# Patient Record
Sex: Female | Born: 1960 | Race: White | Hispanic: No | Marital: Married | Smoking: Never smoker
Health system: Southern US, Community
[De-identification: ages and names within clinical notes are randomized; demographics above are authoritative.]

## PROBLEM LIST (undated history)

## (undated) DIAGNOSIS — H8109 Meniere's disease, unspecified ear: Secondary | ICD-10-CM

## (undated) DIAGNOSIS — Z1379 Encounter for other screening for genetic and chromosomal anomalies: Principal | ICD-10-CM

## (undated) DIAGNOSIS — K529 Noninfective gastroenteritis and colitis, unspecified: Secondary | ICD-10-CM

## (undated) DIAGNOSIS — K859 Acute pancreatitis without necrosis or infection, unspecified: Secondary | ICD-10-CM

## (undated) DIAGNOSIS — C801 Malignant (primary) neoplasm, unspecified: Secondary | ICD-10-CM

## (undated) DIAGNOSIS — R197 Diarrhea, unspecified: Secondary | ICD-10-CM

## (undated) HISTORY — DX: Encounter for other screening for genetic and chromosomal anomalies: Z13.79

## (undated) HISTORY — PX: CERVICAL ABLATION: SHX5771

---

## 1997-08-24 HISTORY — PX: MANDIBLE SURGERY: SHX707

## 1997-12-24 ENCOUNTER — Other Ambulatory Visit: Admission: RE | Admit: 1997-12-24 | Discharge: 1997-12-24 | Payer: Self-pay

## 1998-01-01 ENCOUNTER — Ambulatory Visit (HOSPITAL_BASED_OUTPATIENT_CLINIC_OR_DEPARTMENT_OTHER): Admission: RE | Admit: 1998-01-01 | Discharge: 1998-01-01 | Payer: Self-pay | Admitting: Oral Surgery

## 1999-02-26 ENCOUNTER — Other Ambulatory Visit: Admission: RE | Admit: 1999-02-26 | Discharge: 1999-02-26 | Payer: Self-pay | Admitting: Obstetrics and Gynecology

## 1999-06-19 ENCOUNTER — Other Ambulatory Visit: Admission: RE | Admit: 1999-06-19 | Discharge: 1999-06-19 | Payer: Self-pay | Admitting: Obstetrics and Gynecology

## 1999-06-19 ENCOUNTER — Encounter (INDEPENDENT_AMBULATORY_CARE_PROVIDER_SITE_OTHER): Payer: Self-pay

## 1999-12-15 ENCOUNTER — Other Ambulatory Visit: Admission: RE | Admit: 1999-12-15 | Discharge: 1999-12-15 | Payer: Self-pay | Admitting: Obstetrics and Gynecology

## 2000-02-02 ENCOUNTER — Other Ambulatory Visit: Admission: RE | Admit: 2000-02-02 | Discharge: 2000-02-02 | Payer: Self-pay | Admitting: Obstetrics and Gynecology

## 2000-02-02 ENCOUNTER — Encounter (INDEPENDENT_AMBULATORY_CARE_PROVIDER_SITE_OTHER): Payer: Self-pay

## 2001-01-04 ENCOUNTER — Other Ambulatory Visit: Admission: RE | Admit: 2001-01-04 | Discharge: 2001-01-04 | Payer: Self-pay | Admitting: Obstetrics and Gynecology

## 2002-01-05 ENCOUNTER — Other Ambulatory Visit: Admission: RE | Admit: 2002-01-05 | Discharge: 2002-01-05 | Payer: Self-pay | Admitting: Obstetrics and Gynecology

## 2003-02-12 ENCOUNTER — Other Ambulatory Visit: Admission: RE | Admit: 2003-02-12 | Discharge: 2003-02-12 | Payer: Self-pay | Admitting: Obstetrics and Gynecology

## 2004-03-11 ENCOUNTER — Other Ambulatory Visit: Admission: RE | Admit: 2004-03-11 | Discharge: 2004-03-11 | Payer: Self-pay | Admitting: Obstetrics and Gynecology

## 2005-05-04 ENCOUNTER — Other Ambulatory Visit: Admission: RE | Admit: 2005-05-04 | Discharge: 2005-05-04 | Payer: Self-pay | Admitting: Obstetrics and Gynecology

## 2008-07-23 ENCOUNTER — Encounter: Admission: RE | Admit: 2008-07-23 | Discharge: 2008-07-23 | Payer: Self-pay | Admitting: Obstetrics and Gynecology

## 2009-08-24 HISTORY — PX: ENDOMETRIAL ABLATION: SHX621

## 2009-09-26 ENCOUNTER — Encounter: Admission: RE | Admit: 2009-09-26 | Discharge: 2009-09-26 | Payer: Self-pay | Admitting: Obstetrics and Gynecology

## 2009-12-27 ENCOUNTER — Ambulatory Visit (HOSPITAL_COMMUNITY): Admission: RE | Admit: 2009-12-27 | Discharge: 2009-12-27 | Payer: Self-pay | Admitting: Obstetrics and Gynecology

## 2010-09-14 ENCOUNTER — Encounter: Payer: Self-pay | Admitting: Obstetrics and Gynecology

## 2010-11-11 LAB — BASIC METABOLIC PANEL
Chloride: 106 mEq/L (ref 96–112)
Creatinine, Ser: 0.79 mg/dL (ref 0.4–1.2)
Potassium: 3.7 mEq/L (ref 3.5–5.1)
Sodium: 139 mEq/L (ref 135–145)

## 2010-11-11 LAB — CBC
HCT: 37.7 % (ref 36.0–46.0)
MCHC: 33.9 g/dL (ref 30.0–36.0)
RDW: 16 % — ABNORMAL HIGH (ref 11.5–15.5)

## 2011-11-03 ENCOUNTER — Other Ambulatory Visit: Payer: Self-pay | Admitting: Obstetrics and Gynecology

## 2011-11-03 DIAGNOSIS — N6320 Unspecified lump in the left breast, unspecified quadrant: Secondary | ICD-10-CM

## 2011-11-10 ENCOUNTER — Ambulatory Visit
Admission: RE | Admit: 2011-11-10 | Discharge: 2011-11-10 | Disposition: A | Discharge status: Home / Self Care | Payer: BC Managed Care – PPO | Source: Ambulatory Visit | Attending: Obstetrics and Gynecology | Admitting: Obstetrics and Gynecology

## 2011-11-10 DIAGNOSIS — N6320 Unspecified lump in the left breast, unspecified quadrant: Secondary | ICD-10-CM

## 2013-01-13 ENCOUNTER — Other Ambulatory Visit: Payer: Self-pay

## 2013-01-13 DIAGNOSIS — Z1231 Encounter for screening mammogram for malignant neoplasm of breast: Secondary | ICD-10-CM

## 2013-01-26 ENCOUNTER — Ambulatory Visit
Admission: RE | Admit: 2013-01-26 | Discharge: 2013-01-26 | Disposition: A | Discharge status: Home / Self Care | Payer: BC Managed Care – PPO | Source: Ambulatory Visit

## 2013-01-26 DIAGNOSIS — Z1231 Encounter for screening mammogram for malignant neoplasm of breast: Secondary | ICD-10-CM

## 2013-03-01 ENCOUNTER — Other Ambulatory Visit: Payer: Self-pay | Admitting: Dermatology

## 2013-03-29 ENCOUNTER — Other Ambulatory Visit: Payer: Self-pay | Admitting: Dermatology

## 2014-01-12 ENCOUNTER — Other Ambulatory Visit: Payer: Self-pay | Admitting: Family Medicine

## 2014-01-12 ENCOUNTER — Other Ambulatory Visit (HOSPITAL_COMMUNITY)
Admission: RE | Admit: 2014-01-12 | Discharge: 2014-01-12 | Disposition: A | Discharge status: Home / Self Care | Payer: BC Managed Care – PPO | Source: Ambulatory Visit | Attending: Family Medicine | Admitting: Family Medicine

## 2014-01-12 DIAGNOSIS — Z124 Encounter for screening for malignant neoplasm of cervix: Secondary | ICD-10-CM | POA: Insufficient documentation

## 2014-01-12 DIAGNOSIS — Z1151 Encounter for screening for human papillomavirus (HPV): Secondary | ICD-10-CM | POA: Insufficient documentation

## 2014-08-24 DIAGNOSIS — H8109 Meniere's disease, unspecified ear: Secondary | ICD-10-CM

## 2014-08-24 HISTORY — DX: Meniere's disease, unspecified ear: H81.09

## 2015-01-16 ENCOUNTER — Other Ambulatory Visit: Payer: Self-pay

## 2015-01-16 DIAGNOSIS — Z1231 Encounter for screening mammogram for malignant neoplasm of breast: Secondary | ICD-10-CM

## 2015-01-24 ENCOUNTER — Ambulatory Visit
Admission: RE | Admit: 2015-01-24 | Discharge: 2015-01-24 | Disposition: A | Discharge status: Home / Self Care | Payer: BLUE CROSS/BLUE SHIELD | Source: Ambulatory Visit

## 2015-01-24 ENCOUNTER — Encounter (INDEPENDENT_AMBULATORY_CARE_PROVIDER_SITE_OTHER): Payer: Self-pay

## 2015-01-24 DIAGNOSIS — Z1231 Encounter for screening mammogram for malignant neoplasm of breast: Secondary | ICD-10-CM

## 2015-07-30 ENCOUNTER — Other Ambulatory Visit: Payer: Self-pay | Admitting: Otolaryngology

## 2015-07-30 DIAGNOSIS — R42 Dizziness and giddiness: Secondary | ICD-10-CM

## 2015-07-30 DIAGNOSIS — H9311 Tinnitus, right ear: Secondary | ICD-10-CM

## 2015-07-30 DIAGNOSIS — H905 Unspecified sensorineural hearing loss: Secondary | ICD-10-CM

## 2015-07-30 DIAGNOSIS — H908 Mixed conductive and sensorineural hearing loss, unspecified: Secondary | ICD-10-CM

## 2015-07-30 DIAGNOSIS — H903 Sensorineural hearing loss, bilateral: Secondary | ICD-10-CM

## 2015-08-13 ENCOUNTER — Ambulatory Visit
Admission: RE | Admit: 2015-08-13 | Discharge: 2015-08-13 | Disposition: A | Discharge status: Home / Self Care | Payer: BLUE CROSS/BLUE SHIELD | Source: Ambulatory Visit | Attending: Otolaryngology | Admitting: Otolaryngology

## 2015-08-13 DIAGNOSIS — R42 Dizziness and giddiness: Secondary | ICD-10-CM

## 2015-08-13 DIAGNOSIS — H9311 Tinnitus, right ear: Secondary | ICD-10-CM

## 2015-08-13 DIAGNOSIS — H903 Sensorineural hearing loss, bilateral: Secondary | ICD-10-CM

## 2015-08-13 DIAGNOSIS — H908 Mixed conductive and sensorineural hearing loss, unspecified: Secondary | ICD-10-CM

## 2015-08-13 DIAGNOSIS — H905 Unspecified sensorineural hearing loss: Secondary | ICD-10-CM

## 2015-08-13 MED ORDER — GADOBENATE DIMEGLUMINE 529 MG/ML IV SOLN
13.0000 mL | Freq: Once | INTRAVENOUS | Status: AC | PRN
  Administered 2015-08-13: 13 mL via INTRAVENOUS

## 2015-10-21 DIAGNOSIS — IMO0001 Reserved for inherently not codable concepts without codable children: Secondary | ICD-10-CM | POA: Insufficient documentation

## 2015-10-21 DIAGNOSIS — H918X1 Other specified hearing loss, right ear: Secondary | ICD-10-CM | POA: Insufficient documentation

## 2015-10-21 DIAGNOSIS — H8101 Meniere's disease, right ear: Secondary | ICD-10-CM | POA: Diagnosis present

## 2015-10-21 DIAGNOSIS — R42 Dizziness and giddiness: Secondary | ICD-10-CM

## 2016-03-03 ENCOUNTER — Other Ambulatory Visit: Payer: Self-pay | Admitting: Family Medicine

## 2016-03-03 DIAGNOSIS — Z1231 Encounter for screening mammogram for malignant neoplasm of breast: Secondary | ICD-10-CM

## 2016-03-11 ENCOUNTER — Ambulatory Visit
Admission: RE | Admit: 2016-03-11 | Discharge: 2016-03-11 | Disposition: A | Discharge status: Home / Self Care | Payer: BLUE CROSS/BLUE SHIELD | Source: Ambulatory Visit | Attending: Family Medicine | Admitting: Family Medicine

## 2016-03-11 DIAGNOSIS — Z1231 Encounter for screening mammogram for malignant neoplasm of breast: Secondary | ICD-10-CM

## 2016-10-02 DIAGNOSIS — H918X1 Other specified hearing loss, right ear: Secondary | ICD-10-CM | POA: Diagnosis not present

## 2016-10-02 DIAGNOSIS — H8101 Meniere's disease, right ear: Secondary | ICD-10-CM | POA: Diagnosis not present

## 2016-10-02 DIAGNOSIS — H903 Sensorineural hearing loss, bilateral: Secondary | ICD-10-CM | POA: Diagnosis not present

## 2016-10-26 DIAGNOSIS — M545 Low back pain: Secondary | ICD-10-CM | POA: Diagnosis not present

## 2016-12-13 DIAGNOSIS — D1803 Hemangioma of intra-abdominal structures: Secondary | ICD-10-CM

## 2017-03-10 ENCOUNTER — Other Ambulatory Visit (HOSPITAL_COMMUNITY)
Admission: RE | Admit: 2017-03-10 | Discharge: 2017-03-10 | Disposition: A | Discharge status: Home / Self Care | Payer: 59 | Source: Ambulatory Visit | Attending: Family Medicine | Admitting: Family Medicine

## 2017-03-10 ENCOUNTER — Other Ambulatory Visit: Payer: Self-pay | Admitting: Family Medicine

## 2017-03-10 DIAGNOSIS — Z5181 Encounter for therapeutic drug level monitoring: Secondary | ICD-10-CM | POA: Diagnosis not present

## 2017-03-10 DIAGNOSIS — Z1151 Encounter for screening for human papillomavirus (HPV): Secondary | ICD-10-CM | POA: Insufficient documentation

## 2017-03-10 DIAGNOSIS — Z01411 Encounter for gynecological examination (general) (routine) with abnormal findings: Secondary | ICD-10-CM | POA: Diagnosis not present

## 2017-03-10 DIAGNOSIS — E785 Hyperlipidemia, unspecified: Secondary | ICD-10-CM | POA: Diagnosis not present

## 2017-03-10 DIAGNOSIS — Z0000 Encounter for general adult medical examination without abnormal findings: Secondary | ICD-10-CM | POA: Diagnosis not present

## 2017-03-12 LAB — CYTOLOGY - PAP
DIAGNOSIS: NEGATIVE
HPV (WINDOPATH): NOT DETECTED

## 2017-03-18 ENCOUNTER — Other Ambulatory Visit: Payer: Self-pay | Admitting: Family Medicine

## 2017-03-18 DIAGNOSIS — Z1231 Encounter for screening mammogram for malignant neoplasm of breast: Secondary | ICD-10-CM

## 2017-03-25 ENCOUNTER — Ambulatory Visit
Admission: RE | Admit: 2017-03-25 | Discharge: 2017-03-25 | Disposition: A | Discharge status: Home / Self Care | Payer: 59 | Source: Ambulatory Visit | Attending: Family Medicine | Admitting: Family Medicine

## 2017-03-25 DIAGNOSIS — Z1231 Encounter for screening mammogram for malignant neoplasm of breast: Secondary | ICD-10-CM | POA: Diagnosis not present

## 2017-03-30 ENCOUNTER — Other Ambulatory Visit: Payer: Self-pay | Admitting: Family Medicine

## 2017-03-30 DIAGNOSIS — R928 Other abnormal and inconclusive findings on diagnostic imaging of breast: Secondary | ICD-10-CM

## 2017-04-01 ENCOUNTER — Ambulatory Visit
Admission: RE | Admit: 2017-04-01 | Discharge: 2017-04-01 | Disposition: A | Discharge status: Home / Self Care | Payer: 59 | Source: Ambulatory Visit | Attending: Family Medicine | Admitting: Family Medicine

## 2017-04-01 ENCOUNTER — Other Ambulatory Visit: Payer: Self-pay | Admitting: Family Medicine

## 2017-04-01 DIAGNOSIS — R928 Other abnormal and inconclusive findings on diagnostic imaging of breast: Secondary | ICD-10-CM

## 2017-04-01 DIAGNOSIS — R921 Mammographic calcification found on diagnostic imaging of breast: Secondary | ICD-10-CM

## 2017-04-01 DIAGNOSIS — N6489 Other specified disorders of breast: Secondary | ICD-10-CM | POA: Diagnosis not present

## 2017-04-01 DIAGNOSIS — R922 Inconclusive mammogram: Secondary | ICD-10-CM | POA: Diagnosis not present

## 2017-05-06 DIAGNOSIS — D2272 Melanocytic nevi of left lower limb, including hip: Secondary | ICD-10-CM | POA: Diagnosis not present

## 2017-05-06 DIAGNOSIS — Z86018 Personal history of other benign neoplasm: Secondary | ICD-10-CM | POA: Diagnosis not present

## 2017-05-06 DIAGNOSIS — L814 Other melanin hyperpigmentation: Secondary | ICD-10-CM | POA: Diagnosis not present

## 2017-07-20 DIAGNOSIS — R945 Abnormal results of liver function studies: Secondary | ICD-10-CM | POA: Diagnosis not present

## 2017-07-20 DIAGNOSIS — R1013 Epigastric pain: Secondary | ICD-10-CM | POA: Diagnosis not present

## 2017-07-21 ENCOUNTER — Other Ambulatory Visit: Payer: Self-pay | Admitting: Family Medicine

## 2017-07-21 DIAGNOSIS — R1011 Right upper quadrant pain: Secondary | ICD-10-CM

## 2017-07-22 ENCOUNTER — Other Ambulatory Visit: Payer: 59

## 2017-07-23 ENCOUNTER — Ambulatory Visit
Admission: RE | Admit: 2017-07-23 | Discharge: 2017-07-23 | Disposition: A | Discharge status: Home / Self Care | Payer: 59 | Source: Ambulatory Visit | Attending: Family Medicine | Admitting: Family Medicine

## 2017-07-23 ENCOUNTER — Other Ambulatory Visit: Payer: Self-pay | Admitting: Family Medicine

## 2017-07-23 DIAGNOSIS — R748 Abnormal levels of other serum enzymes: Secondary | ICD-10-CM

## 2017-07-23 DIAGNOSIS — R1013 Epigastric pain: Secondary | ICD-10-CM | POA: Diagnosis not present

## 2017-07-23 DIAGNOSIS — K8020 Calculus of gallbladder without cholecystitis without obstruction: Secondary | ICD-10-CM | POA: Diagnosis not present

## 2017-07-23 DIAGNOSIS — R160 Hepatomegaly, not elsewhere classified: Secondary | ICD-10-CM

## 2017-07-23 DIAGNOSIS — R1011 Right upper quadrant pain: Secondary | ICD-10-CM

## 2017-07-23 MED ORDER — IOPAMIDOL (ISOVUE-300) INJECTION 61%
100.0000 mL | Freq: Once | INTRAVENOUS | Status: AC | PRN
  Administered 2017-07-23: 100 mL via INTRAVENOUS

## 2017-07-27 ENCOUNTER — Other Ambulatory Visit (HOSPITAL_COMMUNITY): Payer: Self-pay | Admitting: Family Medicine

## 2017-07-27 DIAGNOSIS — K8020 Calculus of gallbladder without cholecystitis without obstruction: Secondary | ICD-10-CM | POA: Diagnosis not present

## 2017-07-27 DIAGNOSIS — R160 Hepatomegaly, not elsewhere classified: Secondary | ICD-10-CM

## 2017-07-27 DIAGNOSIS — C799 Secondary malignant neoplasm of unspecified site: Secondary | ICD-10-CM

## 2017-07-27 DIAGNOSIS — K859 Acute pancreatitis without necrosis or infection, unspecified: Secondary | ICD-10-CM | POA: Diagnosis not present

## 2017-07-28 ENCOUNTER — Telehealth: Payer: Self-pay | Admitting: Hematology

## 2017-07-28 NOTE — Telephone Encounter (Signed)
Appt has been scheduled for the pt to see Dr. Burr Medico on 12/7 at 230pm. Pt aware to arrive 30 minutes early to be checked in on time. Voiced understanding and has agreed to the appt date and time.

## 2017-07-29 ENCOUNTER — Encounter (HOSPITAL_COMMUNITY)
Admission: RE | Admit: 2017-07-29 | Discharge: 2017-07-29 | Disposition: A | Discharge status: Home / Self Care | Payer: 59 | Source: Ambulatory Visit | Attending: Family Medicine | Admitting: Family Medicine

## 2017-07-29 DIAGNOSIS — R160 Hepatomegaly, not elsewhere classified: Secondary | ICD-10-CM | POA: Diagnosis not present

## 2017-07-29 DIAGNOSIS — C259 Malignant neoplasm of pancreas, unspecified: Secondary | ICD-10-CM | POA: Insufficient documentation

## 2017-07-29 DIAGNOSIS — C799 Secondary malignant neoplasm of unspecified site: Secondary | ICD-10-CM | POA: Insufficient documentation

## 2017-07-29 DIAGNOSIS — C787 Secondary malignant neoplasm of liver and intrahepatic bile duct: Secondary | ICD-10-CM | POA: Diagnosis not present

## 2017-07-29 LAB — GLUCOSE, CAPILLARY: GLUCOSE-CAPILLARY: 117 mg/dL — AB (ref 65–99)

## 2017-07-29 MED ORDER — FLUDEOXYGLUCOSE F - 18 (FDG) INJECTION
6.5000 | Freq: Once | INTRAVENOUS | Status: AC | PRN
  Administered 2017-07-29: 6.5 via INTRAVENOUS

## 2017-07-30 ENCOUNTER — Telehealth: Payer: Self-pay | Admitting: Hematology

## 2017-07-30 ENCOUNTER — Ambulatory Visit (HOSPITAL_BASED_OUTPATIENT_CLINIC_OR_DEPARTMENT_OTHER): Payer: 59 | Admitting: Hematology

## 2017-07-30 ENCOUNTER — Encounter: Payer: Self-pay | Admitting: General Practice

## 2017-07-30 ENCOUNTER — Encounter: Payer: Self-pay | Admitting: Hematology

## 2017-07-30 DIAGNOSIS — H8109 Meniere's disease, unspecified ear: Secondary | ICD-10-CM | POA: Diagnosis not present

## 2017-07-30 DIAGNOSIS — R21 Rash and other nonspecific skin eruption: Secondary | ICD-10-CM | POA: Diagnosis not present

## 2017-07-30 DIAGNOSIS — K859 Acute pancreatitis without necrosis or infection, unspecified: Secondary | ICD-10-CM | POA: Diagnosis not present

## 2017-07-30 DIAGNOSIS — Z807 Family history of other malignant neoplasms of lymphoid, hematopoietic and related tissues: Secondary | ICD-10-CM

## 2017-07-30 DIAGNOSIS — C787 Secondary malignant neoplasm of liver and intrahepatic bile duct: Secondary | ICD-10-CM

## 2017-07-30 DIAGNOSIS — Z800 Family history of malignant neoplasm of digestive organs: Secondary | ICD-10-CM | POA: Diagnosis not present

## 2017-07-30 DIAGNOSIS — C801 Malignant (primary) neoplasm, unspecified: Secondary | ICD-10-CM

## 2017-07-30 NOTE — Progress Notes (Signed)
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 Cholelithiasis.  No secondary signs of acute cholecystitis. 2. Multiple solid liver masses measuring up to 6.5 cm in the left lobe of the liver, evaluation for metastatic disease is recommended. These results will be called to the ordering clinician or representative by the Radiologist Assistant, and communication documented in the PACS or zVision Dashboard.      07/23/2017 Imaging    CT Abdomen W Contrast 07/23/17 IMPRESSION: 1. Widespread metastatic disease throughout the liver. No clear primary malignancy identified in the abdomen. The pelvis was not imaged. Tissue sampling recommended. 2. Probable adenopathy superior to the pancreatic tail. No evidence of pancreatic mass. 3. Suspected incidental hemangioma inferiorly in the right hepatic lobe. 4. Nonspecific nodularity in the breasts. The patient has undergone recent (03/25/2017 and 04/01/2017) mammography and ultrasound.      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 Numerous bulky liver masses are hypermetabolic compatible with malignancy. 2. Accentuated activity within or along the pancreatic tail. I suspect that this is probably a physiologic activity in the distal duodenum which is being is registered onto the pancreatic tail. I do not see an obvious pancreatic lesion on the CT scan. Consider pancreatic protocol MRI to further work this up. 3. The peripancreatic lymph node shown  above the pancreatic tail is mildly hypermetabolic favoring malignancy. 4.  Prominent stool throughout the colon favors constipation. 5. 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        HISTORY OF PRESENTING ILLNESS: 07/30/17  Morgan Dawson 56 y.o. 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 The patient was referred by her PCP Dr. [name]. The patient presents to the clinic today accompanied by husband.  Today the patient reports the weekend before Thanksgiving she had abdominal issues with bloating and cramps. These symptoms worsened after she would eat. This persisted past Thanksgiving so she went to her PCP.  She presented to her PCP on 07/20/17 for upper abdominal pain, nausea and bloating. She had lab work up and RUQ [place] on 07/23/17. Results showed elevated LFTs (alk phos 227, AST 191, ALT 275) and US showed multiple masses in her liver and pancreatitis. She was put on a bland die due to her pancreatitis. CT AP in 07/23/17 showed diffuse metastatic disease in her liver with no clear primary. She had a PET scan that shows possible metastasis to peripancreatic lymph node along with her known liver masses.   Today she notes she has focused soreness in RUQ. She feels nauseous and has vomited 3 times total in the past 3 weeks. She has lost weight, initially purposefully. She has lost 5-10 pounds. She has been constipated  lately. She takes dulcolax to help. She denies dark stool or GI bleeding. No hematemesis. She does feel more fatigued. She was relatively active 4-5 days a week before last month. She has not needed medication as she feels she has more  discomfort (1-2/10). She has not been exercising because she is afraid of making things worse. Her appetite is low from her discomfort and nausea. She notes multiple tender knots on her lower legs that appeared 5 nights ago. There is no itching. She notes she will follow up with Dr. Paulita Fujita this month for  her pancreatitis.   Her last mammogram at the [facility] was 04/01/17 and mostly benign. She was recommended to repeat in 6 months. She notes she frequently has cyst in her breast which were benign before.  Socially she is not working. She usually is active in the gym 3-4 days a week. She has 2 children (son and daughter) at 54yo and 57yo.   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 Her last episode of vertigo was a few months ago. She takes a diuretic and a low salt diet. She has jaw surgery for TMJ issues. She had endometrial ablation due to menorrhagia in 2011. She has only spotted since, no full period. Patient had a colonoscopy in 2014 with one benign polyp removed. Her father had colon cancer in his 59s. Her maternal grandfather had colon cancer in his 50s. Paternal aunt had lymphoma.   MEDICAL HISTORY:  History reviewed. No pertinent past medical history.  SURGICAL HISTORY: Past Surgical History:  Procedure Laterality Date  . ENDOMETRIAL ABLATION  2011  . MANDIBLE SURGERY  1999    SOCIAL HISTORY: Social History   Socioeconomic History  . Marital status: Married    Spouse name: Not on file  . Number of children: Not on file  . Years of education: Not on file  . Highest education level: Not on file  Social Needs  . Financial resource strain: Not on file  . Food insecurity - worry: Not on file  . Food insecurity - inability: Not on file  . Transportation needs - medical: Not on file  . Transportation needs - non-medical: Not on file  Occupational History  . Not on file  Tobacco Use  . Smoking status: Never Smoker  . Smokeless tobacco: Never Used  Substance and Sexual Activity  . Alcohol use: No    Frequency: Never  . Drug use: Not on file  . Sexual activity: Not on file  Other Topics Concern  . Not on file  Social History Narrative  . Not on file    FAMILY HISTORY: Family History  Problem Relation Age of Onset  . Cancer Father 39       colon cancer    . Cancer Maternal Aunt        lymphoma   . Cancer Maternal Grandfather 53       colon cancer   . Cancer Cousin        breast cancer     ALLERGIES:  has No Known Allergies.  MEDICATIONS:  Current Outpatient Medications  Medication Sig Dispense Refill  . ondansetron (ZOFRAN-ODT) 8 MG disintegrating tablet DIS 1 T ON THE TONGUE BID FOR 5 DAYS PRF NAUSEA  1  . triamterene-hydrochlorothiazide (DYAZIDE) 37.5-25 MG capsule TAKE 1 CAPSULE BY MOUTH  DAILY    . Acetaminophen-Codeine 300-30 MG tablet     . Meclizine HCl 25 MG CHEW Chew 25 mg by mouth as needed.     Marland Kitchen omeprazole (PRILOSEC) 40 MG capsule TAKE 1 CAPSULE ONCE A DAY IN THE MORNING  11  . XANAX 0.25 MG tablet Take 0.25 mg by mouth as needed.      No current facility-administered medications  for this visit.     REVIEW OF SYSTEMS:   Constitutional: Denies fevers, chills or abnormal night sweats (+) fatigue  (+) low appetite (+) weight loss Eyes: Denies blurriness of vision, double vision or watery eyes Ears, nose, mouth, throat, and face: Denies mucositis or sore throat Respiratory: Denies cough, dyspnea or wheezes Cardiovascular: Denies palpitation, chest discomfort or lower extremity swelling Gastrointestinal:  (+) Pain/discomfort in RUQ (1-2/10) (+) nausea (+) occasional emesis (+) constipation Skin: Denies abnormal skin rashes (+) tender knots down lower legs bilaterally Lymphatics: Denies new lymphadenopathy or easy bruising Neurological:Denies numbness, tingling or new weaknesses Behavioral/Psych: Mood is stable, no new changes  All other systems were reviewed with the patient and are negative.  PHYSICAL EXAMINATION: ECOG PERFORMANCE STATUS: 1 - Symptomatic but completely ambulatory  Vitals:   07/30/17 1428  BP: 134/82  Pulse: 87  Resp: 18  Temp: 97.9 F (36.6 C)  SpO2: 100%   Filed Weights   07/30/17 1428  Weight: 137 lb 14.4 oz (62.6 kg)    GENERAL:alert, no distress and comfortable SKIN: skin color,  texture, turgor are normal, no rashes or significant lesions EYES: normal, conjunctiva are pink and non-injected, sclera clear OROPHARYNX:no exudate, no erythema and lips, buccal mucosa, and tongue normal  NECK: supple, thyroid normal size, non-tender, without nodularity LYMPH:  no palpable lymphadenopathy in the cervical, axillary or inguinal LUNGS: clear to auscultation and percussion with normal breathing effort HEART: regular rate & rhythm and no murmurs and no lower extremity edema ABDOMEN:abdomen soft, normal bowel sounds (+) hepatomegaly about 3cm below ribcage with tenderness on palpation  Musculoskeletal:no cyanosis of digits and no clubbing  PSYCH: alert & oriented x 3 with fluent speech NEURO: no focal motor/sensory deficits Breasts: Breast inspection showed them to be symmetrical with no nipple discharge. Palpation of the breasts and axilla revealed no obvious mass that I could appreciate.  LABORATORY DATA:  I have reviewed the data as listed CBC Latest Ref Rng & Units 12/25/2009  WBC 4.0 - 10.5 K/uL 5.9  Hemoglobin 12.0 - 15.0 g/dL 12.8  Hematocrit 36.0 - 46.0 % 37.7  Platelets 150 - 400 K/uL 292    CMP Latest Ref Rng & Units 12/25/2009  Glucose 70 - 99 mg/dL 98  BUN 6 - 23 mg/dL 16  Creatinine 0.4 - 1.2 mg/dL 0.79  Sodium 135 - 145 mEq/L 139  Potassium 3.5 - 5.1 mEq/L 3.7  Chloride 96 - 112 mEq/L 106  CO2 19 - 32 mEq/L 27  Calcium 8.4 - 10.5 mg/dL 9.5   Her recent outside lab (07/20/2017) WBC 7.7, hemoglobin 14.4, hematocrit 43%, platelets 374K, MCV 91.8 BUN 17, creatinine 0.88, GFR 67, sodium 136, potassium 3.3, calcium 11.0, total protein 7.8, albumin 4.4, total bilirubin 0.7, ALP 227, AST 191, ALT 275,  lipase 2969  PATHOLOGY  Diagnosis 03/07/13 Surgical, cecum, polyp -TUBULAR ADENOMA -NEGATIVE FOR HIGH GRADE DYSPLASIA   PROCEDURES  Colonoscopy by Dr. Paulita Fujita 03/07/13 IMPRESSION:  -One 5 m polyp in the cecum. Resected and retrieved -The distal rectum and  anal verge are normal on retroflexion view.  -The examination was otherwise normal.    RADIOGRAPHIC STUDIES: I have personally reviewed the radiological images as listed and agreed with the findings in the report. Nm Pet Image Initial (pi) Skull Base To Thigh  Result Date: 07/29/2017 CLINICAL DATA:  Initial treatment strategy for metastatic masses in the liver, primary uncertain. EXAM: NUCLEAR MEDICINE PET SKULL BASE TO THIGH TECHNIQUE: 6.5 mCi F-18 FDG was injected intravenously.  Full-ring PET imaging was performed from the skull base to thigh after the radiotracer. CT data was obtained and used for attenuation correction and anatomic localization. FASTING BLOOD GLUCOSE:  Value: 117 mg/dl COMPARISON:  CT scan 09/22/2016 FINDINGS: NECK No hypermetabolic lymph nodes in the neck. Several small hypodense thyroid nodules do not require further workup. CHEST No hypermetabolic mediastinal or hilar nodes. No suspicious pulmonary nodules on the CT data. Mild biapical pleuroparenchymal scarring. ABDOMEN/PELVIS Replacement of much of the liver by hypermetabolic masses with hypermetabolic activity ranging up to 9.8. An index 6.0 by 0.3 cm lateral segment left hepatic lobe mass on image 101/4 has a maximum SUV of 7.4. Peripancreatic node just above the pancreatic tail measures 1.3 cm in short axis and has a maximum standard uptake value of 4.4. There is some activity projecting over the junction of the pancreatic body and tail with maximum SUV 6.3, but on the PET images this seems connected to the third portion of the duodenum and [place] may represent bowel activity is registered along the pancreas rather than a true pancreatic lesion. The spleen and adrenal glands appear normal. No additional hypermetabolic adenopathy is identified. More caudad, the bowel appears normal. Prominent stool throughout the colon favors constipation. 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 No significant abnormal osseous activity.  IMPRESSION: 1. Numerous bulky liver masses are hypermetabolic compatible with malignancy. 2. Accentuated activity within or along the pancreatic tail. I suspect that this is probably a physiologic activity in the distal duodenum which is being is registered onto the pancreatic tail. I do not see an obvious pancreatic lesion on the CT scan. Consider pancreatic protocol MRI to further work this up. 3. The peripancreatic lymph node shown above the pancreatic tail is mildly hypermetabolic favoring malignancy. 4.  Prominent stool throughout the colon favors constipation. 5. 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 Electronically Signed   By: Van Clines M.D.   On: 07/29/2017 11:39   Ct Abdomen W Wo Contrast  Result Date: 07/23/2017 CLINICAL DATA:  Epigastric pain with abdominal bloating and nausea for 2 weeks. No history of malignancy. Multiple liver lesions on ultrasound. EXAM: CT ABDOMEN WITHOUT AND WITH CONTRAST TECHNIQUE: Multidetector CT imaging of the abdomen was performed following the standard protocol before and following the bolus administration of intravenous contrast. CONTRAST:  161m ISOVUE-300 IOPAMIDOL (ISOVUE-300) INJECTION 61% COMPARISON:  Ultrasound same date. FINDINGS: Lower chest: Clear lung bases. No significant pleural or pericardial effusion. Hepatobiliary: The liver is enlarged by multiple heterogeneously enhancing masses. There is one lesion inferiorly in the right hepatic lobe (segment 5) which is probably a hemangioma, measuring 4.2 x 3.2 cm on image 75 of series 9. However, there are numerous other enhancing lesions which are most consistent with metastatic disease. These include a 5.3 x 7.8 cm lesion in segment 4A (image 32) and a 4.9 x 5.8 cm lesion in segment 6 (image 66). No definite morphologic changes of cirrhosis. Probable small gallstones or polyps. No gallbladder wall thickening or biliary dilatation. Pancreas: No intrinsic pancreatic lesions are identified. There is no  pancreatic ductal dilatation or surrounding inflammation. Spleen: Normal in size without focal abnormality. Adrenals/Urinary Tract: Both adrenal glands appear normal. The kidneys appear normal without evidence of urinary tract calculus or hydronephrosis. Bladder not imaged. Stomach/Bowel: No evidence of bowel wall thickening, distention or surrounding inflammatory change.The pelvis was not imaged. Vascular/Lymphatic: 1.5 x 1.3 cm soft tissue nodule superior to the pancreatic tail on image 40 of series 9 is probably a lymph  node. There are small lymph nodes the porta hepatis. No enlarged retroperitoneal lymph nodes are seen. No significant vascular findings. The portal, superior mesenteric and splenic veins are patent. Other: No ascites or peritoneal nodularity. Musculoskeletal: No acute or significant osseous findings. There is nodularity within the visualized breasts without definite enhancement. IMPRESSION: 1. Widespread metastatic disease throughout the liver. No clear primary malignancy identified in the abdomen. The pelvis was not imaged. Tissue sampling recommended. 2. Probable adenopathy superior to the pancreatic tail. No evidence of pancreatic mass. 3. Suspected incidental hemangioma inferiorly in the right hepatic lobe. 4. Nonspecific nodularity in the breasts. The patient has undergone recent (03/25/2017 and 04/01/2017) mammography and ultrasound. Electronically Signed   By: Richardean Sale M.D.   On: 07/23/2017 17:47   US Abdomen Limited Ruq  Result Date: 07/23/2017 CLINICAL DATA:  56 y/o F; postprandial right upper quadrant abdominal pain for 2 weeks. Transaminitis. EXAM: ULTRASOUND ABDOMEN LIMITED RIGHT UPPER QUADRANT COMPARISON:  None. FINDINGS: Gallbladder: No gallbladder wall thickening or pericholecystic fluid. Negative sonographic Murphy's sign. Multiple gallstones measuring up to 7 mm including a stone at the gallbladder neck. Common bile duct: Diameter: 2.4 mm Liver: Multiple solid masses  in the liver measuring up to 6.5 cm in the left lobe and in the right lobe measuring up to 5.1 and 3.7 cm. Portal vein is patent on color Doppler imaging with normal direction of blood flow towards the liver. IMPRESSION: 1. Cholelithiasis.  No secondary signs of acute cholecystitis. 2. Multiple solid liver masses measuring up to 6.5 cm in the left lobe of the liver, evaluation for metastatic disease is recommended. These results will be called to the ordering clinician or representative by the Radiologist Assistant, and communication documented in the PACS or zVision Dashboard. Electronically Signed   By: Kristine Garbe M.D.   On: 07/23/2017 14:35     ASSESSMENT & PLAN:  NOELE ICENHOUR is a 56 y.o. caucasian female with a history of vertigo and menieres disease, presented with epigastric discomfort, low appetite and 5 pound weight loss   1. 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  07/29/17 PET scan shows diffuse metastatic disease in her enlarged liver.  This is most consistent with diffuse liver metastasis.  PET scan showed no other significant uptake anywhere else in the body. The primary site is unknown.  -Pt has no known liver disease, does not drink alcohol, no liver cirrhosis on the image, this is unlikely hepatocellular carcinoma.  Cholangiocarcinoma with liver metastasis is a possibility.   -I recommend a liver biopsy to diagnose and find primary tumor.  Potential side effects from biopsy discussed.  She agreed. -If path findings is not certain about primary, I would recommend GI work up including EGD and colonoscopy.  Did have previous colonoscopy 4 years ago, which showed a polyp. -Once primary is known we can start treatment plan. Given her metastatic disease this is not curable. Goal of treatment is to control her disease and prolong her life.  -Will check tumor marker Ca19-9  and CEA for baseline today.  -If biopsy confirm malignancy, will arrange a PAC placed and have a chemo class soon.  -I advised that she is fine to go to the gym for moderate to low intensity exercise. I encouraged her to increase her protein and carbohydrates in her body to prevent more weight loss. I also encouraged her to stay  positive.  -I advised her to avoid significant alcohol intake and aspirin. She is fine to take tylenol as needed.  -F/u in 1-2 weeks, a few days after biopsy  -Her abdominal discomfort is mild, no need for pain medication.  2. Pancreatitis -Found by 07/23/17 CT scan  -On bland diet -I suggest Prilosec and to continue small meals with bland diet. If needed I can give pain medication. -Will follow up with Dr. Paulita Fujita next week    3. Multiple skin rash on lower legs -started a few days ago, tender but no itchiness  -I suggest she start with benadryl and OTC hydrocortone  -Will monitor.    PLAN:  -IR Liver biopsy within 1 week -Labs today or next week  -f/u in 1-2 weeks    Orders Placed This Encounter  Procedures  . US BIOPSY (LIVER)    Standing Status:   Future    Standing Expiration Date:   09/30/2018    Scheduling Instructions:     Please schedule ASAP    Order Specific Question:   Lab orders requested (DO NOT place separate lab orders, these will be automatically ordered during procedure specimen collection):    Answer:   Surgical Pathology    Order Specific Question:   Reason for Exam (SYMPTOM  OR DIAGNOSIS REQUIRED)    Answer:   liver mass, needs tissue diagnosis and tissue for molecular testings    Order Specific Question:   Preferred imaging location?    Answer:   [place] 19.9    Standing Status:   Future    Standing Expiration Date:   07/30/2018  . CEA    Standing Status:   Future    Standing Expiration Date:   07/30/2018  . Comprehensive metabolic panel    Standing Status:   Future    Standing Expiration Date:   07/30/2018     All questions were answered. The patient knows to call the clinic with any problems, questions or concerns. I spent 55 minutes counseling the patient face to face. The total time spent in the appointment was 60 minutes and more than 50% was on counseling.     [name], MD 07/30/2017 8:45 PM   This document serves as a record of services personally performed by [name], MD. It was created on her behalf by Joslyn Devon, a trained medical scribe. The creation of this record is based on the scribe's personal observations and the provider's statements to them.   I have reviewed the above documentation for accuracy and completeness, and I agree with the above.

## 2017-07-30 NOTE — Telephone Encounter (Signed)
No additional appts to schedule - [facility] to contact patient with Biopsy appt.

## 2017-07-30 NOTE — Progress Notes (Signed)
[facility] Spiritual Care Note  Referred by GI Navigator [name]/RN. Familiar with Morgan Dawson's husband Morgan Dawson, because we had adjacent offices at [facility].   Met Morgan Dawson and Morgan Dawson briefly in the lobby to offer support, encouragement, and ongoing Spiritual Care availability as part of their care team. Both were very appreciative and plan to reach out as they learn more about Morgan Dawson's situation. Will follow as well, but please also page if immediate needs arise. Thank you.   [place], [place], [facility] Pager [phone] Voicemail [phone]

## 2017-07-30 NOTE — Progress Notes (Signed)
  Oncology Nurse Navigator Documentation  Navigator Location: [facility] (07/30/17 1538)   )Navigator Encounter Type: Initial MedOnc (07/30/17 1538)   Abnormal Finding Date: 07/23/17 (07/30/17 1538) Confirmed Diagnosis Date: 07/29/17 (07/30/17 1538)               Patient Visit Type: Initial;MedOnc (07/30/17 1538) Treatment Phase: Pre-Tx/Tx Discussion (07/30/17 1538) Barriers/Navigation Needs: No barriers at this time;No Questions;No Needs (07/30/17 1538)   Interventions: Psycho-social support (07/30/17 1538)  I met with patient and her husband at initial med/onc appointment. I introduced myself and my role as GI Navigator and provided my contact information. I gave Morgan Dawson a flyer for the GI support group and contacted Johnnye Lana LCSW requesting that she reach out to patient. Patient verbalized understanding that she can call with questions or concerns.          Acuity: Level 2 (07/30/17 1538)   Acuity Level 2: Ongoing guidance and education throughout treatment as needed;Initial guidance, education and coordination as needed (07/30/17 1538)     Time Spent with Patient: 15 (07/30/17 1538)

## 2017-08-03 ENCOUNTER — Encounter: Payer: Self-pay | Admitting: General Practice

## 2017-08-03 ENCOUNTER — Other Ambulatory Visit: Payer: Self-pay | Admitting: Radiology

## 2017-08-03 NOTE — Progress Notes (Signed)
[facility] CSW Progress Note  CSW spoke w patient by phone, reviewed [place] and staff.  Sent information packet.  Per patient, she has significant support from friends, neighbors, church and community groups, feels well connected w people who are able to help her w practical needs.  Pt is awaiting clarification of treatment plan and realizes that she is in process of finding out information about her diagnosis and plan of care.  CSW encouraged patient to stop by [facility] to connect with staff for support and information as needed.    Edwyna Shell, LCSW Clinical Social Worker Phone:  [phone]

## 2017-08-03 NOTE — Progress Notes (Signed)
  Oncology Nurse Navigator Documentation  Navigator Location: [facility] (08/03/17 1354)   )Navigator Encounter Type: Telephone (08/03/17 1354) Telephone: [facility] Call;Appt Confirmation/Clarification (08/03/17 1354)                     Treatment Phase: Pre-Tx/Tx Discussion (08/03/17 1354)     Interventions: Coordination of Care;Psycho-social support (08/03/17 1354)  I spoke with patient multiple times this morning to coordinate coordinate appointments and liver BX. I called and moved patient's Liver BX up to 08/05/17 @ 6 AM at [facility]. Patient aware to be NPO after MN. Patient rescheduled her appointment with Dr. Paulita Fujita from 08/05/17 to 08/09/17 and is requesting that her BX results be faxed to Dr. Paulita Fujita prior to her appointment on 08/09/17. Scheduling message sent to get Mrs. Perreira in to see Dr. Burr Medico on 12/18 or 08/11/17.          Acuity: Level 3 (08/03/17 1354)     Acuity Level 3: Coordination of multimodality treatment;Emotional needs;Ongoing guidance and education provided throughout treatment (08/03/17 1354)   Time Spent with Patient: 45 (08/03/17 1354)

## 2017-08-04 ENCOUNTER — Telehealth: Payer: Self-pay | Admitting: Hematology

## 2017-08-04 ENCOUNTER — Ambulatory Visit: Payer: 59 | Admitting: Hematology

## 2017-08-04 NOTE — Telephone Encounter (Signed)
Scheduled appt per 12/11 sch msg - spoke with patient regarding appts that were added.

## 2017-08-05 ENCOUNTER — Ambulatory Visit (HOSPITAL_COMMUNITY)
Admission: RE | Admit: 2017-08-05 | Discharge: 2017-08-05 | Disposition: A | Discharge status: Home / Self Care | Payer: 59 | Source: Ambulatory Visit | Attending: Hematology | Admitting: Hematology

## 2017-08-05 ENCOUNTER — Encounter (HOSPITAL_COMMUNITY): Payer: Self-pay

## 2017-08-05 DIAGNOSIS — K7689 Other specified diseases of liver: Secondary | ICD-10-CM | POA: Diagnosis not present

## 2017-08-05 DIAGNOSIS — C801 Malignant (primary) neoplasm, unspecified: Secondary | ICD-10-CM | POA: Insufficient documentation

## 2017-08-05 DIAGNOSIS — H8109 Meniere's disease, unspecified ear: Secondary | ICD-10-CM | POA: Diagnosis not present

## 2017-08-05 DIAGNOSIS — C227 Other specified carcinomas of liver: Secondary | ICD-10-CM | POA: Diagnosis not present

## 2017-08-05 DIAGNOSIS — C787 Secondary malignant neoplasm of liver and intrahepatic bile duct: Secondary | ICD-10-CM | POA: Diagnosis not present

## 2017-08-05 DIAGNOSIS — K859 Acute pancreatitis without necrosis or infection, unspecified: Secondary | ICD-10-CM | POA: Insufficient documentation

## 2017-08-05 HISTORY — DX: Acute pancreatitis without necrosis or infection, unspecified: K85.90

## 2017-08-05 HISTORY — DX: Meniere's disease, unspecified ear: H81.09

## 2017-08-05 LAB — CBC
HEMATOCRIT: 41.3 % (ref 36.0–46.0)
HEMOGLOBIN: 13.5 g/dL (ref 12.0–15.0)
MCH: 30.8 pg (ref 26.0–34.0)
MCHC: 32.7 g/dL (ref 30.0–36.0)
MCV: 94.1 fL (ref 78.0–100.0)
Platelets: 365 10*3/uL (ref 150–400)
RBC: 4.39 MIL/uL (ref 3.87–5.11)
RDW: 13.1 % (ref 11.5–15.5)
WBC: 7.8 10*3/uL (ref 4.0–10.5)

## 2017-08-05 LAB — APTT: APTT: 29 s (ref 24–36)

## 2017-08-05 LAB — PROTIME-INR
INR: 1.03
PROTHROMBIN TIME: 13.4 s (ref 11.4–15.2)

## 2017-08-05 MED ORDER — GELATIN ABSORBABLE 12-7 MM EX MISC
CUTANEOUS | Status: AC
  Filled 2017-08-05: qty 1

## 2017-08-05 MED ORDER — SODIUM CHLORIDE 0.9 % IV SOLN
INTRAVENOUS | Status: AC | PRN
  Administered 2017-08-05: 10 mL/h via INTRAVENOUS

## 2017-08-05 MED ORDER — MIDAZOLAM HCL 2 MG/2ML IJ SOLN
INTRAMUSCULAR | Status: AC | PRN
  Administered 2017-08-05 (×2): 0.5 mg via INTRAVENOUS
  Administered 2017-08-05: 1 mg via INTRAVENOUS

## 2017-08-05 MED ORDER — SODIUM CHLORIDE 0.9 % IV SOLN: INTRAVENOUS | Status: DC

## 2017-08-05 MED ORDER — FENTANYL CITRATE (PF) 100 MCG/2ML IJ SOLN
INTRAMUSCULAR | Status: AC
  Filled 2017-08-05: qty 2

## 2017-08-05 MED ORDER — FENTANYL CITRATE (PF) 100 MCG/2ML IJ SOLN
INTRAMUSCULAR | Status: AC | PRN
  Administered 2017-08-05 (×2): 25 ug via INTRAVENOUS
  Administered 2017-08-05: 50 ug via INTRAVENOUS

## 2017-08-05 MED ORDER — LIDOCAINE HCL (PF) 1 % IJ SOLN
INTRAMUSCULAR | Status: AC
  Filled 2017-08-05: qty 30

## 2017-08-05 MED ORDER — MIDAZOLAM HCL 2 MG/2ML IJ SOLN
INTRAMUSCULAR | Status: AC
  Filled 2017-08-05: qty 2

## 2017-08-05 NOTE — H&P (Signed)
Chief Complaint: liver lesions  Referring Physician:Dr. [name]  Supervising Physician: Sandi Mariscal  Patient Status: [facility] - [facility]  HPI: Morgan Dawson is a 56 y.o. female who was having abdominal bloating and pain in late November.  She was found to have pancreatitis and then had an [place] to evaluate for gallstones.  Her US showed multiple liver lesions concerning for metastatic disease.  She then had a CT scan that revealed widespread metastatic disease throughout the liver, but no primary was found.  She then had a PET scan which revealed hypermetabolic activity within the liver lesions.  No definite primary was noted on this exam either.  She presents today after being evaluated by Dr. Burr Medico for a liver biopsy to hopefully determine the source of her lesions to initiate appropriate treatment.  Past Medical History:  Past Medical History:  Diagnosis Date  . Meniere disease 2016  . Pancreatitis     Past Surgical History:  Past Surgical History:  Procedure Laterality Date  . CERVICAL ABLATION    . ENDOMETRIAL ABLATION  2011  . MANDIBLE SURGERY  1999    Family History:  Family History  Problem Relation Age of Onset  . Cancer Father 42       colon cancer   . Cancer Maternal Aunt        lymphoma   . Cancer Maternal Grandfather 76       colon cancer   . Cancer Cousin        breast cancer     Social History:  reports that  has never smoked. she has never used smokeless tobacco. She reports that she does not drink alcohol or use drugs.  Allergies: No Known Allergies  Medications: Medications reviewed in epic.  Please HPI for pertinent positives, otherwise complete 10 system ROS negative, except constipation and some occasional nausea with eating.  Mallampati Score: MD Evaluation Airway: WNL Heart: WNL Abdomen: WNL Chest/ Lungs: WNL ASA  Classification: 2 Mallampati/Airway Score: One  Physical Exam: BP 132/81   Pulse 74   Temp 98.4 F (36.9 C) (Oral)   Resp  16   Ht 5\' 5"  (1.651 m)   Wt 135 lb (61.2 kg)   LMP 10/23/2015   SpO2 97%   BMI 22.47 kg/m  Body mass index is 22.47 kg/m. General: pleasant, WD, WN white female who is laying in bed in NAD HEENT: head is normocephalic, atraumatic.  Sclera are noninjected.  PERRL.  Ears and nose without any masses or lesions.  Mouth is pink and moist Heart: regular, rate, and rhythm.  Normal s1,s2. No obvious murmurs, gallops, or rubs noted.  Palpable radial and pedal pulses bilaterally Lungs: CTAB, no wheezes, rhonchi, or rales noted.  Respiratory effort nonlabored Abd: soft, tender in RUQ, ND, +BS, no masses, hernias Psych: A&Ox3 with an appropriate affect.   Labs: Results for orders placed or performed during the hospital encounter of 08/05/17 (from the past 48 hour(s))  APTT upon arrival     Status: None   Collection Time: 08/05/17  6:10 AM  Result Value Ref Range   aPTT 29 24 - 36 seconds  CBC upon arrival     Status: None   Collection Time: 08/05/17  6:10 AM  Result Value Ref Range   WBC 7.8 4.0 - 10.5 K/uL   RBC 4.39 3.87 - 5.11 MIL/uL   Hemoglobin 13.5 12.0 - 15.0 g/dL   HCT 41.3 36.0 - 46.0 %   MCV 94.1 78.0 -  100.0 fL   MCH 30.8 26.0 - 34.0 pg   MCHC 32.7 30.0 - 36.0 g/dL   RDW 13.1 11.5 - 15.5 %   Platelets 365 150 - 400 K/uL  Protime-INR upon arrival     Status: None   Collection Time: 08/05/17  6:10 AM  Result Value Ref Range   Prothrombin Time 13.4 11.4 - 15.2 seconds   INR 1.03     Imaging: No results found.  Assessment/Plan 1. Liver lesions  We will proceed with liver biopsy today.  Labs and vitals have been reviewed.  Risks and benefits discussed with the patient including, but not limited to bleeding, infection, damage to adjacent structures or low yield requiring additional tests. All of the patient's questions were answered, patient is agreeable to proceed. Consent signed and in chart.   Thank you for this interesting consult.  I greatly enjoyed meeting Morgan Dawson and look forward to participating in their care.  A copy of this report was sent to the requesting provider on this date.  Electronically Signed: Henreitta Cea 08/05/2017, 8:00 AM   I spent a total of  30 Minutes   in face to face in clinical consultation, greater than 50% of which was counseling/coordinating care for liver lesion

## 2017-08-05 NOTE — Procedures (Signed)
Pre Procedure Dx: Liver lesion Post Procedural Dx: Same  Technically successful US guided biopsy of indeterminate mass within the caudal aspect of the right lobe of the liver.  EBL: None  No immediate complications.   Ronny Bacon, MD Pager #: [phone]

## 2017-08-05 NOTE — Discharge Instructions (Addendum)
Liver Biopsy, Care After °Refer to this sheet in the next few weeks. These instructions provide you with information on caring for yourself after your procedure. Your health care provider may also give you more specific instructions. Your treatment has been planned according to current medical practices, but problems sometimes occur. Call your health care provider if you have any problems or questions after your procedure. °What can I expect after the procedure? °After your procedure, it is typical to have the following: °· A small amount of discomfort in the area where the biopsy was done and in the right shoulder or shoulder blade. °· A small amount of bruising around the area where the biopsy was done and on the skin over the liver. °· Sleepiness and fatigue for the rest of the day. ° °Follow these instructions at home: °· Rest at home for 1-2 days or as directed by your health care provider. °· Have a friend or family member stay with you for at least 24 hours. °· Because of the medicines used during the procedure, you should not do the following things in the first 24 hours: °? Drive. °? Use machinery. °? Be responsible for the care of other people. °? Sign legal documents. °? Take a bath or shower. °· There are many different ways to close and cover an incision, including stitches, skin glue, and adhesive strips. Follow your health care provider's instructions on: °? Incision care. °? Bandage (dressing) changes and removal. °? Incision closure removal. °· Do not drink alcohol in the first week. °· Do not lift more than 5 pounds or play contact sports for 2 weeks after this test. °· Take medicines only as directed by your health care provider. Do not take medicine containing aspirin or non-steroidal anti-inflammatory medicines such as ibuprofen for 1 week after this test. °· It is your responsibility to get your test results. °Contact a health care provider if: °· You have increased bleeding from an incision  that results in more than a small spot of blood. °· You have redness, swelling, or increasing pain in any incisions. °· You notice a discharge or a bad smell coming from any of your incisions. °· You have a fever or chills. °Get help right away if: °· You develop swelling, bloating, or pain in your abdomen. °· You become dizzy or faint. °· You develop a rash. °· You are nauseous or vomit. °· You have difficulty breathing, feel short of breath, or feel faint. °· You develop chest pain. °· You have problems with your speech or vision. °· You have trouble balancing or moving your arms or legs. °This information is not intended to replace advice given to you by your health care provider. Make sure you discuss any questions you have with your health care provider. °Document Released: 02/27/2005 Document Revised: 01/16/2016 Document Reviewed: 10/06/2013 °Elsevier Interactive Patient Education © 2018 [employer] °Moderate Conscious Sedation, Adult, Care After °These instructions provide you with information about caring for yourself after your procedure. Your health care provider may also give you more specific instructions. Your treatment has been planned according to current medical practices, but problems sometimes occur. Call your health care provider if you have any problems or questions after your procedure. °What can I expect after the procedure? °After your procedure, it is common: °· To feel sleepy for several hours. °· To feel clumsy and have poor balance for several hours. °· To have poor judgment for several hours. °· To vomit if you eat   too soon. ° °Follow these instructions at home: °For at least 24 hours after the procedure: ° °· Do not: °? Participate in activities where you could fall or become injured. °? Drive. °? Use heavy machinery. °? Drink alcohol. °? Take sleeping pills or medicines that cause drowsiness. °? Make important decisions or sign legal documents. °? Take care of children on your  own. °· Rest. °Eating and drinking °· Follow the diet recommended by your health care provider. °· If you vomit: °? Drink water, juice, or soup when you can drink without vomiting. °? Make sure you have little or no nausea before eating solid foods. °General instructions °· Have a responsible adult stay with you until you are awake and alert. °· Take over-the-counter and prescription medicines only as told by your health care provider. °· If you smoke, do not smoke without supervision. °· Keep all follow-up visits as told by your health care provider. This is important. °Contact a health care provider if: °· You keep feeling nauseous or you keep vomiting. °· You feel light-headed. °· You develop a rash. °· You have a fever. °Get help right away if: °· You have trouble breathing. °This information is not intended to replace advice given to you by your health care provider. Make sure you discuss any questions you have with your health care provider. °Document Released: 05/31/2013 Document Revised: 01/13/2016 Document Reviewed: 11/30/2015 °Elsevier Interactive Patient Education © 2018 [employer] ° °

## 2017-08-05 NOTE — Sedation Documentation (Signed)
Called to give report. Nurse unavailable. Will call back 

## 2017-08-09 ENCOUNTER — Telehealth: Payer: Self-pay

## 2017-08-09 NOTE — Telephone Encounter (Signed)
Patient called to say that her appointment with Dr. Paulita Fujita has been rescheduled to Thursday 08/12/17 @ 3:30.

## 2017-08-10 ENCOUNTER — Telehealth: Payer: Self-pay

## 2017-08-10 ENCOUNTER — Other Ambulatory Visit: Payer: Self-pay

## 2017-08-10 DIAGNOSIS — C801 Malignant (primary) neoplasm, unspecified: Principal | ICD-10-CM

## 2017-08-10 DIAGNOSIS — C787 Secondary malignant neoplasm of liver and intrahepatic bile duct: Secondary | ICD-10-CM

## 2017-08-10 NOTE — Telephone Encounter (Signed)
EUS scheduled, pt instructed and medications reviewed.  Patient instructions mailed to home.  Patient to call with any questions or concerns.  

## 2017-08-10 NOTE — Telephone Encounter (Signed)
EUS scheduled for 08/19/17 at 2:15 pm

## 2017-08-10 NOTE — Telephone Encounter (Signed)
-----   Message from Milus Banister, MD sent at 08/10/2017 11:27 AM EST ----- Krista Blue, We can do this. We'll get in touch, planning on EGD/EUS next Thursday Dec 27th at [facility], This is the patient that needs the add on upper EUS for cancer unknown primary, next Thursday Dec 27th, + MAC.  Thanks  dj  ----- Message ----- From: Milus Banister, MD Sent: 08/10/2017  11:19 AM To: Milus Banister, MD, [name], MD  I may be able to do it next week, have to make sure anesthesia can help.  I'll let you know later today.  dj  ----- Message ----- From: [name], MD Sent: 08/10/2017   9:45 AM To: Milus Banister, MD  Dan,  Are you or your partner able to do a EGD and EUS this week or next week? I have a pt who was found to have diffuse liver mets, no other primary on PET. Liver biopsy showed possible upper GI primary. She is scheduled to see Dr. Paulita Fujita, but he is not available to do the procedures until after new year, I just spoke with him.   Dawn, please add her to GI conference tomorrow, thanks much  [employer]

## 2017-08-10 NOTE — Progress Notes (Signed)
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 Cholelithiasis.  No secondary signs of acute cholecystitis. 2. Multiple solid liver masses measuring up to 6.5 cm in the left lobe of the liver, evaluation for metastatic disease is recommended. These results will be called to the ordering clinician or representative by the Radiologist Assistant, and communication documented in the PACS or zVision Dashboard.      07/23/2017 Imaging    CT Abdomen W Contrast 07/23/17 IMPRESSION: 1. Widespread metastatic disease throughout the liver. No clear primary malignancy identified in the abdomen. The pelvis was not imaged. Tissue sampling recommended. 2. Probable adenopathy superior to the pancreatic tail. No evidence of pancreatic mass. 3. Suspected incidental hemangioma inferiorly in the right hepatic lobe. 4. Nonspecific nodularity in the breasts. The patient has undergone recent (03/25/2017 and 04/01/2017) mammography and ultrasound.      07/29/2017 Initial Diagnosis    Metastasis to liver of unknown origin ([facility])      07/29/2017 PET scan    PET 07/29/17  IMPRESSION: 1. Numerous bulky liver masses are hypermetabolic compatible with malignancy. 2. Accentuated activity within or along the pancreatic tail, likely represent a primary pancreatic tumor. Consider pancreatic protocol MRI to further work this up. 3. The peripancreatic lymph node shown above the  pancreatic tail is mildly hypermetabolic favoring malignancy. 4.  Prominent stool throughout the colon favors constipation. 5. 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      08/05/2017 Pathology Results    Liver Biopsy  Diagnosis 08/05/17 Liver, needle/core biopsy - CARCINOMA. - SEE COMMENT. Microscopic Comment The malignant cells are positive for cytokeratin 7. They are negative for arginase, CDX2, cytokeratin 20, estrogen receptor, GATA-3, GCDFP, Glypican 3, Hep Par 1, Napsin A, and TTF-1. This immunohistochemical is nonspecific. Possibly primary sources include pancreatobiliary and upper gastrointestinal. Radiologic correlation is necessary. Of note, organ specific markers (GATA-3, GCDFP-breast, TTF-1, Napsin A-lung, and CDX2-colon) are negative. (JBK:ecj 08/09/2017)       Chemotherapy    PENDING FOLFIRINOX every 2 weeks starting 08/21/17          HISTORY OF PRESENTING ILLNESS: 07/30/17  Morgan Dawson 56 y.o. 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 The patient was referred by her PCP Dr. Maurice Dawson. The patient presents to the clinic today accompanied by husband.  Today the patient reports the weekend before Thanksgiving she had abdominal issues with bloating and cramps. These symptoms worsened after she would eat. This persisted past Thanksgiving so she went to her PCP.  She presented to her PCP on 07/20/17 for upper abdominal pain, nausea and bloating. She had lab work up and RUQ [place] on 07/23/17. Results showed elevated LFTs (alk phos 227, AST 191, ALT 275) and US showed multiple masses in her liver and pancreatitis. She was put on a bland die due to her pancreatitis. CT AP in 07/23/17 showed diffuse metastatic disease in her liver with no clear  primary. She had a PET scan that shows possible metastasis to peripancreatic lymph node along with her known liver masses.   Today she notes she has focused soreness in RUQ. She feels  nauseous and has vomited 3 times total in the past 3 weeks. She has lost weight, initially purposefully. She has lost 5-10 pounds. She has been constipated  lately. She takes dulcolax to help. She denies dark stool or GI bleeding. No hematemesis. She does feel more fatigued. She was relatively active 4-5 days a week before last month. She has not needed medication as she feels she has more discomfort (1-2/10). She has not been exercising because she is afraid of making things worse. Her appetite is low from her discomfort and nausea. She notes multiple tender knots on her lower legs that appeared 5 nights ago. There is no itching. She notes she will follow up with Dr. Paulita Dawson this month for her pancreatitis.   Her last mammogram at the [facility] was 04/01/17 and mostly benign. She was recommended to repeat in 6 months. She notes she frequently has cyst in her breast which were benign before.  Socially she is not working. She usually is active in the gym 3-4 days a week. She has 2 children (son and daughter) at 53yo and 78yo.   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 Her last episode of vertigo was a few months ago. She takes a diuretic and a low salt diet. She has jaw surgery for TMJ issues. She had endometrial ablation due to menorrhagia in 2011. She has only spotted since, no full period. Patient had a colonoscopy in 2014 with one benign polyp removed. Her father had colon cancer in his 49s. Her maternal grandfather had colon cancer in his 15s. Paternal aunt had lymphoma.     CURRENT THERAPY: PENDING FOLFIRINOX every 2 weeks starting 08/21/17    INTERVAL HISTORY Morgan Dawson is here for a follow up. She presents to the clinic today accompanied by her husband. She notes the biopsy she had went well overall. She eating has been harder. She is getting more nauseous with vomiting more often, not everyday. Zofran helped some, she needs a refill. She notes she feels anxious about eating  because she will vomit afterward. She has not eaten much so she has loss weight since last visit.  She still has abdominal discomfort. She does take 3 tylenol a day.      MEDICAL HISTORY:  Past Medical History:  Diagnosis Date  . Meniere disease 2016  . Pancreatitis     SURGICAL HISTORY: Past Surgical History:  Procedure Laterality Date  . CERVICAL ABLATION    . ENDOMETRIAL ABLATION  2011  . MANDIBLE SURGERY  1999    SOCIAL HISTORY: Social History   Socioeconomic History  . Marital status: Married    Spouse name: Not on file  . Number of children: Not on file  . Years of education: Not on file  . Highest education level: Not on file  Social Needs  . Financial resource strain: Not on file  . Food insecurity - worry: Not on file  . Food insecurity - inability: Not on file  . Transportation needs - medical: Not on file  . Transportation needs - non-medical: Not on file  Occupational History  . Not on file  Tobacco Use  . Smoking status: Never Smoker  . Smokeless tobacco: Never Used  Substance and Sexual Activity  . Alcohol use: No  Frequency: Never  . Drug use: No  . Sexual activity: Not on file  Other Topics Concern  . Not on file  Social History Narrative  . Not on file    FAMILY HISTORY: Family History  Problem Relation Age of Onset  . Cancer Father 48       colon cancer   . Cancer Maternal Aunt        lymphoma   . Cancer Maternal Grandfather 30       colon cancer   . Cancer Cousin        breast cancer     ALLERGIES:  has No Known Allergies.  MEDICATIONS:  Current Outpatient Medications  Medication Sig Dispense Refill  . Acetaminophen-Codeine 300-30 MG tablet Take 1 tablet by mouth every 4 (four) hours as needed for pain.     . bisacodyl (DULCOLAX) 5 MG EC tablet Take 5 mg by mouth daily as needed for mild constipation or moderate constipation.    . lidocaine-prilocaine (EMLA) cream Apply to affected area once 30 g 3  . meclizine (ANTIVERT)  25 MG tablet Take 25 mg by mouth 3 (three) times daily as needed for dizziness.    Marland Kitchen omeprazole (PRILOSEC) 40 MG capsule Take 40 mg by mouth daily as needed.    . ondansetron (ZOFRAN-ODT) 8 MG disintegrating tablet Take 1 tablet (8 mg total) by mouth every 8 (eight) hours as needed for nausea or vomiting. 40 tablet 2  . prochlorperazine (COMPAZINE) 10 MG tablet Take 1 tablet (10 mg total) by mouth every 6 (six) hours as needed for nausea or vomiting. 30 tablet 0  . triamterene-hydrochlorothiazide (DYAZIDE) 37.5-25 MG capsule Take 1 capsule by mouth daily.     No current facility-administered medications for this visit.     REVIEW OF SYSTEMS:   Constitutional: Denies fevers, chills or abnormal night sweats (+) fatigue  (+) low appetite (+) weight loss Eyes: Denies blurriness of vision, double vision or watery eyes Ears, nose, mouth, throat, and face: Denies mucositis or sore throat Respiratory: Denies cough, dyspnea or wheezes Cardiovascular: Denies palpitation, chest discomfort or lower extremity swelling Gastrointestinal:  (+) Pain/discomfort in RUQ (1-2/10) (+) nausea and emesis, more frequent (+) constipation Skin: Denies abnormal skin rashes (+) tender knots down lower legs bilaterally Lymphatics: Denies new lymphadenopathy or easy bruising Neurological:Denies numbness, tingling or new weaknesses Behavioral/Psych: Mood is stable, no new changes  All other systems were reviewed with the patient and are negative.  PHYSICAL EXAMINATION: ECOG PERFORMANCE STATUS: 1 - Symptomatic but completely ambulatory  Vitals:   08/11/17 1357  BP: 126/85  Pulse: 99  Resp: 18  Temp: 97.8 F (36.6 C)  SpO2: 97%   Filed Weights   08/11/17 1357  Weight: 134 lb 1.6 oz (60.8 kg)    GENERAL:alert, no distress and comfortable SKIN: skin color, texture, turgor are normal, no rashes or significant lesions EYES: normal, conjunctiva are pink and non-injected, sclera clear OROPHARYNX:no exudate, no  erythema and lips, buccal mucosa, and tongue normal  NECK: supple, thyroid normal size, non-tender, without nodularity LYMPH:  no palpable lymphadenopathy in the cervical, axillary or inguinal LUNGS: clear to auscultation and percussion with normal breathing effort HEART: regular rate & rhythm and no murmurs and no lower extremity edema ABDOMEN:abdomen soft, normal bowel sounds (+) hepatomegaly about 3cm below ribcage with tenderness on palpation  Musculoskeletal:no cyanosis of digits and no clubbing  PSYCH: alert & oriented x 3 with fluent speech NEURO: no focal motor/sensory deficits  LABORATORY DATA:  I have reviewed the data as listed CBC Latest Ref Rng & Units 08/05/2017 12/25/2009  WBC 4.0 - 10.5 K/uL 7.8 5.9  Hemoglobin 12.0 - 15.0 g/dL 13.5 12.8  Hematocrit 36.0 - 46.0 % 41.3 37.7  Platelets 150 - 400 K/uL 365 292    CMP Latest Ref Rng & Units 12/25/2009  Glucose 70 - 99 mg/dL 98  BUN 6 - 23 mg/dL 16  Creatinine 0.4 - 1.2 mg/dL 0.79  Sodium 135 - 145 mEq/L 139  Potassium 3.5 - 5.1 mEq/L 3.7  Chloride 96 - 112 mEq/L 106  CO2 19 - 32 mEq/L 27  Calcium 8.4 - 10.5 mg/dL 9.5   Her recent outside lab (07/20/2017) WBC 7.7, hemoglobin 14.4, hematocrit 43%, platelets 374K, MCV 91.8 BUN 17, creatinine 0.88, GFR 67, sodium 136, potassium 3.3, calcium 11.0, total protein 7.8, albumin 4.4, total bilirubin 0.7, ALP 227, AST 191, ALT 275,  lipase 2969   PATHOLOGY  Liver Biopsy  Diagnosis 08/05/17 Liver, needle/core biopsy - CARCINOMA. - SEE COMMENT. Microscopic Comment The malignant cells are positive for cytokeratin 7. They are negative for arginase, CDX2, cytokeratin 20, estrogen receptor, GATA-3, GCDFP, Glypican 3, Hep Par 1, Napsin A, and TTF-1. This immunohistochemical is nonspecific. Possibly primary sources include pancreatobiliary and upper gastrointestinal. Radiologic correlation is necessary. Of note, organ specific markers (GATA-3, GCDFP-breast, TTF-1, Napsin A-lung, and  CDX2-colon) are negative. (JBK:ecj 08/09/2017)   Diagnosis 03/07/13 Surgical, cecum, polyp -TUBULAR ADENOMA -NEGATIVE FOR HIGH GRADE DYSPLASIA   PROCEDURES  Colonoscopy by Dr. Paulita Dawson 03/07/13 IMPRESSION:  -One 5 m polyp in the cecum. Resected and retrieved -The distal rectum and anal verge are normal on retroflexion view.  -The examination was otherwise normal.    RADIOGRAPHIC STUDIES: I have personally reviewed the radiological images as listed and agreed with the findings in the report. Nm Pet Image Initial (pi) Skull Base To Thigh  Addendum Date: 08/09/2017   ADDENDUM REPORT: 08/09/2017 12:49 ADDENDUM: The original report was by Dr. Van Clines. The following addendum is by Dr. Van Clines: Reviewing this case further, I do feel that there is a distinct possibility that the pancreatic lesion questioned in my original report may be real rather than just physiologic duodenal activity. This lesion is very inconspicuous and obscured by immediately adjacent physiologic activity in the duodenum but based on the some of the positioning of the proximal jejunum I considered more likely than not that this pancreatic tail lesion is real, and may be the primary for the liver lesions. MRI may be confirmatory. We plan to discuss this case further at the GI tumor board conference on 08/11/2017. Electronically Signed   By: Van Clines M.D.   On: 08/09/2017 12:49   Result Date: 08/09/2017 CLINICAL DATA:  Initial treatment strategy for metastatic masses in the liver, primary uncertain. EXAM: NUCLEAR MEDICINE PET SKULL BASE TO THIGH TECHNIQUE: 6.5 mCi F-18 FDG was injected intravenously. Full-ring PET imaging was performed from the skull base to thigh after the radiotracer. CT data was obtained and used for attenuation correction and anatomic localization. FASTING BLOOD GLUCOSE:  Value: 117 mg/dl COMPARISON:  CT scan 09/22/2016 FINDINGS: NECK No hypermetabolic lymph nodes in the neck.  Several Dawson hypodense thyroid nodules do not require further workup. CHEST No hypermetabolic mediastinal or hilar nodes. No suspicious pulmonary nodules on the CT data. Mild biapical pleuroparenchymal scarring. ABDOMEN/PELVIS Replacement of much of the liver by hypermetabolic masses with hypermetabolic activity ranging up to 9.8. An index 6.0 by 0.3 cm lateral segment  left hepatic lobe mass on image 101/4 has a maximum SUV of 7.4. Peripancreatic node just above the pancreatic tail measures 1.3 cm in short axis and has a maximum standard uptake value of 4.4. There is some activity projecting over the junction of the pancreatic body and tail with maximum SUV 6.3, but on the PET images this seems connected to the third portion of the duodenum and [place] may represent bowel activity is registered along the pancreas rather than a true pancreatic lesion. The spleen and adrenal glands appear normal. No additional hypermetabolic adenopathy is identified. More caudad, the bowel appears normal. Prominent stool throughout the colon favors constipation. 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 No significant abnormal osseous activity. IMPRESSION: 1. Numerous bulky liver masses are hypermetabolic compatible with malignancy. 2. Accentuated activity within or along the pancreatic tail. I suspect that this is probably a physiologic activity in the distal duodenum which is being is registered onto the pancreatic tail. I do not see an obvious pancreatic lesion on the CT scan. Consider pancreatic protocol MRI to further work this up. 3. The peripancreatic lymph node shown above the pancreatic tail is mildly hypermetabolic favoring malignancy. 4.  Prominent stool throughout the colon favors constipation. 5. 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 Electronically Signed: By: Van Clines M.D. On: 07/29/2017 11:39   US Biopsy (liver)  Result Date: 08/05/2017 INDICATION: No known primary, now with multiple hypermetabolic liver  lesions / masses. Please perform ultrasound-guided liver lesion biopsy for tissue diagnostic purposes. EXAM: ULTRASOUND GUIDED LIVER LESION BIOPSY COMPARISON:  PET-CT - 07/29/2017; CT abdomen pelvis - 07/23/2017; right upper quadrant abdominal pain - 07/23/2017 MEDICATIONS: None ANESTHESIA/SEDATION: Fentanyl 100 mcg IV; Versed 2 mg IV Total Moderate Sedation time: 15 minutes. The patient's level of consciousness and vital signs were monitored continuously by radiology nursing throughout the procedure under my direct supervision. COMPLICATIONS: None immediate. PROCEDURE: Informed written consent was obtained from the patient after a discussion of the risks, benefits and alternatives to treatment. The patient understands and consents the procedure. A timeout was performed prior to the initiation of the procedure. Ultrasound scanning was performed of the right upper abdominal quadrant demonstrates multiple mixed echogenic lesions in mass scattered throughout the liver compatible the findings on recent cross-sectional imaging. A dominant at least 5.8 x 4.6 cm mass within the subcapsular aspect of the caudal right lobe of the liver correlating with the lesion seen on preceding abdominal CT image 58, series 9) was targeted for biopsy given lesion location and sonographic window. The procedure was planned. The right upper abdominal quadrant was prepped and draped in the usual sterile fashion. The overlying soft tissues were anesthetized with 1% lidocaine with epinephrine. A 17 gauge, 6.8 cm co-axial needle was advanced into a peripheral aspect of the lesion. This was followed by 5 core biopsies with an 18 gauge core device under direct ultrasound guidance. The coaxial needle tract was embolized with a Dawson amount of Gel-Foam slurry and superficial hemostasis was obtained with manual compression. Post procedural scanning was negative for definitive area of hemorrhage or additional complication. A dressing was placed. The  patient tolerated the procedure well without immediate post procedural complication. IMPRESSION: Technically successful ultrasound guided core needle biopsy of dominant mass within the caudal aspect of the right lobe of the liver. Electronically Signed   By: Sandi Mariscal M.D.   On: 08/05/2017 09:36   Ct Abdomen W Wo Contrast  Result Date: 07/23/2017 CLINICAL DATA:  Epigastric pain with abdominal bloating  and nausea for 2 weeks. No history of malignancy. Multiple liver lesions on ultrasound. EXAM: CT ABDOMEN WITHOUT AND WITH CONTRAST TECHNIQUE: Multidetector CT imaging of the abdomen was performed following the standard protocol before and following the bolus administration of intravenous contrast. CONTRAST:  187m ISOVUE-300 IOPAMIDOL (ISOVUE-300) INJECTION 61% COMPARISON:  Ultrasound same date. FINDINGS: Lower chest: Clear lung bases. No significant pleural or pericardial effusion. Hepatobiliary: The liver is enlarged by multiple heterogeneously enhancing masses. There is one lesion inferiorly in the right hepatic lobe (segment 5) which is probably a hemangioma, measuring 4.2 x 3.2 cm on image 75 of series 9. However, there are numerous other enhancing lesions which are most consistent with metastatic disease. These include a 5.3 x 7.8 cm lesion in segment 4A (image 32) and a 4.9 x 5.8 cm lesion in segment 6 (image 66). No definite morphologic changes of cirrhosis. Probable Dawson gallstones or polyps. No gallbladder wall thickening or biliary dilatation. Pancreas: No intrinsic pancreatic lesions are identified. There is no pancreatic ductal dilatation or surrounding inflammation. Spleen: Normal in size without focal abnormality. Adrenals/Urinary Tract: Both adrenal glands appear normal. The kidneys appear normal without evidence of urinary tract calculus or hydronephrosis. Bladder not imaged. Stomach/Bowel: No evidence of bowel wall thickening, distention or surrounding inflammatory change.The pelvis was not  imaged. Vascular/Lymphatic: 1.5 x 1.3 cm soft tissue nodule superior to the pancreatic tail on image 40 of series 9 is probably a lymph node. There are Dawson lymph nodes the porta hepatis. No enlarged retroperitoneal lymph nodes are seen. No significant vascular findings. The portal, superior mesenteric and splenic veins are patent. Other: No ascites or peritoneal nodularity. Musculoskeletal: No acute or significant osseous findings. There is nodularity within the visualized breasts without definite enhancement. IMPRESSION: 1. Widespread metastatic disease throughout the liver. No clear primary malignancy identified in the abdomen. The pelvis was not imaged. Tissue sampling recommended. 2. Probable adenopathy superior to the pancreatic tail. No evidence of pancreatic mass. 3. Suspected incidental hemangioma inferiorly in the right hepatic lobe. 4. Nonspecific nodularity in the breasts. The patient has undergone recent (03/25/2017 and 04/01/2017) mammography and ultrasound. Electronically Signed   By: WRichardean SaleM.D.   On: 07/23/2017 17:47   [place]domen Limited Ruq  Result Date: 07/23/2017 CLINICAL DATA:  56y/o F; postprandial right upper quadrant abdominal pain for 2 weeks. Transaminitis. EXAM: ULTRASOUND ABDOMEN LIMITED RIGHT UPPER QUADRANT COMPARISON:  None. FINDINGS: Gallbladder: No gallbladder wall thickening or pericholecystic fluid. Negative sonographic Murphy's sign. Multiple gallstones measuring up to 7 mm including a stone at the gallbladder neck. Common bile duct: Diameter: 2.4 mm Liver: Multiple solid masses in the liver measuring up to 6.5 cm in the left lobe and in the right lobe measuring up to 5.1 and 3.7 cm. Portal vein is patent on color Doppler imaging with normal direction of blood flow towards the liver. IMPRESSION: 1. Cholelithiasis.  No secondary signs of acute cholecystitis. 2. Multiple solid liver masses measuring up to 6.5 cm in the left lobe of the liver, evaluation for metastatic  disease is recommended. These results will be called to the ordering clinician or representative by the Radiologist Assistant, and communication documented in the PACS or zVision Dashboard. Electronically Signed   By: LKristine GarbeM.D.   On: 07/23/2017 14:35     ASSESSMENT & PLAN:  Morgan DACEYis a 56y.o. caucasian female with a history of vertigo and menieres disease, presented with epigastric discomfort, low appetite and 5 pound weight loss  1. 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  07/29/17 PET scan shows diffuse metastatic disease in her enlarged liver.  This is most consistent with diffuse liver metastasis.  -Her liver biopsy confirmed metastatic adenocarcinoma, most consistent with biliary or pancreatic primary this was discussed with patient.  -After further reviewing her 07/29/17 PET in the GI Tumor Board, the initial uptake in the duodenum is felt to be in the pancrease tail, which represents the primary tumor.  The consensus from GI tumor board is this is most consistent with metastatic pancreatic adenocarcinoma to liver.  We felt she does not need further imaging or EUS to confirm the primary site.  -Given her metastatic stage IV disease, her metastatic cancer is unfortunately not curable, and the goal of therapy is palliative, to prolong her life and improve her quality of life.  -Goal of therapy is to control the disease.  -I will set up genetic referral to see if she has BRCA mutation. If positive then she would be a  candidate for PARP inhibitor  -I will request her liver biopsy to be tested for FO to see if she has MSI disease, or targetable mutations. -I discussed the systemic chemotherapy options, which includes but not limited to single agent gemcitabine, gemcitabine and Abraxane, and FOLFIRINOX.  Given her young age and overall     -Standard of care is more aggressive chemotherapy with FOLFIRINOX every 2 week or moderately aggressive gemcitabine and Abraxane. Plan to continue for as long as she can tolerate or until cancer progresses. I discussed the side effects in great detail.   --Chemotherapy consent: Side effects including but does not limited to, fatigue, nausea, vomiting, diarrhea, hair loss, neuropathy, fluid retention, renal and kidney dysfunction, neutropenic fever, needed for blood transfusion, bleeding, were discussed with patient in great detail. She agrees to proceed. -She understands goal of therapy is palliative -She will receive neulasta injection, I recommend OTC Claritin to help with the possible bone pain from neulasta  -She will need a port placed and chemo class before starting treatment  -I strongly encouraged her to eat and drink more to help tolerate treatment. If needed we can adjust chemo dose.  -Will monitor with tumor markers and CT scans every 2-3 months  -Plan to start FOLFIRINOX on 08/21/17 -I encouraged her to contact the clinic if she develops fever of 100.78F or higher, dehydration or significant or unexpected side effects.  -F/u on 1/14 before second cycle chemo    2. Pancreatitis -Found by 07/23/17 CT scan, likely related to her cancer.  -On bland diet -I suggest Prilosec and to continue Dawson meals with bland diet. If needed I can give pain medication. -Abdominal pain is mild, no need for pain medication at this time. She is currently taking tylenol up to TID.    3. Multiple skin rash on lower legs -tender but no itchiness  -I suggest she start with benadryl and OTC hydrocortone  -Will monitor.   4. Nausea and Vomiting  -I recommend she take 1 zofran at the beginning of the day and if she feels nauseous again she should take another. She can take up to TID.  -I will refill Zofran today and prescribe compazine today that she can take up to four times a day. She can alternate.    5. Anorexia, weight loss -She has anxiety about eating due to emesis.  -  I encouraged her to eat more Dawson meals with bland or liquid diet. She can increase her intake of ensure boost -Will request nutrition consult again   6. Goal of care discussion  -We discussed the incurable nature of her cancer, and the overall poor prognosis, especially if she does not have good response to chemotherapy or progress on chemo -The patient understands the goal of care is palliative. -I recommend DNR/DNI, she will think about it   PLAN:  -Refill zofran and prescribe Compazine today -Nutrition consult ASAP (within one week)  -Chemo class next week -Lab today  -IR port placement next week -Lab, port flush and chemo FOLFIRINOX starting in one week (? 12/29) and 2 weeks after -F/u with second chemo   -Genetic referral     No orders of the defined types were placed in this encounter.   All questions were answered. The patient knows to call the clinic with any problems, questions or concerns. I spent 30 minutes counseling the patient face to face. The total time spent in the appointment was 40 minutes and more than 50% was on counseling.     Morgan Merle, MD 08/11/2017 11:08 PM   This document serves as a record of services personally performed by Morgan Merle, MD. It was created on her behalf by Joslyn Devon, a trained medical scribe. The creation of this record is based on the scribe's personal observations and the provider's statements to them.    I have reviewed the above documentation for accuracy and completeness, and I agree with the above.

## 2017-08-11 ENCOUNTER — Other Ambulatory Visit: Payer: Self-pay | Admitting: Radiology

## 2017-08-11 ENCOUNTER — Telehealth: Payer: Self-pay | Admitting: Hematology

## 2017-08-11 ENCOUNTER — Ambulatory Visit: Payer: 59

## 2017-08-11 ENCOUNTER — Other Ambulatory Visit: Payer: Self-pay | Admitting: Hematology

## 2017-08-11 ENCOUNTER — Ambulatory Visit (HOSPITAL_BASED_OUTPATIENT_CLINIC_OR_DEPARTMENT_OTHER): Payer: 59 | Admitting: Hematology

## 2017-08-11 VITALS — BP 126/85 | HR 99 | Temp 97.8°F | Resp 18 | Ht 65.0 in | Wt 134.1 lb

## 2017-08-11 DIAGNOSIS — R21 Rash and other nonspecific skin eruption: Secondary | ICD-10-CM

## 2017-08-11 DIAGNOSIS — C787 Secondary malignant neoplasm of liver and intrahepatic bile duct: Secondary | ICD-10-CM | POA: Diagnosis not present

## 2017-08-11 DIAGNOSIS — R630 Anorexia: Secondary | ICD-10-CM

## 2017-08-11 DIAGNOSIS — Z7189 Other specified counseling: Secondary | ICD-10-CM | POA: Insufficient documentation

## 2017-08-11 DIAGNOSIS — C259 Malignant neoplasm of pancreas, unspecified: Secondary | ICD-10-CM | POA: Diagnosis not present

## 2017-08-11 DIAGNOSIS — C251 Malignant neoplasm of body of pancreas: Secondary | ICD-10-CM

## 2017-08-11 DIAGNOSIS — R634 Abnormal weight loss: Secondary | ICD-10-CM

## 2017-08-11 DIAGNOSIS — K859 Acute pancreatitis without necrosis or infection, unspecified: Secondary | ICD-10-CM

## 2017-08-11 DIAGNOSIS — R112 Nausea with vomiting, unspecified: Secondary | ICD-10-CM | POA: Diagnosis not present

## 2017-08-11 DIAGNOSIS — Z95828 Presence of other vascular implants and grafts: Secondary | ICD-10-CM

## 2017-08-11 MED ORDER — LIDOCAINE-PRILOCAINE 2.5-2.5 % EX CREA: TOPICAL_CREAM | CUTANEOUS | 3 refills | Status: DC

## 2017-08-11 MED ORDER — ONDANSETRON 8 MG PO TBDP: 8.0000 mg | ORAL_TABLET | Freq: Three times a day (TID) | ORAL | 2 refills | Status: DC | PRN

## 2017-08-11 MED ORDER — PROCHLORPERAZINE MALEATE 10 MG PO TABS: 10.0000 mg | ORAL_TABLET | Freq: Four times a day (QID) | ORAL | 0 refills | Status: DC | PRN

## 2017-08-11 NOTE — Progress Notes (Signed)
START ON PATHWAY REGIMEN - Pancreatic     A cycle is every 14 days:     Oxaliplatin      Leucovorin      Irinotecan      5-Fluorouracil      5-Fluorouracil   **Always confirm dose/schedule in your pharmacy ordering system**    Patient Characteristics: Adenocarcinoma, Metastatic Disease, First Line, PS = 0, 1 Histology: Adenocarcinoma Current evidence of distant metastases<= Yes AJCC T Category: [place] AJCC N Category: N0 AJCC M Category: M1 AJCC 8 Stage Grouping: IV Line of Therapy: First Line Would you be surprised if this patient died  in the next year<= I would NOT be surprised if this patient died in the next year Intent of Therapy: Non-Curative / Palliative Intent, Discussed with Patient

## 2017-08-11 NOTE — Telephone Encounter (Signed)
Scheduled appt per 12/19 los - Gave patient AVS and calender per los.  

## 2017-08-12 ENCOUNTER — Ambulatory Visit (HOSPITAL_COMMUNITY)
Admission: RE | Admit: 2017-08-12 | Discharge: 2017-08-12 | Disposition: A | Discharge status: Home / Self Care | Payer: 59 | Source: Ambulatory Visit | Attending: Hematology | Admitting: Hematology

## 2017-08-12 ENCOUNTER — Encounter (HOSPITAL_COMMUNITY): Payer: Self-pay

## 2017-08-12 ENCOUNTER — Other Ambulatory Visit: Payer: Self-pay | Admitting: Hematology

## 2017-08-12 ENCOUNTER — Ambulatory Visit (HOSPITAL_COMMUNITY): Payer: 59

## 2017-08-12 ENCOUNTER — Encounter: Payer: Self-pay | Admitting: Hematology

## 2017-08-12 DIAGNOSIS — Z95828 Presence of other vascular implants and grafts: Secondary | ICD-10-CM

## 2017-08-12 DIAGNOSIS — Z5111 Encounter for antineoplastic chemotherapy: Secondary | ICD-10-CM | POA: Diagnosis not present

## 2017-08-12 DIAGNOSIS — C259 Malignant neoplasm of pancreas, unspecified: Secondary | ICD-10-CM | POA: Diagnosis present

## 2017-08-12 DIAGNOSIS — C787 Secondary malignant neoplasm of liver and intrahepatic bile duct: Secondary | ICD-10-CM | POA: Insufficient documentation

## 2017-08-12 DIAGNOSIS — Z452 Encounter for adjustment and management of vascular access device: Secondary | ICD-10-CM | POA: Diagnosis not present

## 2017-08-12 HISTORY — PX: IR FLUORO GUIDE PORT INSERTION RIGHT: IMG5741

## 2017-08-12 HISTORY — PX: IR US GUIDE VASC ACCESS RIGHT: IMG2390

## 2017-08-12 LAB — CBC
HCT: 40.1 % (ref 36.0–46.0)
HEMOGLOBIN: 12.9 g/dL (ref 12.0–15.0)
MCH: 30.4 pg (ref 26.0–34.0)
MCHC: 32.2 g/dL (ref 30.0–36.0)
MCV: 94.6 fL (ref 78.0–100.0)
Platelets: 383 10*3/uL (ref 150–400)
RBC: 4.24 MIL/uL (ref 3.87–5.11)
RDW: 13.7 % (ref 11.5–15.5)
WBC: 9.8 10*3/uL (ref 4.0–10.5)

## 2017-08-12 LAB — PROTIME-INR
INR: 1.12
Prothrombin Time: 14.3 seconds (ref 11.4–15.2)

## 2017-08-12 LAB — APTT: aPTT: 30 seconds (ref 24–36)

## 2017-08-12 MED ORDER — HEPARIN SOD (PORK) LOCK FLUSH 100 UNIT/ML IV SOLN
INTRAVENOUS | Status: AC
  Filled 2017-08-12: qty 5

## 2017-08-12 MED ORDER — MIDAZOLAM HCL 2 MG/2ML IJ SOLN
INTRAMUSCULAR | Status: AC
  Filled 2017-08-12: qty 2

## 2017-08-12 MED ORDER — MIDAZOLAM HCL 2 MG/2ML IJ SOLN
INTRAMUSCULAR | Status: AC | PRN
  Administered 2017-08-12: 1 mg via INTRAVENOUS
  Administered 2017-08-12: .25 mg via INTRAVENOUS
  Administered 2017-08-12: 1 mg via INTRAVENOUS

## 2017-08-12 MED ORDER — FENTANYL CITRATE (PF) 100 MCG/2ML IJ SOLN
INTRAMUSCULAR | Status: AC
  Filled 2017-08-12: qty 2

## 2017-08-12 MED ORDER — SODIUM CHLORIDE 0.9 % IV SOLN
INTRAVENOUS | Status: DC
  Administered 2017-08-12: 50 mL/h via INTRAVENOUS

## 2017-08-12 MED ORDER — LIDOCAINE HCL 1 % IJ SOLN
INTRAMUSCULAR | Status: AC
  Filled 2017-08-12: qty 20

## 2017-08-12 MED ORDER — CEFAZOLIN SODIUM-DEXTROSE 2-4 GM/100ML-% IV SOLN
INTRAVENOUS | Status: AC
  Filled 2017-08-12: qty 100

## 2017-08-12 MED ORDER — FENTANYL CITRATE (PF) 100 MCG/2ML IJ SOLN
INTRAMUSCULAR | Status: AC | PRN
  Administered 2017-08-12: 25 ug via INTRAVENOUS
  Administered 2017-08-12: 50 ug via INTRAVENOUS
  Administered 2017-08-12: 25 ug via INTRAVENOUS

## 2017-08-12 MED ORDER — CEFAZOLIN SODIUM-DEXTROSE 2-4 GM/100ML-% IV SOLN
2.0000 g | Freq: Once | INTRAVENOUS | Status: AC
  Administered 2017-08-12: 2 g via INTRAVENOUS

## 2017-08-12 MED ORDER — LIDOCAINE HCL (PF) 1 % IJ SOLN
INTRAMUSCULAR | Status: AC | PRN
  Administered 2017-08-12: 20 mL

## 2017-08-12 NOTE — Sedation Documentation (Signed)
Patient denies pain and is resting comfortably.  

## 2017-08-12 NOTE — Sedation Documentation (Signed)
Patient is resting comfortably. 

## 2017-08-12 NOTE — Discharge Instructions (Signed)
Implanted Port Home Guide °An implanted port is a type of central line that is placed under the skin. Central lines are used to provide IV access when treatment or nutrition needs to be given through a person’s veins. Implanted ports are used for long-term IV access. An implanted port may be placed because: °· You need IV medicine that would be irritating to the small veins in your hands or arms. °· You need long-term IV medicines, such as antibiotics. °· You need IV nutrition for a long period. °· You need frequent blood draws for lab tests. °· You need dialysis. ° °Implanted ports are usually placed in the chest area, but they can also be placed in the upper arm, the abdomen, or the leg. An implanted port has two main parts: °· Reservoir. The reservoir is round and will appear as a small, raised area under your skin. The reservoir is the part where a needle is inserted to give medicines or draw blood. °· Catheter. The catheter is a thin, flexible tube that extends from the reservoir. The catheter is placed into a large vein. Medicine that is inserted into the reservoir goes into the catheter and then into the vein. ° °How will I care for my incision site? °Do not get the incision site wet. Bathe or shower as directed by your health care provider. °How is my port accessed? °Special steps must be taken to access the port: °· Before the port is accessed, a numbing cream can be placed on the skin. This helps numb the skin over the port site. °· Your health care provider uses a sterile technique to access the port. °? Your health care provider must put on a mask and sterile gloves. °? The skin over your port is cleaned carefully with an antiseptic and allowed to dry. °? The port is gently pinched between sterile gloves, and a needle is inserted into the port. °· Only "non-coring" port needles should be used to access the port. Once the port is accessed, a blood return should be checked. This helps ensure that the port  is in the vein and is not clogged. °· If your port needs to remain accessed for a constant infusion, a clear (transparent) bandage will be placed over the needle site. The bandage and needle will need to be changed every week, or as directed by your health care provider. °· Keep the bandage covering the needle clean and dry. Do not get it wet. Follow your health care provider’s instructions on how to take a shower or bath while the port is accessed. °· If your port does not need to stay accessed, no bandage is needed over the port. ° °What is flushing? °Flushing helps keep the port from getting clogged. Follow your health care provider’s instructions on how and when to flush the port. Ports are usually flushed with saline solution or a medicine called heparin. The need for flushing will depend on how the port is used. °· If the port is used for intermittent medicines or blood draws, the port will need to be flushed: °? After medicines have been given. °? After blood has been drawn. °? As part of routine maintenance. °· If a constant infusion is running, the port may not need to be flushed. ° °How long will my port stay implanted? °The port can stay in for as long as your health care provider thinks it is needed. When it is time for the port to come out, surgery will be   done to remove it. The procedure is similar to the one performed when the port was put in. °When should I seek immediate medical care? °When you have an implanted port, you should seek immediate medical care if: °· You notice a bad smell coming from the incision site. °· You have swelling, redness, or drainage at the incision site. °· You have more swelling or pain at the port site or the surrounding area. °· You have a fever that is not controlled with medicine. ° °This information is not intended to replace advice given to you by your health care provider. Make sure you discuss any questions you have with your health care provider. °Document  Released: 08/10/2005 Document Revised: 01/16/2016 Document Reviewed: 04/17/2013 °[employer] Interactive Patient Education © 2017 [employer] °Moderate Conscious Sedation, Adult, Care After °These instructions provide you with information about caring for yourself after your procedure. Your health care provider may also give you more specific instructions. Your treatment has been planned according to current medical practices, but problems sometimes occur. Call your health care provider if you have any problems or questions after your procedure. °What can I expect after the procedure? °After your procedure, it is common: °· To feel sleepy for several hours. °· To feel clumsy and have poor balance for several hours. °· To have poor judgment for several hours. °· To vomit if you eat too soon. ° °Follow these instructions at home: °For at least 24 hours after the procedure: ° °· Do not: °? Participate in activities where you could fall or become injured. °? Drive. °? Use heavy machinery. °? Drink alcohol. °? Take sleeping pills or medicines that cause drowsiness. °? Make important decisions or sign legal documents. °? Take care of children on your own. °· Rest. °Eating and drinking °· Follow the diet recommended by your health care provider. °· If you vomit: °? Drink water, juice, or soup when you can drink without vomiting. °? Make sure you have little or no nausea before eating solid foods. °General instructions °· Have a responsible adult stay with you until you are awake and alert. °· Take over-the-counter and prescription medicines only as told by your health care provider. °· If you smoke, do not smoke without supervision. °· Keep all follow-up visits as told by your health care provider. This is important. °Contact a health care provider if: °· You keep feeling nauseous or you keep vomiting. °· You feel light-headed. °· You develop a rash. °· You have a fever. °Get help right away if: °· You have trouble  breathing. °This information is not intended to replace advice given to you by your health care provider. Make sure you discuss any questions you have with your health care provider. °Document Released: 05/31/2013 Document Revised: 01/13/2016 Document Reviewed: 11/30/2015 °[employer] Interactive Patient Education © 2018 [employer] ° °

## 2017-08-12 NOTE — H&P (Signed)
Chief Complaint: Patient was seen in consultation today for [name] a cath placement at the request of Feng,Yan  Referring Physician(s): Feng,Yan  Supervising Physician: Markus Daft  [facility] Status: [facility] - [facility]  History of Present Illness: Morgan Dawson is a 56 y.o. 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 Meniere disease 2016  . Pancreatitis     Past Surgical History:  Procedure Laterality Date  . CERVICAL ABLATION    . ENDOMETRIAL ABLATION  2011  . MANDIBLE SURGERY  1999    Allergies: Patient has no known allergies.  Medications: Prior to Admission medications   Medication Sig Start Date End Date Taking? Authorizing Provider  Acetaminophen-Codeine 300-30 MG tablet Take 1 tablet by mouth every 4 (four) hours as needed for pain.  07/27/17   [provider]  bisacodyl (DULCOLAX) 5 MG EC tablet Take 5 mg by mouth daily as needed for mild constipation or moderate constipation.    [provider]  lidocaine-prilocaine (EMLA) cream Apply to affected area once 08/11/17   [name], MD  meclizine (ANTIVERT) 25 MG tablet Take 25 mg by mouth 3 (three) times daily as needed for dizziness.    [provider]  omeprazole (PRILOSEC) 40 MG capsule Take 40 mg by mouth daily as needed.    [provider]  ondansetron (ZOFRAN-ODT) 8 MG disintegrating tablet Take 1 tablet (8 mg total) by mouth every 8 (eight) hours as needed for nausea or vomiting. 08/11/17   [name], MD  prochlorperazine (COMPAZINE) 10 MG tablet Take 1 tablet (10 mg total) by mouth every 6 (six) hours as needed for nausea or vomiting. 08/11/17   [name], MD  triamterene-hydrochlorothiazide (DYAZIDE) 37.5-25 MG capsule Take 1 capsule by mouth daily.    [provider]     Family History  Problem Relation Age of Onset  . Cancer  Father 25       colon cancer   . Cancer Maternal Aunt        lymphoma   . Cancer Maternal Grandfather 57       colon cancer   . Cancer Cousin        breast cancer     Social History   Socioeconomic History  . Marital status: Married    Spouse name: None  . Number of children: None  . Years of education: None  . Highest education level: None  Social Needs  . Financial resource strain: None  . Food insecurity - worry: None  . Food insecurity - inability: None  . Transportation needs - medical: None  . Transportation needs - non-medical: None  Occupational History  . None  Tobacco Use  . Smoking status: Never Smoker  . Smokeless tobacco: Never Used  Substance and Sexual Activity  . Alcohol use: No    Frequency: Never  . Drug use: No  . Sexual activity: None  Other Topics Concern  . None  Social History Narrative  . None    Review of Systems: A 12 point ROS discussed and pertinent positives are indicated in the HPI above.  All other systems are negative.  Review of Systems  Constitutional: Positive for appetite change, fatigue and unexpected weight change. Negative for activity change.  Respiratory: Negative for shortness of breath.   Gastrointestinal: Positive for abdominal pain and nausea.  Neurological: Positive for weakness.  Psychiatric/Behavioral: Negative for behavioral problems and confusion.    Vital Signs: BP (!) 140/93   Pulse 92   Temp 98.8 F (37.1 C)   Resp 20   Ht 5\' 5"  (1.651 m)   Wt 134 lb (60.8 kg)   LMP 10/23/2015   SpO2 98%   BMI 22.30 kg/m   Physical Exam  Constitutional: She is oriented to person, place, and time.  Cardiovascular: Normal rate, regular rhythm and normal heart sounds.  Pulmonary/Chest: Effort normal and breath sounds normal.  Abdominal: Soft. Bowel sounds are normal. There is no tenderness.  Musculoskeletal: Normal range of motion.  Neurological: She is alert and oriented to person, place, and time.  Skin: Skin is  warm and dry.  Psychiatric: She has a normal mood and affect. Her behavior is normal. Judgment and thought content normal.  Nursing note and vitals reviewed.   Imaging: Nm Pet Image Initial (pi) Skull Base To Thigh  Addendum Date: 08/09/2017   ADDENDUM REPORT: 08/09/2017 12:49 ADDENDUM: The original report was by Dr. Van Clines. The following addendum is by Dr. Van Clines: Reviewing this case further, I do feel that there is a distinct possibility that the pancreatic lesion questioned in my original report may be real rather than just physiologic duodenal activity. This lesion is very inconspicuous and obscured by immediately adjacent physiologic activity in the duodenum but based on the some of the positioning of the proximal jejunum I considered more likely than not that this pancreatic tail lesion is real, and may be the primary for the liver lesions. MRI may be confirmatory. We plan to discuss this case further at the GI tumor board conference on 08/11/2017. Electronically Signed   By: Van Clines M.D.   On: 08/09/2017 12:49   Result Date: 08/09/2017 CLINICAL DATA:  Initial treatment strategy for metastatic masses in the liver, primary uncertain. EXAM: NUCLEAR MEDICINE PET SKULL BASE TO THIGH TECHNIQUE: 6.5 mCi F-18 FDG was injected intravenously. Full-ring PET imaging was performed from the skull base to thigh after the radiotracer. CT data was obtained and used for attenuation correction and anatomic localization. FASTING BLOOD GLUCOSE:  Value: 117 mg/dl COMPARISON:  CT scan 09/22/2016 FINDINGS: NECK No hypermetabolic lymph nodes in the neck. Several small hypodense thyroid nodules do not require further workup. CHEST No hypermetabolic mediastinal or hilar nodes. No suspicious pulmonary nodules on the CT data. Mild biapical pleuroparenchymal scarring. ABDOMEN/PELVIS Replacement of much of the liver by hypermetabolic masses with hypermetabolic activity ranging up to 9.8. An  index 6.0 by 0.3 cm lateral segment left hepatic lobe mass on image 101/4 has a maximum SUV of 7.4. Peripancreatic node just above the pancreatic tail measures 1.3 cm in short axis and has a maximum standard uptake value of 4.4. There is some activity projecting over the junction of the pancreatic body and tail with maximum SUV 6.3, but on the PET images this seems connected to the third portion of the duodenum and [place] may represent bowel activity is registered along the pancreas rather than a true pancreatic lesion. The spleen and adrenal glands appear normal. No additional hypermetabolic adenopathy is identified. More caudad, the bowel appears normal. Prominent stool throughout the colon favors constipation. 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 No significant abnormal osseous activity. IMPRESSION: 1. Numerous bulky liver masses are hypermetabolic compatible with malignancy. 2. Accentuated activity within or along the pancreatic tail. I suspect that this is probably a physiologic activity in the distal duodenum which is being is registered  onto the pancreatic tail. I do not see an obvious pancreatic lesion on the CT scan. Consider pancreatic protocol MRI to further work this up. 3. The peripancreatic lymph node shown above the pancreatic tail is mildly hypermetabolic favoring malignancy. 4.  Prominent stool throughout the colon favors constipation. 5. 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 Electronically Signed: By: Van Clines M.D. On: 07/29/2017 11:39   US Biopsy (liver)  Result Date: 08/05/2017 INDICATION: No known primary, now with multiple hypermetabolic liver lesions / masses. Please perform ultrasound-guided liver lesion biopsy for tissue diagnostic purposes. EXAM: ULTRASOUND GUIDED LIVER LESION BIOPSY COMPARISON:  PET-CT - 07/29/2017; CT abdomen pelvis - 07/23/2017; right upper quadrant abdominal pain - 07/23/2017 MEDICATIONS: None ANESTHESIA/SEDATION: Fentanyl 100 mcg IV; Versed 2 mg IV  Total Moderate Sedation time: 15 minutes. The patient's level of consciousness and vital signs were monitored continuously by radiology nursing throughout the procedure under my direct supervision. COMPLICATIONS: None immediate. PROCEDURE: Informed written consent was obtained from the patient after a discussion of the risks, benefits and alternatives to treatment. The patient understands and consents the procedure. A timeout was performed prior to the initiation of the procedure. Ultrasound scanning was performed of the right upper abdominal quadrant demonstrates multiple mixed echogenic lesions in mass scattered throughout the liver compatible the findings on recent cross-sectional imaging. A dominant at least 5.8 x 4.6 cm mass within the subcapsular aspect of the caudal right lobe of the liver correlating with the lesion seen on preceding abdominal CT image 58, series 9) was targeted for biopsy given lesion location and sonographic window. The procedure was planned. The right upper abdominal quadrant was prepped and draped in the usual sterile fashion. The overlying soft tissues were anesthetized with 1% lidocaine with epinephrine. A 17 gauge, 6.8 cm co-axial needle was advanced into a peripheral aspect of the lesion. This was followed by 5 core biopsies with an 18 gauge core device under direct ultrasound guidance. The coaxial needle tract was embolized with a small amount of Gel-Foam slurry and superficial hemostasis was obtained with manual compression. Post procedural scanning was negative for definitive area of hemorrhage or additional complication. A dressing was placed. The patient tolerated the procedure well without immediate post procedural complication. IMPRESSION: Technically successful ultrasound guided core needle biopsy of dominant mass within the caudal aspect of the right lobe of the liver. Electronically Signed   By: Sandi Mariscal M.D.   On: 08/05/2017 09:36   Ct Abdomen W Wo Contrast  Result  Date: 07/23/2017 CLINICAL DATA:  Epigastric pain with abdominal bloating and nausea for 2 weeks. No history of malignancy. Multiple liver lesions on ultrasound. EXAM: CT ABDOMEN WITHOUT AND WITH CONTRAST TECHNIQUE: Multidetector CT imaging of the abdomen was performed following the standard protocol before and following the bolus administration of intravenous contrast. CONTRAST:  122mL ISOVUE-300 IOPAMIDOL (ISOVUE-300) INJECTION 61% COMPARISON:  Ultrasound same date. FINDINGS: Lower chest: Clear lung bases. No significant pleural or pericardial effusion. Hepatobiliary: The liver is enlarged by multiple heterogeneously enhancing masses. There is one lesion inferiorly in the right hepatic lobe (segment 5) which is probably a hemangioma, measuring 4.2 x 3.2 cm on image 75 of series 9. However, there are numerous other enhancing lesions which are most consistent with metastatic disease. These include a 5.3 x 7.8 cm lesion in segment 4A (image 32) and a 4.9 x 5.8 cm lesion in segment 6 (image 66). No definite morphologic changes of cirrhosis. Probable small gallstones or polyps. No gallbladder wall thickening or  biliary dilatation. Pancreas: No intrinsic pancreatic lesions are identified. There is no pancreatic ductal dilatation or surrounding inflammation. Spleen: Normal in size without focal abnormality. Adrenals/Urinary Tract: Both adrenal glands appear normal. The kidneys appear normal without evidence of urinary tract calculus or hydronephrosis. Bladder not imaged. Stomach/Bowel: No evidence of bowel wall thickening, distention or surrounding inflammatory change.The pelvis was not imaged. Vascular/Lymphatic: 1.5 x 1.3 cm soft tissue nodule superior to the pancreatic tail on image 40 of series 9 is probably a lymph node. There are small lymph nodes the porta hepatis. No enlarged retroperitoneal lymph nodes are seen. No significant vascular findings. The portal, superior mesenteric and splenic veins are patent.  Other: No ascites or peritoneal nodularity. Musculoskeletal: No acute or significant osseous findings. There is nodularity within the visualized breasts without definite enhancement. IMPRESSION: 1. Widespread metastatic disease throughout the liver. No clear primary malignancy identified in the abdomen. The pelvis was not imaged. Tissue sampling recommended. 2. Probable adenopathy superior to the pancreatic tail. No evidence of pancreatic mass. 3. Suspected incidental hemangioma inferiorly in the right hepatic lobe. 4. Nonspecific nodularity in the breasts. The patient has undergone recent (03/25/2017 and 04/01/2017) mammography and ultrasound. Electronically Signed   By: Richardean Sale M.D.   On: 07/23/2017 17:47   US Abdomen Limited Ruq  Result Date: 07/23/2017 CLINICAL DATA:  56 y/o F; postprandial right upper quadrant abdominal pain for 2 weeks. Transaminitis. EXAM: ULTRASOUND ABDOMEN LIMITED RIGHT UPPER QUADRANT COMPARISON:  None. FINDINGS: Gallbladder: No gallbladder wall thickening or pericholecystic fluid. Negative sonographic Murphy's sign. Multiple gallstones measuring up to 7 mm including a stone at the gallbladder neck. Common bile duct: Diameter: 2.4 mm Liver: Multiple solid masses in the liver measuring up to 6.5 cm in the left lobe and in the right lobe measuring up to 5.1 and 3.7 cm. Portal vein is patent on color Doppler imaging with normal direction of blood flow towards the liver. IMPRESSION: 1. Cholelithiasis.  No secondary signs of acute cholecystitis. 2. Multiple solid liver masses measuring up to 6.5 cm in the left lobe of the liver, evaluation for metastatic disease is recommended. These results will be called to the ordering clinician or representative by the Radiologist Assistant, and communication documented in the PACS or zVision Dashboard. Electronically Signed   By: Kristine Garbe M.D.   On: 07/23/2017 14:35    Labs:  CBC: Recent Labs    08/05/17 0610  WBC 7.8    HGB 13.5  HCT 41.3  PLT 365    COAGS: Recent Labs    08/05/17 0610  INR 1.03  APTT 29    BMP: No results for input(s): NA, K, CL, CO2, GLUCOSE, BUN, CALCIUM, CREATININE, GFRNONAA, GFRAA in the last 8760 hours.  Invalid input(s): CMP  LIVER FUNCTION TESTS: No results for input(s): BILITOT, AST, ALT, ALKPHOS, PROT, ALBUMIN in the last 8760 hours.  TUMOR MARKERS: No results for input(s): AFPTM, CEA, CA199, CHROMGRNA in the last 8760 hours.  Assessment and Plan:  Pancreatic cancer to liver To start chemo 08/19/17 For [facility] placement today  Thank you for this interesting consult.  I greatly enjoyed meeting Morgan Dawson and look forward to participating in their care.  A copy of this report was sent to the requesting provider on this date.  Electronically Signed: Lavonia Drafts, PA-C 08/12/2017, 1:01 PM   I spent a total of  30 Minutes   in face to face in clinical consultation, greater than 50% of which was counseling/coordinating care for  PAC

## 2017-08-12 NOTE — Procedures (Signed)
Placement of right jugular port.  Tip at SVC/RA junction.  Minimal blood loss and no immediate complication.  

## 2017-08-13 ENCOUNTER — Telehealth: Payer: Self-pay | Admitting: Medical Oncology

## 2017-08-13 DIAGNOSIS — C251 Malignant neoplasm of body of pancreas: Secondary | ICD-10-CM

## 2017-08-13 MED ORDER — TRAMADOL HCL 50 MG PO TABS: 50.0000 mg | ORAL_TABLET | Freq: Four times a day (QID) | ORAL | 0 refills | Status: DC | PRN

## 2017-08-13 NOTE — Telephone Encounter (Signed)
Per Lacie Tramadol called into pt pharmacy and instructions called to pt. to take daily stool softener due to constipation side effect

## 2017-08-13 NOTE — Telephone Encounter (Signed)
Increased pain RUQ- taking Tylenol with codeine. She does not want to take opioids and said Dr Burr Medico had another option for pain mangement

## 2017-08-18 ENCOUNTER — Encounter: Payer: Self-pay | Admitting: Pharmacist

## 2017-08-19 ENCOUNTER — Other Ambulatory Visit: Payer: Self-pay | Admitting: Medical

## 2017-08-19 ENCOUNTER — Other Ambulatory Visit: Payer: 59

## 2017-08-19 ENCOUNTER — Ambulatory Visit (HOSPITAL_COMMUNITY): Admission: RE | Admit: 2017-08-19 | Payer: 59 | Source: Ambulatory Visit | Admitting: Gastroenterology

## 2017-08-19 ENCOUNTER — Ambulatory Visit (HOSPITAL_BASED_OUTPATIENT_CLINIC_OR_DEPARTMENT_OTHER): Payer: 59

## 2017-08-19 ENCOUNTER — Encounter (HOSPITAL_COMMUNITY): Admission: RE | Payer: Self-pay | Source: Ambulatory Visit

## 2017-08-19 ENCOUNTER — Ambulatory Visit: Payer: 59 | Admitting: Nutrition

## 2017-08-19 ENCOUNTER — Other Ambulatory Visit (HOSPITAL_BASED_OUTPATIENT_CLINIC_OR_DEPARTMENT_OTHER): Payer: 59

## 2017-08-19 ENCOUNTER — Encounter: Payer: Self-pay | Admitting: *Deleted

## 2017-08-19 DIAGNOSIS — C251 Malignant neoplasm of body of pancreas: Secondary | ICD-10-CM | POA: Diagnosis not present

## 2017-08-19 DIAGNOSIS — C259 Malignant neoplasm of pancreas, unspecified: Secondary | ICD-10-CM | POA: Diagnosis not present

## 2017-08-19 DIAGNOSIS — C787 Secondary malignant neoplasm of liver and intrahepatic bile duct: Secondary | ICD-10-CM | POA: Diagnosis not present

## 2017-08-19 LAB — CBC WITH DIFFERENTIAL/PLATELET
BASO%: 0.5 % (ref 0.0–2.0)
Basophils Absolute: 0.1 10*3/uL (ref 0.0–0.1)
EOS%: 0.3 % (ref 0.0–7.0)
Eosinophils Absolute: 0 10*3/uL (ref 0.0–0.5)
HEMATOCRIT: 41.3 % (ref 34.8–46.6)
HEMOGLOBIN: 13.7 g/dL (ref 11.6–15.9)
LYMPH#: 0.9 10*3/uL (ref 0.9–3.3)
LYMPH%: 6.6 % — ABNORMAL LOW (ref 14.0–49.7)
MCH: 30.6 pg (ref 25.1–34.0)
MCHC: 33.1 g/dL (ref 31.5–36.0)
MCV: 92.3 fL (ref 79.5–101.0)
MONO#: 1.7 10*3/uL — ABNORMAL HIGH (ref 0.1–0.9)
MONO%: 11.7 % (ref 0.0–14.0)
NEUT%: 80.9 % — ABNORMAL HIGH (ref 38.4–76.8)
NEUTROS ABS: 11.4 10*3/uL — AB (ref 1.5–6.5)
Platelets: 532 10*3/uL — ABNORMAL HIGH (ref 145–400)
RBC: 4.48 10*6/uL (ref 3.70–5.45)
RDW: 13.9 % (ref 11.2–14.5)
WBC: 14.1 10*3/uL — AB (ref 3.9–10.3)

## 2017-08-19 LAB — COMPREHENSIVE METABOLIC PANEL
ALT: 176 U/L — AB (ref 0–55)
AST: 224 U/L — AB (ref 5–34)
Albumin: 3.7 g/dL (ref 3.5–5.0)
Alkaline Phosphatase: 578 U/L — ABNORMAL HIGH (ref 40–150)
Anion Gap: 14 mEq/L — ABNORMAL HIGH (ref 3–11)
BILIRUBIN TOTAL: 1.53 mg/dL — AB (ref 0.20–1.20)
BUN: 15.4 mg/dL (ref 7.0–26.0)
CO2: 29 meq/L (ref 22–29)
CREATININE: 1.4 mg/dL — AB (ref 0.6–1.1)
Calcium: 15.6 mg/dL (ref 8.4–10.4)
Chloride: 92 mEq/L — ABNORMAL LOW (ref 98–109)
EGFR: 43 mL/min/{1.73_m2} — ABNORMAL LOW (ref >60)
GLUCOSE: 118 mg/dL (ref 70–140)
Potassium: 3.1 mEq/L — ABNORMAL LOW (ref 3.5–5.1)
SODIUM: 135 meq/L — AB (ref 136–145)
TOTAL PROTEIN: 8.2 g/dL (ref 6.4–8.3)

## 2017-08-19 LAB — CEA (IN HOUSE-CHCC): CEA (CHCC-IN HOUSE): 1.31 ng/mL (ref 0.00–5.00)

## 2017-08-19 SURGERY — UPPER ESOPHAGEAL ENDOSCOPIC ULTRASOUND (EUS)
Anesthesia: Monitor Anesthesia Care

## 2017-08-19 MED ORDER — SODIUM CHLORIDE 0.9 % IJ SOLN
10.0000 mL | Freq: Once | INTRAMUSCULAR | Status: AC
  Administered 2017-08-19: 10 mL
  Filled 2017-08-19: qty 10

## 2017-08-19 MED ORDER — HEPARIN SOD (PORK) LOCK FLUSH 100 UNIT/ML IV SOLN
500.0000 [IU] | Freq: Once | INTRAVENOUS | Status: AC
  Administered 2017-08-19: 500 [IU]
  Filled 2017-08-19: qty 5

## 2017-08-19 MED ORDER — SODIUM CHLORIDE 0.9 % IV SOLN
Freq: Once | INTRAVENOUS | Status: DC
  Administered 2017-08-19: 17:00:00 via INTRAVENOUS

## 2017-08-19 MED ORDER — ZOLEDRONIC ACID 4 MG/100ML IV SOLN
4.0000 mg | Freq: Once | INTRAVENOUS | Status: DC
  Administered 2017-08-19: 4 mg via INTRAVENOUS
  Filled 2017-08-19: qty 100

## 2017-08-19 NOTE — Progress Notes (Signed)
56 year old female diagnosed with metastatic pancreas cancer.  She is a patient of Dr. Burr Medico.  Past medical history includes pancreatitis and Mnire's disease.  Medications include Dulcolax, Prilosec, Zofran, and Compazine.  Labs were reviewed.  Noted potassium 3.1, sodium 135, glucose 118, creatinine 1.4, calcium 15.6.  Height: 65 inches. Weight: 130.2 pounds. Usual body weight: 141 pounds in August. BMI:21.67  Patient is to begin FOLFIRINOX On December 29. She reports she presented with decreased appetite and some weight loss. She reports early satiety and constipation. She is very interested in nutrition information.  Nutrition diagnosis:  Food and nutrition related knowledge deficit related to new diagnosis of metastatic pancreas cancer as evidenced by no prior need for nutrition related information.  Intervention: Patient educated to consume small frequent meals and snacks with high-calorie, high-protein foods as tolerated. Educated patient on strategies for improving constipation. Reviewed fact sheets for nausea, vomiting. Recommended patient try oral nutrition supplements such as Carnation breakfast essentials. Provided recipes, fact sheets and contact information.  Questions were answered.  Teach back method used.  Monitoring, evaluation, goals: Patient will tolerate increased calories and protein to minimize weight loss.  Next visit:Monday January 14, during infusion.  **Disclaimer: This note was dictated with voice recognition software. Similar sounding words can inadvertently be transcribed and this note may contain transcription errors which may not have been corrected upon publication of note.**

## 2017-08-19 NOTE — Patient Instructions (Addendum)
Zoledronic Acid injection (Hypercalcemia, Oncology) What is this medicine? ZOLEDRONIC ACID (ZOE le dron ik AS id) lowers the amount of calcium loss from bone. It is used to treat too much calcium in your blood from cancer. It is also used to prevent complications of cancer that has spread to the bone. This medicine may be used for other purposes; ask your health care provider or pharmacist if you have questions. COMMON BRAND NAME(S): Zometa What should I tell my health care provider before I take this medicine? They need to know if you have any of these conditions: -aspirin-sensitive asthma -cancer, especially if you are receiving medicines used to treat cancer -dental disease or wear dentures -infection -kidney disease -receiving corticosteroids like dexamethasone or prednisone -an unusual or allergic reaction to zoledronic acid, other medicines, foods, dyes, or preservatives -pregnant or trying to get pregnant -breast-feeding How should I use this medicine? This medicine is for infusion into a vein. It is given by a health care professional in a hospital or clinic setting. Talk to your pediatrician regarding the use of this medicine in children. Special care may be needed. Overdosage: If you think you have taken too much of this medicine contact a poison control center or emergency room at once. NOTE: This medicine is only for you. Do not share this medicine with others. What if I miss a dose? It is important not to miss your dose. Call your doctor or health care professional if you are unable to keep an appointment. What may interact with this medicine? -certain antibiotics given by injection -NSAIDs, medicines for pain and inflammation, like ibuprofen or naproxen -some diuretics like bumetanide, furosemide -teriparatide -thalidomide This list may not describe all possible interactions. Give your health care provider a list of all the medicines, herbs, non-prescription drugs, or  dietary supplements you use. Also tell them if you smoke, drink alcohol, or use illegal drugs. Some items may interact with your medicine. What should I watch for while using this medicine? Visit your doctor or health care professional for regular checkups. It may be some time before you see the benefit from this medicine. Do not stop taking your medicine unless your doctor tells you to. Your doctor may order blood tests or other tests to see how you are doing. Women should inform their doctor if they wish to become pregnant or think they might be pregnant. There is a potential for serious side effects to an unborn child. Talk to your health care professional or pharmacist for more information. You should make sure that you get enough calcium and vitamin D while you are taking this medicine. Discuss the foods you eat and the vitamins you take with your health care professional. Some people who take this medicine have severe bone, joint, and/or muscle pain. This medicine may also increase your risk for jaw problems or a broken thigh bone. Tell your doctor right away if you have severe pain in your jaw, bones, joints, or muscles. Tell your doctor if you have any pain that does not go away or that gets worse. Tell your dentist and dental surgeon that you are taking this medicine. You should not have major dental surgery while on this medicine. See your dentist to have a dental exam and fix any dental problems before starting this medicine. Take good care of your teeth while on this medicine. Make sure you see your dentist for regular follow-up appointments. What side effects may I notice from receiving this medicine? Side effects that   you should report to your doctor or health care professional as soon as possible: -allergic reactions like skin rash, itching or hives, swelling of the face, lips, or tongue -anxiety, confusion, or depression -breathing problems -changes in vision -eye pain -feeling faint or  lightheaded, falls -jaw pain, especially after dental work -mouth sores -muscle cramps, stiffness, or weakness -redness, blistering, peeling or loosening of the skin, including inside the mouth -trouble passing urine or change in the amount of urine Side effects that usually do not require medical attention (report to your doctor or health care professional if they continue or are bothersome): -bone, joint, or muscle pain -constipation -diarrhea -fever -hair loss -irritation at site where injected -loss of appetite -nausea, vomiting -stomach upset -trouble sleeping -trouble swallowing -weak or tired This list may not describe all possible side effects. Call your doctor for medical advice about side effects. You may report side effects to FDA at 1-800-FDA-1088. Where should I keep my medicine? This drug is given in a hospital or clinic and will not be stored at home. NOTE: This sheet is a summary. It may not cover all possible information. If you have questions about this medicine, talk to your doctor, pharmacist, or health care provider.  2018 Elsevier/Gold Standard (2014-01-06 14:19:39) Hypercalcemia Hypercalcemia is having too much calcium in the blood. The body needs calcium to make bones and keep them strong. Calcium also helps the muscles, nerves, brain, and heart work the way they should. Most of the calcium in the body is in the bones. There is also some calcium in the blood. Hypercalcemia can happen when calcium comes out of the bones, or when the kidneys are not able to remove calcium from the blood. Hypercalcemia can be mild or severe. What are the causes? There are many possible causes of hypercalcemia. Common causes include:  Hyperparathyroidism. This is a condition in which the body produces too much parathyroid hormone. There are four parathyroid glands in your neck. These glands produce a chemical messenger (hormone) that helps the body absorb calcium from foods and  helps your bones release calcium.  Certain kinds of cancer, such as lung cancer, breast cancer, or myeloma.  Less common causes of hypercalcemia include:  Getting too much calcium or vitamin D from your diet.  Kidney failure.  Hyperthyroidism.  Being on bed rest for a long time.  Certain medicines.  Infections.  Sarcoidosis.  What increases the risk? This condition is more likely to develop in:  Women.  People who are 60 years or older.  People who have a family history of hypercalcemia.  What are the signs or symptoms? Mild hypercalcemia that starts slowly may not cause symptoms. Severe, sudden hypercalcemia is more likely to cause symptoms, such as:  Loss of appetite.  Increased thirst and frequent urination.  Fatigue.  Nausea and vomiting.  Headache.  Abdominal pain.  Muscle pain, twitching, or weakness.  Constipation.  Blood in the urine.  Pain in the side of the back (flank pain).  Anxiety, confusion, or depression.  Irregular heartbeat (arrhythmia).  Loss of consciousness.  How is this diagnosed? This condition may be diagnosed based on:  Your symptoms.  Blood tests.  Urine tests.  X-rays.  Ultrasound.  MRI.  CT scan.  How is this treated? Treatment for hypercalcemia depends on the cause. Treatment may include:  Receiving fluids through an IV tube.  Medicines that keep calcium levels steady after receiving fluids (loop diuretics).  Medicines that keep calcium in your bones (bisphosphonates).  Medicines that lower the calcium level in your blood.  Surgery to remove overactive parathyroid glands.  Follow these instructions at home:  Take over-the-counter and prescription medicines only as told by your health care provider.  Follow instructions from your health care provider about eating or drinking restrictions.  Drink enough fluid to keep your urine clear or pale yellow.  Stay active. Weight-bearing exercise helps to  keep calcium in your bones. Follow instructions from your health care provider about what type and level of exercise is safe for you.  Keep all follow-up visits as told by your health care provider. This is important. Contact a health care provider if:  You have a fever.  You have flank or abdominal pain that is getting worse. Get help right away if:  You have severe abdominal or flank pain.  You have chest pain.  You have trouble breathing.  You become very confused and sleepy.  You lose consciousness. This information is not intended to replace advice given to you by your health care provider. Make sure you discuss any questions you have with your health care provider. Document Released: 10/24/2004 Document Revised: 01/16/2016 Document Reviewed: 12/26/2014 Elsevier Interactive Patient Education  2018 [employer].

## 2017-08-20 ENCOUNTER — Emergency Department (HOSPITAL_COMMUNITY)
Admission: EM | Admit: 2017-08-20 | Discharge: 2017-08-21 | Disposition: A | Discharge status: Home / Self Care | Payer: 59 | Attending: Emergency Medicine | Admitting: Emergency Medicine

## 2017-08-20 ENCOUNTER — Other Ambulatory Visit (HOSPITAL_BASED_OUTPATIENT_CLINIC_OR_DEPARTMENT_OTHER): Payer: 59

## 2017-08-20 ENCOUNTER — Ambulatory Visit (HOSPITAL_BASED_OUTPATIENT_CLINIC_OR_DEPARTMENT_OTHER): Payer: 59 | Admitting: Medical

## 2017-08-20 ENCOUNTER — Emergency Department (HOSPITAL_COMMUNITY): Payer: 59

## 2017-08-20 ENCOUNTER — Encounter: Payer: Self-pay | Admitting: Medical

## 2017-08-20 ENCOUNTER — Encounter (HOSPITAL_COMMUNITY): Payer: Self-pay | Admitting: Nurse Practitioner

## 2017-08-20 ENCOUNTER — Ambulatory Visit (HOSPITAL_BASED_OUTPATIENT_CLINIC_OR_DEPARTMENT_OTHER): Payer: 59

## 2017-08-20 VITALS — BP 118/69 | HR 85

## 2017-08-20 VITALS — BP 139/83 | HR 100 | Temp 98.6°F | Resp 18 | Ht 65.0 in | Wt 132.5 lb

## 2017-08-20 DIAGNOSIS — R509 Fever, unspecified: Secondary | ICD-10-CM

## 2017-08-20 DIAGNOSIS — R740 Nonspecific elevation of levels of transaminase and lactic acid dehydrogenase [LDH]: Secondary | ICD-10-CM | POA: Diagnosis not present

## 2017-08-20 DIAGNOSIS — C787 Secondary malignant neoplasm of liver and intrahepatic bile duct: Secondary | ICD-10-CM | POA: Insufficient documentation

## 2017-08-20 DIAGNOSIS — Z79899 Other long term (current) drug therapy: Secondary | ICD-10-CM | POA: Insufficient documentation

## 2017-08-20 DIAGNOSIS — C259 Malignant neoplasm of pancreas, unspecified: Secondary | ICD-10-CM

## 2017-08-20 DIAGNOSIS — N3000 Acute cystitis without hematuria: Secondary | ICD-10-CM | POA: Insufficient documentation

## 2017-08-20 DIAGNOSIS — R1013 Epigastric pain: Secondary | ICD-10-CM | POA: Insufficient documentation

## 2017-08-20 DIAGNOSIS — Z95828 Presence of other vascular implants and grafts: Secondary | ICD-10-CM

## 2017-08-20 LAB — URINALYSIS, ROUTINE W REFLEX MICROSCOPIC
BILIRUBIN URINE: NEGATIVE
GLUCOSE, UA: NEGATIVE mg/dL
KETONES UR: 20 mg/dL — AB
Nitrite: NEGATIVE
PROTEIN: 100 mg/dL — AB
Specific Gravity, Urine: 1.029 (ref 1.005–1.030)
pH: 5 (ref 5.0–8.0)

## 2017-08-20 LAB — COMPREHENSIVE METABOLIC PANEL
ALBUMIN: 3.2 g/dL — AB (ref 3.5–5.0)
ALK PHOS: 455 U/L — AB (ref 38–126)
ALT: 170 U/L — AB (ref 0–55)
ALT: 149 U/L — AB (ref 14–54)
ANION GAP: 8 (ref 5–15)
AST: 221 U/L — AB (ref 5–34)
AST: 210 U/L — AB (ref 15–41)
Albumin: 3.4 g/dL — ABNORMAL LOW (ref 3.5–5.0)
Alkaline Phosphatase: 531 U/L — ABNORMAL HIGH (ref 40–150)
Anion Gap: 10 mEq/L (ref 3–11)
BILIRUBIN TOTAL: 1.92 mg/dL — AB (ref 0.20–1.20)
BUN: 17.5 mg/dL (ref 7.0–26.0)
BUN: 17 mg/dL (ref 6–20)
CALCIUM: 12.2 mg/dL — AB (ref 8.9–10.3)
CO2: 29 meq/L (ref 22–29)
CO2: 26 mmol/L (ref 22–32)
CREATININE: 1.19 mg/dL — AB (ref 0.44–1.00)
Calcium: 14.2 mg/dL (ref 8.4–10.4)
Chloride: 92 mEq/L — ABNORMAL LOW (ref 98–109)
Chloride: 94 mmol/L — ABNORMAL LOW (ref 101–111)
Creatinine: 1.4 mg/dL — ABNORMAL HIGH (ref 0.6–1.1)
EGFR: 44 mL/min/{1.73_m2} — AB (ref >60)
GFR calc Af Amer: 59 mL/min — ABNORMAL LOW (ref >60)
GFR calc non Af Amer: 51 mL/min — ABNORMAL LOW (ref >60)
GLUCOSE: 114 mg/dL (ref 70–140)
GLUCOSE: 108 mg/dL — AB (ref 65–99)
Potassium: 3.6 mEq/L (ref 3.5–5.1)
Potassium: 3.2 mmol/L — ABNORMAL LOW (ref 3.5–5.1)
SODIUM: 132 meq/L — AB (ref 136–145)
SODIUM: 128 mmol/L — AB (ref 135–145)
TOTAL PROTEIN: 7.2 g/dL (ref 6.4–8.3)
Total Bilirubin: 2 mg/dL — ABNORMAL HIGH (ref 0.3–1.2)
Total Protein: 6.8 g/dL (ref 6.5–8.1)

## 2017-08-20 LAB — CBC WITH DIFFERENTIAL/PLATELET
Basophils Absolute: 0 10*3/uL (ref 0.0–0.1)
Basophils Relative: 0 %
EOS ABS: 0 10*3/uL (ref 0.0–0.7)
Eosinophils Relative: 0 %
HCT: 37.1 % (ref 36.0–46.0)
HEMOGLOBIN: 12.3 g/dL (ref 12.0–15.0)
LYMPHS ABS: 0.4 10*3/uL — AB (ref 0.7–4.0)
Lymphocytes Relative: 3 %
MCH: 30.9 pg (ref 26.0–34.0)
MCHC: 33.2 g/dL (ref 30.0–36.0)
MCV: 93.2 fL (ref 78.0–100.0)
MONOS PCT: 5 %
Monocytes Absolute: 0.6 10*3/uL (ref 0.1–1.0)
NEUTROS PCT: 92 %
Neutro Abs: 11.5 10*3/uL — ABNORMAL HIGH (ref 1.7–7.7)
Platelets: 360 10*3/uL (ref 150–400)
RBC: 3.98 MIL/uL (ref 3.87–5.11)
RDW: 14.1 % (ref 11.5–15.5)
WBC: 12.6 10*3/uL — ABNORMAL HIGH (ref 4.0–10.5)

## 2017-08-20 LAB — LIPASE: LIPASE: 853 U/L — AB (ref 14–72)

## 2017-08-20 LAB — PROTIME-INR
INR: 1.16
PROTHROMBIN TIME: 14.7 s (ref 11.4–15.2)

## 2017-08-20 LAB — CANCER ANTIGEN 19-9: CAN 19-9: 155 U/mL — AB (ref 0–35)

## 2017-08-20 LAB — I-STAT CG4 LACTIC ACID, ED: Lactic Acid, Venous: 1.33 mmol/L (ref 0.5–1.9)

## 2017-08-20 MED ORDER — CEPHALEXIN 500 MG PO CAPS: 500.0000 mg | ORAL_CAPSULE | Freq: Four times a day (QID) | ORAL | 0 refills | Status: DC

## 2017-08-20 MED ORDER — ONDANSETRON HCL 8 MG PO TABS
ORAL_TABLET | ORAL | Status: AC
  Filled 2017-08-20: qty 1

## 2017-08-20 MED ORDER — SODIUM CHLORIDE 0.9% FLUSH
10.0000 mL | INTRAVENOUS | Status: DC | PRN
  Administered 2017-08-20: 10 mL via INTRAVENOUS
  Filled 2017-08-20: qty 10

## 2017-08-20 MED ORDER — SODIUM CHLORIDE 0.9 % IV SOLN
Freq: Once | INTRAVENOUS | Status: AC
  Administered 2017-08-20: 15:00:00 via INTRAVENOUS

## 2017-08-20 MED ORDER — ONDANSETRON HCL 8 MG PO TABS
8.0000 mg | ORAL_TABLET | Freq: Once | ORAL | Status: AC
  Administered 2017-08-20: 8 mg via ORAL

## 2017-08-20 MED ORDER — CEFTRIAXONE SODIUM 1 G IJ SOLR
1.0000 g | Freq: Once | INTRAMUSCULAR | Status: AC
  Administered 2017-08-20: 1 g via INTRAVENOUS
  Filled 2017-08-20: qty 10

## 2017-08-20 MED ORDER — HEPARIN SOD (PORK) LOCK FLUSH 100 UNIT/ML IV SOLN
500.0000 [IU] | Freq: Once | INTRAVENOUS | Status: AC
  Administered 2017-08-20: 500 [IU] via INTRAVENOUS
  Filled 2017-08-20: qty 5

## 2017-08-20 MED ORDER — ACETAMINOPHEN 325 MG PO TABS
650.0000 mg | ORAL_TABLET | Freq: Once | ORAL | Status: AC
  Administered 2017-08-20: 650 mg via ORAL
  Filled 2017-08-20: qty 2

## 2017-08-20 MED ORDER — HEPARIN SOD (PORK) LOCK FLUSH 100 UNIT/ML IV SOLN
500.0000 [IU] | Freq: Once | INTRAVENOUS | Status: AC
  Administered 2017-08-21: 500 [IU]
  Filled 2017-08-20: qty 5

## 2017-08-20 NOTE — ED Provider Notes (Signed)
[facility] DEPT Provider Note   CSN: [id number] Arrival date & time: 08/20/17  1850     History   Chief Complaint No chief complaint on file.   HPI Morgan Dawson is a 56 y.o. female.  HPI Patient presents to the emergency room for evaluation of fever.  She has a history of metastatic pancreatic cancer.  Patient is due to start chemotherapy tomorrow.  Patient was seen in the oncology office today.  Patient was noted to have a fever this morning up to 100.5.  She was told to monitor fever and if she had any recurrent symptoms to come to the emergency room.  Patient has a very mild cough but does not think it significant.  She denies any vomiting or diarrhea.  No sore throat.  No rashes.  No dysuria.  She does have pain in her abdomen associated with her pancreatic cancer. Past Medical History:  Diagnosis Date  . Meniere disease 2016  . Pancreatitis     Patient Active Problem List   Diagnosis Date Noted  . Pancreatic cancer metastasized to liver ([place]) 08/11/2017  . Goals of care, counseling/discussion 08/11/2017  . Pancreatic cancer ([place]) 07/29/2017    Past Surgical History:  Procedure Laterality Date  . CERVICAL ABLATION    . ENDOMETRIAL ABLATION  2011  . IR FLUORO GUIDE PORT INSERTION RIGHT  08/12/2017  . IR US GUIDE VASC ACCESS RIGHT  08/12/2017  . MANDIBLE SURGERY  1999    OB History    No data available       Home Medications    Prior to Admission medications   Medication Sig Start Date End Date Taking? Authorizing Provider  bisacodyl (DULCOLAX) 5 MG EC tablet Take 5 mg by mouth daily as needed for mild constipation or moderate constipation.   Yes [provider]  lidocaine-prilocaine (EMLA) cream Apply to affected area once 08/11/17  Yes [name], MD  loratadine (CLARITIN) 10 MG tablet Take 10 mg by mouth daily as needed for allergies.   Yes [provider]  meclizine (ANTIVERT) 25 MG tablet Take 25 mg by  mouth 3 (three) times daily as needed for dizziness.   Yes [provider]  ondansetron (ZOFRAN-ODT) 8 MG disintegrating tablet Take 1 tablet (8 mg total) by mouth every 8 (eight) hours as needed for nausea or vomiting. 08/11/17  Yes [name], MD  polyethylene glycol (MIRALAX / GLYCOLAX) packet Take 17 g by mouth daily as needed for mild constipation.   Yes [provider]  prochlorperazine (COMPAZINE) 10 MG tablet Take 1 tablet (10 mg total) by mouth every 6 (six) hours as needed for nausea or vomiting. 08/11/17  Yes [name], MD  traMADol (ULTRAM) 50 MG tablet Take 1 tablet (50 mg total) by mouth every 6 (six) hours as needed. 08/13/17  Yes Alla Feeling, NP  triamterene-hydrochlorothiazide (DYAZIDE) 37.5-25 MG capsule Take 1 capsule by mouth daily.   Yes [provider]  cephALEXin (KEFLEX) 500 MG capsule Take 1 capsule (500 mg total) by mouth 4 (four) times daily. 08/20/17   Dorie Rank, MD    Family History Family History  Problem Relation Age of Onset  . Cancer Father 66       colon cancer   . Cancer Maternal Aunt        lymphoma   . Cancer Maternal Grandfather 17       colon cancer   . Cancer Cousin  breast cancer     Social History Social History   Tobacco Use  . Smoking status: Never Smoker  . Smokeless tobacco: Never Used  Substance Use Topics  . Alcohol use: No    Frequency: Never  . Drug use: No     Allergies   Patient has no known allergies.   Review of Systems Review of Systems  All other systems reviewed and are negative.    Physical Exam Updated Vital Signs BP 123/75   Pulse 91   Temp (!) 101.7 F (38.7 C) (Rectal)   Resp 18   Wt 59.9 kg (132 lb)   LMP 10/23/2015   SpO2 97%   BMI 21.97 kg/m   Physical Exam  Constitutional: She appears well-developed and well-nourished. No distress.  HENT:  Head: Normocephalic and atraumatic.  Right Ear: External ear normal.  Left Ear: External ear normal.  Eyes:  Conjunctivae are normal. Right eye exhibits no discharge. Left eye exhibits no discharge. No scleral icterus.  Neck: Neck supple. No tracheal deviation present.  Cardiovascular: Normal rate, regular rhythm and intact distal pulses.  Pulmonary/Chest: Effort normal and breath sounds normal. No stridor. No respiratory distress. She has no wheezes. She has no rales.  Abdominal: Soft. Bowel sounds are normal. She exhibits no distension. There is tenderness in the epigastric area. There is no rebound and no guarding.  Musculoskeletal: She exhibits no edema or tenderness.  Neurological: She is alert. She has normal strength. No cranial nerve deficit (no facial droop, extraocular movements intact, no slurred speech) or sensory deficit. She exhibits normal muscle tone. She displays no seizure activity. Coordination normal.  Skin: Skin is warm and dry. No rash noted.  Psychiatric: She has a normal mood and affect.  Nursing note and vitals reviewed.    ED Treatments / Results  Labs (all labs ordered are listed, but only abnormal results are displayed) Labs Reviewed  COMPREHENSIVE METABOLIC PANEL - Abnormal; Notable for the following components:      Result Value   Sodium 128 (*)    Potassium 3.2 (*)    Chloride 94 (*)    Glucose, Bld 108 (*)    Creatinine, Ser 1.19 (*)    Calcium 12.2 (*)    Albumin 3.2 (*)    AST 210 (*)    ALT 149 (*)    Alkaline Phosphatase 455 (*)    Total Bilirubin 2.0 (*)    GFR calc non Af Amer 51 (*)    GFR calc Af Amer 59 (*)    All other components within normal limits  CBC WITH DIFFERENTIAL/PLATELET - Abnormal; Notable for the following components:   WBC 12.6 (*)    Neutro Abs 11.5 (*)    Lymphs Abs 0.4 (*)    All other components within normal limits  URINALYSIS, ROUTINE W REFLEX MICROSCOPIC - Abnormal; Notable for the following components:   Color, Urine AMBER (*)    APPearance HAZY (*)    Hgb urine dipstick MODERATE (*)    Ketones, ur 20 (*)    Protein,  ur 100 (*)    Leukocytes, UA MODERATE (*)    Bacteria, UA RARE (*)    Squamous Epithelial / LPF 0-5 (*)    Non Squamous Epithelial 0-5 (*)    All other components within normal limits  CULTURE, BLOOD (ROUTINE X 2)  CULTURE, BLOOD (ROUTINE X 2)  URINE CULTURE  PROTIME-INR  I-STAT CG4 LACTIC ACID, ED     Radiology Dg Chest 2  View  Result Date: 08/20/2017 CLINICAL DATA:  Pancreatic cancer.  Rule out sepsis EXAM: CHEST  2 VIEW COMPARISON:  None. FINDINGS: Cardiac and mediastinal contours normal. Lungs are clear. Negative for pneumonia or effusion. Port-A-Cath tip SVC IMPRESSION: No active cardiopulmonary disease. Electronically Signed   By: Franchot Gallo M.D.   On: 08/20/2017 19:56    Procedures Procedures (including critical care time)  Medications Ordered in ED Medications  cefTRIAXone (ROCEPHIN) 1 g in dextrose 5 % 50 mL IVPB (0 g Intravenous Stopped 08/20/17 2243)  acetaminophen (TYLENOL) tablet 650 mg (650 mg Oral Given 08/20/17 2224)     Initial Impression / Assessment and Plan / ED Course  I have reviewed the triage vital signs and the nursing notes.  Pertinent labs & imaging results that were available during my care of the patient were reviewed by me and considered in my medical decision making (see chart for details).  Clinical Course as of Aug 21 2347  Fri Aug 20, 2017  2114 Labs reviewed.  Patient's hypercalcemia is improving from previous values.  [JK]  2115 Her elevated LFTs are unchanged.  She does have an elevated white blood cell count of 12.6 but this is decreased from yesterday.  Chest x-ray is negative.  Her urinalysis does suggest a urinary tract infection.  This may be the source of her fever  [JK]  2154 Discussed case with Dr Lindi Adie.  With her fever and infection, would not recommend going in tomorrow for chemotherapy.  [JK]    Clinical Course User Index [JK] Dorie Rank, MD   Patient presented to the emergency room for evaluation of a fever.  Patient  denied any focal symptoms.  She is not having any trouble with nausea or vomiting.  Laboratory tests are notable for a urinalysis consistent with a urinary tract infection.  Patient was given a dose of IV antibiotics.  She is feeling fine.  NO signs of sepsis. She was anxious to go home tonight.  I discussed the case with Dr. Lindi Adie earlier.  He does not think the patient should have chemotherapy tomorrow concerning her fever and infection.  I discussed this with the patient.  She will follow-up with her oncologist  Final Clinical Impressions(s) / ED Diagnoses   Final diagnoses:  Acute cystitis without hematuria    ED Discharge Orders        Ordered    cephALEXin (KEFLEX) 500 MG capsule  4 times daily     08/20/17 2346       Dorie Rank, MD 08/20/17 2349

## 2017-08-20 NOTE — Discharge Instructions (Addendum)
Continue to take tylenol as needed for fever.  Follow up with your oncologist .  Return to the ED for worsening symptoms, weakness, vomiting.

## 2017-08-20 NOTE — Patient Instructions (Signed)
Dehydration, Adult Dehydration is a condition in which there is not enough fluid or water in the body. This happens when you lose more fluids than you take in. Important organs, such as the kidneys, brain, and heart, cannot function without a proper amount of fluids. Any loss of fluids from the body can lead to dehydration. Dehydration can range from mild to severe. This condition should be treated right away to prevent it from becoming severe. What are the causes? This condition may be caused by:  Vomiting.  Diarrhea.  Excessive sweating, such as from heat exposure or exercise.  Not drinking enough fluid, especially: ? When ill. ? While doing activity that requires a lot of energy.  Excessive urination.  Fever.  Infection.  Certain medicines, such as medicines that cause the body to lose excess fluid (diuretics).  Inability to access safe drinking water.  Reduced physical ability to get adequate water and food.  What increases the risk? This condition is more likely to develop in people:  Who have a poorly controlled long-term (chronic) illness, such as diabetes, heart disease, or kidney disease.  Who are age 65 or older.  Who are disabled.  Who live in a place with high altitude.  Who play endurance sports.  What are the signs or symptoms? Symptoms of mild dehydration may include:  Thirst.  Dry lips.  Slightly dry mouth.  Dry, warm skin.  Dizziness. Symptoms of moderate dehydration may include:  Very dry mouth.  Muscle cramps.  Dark urine. Urine may be the color of tea.  Decreased urine production.  Decreased tear production.  Heartbeat that is irregular or faster than normal (palpitations).  Headache.  Light-headedness, especially when you stand up from a sitting position.  Fainting (syncope). Symptoms of severe dehydration may include:  Changes in skin, such as: ? Cold and clammy skin. ? Blotchy (mottled) or pale skin. ? Skin that does  not quickly return to normal after being lightly pinched and released (poor skin turgor).  Changes in body fluids, such as: ? Extreme thirst. ? No tear production. ? Inability to sweat when body temperature is high, such as in hot weather. ? Very little urine production.  Changes in vital signs, such as: ? Weak pulse. ? Pulse that is more than 100 beats a minute when sitting still. ? Rapid breathing. ? Low blood pressure.  Other changes, such as: ? Sunken eyes. ? Cold hands and feet. ? Confusion. ? Lack of energy (lethargy). ? Difficulty waking up from sleep. ? Short-term weight loss. ? Unconsciousness. How is this diagnosed? This condition is diagnosed based on your symptoms and a physical exam. Blood and urine tests may be done to help confirm the diagnosis. How is this treated? Treatment for this condition depends on the severity. Mild or moderate dehydration can often be treated at home. Treatment should be started right away. Do not wait until dehydration becomes severe. Severe dehydration is an emergency and it needs to be treated in a hospital. Treatment for mild dehydration may include:  Drinking more fluids.  Replacing salts and minerals in your blood (electrolytes) that you may have lost. Treatment for moderate dehydration may include:  Drinking an oral rehydration solution (ORS). This is a drink that helps you replace fluids and electrolytes (rehydrate). It can be found at pharmacies and retail stores. Treatment for severe dehydration may include:  Receiving fluids through an IV tube.  Receiving an electrolyte solution through a feeding tube that is passed through your nose   and into your stomach (nasogastric tube, or NG tube).  Correcting any abnormalities in electrolytes.  Treating the underlying cause of dehydration. Follow these instructions at home:  If directed by your health care provider, drink an ORS: ? Make an ORS by following instructions on the  package. ? Start by drinking small amounts, about  cup (120 mL) every 5-10 minutes. ? Slowly increase how much you drink until you have taken the amount recommended by your health care provider.  Drink enough clear fluid to keep your urine clear or pale yellow. If you were told to drink an ORS, finish the ORS first, then start slowly drinking other clear fluids. Drink fluids such as: ? Water. Do not drink only water. Doing that can lead to having too little salt (sodium) in the body (hyponatremia). ? Ice chips. ? Fruit juice that you have added water to (diluted fruit juice). ? Low-calorie sports drinks.  Avoid: ? Alcohol. ? Drinks that contain a lot of sugar. These include high-calorie sports drinks, fruit juice that is not diluted, and soda. ? Caffeine. ? Foods that are greasy or contain a lot of fat or sugar.  Take over-the-counter and prescription medicines only as told by your health care provider.  Do not take sodium tablets. This can lead to having too much sodium in the body (hypernatremia).  Eat foods that contain a healthy balance of electrolytes, such as bananas, oranges, potatoes, tomatoes, and spinach.  Keep all follow-up visits as told by your health care provider. This is important. Contact a health care provider if:  You have abdominal pain that: ? Gets worse. ? Stays in one area (localizes).  You have a rash.  You have a stiff neck.  You are more irritable than usual.  You are sleepier or more difficult to wake up than usual.  You feel weak or dizzy.  You feel very thirsty.  You have urinated only a small amount of very dark urine over 6-8 hours. Get help right away if:  You have symptoms of severe dehydration.  You cannot drink fluids without vomiting.  Your symptoms get worse with treatment.  You have a fever.  You have a severe headache.  You have vomiting or diarrhea that: ? Gets worse. ? Does not go away.  You have blood or green matter  (bile) in your vomit.  You have blood in your stool. This may cause stool to look black and tarry.  You have not urinated in 6-8 hours.  You faint.  Your heart rate while sitting still is over 100 beats a minute.  You have trouble breathing. This information is not intended to replace advice given to you by your health care provider. Make sure you discuss any questions you have with your health care provider. Document Released: 08/10/2005 Document Revised: 03/06/2016 Document Reviewed: 10/04/2015 Elsevier Interactive Patient Education  2018 [employer]  

## 2017-08-20 NOTE — ED Triage Notes (Signed)
Pt reports a recent pancreatic cancer diagnosis that she is scheduled to start treatment tomorrow morning at the cancer center. She called the nurse line at [facility] center and was advised to come in for evaluation for a possible infection due to the sustained fever.

## 2017-08-20 NOTE — Progress Notes (Signed)
[facility] Progress Note   CHIRSTY Dawson [id number] 20-Oct-1960 56 y.o.  Rosey Dawson is managed by Dr. [name]  Actively treated with chemotherapy: The patient is scheduled to begin FOLFIRINOX on 08/21/2017.   Assessment: Plan:    Pancreatic cancer metastasized to liver ([place])  Hypercalcemia - Plan: 0.9 %  sodium chloride infusion, ondansetron (ZOFRAN) tablet 8 mg   Pancreatic cancer metastasized to the liver: The patient is scheduled to receive cycle 1 of FOLFIRINOX on 08/21/2017.  She will have repeat labs drawn on 08/23/2017.  She is scheduled to see Dr. Burr Medico in follow-up on 09/06/2017.  Hypercalcemia.  The patient was noted to have a calcium level of 15.6 yesterday.  She was given Zometa 4 mg IV with 1 L of normal saline.  Her calcium returned at 14.2 today.  She will receive an additional 1 L of normal saline IV today.  She has been told to push fluids.  Fever: The patient had a fever of 100.5 this morning.  She took 500 mg of Tylenol with resolution of her fever.  She has been told to recheck her temperature as needed.  Please see After Visit Summary for patient specific instructions.  Future Appointments  Date Time Provider [place]  08/21/2017  9:00 AM [facility] A2 [facility] None  08/23/2017  2:00 PM [facility] FLUSH NURSE 2 [facility] None  09/06/2017  9:00 AM [facility] LAB 5 [facility] None  09/06/2017  9:15 AM [facility] I27 DNS [facility] None  09/06/2017  9:45 AM [name], MD [facility] None  09/06/2017 10:45 AM [facility] I25 DNS [facility] None  09/06/2017  3:15 PM Karie Mainland, RD [facility] None  09/08/2017 12:30 PM [facility] FLUSH NURSE [facility] None  09/23/2017 10:15 AM [facility] GENETICS [facility] None  09/23/2017 11:00 AM [facility] LAB 4 [facility] None  10/04/2017  1:30 PM [facility] DIAG TOMO 2 [facility]    No orders of the defined types were placed in this encounter.      Subjective:    Patient ID:  Morgan Dawson is a 56 y.o. (DOB 1961-01-13) female.  Chief Complaint: No chief complaint on file.   HPI Morgan Dawson is a 56 year old female with a recent diagnosis of a metastatic pancreatic cancer with metastasis to the liver.  The patient was originally seen by Dr. [name] on 07/30/2017.  She was last seen by Dr. [name] on 08/11/2017.  At that time she reviewed her recent scans which showed diffuse metastatic disease in an enlarged liver.  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  A PET scan had been completed on 07/29/2017 which showed uptake initially in the duodenum which was believed to actually be in the pancreatic tail.  The patient was seen most recently yesterday for an educational visit.  At that time labs were completed which returned with a calcium of 15.6 and a CA-19-9 of 155. The remainder of the patient's labs showed a creatinine of 1.4, total bilirubin of 1.53, alkaline phosphatase of 578, AST of 224, and ALT of 176.  The patient's labs were reviewed with Dr. Julien Nordmann.  The patient was given 1 L of normal saline in addition to Zometa 4 mg IV.  She was told to push fluids.  She presents to the office today with a repeat of her calcium which returned at 14.2.  She reports that she has been pushing fluids.  She reports that  her temperature last evening was 99.4 and repeated at 100.5 this morning.  She contacted the on call nursing assistance line.  She was told to take 650 mg of Tylenol.  She took 500 mg of Tylenol and defervesced.  She has had no more fever.  She has oral dryness and mild fatigue.  She denies any increased constipation over her baseline.  She is scheduled to return tomorrow for cycle 1 of FOLFIRINOX.  Medications: I have reviewed the patient's current medications.  Allergies: No Known Allergies  Past Medical History:  Diagnosis Date  . Meniere disease 2016  .  Pancreatitis     Past Surgical History:  Procedure Laterality Date  . CERVICAL ABLATION    . ENDOMETRIAL ABLATION  2011  . IR FLUORO GUIDE PORT INSERTION RIGHT  08/12/2017  . IR US GUIDE VASC ACCESS RIGHT  08/12/2017  . MANDIBLE SURGERY  1999    Family History  Problem Relation Age of Onset  . Cancer Father 64       colon cancer   . Cancer Maternal Aunt        lymphoma   . Cancer Maternal Grandfather 20       colon cancer   . Cancer Cousin        breast cancer     Social History   Socioeconomic History  . Marital status: Married    Spouse name: Not on file  . Number of children: Not on file  . Years of education: Not on file  . Highest education level: Not on file  Social Needs  . Financial resource strain: Not on file  . Food insecurity - worry: Not on file  . Food insecurity - inability: Not on file  . Transportation needs - medical: Not on file  . Transportation needs - non-medical: Not on file  Occupational History  . Not on file  Tobacco Use  . Smoking status: Never Smoker  . Smokeless tobacco: Never Used  Substance and Sexual Activity  . Alcohol use: No    Frequency: Never  . Drug use: No  . Sexual activity: Not on file  Other Topics Concern  . Not on file  Social History Narrative  . Not on file    Past Medical History, Surgical history, Social history, and Family history were reviewed and updated as appropriate.   Please see review of systems for further details on the patient's review from today.   Review of Systems:  Review of Systems  Constitutional: Positive for fatigue and fever. Negative for appetite change, chills and diaphoresis.  HENT: Negative for trouble swallowing.        Oral dryness  Respiratory: Negative for cough, choking, chest tightness and shortness of breath.   Cardiovascular: Negative for chest pain and palpitations.  Gastrointestinal: Positive for constipation. Negative for diarrhea, nausea and vomiting.    Objective:     Physical Exam:  BP 139/83 (BP Location: Right Arm, Patient Position: Sitting)   Pulse 100   Temp 98.6 F (37 C) (Oral)   Resp 18   Ht 5' 5"  (1.651 m)   Wt 132 lb 8 oz (60.1 kg)   LMP 10/23/2015   SpO2 100%   BMI 22.05 kg/m  ECOG: 1  Physical Exam  Constitutional: No distress.  HENT:  Head: Normocephalic and atraumatic.  Mouth/Throat: Oropharynx is clear and moist. No oropharyngeal exudate.  Eyes: Right eye exhibits no discharge. Left eye exhibits no discharge. No scleral icterus.  Neck: Normal range  of motion. Neck supple.  Cardiovascular: Normal rate, regular rhythm and normal heart sounds. Exam reveals no gallop and no friction rub.  No murmur heard. Pulmonary/Chest: Effort normal and breath sounds normal. No respiratory distress. She has no wheezes. She has no rales.  Abdominal: Soft. Bowel sounds are normal. She exhibits no distension. There is no tenderness. There is no rebound and no guarding.  Lymphadenopathy:    She has no cervical adenopathy.  Neurological: She is alert. Coordination normal.  Skin: Skin is warm and dry. No rash noted. She is not diaphoretic. No erythema.    Lab Review:     Component Value Date/Time   NA 132 (L) 08/20/2017 1257   K 3.6 08/20/2017 1257   CL 106 12/25/2009 1143   CO2 29 08/20/2017 1257   GLUCOSE 114 08/20/2017 1257   BUN 17.5 08/20/2017 1257   CREATININE 1.4 (H) 08/20/2017 1257   CALCIUM 14.2 (HH) 08/20/2017 1257   PROT 7.2 08/20/2017 1257   ALBUMIN 3.4 (L) 08/20/2017 1257   AST 221 (HH) 08/20/2017 1257   ALT 170 (H) 08/20/2017 1257   ALKPHOS 531 (H) 08/20/2017 1257   BILITOT 1.92 (H) 08/20/2017 1257   GFRNONAA >60 12/25/2009 1143   GFRAA  12/25/2009 1143    >60        The eGFR has been calculated using the MDRD equation. This calculation has not been validated in all clinical situations. eGFR's persistently <60 mL/min signify possible Chronic Kidney Disease.       Component Value Date/Time   WBC 14.1 (H)  08/19/2017 1413   WBC 9.8 08/12/2017 1400   RBC 4.48 08/19/2017 1413   RBC 4.24 08/12/2017 1400   HGB 13.7 08/19/2017 1413   HCT 41.3 08/19/2017 1413   PLT 532 (H) 08/19/2017 1413   MCV 92.3 08/19/2017 1413   MCH 30.6 08/19/2017 1413   MCH 30.4 08/12/2017 1400   MCHC 33.1 08/19/2017 1413   MCHC 32.2 08/12/2017 1400   RDW 13.9 08/19/2017 1413   LYMPHSABS 0.9 08/19/2017 1413   MONOABS 1.7 (H) 08/19/2017 1413   EOSABS 0.0 08/19/2017 1413   BASOSABS 0.1 08/19/2017 1413   -------------------------------  Imaging from last 24 hours (if applicable):  Radiology interpretation: Nm Pet Image Initial (pi) Skull Base To Thigh  Addendum Date: 08/09/2017   ADDENDUM REPORT: 08/09/2017 12:49 ADDENDUM: The original report was by Dr. Jazzie Trampe Clines. The following addendum is by Dr. Cadan Maggart Clines: Reviewing this case further, I do feel that there is a distinct possibility that the pancreatic lesion questioned in my original report may be real rather than just physiologic duodenal activity. This lesion is very inconspicuous and obscured by immediately adjacent physiologic activity in the duodenum but based on the some of the positioning of the proximal jejunum I considered more likely than not that this pancreatic tail lesion is real, and may be the primary for the liver lesions. MRI may be confirmatory. We plan to discuss this case further at the GI tumor board conference on 08/11/2017. Electronically Signed   By: Torria Fromer Clines M.D.   On: 08/09/2017 12:49   Result Date: 08/09/2017 CLINICAL DATA:  Initial treatment strategy for metastatic masses in the liver, primary uncertain. EXAM: NUCLEAR MEDICINE PET SKULL BASE TO THIGH TECHNIQUE: 6.5 mCi F-18 FDG was injected intravenously. Full-ring PET imaging was performed from the skull base to thigh after the radiotracer. CT data was obtained and used for attenuation correction and anatomic localization. FASTING BLOOD GLUCOSE:  Value: 117 mg/dl  COMPARISON:  CT scan 09/22/2016 FINDINGS: NECK No hypermetabolic lymph nodes in the neck. Several small hypodense thyroid nodules do not require further workup. CHEST No hypermetabolic mediastinal or hilar nodes. No suspicious pulmonary nodules on the CT data. Mild biapical pleuroparenchymal scarring. ABDOMEN/PELVIS Replacement of much of the liver by hypermetabolic masses with hypermetabolic activity ranging up to 9.8. An index 6.0 by 0.3 cm lateral segment left hepatic lobe mass on image 101/4 has a maximum SUV of 7.4. Peripancreatic node just above the pancreatic tail measures 1.3 cm in short axis and has a maximum standard uptake value of 4.4. There is some activity projecting over the junction of the pancreatic body and tail with maximum SUV 6.3, but on the PET images this seems connected to the third portion of the duodenum and [place] may represent bowel activity is registered along the pancreas rather than a true pancreatic lesion. The spleen and adrenal glands appear normal. No additional hypermetabolic adenopathy is identified. More caudad, the bowel appears normal. Prominent stool throughout the colon favors constipation. 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 No significant abnormal osseous activity. IMPRESSION: 1. Numerous bulky liver masses are hypermetabolic compatible with malignancy. 2. Accentuated activity within or along the pancreatic tail. I suspect that this is probably a physiologic activity in the distal duodenum which is being is registered onto the pancreatic tail. I do not see an obvious pancreatic lesion on the CT scan. Consider pancreatic protocol MRI to further work this up. 3. The peripancreatic lymph node shown above the pancreatic tail is mildly hypermetabolic favoring malignancy. 4.  Prominent stool throughout the colon favors constipation. 5. 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 Electronically Signed: By: Yarah Fuente Clines M.D. On: 07/29/2017 11:39   US Biopsy  (liver)  Result Date: 08/05/2017 INDICATION: No known primary, now with multiple hypermetabolic liver lesions / masses. Please perform ultrasound-guided liver lesion biopsy for tissue diagnostic purposes. EXAM: ULTRASOUND GUIDED LIVER LESION BIOPSY COMPARISON:  PET-CT - 07/29/2017; CT abdomen pelvis - 07/23/2017; right upper quadrant abdominal pain - 07/23/2017 MEDICATIONS: None ANESTHESIA/SEDATION: Fentanyl 100 mcg IV; Versed 2 mg IV Total Moderate Sedation time: 15 minutes. The patient's level of consciousness and vital signs were monitored continuously by radiology nursing throughout the procedure under my direct supervision. COMPLICATIONS: None immediate. PROCEDURE: Informed written consent was obtained from the patient after a discussion of the risks, benefits and alternatives to treatment. The patient understands and consents the procedure. A timeout was performed prior to the initiation of the procedure. Ultrasound scanning was performed of the right upper abdominal quadrant demonstrates multiple mixed echogenic lesions in mass scattered throughout the liver compatible the findings on recent cross-sectional imaging. A dominant at least 5.8 x 4.6 cm mass within the subcapsular aspect of the caudal right lobe of the liver correlating with the lesion seen on preceding abdominal CT image 58, series 9) was targeted for biopsy given lesion location and sonographic window. The procedure was planned. The right upper abdominal quadrant was prepped and draped in the usual sterile fashion. The overlying soft tissues were anesthetized with 1% lidocaine with epinephrine. A 17 gauge, 6.8 cm co-axial needle was advanced into a peripheral aspect of the lesion. This was followed by 5 core biopsies with an 18 gauge core device under direct ultrasound guidance. The coaxial needle tract was embolized with a small amount of Gel-Foam slurry and superficial hemostasis was obtained with manual compression. Post procedural  scanning was negative for definitive area of hemorrhage or additional complication. A dressing  was placed. The patient tolerated the procedure well without immediate post procedural complication. IMPRESSION: Technically successful ultrasound guided core needle biopsy of dominant mass within the caudal aspect of the right lobe of the liver. Electronically Signed   By: Sandi Mariscal M.D.   On: 08/05/2017 09:36   Ir US Guide Vasc Access Right  Result Date: 08/12/2017 INDICATION: 56 year old with metastatic pancreatic cancer. Port-A-Cath needed for therapy. EXAM: FLUOROSCOPIC AND ULTRASOUND GUIDED PLACEMENT OF A SUBCUTANEOUS PORT COMPARISON:  None. MEDICATIONS: Ancef 2 g; The antibiotic was administered within an appropriate time interval prior to skin puncture. ANESTHESIA/SEDATION: Versed 2.25 mg IV; Fentanyl 100 mcg IV; Moderate Sedation Time:  42 minutes The patient was continuously monitored during the procedure by the interventional radiology nurse under my direct supervision. FLUOROSCOPY TIME:  42 seconds, 1 mGy COMPLICATIONS: None immediate. PROCEDURE: The procedure, risks, benefits, and alternatives were explained to the patient. Questions regarding the procedure were encouraged and answered. The patient understands and consents to the procedure. Patient was placed supine on the interventional table. Ultrasound confirmed a patent right internal jugular vein. The right chest and neck were cleaned with a skin antiseptic and a sterile drape was placed. Maximal barrier sterile technique was utilized including caps, mask, sterile gowns, sterile gloves, sterile drape, hand hygiene and skin antiseptic. The right neck was anesthetized with 1% lidocaine. Small incision was made in the right neck with a blade. Micropuncture set was placed in the right internal jugular vein with ultrasound guidance. The micropuncture wire was used for measurement purposes. The right chest was anesthetized with 1% lidocaine. #15 blade  was used to make an incision and a subcutaneous port pocket was formed. [place] was assembled. Subcutaneous tunnel was formed with a stiff tunneling device. The port catheter was brought through the subcutaneous tunnel. The port was placed in the subcutaneous pocket. The micropuncture set was exchanged for a peel-away sheath. The catheter was placed through the peel-away sheath and the tip was at the superior cavoatrial junction. Catheter placement was confirmed with fluoroscopy. The port was accessed and flushed with heparinized saline. The port pocket was closed using two layers of absorbable sutures and Dermabond. The vein skin site was closed using a single layer of absorbable suture and Dermabond. Sterile dressings were applied. Patient tolerated the procedure well without an immediate complication. Ultrasound and fluoroscopic images were taken and saved for this procedure. IMPRESSION: Placement of a subcutaneous port device. Electronically Signed   By: Markus Daft M.D.   On: 08/12/2017 17:40   Ct Abdomen W Wo Contrast  Result Date: 07/23/2017 CLINICAL DATA:  Epigastric pain with abdominal bloating and nausea for 2 weeks. No history of malignancy. Multiple liver lesions on ultrasound. EXAM: CT ABDOMEN WITHOUT AND WITH CONTRAST TECHNIQUE: Multidetector CT imaging of the abdomen was performed following the standard protocol before and following the bolus administration of intravenous contrast. CONTRAST:  142m ISOVUE-300 IOPAMIDOL (ISOVUE-300) INJECTION 61% COMPARISON:  Ultrasound same date. FINDINGS: Lower chest: Clear lung bases. No significant pleural or pericardial effusion. Hepatobiliary: The liver is enlarged by multiple heterogeneously enhancing masses. There is one lesion inferiorly in the right hepatic lobe (segment 5) which is probably a hemangioma, measuring 4.2 x 3.2 cm on image 75 of series 9. However, there are numerous other enhancing lesions which are most consistent with  metastatic disease. These include a 5.3 x 7.8 cm lesion in segment 4A (image 32) and a 4.9 x 5.8 cm lesion in segment 6 (image 66). No  definite morphologic changes of cirrhosis. Probable small gallstones or polyps. No gallbladder wall thickening or biliary dilatation. Pancreas: No intrinsic pancreatic lesions are identified. There is no pancreatic ductal dilatation or surrounding inflammation. Spleen: Normal in size without focal abnormality. Adrenals/Urinary Tract: Both adrenal glands appear normal. The kidneys appear normal without evidence of urinary tract calculus or hydronephrosis. Bladder not imaged. Stomach/Bowel: No evidence of bowel wall thickening, distention or surrounding inflammatory change.The pelvis was not imaged. Vascular/Lymphatic: 1.5 x 1.3 cm soft tissue nodule superior to the pancreatic tail on image 40 of series 9 is probably a lymph node. There are small lymph nodes the porta hepatis. No enlarged retroperitoneal lymph nodes are seen. No significant vascular findings. The portal, superior mesenteric and splenic veins are patent. Other: No ascites or peritoneal nodularity. Musculoskeletal: No acute or significant osseous findings. There is nodularity within the visualized breasts without definite enhancement. IMPRESSION: 1. Widespread metastatic disease throughout the liver. No clear primary malignancy identified in the abdomen. The pelvis was not imaged. Tissue sampling recommended. 2. Probable adenopathy superior to the pancreatic tail. No evidence of pancreatic mass. 3. Suspected incidental hemangioma inferiorly in the right hepatic lobe. 4. Nonspecific nodularity in the breasts. The patient has undergone recent (03/25/2017 and 04/01/2017) mammography and ultrasound. Electronically Signed   By: Richardean Sale M.D.   On: 07/23/2017 17:47   Ir Fluoro Guide Port Insertion Right  Result Date: 08/12/2017 INDICATION: 56 year old with metastatic pancreatic cancer. Port-A-Cath needed for  therapy. EXAM: FLUOROSCOPIC AND ULTRASOUND GUIDED PLACEMENT OF A SUBCUTANEOUS PORT COMPARISON:  None. MEDICATIONS: Ancef 2 g; The antibiotic was administered within an appropriate time interval prior to skin puncture. ANESTHESIA/SEDATION: Versed 2.25 mg IV; Fentanyl 100 mcg IV; Moderate Sedation Time:  42 minutes The patient was continuously monitored during the procedure by the interventional radiology nurse under my direct supervision. FLUOROSCOPY TIME:  42 seconds, 1 mGy COMPLICATIONS: None immediate. PROCEDURE: The procedure, risks, benefits, and alternatives were explained to the patient. Questions regarding the procedure were encouraged and answered. The patient understands and consents to the procedure. Patient was placed supine on the interventional table. Ultrasound confirmed a patent right internal jugular vein. The right chest and neck were cleaned with a skin antiseptic and a sterile drape was placed. Maximal barrier sterile technique was utilized including caps, mask, sterile gowns, sterile gloves, sterile drape, hand hygiene and skin antiseptic. The right neck was anesthetized with 1% lidocaine. Small incision was made in the right neck with a blade. Micropuncture set was placed in the right internal jugular vein with ultrasound guidance. The micropuncture wire was used for measurement purposes. The right chest was anesthetized with 1% lidocaine. #15 blade was used to make an incision and a subcutaneous port pocket was formed. [place] was assembled. Subcutaneous tunnel was formed with a stiff tunneling device. The port catheter was brought through the subcutaneous tunnel. The port was placed in the subcutaneous pocket. The micropuncture set was exchanged for a peel-away sheath. The catheter was placed through the peel-away sheath and the tip was at the superior cavoatrial junction. Catheter placement was confirmed with fluoroscopy. The port was accessed and flushed with heparinized saline.  The port pocket was closed using two layers of absorbable sutures and Dermabond. The vein skin site was closed using a single layer of absorbable suture and Dermabond. Sterile dressings were applied. Patient tolerated the procedure well without an immediate complication. Ultrasound and fluoroscopic images were taken and saved for this procedure. IMPRESSION: Placement of a  subcutaneous port device. Electronically Signed   By: Markus Daft M.D.   On: 08/12/2017 17:40   US Abdomen Limited Ruq  Result Date: 07/23/2017 CLINICAL DATA:  56 y/o F; postprandial right upper quadrant abdominal pain for 2 weeks. Transaminitis. EXAM: ULTRASOUND ABDOMEN LIMITED RIGHT UPPER QUADRANT COMPARISON:  None. FINDINGS: Gallbladder: No gallbladder wall thickening or pericholecystic fluid. Negative sonographic Murphy's sign. Multiple gallstones measuring up to 7 mm including a stone at the gallbladder neck. Common bile duct: Diameter: 2.4 mm Liver: Multiple solid masses in the liver measuring up to 6.5 cm in the left lobe and in the right lobe measuring up to 5.1 and 3.7 cm. Portal vein is patent on color Doppler imaging with normal direction of blood flow towards the liver. IMPRESSION: 1. Cholelithiasis.  No secondary signs of acute cholecystitis. 2. Multiple solid liver masses measuring up to 6.5 cm in the left lobe of the liver, evaluation for metastatic disease is recommended. These results will be called to the ordering clinician or representative by the Radiologist Assistant, and communication documented in the PACS or zVision Dashboard. Electronically Signed   By: Kristine Garbe M.D.   On: 07/23/2017 14:35        This patient was seen with Dr. Burr Medico with my treatment plan reviewed with her. She expressed agreement with my medical management of this patient.  ADDENDUM I have seen the patient, examined her. I agree with the assessment and and plan and have edited the notes.   Mrs Mousseau has developed severe cancer  related hypercalcemia, but asymptomatic, we are trying to manage it as outpatient.  She has received Zometa and IV fluids yesterday, her calcium level is trending down, her mild acute kidney failure is also improving.  We will give additional IV fluids today, I encouraged her to drink fluids adequately.  She also has significant transaminitis and mild hyperbilirubinemia, secondary to diffuse liver metastasis.  She is a bit more fatigued, based on her performance status and organ functions, I will reduce her first cycle oxaliplatin and irinotecan dose by 20%, omit 5-FU bolus.  She will start first cycle of FOLFIRINOX tomorrow.  Chemo consent was obtained.  She has had chemo class.  She will return on Monday for repeat lab.  [name]  08/20/2017

## 2017-08-21 ENCOUNTER — Ambulatory Visit: Payer: 59

## 2017-08-21 ENCOUNTER — Encounter: Payer: Self-pay | Admitting: Medical

## 2017-08-23 ENCOUNTER — Ambulatory Visit (HOSPITAL_BASED_OUTPATIENT_CLINIC_OR_DEPARTMENT_OTHER): Payer: 59

## 2017-08-23 ENCOUNTER — Other Ambulatory Visit: Payer: Self-pay | Admitting: *Deleted

## 2017-08-23 DIAGNOSIS — E876 Hypokalemia: Secondary | ICD-10-CM

## 2017-08-23 DIAGNOSIS — C259 Malignant neoplasm of pancreas, unspecified: Secondary | ICD-10-CM | POA: Diagnosis not present

## 2017-08-23 DIAGNOSIS — Z5111 Encounter for antineoplastic chemotherapy: Secondary | ICD-10-CM | POA: Diagnosis not present

## 2017-08-23 DIAGNOSIS — Z7189 Other specified counseling: Secondary | ICD-10-CM

## 2017-08-23 DIAGNOSIS — C251 Malignant neoplasm of body of pancreas: Secondary | ICD-10-CM | POA: Diagnosis not present

## 2017-08-23 DIAGNOSIS — C787 Secondary malignant neoplasm of liver and intrahepatic bile duct: Secondary | ICD-10-CM | POA: Diagnosis not present

## 2017-08-23 LAB — COMPREHENSIVE METABOLIC PANEL
ALT: 151 U/L — ABNORMAL HIGH (ref 0–55)
AST: 208 U/L (ref 5–34)
Albumin: 2.9 g/dL — ABNORMAL LOW (ref 3.5–5.0)
Alkaline Phosphatase: 549 U/L — ABNORMAL HIGH (ref 40–150)
Anion Gap: 9 mEq/L (ref 3–11)
BUN: 11.6 mg/dL (ref 7.0–26.0)
CHLORIDE: 99 meq/L (ref 98–109)
CO2: 26 meq/L (ref 22–29)
Calcium: 10.4 mg/dL (ref 8.4–10.4)
Creatinine: 1 mg/dL (ref 0.6–1.1)
GLUCOSE: 116 mg/dL (ref 70–140)
SODIUM: 134 meq/L — AB (ref 136–145)
TOTAL PROTEIN: 6.4 g/dL (ref 6.4–8.3)
Total Bilirubin: 0.82 mg/dL (ref 0.20–1.20)

## 2017-08-23 LAB — CBC WITH DIFFERENTIAL/PLATELET
BASO%: 0.2 % (ref 0.0–2.0)
BASOS ABS: 0 10*3/uL (ref 0.0–0.1)
EOS ABS: 0.1 10*3/uL (ref 0.0–0.5)
EOS%: 1.1 % (ref 0.0–7.0)
HEMATOCRIT: 36 % (ref 34.8–46.6)
HEMOGLOBIN: 11.9 g/dL (ref 11.6–15.9)
LYMPH#: 0.8 10*3/uL — AB (ref 0.9–3.3)
LYMPH%: 9.7 % — ABNORMAL LOW (ref 14.0–49.7)
MCH: 30.7 pg (ref 25.1–34.0)
MCHC: 33.1 g/dL (ref 31.5–36.0)
MCV: 92.8 fL (ref 79.5–101.0)
MONO#: 1.3 10*3/uL — AB (ref 0.1–0.9)
MONO%: 15.4 % — ABNORMAL HIGH (ref 0.0–14.0)
NEUT#: 6.1 10*3/uL (ref 1.5–6.5)
NEUT%: 73.6 % (ref 38.4–76.8)
NRBC: 0 % (ref 0–0)
Platelets: 401 10*3/uL — ABNORMAL HIGH (ref 145–400)
RBC: 3.88 10*6/uL (ref 3.70–5.45)
RDW: 14.2 % (ref 11.2–14.5)
WBC: 8.3 10*3/uL (ref 3.9–10.3)

## 2017-08-23 MED ORDER — DEXAMETHASONE SODIUM PHOSPHATE 10 MG/ML IJ SOLN
INTRAMUSCULAR | Status: AC
  Filled 2017-08-23: qty 1

## 2017-08-23 MED ORDER — POTASSIUM CHLORIDE CRYS ER 20 MEQ PO TBCR: 20.0000 meq | EXTENDED_RELEASE_TABLET | Freq: Two times a day (BID) | ORAL | 0 refills | Status: DC

## 2017-08-23 MED ORDER — ATROPINE SULFATE 1 MG/ML IJ SOLN
INTRAMUSCULAR | Status: AC
  Filled 2017-08-23: qty 1

## 2017-08-23 MED ORDER — SODIUM CHLORIDE 0.9% FLUSH
10.0000 mL | INTRAVENOUS | Status: DC | PRN
  Filled 2017-08-23: qty 10

## 2017-08-23 MED ORDER — OXALIPLATIN CHEMO INJECTION 100 MG/20ML
70.0000 mg/m2 | Freq: Once | INTRAVENOUS | Status: AC
  Administered 2017-08-23: 115 mg via INTRAVENOUS
  Filled 2017-08-23: qty 23

## 2017-08-23 MED ORDER — PALONOSETRON HCL INJECTION 0.25 MG/5ML
0.2500 mg | Freq: Once | INTRAVENOUS | Status: AC
  Administered 2017-08-23: 0.25 mg via INTRAVENOUS

## 2017-08-23 MED ORDER — LEUCOVORIN CALCIUM INJECTION 350 MG
400.0000 mg/m2 | Freq: Once | INTRAVENOUS | Status: AC
  Administered 2017-08-23: 668 mg via INTRAVENOUS
  Filled 2017-08-23: qty 33.4

## 2017-08-23 MED ORDER — POTASSIUM CHLORIDE CRYS ER 20 MEQ PO TBCR
20.0000 meq | EXTENDED_RELEASE_TABLET | Freq: Once | ORAL | Status: AC
  Administered 2017-08-23: 20 meq via ORAL
  Filled 2017-08-23: qty 1

## 2017-08-23 MED ORDER — DEXTROSE 5 % IV SOLN
150.0000 mg/m2 | Freq: Once | INTRAVENOUS | Status: AC
  Administered 2017-08-23: 260 mg via INTRAVENOUS
  Filled 2017-08-23: qty 13

## 2017-08-23 MED ORDER — PALONOSETRON HCL INJECTION 0.25 MG/5ML
INTRAVENOUS | Status: AC
  Filled 2017-08-23: qty 5

## 2017-08-23 MED ORDER — DEXTROSE 5 % IV SOLN
Freq: Once | INTRAVENOUS | Status: AC
  Administered 2017-08-23: 10:00:00 via INTRAVENOUS

## 2017-08-23 MED ORDER — ATROPINE SULFATE 1 MG/ML IJ SOLN
0.5000 mg | Freq: Once | INTRAMUSCULAR | Status: AC | PRN
  Administered 2017-08-23: 0.5 mg via INTRAVENOUS

## 2017-08-23 MED ORDER — POTASSIUM CHLORIDE CRYS ER 20 MEQ PO TBCR
20.0000 meq | EXTENDED_RELEASE_TABLET | ORAL | Status: DC
  Administered 2017-08-23: 20 meq via ORAL
  Filled 2017-08-23: qty 2

## 2017-08-23 MED ORDER — SODIUM CHLORIDE 0.9 % IV SOLN
2400.0000 mg/m2 | INTRAVENOUS | Status: DC
  Administered 2017-08-23: 4000 mg via INTRAVENOUS
  Filled 2017-08-23: qty 80

## 2017-08-23 MED ORDER — DEXAMETHASONE SODIUM PHOSPHATE 10 MG/ML IJ SOLN
10.0000 mg | Freq: Once | INTRAMUSCULAR | Status: AC
  Administered 2017-08-23: 10 mg via INTRAVENOUS

## 2017-08-23 NOTE — Progress Notes (Signed)
  Oncology Nurse Navigator Documentation  Navigator Location: [facility] (08/23/17 1318)   )Navigator Encounter Type: Treatment (08/23/17 1318)                       Treatment Phase: First Chemo Tx (08/23/17 1318)     Interventions: Psycho-social support (08/23/17 1318)  Met with patient and her son, Marjory Lies, to offer emotional support. No navigation needs at this time.          Acuity: Level 2 (08/23/17 1318)   Acuity Level 2: Educational needs;Ongoing guidance and education throughout treatment as needed (08/23/17 1318)     Time Spent with Patient: 30 (08/23/17 1318)

## 2017-08-23 NOTE — Patient Instructions (Signed)
[facility] Discharge Instructions for Patients Receiving Chemotherapy  Today you received the following chemotherapy agents Oxaliplatin, leucovorin, Irinotecan, Adrucil  To help prevent nausea and vomiting after your treatment, we encourage you to take your nausea medication as directed   If you develop nausea and vomiting that is not controlled by your nausea medication, call the clinic.   BELOW ARE SYMPTOMS THAT SHOULD BE REPORTED IMMEDIATELY:  *FEVER GREATER THAN 100.5 F  *CHILLS WITH OR WITHOUT FEVER  NAUSEA AND VOMITING THAT IS NOT CONTROLLED WITH YOUR NAUSEA MEDICATION  *UNUSUAL SHORTNESS OF BREATH  *UNUSUAL BRUISING OR BLEEDING  TENDERNESS IN MOUTH AND THROAT WITH OR WITHOUT PRESENCE OF ULCERS  *URINARY PROBLEMS  *BOWEL PROBLEMS  UNUSUAL RASH Items with * indicate a potential emergency and should be followed up as soon as possible.  Feel free to call the clinic should you have any questions or concerns. The clinic phone number is [phone].  Please show the [facility] at check-in to the Emergency Department and triage nurse.   Oxaliplatin Injection What is this medicine? OXALIPLATIN (ox AL i PLA tin) is a chemotherapy drug. It targets fast dividing cells, like cancer cells, and causes these cells to die. This medicine is used to treat cancers of the colon and rectum, and many other cancers. This medicine may be used for other purposes; ask your health care provider or pharmacist if you have questions. COMMON BRAND NAME(S): Eloxatin What should I tell my health care provider before I take this medicine? They need to know if you have any of these conditions: -kidney disease -an unusual or allergic reaction to oxaliplatin, other chemotherapy, other medicines, foods, dyes, or preservatives -pregnant or trying to get pregnant -breast-feeding How should I use this medicine? This drug is given as an infusion into a vein. It is administered in a  hospital or clinic by a specially trained health care professional. Talk to your pediatrician regarding the use of this medicine in children. Special care may be needed. Overdosage: If you think you have taken too much of this medicine contact a poison control center or emergency room at once. NOTE: This medicine is only for you. Do not share this medicine with others. What if I miss a dose? It is important not to miss a dose. Call your doctor or health care professional if you are unable to keep an appointment. What may interact with this medicine? -medicines to increase blood counts like filgrastim, pegfilgrastim, sargramostim -probenecid -some antibiotics like amikacin, gentamicin, neomycin, polymyxin B, streptomycin, tobramycin -zalcitabine Talk to your doctor or health care professional before taking any of these medicines: -acetaminophen -aspirin -ibuprofen -ketoprofen -naproxen This list may not describe all possible interactions. Give your health care provider a list of all the medicines, herbs, non-prescription drugs, or dietary supplements you use. Also tell them if you smoke, drink alcohol, or use illegal drugs. Some items may interact with your medicine. What should I watch for while using this medicine? Your condition will be monitored carefully while you are receiving this medicine. You will need important blood work done while you are taking this medicine. This medicine can make you more sensitive to cold. Do not drink cold drinks or use ice. Cover exposed skin before coming in contact with cold temperatures or cold objects. When out in cold weather wear warm clothing and cover your mouth and nose to warm the air that goes into your lungs. Tell your doctor if you get sensitive to the  cold. This drug may make you feel generally unwell. This is not uncommon, as chemotherapy can affect healthy cells as well as cancer cells. Report any side effects. Continue your course of treatment  even though you feel ill unless your doctor tells you to stop. In some cases, you may be given additional medicines to help with side effects. Follow all directions for their use. Call your doctor or health care professional for advice if you get a fever, chills or sore throat, or other symptoms of a cold or flu. Do not treat yourself. This drug decreases your body's ability to fight infections. Try to avoid being around people who are sick. This medicine may increase your risk to bruise or bleed. Call your doctor or health care professional if you notice any unusual bleeding. Be careful brushing and flossing your teeth or using a toothpick because you may get an infection or bleed more easily. If you have any dental work done, tell your dentist you are receiving this medicine. Avoid taking products that contain aspirin, acetaminophen, ibuprofen, naproxen, or ketoprofen unless instructed by your doctor. These medicines may hide a fever. Do not become pregnant while taking this medicine. Women should inform their doctor if they wish to become pregnant or think they might be pregnant. There is a potential for serious side effects to an unborn child. Talk to your health care professional or pharmacist for more information. Do not breast-feed an infant while taking this medicine. Call your doctor or health care professional if you get diarrhea. Do not treat yourself. What side effects may I notice from receiving this medicine? Side effects that you should report to your doctor or health care professional as soon as possible: -allergic reactions like skin rash, itching or hives, swelling of the face, lips, or tongue -low blood counts - This drug may decrease the number of white blood cells, red blood cells and platelets. You may be at increased risk for infections and bleeding. -signs of infection - fever or chills, cough, sore throat, pain or difficulty passing urine -signs of decreased platelets or  bleeding - bruising, pinpoint red spots on the skin, black, tarry stools, nosebleeds -signs of decreased red blood cells - unusually weak or tired, fainting spells, lightheadedness -breathing problems -chest pain, pressure -cough -diarrhea -jaw tightness -mouth sores -nausea and vomiting -pain, swelling, redness or irritation at the injection site -pain, tingling, numbness in the hands or feet -problems with balance, talking, walking -redness, blistering, peeling or loosening of the skin, including inside the mouth -trouble passing urine or change in the amount of urine Side effects that usually do not require medical attention (report to your doctor or health care professional if they continue or are bothersome): -changes in vision -constipation -hair loss -loss of appetite -metallic taste in the mouth or changes in taste -stomach pain This list may not describe all possible side effects. Call your doctor for medical advice about side effects. You may report side effects to FDA at 1-800-FDA-1088. Where should I keep my medicine? This drug is given in a hospital or clinic and will not be stored at home. NOTE: This sheet is a summary. It may not cover all possible information. If you have questions about this medicine, talk to your doctor, pharmacist, or health care provider.  2018 Elsevier/Gold Standard (2008-03-06 17:22:47)   Leucovorin injection What is this medicine? LEUCOVORIN (loo koe VOR in) is used to prevent or treat the harmful effects of some medicines. This medicine is used  to treat anemia caused by a low amount of folic acid in the body. It is also used with 5-fluorouracil (5-FU) to treat colon cancer. This medicine may be used for other purposes; ask your health care provider or pharmacist if you have questions. What should I tell my health care provider before I take this medicine? They need to know if you have any of these conditions: -anemia from low levels of  vitamin B-12 in the blood -an unusual or allergic reaction to leucovorin, folic acid, other medicines, foods, dyes, or preservatives -pregnant or trying to get pregnant -breast-feeding How should I use this medicine? This medicine is for injection into a muscle or into a vein. It is given by a health care professional in a hospital or clinic setting. Talk to your pediatrician regarding the use of this medicine in children. Special care may be needed. Overdosage: If you think you have taken too much of this medicine contact a poison control center or emergency room at once. NOTE: This medicine is only for you. Do not share this medicine with others. What if I miss a dose? This does not apply. What may interact with this medicine? -capecitabine -fluorouracil -phenobarbital -phenytoin -primidone -trimethoprim-sulfamethoxazole This list may not describe all possible interactions. Give your health care provider a list of all the medicines, herbs, non-prescription drugs, or dietary supplements you use. Also tell them if you smoke, drink alcohol, or use illegal drugs. Some items may interact with your medicine. What should I watch for while using this medicine? Your condition will be monitored carefully while you are receiving this medicine. This medicine may increase the side effects of 5-fluorouracil, 5-FU. Tell your doctor or health care professional if you have diarrhea or mouth sores that do not get better or that get worse. What side effects may I notice from receiving this medicine? Side effects that you should report to your doctor or health care professional as soon as possible: -allergic reactions like skin rash, itching or hives, swelling of the face, lips, or tongue -breathing problems -fever, infection -mouth sores -unusual bleeding or bruising -unusually weak or tired Side effects that usually do not require medical attention (report to your doctor or health care professional if  they continue or are bothersome): -constipation or diarrhea -loss of appetite -nausea, vomiting This list may not describe all possible side effects. Call your doctor for medical advice about side effects. You may report side effects to FDA at 1-800-FDA-1088. Where should I keep my medicine? This drug is given in a hospital or clinic and will not be stored at home. NOTE: This sheet is a summary. It may not cover all possible information. If you have questions about this medicine, talk to your doctor, pharmacist, or health care provider.  2018 Elsevier/Gold Standard (2008-02-14 16:50:29)  Irinotecan injection What is this medicine? IRINOTECAN (ir in oh TEE kan ) is a chemotherapy drug. It is used to treat colon and rectal cancer. This medicine may be used for other purposes; ask your health care provider or pharmacist if you have questions. COMMON BRAND NAME(S): Camptosar What should I tell my health care provider before I take this medicine? They need to know if you have any of these conditions: -blood disorders -dehydration -diarrhea -infection (especially a virus infection such as chickenpox, cold sores, or herpes) -liver disease -low blood counts, like low white cell, platelet, or red cell counts -recent or ongoing radiation therapy -an unusual or allergic reaction to irinotecan, sorbitol, other chemotherapy, other  medicines, foods, dyes, or preservatives -pregnant or trying to get pregnant -breast-feeding How should I use this medicine? This drug is given as an infusion into a vein. It is administered in a hospital or clinic by a specially trained health care professional. Talk to your pediatrician regarding the use of this medicine in children. Special care may be needed. Overdosage: If you think you have taken too much of this medicine contact a poison control center or emergency room at once. NOTE: This medicine is only for you. Do not share this medicine with others. What if  I miss a dose? It is important not to miss your dose. Call your doctor or health care professional if you are unable to keep an appointment. What may interact with this medicine? Do not take this medicine with any of the following medications: -atazanavir -certain medicines for fungal infections like itraconazole and ketoconazole -St. John's Wort This medicine may also interact with the following medications: -dexamethasone -diuretics -laxatives -medicines for seizures like carbamazepine, mephobarbital, phenobarbital, phenytoin, primidone -medicines to increase blood counts like filgrastim, pegfilgrastim, sargramostim -prochlorperazine -vaccines This list may not describe all possible interactions. Give your health care provider a list of all the medicines, herbs, non-prescription drugs, or dietary supplements you use. Also tell them if you smoke, drink alcohol, or use illegal drugs. Some items may interact with your medicine. What should I watch for while using this medicine? Your condition will be monitored carefully while you are receiving this medicine. You will need important blood work done while you are taking this medicine. This drug may make you feel generally unwell. This is not uncommon, as chemotherapy can affect healthy cells as well as cancer cells. Report any side effects. Continue your course of treatment even though you feel ill unless your doctor tells you to stop. In some cases, you may be given additional medicines to help with side effects. Follow all directions for their use. You may get drowsy or dizzy. Do not drive, use machinery, or do anything that needs mental alertness until you know how this medicine affects you. Do not stand or sit up quickly, especially if you are an older patient. This reduces the risk of dizzy or fainting spells. Call your doctor or health care professional for advice if you get a fever, chills or sore throat, or other symptoms of a cold or flu.  Do not treat yourself. This drug decreases your body's ability to fight infections. Try to avoid being around people who are sick. This medicine may increase your risk to bruise or bleed. Call your doctor or health care professional if you notice any unusual bleeding. Be careful brushing and flossing your teeth or using a toothpick because you may get an infection or bleed more easily. If you have any dental work done, tell your dentist you are receiving this medicine. Avoid taking products that contain aspirin, acetaminophen, ibuprofen, naproxen, or ketoprofen unless instructed by your doctor. These medicines may hide a fever. Do not become pregnant while taking this medicine. Women should inform their doctor if they wish to become pregnant or think they might be pregnant. There is a potential for serious side effects to an unborn child. Talk to your health care professional or pharmacist for more information. Do not breast-feed an infant while taking this medicine. What side effects may I notice from receiving this medicine? Side effects that you should report to your doctor or health care professional as soon as possible: -allergic reactions like skin rash,  itching or hives, swelling of the face, lips, or tongue -low blood counts - this medicine may decrease the number of white blood cells, red blood cells and platelets. You may be at increased risk for infections and bleeding. -signs of infection - fever or chills, cough, sore throat, pain or difficulty passing urine -signs of decreased platelets or bleeding - bruising, pinpoint red spots on the skin, black, tarry stools, blood in the urine -signs of decreased red blood cells - unusually weak or tired, fainting spells, lightheadedness -breathing problems -chest pain -diarrhea -feeling faint or lightheaded, falls -flushing, runny nose, sweating during infusion -mouth sores or pain -pain, swelling, redness or irritation where injected -pain,  swelling, warmth in the leg -pain, tingling, numbness in the hands or feet -problems with balance, talking, walking -stomach cramps, pain -trouble passing urine or change in the amount of urine -vomiting as to be unable to hold down drinks or food -yellowing of the eyes or skin Side effects that usually do not require medical attention (report to your doctor or health care professional if they continue or are bothersome): -constipation -hair loss -headache -loss of appetite -nausea, vomiting -stomach upset This list may not describe all possible side effects. Call your doctor for medical advice about side effects. You may report side effects to FDA at 1-800-FDA-1088. Where should I keep my medicine? This drug is given in a hospital or clinic and will not be stored at home. NOTE: This sheet is a summary. It may not cover all possible information. If you have questions about this medicine, talk to your doctor, pharmacist, or health care provider.  2018 Elsevier/Gold Standard (2013-02-06 16:29:32)  Fluorouracil, 5-FU injection What is this medicine? FLUOROURACIL, 5-FU (flure oh YOOR a sil) is a chemotherapy drug. It slows the growth of cancer cells. This medicine is used to treat many types of cancer like breast cancer, colon or rectal cancer, pancreatic cancer, and stomach cancer. This medicine may be used for other purposes; ask your health care provider or pharmacist if you have questions. COMMON BRAND NAME(S): Adrucil What should I tell my health care provider before I take this medicine? They need to know if you have any of these conditions: -blood disorders -dihydropyrimidine dehydrogenase (DPD) deficiency -infection (especially a virus infection such as chickenpox, cold sores, or herpes) -kidney disease -liver disease -malnourished, poor nutrition -recent or ongoing radiation therapy -an unusual or allergic reaction to fluorouracil, other chemotherapy, other medicines, foods,  dyes, or preservatives -pregnant or trying to get pregnant -breast-feeding How should I use this medicine? This drug is given as an infusion or injection into a vein. It is administered in a hospital or clinic by a specially trained health care professional. Talk to your pediatrician regarding the use of this medicine in children. Special care may be needed. Overdosage: If you think you have taken too much of this medicine contact a poison control center or emergency room at once. NOTE: This medicine is only for you. Do not share this medicine with others. What if I miss a dose? It is important not to miss your dose. Call your doctor or health care professional if you are unable to keep an appointment. What may interact with this medicine? -allopurinol -cimetidine -dapsone -digoxin -hydroxyurea -leucovorin -levamisole -medicines for seizures like ethotoin, fosphenytoin, phenytoin -medicines to increase blood counts like filgrastim, pegfilgrastim, sargramostim -medicines that treat or prevent blood clots like warfarin, enoxaparin, and dalteparin -methotrexate -metronidazole -pyrimethamine -some other chemotherapy drugs like busulfan, cisplatin, estramustine, vinblastine -  trimethoprim -trimetrexate -vaccines Talk to your doctor or health care professional before taking any of these medicines: -acetaminophen -aspirin -ibuprofen -ketoprofen -naproxen This list may not describe all possible interactions. Give your health care provider a list of all the medicines, herbs, non-prescription drugs, or dietary supplements you use. Also tell them if you smoke, drink alcohol, or use illegal drugs. Some items may interact with your medicine. What should I watch for while using this medicine? Visit your doctor for checks on your progress. This drug may make you feel generally unwell. This is not uncommon, as chemotherapy can affect healthy cells as well as cancer cells. Report any side effects.  Continue your course of treatment even though you feel ill unless your doctor tells you to stop. In some cases, you may be given additional medicines to help with side effects. Follow all directions for their use. Call your doctor or health care professional for advice if you get a fever, chills or sore throat, or other symptoms of a cold or flu. Do not treat yourself. This drug decreases your body's ability to fight infections. Try to avoid being around people who are sick. This medicine may increase your risk to bruise or bleed. Call your doctor or health care professional if you notice any unusual bleeding. Be careful brushing and flossing your teeth or using a toothpick because you may get an infection or bleed more easily. If you have any dental work done, tell your dentist you are receiving this medicine. Avoid taking products that contain aspirin, acetaminophen, ibuprofen, naproxen, or ketoprofen unless instructed by your doctor. These medicines may hide a fever. Do not become pregnant while taking this medicine. Women should inform their doctor if they wish to become pregnant or think they might be pregnant. There is a potential for serious side effects to an unborn child. Talk to your health care professional or pharmacist for more information. Do not breast-feed an infant while taking this medicine. Men should inform their doctor if they wish to father a child. This medicine may lower sperm counts. Do not treat diarrhea with over the counter products. Contact your doctor if you have diarrhea that lasts more than 2 days or if it is severe and watery. This medicine can make you more sensitive to the sun. Keep out of the sun. If you cannot avoid being in the sun, wear protective clothing and use sunscreen. Do not use sun lamps or tanning beds/booths. What side effects may I notice from receiving this medicine? Side effects that you should report to your doctor or health care professional as soon as  possible: -allergic reactions like skin rash, itching or hives, swelling of the face, lips, or tongue -low blood counts - this medicine may decrease the number of white blood cells, red blood cells and platelets. You may be at increased risk for infections and bleeding. -signs of infection - fever or chills, cough, sore throat, pain or difficulty passing urine -signs of decreased platelets or bleeding - bruising, pinpoint red spots on the skin, black, tarry stools, blood in the urine -signs of decreased red blood cells - unusually weak or tired, fainting spells, lightheadedness -breathing problems -changes in vision -chest pain -mouth sores -nausea and vomiting -pain, swelling, redness at site where injected -pain, tingling, numbness in the hands or feet -redness, swelling, or sores on hands or feet -stomach pain -unusual bleeding Side effects that usually do not require medical attention (report to your doctor or health care professional if they  continue or are bothersome): -changes in finger or toe nails -diarrhea -dry or itchy skin -hair loss -headache -loss of appetite -sensitivity of eyes to the light -stomach upset -unusually teary eyes This list may not describe all possible side effects. Call your doctor for medical advice about side effects. You may report side effects to FDA at 1-800-FDA-1088. Where should I keep my medicine? This drug is given in a hospital or clinic and will not be stored at home. NOTE: This sheet is a summary. It may not cover all possible information. If you have questions about this medicine, talk to your doctor, pharmacist, or health care provider.  2018 Elsevier/Gold Standard (2007-12-14 13:53:16)

## 2017-08-23 NOTE — Addendum Note (Signed)
Encounter addended by: Greer Pickerel on: 08/23/2017 4:02 PM  Actions taken: Imaging Exam ended

## 2017-08-23 NOTE — Progress Notes (Signed)
Per Dr. Burr Medico, ok to proceed with treatment with 12/31 labs.  Discharge instructions printed and verbally reviewed. Patient and Spouse verbalized understanding. Chemo spill kit provided with education.

## 2017-08-24 LAB — CANCER ANTIGEN 19-9: CA 19-9: 246 U/mL — ABNORMAL HIGH (ref 0–35)

## 2017-08-25 ENCOUNTER — Telehealth: Payer: Self-pay | Admitting: Hematology

## 2017-08-25 ENCOUNTER — Ambulatory Visit (HOSPITAL_BASED_OUTPATIENT_CLINIC_OR_DEPARTMENT_OTHER): Payer: 59

## 2017-08-25 VITALS — BP 122/84 | HR 72 | Temp 98.2°F | Resp 16

## 2017-08-25 DIAGNOSIS — Z5189 Encounter for other specified aftercare: Secondary | ICD-10-CM

## 2017-08-25 DIAGNOSIS — C787 Secondary malignant neoplasm of liver and intrahepatic bile duct: Secondary | ICD-10-CM | POA: Diagnosis not present

## 2017-08-25 DIAGNOSIS — C259 Malignant neoplasm of pancreas, unspecified: Secondary | ICD-10-CM | POA: Diagnosis not present

## 2017-08-25 DIAGNOSIS — Z7189 Other specified counseling: Secondary | ICD-10-CM

## 2017-08-25 LAB — CULTURE, BLOOD (ROUTINE X 2)
Culture: NO GROWTH
SPECIAL REQUESTS: ADEQUATE

## 2017-08-25 MED ORDER — HEPARIN SOD (PORK) LOCK FLUSH 100 UNIT/ML IV SOLN
500.0000 [IU] | Freq: Once | INTRAVENOUS | Status: AC | PRN
  Administered 2017-08-25: 500 [IU]
  Filled 2017-08-25: qty 5

## 2017-08-25 MED ORDER — SODIUM CHLORIDE 0.9% FLUSH
3.0000 mL | INTRAVENOUS | Status: DC | PRN
  Administered 2017-08-25: 10 mL via INTRAVENOUS
  Filled 2017-08-25: qty 10

## 2017-08-25 MED ORDER — PEGFILGRASTIM INJECTION 6 MG/0.6ML ~~LOC~~
6.0000 mg | PREFILLED_SYRINGE | Freq: Once | SUBCUTANEOUS | Status: AC
  Administered 2017-08-25: 6 mg via SUBCUTANEOUS

## 2017-08-25 NOTE — Telephone Encounter (Signed)
Scheduled appt per 12/31 sch msg - left voicemail for patient.

## 2017-08-25 NOTE — Patient Instructions (Signed)
Pegfilgrastim injection What is this medicine? PEGFILGRASTIM (PEG fil gra stim) is a long-acting granulocyte colony-stimulating factor that stimulates the growth of neutrophils, a type of white blood cell important in the body's fight against infection. It is used to reduce the incidence of fever and infection in patients with certain types of cancer who are receiving chemotherapy that affects the bone marrow, and to increase survival after being exposed to high doses of radiation. This medicine may be used for other purposes; ask your health care provider or pharmacist if you have questions. COMMON BRAND NAME(S): Neulasta What should I tell my health care provider before I take this medicine? They need to know if you have any of these conditions: -kidney disease -latex allergy -ongoing radiation therapy -sickle cell disease -skin reactions to acrylic adhesives (On-Body Injector only) -an unusual or allergic reaction to pegfilgrastim, filgrastim, other medicines, foods, dyes, or preservatives -pregnant or trying to get pregnant -breast-feeding How should I use this medicine? This medicine is for injection under the skin. If you get this medicine at home, you will be taught how to prepare and give the pre-filled syringe or how to use the On-body Injector. Refer to the patient Instructions for Use for detailed instructions. Use exactly as directed. Tell your healthcare provider immediately if you suspect that the On-body Injector may not have performed as intended or if you suspect the use of the On-body Injector resulted in a missed or partial dose. It is important that you put your used needles and syringes in a special sharps container. Do not put them in a trash can. If you do not have a sharps container, call your pharmacist or healthcare provider to get one. Talk to your pediatrician regarding the use of this medicine in children. While this drug may be prescribed for selected conditions,  precautions do apply. Overdosage: If you think you have taken too much of this medicine contact a poison control center or emergency room at once. NOTE: This medicine is only for you. Do not share this medicine with others. What if I miss a dose? It is important not to miss your dose. Call your doctor or health care professional if you miss your dose. If you miss a dose due to an On-body Injector failure or leakage, a new dose should be administered as soon as possible using a single prefilled syringe for manual use. What may interact with this medicine? Interactions have not been studied. Give your health care provider a list of all the medicines, herbs, non-prescription drugs, or dietary supplements you use. Also tell them if you smoke, drink alcohol, or use illegal drugs. Some items may interact with your medicine. This list may not describe all possible interactions. Give your health care provider a list of all the medicines, herbs, non-prescription drugs, or dietary supplements you use. Also tell them if you smoke, drink alcohol, or use illegal drugs. Some items may interact with your medicine. What should I watch for while using this medicine? You may need blood work done while you are taking this medicine. If you are going to need a MRI, CT scan, or other procedure, tell your doctor that you are using this medicine (On-Body Injector only). What side effects may I notice from receiving this medicine? Side effects that you should report to your doctor or health care professional as soon as possible: -allergic reactions like skin rash, itching or hives, swelling of the face, lips, or tongue -dizziness -fever -pain, redness, or irritation at site   where injected -pinpoint red spots on the skin -red or dark-brown urine -shortness of breath or breathing problems -stomach or side pain, or pain at the shoulder -swelling -tiredness -trouble passing urine or change in the amount of urine Side  effects that usually do not require medical attention (report to your doctor or health care professional if they continue or are bothersome): -bone pain -muscle pain This list may not describe all possible side effects. Call your doctor for medical advice about side effects. You may report side effects to FDA at 1-800-FDA-1088. Where should I keep my medicine? Keep out of the reach of children. Store pre-filled syringes in a refrigerator between 2 and 8 degrees C (36 and 46 degrees F). Do not freeze. Keep in carton to protect from light. Throw away this medicine if it is left out of the refrigerator for more than 48 hours. Throw away any unused medicine after the expiration date. NOTE: This sheet is a summary. It may not cover all possible information. If you have questions about this medicine, talk to your doctor, pharmacist, or health care provider.  2018 Elsevier/Gold Standard (2016-08-06 12:58:03)  

## 2017-08-26 ENCOUNTER — Telehealth: Payer: Self-pay

## 2017-08-26 ENCOUNTER — Telehealth: Payer: Self-pay | Admitting: Hematology

## 2017-08-26 NOTE — Telephone Encounter (Signed)
Spoke to patient regarding upcoming January appointments per 1/3 sch message.

## 2017-08-26 NOTE — Telephone Encounter (Signed)
Pt called because her potassium pills are making her sick to her stomach and she is vomiting after taking the pills. Patient would like to stop taking the potassium and her diuretic and to increase her intake of potassium rich foods. This information was relayed to Dr. Burr Medico.

## 2017-08-31 ENCOUNTER — Other Ambulatory Visit: Payer: 59

## 2017-08-31 ENCOUNTER — Other Ambulatory Visit: Payer: Self-pay | Admitting: Emergency Medicine

## 2017-08-31 ENCOUNTER — Encounter: Payer: Self-pay | Admitting: Nurse Practitioner

## 2017-08-31 ENCOUNTER — Telehealth: Payer: Self-pay | Admitting: Hematology

## 2017-08-31 ENCOUNTER — Inpatient Hospital Stay (HOSPITAL_BASED_OUTPATIENT_CLINIC_OR_DEPARTMENT_OTHER): Payer: 59 | Admitting: Nurse Practitioner

## 2017-08-31 ENCOUNTER — Inpatient Hospital Stay: Payer: 59

## 2017-08-31 ENCOUNTER — Ambulatory Visit: Payer: 59 | Admitting: Hematology

## 2017-08-31 ENCOUNTER — Inpatient Hospital Stay: Payer: 59 | Attending: Hematology | Admitting: Hematology

## 2017-08-31 VITALS — BP 126/78 | HR 101 | Temp 98.1°F | Resp 17 | Ht 65.0 in | Wt 130.0 lb

## 2017-08-31 DIAGNOSIS — H8109 Meniere's disease, unspecified ear: Secondary | ICD-10-CM | POA: Insufficient documentation

## 2017-08-31 DIAGNOSIS — E876 Hypokalemia: Secondary | ICD-10-CM | POA: Diagnosis not present

## 2017-08-31 DIAGNOSIS — K5900 Constipation, unspecified: Secondary | ICD-10-CM | POA: Diagnosis not present

## 2017-08-31 DIAGNOSIS — R110 Nausea: Secondary | ICD-10-CM

## 2017-08-31 DIAGNOSIS — K1230 Oral mucositis (ulcerative), unspecified: Secondary | ICD-10-CM

## 2017-08-31 DIAGNOSIS — D72829 Elevated white blood cell count, unspecified: Secondary | ICD-10-CM | POA: Insufficient documentation

## 2017-08-31 DIAGNOSIS — R634 Abnormal weight loss: Secondary | ICD-10-CM | POA: Insufficient documentation

## 2017-08-31 DIAGNOSIS — D649 Anemia, unspecified: Secondary | ICD-10-CM | POA: Diagnosis not present

## 2017-08-31 DIAGNOSIS — C787 Secondary malignant neoplasm of liver and intrahepatic bile duct: Secondary | ICD-10-CM | POA: Insufficient documentation

## 2017-08-31 DIAGNOSIS — R21 Rash and other nonspecific skin eruption: Secondary | ICD-10-CM | POA: Insufficient documentation

## 2017-08-31 DIAGNOSIS — R197 Diarrhea, unspecified: Secondary | ICD-10-CM | POA: Diagnosis not present

## 2017-08-31 DIAGNOSIS — C259 Malignant neoplasm of pancreas, unspecified: Secondary | ICD-10-CM

## 2017-08-31 DIAGNOSIS — Z5111 Encounter for antineoplastic chemotherapy: Secondary | ICD-10-CM | POA: Insufficient documentation

## 2017-08-31 DIAGNOSIS — R630 Anorexia: Secondary | ICD-10-CM | POA: Diagnosis not present

## 2017-08-31 DIAGNOSIS — M7989 Other specified soft tissue disorders: Secondary | ICD-10-CM | POA: Diagnosis not present

## 2017-08-31 DIAGNOSIS — R740 Nonspecific elevation of levels of transaminase and lactic acid dehydrogenase [LDH]: Secondary | ICD-10-CM | POA: Insufficient documentation

## 2017-08-31 DIAGNOSIS — K1231 Oral mucositis (ulcerative) due to antineoplastic therapy: Secondary | ICD-10-CM

## 2017-08-31 DIAGNOSIS — K859 Acute pancreatitis without necrosis or infection, unspecified: Secondary | ICD-10-CM

## 2017-08-31 DIAGNOSIS — Z95828 Presence of other vascular implants and grafts: Secondary | ICD-10-CM

## 2017-08-31 DIAGNOSIS — C251 Malignant neoplasm of body of pancreas: Secondary | ICD-10-CM

## 2017-08-31 DIAGNOSIS — Z5189 Encounter for other specified aftercare: Secondary | ICD-10-CM | POA: Insufficient documentation

## 2017-08-31 DIAGNOSIS — F419 Anxiety disorder, unspecified: Secondary | ICD-10-CM | POA: Diagnosis not present

## 2017-08-31 DIAGNOSIS — Z5112 Encounter for antineoplastic immunotherapy: Secondary | ICD-10-CM | POA: Diagnosis not present

## 2017-08-31 LAB — COMPREHENSIVE METABOLIC PANEL
ALBUMIN: 3 g/dL — AB (ref 3.5–5.0)
ALT: 72 U/L — ABNORMAL HIGH (ref 0–55)
ANION GAP: 9 (ref 3–11)
AST: 90 U/L — AB (ref 5–34)
Alkaline Phosphatase: 497 U/L — ABNORMAL HIGH (ref 40–150)
BUN: 8 mg/dL (ref 7–26)
CHLORIDE: 101 mmol/L (ref 98–109)
CO2: 26 mmol/L (ref 22–29)
Calcium: 8.8 mg/dL (ref 8.4–10.4)
Creatinine, Ser: 0.98 mg/dL (ref 0.60–1.10)
GFR calc Af Amer: >60 mL/min (ref >60)
GFR calc non Af Amer: >60 mL/min (ref >60)
Glucose, Bld: 120 mg/dL (ref 70–140)
POTASSIUM: 2.9 mmol/L — AB (ref 3.3–4.7)
Sodium: 136 mmol/L (ref 136–145)
Total Bilirubin: 1 mg/dL (ref 0.2–1.2)
Total Protein: 6.7 g/dL (ref 6.4–8.3)

## 2017-08-31 LAB — CBC WITH DIFFERENTIAL/PLATELET
Abs Granulocyte: 2.7 10*3/uL (ref 1.5–6.5)
BASOS PCT: 0 %
Basophils Absolute: 0 10*3/uL (ref 0.0–0.1)
EOS PCT: 3 %
Eosinophils Absolute: 0.1 10*3/uL (ref 0.0–0.5)
HCT: 34.6 % — ABNORMAL LOW (ref 34.8–46.6)
Hemoglobin: 11.2 g/dL — ABNORMAL LOW (ref 11.6–15.9)
Lymphocytes Relative: 20 %
Lymphs Abs: 0.9 10*3/uL (ref 0.9–3.3)
MCH: 30.4 pg (ref 25.1–34.0)
MCHC: 32.4 g/dL (ref 31.5–36.0)
MCV: 93.8 fL (ref 79.5–101.0)
Monocytes Absolute: 1 10*3/uL — ABNORMAL HIGH (ref 0.1–0.9)
Monocytes Relative: 21 %
Neutro Abs: 2.7 10*3/uL (ref 1.5–6.5)
Neutrophils Relative %: 56 %
PLATELETS: 222 10*3/uL (ref 145–400)
RBC: 3.69 MIL/uL — AB (ref 3.70–5.45)
RDW: 14.6 % (ref 11.2–16.1)
WBC: 4.7 10*3/uL (ref 3.9–10.3)

## 2017-08-31 MED ORDER — HEPARIN SOD (PORK) LOCK FLUSH 100 UNIT/ML IV SOLN
500.0000 [IU] | Freq: Once | INTRAVENOUS | Status: AC
  Administered 2017-08-31: 500 [IU] via INTRAVENOUS
  Filled 2017-08-31: qty 5

## 2017-08-31 MED ORDER — MAGIC MOUTHWASH W/LIDOCAINE: 10.0000 mL | Freq: Four times a day (QID) | ORAL | 0 refills | Status: DC | PRN

## 2017-08-31 MED ORDER — MAGIC MOUTHWASH W/LIDOCAINE: 5.0000 mL | Freq: Three times a day (TID) | ORAL | 0 refills | Status: DC | PRN

## 2017-08-31 MED ORDER — HEPARIN SOD (PORK) LOCK FLUSH 100 UNIT/ML IV SOLN
500.0000 [IU] | Freq: Once | INTRAVENOUS | Status: DC
  Filled 2017-08-31: qty 5

## 2017-08-31 MED ORDER — MAGIC MOUTHWASH W/LIDOCAINE: 10.0000 mL | Freq: Three times a day (TID) | ORAL | 0 refills | Status: DC | PRN

## 2017-08-31 MED ORDER — SODIUM CHLORIDE 0.9 % IV SOLN
Freq: Once | INTRAVENOUS | Status: AC
  Administered 2017-08-31: 15:00:00 via INTRAVENOUS
  Filled 2017-08-31: qty 1000

## 2017-08-31 MED ORDER — POTASSIUM CHLORIDE ER 10 MEQ PO CPCR: 20.0000 meq | ORAL_CAPSULE | Freq: Two times a day (BID) | ORAL | 0 refills | Status: DC

## 2017-08-31 MED ORDER — SODIUM CHLORIDE 0.9% FLUSH
10.0000 mL | INTRAVENOUS | Status: DC | PRN
  Administered 2017-08-31: 10 mL via INTRAVENOUS
  Filled 2017-08-31: qty 10

## 2017-08-31 NOTE — Patient Instructions (Signed)

## 2017-08-31 NOTE — Progress Notes (Signed)
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 Cholelithiasis.  No secondary signs of acute cholecystitis. 2. Multiple solid liver masses measuring up to 6.5 cm in the left lobe of the liver, evaluation for metastatic disease is recommended. These results will be called to the ordering clinician or representative by the Radiologist Assistant, and communication documented in the PACS or zVision Dashboard.      07/23/2017 Imaging    CT Abdomen W Contrast 07/23/17 IMPRESSION: 1. Widespread metastatic disease throughout the liver. No clear primary malignancy identified in the abdomen. The pelvis was not imaged. Tissue sampling recommended. 2. Probable adenopathy superior to the pancreatic tail. No evidence of pancreatic mass. 3. Suspected incidental hemangioma inferiorly in the right hepatic lobe. 4. Nonspecific nodularity in the breasts. The patient has undergone recent (03/25/2017 and 04/01/2017) mammography and ultrasound.      07/29/2017 Initial Diagnosis    Metastasis to liver of unknown origin ([place])      07/29/2017 PET scan    PET 07/29/17  IMPRESSION: 1. Numerous bulky liver masses are hypermetabolic compatible with malignancy. 2. Accentuated activity within or along the pancreatic tail, likely represent a primary pancreatic tumor. Consider pancreatic protocol MRI to further work this up. 3. The peripancreatic lymph node shown above  the pancreatic tail is mildly hypermetabolic favoring malignancy. 4.  Prominent stool throughout the colon favors constipation. 5. 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      08/05/2017 Pathology Results    Liver Biopsy  Diagnosis 08/05/17 Liver, needle/core biopsy - CARCINOMA. - SEE COMMENT. Microscopic Comment The malignant cells are positive for cytokeratin 7. They are negative for arginase, CDX2, cytokeratin 20, estrogen receptor, GATA-3, GCDFP, Glypican 3, Hep Par 1, Napsin A, and TTF-1. This immunohistochemical is nonspecific. Possibly primary sources include pancreatobiliary and upper gastrointestinal. Radiologic correlation is necessary. Of note, organ specific markers (GATA-3, GCDFP-breast, TTF-1, Napsin A-lung, and CDX2-colon) are negative. (JBK:ecj 08/09/2017)       Chemotherapy    PENDING FOLFIRINOX every 2 weeks starting 08/21/17       CURRENT THERAPY: FOLFIRINOX every 2 weeks started 08/21/2017  INTERVAL HISTORY: Morgan Dawson returns today for follow-up as scheduled 1 week following first cycle FOLFIRINOX on 08/23/17.  She felt well informed and prepared for chemotherapy. She did not feel well on days 3 to 4 but felt slightly improved for next 2 days. On days 6-7 she developed dull aching RUQ pain requiring medication and struggled to eat and drink; she had dry mouth, decreased taste and no desire to eat although she forced herself. She has sore mouth. Cold sensitivity to fingers and hands lasted 1 week and is nearly gone; denies numbness or tingling. No bone pain with neulasta. During the week of treatment she had no more than 4 episodes of loose stool per day, did not want to take medication as she wanted to see how her body would naturally respond to chemo. Past 2 days she has been constipated, last BM  today. She will use miralax or dulcolax PRN. She notes her skin rash improved after chemotherapy.  REVIEW OF SYSTEMS:   Constitutional: Denies fevers, chills (+)  abnormal  weight loss (+) mild fatigue Eyes: Denies blurriness of vision Ears, nose, mouth, throat, and face: Denies mucositis or sore throat (+) sore mouth Respiratory: Denies cough, dyspnea or wheezes Cardiovascular: Denies palpitation, chest discomfort or lower extremity swelling Gastrointestinal:  Denies nausea, vomiting, heartburn (+) no more than 4 lose stool per day during week of chemo (+) constipation last 2 days (+) BM today (+) intermittent dull aching RUQ pain Skin: Denies abnormal skin rashes Lymphatics: Denies new lymphadenopathy or easy bruising Neurological:Denies numbness, tingling or new weaknesses Behavioral/Psych: Mood is stable, no new changes  All other systems were reviewed with the patient and are negative.  MEDICAL HISTORY:  Past Medical History:  Diagnosis Date  . Meniere disease 2016  . Pancreatitis     SURGICAL HISTORY: Past Surgical History:  Procedure Laterality Date  . CERVICAL ABLATION    . ENDOMETRIAL ABLATION  2011  . IR FLUORO GUIDE PORT INSERTION RIGHT  08/12/2017  . IR US GUIDE VASC ACCESS RIGHT  08/12/2017  . [place]    I have reviewed the social history and family history with the patient and they are unchanged from previous note.  ALLERGIES:  has No Known Allergies.  MEDICATIONS:  Current Outpatient Medications  Medication Sig Dispense Refill  . bisacodyl (DULCOLAX) 5 MG EC tablet Take 5 mg by mouth daily as needed for mild constipation or moderate constipation.    . lidocaine-prilocaine (EMLA) cream Apply to affected area once 30 g 3  . loratadine (CLARITIN) 10 MG tablet Take 10 mg by mouth daily as needed for allergies.    Marland Kitchen meclizine (ANTIVERT) 25 MG tablet Take 25 mg by mouth 3 (three) times daily as needed for dizziness.    . prochlorperazine (COMPAZINE) 10 MG tablet Take 1 tablet (10 mg total) by mouth every 6 (six) hours as needed for nausea or vomiting. 30 tablet 0  . traMADol (ULTRAM) 50 MG tablet Take 1 tablet (50 mg  total) by mouth every 6 (six) hours as needed. 30 tablet 0  . ondansetron (ZOFRAN-ODT) 8 MG disintegrating tablet Take 1 tablet (8 mg total) by mouth every 8 (eight) hours as needed for nausea or vomiting. (Patient not taking: Reported on 08/31/2017) 40 tablet 2  . polyethylene glycol (MIRALAX / GLYCOLAX) packet Take 17 g by mouth daily as needed for mild constipation.    . potassium chloride (MICRO-K) 10 MEQ CR capsule Take 2 capsules (20 mEq total) by mouth 2 (two) times daily. Take 2 capsules twice per day for 5 days then take 2 capsules daily 70 capsule 0  . triamterene-hydrochlorothiazide (DYAZIDE) 37.5-25 MG capsule Take 1 capsule by mouth daily.     Current Facility-Administered Medications  Medication Dose Route Frequency Provider Last Rate Last Dose  . sodium chloride 0.9 % 1,000 mL with potassium chloride 20 mEq infusion   Intravenous Once Cira Rue K, NP 500 mL/hr at 08/31/17 1506      PHYSICAL EXAMINATION: ECOG PERFORMANCE STATUS: 2 - Symptomatic, <50% confined to bed  Vitals:   08/31/17 1121  BP: 126/78  Pulse: (!) 101  Resp: 17  Temp: 98.1 F (36.7 C)  SpO2: 99%   Filed Weights   08/31/17 1121  Weight: 130 lb (59 kg)    GENERAL:alert, no distress and comfortable SKIN: skin color, texture, turgor are  normal, no rashes or significant lesions EYES: normal, Conjunctiva are pink and non-injected, sclera clear OROPHARYNX:no exudate, no erythema and lips, buccal mucosa are normal (+) small ulceration to left outer tongue  NECK: supple, thyroid normal size, non-tender, without nodularity LYMPH:  no palpable cervical, supraclavicular, axillary, or inguinal lymphadenopathy  LUNGS: clear to auscultation bilaterally with normal breathing effort HEART: regular rate & rhythm and no murmurs and no lower extremity edema ABDOMEN:abdomen soft, non-tender and normal bowel sounds (+) hepatomegaly palpable 3 cm below rib cage with tenderness Musculoskeletal:no cyanosis of digits and  no clubbing  NEURO: alert & oriented x 3 with fluent speech, no focal motor/sensory deficits PAC without erythema   LABORATORY DATA:  I have reviewed the data as listed CBC Latest Ref Rng & Units 08/31/2017 08/23/2017 08/20/2017  WBC 3.9 - 10.3 K/uL 4.7 8.3 12.6(H)  Hemoglobin 11.6 - 15.9 g/dL 11.2(L) 11.9 12.3  Hematocrit 34.8 - 46.6 % 34.6(L) 36.0 37.1  Platelets 145 - 400 K/uL 222 401(H) 360    CMP Latest Ref Rng & Units 08/31/2017 08/23/2017 08/20/2017  Glucose 70 - 140 mg/dL 120 116 108(H)  BUN 7 - 26 mg/dL 8 11.6 17  Creatinine 0.60 - 1.10 mg/dL 0.98 1.0 1.19(H)  Sodium 136 - 145 mmol/L 136 134(L) 128(L)  Potassium 3.3 - 4.7 mmol/L 2.9(LL) 2.5 Repeated and Verified(LL) 3.2(L)  Chloride 98 - 109 mmol/L 101 - 94(L)  CO2 22 - 29 mmol/L 26 26 26   Calcium 8.4 - 10.4 mg/dL 8.8 10.4 12.2(H)  Total Protein 6.4 - 8.3 g/dL 6.7 6.4 6.8  Total Bilirubin 0.2 - 1.2 mg/dL 1.0 0.82 2.0(H)  Alkaline Phos 40 - 150 U/L 497(H) 549(H) 455(H)  AST 5 - 34 U/L 90(H) 208(HH) 210(H)  ALT 0 - 55 U/L 72(H) 151(H) 149(H)   PATHOLOGY  Liver Biopsy  Diagnosis 08/05/17 Liver, needle/core biopsy - CARCINOMA. - SEE COMMENT. Microscopic Comment The malignant cells are positive for cytokeratin 7. They are negative for arginase, CDX2, cytokeratin 20, estrogen receptor, GATA-3, GCDFP, Glypican 3, Hep Par 1, Napsin A, and TTF-1. This immunohistochemical is nonspecific. Possibly primary sources include pancreatobiliary and upper gastrointestinal. Radiologic correlation is necessary. Of note, organ specific markers (GATA-3, GCDFP-breast, TTF-1, Napsin A-lung, and CDX2-colon) are negative. (JBK:ecj 08/09/2017)   Diagnosis 03/07/13 Surgical, cecum, polyp -TUBULAR ADENOMA -NEGATIVE FOR HIGH GRADE DYSPLASIA   PROCEDURES  Colonoscopy by Dr. Paulita Fujita 03/07/13 IMPRESSION:  -One 5 m polyp in the cecum. Resected and retrieved -The distal rectum and anal verge are normal on retroflexion view.  -The examination  was otherwise normal.     RADIOGRAPHIC STUDIES: I have personally reviewed the radiological images as listed and agreed with the findings in the report. No results found.   ASSESSMENT & PLAN: Morgan Dawson is a 57 y.o. caucasian female with a history of vertigo and menieres disease, presented with epigastric discomfort, low appetite and 5 pound weight loss   1. 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  Pancreatitis 3.  Multiple skin rash on lower legs 4.  Nausea and vomiting 5.  Anorexia, weight loss 6.  Goal of care discussion 7. Hypokalemia  8. Oral mucositis  9. Mild dehydration   Morgan Dawson appears stable today, she tolerated cycle 1 FOLFIRINOX well overall with mild GI toxicity including diarrhea alternating with constipation, decreased po intake, mucositis. She did not use any medication to treat diarrhea or constipation, we reviewed the importance of limiting diarrhea to prevent electrolyte imbalance and dehydration.  I suspect she is mildly dehydrated today, HR 101. Hypokalemia with K 2.9 today, however, she cannot tolerate large oral K tablets. I will give 1 L NS + 20 mEq K today over 2 hours. I changed oral K supplement to capsules, she will take two 10 mEq capsules twice daily x5 days then 2 capsules daily. I encouraged her to use imodium for lose or watery stool and miralax for constipation, she agrees. She takes Dyazide for Meniere's Disease but has been holding it lately due to effects on potassium. For mucositis I prescribed magic mouthwash. She will try warm salt water rinse first and use mouthwash if needed, Rx sent to pharmacy. CBC otherwise stable, mild anemia Hgb 11.2, Cmet with significant improvement in LFTs. Will continue to monitor. She will return in 1 week for f/u with Dr. Burr Medico and cycle 2 FOLFIRINOX, she will have nutrition consult that day as well.   Plan -Begin bowel regimen with imodium for diarrhea/lose stool and miralax for constipation  PRN -1 L NS + 20 mEq K IV today -Stop Kdur tablet; change to 10 mEq capsule, take 2 capsules BID x5 days then take 2 capsules daily; holding Dyazide -Warm salt water rinse and magic mouthwash PRN for mucositis -Return in 1 week for f/u with Dr. Burr Medico and cycle 2 FOLFIRINOX -Dietician f/u in 1 week during infusion  All questions were answered. The patient knows to call the clinic with any problems, questions or concerns. No barriers to learning was detected.    Alla Feeling, NP 08/31/17

## 2017-08-31 NOTE — Telephone Encounter (Signed)
Scheduled appt per 1/8 los - unable to schedule treatment for 2/11 due to another appt else where patient will come in later to day after she cancels it so I can add treatment .

## 2017-08-31 NOTE — Progress Notes (Signed)
Pt's Port-A-Cath left accessed per patients request. Pt had left band-aide on port for appox 2 weeks. Pt instructed never to leave band-aide or dressing on Port-A-Cath unless instructed to do so by health care personal.

## 2017-09-01 NOTE — Progress Notes (Signed)
This encounter was created in error - please disregard.

## 2017-09-03 NOTE — Progress Notes (Signed)
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 Cholelithiasis.  No secondary signs of acute cholecystitis. 2. Multiple solid liver masses measuring up to 6.5 cm in the left lobe of the liver, evaluation for metastatic disease is recommended. These results will be called to the ordering clinician or representative by the Radiologist Assistant, and communication documented in the PACS or zVision Dashboard.      07/23/2017 Imaging    CT Abdomen W Contrast 07/23/17 IMPRESSION: 1. Widespread metastatic disease throughout the liver. No clear primary malignancy identified in the abdomen. The pelvis was not imaged. Tissue sampling recommended. 2. Probable adenopathy superior to the pancreatic tail. No evidence of pancreatic mass. 3. Suspected incidental hemangioma inferiorly in the right hepatic lobe. 4. Nonspecific nodularity in the breasts. The patient has undergone recent (03/25/2017 and 04/01/2017) mammography and ultrasound.      07/29/2017 Initial Diagnosis    Metastasis to liver of unknown origin ([place])      07/29/2017 PET scan    PET 07/29/17  IMPRESSION: 1. Numerous bulky liver masses are hypermetabolic compatible with malignancy. 2. Accentuated activity within or along the pancreatic tail, likely represent a primary pancreatic tumor. Consider pancreatic protocol MRI to further work this  up. 3. The peripancreatic lymph node shown above the pancreatic tail is mildly hypermetabolic favoring malignancy. 4.  Prominent stool throughout the colon favors constipation. 5. 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      08/05/2017 Pathology Results    Liver Biopsy  Diagnosis 08/05/17 Liver, needle/core biopsy - CARCINOMA. - SEE COMMENT. Microscopic Comment The malignant cells are positive for cytokeratin 7. They are negative for arginase, CDX2, cytokeratin 20, estrogen receptor, GATA-3, GCDFP, Glypican 3, Hep Par 1, Napsin A, and TTF-1. This immunohistochemical is nonspecific. Possibly primary sources include pancreatobiliary and upper gastrointestinal. Radiologic correlation is necessary. Of note, organ specific markers (GATA-3, GCDFP-breast, TTF-1, Napsin A-lung, and CDX2-colon) are negative. (JBK:ecj 08/09/2017)       Chemotherapy    PENDING FOLFIRINOX every 2 weeks starting 08/21/17          HISTORY OF PRESENTING ILLNESS: 07/30/17  Morgan Dawson 57 y.o. 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 The patient was referred by her PCP Dr. [name]. The patient presents to the clinic today accompanied by husband.  Today the patient reports the weekend before Thanksgiving she had abdominal issues with bloating and cramps. These symptoms worsened after she would eat. This persisted past Thanksgiving so she went to her PCP.  She presented to her PCP on 07/20/17 for upper abdominal pain, nausea and bloating. She had lab work up and RUQ [place] on 07/23/17. Results showed elevated LFTs (alk phos 227, AST 191, ALT 275) and US showed multiple masses in her liver and pancreatitis. She was put on a bland die due to her pancreatitis. CT AP in 07/23/17 showed diffuse metastatic  disease in her liver with no clear primary. She had a PET scan that shows possible metastasis to peripancreatic lymph node along with her known liver masses.   Today she  notes she has focused soreness in RUQ. She feels nauseous and has vomited 3 times total in the past 3 weeks. She has lost weight, initially purposefully. She has lost 5-10 pounds. She has been constipated  lately. She takes dulcolax to help. She denies dark stool or GI bleeding. No hematemesis. She does feel more fatigued. She was relatively active 4-5 days a week before last month. She has not needed medication as she feels she has more discomfort (1-2/10). She has not been exercising because she is afraid of making things worse. Her appetite is low from her discomfort and nausea. She notes multiple tender knots on her lower legs that appeared 5 nights ago. There is no itching. She notes she will follow up with Dr. Paulita Fujita this month for her pancreatitis.   Her last mammogram at the [facility] was 04/01/17 and mostly benign. She was recommended to repeat in 6 months. She notes she frequently has cyst in her breast which were benign before.  Socially she is not working. She usually is active in the gym 3-4 days a week. She has 2 children (son and daughter) at 69yo and 54yo.   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 Her last episode of vertigo was a few months ago. She takes a diuretic and a low salt diet. She has jaw surgery for TMJ issues. She had endometrial ablation due to menorrhagia in 2011. She has only spotted since, no full period. Patient had a colonoscopy in 2014 with one benign polyp removed. Her father had colon cancer in his 53s. Her maternal grandfather had colon cancer in his 47s. Paternal aunt had lymphoma.     CURRENT THERAPY: FOLFIRINOX every 2 weeks starting 08/21/17    INTERVAL HISTORY Morgan Dawson is here for a follow up. She presents to the clinic today accompanied by her husband. She reports she is doing well overall after her 1st treatment of FOLFIRINOX. She states that the IV fluids and potassium treatment during her 1st cycle did help her not feel as bad after  chemo. She notes that she does have a decreased appetite that is normal for her and that she did not experience nausea. She states that since she has been taking potassium supplement, her energy level has increased. She does not have any bone pain after the nuelasta injection. Pt has a vacation planned to [facility] from 2/7-2/10 and has concerns about traveling. I advised pt to wear a mask in the airport to avoid cold/flu symptoms.  On review of systems, pt reports leg swelling that is worse in her ankles.     MEDICAL HISTORY:  Past Medical History:  Diagnosis Date  . Meniere disease 2016  . Pancreatitis     SURGICAL HISTORY: Past Surgical History:  Procedure Laterality Date  . CERVICAL ABLATION    . ENDOMETRIAL ABLATION  2011  . IR FLUORO GUIDE PORT INSERTION RIGHT  08/12/2017  . IR US GUIDE VASC ACCESS RIGHT  08/12/2017  . MANDIBLE SURGERY  1999    SOCIAL HISTORY: Social History   Socioeconomic History  . Marital status: Married    Spouse name: Not on file  . Number of children: Not on file  . Years of education: Not on file  . Highest education level: Not on file  Social  Needs  . Financial resource strain: Not on file  . Food insecurity - worry: Not on file  . Food insecurity - inability: Not on file  . Transportation needs - medical: Not on file  . Transportation needs - non-medical: Not on file  Occupational History  . Not on file  Tobacco Use  . Smoking status: Never Smoker  . Smokeless tobacco: Never Used  Substance and Sexual Activity  . Alcohol use: No    Frequency: Never  . Drug use: No  . Sexual activity: Not on file  Other Topics Concern  . Not on file  Social History Narrative  . Not on file    FAMILY HISTORY: Family History  Problem Relation Age of Onset  . Cancer Father 44       colon cancer   . Cancer Maternal Aunt        lymphoma   . Cancer Maternal Grandfather 39       colon cancer   . Cancer Cousin        breast cancer     ALLERGIES:   has No Known Allergies.  MEDICATIONS:  Current Outpatient Medications  Medication Sig Dispense Refill  . bisacodyl (DULCOLAX) 5 MG EC tablet Take 5 mg by mouth daily as needed for mild constipation or moderate constipation.    . lidocaine-prilocaine (EMLA) cream Apply to affected area once 30 g 3  . loratadine (CLARITIN) 10 MG tablet Take 10 mg by mouth daily as needed for allergies.    . magic mouthwash w/lidocaine SOLN Take 10 mLs by mouth 3 (three) times daily as needed for mouth pain. Swish and spit 10 ML by mouth 3 times daily as needed for mouth pain. 240 mL 0  . meclizine (ANTIVERT) 25 MG tablet Take 25 mg by mouth 3 (three) times daily as needed for dizziness.    . ondansetron (ZOFRAN-ODT) 8 MG disintegrating tablet Take 1 tablet (8 mg total) by mouth every 8 (eight) hours as needed for nausea or vomiting. 40 tablet 2  . polyethylene glycol (MIRALAX / GLYCOLAX) packet Take 17 g by mouth daily as needed for mild constipation.    . potassium chloride (MICRO-K) 10 MEQ CR capsule Take 2 capsules (20 mEq total) by mouth 2 (two) times daily. Take 2 capsules twice per day for 5 days then take 2 capsules daily 70 capsule 0  . prochlorperazine (COMPAZINE) 10 MG tablet Take 1 tablet (10 mg total) by mouth every 6 (six) hours as needed for nausea or vomiting. 30 tablet 0  . traMADol (ULTRAM) 50 MG tablet Take 1 tablet (50 mg total) by mouth every 6 (six) hours as needed. 30 tablet 0  . triamterene-hydrochlorothiazide (DYAZIDE) 37.5-25 MG capsule Take 1 capsule by mouth daily.     No current facility-administered medications for this visit.    Facility-Administered Medications Ordered in Other Visits  Medication Dose Route Frequency Provider Last Rate Last Dose  . fluorouracil (ADRUCIL) 4,000 mg in sodium chloride 0.9 % 70 mL chemo infusion  2,400 mg/m2 (Treatment Plan Recorded) Intravenous 1 day or 1 dose [name], MD   4,000 mg at 09/06/17 1740    REVIEW OF SYSTEMS:   Constitutional: Denies  fevers, chills or abnormal night sweats (+) fatigue  (+) low appetite (+) weight loss Eyes: Denies blurriness of vision, double vision or watery eyes Ears, nose, mouth, throat, and face: Denies mucositis or sore throat Respiratory: Denies cough, dyspnea or wheezes Cardiovascular: Denies palpitation, chest discomfort or lower  extremity swelling Gastrointestinal:  Denies nausea, vomitting Skin: Denies abnormal skin rashes (+) tender knots down lower legs bilaterally Lymphatics: Denies new lymphadenopathy or easy bruising Neurological:Denies numbness, tingling or new weaknesses Behavioral/Psych: Mood is stable, no new changes  All other systems were reviewed with the patient and are negative.  PHYSICAL EXAMINATION:  ECOG PERFORMANCE STATUS: 1 - Symptomatic but completely ambulatory  Vitals:   09/06/17 0948  BP: 128/84  Pulse: 83  Resp: 18  Temp: 98.4 F (36.9 C)  SpO2: 100%   Filed Weights   09/06/17 0948  Weight: 131 lb 12.8 oz (59.8 kg)    GENERAL:alert, no distress and comfortable SKIN: skin color, texture, turgor are normal, no rashes or significant lesions EYES: normal, conjunctiva are pink and non-injected, sclera clear OROPHARYNX:no exudate, no erythema and lips, buccal mucosa, and tongue normal  NECK: supple, thyroid normal size, non-tender, without nodularity LYMPH:  no palpable lymphadenopathy in the cervical, axillary or inguinal LUNGS: clear to auscultation and percussion with normal breathing effort HEART: regular rate & rhythm and no murmurs and no lower extremity edema ABDOMEN:abdomen soft, normal bowel sounds (+) hepatomegaly about 3cm below ribcage with tenderness on palpation  Musculoskeletal:no cyanosis of digits and no clubbing  PSYCH: alert & oriented x 3 with fluent speech NEURO: no focal motor/sensory deficits  LABORATORY DATA:  I have reviewed the data as listed CBC Latest Ref Rng & Units 09/06/2017 08/31/2017 08/23/2017  WBC 3.9 - 10.3 K/uL 18.9(H) 4.7  8.3  Hemoglobin 11.6 - 15.9 g/dL 10.7(L) 11.2(L) 11.9  Hematocrit 34.8 - 46.6 % 34.1(L) 34.6(L) 36.0  Platelets 145 - 400 K/uL 206 222 401(H)    CMP Latest Ref Rng & Units 09/06/2017 08/31/2017 08/23/2017  Glucose 70 - 140 mg/dL 97 120 116  BUN 7 - 26 mg/dL 7 8 11.6  Creatinine 0.60 - 1.10 mg/dL 1.00 0.98 1.0  Sodium 136 - 145 mmol/L 141 136 134(L)  Potassium 3.3 - 4.7 mmol/L 3.0(LL) 2.9(LL) 2.5 Repeated and Verified(LL)  Chloride 98 - 109 mmol/L 106 101 -  CO2 22 - 29 mmol/L _0 Calcium 8.4 - 10.4 mg/dL 8.5 8.8 10.4  Total Protein 6.4 - 8.3 g/dL 6.3(L) 6.7 6.4  Total Bilirubin 0.2 - 1.2 mg/dL 0.6 1.0 0.82  Alkaline Phos 40 - 150 U/L 417(H) 497(H) 549(H)  AST 5 - 34 U/L 126(H) 90(H) 208(HH)  ALT 0 - 55 U/L 65(H) 72(H) 151(H)   Her recent outside lab (07/20/2017) WBC 7.7, hemoglobin 14.4, hematocrit 43%, platelets 374K, MCV 91.8 BUN 17, creatinine 0.88, GFR 67, sodium 136, potassium 3.3, calcium 11.0, total protein 7.8, albumin 4.4, total bilirubin 0.7, ALP 227, AST 191, ALT 275,  lipase 2969   PATHOLOGY  Liver Biopsy  Diagnosis 08/05/17 Liver, needle/core biopsy - CARCINOMA. - SEE COMMENT. Microscopic Comment The malignant cells are positive for cytokeratin 7. They are negative for arginase, CDX2, cytokeratin 20, estrogen receptor, GATA-3, GCDFP, Glypican 3, Hep Par 1, Napsin A, and TTF-1. This immunohistochemical is nonspecific. Possibly primary sources include pancreatobiliary and upper gastrointestinal. Radiologic correlation is necessary. Of note, organ specific markers (GATA-3, GCDFP-breast, TTF-1, Napsin A-lung, and CDX2-colon) are negative. (JBK:ecj 08/09/2017)   Diagnosis 03/07/13 Surgical, cecum, polyp -TUBULAR ADENOMA -NEGATIVE FOR HIGH GRADE DYSPLASIA   PROCEDURES  Colonoscopy by Dr. Paulita Fujita 03/07/13 IMPRESSION:  -One 5 m polyp in the cecum. Resected and retrieved -The distal rectum and anal verge are normal on retroflexion view.  -The examination was  otherwise normal.    RADIOGRAPHIC  STUDIES: I have personally reviewed the radiological images as listed and agreed with the findings in the report. Dg Chest 2 View  Result Date: 08/20/2017 CLINICAL DATA:  Pancreatic cancer.  Rule out sepsis EXAM: CHEST  2 VIEW COMPARISON:  None. FINDINGS: Cardiac and mediastinal contours normal. Lungs are clear. Negative for pneumonia or effusion. Port-A-Cath tip SVC IMPRESSION: No active cardiopulmonary disease. Electronically Signed   By: Franchot Gallo M.D.   On: 08/20/2017 19:56   Ir US Guide Vasc Access Right  Result Date: 08/12/2017 INDICATION: 57 year old with metastatic pancreatic cancer. Port-A-Cath needed for therapy. EXAM: FLUOROSCOPIC AND ULTRASOUND GUIDED PLACEMENT OF A SUBCUTANEOUS PORT COMPARISON:  None. MEDICATIONS: Ancef 2 g; The antibiotic was administered within an appropriate time interval prior to skin puncture. ANESTHESIA/SEDATION: Versed 2.25 mg IV; Fentanyl 100 mcg IV; Moderate Sedation Time:  42 minutes The patient was continuously monitored during the procedure by the interventional radiology nurse under my direct supervision. FLUOROSCOPY TIME:  42 seconds, 1 mGy COMPLICATIONS: None immediate. PROCEDURE: The procedure, risks, benefits, and alternatives were explained to the patient. Questions regarding the procedure were encouraged and answered. The patient understands and consents to the procedure. Patient was placed supine on the interventional table. Ultrasound confirmed a patent right internal jugular vein. The right chest and neck were cleaned with a skin antiseptic and a sterile drape was placed. Maximal barrier sterile technique was utilized including caps, mask, sterile gowns, sterile gloves, sterile drape, hand hygiene and skin antiseptic. The right neck was anesthetized with 1% lidocaine. Small incision was made in the right neck with a blade. Micropuncture set was placed in the right internal jugular vein with ultrasound guidance.  The micropuncture wire was used for measurement purposes. The right chest was anesthetized with 1% lidocaine. #15 blade was used to make an incision and a subcutaneous port pocket was formed. [place] was assembled. Subcutaneous tunnel was formed with a stiff tunneling device. The port catheter was brought through the subcutaneous tunnel. The port was placed in the subcutaneous pocket. The micropuncture set was exchanged for a peel-away sheath. The catheter was placed through the peel-away sheath and the tip was at the superior cavoatrial junction. Catheter placement was confirmed with fluoroscopy. The port was accessed and flushed with heparinized saline. The port pocket was closed using two layers of absorbable sutures and Dermabond. The vein skin site was closed using a single layer of absorbable suture and Dermabond. Sterile dressings were applied. Patient tolerated the procedure well without an immediate complication. Ultrasound and fluoroscopic images were taken and saved for this procedure. IMPRESSION: Placement of a subcutaneous port device. Electronically Signed   By: Markus Daft M.D.   On: 08/12/2017 17:40   Ir Fluoro Guide Port Insertion Right  Result Date: 08/12/2017 INDICATION: 57 year old with metastatic pancreatic cancer. Port-A-Cath needed for therapy. EXAM: FLUOROSCOPIC AND ULTRASOUND GUIDED PLACEMENT OF A SUBCUTANEOUS PORT COMPARISON:  None. MEDICATIONS: Ancef 2 g; The antibiotic was administered within an appropriate time interval prior to skin puncture. ANESTHESIA/SEDATION: Versed 2.25 mg IV; Fentanyl 100 mcg IV; Moderate Sedation Time:  42 minutes The patient was continuously monitored during the procedure by the interventional radiology nurse under my direct supervision. FLUOROSCOPY TIME:  42 seconds, 1 mGy COMPLICATIONS: None immediate. PROCEDURE: The procedure, risks, benefits, and alternatives were explained to the patient. Questions regarding the procedure were encouraged and  answered. The patient understands and consents to the procedure. Patient was placed supine on the interventional table. Ultrasound confirmed a patent right  internal jugular vein. The right chest and neck were cleaned with a skin antiseptic and a sterile drape was placed. Maximal barrier sterile technique was utilized including caps, mask, sterile gowns, sterile gloves, sterile drape, hand hygiene and skin antiseptic. The right neck was anesthetized with 1% lidocaine. Small incision was made in the right neck with a blade. Micropuncture set was placed in the right internal jugular vein with ultrasound guidance. The micropuncture wire was used for measurement purposes. The right chest was anesthetized with 1% lidocaine. #15 blade was used to make an incision and a subcutaneous port pocket was formed. [place] was assembled. Subcutaneous tunnel was formed with a stiff tunneling device. The port catheter was brought through the subcutaneous tunnel. The port was placed in the subcutaneous pocket. The micropuncture set was exchanged for a peel-away sheath. The catheter was placed through the peel-away sheath and the tip was at the superior cavoatrial junction. Catheter placement was confirmed with fluoroscopy. The port was accessed and flushed with heparinized saline. The port pocket was closed using two layers of absorbable sutures and Dermabond. The vein skin site was closed using a single layer of absorbable suture and Dermabond. Sterile dressings were applied. Patient tolerated the procedure well without an immediate complication. Ultrasound and fluoroscopic images were taken and saved for this procedure. IMPRESSION: Placement of a subcutaneous port device. Electronically Signed   By: Markus Daft M.D.   On: 08/12/2017 17:40     ASSESSMENT & PLAN:  Morgan Dawson is a 57 y.o. caucasian female with a history of vertigo and menieres disease, presented with epigastric discomfort, low appetite and 5 pound  weight loss   1. 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  07/29/17 PET scan shows diffuse metastatic disease in her enlarged liver.  This is most consistent with diffuse liver metastasis.  -Her liver biopsy confirmed metastatic adenocarcinoma, most consistent with biliary or pancreatic primary this was discussed with patient.  -After further reviewing her 07/29/17 PET in the GI Tumor Board, the initial uptake in the duodenum is felt to be in the pancrease tail, which represents the primary tumor.  The consensus from GI tumor board is this is most consistent with metastatic pancreatic adenocarcinoma to liver.  We felt she does not need further imaging or EUS to confirm the primary site.  -Given her metastatic stage IV disease, her metastatic cancer is unfortunately not curable, and the goal of therapy is palliative, to prolong her life and improve her quality of life.  -Goal of therapy is to control the disease.  -I will set up genetic referral to see if she has BRCA mutation. If positive then she would be a  candidate for PARP inhibitor  -I will request her liver biopsy to be tested for FO to see if she has MSI disease, or targetable mutations. -I discussed the systemic chemotherapy options, which includes but not limited to single agent gemcitabine, gemcitabine and Abraxane, and FOLFIRINOX.  Given her young age and overall good health  -she has started first line FOLFIRINOX on 08/21/17, tolerated first cycle well overall  -Labs today (09/06/17) reveals an elevated WBC due to the Nuelasta injection, Hdg is 10.7, which is expected after starting chemo. Potassium is 3 today. I recommended that she take her potassium supplement 3 times daily instead of 2 and eat more potassium rich foods such as  nuts and bananas. Kidney function is WNL. Calcium is 8.5, on the low side of the scale. I  recommend that she eat more dairy products.  Her liver function has overall improved since she started chemo. -Pt has meniere's disease and she is concerned about her medications interfering with her potassium level. I will call Dr. Wilburn Cornelia at [facility] to discuss what medications she can take -She will receive full dose of chemo today and an IV bolus with 75mq potassium for 1 hour  -She has some leg swelling today and I advised her to watch this closely and to call if symptoms become worse  -Repeat CT scan after cycle 5. Will check CA 19.9 every month -her significant leukocytosis is likely related to Neulasta -F/u in 4 weeks   2. Pancreatitis -Found by 07/23/17 CT scan, likely related to her cancer.  -On bland diet -I previously suggested Prilosec and to continue small meals with bland diet. If needed I can give pain medication. -Abdominal pain is mild, no need for pain medication at this time. She is currently taking tylenol up to TID.    3. Nausea and Vomiting  -She has had nausea since her cancer diagnosis, she knows to use Zofran and Compazine alternatively for nausea as needed off chemo -She had severe nausea and vomiting towards the end of the chemo infusion last cycle ended today, I plan to give her IV Emend from next cycle.  4. Anorexia, weight loss -She has anxiety about eating due to emesis.  -I previously encouraged her to eat more small meals with bland or liquid diet. She can increase her intake of ensure boost -Follow-up with dietitian -weight stable lately   5. Hypercalcemia -Secondary to her underlying malignancy -Received Zometa and IV hydration in December 2019, hypercalcemia has resolved now. -Okay to restart dairy products, she is off calcium supplement.  6.  Transaminitis -Due to her underlying liver metastasis -Improved since she started chemo -Monitoring  7. Goal of care discussion  -We previously discussed the incurable nature of her cancer, and  the overall poor prognosis, especially if she does not have good response to chemotherapy or progress on chemo -The patient understands the goal of care is palliative. -I previously recommended DNR/DNI, she will think about it   PLAN:  Lab, flush, and full dose chemo FOLFIRINOX today and every 2 weeks  Continue neulasta with this cycle due to increased chemo dose Lab, flush and 2hr IVF in one week Nausea and vomiting towards the end of chemo infusion today, I will add IV Emend from next cycle F/u in 2 weeks    No orders of the defined types were placed in this encounter.   All questions were answered. The patient knows to call the clinic with any problems, questions or concerns. I spent 25 minutes counseling the patient face to face. The total time spent in the appointment was 30 minutes and more than 50% was on counseling.     YTruitt Merle MD 09/06/2017 6:34 PM   This document serves as a record of services personally performed by YTruitt Merle MD. It was created on her behalf by DTheresia Bough a trained medical scribe. The creation of this record is based on the scribe's personal observations and the provider's statements to them.   I have reviewed the above documentation for accuracy and completeness, and I agree with the above.

## 2017-09-06 ENCOUNTER — Encounter: Payer: Self-pay | Admitting: Hematology

## 2017-09-06 ENCOUNTER — Inpatient Hospital Stay: Payer: 59

## 2017-09-06 ENCOUNTER — Encounter: Payer: Self-pay | Admitting: General Practice

## 2017-09-06 ENCOUNTER — Inpatient Hospital Stay (HOSPITAL_BASED_OUTPATIENT_CLINIC_OR_DEPARTMENT_OTHER): Payer: 59 | Admitting: Hematology

## 2017-09-06 ENCOUNTER — Inpatient Hospital Stay: Payer: 59 | Admitting: Nutrition

## 2017-09-06 ENCOUNTER — Telehealth: Payer: Self-pay | Admitting: Hematology

## 2017-09-06 VITALS — BP 128/84 | HR 83 | Temp 98.4°F | Resp 18 | Ht 65.0 in | Wt 131.8 lb

## 2017-09-06 VITALS — BP 126/67 | HR 73 | Temp 98.2°F | Resp 18

## 2017-09-06 DIAGNOSIS — R630 Anorexia: Secondary | ICD-10-CM | POA: Diagnosis not present

## 2017-09-06 DIAGNOSIS — K861 Other chronic pancreatitis: Secondary | ICD-10-CM | POA: Diagnosis not present

## 2017-09-06 DIAGNOSIS — E876 Hypokalemia: Secondary | ICD-10-CM

## 2017-09-06 DIAGNOSIS — Z5111 Encounter for antineoplastic chemotherapy: Secondary | ICD-10-CM | POA: Diagnosis not present

## 2017-09-06 DIAGNOSIS — R740 Nonspecific elevation of levels of transaminase and lactic acid dehydrogenase [LDH]: Secondary | ICD-10-CM | POA: Diagnosis not present

## 2017-09-06 DIAGNOSIS — Z7189 Other specified counseling: Secondary | ICD-10-CM

## 2017-09-06 DIAGNOSIS — R112 Nausea with vomiting, unspecified: Secondary | ICD-10-CM | POA: Diagnosis not present

## 2017-09-06 DIAGNOSIS — C251 Malignant neoplasm of body of pancreas: Secondary | ICD-10-CM

## 2017-09-06 DIAGNOSIS — C259 Malignant neoplasm of pancreas, unspecified: Secondary | ICD-10-CM | POA: Diagnosis not present

## 2017-09-06 DIAGNOSIS — Z95828 Presence of other vascular implants and grafts: Secondary | ICD-10-CM

## 2017-09-06 DIAGNOSIS — C787 Secondary malignant neoplasm of liver and intrahepatic bile duct: Secondary | ICD-10-CM | POA: Diagnosis not present

## 2017-09-06 LAB — COMPREHENSIVE METABOLIC PANEL
ALK PHOS: 417 U/L — AB (ref 40–150)
ALT: 65 U/L — AB (ref 0–55)
ANION GAP: 9 (ref 3–11)
AST: 126 U/L — ABNORMAL HIGH (ref 5–34)
Albumin: 2.9 g/dL — ABNORMAL LOW (ref 3.5–5.0)
BUN: 7 mg/dL (ref 7–26)
CALCIUM: 8.5 mg/dL (ref 8.4–10.4)
CHLORIDE: 106 mmol/L (ref 98–109)
CO2: 26 mmol/L (ref 22–29)
CREATININE: 1 mg/dL (ref 0.60–1.10)
Glucose, Bld: 97 mg/dL (ref 70–140)
Potassium: 3 mmol/L — CL (ref 3.3–4.7)
SODIUM: 141 mmol/L (ref 136–145)
Total Bilirubin: 0.6 mg/dL (ref 0.2–1.2)
Total Protein: 6.3 g/dL — ABNORMAL LOW (ref 6.4–8.3)

## 2017-09-06 LAB — CBC WITH DIFFERENTIAL/PLATELET
Basophils Absolute: 0.1 10*3/uL (ref 0.0–0.1)
Basophils Relative: 0 %
EOS ABS: 0.1 10*3/uL (ref 0.0–0.5)
EOS PCT: 1 %
HCT: 34.1 % — ABNORMAL LOW (ref 34.8–46.6)
Hemoglobin: 10.7 g/dL — ABNORMAL LOW (ref 11.6–15.9)
LYMPHS ABS: 1.9 10*3/uL (ref 0.9–3.3)
LYMPHS PCT: 10 %
MCH: 29.9 pg (ref 25.1–34.0)
MCHC: 31.4 g/dL — AB (ref 31.5–36.0)
MCV: 95.3 fL (ref 79.5–101.0)
MONOS PCT: 9 %
Monocytes Absolute: 1.7 10*3/uL — ABNORMAL HIGH (ref 0.1–0.9)
Neutro Abs: 15.2 10*3/uL — ABNORMAL HIGH (ref 1.5–6.5)
Neutrophils Relative %: 80 %
PLATELETS: 206 10*3/uL (ref 145–400)
RBC: 3.58 MIL/uL — AB (ref 3.70–5.45)
RDW: 16.8 % — ABNORMAL HIGH (ref 11.2–16.1)
WBC: 18.9 10*3/uL — ABNORMAL HIGH (ref 3.9–10.3)

## 2017-09-06 MED ORDER — ATROPINE SULFATE 1 MG/ML IJ SOLN
0.5000 mg | Freq: Once | INTRAMUSCULAR | Status: AC | PRN
  Administered 2017-09-06: 0.5 mg via INTRAVENOUS

## 2017-09-06 MED ORDER — METHYLPREDNISOLONE SODIUM SUCC 125 MG IJ SOLR
INTRAMUSCULAR | Status: AC
  Filled 2017-09-06: qty 2

## 2017-09-06 MED ORDER — DEXTROSE 5 % IV SOLN
Freq: Once | INTRAVENOUS | Status: AC
  Administered 2017-09-06: 13:00:00 via INTRAVENOUS

## 2017-09-06 MED ORDER — DEXAMETHASONE SODIUM PHOSPHATE 10 MG/ML IJ SOLN
INTRAMUSCULAR | Status: AC
  Filled 2017-09-06: qty 1

## 2017-09-06 MED ORDER — DEXAMETHASONE SODIUM PHOSPHATE 10 MG/ML IJ SOLN
10.0000 mg | Freq: Once | INTRAMUSCULAR | Status: AC
  Administered 2017-09-06: 10 mg via INTRAVENOUS

## 2017-09-06 MED ORDER — POTASSIUM CHLORIDE 10 MEQ/100ML IV SOLN
10.0000 meq | Freq: Once | INTRAVENOUS | Status: AC
  Administered 2017-09-06: 10 meq via INTRAVENOUS
  Filled 2017-09-06: qty 100

## 2017-09-06 MED ORDER — SODIUM CHLORIDE 0.9% FLUSH
10.0000 mL | INTRAVENOUS | Status: DC | PRN
  Administered 2017-09-06: 10 mL via INTRAVENOUS
  Filled 2017-09-06: qty 10

## 2017-09-06 MED ORDER — FLUOROURACIL CHEMO INJECTION 5 GM/100ML
2400.0000 mg/m2 | INTRAVENOUS | Status: DC
  Administered 2017-09-06: 4000 mg via INTRAVENOUS
  Filled 2017-09-06: qty 80

## 2017-09-06 MED ORDER — DEXTROSE 5 % IV SOLN
85.0000 mg/m2 | Freq: Once | INTRAVENOUS | Status: AC
  Administered 2017-09-06: 140 mg via INTRAVENOUS
  Filled 2017-09-06: qty 20

## 2017-09-06 MED ORDER — PALONOSETRON HCL INJECTION 0.25 MG/5ML
INTRAVENOUS | Status: AC
  Filled 2017-09-06: qty 5

## 2017-09-06 MED ORDER — IRINOTECAN HCL CHEMO INJECTION 100 MG/5ML
180.0000 mg/m2 | Freq: Once | INTRAVENOUS | Status: AC
  Administered 2017-09-06: 300 mg via INTRAVENOUS
  Filled 2017-09-06: qty 15

## 2017-09-06 MED ORDER — PROCHLORPERAZINE EDISYLATE 5 MG/ML IJ SOLN
10.0000 mg | Freq: Once | INTRAMUSCULAR | Status: AC
  Administered 2017-09-06: 10 mg via INTRAVENOUS

## 2017-09-06 MED ORDER — PROCHLORPERAZINE EDISYLATE 5 MG/ML IJ SOLN
INTRAMUSCULAR | Status: AC
  Filled 2017-09-06: qty 2

## 2017-09-06 MED ORDER — PALONOSETRON HCL INJECTION 0.25 MG/5ML
0.2500 mg | Freq: Once | INTRAVENOUS | Status: AC
  Administered 2017-09-06: 0.25 mg via INTRAVENOUS

## 2017-09-06 MED ORDER — ATROPINE SULFATE 1 MG/ML IJ SOLN
INTRAMUSCULAR | Status: AC
  Filled 2017-09-06: qty 1

## 2017-09-06 MED ORDER — DEXTROSE 5 % IV SOLN
400.0000 mg/m2 | Freq: Once | INTRAVENOUS | Status: AC
  Administered 2017-09-06: 668 mg via INTRAVENOUS
  Filled 2017-09-06: qty 33.4

## 2017-09-06 MED ORDER — METHYLPREDNISOLONE SODIUM SUCC 125 MG IJ SOLR
60.0000 mg | Freq: Once | INTRAMUSCULAR | Status: AC
Start: 2017-09-06 — End: 2017-09-06
  Administered 2017-09-06: 60 mg via INTRAVENOUS

## 2017-09-06 NOTE — Progress Notes (Signed)
OK to treat despite elevated AST per Dr Feng.  

## 2017-09-06 NOTE — Progress Notes (Signed)
Nutrition follow-up completed with patient receiving treatment for metastatic pancreas cancer. Weight improved documented as 131.8 pounds January 14 increased from 132 pounds. Patient reports constipation has improved. Reports information provided to her on last visit was very helpful. She plans on adding Carnation breakfast daily.  Nutrition diagnosis: Food and nutrition related knowledge deficit improved.  Intervention: Patient educated to continue high-calorie high-protein foods as tolerated to minimize weight loss. Encouraged bowel regimen. Recommended Carnation breakfast essentials 1-2 times daily. Questions answered.  Teach back method used.  Monitoring, evaluation, goals: Patient will tolerate increased calories and protein to minimize weight loss.  Next visit: Tuesday, January 29.   **Disclaimer: This note was dictated with voice recognition software. Similar sounding words can inadvertently be transcribed and this note may contain transcription errors which may not have been corrected upon publication of note.**

## 2017-09-06 NOTE — Progress Notes (Signed)
At approx 1650 patient began vomitting. Dr. Burr Medico notified and at chairside. Orders for compazine and solumedrol IV given and carried out. 1718- Patient vomitting has resolved.

## 2017-09-06 NOTE — Telephone Encounter (Signed)
Gave avs and calendar for January and february °

## 2017-09-06 NOTE — Patient Instructions (Signed)
Implanted Port Home Guide An implanted port is a type of central line that is placed under the skin. Central lines are used to provide IV access when treatment or nutrition needs to be given through a person's veins. Implanted ports are used for long-term IV access. An implanted port may be placed because:  You need IV medicine that would be irritating to the small veins in your hands or arms.  You need long-term IV medicines, such as antibiotics.  You need IV nutrition for a long period.  You need frequent blood draws for lab tests.  You need dialysis.  Implanted ports are usually placed in the chest area, but they can also be placed in the upper arm, the abdomen, or the leg. An implanted port has two main parts:  Reservoir. The reservoir is round and will appear as a small, raised area under your skin. The reservoir is the part where a needle is inserted to give medicines or draw blood.  Catheter. The catheter is a thin, flexible tube that extends from the reservoir. The catheter is placed into a large vein. Medicine that is inserted into the reservoir goes into the catheter and then into the vein.  How will I care for my incision site? Do not get the incision site wet. Bathe or shower as directed by your health care provider. How is my port accessed? Special steps must be taken to access the port:  Before the port is accessed, a numbing cream can be placed on the skin. This helps numb the skin over the port site.  Your health care provider uses a sterile technique to access the port. ? Your health care provider must put on a mask and sterile gloves. ? The skin over your port is cleaned carefully with an antiseptic and allowed to dry. ? The port is gently pinched between sterile gloves, and a needle is inserted into the port.  Only "non-coring" port needles should be used to access the port. Once the port is accessed, a blood return should be checked. This helps ensure that the port  is in the vein and is not clogged.  If your port needs to remain accessed for a constant infusion, a clear (transparent) bandage will be placed over the needle site. The bandage and needle will need to be changed every week, or as directed by your health care provider.  Keep the bandage covering the needle clean and dry. Do not get it wet. Follow your health care provider's instructions on how to take a shower or bath while the port is accessed.  If your port does not need to stay accessed, no bandage is needed over the port.  What is flushing? Flushing helps keep the port from getting clogged. Follow your health care provider's instructions on how and when to flush the port. Ports are usually flushed with saline solution or a medicine called heparin. The need for flushing will depend on how the port is used.  If the port is used for intermittent medicines or blood draws, the port will need to be flushed: ? After medicines have been given. ? After blood has been drawn. ? As part of routine maintenance.  If a constant infusion is running, the port may not need to be flushed.  How long will my port stay implanted? The port can stay in for as long as your health care provider thinks it is needed. When it is time for the port to come out, surgery will be   done to remove it. The procedure is similar to the one performed when the port was put in. When should I seek immediate medical care? When you have an implanted port, you should seek immediate medical care if:  You notice a bad smell coming from the incision site.  You have swelling, redness, or drainage at the incision site.  You have more swelling or pain at the port site or the surrounding area.  You have a fever that is not controlled with medicine.  This information is not intended to replace advice given to you by your health care provider. Make sure you discuss any questions you have with your health care provider. Document  Released: 08/10/2005 Document Revised: 01/16/2016 Document Reviewed: 04/17/2013 Elsevier Interactive Patient Education  2017 Elsevier Inc.  

## 2017-09-06 NOTE — Patient Instructions (Signed)
[facility] Discharge Instructions for Patients Receiving Chemotherapy  Today you received the following chemotherapy agents: Oxaliplatin (Eloxatin), Leucovorin, Ironotecan (Camptosar), and Fluoruracil (Adrucil, 5-FU).  To help prevent nausea and vomiting after your treatment, we encourage you to take your nausea medication as prescribed. Received Aloxi during treatment-->take Compazine (not Zofran) for the next 3 days.   If you develop nausea and vomiting that is not controlled by your nausea medication, call the clinic.   BELOW ARE SYMPTOMS THAT SHOULD BE REPORTED IMMEDIATELY:  *FEVER GREATER THAN 100.5 F  *CHILLS WITH OR WITHOUT FEVER  NAUSEA AND VOMITING THAT IS NOT CONTROLLED WITH YOUR NAUSEA MEDICATION  *UNUSUAL SHORTNESS OF BREATH  *UNUSUAL BRUISING OR BLEEDING  TENDERNESS IN MOUTH AND THROAT WITH OR WITHOUT PRESENCE OF ULCERS  *URINARY PROBLEMS  *BOWEL PROBLEMS  UNUSUAL RASH Items with * indicate a potential emergency and should be followed up as soon as possible.  Feel free to call the clinic should you have any questions or concerns. The clinic phone number is [phone].  Please show the [place] at check-in to the Emergency Department and triage nurse.

## 2017-09-07 ENCOUNTER — Telehealth: Payer: Self-pay | Admitting: *Deleted

## 2017-09-07 ENCOUNTER — Telehealth: Payer: Self-pay | Admitting: Hematology

## 2017-09-07 NOTE — Telephone Encounter (Addendum)
Left message via vm to call back to discuss changing triamterene/HCTZ to plain triamterene.  OK per Dr Wilburn Cornelia.  Talked with pt this pm & she is OK to start triamterene only.  Pt normally gets at [facility] just had refilled but she is OK to call to [facility].  Per our computer, Triamterene only comes in 50 & 100 mg.  [facility] said they could only get combo dose.  Message to Dr Burr Medico to see what she recommends.

## 2017-09-07 NOTE — Telephone Encounter (Signed)
Spoke to patient regarding upcoming January appointment updates per 1/14 sch message.

## 2017-09-07 NOTE — Telephone Encounter (Signed)
If her local pharmacy does not have triamterene single agent, OK to change to spironolactone 25mg  daily.   [name] MD

## 2017-09-07 NOTE — Progress Notes (Signed)
[facility] Spiritual Care Note  Morgan Dawson was in good spirits on this f/u visit in infusion, citing wonderful support from family, friends, church, and neighbors. So far, she reports feeling well overall and is allowing her support and gratitude to buoy her step by step, day by day. She and her husband Morgan Dawson are aware of ongoing Spiritual Care availability, but please also page if needs arise or circumstances change. Thank you.   [place], [place], [facility] Pager [phone] Voicemail [phone]

## 2017-09-08 ENCOUNTER — Inpatient Hospital Stay (HOSPITAL_BASED_OUTPATIENT_CLINIC_OR_DEPARTMENT_OTHER): Payer: 59

## 2017-09-08 VITALS — BP 137/78 | HR 78 | Temp 98.3°F | Resp 18

## 2017-09-08 DIAGNOSIS — C787 Secondary malignant neoplasm of liver and intrahepatic bile duct: Secondary | ICD-10-CM

## 2017-09-08 DIAGNOSIS — Z7189 Other specified counseling: Secondary | ICD-10-CM

## 2017-09-08 DIAGNOSIS — C259 Malignant neoplasm of pancreas, unspecified: Secondary | ICD-10-CM

## 2017-09-08 DIAGNOSIS — Z5111 Encounter for antineoplastic chemotherapy: Secondary | ICD-10-CM | POA: Diagnosis not present

## 2017-09-08 MED ORDER — HEPARIN SOD (PORK) LOCK FLUSH 100 UNIT/ML IV SOLN
500.0000 [IU] | Freq: Once | INTRAVENOUS | Status: AC | PRN
  Administered 2017-09-08: 500 [IU]
  Filled 2017-09-08: qty 5

## 2017-09-08 MED ORDER — PEGFILGRASTIM INJECTION 6 MG/0.6ML ~~LOC~~
6.0000 mg | PREFILLED_SYRINGE | Freq: Once | SUBCUTANEOUS | Status: AC
  Administered 2017-09-08: 6 mg via SUBCUTANEOUS

## 2017-09-08 MED ORDER — SODIUM CHLORIDE 0.9% FLUSH
10.0000 mL | INTRAVENOUS | Status: DC | PRN
  Administered 2017-09-08: 10 mL
  Filled 2017-09-08: qty 10

## 2017-09-08 MED ORDER — PEGFILGRASTIM INJECTION 6 MG/0.6ML ~~LOC~~
PREFILLED_SYRINGE | SUBCUTANEOUS | Status: AC
  Filled 2017-09-08: qty 0.6

## 2017-09-10 ENCOUNTER — Telehealth: Payer: Self-pay

## 2017-09-10 ENCOUNTER — Encounter: Payer: Self-pay | Admitting: Hematology

## 2017-09-10 ENCOUNTER — Telehealth: Payer: Self-pay | Admitting: *Deleted

## 2017-09-10 ENCOUNTER — Other Ambulatory Visit: Payer: Self-pay | Admitting: *Deleted

## 2017-09-10 MED ORDER — SPIRONOLACTONE 25 MG PO TABS: 25.0000 mg | ORAL_TABLET | Freq: Every day | ORAL | 1 refills | Status: DC

## 2017-09-10 NOTE — Telephone Encounter (Signed)
Talked with pt & informed that spironolactone script was e-scribed to her local pharmacy.

## 2017-09-10 NOTE — Progress Notes (Signed)
Received PA determination from [facility] regarding Spironolactone tablets:  It states cancelled due to maximum number of allowed retail fills has been exceeded. Called [facility]([place]) who states a new RX for 90 days needs to be submitted and then it should be approved. Leaving this determination and note on physician's desk.

## 2017-09-10 NOTE — Telephone Encounter (Signed)
  Patient called to clarify appointments for 09/13/17. Patient has lab and IV fluids scheduled for 09/13/17.      )

## 2017-09-10 NOTE — Progress Notes (Signed)
Received PA request for Spironolactone.  Submitted request via Cover My Meds and contacted Myrtle RN for answers to clinical questions:  Loetta Connelley (Key: [id number])   [facility] is reviewing your PA request. Typically an electronic response will be received within 72 hours. To check for an update later, open this request from your dashboard. You may close this dialog and return to your dashboard to perform other tasks.

## 2017-09-13 ENCOUNTER — Inpatient Hospital Stay: Payer: 59

## 2017-09-13 ENCOUNTER — Other Ambulatory Visit: Payer: 59

## 2017-09-13 VITALS — BP 124/74 | HR 85 | Temp 98.0°F | Resp 16

## 2017-09-13 DIAGNOSIS — C251 Malignant neoplasm of body of pancreas: Secondary | ICD-10-CM

## 2017-09-13 DIAGNOSIS — E860 Dehydration: Secondary | ICD-10-CM

## 2017-09-13 DIAGNOSIS — Z5111 Encounter for antineoplastic chemotherapy: Secondary | ICD-10-CM | POA: Diagnosis not present

## 2017-09-13 DIAGNOSIS — Z95828 Presence of other vascular implants and grafts: Secondary | ICD-10-CM

## 2017-09-13 LAB — CBC WITH DIFFERENTIAL/PLATELET
Basophils Absolute: 0 10*3/uL (ref 0.0–0.1)
Basophils Relative: 0 %
Eosinophils Absolute: 0 10*3/uL (ref 0.0–0.5)
Eosinophils Relative: 0 %
HEMATOCRIT: 31.2 % — AB (ref 34.8–46.6)
HEMOGLOBIN: 10.4 g/dL — AB (ref 11.6–15.9)
LYMPHS ABS: 0.9 10*3/uL (ref 0.9–3.3)
LYMPHS PCT: 9 %
MCH: 30.5 pg (ref 25.1–34.0)
MCHC: 33.3 g/dL (ref 31.5–36.0)
MCV: 91.7 fL (ref 79.5–101.0)
MONOS PCT: 9 %
Monocytes Absolute: 0.9 10*3/uL (ref 0.1–0.9)
NEUTROS ABS: 8.2 10*3/uL — AB (ref 1.5–6.5)
NEUTROS PCT: 82 %
Platelets: 223 10*3/uL (ref 145–400)
RBC: 3.4 MIL/uL — ABNORMAL LOW (ref 3.70–5.45)
RDW: 15.6 % (ref 11.2–16.1)
WBC: 10.1 10*3/uL (ref 3.9–10.3)

## 2017-09-13 LAB — COMPREHENSIVE METABOLIC PANEL
ALT: 43 U/L (ref 0–55)
ANION GAP: 8 (ref 3–11)
AST: 87 U/L — ABNORMAL HIGH (ref 5–34)
Albumin: 2.6 g/dL — ABNORMAL LOW (ref 3.5–5.0)
Alkaline Phosphatase: 369 U/L — ABNORMAL HIGH (ref 40–150)
BUN: 9 mg/dL (ref 7–26)
CHLORIDE: 109 mmol/L (ref 98–109)
CO2: 23 mmol/L (ref 22–29)
Calcium: 7.6 mg/dL — ABNORMAL LOW (ref 8.4–10.4)
Creatinine, Ser: 1.09 mg/dL (ref 0.60–1.10)
GFR calc non Af Amer: 56 mL/min — ABNORMAL LOW (ref >60)
Glucose, Bld: 99 mg/dL (ref 70–140)
POTASSIUM: 3.1 mmol/L — AB (ref 3.3–4.7)
SODIUM: 140 mmol/L (ref 136–145)
Total Bilirubin: 0.5 mg/dL (ref 0.2–1.2)
Total Protein: 5.9 g/dL — ABNORMAL LOW (ref 6.4–8.3)

## 2017-09-13 MED ORDER — SODIUM CHLORIDE 0.9 % IV SOLN
INTRAVENOUS | Status: AC
  Administered 2017-09-13: 09:00:00 via INTRAVENOUS

## 2017-09-13 MED ORDER — HEPARIN SOD (PORK) LOCK FLUSH 100 UNIT/ML IV SOLN
500.0000 [IU] | Freq: Once | INTRAVENOUS | Status: AC
  Administered 2017-09-13: 500 [IU] via INTRAVENOUS
  Filled 2017-09-13: qty 5

## 2017-09-13 MED ORDER — SODIUM CHLORIDE 0.9% FLUSH
10.0000 mL | INTRAVENOUS | Status: DC | PRN
  Administered 2017-09-13: 10 mL via INTRAVENOUS
  Filled 2017-09-13: qty 10

## 2017-09-13 NOTE — Patient Instructions (Signed)
Dehydration, Adult Dehydration is a condition in which there is not enough fluid or water in the body. This happens when you lose more fluids than you take in. Important organs, such as the kidneys, brain, and heart, cannot function without a proper amount of fluids. Any loss of fluids from the body can lead to dehydration. Dehydration can range from mild to severe. This condition should be treated right away to prevent it from becoming severe. What are the causes? This condition may be caused by:  Vomiting.  Diarrhea.  Excessive sweating, such as from heat exposure or exercise.  Not drinking enough fluid, especially: ? When ill. ? While doing activity that requires a lot of energy.  Excessive urination.  Fever.  Infection.  Certain medicines, such as medicines that cause the body to lose excess fluid (diuretics).  Inability to access safe drinking water.  Reduced physical ability to get adequate water and food.  What increases the risk? This condition is more likely to develop in people:  Who have a poorly controlled long-term (chronic) illness, such as diabetes, heart disease, or kidney disease.  Who are age 65 or older.  Who are disabled.  Who live in a place with high altitude.  Who play endurance sports.  What are the signs or symptoms? Symptoms of mild dehydration may include:  Thirst.  Dry lips.  Slightly dry mouth.  Dry, warm skin.  Dizziness. Symptoms of moderate dehydration may include:  Very dry mouth.  Muscle cramps.  Dark urine. Urine may be the color of tea.  Decreased urine production.  Decreased tear production.  Heartbeat that is irregular or faster than normal (palpitations).  Headache.  Light-headedness, especially when you stand up from a sitting position.  Fainting (syncope). Symptoms of severe dehydration may include:  Changes in skin, such as: ? Cold and clammy skin. ? Blotchy (mottled) or pale skin. ? Skin that does  not quickly return to normal after being lightly pinched and released (poor skin turgor).  Changes in body fluids, such as: ? Extreme thirst. ? No tear production. ? Inability to sweat when body temperature is high, such as in hot weather. ? Very little urine production.  Changes in vital signs, such as: ? Weak pulse. ? Pulse that is more than 100 beats a minute when sitting still. ? Rapid breathing. ? Low blood pressure.  Other changes, such as: ? Sunken eyes. ? Cold hands and feet. ? Confusion. ? Lack of energy (lethargy). ? Difficulty waking up from sleep. ? Short-term weight loss. ? Unconsciousness. How is this diagnosed? This condition is diagnosed based on your symptoms and a physical exam. Blood and urine tests may be done to help confirm the diagnosis. How is this treated? Treatment for this condition depends on the severity. Mild or moderate dehydration can often be treated at home. Treatment should be started right away. Do not wait until dehydration becomes severe. Severe dehydration is an emergency and it needs to be treated in a hospital. Treatment for mild dehydration may include:  Drinking more fluids.  Replacing salts and minerals in your blood (electrolytes) that you may have lost. Treatment for moderate dehydration may include:  Drinking an oral rehydration solution (ORS). This is a drink that helps you replace fluids and electrolytes (rehydrate). It can be found at pharmacies and retail stores. Treatment for severe dehydration may include:  Receiving fluids through an IV tube.  Receiving an electrolyte solution through a feeding tube that is passed through your nose   and into your stomach (nasogastric tube, or NG tube).  Correcting any abnormalities in electrolytes.  Treating the underlying cause of dehydration. Follow these instructions at home:  If directed by your health care provider, drink an ORS: ? Make an ORS by following instructions on the  package. ? Start by drinking small amounts, about  cup (120 mL) every 5-10 minutes. ? Slowly increase how much you drink until you have taken the amount recommended by your health care provider.  Drink enough clear fluid to keep your urine clear or pale yellow. If you were told to drink an ORS, finish the ORS first, then start slowly drinking other clear fluids. Drink fluids such as: ? Water. Do not drink only water. Doing that can lead to having too little salt (sodium) in the body (hyponatremia). ? Ice chips. ? Fruit juice that you have added water to (diluted fruit juice). ? Low-calorie sports drinks.  Avoid: ? Alcohol. ? Drinks that contain a lot of sugar. These include high-calorie sports drinks, fruit juice that is not diluted, and soda. ? Caffeine. ? Foods that are greasy or contain a lot of fat or sugar.  Take over-the-counter and prescription medicines only as told by your health care provider.  Do not take sodium tablets. This can lead to having too much sodium in the body (hypernatremia).  Eat foods that contain a healthy balance of electrolytes, such as bananas, oranges, potatoes, tomatoes, and spinach.  Keep all follow-up visits as told by your health care provider. This is important. Contact a health care provider if:  You have abdominal pain that: ? Gets worse. ? Stays in one area (localizes).  You have a rash.  You have a stiff neck.  You are more irritable than usual.  You are sleepier or more difficult to wake up than usual.  You feel weak or dizzy.  You feel very thirsty.  You have urinated only a small amount of very dark urine over 6-8 hours. Get help right away if:  You have symptoms of severe dehydration.  You cannot drink fluids without vomiting.  Your symptoms get worse with treatment.  You have a fever.  You have a severe headache.  You have vomiting or diarrhea that: ? Gets worse. ? Does not go away.  You have blood or green matter  (bile) in your vomit.  You have blood in your stool. This may cause stool to look black and tarry.  You have not urinated in 6-8 hours.  You faint.  Your heart rate while sitting still is over 100 beats a minute.  You have trouble breathing. This information is not intended to replace advice given to you by your health care provider. Make sure you discuss any questions you have with your health care provider. Document Released: 08/10/2005 Document Revised: 03/06/2016 Document Reviewed: 10/04/2015 Elsevier Interactive Patient Education  2018 [employer]  

## 2017-09-21 ENCOUNTER — Inpatient Hospital Stay: Payer: 59

## 2017-09-21 ENCOUNTER — Other Ambulatory Visit: Payer: Self-pay | Admitting: *Deleted

## 2017-09-21 ENCOUNTER — Encounter: Payer: Self-pay | Admitting: Nurse Practitioner

## 2017-09-21 ENCOUNTER — Other Ambulatory Visit: Payer: Self-pay

## 2017-09-21 ENCOUNTER — Inpatient Hospital Stay (HOSPITAL_BASED_OUTPATIENT_CLINIC_OR_DEPARTMENT_OTHER): Payer: 59 | Admitting: Nurse Practitioner

## 2017-09-21 VITALS — BP 140/85 | HR 95 | Temp 98.4°F | Resp 18 | Ht 65.0 in | Wt 123.0 lb

## 2017-09-21 DIAGNOSIS — C251 Malignant neoplasm of body of pancreas: Secondary | ICD-10-CM

## 2017-09-21 DIAGNOSIS — R7989 Other specified abnormal findings of blood chemistry: Secondary | ICD-10-CM | POA: Diagnosis not present

## 2017-09-21 DIAGNOSIS — R1111 Vomiting without nausea: Secondary | ICD-10-CM

## 2017-09-21 DIAGNOSIS — D72829 Elevated white blood cell count, unspecified: Secondary | ICD-10-CM

## 2017-09-21 DIAGNOSIS — C787 Secondary malignant neoplasm of liver and intrahepatic bile duct: Secondary | ICD-10-CM | POA: Diagnosis not present

## 2017-09-21 DIAGNOSIS — E876 Hypokalemia: Secondary | ICD-10-CM

## 2017-09-21 DIAGNOSIS — R634 Abnormal weight loss: Secondary | ICD-10-CM

## 2017-09-21 DIAGNOSIS — R05 Cough: Secondary | ICD-10-CM

## 2017-09-21 DIAGNOSIS — R0989 Other specified symptoms and signs involving the circulatory and respiratory systems: Secondary | ICD-10-CM

## 2017-09-21 DIAGNOSIS — C259 Malignant neoplasm of pancreas, unspecified: Secondary | ICD-10-CM

## 2017-09-21 DIAGNOSIS — Z5111 Encounter for antineoplastic chemotherapy: Secondary | ICD-10-CM | POA: Diagnosis not present

## 2017-09-21 DIAGNOSIS — Z95828 Presence of other vascular implants and grafts: Secondary | ICD-10-CM

## 2017-09-21 DIAGNOSIS — Z7189 Other specified counseling: Secondary | ICD-10-CM

## 2017-09-21 DIAGNOSIS — R630 Anorexia: Secondary | ICD-10-CM

## 2017-09-21 DIAGNOSIS — K859 Acute pancreatitis without necrosis or infection, unspecified: Secondary | ICD-10-CM | POA: Diagnosis not present

## 2017-09-21 LAB — COMPREHENSIVE METABOLIC PANEL
ALT: 33 U/L (ref 0–55)
AST: 75 U/L — AB (ref 5–34)
Albumin: 3 g/dL — ABNORMAL LOW (ref 3.5–5.0)
Alkaline Phosphatase: 342 U/L — ABNORMAL HIGH (ref 40–150)
Anion gap: 11 (ref 3–11)
BUN: 9 mg/dL (ref 7–26)
CHLORIDE: 103 mmol/L (ref 98–109)
CO2: 28 mmol/L (ref 22–29)
CREATININE: 1.47 mg/dL — AB (ref 0.60–1.10)
Calcium: 9.7 mg/dL (ref 8.4–10.4)
GFR calc Af Amer: 46 mL/min — ABNORMAL LOW (ref >60)
GFR calc non Af Amer: 39 mL/min — ABNORMAL LOW (ref >60)
Glucose, Bld: 113 mg/dL (ref 70–140)
POTASSIUM: 3 mmol/L — AB (ref 3.3–4.7)
SODIUM: 142 mmol/L (ref 136–145)
Total Bilirubin: 0.4 mg/dL (ref 0.2–1.2)
Total Protein: 6.9 g/dL (ref 6.4–8.3)

## 2017-09-21 LAB — CBC WITH DIFFERENTIAL/PLATELET
BASOS ABS: 0.1 10*3/uL (ref 0.0–0.1)
BASOS PCT: 0 %
EOS ABS: 0.1 10*3/uL (ref 0.0–0.5)
EOS PCT: 0 %
HCT: 34.2 % — ABNORMAL LOW (ref 34.8–46.6)
Hemoglobin: 11 g/dL — ABNORMAL LOW (ref 11.6–15.9)
Lymphocytes Relative: 8 %
Lymphs Abs: 2.4 10*3/uL (ref 0.9–3.3)
MCH: 30.6 pg (ref 25.1–34.0)
MCHC: 32.2 g/dL (ref 31.5–36.0)
MCV: 95.3 fL (ref 79.5–101.0)
Monocytes Absolute: 2.2 10*3/uL — ABNORMAL HIGH (ref 0.1–0.9)
Monocytes Relative: 8 %
Neutro Abs: 24 10*3/uL — ABNORMAL HIGH (ref 1.5–6.5)
Neutrophils Relative %: 84 %
PLATELETS: 185 10*3/uL (ref 145–400)
RBC: 3.59 MIL/uL — AB (ref 3.70–5.45)
RDW: 17.8 % — ABNORMAL HIGH (ref 11.2–16.1)
WBC: 28.9 10*3/uL — AB (ref 3.9–10.3)

## 2017-09-21 LAB — MAGNESIUM: MAGNESIUM: 1.5 mg/dL (ref 1.5–2.5)

## 2017-09-21 LAB — LIPASE, BLOOD: Lipase: 558 U/L — ABNORMAL HIGH (ref 11–51)

## 2017-09-21 MED ORDER — SODIUM CHLORIDE 0.9 % IV SOLN
400.0000 mg/m2 | Freq: Once | INTRAVENOUS | Status: AC
  Administered 2017-09-21: 668 mg via INTRAVENOUS
  Filled 2017-09-21: qty 33.4

## 2017-09-21 MED ORDER — SODIUM CHLORIDE 0.9 % IV SOLN
Freq: Once | INTRAVENOUS | Status: AC
  Administered 2017-09-21: 10:00:00 via INTRAVENOUS

## 2017-09-21 MED ORDER — SODIUM CHLORIDE 0.9 % IV SOLN
Freq: Once | INTRAVENOUS | Status: AC
  Administered 2017-09-21: 11:00:00 via INTRAVENOUS
  Filled 2017-09-21: qty 5

## 2017-09-21 MED ORDER — SODIUM CHLORIDE 0.9% FLUSH
10.0000 mL | Freq: Once | INTRAVENOUS | Status: AC
  Administered 2017-09-21: 10 mL
  Filled 2017-09-21: qty 10

## 2017-09-21 MED ORDER — OXALIPLATIN CHEMO INJECTION 100 MG/20ML
70.0000 mg/m2 | Freq: Once | INTRAVENOUS | Status: AC
  Administered 2017-09-21: 115 mg via INTRAVENOUS
  Filled 2017-09-21: qty 20

## 2017-09-21 MED ORDER — PALONOSETRON HCL INJECTION 0.25 MG/5ML
INTRAVENOUS | Status: AC
  Filled 2017-09-21: qty 5

## 2017-09-21 MED ORDER — SODIUM CHLORIDE 0.9 % IV SOLN: Freq: Once | INTRAVENOUS | Status: DC

## 2017-09-21 MED ORDER — DEXTROSE 5 % IV SOLN
Freq: Once | INTRAVENOUS | Status: AC
  Administered 2017-09-21: 12:00:00 via INTRAVENOUS

## 2017-09-21 MED ORDER — PALONOSETRON HCL INJECTION 0.25 MG/5ML
0.2500 mg | Freq: Once | INTRAVENOUS | Status: AC
  Administered 2017-09-21: 0.25 mg via INTRAVENOUS

## 2017-09-21 MED ORDER — SODIUM CHLORIDE 0.9 % IV SOLN
160.0000 mg/m2 | Freq: Once | INTRAVENOUS | Status: AC
  Administered 2017-09-21: 260 mg via INTRAVENOUS
  Filled 2017-09-21: qty 13

## 2017-09-21 MED ORDER — ATROPINE SULFATE 1 MG/ML IJ SOLN
INTRAMUSCULAR | Status: AC
  Filled 2017-09-21: qty 1

## 2017-09-21 MED ORDER — PROCHLORPERAZINE MALEATE 10 MG PO TABS: 10.0000 mg | ORAL_TABLET | Freq: Four times a day (QID) | ORAL | 2 refills | Status: DC | PRN

## 2017-09-21 MED ORDER — SODIUM CHLORIDE 0.9 % IV SOLN
2400.0000 mg/m2 | INTRAVENOUS | Status: DC
  Administered 2017-09-21: 4000 mg via INTRAVENOUS
  Filled 2017-09-21: qty 80

## 2017-09-21 MED ORDER — ATROPINE SULFATE 1 MG/ML IJ SOLN
0.5000 mg | Freq: Once | INTRAMUSCULAR | Status: AC | PRN
  Administered 2017-09-21: 0.5 mg via INTRAVENOUS

## 2017-09-21 NOTE — Progress Notes (Signed)
Morgan Dawson  Telephone:([phone] [phone]) [phone]  Clinic Follow up Note   Patient Care Team: [name], MD as PCP - General (Family Medicine) [name], MD as Consulting Physician (Otolaryngology) 09/21/2017  CHIEF COMPLAINT: Follow up metastatic pancreatic cancer to liver   SUMMARY OF ONCOLOGIC HISTORY: Oncology History   Cancer Staging Pancreatic cancer [facility]) Staging form: Exocrine Pancreas, AJCC 8th Edition - Clinical stage from 08/06/2017: Stage IV (cTX, cN0, pM1) - Signed by [name], MD on 08/12/2017       Pancreatic cancer ([place])   07/23/2017 Imaging    US abdomen limited RUQ 07/23/17 IMPRESSION: 1. Cholelithiasis.  No secondary signs of acute cholecystitis. 2. Multiple solid liver masses measuring up to 6.5 cm in the left lobe of the liver, evaluation for metastatic disease is recommended. These results will be called to the ordering clinician or representative by the Radiologist Assistant, and communication documented in the PACS or zVision Dashboard.      07/23/2017 Imaging    CT Abdomen W Contrast 07/23/17 IMPRESSION: 1. Widespread metastatic disease throughout the liver. No clear primary malignancy identified in the abdomen. The pelvis was not imaged. Tissue sampling recommended. 2. Probable adenopathy superior to the pancreatic tail. No evidence of pancreatic mass. 3. Suspected incidental hemangioma inferiorly in the right hepatic lobe. 4. Nonspecific nodularity in the breasts. The patient has undergone recent (03/25/2017 and 04/01/2017) mammography and ultrasound.      07/29/2017 Initial Diagnosis    Metastasis to liver of unknown origin ([place])      07/29/2017 PET scan    PET 07/29/17  IMPRESSION: 1. Numerous bulky liver masses are hypermetabolic compatible with malignancy. 2. Accentuated activity within or along the pancreatic tail, likely represent a primary pancreatic tumor. Consider pancreatic protocol MRI to  further work this up. 3. The peripancreatic lymph node shown above the pancreatic tail is mildly hypermetabolic favoring malignancy. 4.  Prominent stool throughout the colon favors constipation. 5. 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      08/05/2017 Pathology Results    Liver Biopsy  Diagnosis 08/05/17 Liver, needle/core biopsy - CARCINOMA. - SEE COMMENT. Microscopic Comment The malignant cells are positive for cytokeratin 7. They are negative for arginase, CDX2, cytokeratin 20, estrogen receptor, GATA-3, GCDFP, Glypican 3, Hep Par 1, Napsin A, and TTF-1. This immunohistochemical is nonspecific. Possibly primary sources include pancreatobiliary and upper gastrointestinal. Radiologic correlation is necessary. Of note, organ specific markers (GATA-3, GCDFP-breast, TTF-1, Napsin A-lung, and CDX2-colon) are negative. (JBK:ecj 08/09/2017)       Chemotherapy    PENDING FOLFIRINOX every 2 weeks starting 08/21/17       CURRENT THERAPY: FOLFIRINOX every 2 weeks starting 08/21/17   INTERVAL HISTORY: [name] returns for follow up as scheduled prior to cycle 3 FOLFIRINOX. She received cycle 2 at full dose on 1/14. On day 1 she had significant cold sensitivity and tingling in hands, function was limited as she could not hold a pen or open food packaging. Functional abilities returned by next day but continued to have intermittent tingling to fingertips. On day 1 of last cycle she had episode of diarrhea requiring imodium which lead to 4 days of constipation. She took dulcolax which causes intermittent diarrhea. She notes intermittent nausea with infrequent vomiting 3-4 days after chemo, controlled with PRN compazine. Reports improvement in abdominal pain and bloating from days 4-12 of chemo cycle, pressure begins to build roughly 1-2 days before next treatment. She eats 3 meals per day with variety of  foods including 1 carnation instant breakfast. Lately she has mild nasal congestion and  intermittent dry cough, no fever or chills. Overall has been feeling "pretty good."    REVIEW OF SYSTEMS:   Constitutional: Denies fevers, chills (+)  abnormal weight loss (+) decreased appetite (+) moderate fatigue  Eyes: Denies blurriness of vision Ears, nose, mouth, throat, and face: Denies mucositis or sore throat (+) nasal congestion  Respiratory: Denies dyspnea or wheezes (+) intermittent dry cough Cardiovascular: Denies palpitation, chest discomfort  (+) lower extremity swelling Gastrointestinal:  Denies heartburn or hematochezia (+) intermittent mild nausea with rare emesis 3-4 days after chemo (+) alternate diarrhea and constipation (+) abdominal pain and bloating improves on days 4-12 of chemo cycle, pressure builds 1-2 days prior to treatment  Skin: Denies abnormal skin rashes Lymphatics: Denies new lymphadenopathy or easy bruising Neurological:Denies numbness or new weaknesses (+) cold sensitivity worse day after treatment (+) tingling with functional difficulties x1 day after chemo (+) intermittent tingling to fingertips   Behavioral/Psych: Mood is stable, no new changes (+) inturrupted sleep, sleeps in 2-3 hour increments   All other systems were reviewed with the patient and are negative.  MEDICAL HISTORY:  Past Medical History:  Diagnosis Date  . Meniere disease 2016  . Pancreatitis     SURGICAL HISTORY: Past Surgical History:  Procedure Laterality Date  . CERVICAL ABLATION    . ENDOMETRIAL ABLATION  2011  . IR FLUORO GUIDE PORT INSERTION RIGHT  08/12/2017  . IR US GUIDE VASC ACCESS RIGHT  08/12/2017  . [place]    I have reviewed the social history and family history with the patient and they are unchanged from previous note.  ALLERGIES:  has No Known Allergies.  MEDICATIONS:  Current Outpatient Medications  Medication Sig Dispense Refill  . bisacodyl (DULCOLAX) 5 MG EC tablet Take 5 mg by mouth daily as needed for mild constipation or moderate  constipation.    . lidocaine-prilocaine (EMLA) cream Apply to affected area once 30 g 3  . loratadine (CLARITIN) 10 MG tablet Take 10 mg by mouth daily as needed for allergies.    . magic mouthwash w/lidocaine SOLN Take 10 mLs by mouth 3 (three) times daily as needed for mouth pain. Swish and spit 10 ML by mouth 3 times daily as needed for mouth pain. 240 mL 0  . meclizine (ANTIVERT) 25 MG tablet Take 25 mg by mouth 3 (three) times daily as needed for dizziness.    . ondansetron (ZOFRAN-ODT) 8 MG disintegrating tablet Take 1 tablet (8 mg total) by mouth every 8 (eight) hours as needed for nausea or vomiting. 40 tablet 2  . potassium chloride (MICRO-K) 10 MEQ CR capsule Take 2 capsules (20 mEq total) by mouth 2 (two) times daily. Take 2 capsules twice per day for 5 days then take 2 capsules daily 70 capsule 0  . prochlorperazine (COMPAZINE) 10 MG tablet Take 1 tablet (10 mg total) by mouth every 6 (six) hours as needed for nausea or vomiting. 40 tablet 2  . spironolactone (ALDACTONE) 25 MG tablet Take 1 tablet (25 mg total) by mouth daily. 90 tablet 1  . traMADol (ULTRAM) 50 MG tablet Take 1 tablet (50 mg total) by mouth every 6 (six) hours as needed. 30 tablet 0  . polyethylene glycol (MIRALAX / GLYCOLAX) packet Take 17 g by mouth daily as needed for mild constipation.     No current facility-administered medications for this visit.     PHYSICAL EXAMINATION:  ECOG PERFORMANCE STATUS: 2 - Symptomatic, <50% confined to bed  Vitals:   09/21/17 0845  BP: 140/85  Pulse: 95  Resp: 18  Temp: 98.4 F (36.9 C)  SpO2: 98%   Filed Weights   09/21/17 0845  Weight: 123 lb (55.8 kg)    GENERAL:alert, no distress and comfortable SKIN: skin color, texture, turgor are normal, no rashes or significant lesions EYES: normal, Conjunctiva are pink and non-injected, sclera clear OROPHARYNX:no exudate, no erythema and lips, buccal mucosa, and tongue normal  NECK: supple, thyroid normal size, non-tender,  without nodularity LYMPH:  no palpable cervical, supraclavicular, axillary, or inguinal lymphadenopathy LUNGS: clear to auscultation bilaterally with normal breathing effort HEART: regular rate & rhythm and no murmurs (+) trace bilateral lower extremity edema ABDOMEN:abdomen soft and normal bowel sounds (+) hepatomegaly with mild tenderness on palpation Musculoskeletal:no cyanosis of digits and no clubbing  NEURO: alert & oriented x 3 with fluent speech, no focal motor/sensory deficits PAC without erythema   LABORATORY DATA:  I have reviewed the data as listed CBC Latest Ref Rng & Units 09/21/2017 09/13/2017 09/06/2017  WBC 3.9 - 10.3 K/uL 28.9(H) 10.1 18.9(H)  Hemoglobin 11.6 - 15.9 g/dL 11.0(L) 10.4(L) 10.7(L)  Hematocrit 34.8 - 46.6 % 34.2(L) 31.2(L) 34.1(L)  Platelets 145 - 400 K/uL 185 223 206     CMP Latest Ref Rng & Units 09/21/2017 09/13/2017 09/06/2017  Glucose 70 - 140 mg/dL 113 99 97  BUN 7 - 26 mg/dL 9 9 7   Creatinine 0.60 - 1.10 mg/dL 1.47(H) 1.09 1.00  Sodium 136 - 145 mmol/L 142 140 141  Potassium 3.3 - 4.7 mmol/L 3.0(LL) 3.1(L) 3.0(LL)  Chloride 98 - 109 mmol/L 103 109 106  CO2 22 - 29 mmol/L 28 23 26   Calcium 8.4 - 10.4 mg/dL 9.7 7.6(L) 8.5  Total Protein 6.4 - 8.3 g/dL 6.9 5.9(L) 6.3(L)  Total Bilirubin 0.2 - 1.2 mg/dL 0.4 0.5 0.6  Alkaline Phos 40 - 150 U/L 342(H) 369(H) 417(H)  AST 5 - 34 U/L 75(H) 87(H) 126(H)  ALT 0 - 55 U/L 33 43 65(H)   PATHOLOGY  Liver Biopsy  Diagnosis 08/05/17 Liver, needle/core biopsy - CARCINOMA. - SEE COMMENT. Microscopic Comment The malignant cells are positive for cytokeratin 7. They are negative for arginase, CDX2, cytokeratin 20, estrogen receptor, GATA-3, GCDFP, Glypican 3, Hep Par 1, Napsin A, and TTF-1. This immunohistochemical is nonspecific. Possibly primary sources include pancreatobiliary and upper gastrointestinal. Radiologic correlation is necessary. Of note, organ specific markers (GATA-3, GCDFP-breast, TTF-1, Napsin  A-lung, and CDX2-colon) are negative. (JBK:ecj 08/09/2017)   RADIOGRAPHIC STUDIES: I have personally reviewed the radiological images as listed and agreed with the findings in the report. No results found.   ASSESSMENT & PLAN: HAYDN CUSH is a 57 y.o. caucasian female with a history of vertigo and menieres disease, presented with epigastric discomfort, low appetite and 5 pound weight loss   1. 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 Pancreatitis 3. Nausea and vomiting 4. Anorexia, weight loss 5. Hypercalcemia 6. Transaminitis 7. Cough, congestion 8. Hypokalemia 9. Elevated creatinine  Ms. Turnipseed appears stable today.  She completed 2 cycles of FOLFIRINOX chemotherapy. Continues to have weight loss and GI side effects which are overall manageable. I suggest she try 1/4 to 1/2 dose miralax when constipated, increase water, and add fiber to her diet. Hopefully avoid diarrhea. Continue compazine PRN for nausea, I refilled today; she will get IV Emend today as premed. I suggest she add 2  small snacks to her intake and/or increase nutritional supplements as tolerated to add calories and prevent further weight loss; she agrees. We discussed Remeron for appetite stimulation and to improve sleep; she declines at this time.   Labs reviewed, she has leukocytosis possibly from neulasta. She has mild URI symptoms with cough and congestion; afebrile. We reviewed worsening symptoms that she should call to report. LFTs remain elevated with slight improvement on chemotherapy, secondary to liver metastasis. Elevated lipase secondary to pancreatitis is trending down. She has acutely increased creatinine, 1.47; could be some degree of dehydration. Will add on IVF to pump d/c on day 3. We discussed avoiding nephrotoxic medication such as her aldactone for Meniere's and NSAIDs in general. For hypokalemia, I recommend increasing oral supplement to 40 mEq daily x3 days, will recheck labs  on day 3. Will add K to IVF pending labs and check Mg today to rule out hypomagnesemia. Case discussed with Dr. Burr Medico. Will proceed with cycle 3 FOLFIRINOX today with dose reductions of irinotecan and oxaliplatin. Return next week for labs and f/u in 2 weeks prior to cycle 4.  PLAN -Labs reviewed, proceed with cycle 3 FOLFIRINOX with dose-reduced irinotecan and oxaliplatin -Increase oral K to 40 mg daily x3 days, recheck K on day 3 -Add Mg to labs -Labs and IVF on day 3 with pump d/c, will add K to IVF depending on level  -Hold aldactone (for meniere's) for now; avoid NSAIDs -Refill compazine, will get IV Emend from cycle 3 -Add snacks and/or nutrition supplements to po intake, continue adequate water intake  -Add on labs next week to monitor renal function  All questions were answered. The patient knows to call the clinic with any problems, questions or concerns. No barriers to learning was detected. I spent 20 minutes counseling the patient face to face. The total time spent in the appointment was 25 minutes and more than 50% was on counseling and review of test results.      Alla Feeling, NP 09/21/17

## 2017-09-21 NOTE — Patient Instructions (Signed)
[facility] Discharge Instructions for Patients Receiving Chemotherapy  Today you received the following chemotherapy agents Oxaliplatin, Leucovorin, Irinotecan, Adrucil.  To help prevent nausea and vomiting after your treatment, we encourage you to take your nausea medication as prescribed.   If you develop nausea and vomiting that is not controlled by your nausea medication, call the clinic.   BELOW ARE SYMPTOMS THAT SHOULD BE REPORTED IMMEDIATELY:  *FEVER GREATER THAN 100.5 F  *CHILLS WITH OR WITHOUT FEVER  NAUSEA AND VOMITING THAT IS NOT CONTROLLED WITH YOUR NAUSEA MEDICATION  *UNUSUAL SHORTNESS OF BREATH  *UNUSUAL BRUISING OR BLEEDING  TENDERNESS IN MOUTH AND THROAT WITH OR WITHOUT PRESENCE OF ULCERS  *URINARY PROBLEMS  *BOWEL PROBLEMS  UNUSUAL RASH Items with * indicate a potential emergency and should be followed up as soon as possible.  Feel free to call the clinic should you have any questions or concerns. The clinic phone number is [phone].  Please show the [place] at check-in to the Emergency Department and triage nurse.

## 2017-09-21 NOTE — Progress Notes (Addendum)
Nutrition Follow-up:  Patient with metastatic pancreatic cancer followed by Dr. Burr Medico.    Met with patient during infusion today.  Patient reports some mild nausea but controlled with medications.  Reports constipation then takes something then has diarrhea.  Reports that she has been eating better about every few hours.  Usually has carnation instant breakfast mixed with 1% milk for breakfast then few hours later cereal with blueberries then few house later lunch of 1/2 meat and cheese sandwich with sliced tomato.  Reports she has gotten sick off of eggs and have not added then back into her diet.  Reports dinner is usually meal that someone else has prepared for her (meat with vegetables, potato or salad).    Medications: reviewed  Labs: K 3.0, creatinine 1.47  Anthropometrics:   Weight 123 lb today decreased from 131 lb 8 oz on 1/14.    6% weight loss in the last 2 weeks.     NUTRITION DIAGNOSIS: Food and nutrition related knowledge deficit improving.    INTERVENTION:   Encouraged patient to continue to eat every few hours.   Patient will try adding carnation instant breakfast in the afternoon before supper. Discussed trying mixing with 2% milk for added calories. Patient will monitor for increased abdominal pain, diarrhea, GI issues.   Discussed other ways of increasing calories and protein to current regimen.      MONITORING, EVALUATION, GOAL: Patient will tolerate increased calories and protein to minimize weight loss.    NEXT VISIT: Monday, Feb 25 during infusion  Jowana Thumma B. Zenia Resides, [place], [place] Registered Dietitian [phone] (pager)

## 2017-09-22 LAB — CANCER ANTIGEN 19-9: CAN 19-9: 67 U/mL — AB (ref 0–35)

## 2017-09-23 ENCOUNTER — Inpatient Hospital Stay: Payer: 59

## 2017-09-23 ENCOUNTER — Other Ambulatory Visit: Payer: Self-pay | Admitting: Nurse Practitioner

## 2017-09-23 ENCOUNTER — Inpatient Hospital Stay: Payer: 59 | Admitting: Genetic Counselor

## 2017-09-23 ENCOUNTER — Encounter: Payer: Self-pay | Admitting: Genetic Counselor

## 2017-09-23 ENCOUNTER — Ambulatory Visit: Payer: Self-pay | Admitting: Genetic Counselor

## 2017-09-23 DIAGNOSIS — C787 Secondary malignant neoplasm of liver and intrahepatic bile duct: Secondary | ICD-10-CM

## 2017-09-23 DIAGNOSIS — C259 Malignant neoplasm of pancreas, unspecified: Secondary | ICD-10-CM

## 2017-09-23 DIAGNOSIS — C251 Malignant neoplasm of body of pancreas: Secondary | ICD-10-CM

## 2017-09-23 DIAGNOSIS — Z7189 Other specified counseling: Secondary | ICD-10-CM

## 2017-09-23 DIAGNOSIS — Z5111 Encounter for antineoplastic chemotherapy: Secondary | ICD-10-CM | POA: Diagnosis not present

## 2017-09-23 DIAGNOSIS — Z95828 Presence of other vascular implants and grafts: Secondary | ICD-10-CM

## 2017-09-23 LAB — COMPREHENSIVE METABOLIC PANEL
ALBUMIN: 2.9 g/dL — AB (ref 3.5–5.0)
ALK PHOS: 319 U/L — AB (ref 40–150)
ALT: 37 U/L (ref 0–55)
AST: 97 U/L — AB (ref 5–34)
Anion gap: 10 (ref 3–11)
BILIRUBIN TOTAL: 0.4 mg/dL (ref 0.2–1.2)
BUN: 19 mg/dL (ref 7–26)
CALCIUM: 9 mg/dL (ref 8.4–10.4)
CO2: 27 mmol/L (ref 22–29)
CREATININE: 1.44 mg/dL — AB (ref 0.60–1.10)
Chloride: 104 mmol/L (ref 98–109)
GFR calc Af Amer: 47 mL/min — ABNORMAL LOW (ref >60)
GFR calc non Af Amer: 40 mL/min — ABNORMAL LOW (ref >60)
GLUCOSE: 95 mg/dL (ref 70–140)
Potassium: 3.3 mmol/L — ABNORMAL LOW (ref 3.5–5.1)
Sodium: 141 mmol/L (ref 136–145)
TOTAL PROTEIN: 6.7 g/dL (ref 6.4–8.3)

## 2017-09-23 MED ORDER — HEPARIN SOD (PORK) LOCK FLUSH 100 UNIT/ML IV SOLN
500.0000 [IU] | Freq: Once | INTRAVENOUS | Status: AC | PRN
  Administered 2017-09-23: 500 [IU]
  Filled 2017-09-23: qty 5

## 2017-09-23 MED ORDER — SODIUM CHLORIDE 0.9 % IV SOLN
Freq: Once | INTRAVENOUS | Status: AC
  Administered 2017-09-23: 15:00:00 via INTRAVENOUS

## 2017-09-23 MED ORDER — SODIUM CHLORIDE 0.9% FLUSH
10.0000 mL | INTRAVENOUS | Status: DC | PRN
  Administered 2017-09-23: 10 mL
  Filled 2017-09-23: qty 10

## 2017-09-23 NOTE — Patient Instructions (Signed)
Dehydration, Adult Dehydration is when there is not enough fluid or water in your body. This happens when you lose more fluids than you take in. Dehydration can range from mild to very bad. It should be treated right away to keep it from getting very bad. Symptoms of mild dehydration may include:  Thirst.  Dry lips.  Slightly dry mouth.  Dry, warm skin.  Dizziness. Symptoms of moderate dehydration may include:  Very dry mouth.  Muscle cramps.  Dark pee (urine). Pee may be the color of tea.  Your body making less pee.  Your eyes making fewer tears.  Heartbeat that is uneven or faster than normal (palpitations).  Headache.  Light-headedness, especially when you stand up from sitting.  Fainting (syncope). Symptoms of very bad dehydration may include:  Changes in skin, such as: ? Cold and clammy skin. ? Blotchy (mottled) or pale skin. ? Skin that does not quickly return to normal after being lightly pinched and let go (poor skin turgor).  Changes in body fluids, such as: ? Feeling very thirsty. ? Your eyes making fewer tears. ? Not sweating when body temperature is high, such as in hot weather. ? Your body making very little pee.  Changes in vital signs, such as: ? Weak pulse. ? Pulse that is more than 100 beats a minute when you are sitting still. ? Fast breathing. ? Low blood pressure.  Other changes, such as: ? Sunken eyes. ? Cold hands and feet. ? Confusion. ? Lack of energy (lethargy). ? Trouble waking up from sleep. ? Short-term weight loss. ? Unconsciousness. Follow these instructions at home:  If told by your doctor, drink an ORS: ? Make an ORS by using instructions on the package. ? Start by drinking small amounts, about  cup (120 mL) every 5-10 minutes. ? Slowly drink more until you have had the amount that your doctor said to have.  Drink enough clear fluid to keep your pee clear or pale yellow. If you were told to drink an ORS, finish the ORS  first, then start slowly drinking clear fluids. Drink fluids such as: ? Water. Do not drink only water by itself. Doing that can make the salt (sodium) level in your body get too low (hyponatremia). ? Ice chips. ? Fruit juice that you have added water to (diluted). ? Low-calorie sports drinks.  Avoid: ? Alcohol. ? Drinks that have a lot of sugar. These include high-calorie sports drinks, fruit juice that does not have water added, and soda. ? Caffeine. ? Foods that are greasy or have a lot of fat or sugar.  Take over-the-counter and prescription medicines only as told by your doctor.  Do not take salt tablets. Doing that can make the salt level in your body get too high (hypernatremia).  Eat foods that have minerals (electrolytes). Examples include bananas, oranges, potatoes, tomatoes, and spinach.  Keep all follow-up visits as told by your doctor. This is important. Contact a doctor if:  You have belly (abdominal) pain that: ? Gets worse. ? Stays in one area (localizes).  You have a rash.  You have a stiff neck.  You get angry or annoyed more easily than normal (irritability).  You are more sleepy than normal.  You have a harder time waking up than normal.  You feel: ? Weak. ? Dizzy. ? Very thirsty.  You have peed (urinated) only a small amount of very dark pee during 6-8 hours. Get help right away if:  You have symptoms of   very bad dehydration.  You cannot drink fluids without throwing up (vomiting).  Your symptoms get worse with treatment.  You have a fever.  You have a very bad headache.  You are throwing up or having watery poop (diarrhea) and it: ? Gets worse. ? Does not go away.  You have blood or something green (bile) in your throw-up.  You have blood in your poop (stool). This may cause poop to look black and tarry.  You have not peed in 6-8 hours.  You pass out (faint).  Your heart rate when you are sitting still is more than 100 beats a  minute.  You have trouble breathing. This information is not intended to replace advice given to you by your health care provider. Make sure you discuss any questions you have with your health care provider. Document Released: 06/06/2009 Document Revised: 02/28/2016 Document Reviewed: 10/04/2015 Elsevier Interactive Patient Education  2018 Elsevier Inc.  

## 2017-09-27 ENCOUNTER — Other Ambulatory Visit: Payer: Self-pay | Admitting: Nurse Practitioner

## 2017-09-27 DIAGNOSIS — E876 Hypokalemia: Secondary | ICD-10-CM

## 2017-09-28 ENCOUNTER — Inpatient Hospital Stay: Payer: 59 | Attending: Hematology

## 2017-09-28 DIAGNOSIS — Z5111 Encounter for antineoplastic chemotherapy: Secondary | ICD-10-CM | POA: Diagnosis not present

## 2017-09-28 DIAGNOSIS — C787 Secondary malignant neoplasm of liver and intrahepatic bile duct: Secondary | ICD-10-CM | POA: Insufficient documentation

## 2017-09-28 DIAGNOSIS — C251 Malignant neoplasm of body of pancreas: Secondary | ICD-10-CM

## 2017-09-28 DIAGNOSIS — C259 Malignant neoplasm of pancreas, unspecified: Secondary | ICD-10-CM | POA: Insufficient documentation

## 2017-09-28 LAB — COMPREHENSIVE METABOLIC PANEL
ALK PHOS: 300 U/L — AB (ref 40–150)
ALT: 42 U/L (ref 0–55)
AST: 94 U/L — ABNORMAL HIGH (ref 5–34)
Albumin: 3.2 g/dL — ABNORMAL LOW (ref 3.5–5.0)
Anion gap: 12 — ABNORMAL HIGH (ref 3–11)
BILIRUBIN TOTAL: 0.5 mg/dL (ref 0.2–1.2)
BUN: 13 mg/dL (ref 7–26)
CALCIUM: 8.8 mg/dL (ref 8.4–10.4)
CHLORIDE: 103 mmol/L (ref 98–109)
CO2: 25 mmol/L (ref 22–29)
CREATININE: 1.65 mg/dL — AB (ref 0.60–1.10)
GFR, EST AFRICAN AMERICAN: 40 mL/min — AB (ref >60)
GFR, EST NON AFRICAN AMERICAN: 34 mL/min — AB (ref >60)
Glucose, Bld: 134 mg/dL (ref 70–140)
Potassium: 3.3 mmol/L — ABNORMAL LOW (ref 3.5–5.1)
Sodium: 140 mmol/L (ref 136–145)
TOTAL PROTEIN: 7.5 g/dL (ref 6.4–8.3)

## 2017-09-28 LAB — CBC WITH DIFFERENTIAL/PLATELET
BASOS ABS: 0.1 10*3/uL (ref 0.0–0.1)
BASOS PCT: 3 %
Eosinophils Absolute: 0.1 10*3/uL (ref 0.0–0.5)
Eosinophils Relative: 2 %
HEMATOCRIT: 32 % — AB (ref 34.8–46.6)
HEMOGLOBIN: 10.9 g/dL — AB (ref 11.6–15.9)
Lymphocytes Relative: 17 %
Lymphs Abs: 0.8 10*3/uL — ABNORMAL LOW (ref 0.9–3.3)
MCH: 31.2 pg (ref 25.1–34.0)
MCHC: 34.1 g/dL (ref 31.5–36.0)
MCV: 91.5 fL (ref 79.5–101.0)
Monocytes Absolute: 0.2 10*3/uL (ref 0.1–0.9)
Monocytes Relative: 4 %
NEUTROS ABS: 3.3 10*3/uL (ref 1.5–6.5)
NEUTROS PCT: 74 %
Platelets: 243 10*3/uL (ref 145–400)
RBC: 3.5 MIL/uL — AB (ref 3.70–5.45)
RDW: 17.8 % — ABNORMAL HIGH (ref 11.2–14.5)
WBC: 4.4 10*3/uL (ref 3.9–10.3)

## 2017-10-04 ENCOUNTER — Inpatient Hospital Stay: Payer: 59

## 2017-10-04 ENCOUNTER — Encounter: Payer: Self-pay | Admitting: Nurse Practitioner

## 2017-10-04 ENCOUNTER — Inpatient Hospital Stay (HOSPITAL_BASED_OUTPATIENT_CLINIC_OR_DEPARTMENT_OTHER): Payer: 59 | Admitting: Nurse Practitioner

## 2017-10-04 VITALS — BP 154/96 | HR 92 | Temp 98.2°F | Resp 18 | Ht 65.0 in | Wt 117.2 lb

## 2017-10-04 DIAGNOSIS — C787 Secondary malignant neoplasm of liver and intrahepatic bile duct: Secondary | ICD-10-CM

## 2017-10-04 DIAGNOSIS — Z95828 Presence of other vascular implants and grafts: Secondary | ICD-10-CM

## 2017-10-04 DIAGNOSIS — C259 Malignant neoplasm of pancreas, unspecified: Secondary | ICD-10-CM

## 2017-10-04 DIAGNOSIS — R634 Abnormal weight loss: Secondary | ICD-10-CM

## 2017-10-04 DIAGNOSIS — K859 Acute pancreatitis without necrosis or infection, unspecified: Secondary | ICD-10-CM | POA: Diagnosis not present

## 2017-10-04 DIAGNOSIS — Z7189 Other specified counseling: Secondary | ICD-10-CM

## 2017-10-04 DIAGNOSIS — E876 Hypokalemia: Secondary | ICD-10-CM

## 2017-10-04 DIAGNOSIS — R7989 Other specified abnormal findings of blood chemistry: Secondary | ICD-10-CM

## 2017-10-04 DIAGNOSIS — H8109 Meniere's disease, unspecified ear: Secondary | ICD-10-CM

## 2017-10-04 DIAGNOSIS — C251 Malignant neoplasm of body of pancreas: Secondary | ICD-10-CM

## 2017-10-04 DIAGNOSIS — Z5111 Encounter for antineoplastic chemotherapy: Secondary | ICD-10-CM | POA: Diagnosis not present

## 2017-10-04 LAB — CBC WITH DIFFERENTIAL/PLATELET
BASOS ABS: 0.1 10*3/uL (ref 0.0–0.1)
Basophils Relative: 2 %
Eosinophils Absolute: 0.1 10*3/uL (ref 0.0–0.5)
Eosinophils Relative: 3 %
HEMATOCRIT: 30.8 % — AB (ref 34.8–46.6)
HEMOGLOBIN: 10.4 g/dL — AB (ref 11.6–15.9)
LYMPHS ABS: 0.6 10*3/uL — AB (ref 0.9–3.3)
Lymphocytes Relative: 20 %
MCH: 31 pg (ref 25.1–34.0)
MCHC: 33.6 g/dL (ref 31.5–36.0)
MCV: 92 fL (ref 79.5–101.0)
Monocytes Absolute: 0.7 10*3/uL (ref 0.1–0.9)
Monocytes Relative: 21 %
NEUTROS ABS: 1.7 10*3/uL (ref 1.5–6.5)
Neutrophils Relative %: 54 %
Platelets: 192 10*3/uL (ref 145–400)
RBC: 3.35 MIL/uL — AB (ref 3.70–5.45)
RDW: 19.5 % — ABNORMAL HIGH (ref 11.2–14.5)
WBC: 3.1 10*3/uL — AB (ref 3.9–10.3)

## 2017-10-04 LAB — COMPREHENSIVE METABOLIC PANEL WITH GFR
ALT: 62 U/L — ABNORMAL HIGH (ref 0–55)
AST: 93 U/L — ABNORMAL HIGH (ref 5–34)
Albumin: 3.4 g/dL — ABNORMAL LOW (ref 3.5–5.0)
Alkaline Phosphatase: 231 U/L — ABNORMAL HIGH (ref 40–150)
Anion gap: 12 — ABNORMAL HIGH (ref 3–11)
BUN: 12 mg/dL (ref 7–26)
CO2: 26 mmol/L (ref 22–29)
Calcium: 9.8 mg/dL (ref 8.4–10.4)
Chloride: 103 mmol/L (ref 98–109)
Creatinine, Ser: 1.72 mg/dL — ABNORMAL HIGH (ref 0.60–1.10)
GFR calc Af Amer: 38 mL/min — ABNORMAL LOW
GFR calc non Af Amer: 33 mL/min — ABNORMAL LOW
Glucose, Bld: 110 mg/dL (ref 70–140)
Potassium: 3 mmol/L — CL (ref 3.5–5.1)
Sodium: 141 mmol/L (ref 136–145)
Total Bilirubin: 0.5 mg/dL (ref 0.2–1.2)
Total Protein: 7.3 g/dL (ref 6.4–8.3)

## 2017-10-04 LAB — MAGNESIUM: MAGNESIUM: 1.6 mg/dL (ref 1.5–2.5)

## 2017-10-04 LAB — URIC ACID: Uric Acid, Serum: 5.4 mg/dL (ref 2.6–7.4)

## 2017-10-04 MED ORDER — ATROPINE SULFATE 1 MG/ML IJ SOLN
INTRAMUSCULAR | Status: AC
  Filled 2017-10-04: qty 1

## 2017-10-04 MED ORDER — ATROPINE SULFATE 1 MG/ML IJ SOLN
0.5000 mg | Freq: Once | INTRAMUSCULAR | Status: AC | PRN
  Administered 2017-10-04: 0.5 mg via INTRAVENOUS

## 2017-10-04 MED ORDER — SODIUM CHLORIDE 0.9% FLUSH
10.0000 mL | Freq: Once | INTRAVENOUS | Status: AC
  Administered 2017-10-04: 10 mL
  Filled 2017-10-04: qty 10

## 2017-10-04 MED ORDER — LEUCOVORIN CALCIUM INJECTION 350 MG
400.0000 mg/m2 | Freq: Once | INTRAVENOUS | Status: AC
  Administered 2017-10-04: 668 mg via INTRAVENOUS
  Filled 2017-10-04: qty 33.4

## 2017-10-04 MED ORDER — OXALIPLATIN CHEMO INJECTION 100 MG/20ML: 85.0000 mg/m2 | Freq: Once | INTRAVENOUS | Status: DC

## 2017-10-04 MED ORDER — IRINOTECAN HCL CHEMO INJECTION 100 MG/5ML: 180.0000 mg/m2 | Freq: Once | INTRAVENOUS | Status: DC

## 2017-10-04 MED ORDER — PALONOSETRON HCL INJECTION 0.25 MG/5ML
0.2500 mg | Freq: Once | INTRAVENOUS | Status: AC
  Administered 2017-10-04: 0.25 mg via INTRAVENOUS

## 2017-10-04 MED ORDER — DEXTROSE 5 % IV SOLN
70.0000 mg/m2 | Freq: Once | INTRAVENOUS | Status: AC
  Administered 2017-10-04: 115 mg via INTRAVENOUS
  Filled 2017-10-04: qty 20

## 2017-10-04 MED ORDER — DEXTROSE 5 % IV SOLN
Freq: Once | INTRAVENOUS | Status: AC
  Administered 2017-10-04: 12:00:00 via INTRAVENOUS

## 2017-10-04 MED ORDER — PALONOSETRON HCL INJECTION 0.25 MG/5ML
INTRAVENOUS | Status: AC
  Filled 2017-10-04: qty 5

## 2017-10-04 MED ORDER — DEXTROSE 5 % IV SOLN
160.0000 mg/m2 | Freq: Once | INTRAVENOUS | Status: AC
  Administered 2017-10-04: 260 mg via INTRAVENOUS
  Filled 2017-10-04: qty 13

## 2017-10-04 MED ORDER — SODIUM CHLORIDE 0.9 % IV SOLN
Freq: Once | INTRAVENOUS | Status: AC
  Administered 2017-10-04: 12:00:00 via INTRAVENOUS
  Filled 2017-10-04: qty 5

## 2017-10-04 MED ORDER — SODIUM CHLORIDE 0.9 % IV SOLN
2400.0000 mg/m2 | INTRAVENOUS | Status: DC
  Administered 2017-10-04: 4000 mg via INTRAVENOUS
  Filled 2017-10-04: qty 80

## 2017-10-04 NOTE — Progress Notes (Signed)
[place]  Telephone:(336) [phone] Fax:(336) [phone]  Clinic Follow up Note   Patient Care Team: [name], MD as PCP - General (Family Medicine) [name], MD as Consulting Physician (Otolaryngology) 10/04/2017  CHIEF COMPLAINT: Follow up metastatic pancreatic cancer to liver  SUMMARY OF ONCOLOGIC HISTORY: Oncology History   Cancer Staging Pancreatic cancer [facility]) Staging form: Exocrine Pancreas, AJCC 8th Edition - Clinical stage from 08/06/2017: Stage IV (cTX, cN0, pM1) - Signed by [name], MD on 08/12/2017       Pancreatic cancer ([place])   07/23/2017 Imaging    US abdomen limited RUQ 07/23/17 IMPRESSION: 1. Cholelithiasis.  No secondary signs of acute cholecystitis. 2. Multiple solid liver masses measuring up to 6.5 cm in the left lobe of the liver, evaluation for metastatic disease is recommended. These results will be called to the ordering clinician or representative by the Radiologist Assistant, and communication documented in the PACS or zVision Dashboard.      07/23/2017 Imaging    CT Abdomen W Contrast 07/23/17 IMPRESSION: 1. Widespread metastatic disease throughout the liver. No clear primary malignancy identified in the abdomen. The pelvis was not imaged. Tissue sampling recommended. 2. Probable adenopathy superior to the pancreatic tail. No evidence of pancreatic mass. 3. Suspected incidental hemangioma inferiorly in the right hepatic lobe. 4. Nonspecific nodularity in the breasts. The patient has undergone recent (03/25/2017 and 04/01/2017) mammography and ultrasound.      07/29/2017 Initial Diagnosis    Metastasis to liver of unknown origin (Morgan Dawson)      07/29/2017 PET scan    PET 07/29/17  IMPRESSION: 1. Numerous bulky liver masses are hypermetabolic compatible with malignancy. 2. Accentuated activity within or along the pancreatic tail, likely represent a primary pancreatic tumor. Consider pancreatic protocol MRI to  further work this up. 3. The peripancreatic lymph node shown above the pancreatic tail is mildly hypermetabolic favoring malignancy. 4.  Prominent stool throughout the colon favors constipation. 5. 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      08/05/2017 Pathology Results    Liver Biopsy  Diagnosis 08/05/17 Liver, needle/core biopsy - CARCINOMA. - SEE COMMENT. Microscopic Comment The malignant cells are positive for cytokeratin 7. They are negative for arginase, CDX2, cytokeratin 20, estrogen receptor, GATA-3, GCDFP, Glypican 3, Hep Par 1, Napsin A, and TTF-1. This immunohistochemical is nonspecific. Possibly primary sources include pancreatobiliary and upper gastrointestinal. Radiologic correlation is necessary. Of note, organ specific markers (GATA-3, GCDFP-breast, TTF-1, Napsin A-lung, and CDX2-colon) are negative. (JBK:ecj 08/09/2017)       Chemotherapy    PENDING FOLFIRINOX every 2 weeks starting 08/21/17       CURRENT THERAPY: FOLFIRINOX every 2 weeks starting 08/21/17   INTERVAL HISTORY: Morgan Dawson returns for follow up as scheduled prior to cycle 4 FOLFIRINOX. Last treated on 09/21/17, pump d/c on 09/23/17. She felt bad with fatigue and mild nausea with vomiting for 2 days after pump d/c, improved with compazine and zofran. She feels nausea and vomiting was improved from previous cycles. For the last 8 days she has felt really well, she traveled to [place] to visit family. She feels her appetite is improved and she is eating better, a well rounded diet of multiple food groups. Intakes 1 boost or carnation instant breakfast per day. Mild diarrhea early after treatment resolved with imodium, she did not get constipated. Last BM 1 day ago. She did have emesis this morning but felt it was r/t gagging on toothbrush, which she occasionally does. Denies abdominal pain. Cold  sensitivity tingling lasts roughly 9 days, she feels most in her throat and hands. Stiffness was improved with last  cycle, her function was better as well. She had a mild meniere's attack since last cycle, resolved with meclizine which she takes PRN. Has not been taking aldactone for meniere's. She has been taking anywhere from 10-40 mEq of potassium daily, depending on her schedule and tolerance for swallowing pills. She has no other specific complaints today.   REVIEW OF SYSTEMS:   Constitutional: Denies fevers, chills or abnormal weight loss (+) mild fatigue, lasts 2 days after pump d/c (+) improved appetite (+) unintentional weight loss Eyes: Denies blurriness of vision Ears, nose, mouth, throat, and face: Denies mucositis or sore throat Respiratory: Denies cough, dyspnea or wheezes Cardiovascular: Denies palpitation, chest discomfort or lower extremity swelling Gastrointestinal:  Denies constipation, heartburn (+) mild nausea with vomiting x2 days after pump d/c (+) diarrhea early after treatment, none in last 8 days, improved with imodium (+) gagged on toothbrush this morning Skin: Denies abnormal skin rashes Lymphatics: Denies new lymphadenopathy or easy bruising Neurological:Denies numbness, tingling or new weaknesses (+) cold sensitivity to throat lasting 9 days after treatment (+) numbness and stiffness to hands  x9 days, improved with last cycle Behavioral/Psych: Mood is stable, no new changes  All other systems were reviewed with the patient and are negative.  MEDICAL HISTORY:  Past Medical History:  Diagnosis Date  . Meniere disease 2016  . Pancreatitis     SURGICAL HISTORY: Past Surgical History:  Procedure Laterality Date  . CERVICAL ABLATION    . ENDOMETRIAL ABLATION  2011  . IR FLUORO GUIDE PORT INSERTION RIGHT  08/12/2017  . IR US GUIDE VASC ACCESS RIGHT  08/12/2017  . [place]    I have reviewed the social history and family history with the patient and they are unchanged from previous note.  ALLERGIES:  has No Known Allergies.  MEDICATIONS:  Current Outpatient  Medications  Medication Sig Dispense Refill  . bisacodyl (DULCOLAX) 5 MG EC tablet Take 5 mg by mouth daily as needed for mild constipation or moderate constipation.    . lidocaine-prilocaine (EMLA) cream Apply to affected area once 30 g 3  . loratadine (CLARITIN) 10 MG tablet Take 10 mg by mouth daily as needed for allergies.    . magic mouthwash w/lidocaine SOLN Take 10 mLs by mouth 3 (three) times daily as needed for mouth pain. Swish and spit 10 ML by mouth 3 times daily as needed for mouth pain. 240 mL 0  . meclizine (ANTIVERT) 25 MG tablet Take 25 mg by mouth 3 (three) times daily as needed for dizziness.    . ondansetron (ZOFRAN-ODT) 8 MG disintegrating tablet Take 1 tablet (8 mg total) by mouth every 8 (eight) hours as needed for nausea or vomiting. 40 tablet 2  . polyethylene glycol (MIRALAX / GLYCOLAX) packet Take 17 g by mouth daily as needed for mild constipation.    . potassium chloride (MICRO-K) 10 MEQ CR capsule TAKE 2 CAPSULES(20 MEQ) BY MOUTH TWICE DAILY FOR 5 DAYS THEN TAKE 2 CAPSULES BY MOUTH DAILY 70 capsule 0  . prochlorperazine (COMPAZINE) 10 MG tablet Take 1 tablet (10 mg total) by mouth every 6 (six) hours as needed for nausea or vomiting. 40 tablet 2  . traMADol (ULTRAM) 50 MG tablet Take 1 tablet (50 mg total) by mouth every 6 (six) hours as needed. 30 tablet 0  . spironolactone (ALDACTONE) 25 MG tablet Take 1  tablet (25 mg total) by mouth daily. (Patient not taking: Reported on 10/04/2017) 90 tablet 1   No current facility-administered medications for this visit.    Facility-Administered Medications Ordered in Other Visits  Medication Dose Route Frequency Provider Last Rate Last Dose  . atropine injection 0.5 mg  0.5 mg Intravenous Once PRN [name], MD      . fluorouracil (ADRUCIL) 4,000 mg in sodium chloride 0.9 % 70 mL chemo infusion  2,400 mg/m2 (Treatment Plan Recorded) Intravenous 1 day or 1 dose [name], MD      . irinotecan (CAMPTOSAR) 260 mg in dextrose 5 %  500 mL chemo infusion  160 mg/m2 (Treatment Plan Recorded) Intravenous Once [name], MD      . leucovorin 668 mg in dextrose 5 % 250 mL infusion  400 mg/m2 (Treatment Plan Recorded) Intravenous Once [name], MD      . oxaliplatin (ELOXATIN) 115 mg in dextrose 5 % 500 mL chemo infusion  70 mg/m2 (Treatment Plan Recorded) Intravenous Once [name], MD 262 mL/hr at 10/04/17 1341 115 mg at 10/04/17 1341    PHYSICAL EXAMINATION: ECOG PERFORMANCE STATUS: 1 - Symptomatic but completely ambulatory  Vitals:   10/04/17 1048  BP: (!) 154/96  Pulse: 92  Resp: 18  Temp: 98.2 F (36.8 C)  SpO2: 99%   Filed Weights   10/04/17 1048  Weight: 117 lb 3.2 oz (53.2 kg)    GENERAL:alert, no distress and comfortable SKIN: skin color, texture, turgor are normal, no rashes or significant lesions EYES: normal, Conjunctiva are pink and non-injected, sclera clear OROPHARYNX:no exudate, no erythema and lips, buccal mucosa, and tongue normal  NECK: supple, thyroid normal size, non-tender, without nodularity LYMPH:  no palpable cervical, supraclavicular, or axillary lymphadenopathy  LUNGS: clear to auscultation bilaterally with normal breathing effort HEART: regular rate & rhythm and no murmurs and no lower extremity edema ABDOMEN:abdomen soft, non-tender and normal bowel sounds. (+) hepatomegaly, non-tender to palpation  Musculoskeletal:no cyanosis of digits and no clubbing  NEURO: alert & oriented x 3 with fluent speech, no focal motor/sensory deficits  LABORATORY DATA:  I have reviewed the data as listed CBC Latest Ref Rng & Units 10/04/2017 09/28/2017 09/21/2017  WBC 3.9 - 10.3 K/uL 3.1(L) 4.4 28.9(H)  Hemoglobin 11.6 - 15.9 g/dL 10.4(L) 10.9(L) 11.0(L)  Hematocrit 34.8 - 46.6 % 30.8(L) 32.0(L) 34.2(L)  Platelets 145 - 400 K/uL 192 243 185     CMP Latest Ref Rng & Units 10/04/2017 09/28/2017 09/23/2017  Glucose 70 - 140 mg/dL 110 134 95  BUN 7 - 26 mg/dL 12 13 19   Creatinine 0.60 - 1.10 mg/dL  1.72(H) 1.65(H) 1.44(H)  Sodium 136 - 145 mmol/L 141 140 141  Potassium 3.5 - 5.1 mmol/L 3.0(LL) 3.3(L) 3.3(L)  Chloride 98 - 109 mmol/L 103 103 104  CO2 22 - 29 mmol/L 26 25 27   Calcium 8.4 - 10.4 mg/dL 9.8 8.8 9.0  Total Protein 6.4 - 8.3 g/dL 7.3 7.5 6.7  Total Bilirubin 0.2 - 1.2 mg/dL 0.5 0.5 0.4  Alkaline Phos 40 - 150 U/L 231(H) 300(H) 319(H)  AST 5 - 34 U/L 93(H) 94(H) 97(H)  ALT 0 - 55 U/L 62(H) 42 37   PATHOLOGY  Liver Biopsy  Diagnosis 08/05/17 Liver, needle/core biopsy - CARCINOMA. - SEE COMMENT. Microscopic Comment The malignant cells are positive for cytokeratin 7. They are negative for arginase, CDX2, cytokeratin 20, estrogen receptor, GATA-3, GCDFP, Glypican 3, Hep Par 1, Napsin A, and TTF-1. This immunohistochemical is  nonspecific. Possibly primary sources include pancreatobiliary and upper gastrointestinal. Radiologic correlation is necessary. Of note, organ specific markers (GATA-3, GCDFP-breast, TTF-1, Napsin A-lung, and CDX2-colon) are negative. (JBK:ecj 08/09/2017)   RADIOGRAPHIC STUDIES: I have personally reviewed the radiological images as listed and agreed with the findings in the report. No results found.   ASSESSMENT & PLAN: Morgan Dawson a 57 y.o.caucasian female with a history of vertigo and menieres disease, presented with epigastric discomfort, low appetite and 5 pound weight loss   1. 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 Ridolfi appears stable today. She completed cycle 3 FOLFIRINOX on 09/21/17. Her GI symptoms appear improved with last cycle. However, she continues to lose weight. Creatinine trending up as well, 1.72 today. She received dose reduced oxaliplatin to 70 mg/m2 and irinotecan 160 mg/m2 with cycle 3; labs reviewed. CBC stable; CMP with increased Cr. and elevated but stable LFTs. OK to proceed with cycle 4 FOLFIRINOX today at current doses.  -will obtain restaging CT after this cycle to evaluate  response to therapy. No contrast due to decreased renal function. Ordered today. -F/u in 2 weeks with cycle 5  2. Pancreatitis -Asymptomatic, does not need pain medication. No longer eating bland diet.   3. Nausea and vomiting -Emend was added with cycle 3, her n/v are overall improved from previous cycles. She had 2 days of mild n/v after pump d/c, managed with zofran and compazine. She will continue current supportive regimen. Will monitor.   4. Anorexia, weight loss She has been eating well rounded diet with improved appetite lately, though she continues to lose weight. I recommend she increase nutritional supplement to 2 per day. Continue f/u with dietitian.  5. Hypercalcemia -Secondary to malignancy; Improved after Zometa and IV hydration in 07/2017. Will monitor.   6. Transaminitis -Secondary to liver metastasis. Alk phos trending down; AST/ALT stable; will continue monitoring.   7. Cough, congestion -Resolved  8. Hypokalemia, elevated creatinine -She takes 10-40 mEq on a given day depending on her ability to swallow pills. K is 3.0 today. She does not have heavy GI output. I recommend she take 20 mEq daily consistently.  -Creatinine continues to increase; she appears adequately hydrated. Could be related to chemotherapy. Will check mag to make sure it is normal. Will obtain uric acid to r/o tumor lysis.  -will check labs closely, add on with pump d/c on day 3  9. Meniere's disease -She had recent meniere's flare with dizziness since last cycle. She is not currently on daily treatment; takes meclizine PRN. Dr. Burr Medico previously discussed with ENT, she was prescribed aldactone. She has been holding due to increased creatinine. Given her decreased renal function, she can start aldactone 1 tablet 25 mg every other day. I reviewed this is potassium-sparing and may help her hypokalemia as well. Will monitor labs closely.  PLAN: -Labs reviewed, proceed with cycle 4 FOLFIRINOX today at  current doses -CT CAP WO contrast prior to next cycle, ordered today -Add on Mg, uric acid today; Check labs on day 3 with pump d/c -Oral potassium 20 mEq daily for hypokalemia -Begin aldactone every other day for meniere's disease -Return in 2 weeks with f/u and cycle 5  Orders Placed This Encounter  Procedures  . Magnesium    Standing Status:   Future    Number of Occurrences:   1    Standing Expiration Date:   10/04/2018  . Uric acid    Standing Status:   Future  Number of Occurrences:   1    Standing Expiration Date:   10/04/2018   All questions were answered. The patient knows to call the clinic with any problems, questions or concerns. No barriers to learning was detected. I spent 20 minutes counseling the patient face to face. The total time spent in the appointment was 25 minutes and more than 50% was on counseling and review of test results     Alla Feeling, NP 10/04/17

## 2017-10-04 NOTE — Progress Notes (Signed)
Per Cira Rue, Pa, ok to treat pt with K of 3.0, Crt of 1.72, and AST of 93

## 2017-10-04 NOTE — Patient Instructions (Signed)
Cancer Center Discharge Instructions for Patients Receiving Chemotherapy  Today you received the following chemotherapy agents Oxaliplatin, Leucovorin, Irinotecan, and 5FU  To help prevent nausea and vomiting after your treatment, we encourage you to take your nausea medication as directed   If you develop nausea and vomiting that is not controlled by your nausea medication, call the clinic.   BELOW ARE SYMPTOMS THAT SHOULD BE REPORTED IMMEDIATELY:  *FEVER GREATER THAN 100.5 F  *CHILLS WITH OR WITHOUT FEVER  NAUSEA AND VOMITING THAT IS NOT CONTROLLED WITH YOUR NAUSEA MEDICATION  *UNUSUAL SHORTNESS OF BREATH  *UNUSUAL BRUISING OR BLEEDING  TENDERNESS IN MOUTH AND THROAT WITH OR WITHOUT PRESENCE OF ULCERS  *URINARY PROBLEMS  *BOWEL PROBLEMS  UNUSUAL RASH Items with * indicate a potential emergency and should be followed up as soon as possible.  Feel free to call the clinic should you have any questions or concerns. The clinic phone number is [phone].  Please show the CHEMO ALERT CARD at check-in to the Emergency Department and triage nurse.   

## 2017-10-05 ENCOUNTER — Telehealth: Payer: Self-pay | Admitting: Nurse Practitioner

## 2017-10-05 NOTE — Telephone Encounter (Signed)
Scheduled appt per 2/11 los - Patient to get an updated schedule next visit.  

## 2017-10-06 ENCOUNTER — Inpatient Hospital Stay: Payer: 59

## 2017-10-06 VITALS — BP 140/90 | HR 80 | Temp 98.2°F | Resp 18

## 2017-10-06 DIAGNOSIS — C251 Malignant neoplasm of body of pancreas: Secondary | ICD-10-CM

## 2017-10-06 DIAGNOSIS — C787 Secondary malignant neoplasm of liver and intrahepatic bile duct: Principal | ICD-10-CM

## 2017-10-06 DIAGNOSIS — Z5111 Encounter for antineoplastic chemotherapy: Secondary | ICD-10-CM | POA: Diagnosis not present

## 2017-10-06 DIAGNOSIS — C259 Malignant neoplasm of pancreas, unspecified: Secondary | ICD-10-CM

## 2017-10-06 DIAGNOSIS — Z7189 Other specified counseling: Secondary | ICD-10-CM

## 2017-10-06 LAB — COMPREHENSIVE METABOLIC PANEL
ALBUMIN: 3.3 g/dL — AB (ref 3.5–5.0)
ALK PHOS: 214 U/L — AB (ref 40–150)
ALT: 70 U/L — AB (ref 0–55)
AST: 106 U/L — AB (ref 5–34)
Anion gap: 12 — ABNORMAL HIGH (ref 3–11)
BILIRUBIN TOTAL: 0.5 mg/dL (ref 0.2–1.2)
BUN: 17 mg/dL (ref 7–26)
CALCIUM: 9.4 mg/dL (ref 8.4–10.4)
CO2: 28 mmol/L (ref 22–29)
Chloride: 100 mmol/L (ref 98–109)
Creatinine, Ser: 1.77 mg/dL — ABNORMAL HIGH (ref 0.60–1.10)
GFR calc Af Amer: 36 mL/min — ABNORMAL LOW (ref >60)
GFR calc non Af Amer: 31 mL/min — ABNORMAL LOW (ref >60)
GLUCOSE: 110 mg/dL (ref 70–140)
Potassium: 3.1 mmol/L — ABNORMAL LOW (ref 3.5–5.1)
SODIUM: 140 mmol/L (ref 136–145)
TOTAL PROTEIN: 7.2 g/dL (ref 6.4–8.3)

## 2017-10-06 LAB — CBC WITH DIFFERENTIAL/PLATELET
BASOS ABS: 0 10*3/uL (ref 0.0–0.1)
BASOS PCT: 1 %
Eosinophils Absolute: 0.2 10*3/uL (ref 0.0–0.5)
Eosinophils Relative: 10 %
HEMATOCRIT: 32.9 % — AB (ref 34.8–46.6)
Hemoglobin: 10.6 g/dL — ABNORMAL LOW (ref 11.6–15.9)
Lymphocytes Relative: 27 %
Lymphs Abs: 0.6 10*3/uL — ABNORMAL LOW (ref 0.9–3.3)
MCH: 30.8 pg (ref 25.1–34.0)
MCHC: 32.2 g/dL (ref 31.5–36.0)
MCV: 95.6 fL (ref 79.5–101.0)
MONOS PCT: 10 %
Monocytes Absolute: 0.2 10*3/uL (ref 0.1–0.9)
NEUTROS ABS: 1.2 10*3/uL — AB (ref 1.5–6.5)
Neutrophils Relative %: 52 %
Platelets: 217 10*3/uL (ref 145–400)
RBC: 3.44 MIL/uL — AB (ref 3.70–5.45)
RDW: 20 % — ABNORMAL HIGH (ref 11.2–14.5)
WBC: 2.2 10*3/uL — ABNORMAL LOW (ref 3.9–10.3)

## 2017-10-06 MED ORDER — SODIUM CHLORIDE 0.9% FLUSH
10.0000 mL | INTRAVENOUS | Status: DC | PRN
  Filled 2017-10-06: qty 10

## 2017-10-06 MED ORDER — HEPARIN SOD (PORK) LOCK FLUSH 100 UNIT/ML IV SOLN
500.0000 [IU] | Freq: Once | INTRAVENOUS | Status: DC | PRN
  Filled 2017-10-06: qty 5

## 2017-10-07 ENCOUNTER — Telehealth: Payer: Self-pay | Admitting: Hematology

## 2017-10-07 NOTE — Telephone Encounter (Signed)
10/07/17 successfully faxed FMLA to [employer] @ [phone] @ 11:02 am.  Mailed copy to patient's home for personal records as requested.

## 2017-10-14 ENCOUNTER — Ambulatory Visit (HOSPITAL_COMMUNITY)
Admission: RE | Admit: 2017-10-14 | Discharge: 2017-10-14 | Disposition: A | Discharge status: Home / Self Care | Payer: 59 | Source: Ambulatory Visit | Attending: Nurse Practitioner | Admitting: Nurse Practitioner

## 2017-10-14 DIAGNOSIS — C787 Secondary malignant neoplasm of liver and intrahepatic bile duct: Secondary | ICD-10-CM | POA: Diagnosis present

## 2017-10-14 DIAGNOSIS — I313 Pericardial effusion (noninflammatory): Secondary | ICD-10-CM | POA: Insufficient documentation

## 2017-10-14 DIAGNOSIS — Z5111 Encounter for antineoplastic chemotherapy: Secondary | ICD-10-CM | POA: Diagnosis not present

## 2017-10-14 DIAGNOSIS — C259 Malignant neoplasm of pancreas, unspecified: Secondary | ICD-10-CM | POA: Diagnosis not present

## 2017-10-14 DIAGNOSIS — K8020 Calculus of gallbladder without cholecystitis without obstruction: Secondary | ICD-10-CM | POA: Diagnosis not present

## 2017-10-17 NOTE — Progress Notes (Signed)
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 Cholelithiasis.  No secondary signs of acute cholecystitis. 2. Multiple solid liver masses measuring up to 6.5 cm in the left lobe of the liver, evaluation for metastatic disease is recommended. These results will be called to the ordering clinician or representative by the Radiologist Assistant, and communication documented in the PACS or zVision Dashboard.      07/23/2017 Imaging    CT Abdomen W Contrast 07/23/17 IMPRESSION: 1. Widespread metastatic disease throughout the liver. No clear primary malignancy identified in the abdomen. The pelvis was not imaged. Tissue sampling recommended. 2. Probable adenopathy superior to the pancreatic tail. No evidence of pancreatic mass. 3. Suspected incidental hemangioma inferiorly in the right hepatic lobe. 4. Nonspecific nodularity in the breasts. The patient has undergone recent (03/25/2017 and 04/01/2017) mammography and ultrasound.      07/29/2017 Initial Diagnosis    Metastasis to liver of unknown origin ([place])      07/29/2017 PET scan    PET 07/29/17  IMPRESSION: 1. Numerous bulky liver masses are hypermetabolic compatible with malignancy. 2. Accentuated activity within or along the pancreatic tail, likely represent a primary pancreatic tumor. Consider pancreatic protocol MRI to further work this  up. 3. The peripancreatic lymph node shown above the pancreatic tail is mildly hypermetabolic favoring malignancy. 4.  Prominent stool throughout the colon favors constipation. 5. 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      08/05/2017 Pathology Results    Liver Biopsy  Diagnosis 08/05/17 Liver, needle/core biopsy - CARCINOMA. - SEE COMMENT. Microscopic Comment The malignant cells are positive for cytokeratin 7. They are negative for arginase, CDX2, cytokeratin 20, estrogen receptor, GATA-3, GCDFP, Glypican 3, Hep Par 1, Napsin A, and TTF-1. This immunohistochemical is nonspecific. Possibly primary sources include pancreatobiliary and upper gastrointestinal. Radiologic correlation is necessary. Of note, organ specific markers (GATA-3, GCDFP-breast, TTF-1, Napsin A-lung, and CDX2-colon) are negative. (JBK:ecj 08/09/2017)      08/21/2017 -  Chemotherapy    FOLFIRINOX every 2 weeks starting 08/21/17        10/14/2017 Imaging    CT CAP WO Contrast 10/14/17 IMPRESSION: Evidence of known numerous liver metastases without significant interval change. No other evidence of metastatic disease within the chest, abdomen or pelvis. Cholelithiasis. Tiny pericardial effusion.        HISTORY OF PRESENTING ILLNESS: 07/30/17  Morgan Dawson 57 y.o. 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 The patient was referred by her PCP Dr. [name]. The patient presents to the clinic today accompanied by husband.  Today the patient reports the weekend before Thanksgiving she had abdominal issues with bloating and cramps. These symptoms worsened after she would eat. This persisted past Thanksgiving so she went to her PCP.  She presented to her PCP on 07/20/17 for upper abdominal pain, nausea and bloating. She had lab work up and  RUQ [place] on 07/23/17. Results showed elevated LFTs (alk phos 227, AST 191, ALT 275) and US showed multiple masses in her liver and  pancreatitis. She was put on a bland die due to her pancreatitis. CT AP in 07/23/17 showed diffuse metastatic disease in her liver with no clear primary. She had a PET scan that shows possible metastasis to peripancreatic lymph node along with her known liver masses.   Today she notes she has focused soreness in RUQ. She feels nauseous and has vomited 3 times total in the past 3 weeks. She has lost weight, initially purposefully. She has lost 5-10 pounds. She has been constipated  lately. She takes dulcolax to help. She denies dark stool or GI bleeding. No hematemesis. She does feel more fatigued. She was relatively active 4-5 days a week before last month. She has not needed medication as she feels she has more discomfort (1-2/10). She has not been exercising because she is afraid of making things worse. Her appetite is low from her discomfort and nausea. She notes multiple tender knots on her lower legs that appeared 5 nights ago. There is no itching. She notes she will follow up with Dr. Paulita Fujita this month for her pancreatitis.   Her last mammogram at the [facility] was 04/01/17 and mostly benign. She was recommended to repeat in 6 months. She notes she frequently has cyst in her breast which were benign before.  Socially she is not working. She usually is active in the gym 3-4 days a week. She has 2 children (son and daughter) at 68yo and 77yo.   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 Her last episode of vertigo was a few months ago. She takes a diuretic and a low salt diet. She has jaw surgery for TMJ issues. She had endometrial ablation due to menorrhagia in 2011. She has only spotted since, no full period. Patient had a colonoscopy in 2014 with one benign polyp removed. Her father had colon cancer in his 84s. Her maternal grandfather had colon cancer in his 90s. Paternal aunt had lymphoma.     CURRENT THERAPY: FOLFIRINOX every 2 weeks started 08/21/17    INTERVAL HISTORY Morgan Dawson is here for a follow up and Cycle 5 FOLFIRINOX. She presents to the infusion room today accompanied by her husband. She reports that she is doing well overall considering her extensive treatment. She notes to have cold sensation in her hands that does resolve. She states she gets cramps in her calfs and forearms. She reports she got a headache and is requesting to take Tylenol. She states she is having a burning sensation when urinating for the past 5 days. No allergies to any antibiotics.   On review of systems, pt denies fever, hematuria or any other complaints at this time. Pertinent positives are listed and detailed within the above HPI.    MEDICAL HISTORY:  Past Medical History:  Diagnosis Date  . Meniere disease 2016  . Pancreatitis     SURGICAL HISTORY: Past Surgical History:  Procedure Laterality Date  . CERVICAL ABLATION    . ENDOMETRIAL ABLATION  2011  . IR FLUORO GUIDE PORT INSERTION RIGHT  08/12/2017  . IR US GUIDE VASC ACCESS RIGHT  08/12/2017  . MANDIBLE SURGERY  1999    SOCIAL HISTORY: Social History   Socioeconomic History  . Marital status: Married    Spouse name: Not on file  . Number of children: Not on file  . Years  of education: Not on file  . Highest education level: Not on file  Social Needs  . Financial resource strain: Not on file  . Food insecurity - worry: Not on file  . Food insecurity - inability: Not on file  . Transportation needs - medical: Not on file  . Transportation needs - non-medical: Not on file  Occupational History  . Not on file  Tobacco Use  . Smoking status: Never Smoker  . Smokeless tobacco: Never Used  Substance and Sexual Activity  . Alcohol use: No    Frequency: Never  . Drug use: No  . Sexual activity: Not on file  Other Topics Concern  . Not on file  Social History Narrative  . Not on file    FAMILY HISTORY: Family History  Problem Relation Age of Onset  . Colon cancer Father 27       currently 39  .  Colon cancer Maternal Grandfather   . Cancer Cousin        breast cancer   . Thyroid cancer Mother 58       facial radiation for acne as teen  . Lymphoma Paternal Aunt 15       deceased 35    ALLERGIES:  has No Known Allergies.  MEDICATIONS:  Current Outpatient Medications  Medication Sig Dispense Refill  . bisacodyl (DULCOLAX) 5 MG EC tablet Take 5 mg by mouth daily as needed for mild constipation or moderate constipation.    . ciprofloxacin (CIPRO) 250 MG tablet Take 1 tablet (250 mg total) by mouth 2 (two) times daily. 6 tablet 0  . lidocaine-prilocaine (EMLA) cream Apply to affected area once 30 g 3  . loratadine (CLARITIN) 10 MG tablet Take 10 mg by mouth daily as needed for allergies.    . magic mouthwash w/lidocaine SOLN Take 10 mLs by mouth 3 (three) times daily as needed for mouth pain. Swish and spit 10 ML by mouth 3 times daily as needed for mouth pain. 240 mL 0  . meclizine (ANTIVERT) 25 MG tablet Take 25 mg by mouth 3 (three) times daily as needed for dizziness.    . ondansetron (ZOFRAN-ODT) 8 MG disintegrating tablet Take 1 tablet (8 mg total) by mouth every 8 (eight) hours as needed for nausea or vomiting. 40 tablet 2  . polyethylene glycol (MIRALAX / GLYCOLAX) packet Take 17 g by mouth daily as needed for mild constipation.    . potassium chloride (MICRO-K) 10 MEQ CR capsule TAKE 2 CAPSULES(20 MEQ) BY MOUTH TWICE DAILY FOR 5 DAYS THEN TAKE 2 CAPSULES BY MOUTH DAILY 70 capsule 0  . prochlorperazine (COMPAZINE) 10 MG tablet Take 1 tablet (10 mg total) by mouth every 6 (six) hours as needed for nausea or vomiting. 40 tablet 2  . spironolactone (ALDACTONE) 25 MG tablet Take 1 tablet (25 mg total) by mouth daily. (Patient not taking: Reported on 10/04/2017) 90 tablet 1  . traMADol (ULTRAM) 50 MG tablet Take 1 tablet (50 mg total) by mouth every 6 (six) hours as needed. 30 tablet 0   No current facility-administered medications for this visit.    Facility-Administered  Medications Ordered in Other Visits  Medication Dose Route Frequency Provider Last Rate Last Dose  . fluorouracil (ADRUCIL) 4,000 mg in sodium chloride 0.9 % 70 mL chemo infusion  2,400 mg/m2 (Treatment Plan Recorded) Intravenous 1 day or 1 dose [name], MD   4,000 mg at 10/18/17 1813    REVIEW OF SYSTEMS:   Constitutional: Denies  fevers, chills or abnormal night sweats (+) fatigue  (+) low appetite (+) weight loss Eyes: Denies blurriness of vision, double vision or watery eyes Ears, nose, mouth, throat, and face: Denies mucositis or sore throat Respiratory: Denies cough, dyspnea or wheezes Cardiovascular: Denies palpitation, chest discomfort or lower extremity swelling Gastrointestinal:  Denies nausea, vomitting Skin: Denies abnormal skin rashes (+) tender knots down lower legs bilaterally Lymphatics: Denies new lymphadenopathy or easy bruising Neurological:Denies numbness, tingling or new weaknesses Behavioral/Psych: Mood is stable, no new changes  All other systems were reviewed with the patient and are negative.  PHYSICAL EXAMINATION:  ECOG PERFORMANCE STATUS: 1 - Symptomatic but completely ambulatory Blood pressure 154/100 (repeated 130/84), heart rate 84, respiratory rate 18, temperature 36.6, pulse ox 99% GENERAL:alert, no distress and comfortable SKIN: skin color, texture, turgor are normal, no rashes or significant lesions EYES: normal, conjunctiva are pink and non-injected, sclera clear OROPHARYNX:no exudate, no erythema and lips, buccal mucosa, and tongue normal  NECK: supple, thyroid normal size, non-tender, without nodularity LYMPH:  no palpable lymphadenopathy in the cervical, axillary or inguinal LUNGS: clear to auscultation and percussion with normal breathing effort HEART: regular rate & rhythm and no murmurs and no lower extremity edema ABDOMEN:abdomen soft, normal bowel sounds (+) hepatomegaly about 3cm below ribcage with tenderness on palpation  Musculoskeletal:no  cyanosis of digits and no clubbing  PSYCH: alert & oriented x 3 with fluent speech NEURO: no focal motor/sensory deficits  LABORATORY DATA:  I have reviewed the data as listed CBC Latest Ref Rng & Units 10/18/2017 10/06/2017 10/04/2017  WBC 3.9 - 10.3 K/uL 3.0(L) 2.2(L) 3.1(L)  Hemoglobin 11.6 - 15.9 g/dL 10.6(L) 10.6(L) 10.4(L)  Hematocrit 34.8 - 46.6 % 31.5(L) 32.9(L) 30.8(L)  Platelets 145 - 400 K/uL 191 217 192    CMP Latest Ref Rng & Units 10/18/2017 10/06/2017 10/04/2017  Glucose 70 - 140 mg/dL 106 110 110  BUN 7 - 26 mg/dL 14 17 12   Creatinine 0.60 - 1.10 mg/dL 1.97(H) 1.77(H) 1.72(H)  Sodium 136 - 145 mmol/L 140 140 141  Potassium 3.5 - 5.1 mmol/L 3.2(L) 3.1(L) 3.0(LL)  Chloride 98 - 109 mmol/L 103 100 103  CO2 22 - 29 mmol/L 26 28 26   Calcium 8.4 - 10.4 mg/dL 9.9 9.4 9.8  Total Protein 6.4 - 8.3 g/dL 7.4 7.2 7.3  Total Bilirubin 0.2 - 1.2 mg/dL 0.6 0.5 0.5  Alkaline Phos 40 - 150 U/L 215(H) 214(H) 231(H)  AST 5 - 34 U/L 67(H) 106(H) 93(H)  ALT 0 - 55 U/L 47 70(H) 62(H)    PATHOLOGY  Liver Biopsy  Diagnosis 08/05/17 Liver, needle/core biopsy - CARCINOMA. - SEE COMMENT. Microscopic Comment The malignant cells are positive for cytokeratin 7. They are negative for arginase, CDX2, cytokeratin 20, estrogen receptor, GATA-3, GCDFP, Glypican 3, Hep Par 1, Napsin A, and TTF-1. This immunohistochemical is nonspecific. Possibly primary sources include pancreatobiliary and upper gastrointestinal. Radiologic correlation is necessary. Of note, organ specific markers (GATA-3, GCDFP-breast, TTF-1, Napsin A-lung, and CDX2-colon) are negative. (JBK:ecj 08/09/2017)   Diagnosis 03/07/13 Surgical, cecum, polyp -TUBULAR ADENOMA -NEGATIVE FOR HIGH GRADE DYSPLASIA   PROCEDURES  Colonoscopy by Dr. Paulita Fujita 03/07/13 IMPRESSION:  -One 5 m polyp in the cecum. Resected and retrieved -The distal rectum and anal verge are normal on retroflexion view.  -The examination was otherwise normal.     RADIOGRAPHIC STUDIES: I have personally reviewed the radiological images as listed and agreed with the findings in the report. Ct Abdomen Pelvis Wo Contrast  Result Date:  10/14/2017 CLINICAL DATA:  Restaging for pancreatic cancer with liver metastases post 4 cycles of chemotherapy. EXAM: CT CHEST, ABDOMEN AND PELVIS WITHOUT CONTRAST TECHNIQUE: Multidetector CT imaging of the chest, abdomen and pelvis was performed following the standard protocol without IV contrast. COMPARISON:  07/23/2017 and PET-CT 07/29/2017 FINDINGS: Exam limited due to lack of intravenous contrast. CT CHEST FINDINGS Cardiovascular: Right IJ Port-A-Cath with tip in the cavoatrial junction. Heart is normal size. Tiny amount of pericardial fluid. Remaining vascular structures are within normal. Mediastinum/Nodes: No mediastinal or hilar adenopathy. Subcentimeter hypodense nodule over the right lobe of the thyroid unchanged. Lungs/Pleura: Lungs are adequately inflated without focal airspace consolidation or effusion. Calcified 2 mm granuloma over the right upper lobe. No other pulmonary nodules identified. Airways are normal. Musculoskeletal: Mild degenerate change of the spine. CT ABDOMEN PELVIS FINDINGS Hepatobiliary: Numerous known liver metastases without significant interval change. Mild cholelithiasis. Biliary tree is within normal. Pancreas: Unremarkable. Spleen: Within normal. Adrenals/Urinary Tract: Adrenal glands are normal. Kidneys normal size without hydronephrosis or nephrolithiasis. Ureters and bladder are normal. Stomach/Bowel: Stomach and small bowel are within normal. Appendix is unremarkable. Colon is within normal. Vascular/Lymphatic: Vascular structures are within normal. No significant adenopathy. Reproductive: Within normal. Other: No free fluid or focal inflammatory change. Musculoskeletal: Within normal. IMPRESSION: Evidence of known numerous liver metastases without significant interval change. No other evidence  of metastatic disease within the chest, abdomen or pelvis. Cholelithiasis. Tiny pericardial effusion. Electronically Signed   By: Marin Olp M.D.   On: 10/14/2017 16:45   Ct Chest Wo Contrast  Result Date: 10/14/2017 CLINICAL DATA:  Restaging for pancreatic cancer with liver metastases post 4 cycles of chemotherapy. EXAM: CT CHEST, ABDOMEN AND PELVIS WITHOUT CONTRAST TECHNIQUE: Multidetector CT imaging of the chest, abdomen and pelvis was performed following the standard protocol without IV contrast. COMPARISON:  07/23/2017 and PET-CT 07/29/2017 FINDINGS: Exam limited due to lack of intravenous contrast. CT CHEST FINDINGS Cardiovascular: Right IJ Port-A-Cath with tip in the cavoatrial junction. Heart is normal size. Tiny amount of pericardial fluid. Remaining vascular structures are within normal. Mediastinum/Nodes: No mediastinal or hilar adenopathy. Subcentimeter hypodense nodule over the right lobe of the thyroid unchanged. Lungs/Pleura: Lungs are adequately inflated without focal airspace consolidation or effusion. Calcified 2 mm granuloma over the right upper lobe. No other pulmonary nodules identified. Airways are normal. Musculoskeletal: Mild degenerate change of the spine. CT ABDOMEN PELVIS FINDINGS Hepatobiliary: Numerous known liver metastases without significant interval change. Mild cholelithiasis. Biliary tree is within normal. Pancreas: Unremarkable. Spleen: Within normal. Adrenals/Urinary Tract: Adrenal glands are normal. Kidneys normal size without hydronephrosis or nephrolithiasis. Ureters and bladder are normal. Stomach/Bowel: Stomach and small bowel are within normal. Appendix is unremarkable. Colon is within normal. Vascular/Lymphatic: Vascular structures are within normal. No significant adenopathy. Reproductive: Within normal. Other: No free fluid or focal inflammatory change. Musculoskeletal: Within normal. IMPRESSION: Evidence of known numerous liver metastases without significant  interval change. No other evidence of metastatic disease within the chest, abdomen or pelvis. Cholelithiasis. Tiny pericardial effusion. Electronically Signed   By: Marin Olp M.D.   On: 10/14/2017 16:45   CT CAP WO Contrast 10/14/17 IMPRESSION: Evidence of known numerous liver metastases without significant interval change. No other evidence of metastatic disease within the chest, abdomen or pelvis. Cholelithiasis. Tiny pericardial effusion.   ASSESSMENT & PLAN:  Morgan Dawson is a 57 y.o. caucasian female with a history of vertigo and menieres disease, presented with epigastric discomfort, low appetite and 5 pound weight loss  1. 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  07/29/17 PET scan shows diffuse metastatic disease in her enlarged liver.  This is most consistent with diffuse liver metastasis.  -Her liver biopsy confirmed metastatic adenocarcinoma, most consistent with biliary or pancreatic primary this was discussed with patient.  -After further reviewing her 07/29/17 PET in the GI Tumor Board, the initial uptake in the duodenum is felt to be in the pancrease tail, which represents the primary tumor.  The consensus from GI tumor board is this is most consistent with metastatic pancreatic adenocarcinoma to liver.  We felt she does not need further imaging or EUS to confirm the primary site.  -Given her metastatic stage IV disease, her metastatic cancer is unfortunately not curable, and the goal of therapy is palliative, to prolong her life and improve her quality of life.  -Goal of therapy is to control the disease.  -I will set up genetic referral to see if she has BRCA mutation. If positive then she would be a  candidate for PARP inhibitor  --she has started first line FOLFIRINOX on 08/21/17, tolerated first cycle well overall  -She has been tolerating  chemotherapy moderately well, with some nausea, cold sensitivity, diarrhea, and fatigue, she has developed slightly worsening renal function, has received IV fluids intermittent -CT CAP WO Contrast from 10/14/17 reveals evidence of known numerous liver metastases without significant interval change. No other evidence of metastatic disease within the chest, abdomen or pelvis. I discussed results with pt. due to her chronic kidney disease, she is not able to receive IV contrast, which limits the quality of CT,  I may consider PET scan in the future.  -Labs reviewed, her creatinine has slightly increased today, I will reduce irinotecan to 140 mg/m2 today (10/18/17) continue with cycle 5 of FOLFIRNOX and IVF  -F/u in 2 weeks    2. Pancreatitis -Found by 07/23/17 CT scan, likely related to her cancer.  -On bland diet -I previously suggested Prilosec and to continue small meals with bland diet. If needed I can give pain medication. -Abdominal pain is mild, no need for pain medication at this time. She was taking tylenol up to TID.   3. Nausea and Vomiting  -She has had nausea since her cancer diagnosis, she knows to use Zofran and Compazine alternatively for nausea as needed off chemo -She had severe nausea and vomiting towards the end of the chemo infusion cycle 2 -Emend was added with cycle 3, her n/v are overall improved from previous cycles. She had 2 days of mild n/v after pump d/c, managed with zofran and compazine. She will continue current supportive regimen. Will monitor.   4. Anorexia, weight loss -She has anxiety about eating due to emesis.  -I previously encouraged her to eat more small meals with bland or liquid diet. She can increase her intake of ensure boost -Follow-up with dietitian -weight stable lately   5. Hypercalcemia -Secondary to her underlying malignancy -Received Zometa and IV hydration in December 2018, hypercalcemia has resolved now. -Okay to restart dairy products, she  is off calcium supplement. -She has cramps as of late, her Ca is 9.9 today, okay to restart calcium supplement for leg cramps. She can also take magnesium.  6.  Transaminitis -Due to her underlying liver metastasis -Improved since she started chemo -Monitoring  7. Goal of care discussion  -We previously discussed the  incurable nature of her cancer, and the overall poor prognosis, especially if she does not have good response to chemotherapy or progress on chemo -The patient understands the goal of care is palliative. -I previously recommended DNR/DNI, she will think about it   8. Hypokalemia, CKD stage III -She takes 10-40 mEq on a given day depending on her ability to swallow pills. K is 3.0 10/04/17. She does not have heavy GI output. I recommend she take 20 mEq daily consistently.  -Creatinine continues to increase; she appears adequately hydrated. Could be related to chemotherapy. Will check mag to make sure it is normal. Will obtain uric acid to r/o tumor lysis.  -will check labs closely, add on with pump d/c on day 3 -her Cr is 1.97 today (10/18/17), she will get IVF, her K is 3.2, She can increase to 3 potassium supplements for the next 3 days.  -if Cr remains to be high, I will stop her spironolactone   9. Meniere's disease -She had recent meniere's flare with dizziness since last cycle. She is not currently on daily treatment; takes meclizine PRN. I previously discussed with ENT, she was prescribed aldactone. She has been holding due to increased creatinine. Given her decreased renal function, she can start aldactone 1 tablet 25 mg every other day. I reviewed this is potassium-sparing and may help her hypokalemia as well. Will monitor labs closely.  10. UTI -She has developed mild dysuria lately, urine was positive for WBC -I will call in Cipro 250 mg twice daily for 3 days, dose reduced due to her renal issues -We will follow-up her urine culture result  PLAN:  -Scan results  reviewed, stable disease  -labs reviewed, proceed with cycle 5 FOLFIRINOX today with reduced dose of Irinotecan at 140 mg/m2 -cipro for UTI  -Increase K supplement to TID for 3 days, then continue at BID -She can restart Ca supplement to help with cramps -Lab and F/u in 2 weeks for next cycle  -resent genetic referral    No orders of the defined types were placed in this encounter.   All questions were answered. The patient knows to call the clinic with any problems, questions or concerns. I spent 20 minutes counseling the patient face to face. The total time spent in the appointment was 25 minutes and more than 50% was on counseling.   This document serves as a record of services personally performed by [name], MD. It was created on her behalf by Theresia Bough, a trained medical scribe. The creation of this record is based on the scribe's personal observations and the provider's statements to them.   I have reviewed the above documentation for accuracy and completeness, and I agree with the above.    [name], MD 10/18/2017 6:27 PM

## 2017-10-18 ENCOUNTER — Inpatient Hospital Stay: Payer: 59 | Admitting: Nutrition

## 2017-10-18 ENCOUNTER — Inpatient Hospital Stay: Payer: 59

## 2017-10-18 ENCOUNTER — Encounter: Payer: Self-pay | Admitting: Hematology

## 2017-10-18 ENCOUNTER — Inpatient Hospital Stay (HOSPITAL_BASED_OUTPATIENT_CLINIC_OR_DEPARTMENT_OTHER): Payer: 59 | Admitting: Hematology

## 2017-10-18 ENCOUNTER — Other Ambulatory Visit: Payer: Self-pay | Admitting: *Deleted

## 2017-10-18 VITALS — BP 130/84 | HR 84 | Temp 97.8°F | Resp 17 | Ht 65.0 in | Wt 116.5 lb

## 2017-10-18 DIAGNOSIS — K859 Acute pancreatitis without necrosis or infection, unspecified: Secondary | ICD-10-CM

## 2017-10-18 DIAGNOSIS — Z7189 Other specified counseling: Secondary | ICD-10-CM

## 2017-10-18 DIAGNOSIS — E876 Hypokalemia: Secondary | ICD-10-CM

## 2017-10-18 DIAGNOSIS — C787 Secondary malignant neoplasm of liver and intrahepatic bile duct: Principal | ICD-10-CM

## 2017-10-18 DIAGNOSIS — N183 Chronic kidney disease, stage 3 (moderate): Secondary | ICD-10-CM | POA: Diagnosis not present

## 2017-10-18 DIAGNOSIS — H8109 Meniere's disease, unspecified ear: Secondary | ICD-10-CM | POA: Diagnosis not present

## 2017-10-18 DIAGNOSIS — C259 Malignant neoplasm of pancreas, unspecified: Secondary | ICD-10-CM | POA: Diagnosis not present

## 2017-10-18 DIAGNOSIS — R7989 Other specified abnormal findings of blood chemistry: Secondary | ICD-10-CM | POA: Diagnosis not present

## 2017-10-18 DIAGNOSIS — R630 Anorexia: Secondary | ICD-10-CM

## 2017-10-18 DIAGNOSIS — C251 Malignant neoplasm of body of pancreas: Secondary | ICD-10-CM

## 2017-10-18 DIAGNOSIS — Z95828 Presence of other vascular implants and grafts: Secondary | ICD-10-CM

## 2017-10-18 DIAGNOSIS — R112 Nausea with vomiting, unspecified: Secondary | ICD-10-CM | POA: Diagnosis not present

## 2017-10-18 DIAGNOSIS — Z5111 Encounter for antineoplastic chemotherapy: Secondary | ICD-10-CM | POA: Diagnosis not present

## 2017-10-18 DIAGNOSIS — R634 Abnormal weight loss: Secondary | ICD-10-CM | POA: Diagnosis not present

## 2017-10-18 DIAGNOSIS — N390 Urinary tract infection, site not specified: Secondary | ICD-10-CM

## 2017-10-18 LAB — COMPREHENSIVE METABOLIC PANEL
ALT: 47 U/L (ref 0–55)
ANION GAP: 11 (ref 3–11)
AST: 67 U/L — ABNORMAL HIGH (ref 5–34)
Albumin: 3.4 g/dL — ABNORMAL LOW (ref 3.5–5.0)
Alkaline Phosphatase: 215 U/L — ABNORMAL HIGH (ref 40–150)
BUN: 14 mg/dL (ref 7–26)
CHLORIDE: 103 mmol/L (ref 98–109)
CO2: 26 mmol/L (ref 22–29)
Calcium: 9.9 mg/dL (ref 8.4–10.4)
Creatinine, Ser: 1.97 mg/dL — ABNORMAL HIGH (ref 0.60–1.10)
GFR calc non Af Amer: 28 mL/min — ABNORMAL LOW (ref >60)
GFR, EST AFRICAN AMERICAN: 32 mL/min — AB (ref >60)
Glucose, Bld: 106 mg/dL (ref 70–140)
Potassium: 3.2 mmol/L — ABNORMAL LOW (ref 3.5–5.1)
SODIUM: 140 mmol/L (ref 136–145)
Total Bilirubin: 0.6 mg/dL (ref 0.2–1.2)
Total Protein: 7.4 g/dL (ref 6.4–8.3)

## 2017-10-18 LAB — CBC WITH DIFFERENTIAL/PLATELET
Basophils Absolute: 0.1 10*3/uL (ref 0.0–0.1)
Basophils Relative: 2 %
Eosinophils Absolute: 0.1 10*3/uL (ref 0.0–0.5)
Eosinophils Relative: 2 %
HCT: 31.5 % — ABNORMAL LOW (ref 34.8–46.6)
HEMOGLOBIN: 10.6 g/dL — AB (ref 11.6–15.9)
LYMPHS ABS: 0.7 10*3/uL — AB (ref 0.9–3.3)
Lymphocytes Relative: 24 %
MCH: 32.1 pg (ref 25.1–34.0)
MCHC: 33.8 g/dL (ref 31.5–36.0)
MCV: 95.1 fL (ref 79.5–101.0)
MONOS PCT: 24 %
Monocytes Absolute: 0.7 10*3/uL (ref 0.1–0.9)
NEUTROS ABS: 1.4 10*3/uL — AB (ref 1.5–6.5)
NEUTROS PCT: 48 %
Platelets: 191 10*3/uL (ref 145–400)
RBC: 3.32 MIL/uL — ABNORMAL LOW (ref 3.70–5.45)
RDW: 21.5 % — ABNORMAL HIGH (ref 11.2–14.5)
WBC: 3 10*3/uL — ABNORMAL LOW (ref 3.9–10.3)

## 2017-10-18 LAB — URINALYSIS, COMPLETE (UACMP) WITH MICROSCOPIC
Bilirubin Urine: NEGATIVE
GLUCOSE, UA: 50 mg/dL — AB
Ketones, ur: NEGATIVE mg/dL
NITRITE: NEGATIVE
PROTEIN: 30 mg/dL — AB
Specific Gravity, Urine: 1.01 (ref 1.005–1.030)
pH: 6 (ref 5.0–8.0)

## 2017-10-18 LAB — LIPASE, BLOOD: Lipase: 305 U/L — ABNORMAL HIGH (ref 11–51)

## 2017-10-18 MED ORDER — OXALIPLATIN CHEMO INJECTION 100 MG/20ML
70.0000 mg/m2 | Freq: Once | INTRAVENOUS | Status: AC
  Administered 2017-10-18: 115 mg via INTRAVENOUS
  Filled 2017-10-18: qty 20

## 2017-10-18 MED ORDER — SODIUM CHLORIDE 0.9 % IV SOLN
INTRAVENOUS | Status: AC
  Administered 2017-10-18: 12:00:00 via INTRAVENOUS

## 2017-10-18 MED ORDER — PALONOSETRON HCL INJECTION 0.25 MG/5ML
INTRAVENOUS | Status: AC
  Filled 2017-10-18: qty 5

## 2017-10-18 MED ORDER — FLUOROURACIL CHEMO INJECTION 5 GM/100ML
2400.0000 mg/m2 | INTRAVENOUS | Status: DC
  Administered 2017-10-18: 4000 mg via INTRAVENOUS
  Filled 2017-10-18: qty 80

## 2017-10-18 MED ORDER — LEUCOVORIN CALCIUM INJECTION 350 MG
400.0000 mg/m2 | Freq: Once | INTRAVENOUS | Status: AC
  Administered 2017-10-18: 668 mg via INTRAVENOUS
  Filled 2017-10-18: qty 33.4

## 2017-10-18 MED ORDER — PALONOSETRON HCL INJECTION 0.25 MG/5ML
0.2500 mg | Freq: Once | INTRAVENOUS | Status: AC
  Administered 2017-10-18: 0.25 mg via INTRAVENOUS

## 2017-10-18 MED ORDER — SODIUM CHLORIDE 0.9% FLUSH
10.0000 mL | Freq: Once | INTRAVENOUS | Status: AC
  Administered 2017-10-18: 10 mL
  Filled 2017-10-18: qty 10

## 2017-10-18 MED ORDER — SODIUM CHLORIDE 0.9 % IV SOLN
Freq: Once | INTRAVENOUS | Status: AC
  Administered 2017-10-18: 13:00:00 via INTRAVENOUS
  Filled 2017-10-18: qty 5

## 2017-10-18 MED ORDER — ATROPINE SULFATE 1 MG/ML IJ SOLN
INTRAMUSCULAR | Status: AC
Start: 2017-10-18 — End: ?
  Filled 2017-10-18: qty 1

## 2017-10-18 MED ORDER — DEXTROSE 5 % IV SOLN
Freq: Once | INTRAVENOUS | Status: AC
  Administered 2017-10-18: 12:00:00 via INTRAVENOUS

## 2017-10-18 MED ORDER — IRINOTECAN HCL CHEMO INJECTION 100 MG/5ML
140.0000 mg/m2 | Freq: Once | INTRAVENOUS | Status: AC
  Administered 2017-10-18: 240 mg via INTRAVENOUS
  Filled 2017-10-18: qty 12

## 2017-10-18 MED ORDER — ATROPINE SULFATE 1 MG/ML IJ SOLN
0.5000 mg | Freq: Once | INTRAMUSCULAR | Status: AC | PRN
  Administered 2017-10-18: 0.5 mg via INTRAVENOUS

## 2017-10-18 MED ORDER — CIPROFLOXACIN HCL 250 MG PO TABS: 250.0000 mg | ORAL_TABLET | Freq: Two times a day (BID) | ORAL | 0 refills | Status: DC

## 2017-10-18 NOTE — Patient Instructions (Signed)
[facility] Discharge Instructions for Patients Receiving Chemotherapy  Today you received the following chemotherapy agents: Oxaliplatin (Eloxatin), Leucovorin, Irinotecan (Camptosar), and Fluorouracil (Adricil, 5-FU).  To help prevent nausea and vomiting after your treatment, we encourage you to take your nausea medication as prescribed. Received Aloxi during treatment-->take Compazine (not Zofran) for the next 3 days.  If you develop nausea and vomiting that is not controlled by your nausea medication, call the clinic.   BELOW ARE SYMPTOMS THAT SHOULD BE REPORTED IMMEDIATELY:  *FEVER GREATER THAN 100.5 F  *CHILLS WITH OR WITHOUT FEVER  NAUSEA AND VOMITING THAT IS NOT CONTROLLED WITH YOUR NAUSEA MEDICATION  *UNUSUAL SHORTNESS OF BREATH  *UNUSUAL BRUISING OR BLEEDING  TENDERNESS IN MOUTH AND THROAT WITH OR WITHOUT PRESENCE OF ULCERS  *URINARY PROBLEMS  *BOWEL PROBLEMS  UNUSUAL RASH Items with * indicate a potential emergency and should be followed up as soon as possible.  Feel free to call the clinic should you have any questions or concerns. The clinic phone number is [phone].  Please show the [name] at check-in to the Emergency Department and triage nurse.

## 2017-10-18 NOTE — Progress Notes (Signed)
Reviewed pt labs (CBC & CMET) with Dr. Burr Medico; pt ok to treat with ANC 1.4 and creatinine 1.9. Pt to receive IVF per orders; refer to [facility]. Pt to be seen by Dr. Burr Medico during infusion.

## 2017-10-18 NOTE — Progress Notes (Signed)
  Oncology Nurse Navigator Documentation  Navigator Location: [facility] (10/18/17 1100)   )Navigator Encounter Type: Other(Infusion Room) (10/18/17 1100)  Met with patient in infusion room to provide encouragement and support. Patient "celebrating" after getting her CT reports today. Patient shared that she has felt supported by family and friends throughout her journey. No navigation needs identified.                           Interventions: Psycho-social support (10/18/17 1100)            Acuity: Level 1 (10/18/17 1100)         Time Spent with Patient: 30 (10/18/17 1100)

## 2017-10-18 NOTE — Progress Notes (Signed)
Nutrition follow-up completed with patient receiving treatment for metastatic pancreas cancer. Patient reports bowels are okay right now. Reports she has increased gas with drinking Carnation instant breakfast. She is pleased her weight has only decreased approximately 1 pounds since last visit. Weight was documented as 116.5 pounds February 25 down from 117.2 pounds February 11.  Nutrition diagnosis: Food and nutrition related knowledge deficit has improved.  Intervention: Provided alternate suggestions for Carnation instant breakfast as patient may be experiencing some lactose intolerance. Reviewed strategies for adding high-calorie foods. Questions were answered and teach back method used.  Monitoring evaluation goals: Patient will work to increase calories and protein to promote weight stabilization.  Next visit: Monday, March 11 during infusion.  **Disclaimer: This note was dictated with voice recognition software. Similar sounding words can inadvertently be transcribed and this note may contain transcription errors which may not have been corrected upon publication of note.**

## 2017-10-19 LAB — CANCER ANTIGEN 19-9: CA 19-9: 40 U/mL — ABNORMAL HIGH (ref 0–35)

## 2017-10-20 ENCOUNTER — Inpatient Hospital Stay: Payer: 59

## 2017-10-20 VITALS — BP 152/83 | HR 78 | Temp 98.0°F | Resp 18

## 2017-10-20 DIAGNOSIS — Z5111 Encounter for antineoplastic chemotherapy: Secondary | ICD-10-CM | POA: Diagnosis not present

## 2017-10-20 DIAGNOSIS — C787 Secondary malignant neoplasm of liver and intrahepatic bile duct: Principal | ICD-10-CM

## 2017-10-20 DIAGNOSIS — C259 Malignant neoplasm of pancreas, unspecified: Secondary | ICD-10-CM

## 2017-10-20 DIAGNOSIS — Z7189 Other specified counseling: Secondary | ICD-10-CM

## 2017-10-20 MED ORDER — SODIUM CHLORIDE 0.9% FLUSH
10.0000 mL | INTRAVENOUS | Status: DC | PRN
  Administered 2017-10-20: 10 mL
  Filled 2017-10-20: qty 10

## 2017-10-20 MED ORDER — HEPARIN SOD (PORK) LOCK FLUSH 100 UNIT/ML IV SOLN
500.0000 [IU] | Freq: Once | INTRAVENOUS | Status: AC | PRN
  Administered 2017-10-20: 500 [IU]
  Filled 2017-10-20: qty 5

## 2017-10-21 ENCOUNTER — Telehealth: Payer: Self-pay | Admitting: *Deleted

## 2017-10-21 DIAGNOSIS — C259 Malignant neoplasm of pancreas, unspecified: Secondary | ICD-10-CM | POA: Diagnosis not present

## 2017-10-21 LAB — URINE CULTURE

## 2017-10-21 NOTE — Telephone Encounter (Signed)
-----   Message from [name], MD sent at 10/21/2017 11:35 AM EST ----- Please let her know the urine culture result, and it is sensitive to cipro which I prescribed last time. Check to see if her UTI symptoms resolved now, thanks  [name]  10/21/2017

## 2017-10-21 NOTE — Telephone Encounter (Signed)
Called pt at home without answer.   Left message on voice mail of Dr. Ernestina Penna instructions below.   Asked pt to call office back to let us know if her UTI symptoms have resolved.

## 2017-10-25 DIAGNOSIS — H903 Sensorineural hearing loss, bilateral: Secondary | ICD-10-CM | POA: Diagnosis not present

## 2017-11-01 ENCOUNTER — Telehealth: Payer: Self-pay | Admitting: Hematology

## 2017-11-01 ENCOUNTER — Inpatient Hospital Stay: Payer: 59 | Attending: Hematology

## 2017-11-01 ENCOUNTER — Inpatient Hospital Stay: Payer: 59

## 2017-11-01 ENCOUNTER — Inpatient Hospital Stay (HOSPITAL_BASED_OUTPATIENT_CLINIC_OR_DEPARTMENT_OTHER): Payer: 59 | Admitting: Nurse Practitioner

## 2017-11-01 ENCOUNTER — Encounter: Payer: Self-pay | Admitting: Nurse Practitioner

## 2017-11-01 ENCOUNTER — Inpatient Hospital Stay: Payer: 59 | Admitting: Nutrition

## 2017-11-01 VITALS — BP 146/86 | HR 81 | Temp 98.2°F | Resp 16 | Ht 65.0 in | Wt 112.7 lb

## 2017-11-01 DIAGNOSIS — Z5111 Encounter for antineoplastic chemotherapy: Secondary | ICD-10-CM | POA: Diagnosis not present

## 2017-11-01 DIAGNOSIS — C259 Malignant neoplasm of pancreas, unspecified: Secondary | ICD-10-CM

## 2017-11-01 DIAGNOSIS — N183 Chronic kidney disease, stage 3 (moderate): Secondary | ICD-10-CM

## 2017-11-01 DIAGNOSIS — R740 Nonspecific elevation of levels of transaminase and lactic acid dehydrogenase [LDH]: Secondary | ICD-10-CM | POA: Diagnosis not present

## 2017-11-01 DIAGNOSIS — R7989 Other specified abnormal findings of blood chemistry: Secondary | ICD-10-CM

## 2017-11-01 DIAGNOSIS — E876 Hypokalemia: Secondary | ICD-10-CM | POA: Insufficient documentation

## 2017-11-01 DIAGNOSIS — R630 Anorexia: Secondary | ICD-10-CM | POA: Insufficient documentation

## 2017-11-01 DIAGNOSIS — C787 Secondary malignant neoplasm of liver and intrahepatic bile duct: Secondary | ICD-10-CM | POA: Insufficient documentation

## 2017-11-01 DIAGNOSIS — Z7189 Other specified counseling: Secondary | ICD-10-CM

## 2017-11-01 DIAGNOSIS — Z452 Encounter for adjustment and management of vascular access device: Secondary | ICD-10-CM | POA: Insufficient documentation

## 2017-11-01 DIAGNOSIS — D649 Anemia, unspecified: Secondary | ICD-10-CM

## 2017-11-01 DIAGNOSIS — H8109 Meniere's disease, unspecified ear: Secondary | ICD-10-CM | POA: Diagnosis not present

## 2017-11-01 DIAGNOSIS — R634 Abnormal weight loss: Secondary | ICD-10-CM | POA: Insufficient documentation

## 2017-11-01 DIAGNOSIS — D709 Neutropenia, unspecified: Secondary | ICD-10-CM | POA: Diagnosis not present

## 2017-11-01 DIAGNOSIS — C251 Malignant neoplasm of body of pancreas: Secondary | ICD-10-CM

## 2017-11-01 DIAGNOSIS — R109 Unspecified abdominal pain: Secondary | ICD-10-CM | POA: Insufficient documentation

## 2017-11-01 DIAGNOSIS — R112 Nausea with vomiting, unspecified: Secondary | ICD-10-CM | POA: Diagnosis not present

## 2017-11-01 LAB — COMPREHENSIVE METABOLIC PANEL
ALT: 58 U/L — AB (ref 0–55)
AST: 75 U/L — ABNORMAL HIGH (ref 5–34)
Albumin: 3.4 g/dL — ABNORMAL LOW (ref 3.5–5.0)
Alkaline Phosphatase: 217 U/L — ABNORMAL HIGH (ref 40–150)
Anion gap: 11 (ref 3–11)
BUN: 15 mg/dL (ref 7–26)
CHLORIDE: 103 mmol/L (ref 98–109)
CO2: 26 mmol/L (ref 22–29)
CREATININE: 1.94 mg/dL — AB (ref 0.60–1.10)
Calcium: 9.9 mg/dL (ref 8.4–10.4)
GFR calc Af Amer: 33 mL/min — ABNORMAL LOW (ref >60)
GFR, EST NON AFRICAN AMERICAN: 28 mL/min — AB (ref >60)
Glucose, Bld: 103 mg/dL (ref 70–140)
POTASSIUM: 3.4 mmol/L — AB (ref 3.5–5.1)
Sodium: 140 mmol/L (ref 136–145)
Total Bilirubin: 0.6 mg/dL (ref 0.2–1.2)
Total Protein: 7.4 g/dL (ref 6.4–8.3)

## 2017-11-01 LAB — CBC WITH DIFFERENTIAL/PLATELET
Basophils Absolute: 0.1 10*3/uL (ref 0.0–0.1)
Basophils Relative: 2 %
EOS ABS: 0.1 10*3/uL (ref 0.0–0.5)
Eosinophils Relative: 2 %
HCT: 31.5 % — ABNORMAL LOW (ref 34.8–46.6)
HEMOGLOBIN: 10.5 g/dL — AB (ref 11.6–15.9)
LYMPHS ABS: 0.6 10*3/uL — AB (ref 0.9–3.3)
Lymphocytes Relative: 23 %
MCH: 33 pg (ref 25.1–34.0)
MCHC: 33.4 g/dL (ref 31.5–36.0)
MCV: 98.8 fL (ref 79.5–101.0)
Monocytes Absolute: 0.6 10*3/uL (ref 0.1–0.9)
Monocytes Relative: 23 %
NEUTROS PCT: 50 %
Neutro Abs: 1.4 10*3/uL — ABNORMAL LOW (ref 1.5–6.5)
Platelets: 172 10*3/uL (ref 145–400)
RBC: 3.19 MIL/uL — AB (ref 3.70–5.45)
RDW: 22.1 % — ABNORMAL HIGH (ref 11.2–14.5)
WBC: 2.7 10*3/uL — AB (ref 3.9–10.3)

## 2017-11-01 LAB — TOTAL PROTEIN, URINE DIPSTICK: PROTEIN: 30 mg/dL — AB

## 2017-11-01 MED ORDER — DEXTROSE 5 % IV SOLN
Freq: Once | INTRAVENOUS | Status: AC
  Administered 2017-11-01: 11:00:00 via INTRAVENOUS

## 2017-11-01 MED ORDER — SODIUM CHLORIDE 0.9% FLUSH
10.0000 mL | INTRAVENOUS | Status: DC | PRN
  Filled 2017-11-01: qty 10

## 2017-11-01 MED ORDER — OXALIPLATIN CHEMO INJECTION 100 MG/20ML
70.0000 mg/m2 | Freq: Once | INTRAVENOUS | Status: AC
  Administered 2017-11-01: 115 mg via INTRAVENOUS
  Filled 2017-11-01: qty 23

## 2017-11-01 MED ORDER — SODIUM CHLORIDE 0.9 % IV SOLN
2400.0000 mg/m2 | INTRAVENOUS | Status: DC
  Administered 2017-11-01: 4000 mg via INTRAVENOUS
  Filled 2017-11-01: qty 80

## 2017-11-01 MED ORDER — SODIUM CHLORIDE 0.9 % IV SOLN
Freq: Once | INTRAVENOUS | Status: AC
  Administered 2017-11-01: 15:00:00 via INTRAVENOUS

## 2017-11-01 MED ORDER — HEPARIN SOD (PORK) LOCK FLUSH 100 UNIT/ML IV SOLN
500.0000 [IU] | Freq: Once | INTRAVENOUS | Status: DC | PRN
  Filled 2017-11-01: qty 5

## 2017-11-01 MED ORDER — PALONOSETRON HCL INJECTION 0.25 MG/5ML
0.2500 mg | Freq: Once | INTRAVENOUS | Status: AC
  Administered 2017-11-01: 0.25 mg via INTRAVENOUS

## 2017-11-01 MED ORDER — FOSAPREPITANT DIMEGLUMINE INJECTION 150 MG
Freq: Once | INTRAVENOUS | Status: AC
  Administered 2017-11-01: 11:00:00 via INTRAVENOUS
  Filled 2017-11-01: qty 5

## 2017-11-01 MED ORDER — ATROPINE SULFATE 1 MG/ML IJ SOLN
0.5000 mg | Freq: Once | INTRAMUSCULAR | Status: AC | PRN
  Administered 2017-11-01: 0.5 mg via INTRAVENOUS

## 2017-11-01 MED ORDER — IRINOTECAN HCL CHEMO INJECTION 100 MG/5ML
140.0000 mg/m2 | Freq: Once | INTRAVENOUS | Status: AC
  Administered 2017-11-01: 240 mg via INTRAVENOUS
  Filled 2017-11-01: qty 12

## 2017-11-01 MED ORDER — ATROPINE SULFATE 1 MG/ML IJ SOLN
INTRAMUSCULAR | Status: AC
Start: 2017-11-01 — End: ?
  Filled 2017-11-01: qty 1

## 2017-11-01 MED ORDER — PALONOSETRON HCL INJECTION 0.25 MG/5ML
INTRAVENOUS | Status: AC
  Filled 2017-11-01: qty 5

## 2017-11-01 MED ORDER — LEUCOVORIN CALCIUM INJECTION 350 MG
400.0000 mg/m2 | Freq: Once | INTRAVENOUS | Status: AC
  Administered 2017-11-01: 668 mg via INTRAVENOUS
  Filled 2017-11-01: qty 33.4

## 2017-11-01 NOTE — Progress Notes (Signed)
Nutrition follow-up completed with patient receiving treatment for metastatic pancreas cancer. Patient reports consuming between 0 and 1 boost plus daily. Reports she has been eating well. Reports bowel movement every other day/denies constipation. Denies nausea. Noted weight decreased 112.7 pounds March 11 down from 116.5 pounds February 25.  Nutrition diagnosis: Food and nutrition related knowledge deficit improved.  Intervention: Recommended patient increase oral nutrition supplements twice daily. Encouraged continued consumption of high-calorie high-protein foods. Support and encouragement were provided.  Monitoring evaluation goals: Patient will continue to work to increase calories and protein to minimize further weight loss.  Next visit: Monday, April 8 during infusion.  **Disclaimer: This note was dictated with voice recognition software. Similar sounding words can inadvertently be transcribed and this note may contain transcription errors which may not have been corrected upon publication of note.**

## 2017-11-01 NOTE — Patient Instructions (Signed)
[place] Discharge Instructions for Patients Receiving Chemotherapy  Today you received the following chemotherapy agents Oxaliplatin, Leucovorin, Irinotecan, and Fluorouracil.   To help prevent nausea and vomiting after your treatment, we encourage you to take your nausea medication as directed.  If you develop nausea and vomiting that is not controlled by your nausea medication, call the clinic.   BELOW ARE SYMPTOMS THAT SHOULD BE REPORTED IMMEDIATELY:  *FEVER GREATER THAN 100.5 F  *CHILLS WITH OR WITHOUT FEVER  NAUSEA AND VOMITING THAT IS NOT CONTROLLED WITH YOUR NAUSEA MEDICATION  *UNUSUAL SHORTNESS OF BREATH  *UNUSUAL BRUISING OR BLEEDING  TENDERNESS IN MOUTH AND THROAT WITH OR WITHOUT PRESENCE OF ULCERS  *URINARY PROBLEMS  *BOWEL PROBLEMS  UNUSUAL RASH Items with * indicate a potential emergency and should be followed up as soon as possible.  Feel free to call the clinic should you have any questions or concerns. The clinic phone number is [phone].  Please show the [place] at check-in to the Emergency Department and triage nurse.

## 2017-11-01 NOTE — Progress Notes (Signed)
Per Cira Rue, NP it is ok to treat patient today with abnormal labs neutrophil and creatinine counts.

## 2017-11-01 NOTE — Telephone Encounter (Signed)
Appointments scheduled AVS/Calendar printed per 3/11 los °

## 2017-11-01 NOTE — Progress Notes (Signed)
[place]  [phone] [phone]) [phone]  Clinic Follow up Note   Patient Care Team: [name], MD as PCP - General (Family Medicine) [name], MD as Consulting Physician (Otolaryngology) 11/01/2017  SUMMARY OF ONCOLOGIC HISTORY: Oncology History   Cancer Staging Pancreatic cancer [facility]) Staging form: Exocrine Pancreas, AJCC 8th Edition - Clinical stage from 08/06/2017: Stage IV (cTX, cN0, pM1) - Signed by [name], MD on 08/12/2017       Pancreatic cancer ([place])   07/23/2017 Imaging    US abdomen limited RUQ 07/23/17 IMPRESSION: 1. Cholelithiasis.  No secondary signs of acute cholecystitis. 2. Multiple solid liver masses measuring up to 6.5 cm in the left lobe of the liver, evaluation for metastatic disease is recommended. These results will be called to the ordering clinician or representative by the Radiologist Assistant, and communication documented in the PACS or zVision Dashboard.      07/23/2017 Imaging    CT Abdomen W Contrast 07/23/17 IMPRESSION: 1. Widespread metastatic disease throughout the liver. No clear primary malignancy identified in the abdomen. The pelvis was not imaged. Tissue sampling recommended. 2. Probable adenopathy superior to the pancreatic tail. No evidence of pancreatic mass. 3. Suspected incidental hemangioma inferiorly in the right hepatic lobe. 4. Nonspecific nodularity in the breasts. The patient has undergone recent (03/25/2017 and 04/01/2017) mammography and ultrasound.      07/29/2017 Initial Diagnosis    Metastasis to liver of unknown origin ([place])      07/29/2017 PET scan    PET 07/29/17  IMPRESSION: 1. Numerous bulky liver masses are hypermetabolic compatible with malignancy. 2. Accentuated activity within or along the pancreatic tail, likely represent a primary pancreatic tumor. Consider pancreatic protocol MRI to further work this up. 3. The peripancreatic lymph node shown above the  pancreatic tail is mildly hypermetabolic favoring malignancy. 4.  Prominent stool throughout the colon favors constipation. 5. 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      08/05/2017 Pathology Results    Liver Biopsy  Diagnosis 08/05/17 Liver, needle/core biopsy - CARCINOMA. - SEE COMMENT. Microscopic Comment The malignant cells are positive for cytokeratin 7. They are negative for arginase, CDX2, cytokeratin 20, estrogen receptor, GATA-3, GCDFP, Glypican 3, Hep Par 1, Napsin A, and TTF-1. This immunohistochemical is nonspecific. Possibly primary sources include pancreatobiliary and upper gastrointestinal. Radiologic correlation is necessary. Of note, organ specific markers (GATA-3, GCDFP-breast, TTF-1, Napsin A-lung, and CDX2-colon) are negative. (JBK:ecj 08/09/2017)      08/21/2017 -  Chemotherapy    FOLFIRINOX every 2 weeks starting 08/21/17        10/14/2017 Imaging    CT CAP WO Contrast 10/14/17 IMPRESSION: Evidence of known numerous liver metastases without significant interval change. No other evidence of metastatic disease within the chest, abdomen or pelvis. Cholelithiasis. Tiny pericardial effusion.     CURRENT THERAPY: FOLFIRINOX q2 weeks started 08/21/17  INTERVAL HISTORY: Morgan Dawson returns for follow up as scheduled prior to cycle 6 FOLFIRINOX. Last treated 10/18/17. She feels well today. She had no nausea or vomiting with last cycle, continues zofran BID for prophylaxis. No constipation or diarrhea. Good appetite overall, eating and drinking well. Gets up twice each night to void due to increased drinking. Takes supplement sporadically when she notices appetite is lower than normal. Experiences mild-moderate fatigue few days after chemo, energy level returned to normal this past week. She always has mild level of cold sensitivity but is increased and "bothersome" for 8 days after chemo; no numbness or tingling. Denies hematuria  or dysuria, completed 3-day course of  cipro on 2/28. She has no specific complaints today.  REVIEW OF SYSTEMS:   Constitutional: Denies fevers, chills (+) weight loss (+) mild-moderate fatigue few days after chemo  Eyes: Denies blurriness of vision Ears, nose, mouth, throat, and face: Denies mucositis or sore throat Respiratory: Denies cough, dyspnea or wheezes Cardiovascular: Denies palpitation, chest discomfort or lower extremity swelling Gastrointestinal:  Denies nausea, vomiting, constipation, diarrhea, heartburn or change in bowel habits  GU: Denies dysuria or hematuria (+) nocturia, secondary to increased po liquid intake Skin: Denies abnormal skin rashes Lymphatics: Denies new lymphadenopathy or easy bruising Neurological:Denies numbness, tingling or new weaknesses (+) constant mild cold sensitivity, increased 8 days after chemo Behavioral/Psych: Mood is stable, no new changes  All other systems were reviewed with the patient and are negative.  MEDICAL HISTORY:  Past Medical History:  Diagnosis Date  . Meniere disease 2016  . Pancreatitis     SURGICAL HISTORY: Past Surgical History:  Procedure Laterality Date  . CERVICAL ABLATION    . ENDOMETRIAL ABLATION  2011  . IR FLUORO GUIDE PORT INSERTION RIGHT  08/12/2017  . IR US GUIDE VASC ACCESS RIGHT  08/12/2017  . [place]    I have reviewed the social history and family history with the patient and they are unchanged from previous note.  ALLERGIES:  has No Known Allergies.  MEDICATIONS:  Current Outpatient Medications  Medication Sig Dispense Refill  . bisacodyl (DULCOLAX) 5 MG EC tablet Take 5 mg by mouth daily as needed for mild constipation or moderate constipation.    . lidocaine-prilocaine (EMLA) cream Apply to affected area once 30 g 3  . loratadine (CLARITIN) 10 MG tablet Take 10 mg by mouth daily as needed for allergies.    . magic mouthwash w/lidocaine SOLN Take 10 mLs by mouth 3 (three) times daily as needed for mouth pain. Swish and  spit 10 ML by mouth 3 times daily as needed for mouth pain. 240 mL 0  . meclizine (ANTIVERT) 25 MG tablet Take 25 mg by mouth 3 (three) times daily as needed for dizziness.    . ondansetron (ZOFRAN-ODT) 8 MG disintegrating tablet Take 1 tablet (8 mg total) by mouth every 8 (eight) hours as needed for nausea or vomiting. 40 tablet 2  . polyethylene glycol (MIRALAX / GLYCOLAX) packet Take 17 g by mouth daily as needed for mild constipation.    . potassium chloride (MICRO-K) 10 MEQ CR capsule TAKE 2 CAPSULES(20 MEQ) BY MOUTH TWICE DAILY FOR 5 DAYS THEN TAKE 2 CAPSULES BY MOUTH DAILY 70 capsule 0  . prochlorperazine (COMPAZINE) 10 MG tablet Take 1 tablet (10 mg total) by mouth every 6 (six) hours as needed for nausea or vomiting. 40 tablet 2  . spironolactone (ALDACTONE) 25 MG tablet Take 1 tablet (25 mg total) by mouth daily. 90 tablet 1  . traMADol (ULTRAM) 50 MG tablet Take 1 tablet (50 mg total) by mouth every 6 (six) hours as needed. 30 tablet 0   No current facility-administered medications for this visit.     PHYSICAL EXAMINATION: ECOG PERFORMANCE STATUS: 1 - Symptomatic but completely ambulatory  Vitals:   11/01/17 0951  BP: (!) 146/86  Pulse: 81  Resp: 16  Temp: 98.2 F (36.8 C)  SpO2: 100%   Filed Weights   11/01/17 0951  Weight: 112 lb 11.2 oz (51.1 kg)    GENERAL:alert, no distress and comfortable SKIN: skin color, texture, turgor are normal, no  rashes or significant lesions EYES: normal, Conjunctiva are pink and non-injected, sclera clear OROPHARYNX:no exudate, no erythema and lips, buccal mucosa, and tongue normal  LYMPH:  no palpable cervical, supraclavicular, axillary, or inguinal lymphadenopathy  LUNGS: clear to auscultation bilaterally with normal breathing effort HEART: regular rate & rhythm and no murmurs and no lower extremity edema ABDOMEN:abdomen soft, flat and normal bowel sounds. Hepatomegaly with mild tenderness on palpation  Musculoskeletal:no cyanosis of  digits and no clubbing  NEURO: alert & oriented x 3 with fluent speech, no focal motor/sensory deficits PAC without erythema   LABORATORY DATA:  I have reviewed the data as listed CBC Latest Ref Rng & Units 11/01/2017 10/18/2017 10/06/2017  WBC 3.9 - 10.3 K/uL 2.7(L) 3.0(L) 2.2(L)  Hemoglobin 11.6 - 15.9 g/dL 10.5(L) 10.6(L) 10.6(L)  Hematocrit 34.8 - 46.6 % 31.5(L) 31.5(L) 32.9(L)  Platelets 145 - 400 K/uL 172 191 217     CMP Latest Ref Rng & Units 11/01/2017 10/18/2017 10/06/2017  Glucose 70 - 140 mg/dL 103 106 110  BUN 7 - 26 mg/dL 15 14 17   Creatinine 0.60 - 1.10 mg/dL 1.94(H) 1.97(H) 1.77(H)  Sodium 136 - 145 mmol/L 140 140 140  Potassium 3.5 - 5.1 mmol/L 3.4(L) 3.2(L) 3.1(L)  Chloride 98 - 109 mmol/L 103 103 100  CO2 22 - 29 mmol/L 26 26 28   Calcium 8.4 - 10.4 mg/dL 9.9 9.9 9.4  Total Protein 6.4 - 8.3 g/dL 7.4 7.4 7.2  Total Bilirubin 0.2 - 1.2 mg/dL 0.6 0.6 0.5  Alkaline Phos 40 - 150 U/L 217(H) 215(H) 214(H)  AST 5 - 34 U/L 75(H) 67(H) 106(H)  ALT 0 - 55 U/L 58(H) 47 70(H)   PATHOLOGY  Liver Biopsy  Diagnosis 08/05/17 Liver, needle/core biopsy - CARCINOMA. - SEE COMMENT. Microscopic Comment The malignant cells are positive for cytokeratin 7. They are negative for arginase, CDX2, cytokeratin 20, estrogen receptor, GATA-3, GCDFP, Glypican 3, Hep Par 1, Napsin A, and TTF-1. This immunohistochemical is nonspecific. Possibly primary sources include pancreatobiliary and upper gastrointestinal. Radiologic correlation is necessary. Of note, organ specific markers (GATA-3, GCDFP-breast, TTF-1, Napsin A-lung, and CDX2-colon) are negative. (JBK:ecj 08/09/2017)   Diagnosis 03/07/13 Surgical, cecum, polyp -TUBULAR ADENOMA -NEGATIVE FOR HIGH GRADE DYSPLASIA   PROCEDURES  Colonoscopy by Dr. Paulita Fujita 03/07/13 IMPRESSION:  -One 5 m polyp in the cecum. Resected and retrieved -The distal rectum and anal verge are normal on retroflexion view.  -The examination was otherwise  normal.     RADIOGRAPHIC STUDIES: I have personally reviewed the radiological images as listed and agreed with the findings in the report. No results found.   ASSESSMENT & PLAN: Morgan Dawson is a 57 y.o. caucasian female with a history of vertigo and menieres disease, presented with epigastric discomfort, low appetite and 5 pound weight loss   1. 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 Pancreatitis 3. Nausea and vomiting 4. Anorexia, weight loss 5. Hypercalcemia 6. Transaminitis 7. Goals of care discussion 8. Hypokalemia, CKD stage III 9. Meniere's disease  10. UTI  Morgan Dawson appears stable. She completed 5 cycles of FOLFIRINOX chemotherapy. She is tolerating moderately well overall. Nausea is very well controlled. She has mild weight loss, will take 1 nutritional boost/ensure supplement daily and f/u with nutrition today. VS stable. Labs reviewed, mild neutropenia is stable, ANC 1.4; anemia is stable, Hgb 10.4. Cr stable but remains elevated. Adrenals and urinary tract was normal, no hydronephrosis on non-contrast CT 10/14/17. Stop aldactone. Will obtain renal US this  week or next. IVF with chemo today. LFTs stable, CA 19-9 trending down. Labs adequate for treatment. Proceed with cycle 6 FOLFIRINOX. Continue dose reduced irinotecan for elevated creatinine. Return in 2 weeks for f/u and cycle 7.    PLAN -Labs reviewed, OK to proceed with cycle 6 FOLFIRINOX, dose reduced irinotecan -1 L NS today with chemo -Renal US this week or next -Stop Aldactone -F/u nutrition, increase supplement to at least 1 daily    Orders Placed This Encounter  Procedures  . US RENAL    Standing Status:   Future    Standing Expiration Date:   01/02/2019    Order Specific Question:   Reason for Exam (SYMPTOM  OR DIAGNOSIS REQUIRED)    Answer:   metastatic pancreatic cancer to liver, elevated creatinine on chemotherapy    Order Specific Question:   Preferred imaging location?     Answer:   [facility]   All questions were answered. The patient knows to call the clinic with any problems, questions or concerns. No barriers to learning was detected. I spent 20 minutes counseling the patient face to face. The total time spent in the appointment was 25 minutes and more than 50% was on counseling and review of test results     Alla Feeling, NP 11/01/17

## 2017-11-03 ENCOUNTER — Inpatient Hospital Stay: Payer: 59

## 2017-11-03 VITALS — BP 142/78 | HR 78 | Temp 98.6°F | Resp 16

## 2017-11-03 DIAGNOSIS — Z5111 Encounter for antineoplastic chemotherapy: Secondary | ICD-10-CM | POA: Diagnosis not present

## 2017-11-03 DIAGNOSIS — Z7189 Other specified counseling: Secondary | ICD-10-CM

## 2017-11-03 DIAGNOSIS — C259 Malignant neoplasm of pancreas, unspecified: Secondary | ICD-10-CM

## 2017-11-03 DIAGNOSIS — C787 Secondary malignant neoplasm of liver and intrahepatic bile duct: Principal | ICD-10-CM

## 2017-11-03 MED ORDER — HEPARIN SOD (PORK) LOCK FLUSH 100 UNIT/ML IV SOLN
500.0000 [IU] | Freq: Once | INTRAVENOUS | Status: AC | PRN
  Administered 2017-11-03: 500 [IU]
  Filled 2017-11-03: qty 5

## 2017-11-03 MED ORDER — SODIUM CHLORIDE 0.9% FLUSH
10.0000 mL | INTRAVENOUS | Status: DC | PRN
  Administered 2017-11-03: 10 mL
  Filled 2017-11-03: qty 10

## 2017-11-05 ENCOUNTER — Telehealth: Payer: Self-pay | Admitting: Emergency Medicine

## 2017-11-05 NOTE — Telephone Encounter (Signed)
Renal US scheduled. Date/time given to pt. Full bladder 30 min prior. Pt verbalized understanding.

## 2017-11-08 ENCOUNTER — Ambulatory Visit (HOSPITAL_COMMUNITY)
Admission: RE | Admit: 2017-11-08 | Discharge: 2017-11-08 | Disposition: A | Discharge status: Home / Self Care | Payer: 59 | Source: Ambulatory Visit | Attending: Nurse Practitioner | Admitting: Nurse Practitioner

## 2017-11-08 DIAGNOSIS — C259 Malignant neoplasm of pancreas, unspecified: Secondary | ICD-10-CM | POA: Diagnosis not present

## 2017-11-08 DIAGNOSIS — R7989 Other specified abnormal findings of blood chemistry: Secondary | ICD-10-CM

## 2017-11-08 DIAGNOSIS — C787 Secondary malignant neoplasm of liver and intrahepatic bile duct: Secondary | ICD-10-CM | POA: Insufficient documentation

## 2017-11-12 NOTE — Progress Notes (Signed)
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 Cholelithiasis.  No secondary signs of acute cholecystitis. 2. Multiple solid liver masses measuring up to 6.5 cm in the left lobe of the liver, evaluation for metastatic disease is recommended. These results will be called to the ordering clinician or representative by the Radiologist Assistant, and communication documented in the PACS or zVision Dashboard.      07/23/2017 Imaging    CT Abdomen W Contrast 07/23/17 IMPRESSION: 1. Widespread metastatic disease throughout the liver. No clear primary malignancy identified in the abdomen. The pelvis was not imaged. Tissue sampling recommended. 2. Probable adenopathy superior to the pancreatic tail. No evidence of pancreatic mass. 3. Suspected incidental hemangioma inferiorly in the right hepatic lobe. 4. Nonspecific nodularity in the breasts. The patient has undergone recent (03/25/2017 and 04/01/2017) mammography and ultrasound.      07/29/2017 Initial Diagnosis    Metastasis to liver of unknown origin ([place])      07/29/2017 PET scan    PET 07/29/17  IMPRESSION: 1. Numerous bulky liver masses are hypermetabolic compatible with malignancy. 2. Accentuated activity within or along the pancreatic tail, likely represent a primary pancreatic tumor. Consider pancreatic protocol MRI to further work this  up. 3. The peripancreatic lymph node shown above the pancreatic tail is mildly hypermetabolic favoring malignancy. 4.  Prominent stool throughout the colon favors constipation. 5. 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      08/05/2017 Pathology Results    Liver Biopsy  Diagnosis 08/05/17 Liver, needle/core biopsy - CARCINOMA. - SEE COMMENT. Microscopic Comment The malignant cells are positive for cytokeratin 7. They are negative for arginase, CDX2, cytokeratin 20, estrogen receptor, GATA-3, GCDFP, Glypican 3, Hep Par 1, Napsin A, and TTF-1. This immunohistochemical is nonspecific. Possibly primary sources include pancreatobiliary and upper gastrointestinal. Radiologic correlation is necessary. Of note, organ specific markers (GATA-3, GCDFP-breast, TTF-1, Napsin A-lung, and CDX2-colon) are negative. (JBK:ecj 08/09/2017)      08/21/2017 -  Chemotherapy    FOLFIRINOX every 2 weeks starting 08/21/17        10/14/2017 Imaging    CT CAP WO Contrast 10/14/17 IMPRESSION: Evidence of known numerous liver metastases without significant interval change. No other evidence of metastatic disease within the chest, abdomen or pelvis. Cholelithiasis. Tiny pericardial effusion.        HISTORY OF PRESENTING ILLNESS: 07/30/17  Morgan Dawson 57 y.o. 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 The patient was referred by her PCP Dr. [name]. The patient presents to the clinic today accompanied by husband.  Today the patient reports the weekend before Thanksgiving she had abdominal issues with bloating and cramps. These symptoms worsened after she would eat. This persisted past Thanksgiving so she went to her PCP.  She presented to her PCP on 07/20/17 for upper abdominal pain, nausea and bloating. She had lab work up and  RUQ [place] on 07/23/17. Results showed elevated LFTs (alk phos 227, AST 191, ALT 275) and US showed multiple masses in her liver and  pancreatitis. She was put on a bland die due to her pancreatitis. CT AP in 07/23/17 showed diffuse metastatic disease in her liver with no clear primary. She had a PET scan that shows possible metastasis to peripancreatic lymph node along with her known liver masses.   Today she notes she has focused soreness in RUQ. She feels nauseous and has vomited 3 times total in the past 3 weeks. She has lost weight, initially purposefully. She has lost 5-10 pounds. She has been constipated  lately. She takes dulcolax to help. She denies dark stool or GI bleeding. No hematemesis. She does feel more fatigued. She was relatively active 4-5 days a week before last month. She has not needed medication as she feels she has more discomfort (1-2/10). She has not been exercising because she is afraid of making things worse. Her appetite is low from her discomfort and nausea. She notes multiple tender knots on her lower legs that appeared 5 nights ago. There is no itching. She notes she will follow up with Dr. Paulita Fujita this month for her pancreatitis.   Her last mammogram at the [facility] was 04/01/17 and mostly benign. She was recommended to repeat in 6 months. She notes she frequently has cyst in her breast which were benign before.  Socially she is not working. She usually is active in the gym 3-4 days a week. She has 2 children (son and daughter) at 41yo and 33yo.   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 Her last episode of vertigo was a few months ago. She takes a diuretic and a low salt diet. She has jaw surgery for TMJ issues. She had endometrial ablation due to menorrhagia in 2011. She has only spotted since, no full period. Patient had a colonoscopy in 2014 with one benign polyp removed. Her father had colon cancer in his 103s. Her maternal grandfather had colon cancer in his 40s. Paternal aunt had lymphoma.   CURRENT THERAPY: FOLFIRINOX every 2 weeks started 08/21/17   INTERVAL HISTORY Morgan Dawson  is here for a follow up and Cycle 7 FOLFIRINOX. She presents to the clinic today accompanied by her husband. She reports she is doing well considering. She states her last cycle was a little worse than previously. She had abdominal cramping that lasted one day and she didn't take any pain medication for. She also had nausea for one day and continued peripheral neuropathy that resolves after 5-6 days. She does report cold sensitive that lingers. She notes to have some constipation.   On review of systems, pt denies new pain, fever, chills, or any other complaints at this time. Pertinent positives are listed and detailed within the above HPI.   MEDICAL HISTORY:  Past Medical History:  Diagnosis Date  . Meniere disease 2016  . Pancreatitis     SURGICAL HISTORY: Past Surgical History:  Procedure Laterality Date  . CERVICAL ABLATION    . ENDOMETRIAL ABLATION  2011  . IR FLUORO GUIDE PORT INSERTION RIGHT  08/12/2017  . IR US GUIDE VASC ACCESS RIGHT  08/12/2017  . MANDIBLE SURGERY  1999    SOCIAL HISTORY: Social History   Socioeconomic History  . Marital status: Married    Spouse name: Not on file  . Number of children: Not on file  . Years of education: Not on file  .  Highest education level: Not on file  Occupational History  . Not on file  Social Needs  . Financial resource strain: Not on file  . Food insecurity:    Worry: Not on file    Inability: Not on file  . Transportation needs:    Medical: Not on file    Non-medical: Not on file  Tobacco Use  . Smoking status: Never Smoker  . Smokeless tobacco: Never Used  Substance and Sexual Activity  . Alcohol use: No    Frequency: Never  . Drug use: No  . Sexual activity: Not on file  Lifestyle  . Physical activity:    Days per week: Not on file    Minutes per session: Not on file  . Stress: Not on file  Relationships  . Social connections:    Talks on phone: Not on file    Gets together: Not on file    Attends  religious service: Not on file    Active member of club or organization: Not on file    Attends meetings of clubs or organizations: Not on file    Relationship status: Not on file  . Intimate partner violence:    Fear of current or ex partner: Not on file    Emotionally abused: Not on file    Physically abused: Not on file    Forced sexual activity: Not on file  Other Topics Concern  . Not on file  Social History Narrative  . Not on file    FAMILY HISTORY: Family History  Problem Relation Age of Onset  . Colon cancer Father 58       currently 41  . Colon cancer Maternal Grandfather   . Cancer Cousin        breast cancer   . Thyroid cancer Mother 42       facial radiation for acne as teen  . Lymphoma Paternal Aunt 70       deceased 5  . Breast cancer Paternal Aunt     ALLERGIES:  has No Known Allergies.  MEDICATIONS:  Current Outpatient Medications  Medication Sig Dispense Refill  . bisacodyl (DULCOLAX) 5 MG EC tablet Take 5 mg by mouth daily as needed for mild constipation or moderate constipation.    . lidocaine-prilocaine (EMLA) cream Apply to affected area once 30 g 3  . loratadine (CLARITIN) 10 MG tablet Take 10 mg by mouth daily as needed for allergies.    Marland Kitchen meclizine (ANTIVERT) 25 MG tablet Take 25 mg by mouth 3 (three) times daily as needed for dizziness.    . ondansetron (ZOFRAN-ODT) 8 MG disintegrating tablet Take 1 tablet (8 mg total) by mouth every 8 (eight) hours as needed for nausea or vomiting. 40 tablet 2  . potassium chloride (MICRO-K) 10 MEQ CR capsule TAKE 2 CAPSULES(20 MEQ) BY MOUTH TWICE DAILY FOR 5 DAYS THEN TAKE 2 CAPSULES BY MOUTH DAILY 70 capsule 0  . magic mouthwash w/lidocaine SOLN Take 10 mLs by mouth 3 (three) times daily as needed for mouth pain. Swish and spit 10 ML by mouth 3 times daily as needed for mouth pain. (Patient not taking: Reported on 11/15/2017) 240 mL 0  . polyethylene glycol (MIRALAX / GLYCOLAX) packet Take 17 g by mouth daily as  needed for mild constipation.    . prochlorperazine (COMPAZINE) 10 MG tablet Take 1 tablet (10 mg total) by mouth every 6 (six) hours as needed for nausea or vomiting. (Patient not taking: Reported on 11/15/2017) 40  tablet 2  . traMADol (ULTRAM) 50 MG tablet Take 1 tablet (50 mg total) by mouth every 6 (six) hours as needed. (Patient not taking: Reported on 11/15/2017) 30 tablet 0   No current facility-administered medications for this visit.     REVIEW OF SYSTEMS:   Constitutional: Denies fevers, chills or abnormal night sweats (+) fatigue  (+) low appetite (+) weight loss Eyes: Denies blurriness of vision, double vision or watery eyes Ears, nose, mouth, throat, and face: Denies mucositis or sore throat Respiratory: Denies cough, dyspnea or wheezes Cardiovascular: Denies palpitation, chest discomfort or lower extremity swelling Gastrointestinal:  (+) nausea (+) constipation  Skin: Denies abnormal skin rashes (+) tender knots down lower legs bilaterally Lymphatics: Denies new lymphadenopathy or easy bruising Neurological:Denies numbness, tingling or new weaknesses Behavioral/Psych: Mood is stable, no new changes  All other systems were reviewed with the patient and are negative.  PHYSICAL EXAMINATION:  ECOG PERFORMANCE STATUS: 1 - Symptomatic but completely ambulatory Blood pressure 154/100 (repeated 130/84), heart rate 84, respiratory rate 18, temperature 36.6, pulse ox 99% GENERAL:alert, no distress and comfortable SKIN: skin color, texture, turgor are normal, no rashes or significant lesions EYES: normal, conjunctiva are pink and non-injected, sclera clear OROPHARYNX:no exudate, no erythema and lips, buccal mucosa, and tongue normal  NECK: supple, thyroid normal size, non-tender, without nodularity LYMPH:  no palpable lymphadenopathy in the cervical, axillary or inguinal LUNGS: clear to auscultation and percussion with normal breathing effort HEART: regular rate & rhythm and no murmurs  and no lower extremity edema ABDOMEN:abdomen soft, normal bowel sounds. No hepatomegaly or tenderness on palpation (+) small hernia in the left inguial area Musculoskeletal:no cyanosis of digits and no clubbing  PSYCH: alert & oriented x 3 with fluent speech NEURO: no focal motor/sensory deficits  LABORATORY DATA:  I have reviewed the data as listed CBC Latest Ref Rng & Units 11/15/2017 11/01/2017 10/18/2017  WBC 3.9 - 10.3 K/uL 1.6(L) 2.7(L) 3.0(L)  Hemoglobin 11.6 - 15.9 g/dL 9.9(L) 10.5(L) 10.6(L)  Hematocrit 34.8 - 46.6 % 29.7(L) 31.5(L) 31.5(L)  Platelets 145 - 400 K/uL 156 172 191    CMP Latest Ref Rng & Units 11/15/2017 11/01/2017 10/18/2017  Glucose 70 - 140 mg/dL 113 103 106  BUN 7 - 26 mg/dL 13 15 14   Creatinine 0.60 - 1.10 mg/dL 1.88(H) 1.94(H) 1.97(H)  Sodium 136 - 145 mmol/L 139 140 140  Potassium 3.5 - 5.1 mmol/L 3.1(L) 3.4(L) 3.2(L)  Chloride 98 - 109 mmol/L 103 103 103  CO2 22 - 29 mmol/L 27 26 26   Calcium 8.4 - 10.4 mg/dL 9.8 9.9 9.9  Total Protein 6.4 - 8.3 g/dL 7.3 7.4 7.4  Total Bilirubin 0.2 - 1.2 mg/dL 0.7 0.6 0.6  Alkaline Phos 40 - 150 U/L 244(H) 217(H) 215(H)  AST 5 - 34 U/L 67(H) 75(H) 67(H)  ALT 0 - 55 U/L 56(H) 58(H) 47    PATHOLOGY  Liver Biopsy  Diagnosis 08/05/17 Liver, needle/core biopsy - CARCINOMA. - SEE COMMENT. Microscopic Comment The malignant cells are positive for cytokeratin 7. They are negative for arginase, CDX2, cytokeratin 20, estrogen receptor, GATA-3, GCDFP, Glypican 3, Hep Par 1, Napsin A, and TTF-1. This immunohistochemical is nonspecific. Possibly primary sources include pancreatobiliary and upper gastrointestinal. Radiologic correlation is necessary. Of note, organ specific markers (GATA-3, GCDFP-breast, TTF-1, Napsin A-lung, and CDX2-colon) are negative. (JBK:ecj 08/09/2017)   Diagnosis 03/07/13 Surgical, cecum, polyp -TUBULAR ADENOMA -NEGATIVE FOR HIGH GRADE DYSPLASIA   PROCEDURES  Colonoscopy by Dr. Paulita Fujita  03/07/13 IMPRESSION:  -  One 5 m polyp in the cecum. Resected and retrieved -The distal rectum and anal verge are normal on retroflexion view.  -The examination was otherwise normal.    RADIOGRAPHIC STUDIES: I have personally reviewed the radiological images as listed and agreed with the findings in the report. US Renal  Result Date: 11/08/2017 CLINICAL DATA:  Elevated creatinine.  Metastatic pancreatic cancer EXAM: RENAL / URINARY TRACT ULTRASOUND COMPLETE COMPARISON:  CT abdomen 10/14/2017 FINDINGS: Right Kidney: Length: 12.7 cm.  Hyperechoic cortex without hydronephrosis or mass Left Kidney: Length: 11.2 cm.  Hyperechoic cortex without renal obstruction. Liver metastatic disease. Bladder: Appears normal for degree of bladder distention. IMPRESSION: Hyperechoic renal cortex bilaterally without hydronephrosis. Electronically Signed   By: Franchot Gallo M.D.   On: 11/08/2017 16:27   CT CAP WO Contrast 10/14/17 IMPRESSION: Evidence of known numerous liver metastases without significant interval change. No other evidence of metastatic disease within the chest, abdomen or pelvis. Cholelithiasis. Tiny pericardial effusion.   ASSESSMENT & PLAN:  Morgan Dawson is a 57 y.o. caucasian female with a history of vertigo and menieres disease, presented with epigastric discomfort, low appetite and 5 pound weight loss   1. 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  07/29/17 PET scan shows diffuse metastatic disease in her enlarged liver.  This is most consistent with diffuse liver metastasis.  -Her liver biopsy confirmed metastatic adenocarcinoma, most consistent with biliary or pancreatic primary this was discussed with patient.  -After further reviewing her 07/29/17 PET in the GI Tumor Board, the initial uptake in the duodenum is felt to be in the pancrease tail, which represents  the primary tumor.  The consensus from GI tumor board is this is most consistent with metastatic pancreatic adenocarcinoma to liver.  We felt she does not need further imaging or EUS to confirm the primary site.  -Given her metastatic stage IV disease, her metastatic cancer is unfortunately not curable, and the goal of therapy is palliative, to prolong her life and improve her quality of life.  -Goal of therapy is to control the disease.  -she started first line FOLFIRINOX on 08/21/17, -She has been tolerating chemotherapy moderately well, with some nausea, cold sensitivity, diarrhea, and fatigue, she has developed slightly worsening renal function, has received IV fluids intermittent -CT CAP WO Contrast from 10/14/17 reveals stable disease, liver metastases without significant interval change. No other evidence of metastatic disease within the chest, abdomen or pelvis. I discussed results with pt. due to her chronic kidney disease, she is not able to receive IV contrast, which limits the quality of CT,  I may consider PET scan in the future.  -Her CA 19.9 continues to decrease. This is a good sign that her regimen is working to control her disease. Plan to scan after 2 more cycles. PET Scan in 1 month  -Labs reviewed, her ANC is 0.3. I will hold [place] today and postpone for a week.  I will add Neulasta back on day 3 from next cycle -F/u in 3 weeks    2. Pancreatitis -Found by 07/23/17 CT scan, likely related to her cancer.  -On bland diet -I previously suggested Prilosec and to continue small meals with bland diet. If needed I can give pain medication. -Abdominal pain is mild, no need for pain medication at this time. She was taking tylenol up to TID.   3. Nausea and  Vomiting  -She has had nausea since her cancer diagnosis, she knows to use Zofran and Compazine alternatively for nausea as needed off chemo -She had severe nausea and vomiting towards the end of the chemo infusion cycle 2 -Emend  was added with cycle 3, her n/v are overall improved from previous cycles. She had 2 days of mild n/v after pump d/c, managed with zofran and compazine. She will continue current supportive regimen. Will monitor.   4. Anorexia, weight loss -She has anxiety about eating due to emesis.  -I previously encouraged her to eat more small meals with bland or liquid diet. She can increase her intake of ensure boost -Follow-up with dietitian -weight stable lately   5. Hypercalcemia -Secondary to her underlying malignancy -Received Zometa and IV hydration in December 2018, hypercalcemia has resolved now. -Okay to restart dairy products, she is off calcium supplement. -She has cramps as of late, her Ca is 9.9 on 2/25 and 3/18, okay to restart calcium supplement for leg cramps. She can also take magnesium.  6.  Transaminitis -Due to her underlying liver metastasis -Improved since she started chemo -Monitoring  7. Goal of care discussion  -We previously discussed the incurable nature of her cancer, and the overall poor prognosis, especially if she does not have good response to chemotherapy or progress on chemo -The patient understands the goal of care is palliative. -I previously recommended DNR/DNI, she will think about it   8. Hypokalemia, CKD stage III -She takes 10-40 mEq on a given day depending on her ability to swallow pills. K was 3.0 10/04/17. She does not have heavy GI output. I recommended she take 20 mEq daily consistently.  -Creatinine continues to increase; she appears adequately hydrated. Could be related to chemotherapy. Will check mag to make sure it is normal. Will obtain uric acid to r/o tumor lysis.  -will check labs closely, add on with pump d/c on day 3 -her Cr is 1.97 on (10/18/17), so she received IVF, her K was 3.2, She can increase to 3 potassium supplements for the next 3 days.  -Cr remains presistently high, I stopped her spironolactone on 11/01/17 - US renal from 11/08/17  revealed hyperechoic renal cortex bilaterally without hydronephrosis -K is 3.1 today (11/15/17) she will increase K supplement to three times daily  9. Meniere's disease -She had recent meniere's flare with dizziness since last cycle. She is not currently on daily treatment; takes meclizine PRN. I previously discussed with ENT, she was prescribed aldactone. She has been holding due to increased creatinine.   10. Genetics -She previously met with genetic counselor and test was not recommended  -I informed pt today that testing will be beneficial to know if she will be a candidate for other drugs in the future if she is tested positive for Lynch Syndrome or BRCA1/BRCA2 mutation carrier  -She is agreeable to be tested, I will arrange    PLAN:  -Genetic lab test -Increase K supplement to 3 times daily -ANC is 0.3 today, hold chemo, return for chemo in 1 week -Lab and f/u in 3 weeks  -PET Scan in 1 month   No orders of the defined types were placed in this encounter.   All questions were answered. The patient knows to call the clinic with any problems, questions or concerns. I spent 20 minutes counseling the patient face to face. The total time spent in the appointment was 25 minutes and more than 50% was on counseling.  This document serves  as a record of services personally performed by [name], MD. It was created on her behalf by Theresia Bough, a trained medical scribe. The creation of this record is based on the scribe's personal observations and the provider's statements to them.   I have reviewed the above documentation for accuracy and completeness, and I agree with the above.    [name], MD 11/15/2017 5:05 PM

## 2017-11-14 DIAGNOSIS — N183 Chronic kidney disease, stage 3 unspecified: Secondary | ICD-10-CM | POA: Insufficient documentation

## 2017-11-14 DIAGNOSIS — D6481 Anemia due to antineoplastic chemotherapy: Secondary | ICD-10-CM | POA: Insufficient documentation

## 2017-11-14 DIAGNOSIS — T451X5A Adverse effect of antineoplastic and immunosuppressive drugs, initial encounter: Secondary | ICD-10-CM

## 2017-11-15 ENCOUNTER — Inpatient Hospital Stay: Payer: 59

## 2017-11-15 ENCOUNTER — Inpatient Hospital Stay (HOSPITAL_BASED_OUTPATIENT_CLINIC_OR_DEPARTMENT_OTHER): Payer: 59 | Admitting: Hematology

## 2017-11-15 ENCOUNTER — Encounter: Payer: Self-pay | Admitting: Hematology

## 2017-11-15 VITALS — BP 148/89 | HR 86 | Temp 98.4°F | Resp 18 | Ht 65.0 in | Wt 110.9 lb

## 2017-11-15 DIAGNOSIS — T451X5A Adverse effect of antineoplastic and immunosuppressive drugs, initial encounter: Secondary | ICD-10-CM

## 2017-11-15 DIAGNOSIS — N183 Chronic kidney disease, stage 3 unspecified: Secondary | ICD-10-CM

## 2017-11-15 DIAGNOSIS — C259 Malignant neoplasm of pancreas, unspecified: Secondary | ICD-10-CM

## 2017-11-15 DIAGNOSIS — C251 Malignant neoplasm of body of pancreas: Secondary | ICD-10-CM

## 2017-11-15 DIAGNOSIS — H8109 Meniere's disease, unspecified ear: Secondary | ICD-10-CM

## 2017-11-15 DIAGNOSIS — D6481 Anemia due to antineoplastic chemotherapy: Secondary | ICD-10-CM

## 2017-11-15 DIAGNOSIS — R634 Abnormal weight loss: Secondary | ICD-10-CM | POA: Diagnosis not present

## 2017-11-15 DIAGNOSIS — R630 Anorexia: Secondary | ICD-10-CM

## 2017-11-15 DIAGNOSIS — R740 Nonspecific elevation of levels of transaminase and lactic acid dehydrogenase [LDH]: Secondary | ICD-10-CM | POA: Diagnosis not present

## 2017-11-15 DIAGNOSIS — C787 Secondary malignant neoplasm of liver and intrahepatic bile duct: Secondary | ICD-10-CM

## 2017-11-15 DIAGNOSIS — E876 Hypokalemia: Secondary | ICD-10-CM | POA: Diagnosis not present

## 2017-11-15 DIAGNOSIS — R109 Unspecified abdominal pain: Secondary | ICD-10-CM | POA: Diagnosis not present

## 2017-11-15 DIAGNOSIS — R112 Nausea with vomiting, unspecified: Secondary | ICD-10-CM | POA: Diagnosis not present

## 2017-11-15 DIAGNOSIS — Z5111 Encounter for antineoplastic chemotherapy: Secondary | ICD-10-CM | POA: Diagnosis not present

## 2017-11-15 DIAGNOSIS — Z95828 Presence of other vascular implants and grafts: Secondary | ICD-10-CM

## 2017-11-15 LAB — COMPREHENSIVE METABOLIC PANEL
ALK PHOS: 244 U/L — AB (ref 40–150)
ALT: 56 U/L — AB (ref 0–55)
AST: 67 U/L — ABNORMAL HIGH (ref 5–34)
Albumin: 3.4 g/dL — ABNORMAL LOW (ref 3.5–5.0)
Anion gap: 9 (ref 3–11)
BUN: 13 mg/dL (ref 7–26)
CALCIUM: 9.8 mg/dL (ref 8.4–10.4)
CHLORIDE: 103 mmol/L (ref 98–109)
CO2: 27 mmol/L (ref 22–29)
CREATININE: 1.88 mg/dL — AB (ref 0.60–1.10)
GFR calc Af Amer: 34 mL/min — ABNORMAL LOW (ref >60)
GFR, EST NON AFRICAN AMERICAN: 29 mL/min — AB (ref >60)
Glucose, Bld: 113 mg/dL (ref 70–140)
Potassium: 3.1 mmol/L — ABNORMAL LOW (ref 3.5–5.1)
Sodium: 139 mmol/L (ref 136–145)
Total Bilirubin: 0.7 mg/dL (ref 0.2–1.2)
Total Protein: 7.3 g/dL (ref 6.4–8.3)

## 2017-11-15 LAB — CBC WITH DIFFERENTIAL/PLATELET
Basophils Absolute: 0 10*3/uL (ref 0.0–0.1)
Basophils Relative: 2 %
EOS PCT: 3 %
Eosinophils Absolute: 0 10*3/uL (ref 0.0–0.5)
HEMATOCRIT: 29.7 % — AB (ref 34.8–46.6)
HEMOGLOBIN: 9.9 g/dL — AB (ref 11.6–15.9)
LYMPHS ABS: 0.7 10*3/uL — AB (ref 0.9–3.3)
LYMPHS PCT: 41 %
MCH: 33.8 pg (ref 25.1–34.0)
MCHC: 33.3 g/dL (ref 31.5–36.0)
MCV: 101.4 fL — AB (ref 79.5–101.0)
Monocytes Absolute: 0.5 10*3/uL (ref 0.1–0.9)
Monocytes Relative: 32 %
NEUTROS PCT: 22 %
Neutro Abs: 0.3 10*3/uL — CL (ref 1.5–6.5)
PLATELETS: 156 10*3/uL (ref 145–400)
RBC: 2.93 MIL/uL — AB (ref 3.70–5.45)
RDW: 19 % — ABNORMAL HIGH (ref 11.2–14.5)
WBC: 1.6 10*3/uL — AB (ref 3.9–10.3)

## 2017-11-15 LAB — LIPASE, BLOOD: Lipase: 152 U/L — ABNORMAL HIGH (ref 11–51)

## 2017-11-15 MED ORDER — SODIUM CHLORIDE 0.9 % IJ SOLN
10.0000 mL | Freq: Once | INTRAMUSCULAR | Status: AC
  Administered 2017-11-15: 10 mL
  Filled 2017-11-15: qty 10

## 2017-11-15 MED ORDER — HEPARIN SOD (PORK) LOCK FLUSH 100 UNIT/ML IV SOLN
500.0000 [IU] | Freq: Once | INTRAVENOUS | Status: AC
  Administered 2017-11-15: 500 [IU]
  Filled 2017-11-15: qty 5

## 2017-11-15 MED ORDER — SODIUM CHLORIDE 0.9% FLUSH
10.0000 mL | Freq: Once | INTRAVENOUS | Status: AC
  Administered 2017-11-15: 10 mL
  Filled 2017-11-15: qty 10

## 2017-11-15 NOTE — Addendum Note (Signed)
Addended by: [name] on: 11/15/2017 11:43 PM   Modules accepted: Orders

## 2017-11-15 NOTE — Addendum Note (Signed)
Addended by: [name] on: 11/15/2017 05:44 PM   Modules accepted: Orders

## 2017-11-16 LAB — CANCER ANTIGEN 19-9: CA 19-9: 33 U/mL (ref 0–35)

## 2017-11-18 ENCOUNTER — Encounter: Payer: Self-pay | Admitting: Genetic Counselor

## 2017-11-18 ENCOUNTER — Other Ambulatory Visit: Payer: Self-pay | Admitting: *Deleted

## 2017-11-18 DIAGNOSIS — E876 Hypokalemia: Secondary | ICD-10-CM

## 2017-11-18 MED ORDER — POTASSIUM CHLORIDE ER 10 MEQ PO CPCR: ORAL_CAPSULE | ORAL | 1 refills | Status: DC

## 2017-11-18 NOTE — Progress Notes (Signed)
[facility]      Initial Visit   Patient Name: Morgan Dawson Patient DOB: 25-Nov-1960 Patient Age: 57 y.o. Encounter Date: 09/23/2017  Referring Provider: [name], MD  Primary Care Provider: [name], MD  Reason for Visit: Evaluate for hereditary susceptibility to cancer    Assessment and Plan:  . Ms. Banh's family history is not suggestive of a hereditary predisposition to cancer. However, given her own personal history of pancreatic cancer, a genetics evaluation is indicated as it may guide treatment decisions.  . Testing is recommended to determine whether she has a pathogenic mutation that will impact her screening and risk-reduction for cancer. A negative result will be reassuring.  . Ms. Gluth did not wish to pursue genetic testing today. We continue to recommend testing, and remain available to coordinate testing at any time in the future.    . Ms. Rosete is encouraged to remain in contact with Cancer Genetics annually so that we can update the family history and inform her of any changes in cancer genetics and testing that may be of benefit for this family.    Dr. Burr Medico was available for questions concerning this case. Total time spent by me in face-to-face counseling was approximately 30 minutes.   _____________________________________________________________________   History of Present Illness: Ms. Morgan Dawson, a 57 y.o. female, is being seen at the [facility] due to a personal and family history of cancer. She presents to clinic today to discuss the possibility of a hereditary predisposition to cancer and discuss whether genetic testing is warranted.   Oncology History   Cancer Staging Pancreatic cancer [facility]) Staging form: Exocrine Pancreas, AJCC 8th Edition - Clinical stage from 08/06/2017: Stage IV (cTX, cN0, pM1) - Signed by [name], MD on 08/12/2017       Pancreatic cancer ([place])   07/23/2017 Imaging    US abdomen limited RUQ 07/23/17 IMPRESSION: 1. Cholelithiasis.  No secondary signs of acute cholecystitis. 2. Multiple solid liver masses measuring up to 6.5 cm in the left lobe of the liver, evaluation for metastatic disease is recommended. These results will be called to the ordering clinician or representative by the Radiologist Assistant, and communication documented in the PACS or zVision Dashboard.      07/23/2017 Imaging    CT Abdomen W Contrast 07/23/17 IMPRESSION: 1. Widespread metastatic disease throughout the liver. No clear primary malignancy identified in the abdomen. The pelvis was not imaged. Tissue sampling recommended. 2. Probable adenopathy superior to the pancreatic tail. No evidence of pancreatic mass. 3. Suspected incidental hemangioma inferiorly in the right hepatic lobe. 4. Nonspecific nodularity in the breasts. The patient has undergone recent (03/25/2017 and 04/01/2017) mammography and ultrasound.      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 Numerous bulky liver masses are hypermetabolic compatible with malignancy. 2. Accentuated activity within or along the pancreatic tail, likely represent a primary pancreatic tumor. Consider pancreatic protocol MRI to further work this up. 3. The peripancreatic lymph node shown above the pancreatic tail is mildly hypermetabolic favoring malignancy. 4.  Prominent stool throughout the colon favors constipation. 5. 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      08/05/2017 Pathology Results    Liver Biopsy  Diagnosis 08/05/17 Liver, needle/core biopsy - CARCINOMA. - SEE COMMENT. Microscopic Comment The malignant cells are  positive for cytokeratin 7. They are negative for arginase, CDX2, cytokeratin 20, estrogen receptor, GATA-3, GCDFP, Glypican 3, Hep Par 1, Napsin A, and TTF-1. This immunohistochemical  is nonspecific. Possibly primary sources include pancreatobiliary and upper gastrointestinal. Radiologic correlation is necessary. Of note, organ specific markers (GATA-3, GCDFP-breast, TTF-1, Napsin A-lung, and CDX2-colon) are negative. (JBK:ecj 08/09/2017)      08/21/2017 -  Chemotherapy    FOLFIRINOX every 2 weeks starting 08/21/17        10/14/2017 Imaging    CT CAP WO Contrast 10/14/17 IMPRESSION: Evidence of known numerous liver metastases without significant interval change. No other evidence of metastatic disease within the chest, abdomen or pelvis. Cholelithiasis. Tiny pericardial effusion.       Past Medical History:  Diagnosis Date  . Meniere disease 2016  . Pancreatitis     Past Surgical History:  Procedure Laterality Date  . CERVICAL ABLATION    . ENDOMETRIAL ABLATION  2011  . IR FLUORO GUIDE PORT INSERTION RIGHT  08/12/2017  . IR US GUIDE VASC ACCESS RIGHT  08/12/2017  . MANDIBLE SURGERY  1999    Social History   Socioeconomic History  . Marital status: Married    Spouse name: Not on file  . Number of children: Not on file  . Years of education: Not on file  . Highest education level: Not on file  Occupational History  . Not on file  Social Needs  . Financial resource strain: Not on file  . Food insecurity:    Worry: Not on file    Inability: Not on file  . Transportation needs:    Medical: Not on file    Non-medical: Not on file  Tobacco Use  . Smoking status: Never Smoker  . Smokeless tobacco: Never Used  Substance and Sexual Activity  . Alcohol use: No    Frequency: Never  . Drug use: No  . Sexual activity: Not on file  Lifestyle  . Physical activity:    Days per week: Not on file    Minutes per session: Not on file  . Stress: Not on file  Relationships  . Social connections:    Talks on phone: Not on file    Gets together: Not on file    Attends religious service: Not on file    Active member of club or organization: Not on  file    Attends meetings of clubs or organizations: Not on file    Relationship status: Not on file  Other Topics Concern  . Not on file  Social History Narrative  . Not on file     Family History:  During the visit, a 4-generation pedigree was obtained. Family tree will be scanned in the Media tab in Epic  Significant diagnoses include the following:  Family History  Problem Relation Age of Onset  . Colon cancer Father 78       currently 78  . Colon cancer Maternal Grandfather   . Breast cancer Cousin   . Thyroid cancer Mother 33       facial radiation for acne as teen  . Lymphoma Paternal Aunt 31       deceased 98  . Breast cancer Paternal Aunt     Additionally, Ms. Mccarron has 2 sons (ages 38 and 53). She has no siblings. Her mother has one brother (age 9). Her father had a brother and 2 sisters.  Ms. Gau ancestry is Caucasian - NOS. There is no known Jewish ancestry and no  consanguinity.  Discussion: We reviewed the characteristics, features and inheritance patterns of hereditary cancer syndromes. We discussed her risk of harboring a mutation in the context of her personal and family history. We discussed the process of genetic testing, insurance coverage and implications of results: positive, negative and variant of unknown significance (VUS).    Ms. Buster questions were answered to her satisfaction today and she is welcome to call with any additional questions or concerns. Thank you for the referral and allowing [place] to share in the care of your patient.    Steele Berg, MS, [place] Certified Genetic Counselor phone: [phone] [email or web address]

## 2017-11-18 NOTE — Progress Notes (Signed)
  Oncology Nurse Navigator Documentation  Navigator Location: [facility] (11/18/17 4259)   )Navigator Encounter Type: Telephone (11/18/17 5638) Telephone: Incoming Call;Outgoing Call (11/18/17 7564)    Patient called requesting a refill on her Potassium. Clinic nurse made aware.  I                    Interventions: Medication Assistance (11/18/17 0904)            Acuity: Level 2 (11/18/17 0904)         Time Spent with Patient: 15 (11/18/17 0904)

## 2017-11-21 DIAGNOSIS — C259 Malignant neoplasm of pancreas, unspecified: Secondary | ICD-10-CM | POA: Diagnosis not present

## 2017-11-22 ENCOUNTER — Encounter: Payer: 59 | Admitting: Genetics

## 2017-11-22 ENCOUNTER — Other Ambulatory Visit: Payer: Self-pay | Admitting: Hematology

## 2017-11-22 ENCOUNTER — Inpatient Hospital Stay: Payer: 59 | Attending: Hematology

## 2017-11-22 ENCOUNTER — Inpatient Hospital Stay: Payer: 59

## 2017-11-22 VITALS — BP 134/84 | HR 75 | Temp 98.9°F | Resp 16

## 2017-11-22 DIAGNOSIS — Z5111 Encounter for antineoplastic chemotherapy: Secondary | ICD-10-CM | POA: Diagnosis present

## 2017-11-22 DIAGNOSIS — Z800 Family history of malignant neoplasm of digestive organs: Secondary | ICD-10-CM | POA: Diagnosis not present

## 2017-11-22 DIAGNOSIS — Z5189 Encounter for other specified aftercare: Secondary | ICD-10-CM | POA: Insufficient documentation

## 2017-11-22 DIAGNOSIS — C259 Malignant neoplasm of pancreas, unspecified: Secondary | ICD-10-CM | POA: Insufficient documentation

## 2017-11-22 DIAGNOSIS — E876 Hypokalemia: Secondary | ICD-10-CM

## 2017-11-22 DIAGNOSIS — Z7189 Other specified counseling: Secondary | ICD-10-CM

## 2017-11-22 DIAGNOSIS — C787 Secondary malignant neoplasm of liver and intrahepatic bile duct: Secondary | ICD-10-CM | POA: Insufficient documentation

## 2017-11-22 DIAGNOSIS — C251 Malignant neoplasm of body of pancreas: Secondary | ICD-10-CM

## 2017-11-22 LAB — COMPREHENSIVE METABOLIC PANEL
ALBUMIN: 3.4 g/dL — AB (ref 3.5–5.0)
ALT: 54 U/L (ref 0–55)
ANION GAP: 11 (ref 3–11)
AST: 69 U/L — ABNORMAL HIGH (ref 5–34)
Alkaline Phosphatase: 278 U/L — ABNORMAL HIGH (ref 40–150)
BUN: 14 mg/dL (ref 7–26)
CHLORIDE: 103 mmol/L (ref 98–109)
CO2: 27 mmol/L (ref 22–29)
Calcium: 9.9 mg/dL (ref 8.4–10.4)
Creatinine, Ser: 2.04 mg/dL — ABNORMAL HIGH (ref 0.60–1.10)
GFR calc Af Amer: 31 mL/min — ABNORMAL LOW (ref >60)
GFR calc non Af Amer: 26 mL/min — ABNORMAL LOW (ref >60)
GLUCOSE: 116 mg/dL (ref 70–140)
POTASSIUM: 2.9 mmol/L — AB (ref 3.5–5.1)
SODIUM: 141 mmol/L (ref 136–145)
Total Bilirubin: 0.5 mg/dL (ref 0.2–1.2)
Total Protein: 7.4 g/dL (ref 6.4–8.3)

## 2017-11-22 LAB — CBC WITH DIFFERENTIAL/PLATELET
BASOS ABS: 0.1 10*3/uL (ref 0.0–0.1)
BASOS PCT: 2 %
EOS ABS: 0.1 10*3/uL (ref 0.0–0.5)
EOS PCT: 2 %
HCT: 32.2 % — ABNORMAL LOW (ref 34.8–46.6)
Hemoglobin: 10.9 g/dL — ABNORMAL LOW (ref 11.6–15.9)
Lymphocytes Relative: 29 %
Lymphs Abs: 0.8 10*3/uL — ABNORMAL LOW (ref 0.9–3.3)
MCH: 34.8 pg — ABNORMAL HIGH (ref 25.1–34.0)
MCHC: 33.7 g/dL (ref 31.5–36.0)
MCV: 103.2 fL — ABNORMAL HIGH (ref 79.5–101.0)
MONO ABS: 0.7 10*3/uL (ref 0.1–0.9)
Monocytes Relative: 26 %
NEUTROS ABS: 1.2 10*3/uL — AB (ref 1.5–6.5)
Neutrophils Relative %: 41 %
PLATELETS: 170 10*3/uL (ref 145–400)
RBC: 3.12 MIL/uL — ABNORMAL LOW (ref 3.70–5.45)
RDW: 18.9 % — AB (ref 11.2–14.5)
WBC: 2.9 10*3/uL — ABNORMAL LOW (ref 3.9–10.3)

## 2017-11-22 MED ORDER — POTASSIUM CHLORIDE CRYS ER 10 MEQ PO TBCR
20.0000 meq | EXTENDED_RELEASE_TABLET | Freq: Once | ORAL | Status: AC
  Administered 2017-11-22: 20 meq via ORAL
  Filled 2017-11-22: qty 2

## 2017-11-22 MED ORDER — LEUCOVORIN CALCIUM INJECTION 350 MG
400.0000 mg/m2 | Freq: Once | INTRAVENOUS | Status: AC
  Administered 2017-11-22: 668 mg via INTRAVENOUS
  Filled 2017-11-22: qty 33.4

## 2017-11-22 MED ORDER — DEXTROSE 5 % IV SOLN
Freq: Once | INTRAVENOUS | Status: AC
  Administered 2017-11-22: 10:00:00 via INTRAVENOUS

## 2017-11-22 MED ORDER — PALONOSETRON HCL INJECTION 0.25 MG/5ML
INTRAVENOUS | Status: AC
Start: 2017-11-22 — End: ?
  Filled 2017-11-22: qty 5

## 2017-11-22 MED ORDER — DEXTROSE 5 % IV SOLN
140.0000 mg/m2 | Freq: Once | INTRAVENOUS | Status: AC
  Administered 2017-11-22: 240 mg via INTRAVENOUS
  Filled 2017-11-22: qty 12

## 2017-11-22 MED ORDER — SODIUM CHLORIDE 0.9 % IV SOLN
2200.0000 mg/m2 | INTRAVENOUS | Status: DC
  Administered 2017-11-22: 3650 mg via INTRAVENOUS
  Filled 2017-11-22: qty 73

## 2017-11-22 MED ORDER — DEXTROSE 5 % IV SOLN
70.0000 mg/m2 | Freq: Once | INTRAVENOUS | Status: AC
  Administered 2017-11-22: 115 mg via INTRAVENOUS
  Filled 2017-11-22: qty 20

## 2017-11-22 MED ORDER — ATROPINE SULFATE 1 MG/ML IJ SOLN
INTRAMUSCULAR | Status: AC
  Filled 2017-11-22: qty 1

## 2017-11-22 MED ORDER — PALONOSETRON HCL INJECTION 0.25 MG/5ML
0.2500 mg | Freq: Once | INTRAVENOUS | Status: AC
  Administered 2017-11-22: 0.25 mg via INTRAVENOUS

## 2017-11-22 MED ORDER — ATROPINE SULFATE 1 MG/ML IJ SOLN
0.5000 mg | Freq: Once | INTRAMUSCULAR | Status: AC | PRN
  Administered 2017-11-22: 0.5 mg via INTRAVENOUS

## 2017-11-22 MED ORDER — FOSAPREPITANT DIMEGLUMINE INJECTION 150 MG
Freq: Once | INTRAVENOUS | Status: AC
  Administered 2017-11-22: 11:00:00 via INTRAVENOUS
  Filled 2017-11-22: qty 5

## 2017-11-22 NOTE — Patient Instructions (Signed)
[facility] Discharge Instructions for Patients Receiving Chemotherapy  Today you received the following chemotherapy agents Oxaliplatin, Leucovorin, Irinotecan, and 5FU  To help prevent nausea and vomiting after your treatment, we encourage you to take your nausea medication as directed   If you develop nausea and vomiting that is not controlled by your nausea medication, call the clinic.   BELOW ARE SYMPTOMS THAT SHOULD BE REPORTED IMMEDIATELY:  *FEVER GREATER THAN 100.5 F  *CHILLS WITH OR WITHOUT FEVER  NAUSEA AND VOMITING THAT IS NOT CONTROLLED WITH YOUR NAUSEA MEDICATION  *UNUSUAL SHORTNESS OF BREATH  *UNUSUAL BRUISING OR BLEEDING  TENDERNESS IN MOUTH AND THROAT WITH OR WITHOUT PRESENCE OF ULCERS  *URINARY PROBLEMS  *BOWEL PROBLEMS  UNUSUAL RASH Items with * indicate a potential emergency and should be followed up as soon as possible.  Feel free to call the clinic should you have any questions or concerns. The clinic phone number is [phone].  Please show the CHEMO ALERT CARD at check-in to the Emergency Department and triage nurse.   

## 2017-11-22 NOTE — Progress Notes (Signed)
Dr. Burr Medico reviewed all lab results today.  OK to treat as per Dr. Burr Medico.   Pt to receive Neulasta with pump d/c.

## 2017-11-24 ENCOUNTER — Inpatient Hospital Stay: Payer: 59

## 2017-11-24 VITALS — BP 132/84 | HR 80 | Temp 98.6°F | Resp 18

## 2017-11-24 DIAGNOSIS — Z7189 Other specified counseling: Secondary | ICD-10-CM

## 2017-11-24 DIAGNOSIS — C787 Secondary malignant neoplasm of liver and intrahepatic bile duct: Principal | ICD-10-CM

## 2017-11-24 DIAGNOSIS — Z5111 Encounter for antineoplastic chemotherapy: Secondary | ICD-10-CM | POA: Diagnosis not present

## 2017-11-24 DIAGNOSIS — C259 Malignant neoplasm of pancreas, unspecified: Secondary | ICD-10-CM

## 2017-11-24 MED ORDER — PEGFILGRASTIM INJECTION 6 MG/0.6ML ~~LOC~~
6.0000 mg | PREFILLED_SYRINGE | Freq: Once | SUBCUTANEOUS | Status: AC
Start: 2017-11-24 — End: 2017-11-24
  Administered 2017-11-24: 6 mg via SUBCUTANEOUS

## 2017-11-24 MED ORDER — PEGFILGRASTIM INJECTION 6 MG/0.6ML ~~LOC~~
PREFILLED_SYRINGE | SUBCUTANEOUS | Status: AC
  Filled 2017-11-24: qty 0.6

## 2017-11-24 MED ORDER — HEPARIN SOD (PORK) LOCK FLUSH 100 UNIT/ML IV SOLN
500.0000 [IU] | Freq: Once | INTRAVENOUS | Status: AC | PRN
  Administered 2017-11-24: 500 [IU]
  Filled 2017-11-24: qty 5

## 2017-11-24 MED ORDER — SODIUM CHLORIDE 0.9% FLUSH
10.0000 mL | INTRAVENOUS | Status: DC | PRN
  Administered 2017-11-24: 10 mL
  Filled 2017-11-24: qty 10

## 2017-11-24 NOTE — Patient Instructions (Signed)
Pegfilgrastim injection What is this medicine? PEGFILGRASTIM (PEG fil gra stim) is a long-acting granulocyte colony-stimulating factor that stimulates the growth of neutrophils, a type of white blood cell important in the body's fight against infection. It is used to reduce the incidence of fever and infection in patients with certain types of cancer who are receiving chemotherapy that affects the bone marrow, and to increase survival after being exposed to high doses of radiation. This medicine may be used for other purposes; ask your health care provider or pharmacist if you have questions. COMMON BRAND NAME(S): Neulasta What should I tell my health care provider before I take this medicine? They need to know if you have any of these conditions: -kidney disease -latex allergy -ongoing radiation therapy -sickle cell disease -skin reactions to acrylic adhesives (On-Body Injector only) -an unusual or allergic reaction to pegfilgrastim, filgrastim, other medicines, foods, dyes, or preservatives -pregnant or trying to get pregnant -breast-feeding How should I use this medicine? This medicine is for injection under the skin. If you get this medicine at home, you will be taught how to prepare and give the pre-filled syringe or how to use the On-body Injector. Refer to the patient Instructions for Use for detailed instructions. Use exactly as directed. Tell your healthcare provider immediately if you suspect that the On-body Injector may not have performed as intended or if you suspect the use of the On-body Injector resulted in a missed or partial dose. It is important that you put your used needles and syringes in a special sharps container. Do not put them in a trash can. If you do not have a sharps container, call your pharmacist or healthcare provider to get one. Talk to your pediatrician regarding the use of this medicine in children. While this drug may be prescribed for selected conditions,  precautions do apply. Overdosage: If you think you have taken too much of this medicine contact a poison control center or emergency room at once. NOTE: This medicine is only for you. Do not share this medicine with others. What if I miss a dose? It is important not to miss your dose. Call your doctor or health care professional if you miss your dose. If you miss a dose due to an On-body Injector failure or leakage, a new dose should be administered as soon as possible using a single prefilled syringe for manual use. What may interact with this medicine? Interactions have not been studied. Give your health care provider a list of all the medicines, herbs, non-prescription drugs, or dietary supplements you use. Also tell them if you smoke, drink alcohol, or use illegal drugs. Some items may interact with your medicine. This list may not describe all possible interactions. Give your health care provider a list of all the medicines, herbs, non-prescription drugs, or dietary supplements you use. Also tell them if you smoke, drink alcohol, or use illegal drugs. Some items may interact with your medicine. What should I watch for while using this medicine? You may need blood work done while you are taking this medicine. If you are going to need a MRI, CT scan, or other procedure, tell your doctor that you are using this medicine (On-Body Injector only). What side effects may I notice from receiving this medicine? Side effects that you should report to your doctor or health care professional as soon as possible: -allergic reactions like skin rash, itching or hives, swelling of the face, lips, or tongue -dizziness -fever -pain, redness, or irritation at site   where injected -pinpoint red spots on the skin -red or dark-brown urine -shortness of breath or breathing problems -stomach or side pain, or pain at the shoulder -swelling -tiredness -trouble passing urine or change in the amount of urine Side  effects that usually do not require medical attention (report to your doctor or health care professional if they continue or are bothersome): -bone pain -muscle pain This list may not describe all possible side effects. Call your doctor for medical advice about side effects. You may report side effects to FDA at 1-800-FDA-1088. Where should I keep my medicine? Keep out of the reach of children. Store pre-filled syringes in a refrigerator between 2 and 8 degrees C (36 and 46 degrees F). Do not freeze. Keep in carton to protect from light. Throw away this medicine if it is left out of the refrigerator for more than 48 hours. Throw away any unused medicine after the expiration date. NOTE: This sheet is a summary. It may not cover all possible information. If you have questions about this medicine, talk to your doctor, pharmacist, or health care provider.  2018 Elsevier/Gold Standard (2016-08-06 12:58:03)  

## 2017-11-29 ENCOUNTER — Ambulatory Visit: Payer: 59

## 2017-11-29 ENCOUNTER — Ambulatory Visit: Payer: 59 | Admitting: Hematology

## 2017-11-29 ENCOUNTER — Other Ambulatory Visit: Payer: 59

## 2017-11-29 ENCOUNTER — Encounter: Payer: 59 | Admitting: Nutrition

## 2017-12-02 ENCOUNTER — Ambulatory Visit: Payer: Self-pay | Admitting: Genetic Counselor

## 2017-12-02 ENCOUNTER — Encounter: Payer: Self-pay | Admitting: Genetic Counselor

## 2017-12-02 DIAGNOSIS — Z1379 Encounter for other screening for genetic and chromosomal anomalies: Secondary | ICD-10-CM

## 2017-12-02 HISTORY — DX: Encounter for other screening for genetic and chromosomal anomalies: Z13.79

## 2017-12-02 NOTE — Progress Notes (Signed)
[facility]       Genetic Test Results    Patient Name: Morgan Dawson Patient DOB: 10-26-1960 Patient Age: 57 y.o. Encounter Date: 12/02/2017  Referring Provider: [name], MD  Primary Care Provider: [name], MD   Ms. Ollis was called today to discuss genetic test results. Please see the Genetics note from her visit on  for a detailed discussion of her personal and family history.  Genetic Testing: At the time of Ms. Hegler's visit, she decided to pursue genetic testing of multiple genes associated with hereditary susceptibility to cancer. Testing included sequencing and deletion/duplication analysis. Testing did not reveal a pathogenic mutation in any of the genes analyzed.  A copy of the genetic test report will be scanned into Epic under the Media tab.  The genes analyzed were the 83 genes on [facility]'s Multi-Cancer panel (ALK, APC, ATM, AXIN2, BAP1, BARD1, BLM, BMPR1A, BRCA1, BRCA2, BRIP1, CASR, CDC73, CDH1, CDK4, CDKN1B, CDKN1C, CDKN2A, CEBPA, CHEK2, CTNNA1, DICER1, DIS3L2, EGFR, EPCAM, FH, FLCN, GATA2, GPC3, GREM1, HOXB13, HRAS, KIT, MAX, MEN1, MET, MITF, MLH1, MSH2, MSH3, MSH6, MUTYH, NBN, NF1, NF2, NTHL1, PALB2, PDGFRA, PHOX2B, PMS2, POLD1, POLE, POT1, PRKAR1A, PTCH1, PTEN, RAD50, RAD51C, RAD51D, RB1, RECQL4, RET, RUNX1, SDHA, SDHAF2, SDHB, SDHC, SDHD, SMAD4, SMARCA4, SMARCB1, SMARCE1, STK11, SUFU, TERC, TERT, TMEM127, TP53, TSC1, TSC2, VHL, WRN, WT1).  Since the current test is not perfect, it is possible that there may be a gene mutation that current testing cannot detect, but that chance is small. It is possible that a different genetic factor, which has not yet been discovered or is not on this panel, is responsible for the cancer diagnoses in the family. Again, the likelihood of this is low. No additional testing is recommended at this time for Ms. Fairfax.  Cancer Screening: These results suggest that Ms. Illingworth's cancer was most likely not  due to an inherited predisposition. Most cancers happen by chance and this test, along with details of her family history, suggests that her cancer falls into this category. She is recommended to follow the cancer screening guidelines provided by her physician.   Family Members: Family members are at some increased risk of developing cancer, over the general population risk, simply due to the family history. They are recommended to speak with their own providers about appropriate cancer screenings.  Any relative who had cancer at a young age or had a particularly rare cancer may also wish to pursue genetic testing. Genetic counselors can be located in other cities, by visiting the website of the [employer] (ArtistMovie.se) and Field seismologist for a Dietitian by zip code.    Lastly, cancer genetics is a rapidly advancing field and it is possible that new genetic tests will be appropriate for Ms. Deak in the future. We encourage her to remain in contact with [place] on an annual basis so we can update her personal and family histories, and let her know of advances in cancer genetics that may benefit the family. Our contact number was provided. Ms. Tamm is welcome to call anytime with additional questions.     Steele Berg, MS, [place] Certified Genetic Counselor phone: [phone]

## 2017-12-06 ENCOUNTER — Telehealth: Payer: Self-pay | Admitting: Hematology

## 2017-12-06 ENCOUNTER — Encounter: Payer: Self-pay | Admitting: Nurse Practitioner

## 2017-12-06 ENCOUNTER — Inpatient Hospital Stay: Payer: 59

## 2017-12-06 ENCOUNTER — Other Ambulatory Visit: Payer: 59

## 2017-12-06 ENCOUNTER — Inpatient Hospital Stay (HOSPITAL_BASED_OUTPATIENT_CLINIC_OR_DEPARTMENT_OTHER): Payer: 59 | Admitting: Nurse Practitioner

## 2017-12-06 VITALS — BP 145/83 | HR 84 | Temp 98.4°F | Resp 18 | Ht 65.0 in | Wt 112.6 lb

## 2017-12-06 DIAGNOSIS — C259 Malignant neoplasm of pancreas, unspecified: Secondary | ICD-10-CM | POA: Diagnosis not present

## 2017-12-06 DIAGNOSIS — Z95828 Presence of other vascular implants and grafts: Secondary | ICD-10-CM

## 2017-12-06 DIAGNOSIS — E876 Hypokalemia: Secondary | ICD-10-CM

## 2017-12-06 DIAGNOSIS — N183 Chronic kidney disease, stage 3 unspecified: Secondary | ICD-10-CM

## 2017-12-06 DIAGNOSIS — R740 Nonspecific elevation of levels of transaminase and lactic acid dehydrogenase [LDH]: Secondary | ICD-10-CM | POA: Diagnosis not present

## 2017-12-06 DIAGNOSIS — Z7189 Other specified counseling: Secondary | ICD-10-CM

## 2017-12-06 DIAGNOSIS — R112 Nausea with vomiting, unspecified: Secondary | ICD-10-CM | POA: Diagnosis not present

## 2017-12-06 DIAGNOSIS — R634 Abnormal weight loss: Secondary | ICD-10-CM | POA: Diagnosis not present

## 2017-12-06 DIAGNOSIS — C787 Secondary malignant neoplasm of liver and intrahepatic bile duct: Secondary | ICD-10-CM

## 2017-12-06 DIAGNOSIS — C251 Malignant neoplasm of body of pancreas: Secondary | ICD-10-CM

## 2017-12-06 DIAGNOSIS — H8109 Meniere's disease, unspecified ear: Secondary | ICD-10-CM | POA: Diagnosis not present

## 2017-12-06 DIAGNOSIS — R630 Anorexia: Secondary | ICD-10-CM | POA: Diagnosis not present

## 2017-12-06 DIAGNOSIS — Z5111 Encounter for antineoplastic chemotherapy: Secondary | ICD-10-CM | POA: Diagnosis not present

## 2017-12-06 LAB — COMPREHENSIVE METABOLIC PANEL
ALBUMIN: 3.3 g/dL — AB (ref 3.5–5.0)
ALT: 44 U/L (ref 0–55)
AST: 62 U/L — AB (ref 5–34)
Alkaline Phosphatase: 293 U/L — ABNORMAL HIGH (ref 40–150)
Anion gap: 10 (ref 3–11)
BUN: 13 mg/dL (ref 7–26)
CO2: 27 mmol/L (ref 22–29)
CREATININE: 1.75 mg/dL — AB (ref 0.60–1.10)
Calcium: 9.6 mg/dL (ref 8.4–10.4)
Chloride: 104 mmol/L (ref 98–109)
GFR calc Af Amer: 37 mL/min — ABNORMAL LOW (ref >60)
GFR calc non Af Amer: 32 mL/min — ABNORMAL LOW (ref >60)
GLUCOSE: 109 mg/dL (ref 70–140)
Potassium: 3.2 mmol/L — ABNORMAL LOW (ref 3.5–5.1)
SODIUM: 141 mmol/L (ref 136–145)
Total Bilirubin: 0.5 mg/dL (ref 0.2–1.2)
Total Protein: 7.2 g/dL (ref 6.4–8.3)

## 2017-12-06 LAB — CBC WITH DIFFERENTIAL/PLATELET
Basophils Absolute: 0 10*3/uL (ref 0.0–0.1)
Basophils Relative: 0 %
EOS ABS: 0.1 10*3/uL (ref 0.0–0.5)
Eosinophils Relative: 1 %
HEMATOCRIT: 29.7 % — AB (ref 34.8–46.6)
Hemoglobin: 9.7 g/dL — ABNORMAL LOW (ref 11.6–15.9)
Lymphocytes Relative: 13 %
Lymphs Abs: 1.3 10*3/uL (ref 0.9–3.3)
MCH: 34.6 pg — ABNORMAL HIGH (ref 25.1–34.0)
MCHC: 32.7 g/dL (ref 31.5–36.0)
MCV: 106.1 fL — ABNORMAL HIGH (ref 79.5–101.0)
MONO ABS: 1.5 10*3/uL — AB (ref 0.1–0.9)
MONOS PCT: 15 %
NEUTROS ABS: 7 10*3/uL — AB (ref 1.5–6.5)
Neutrophils Relative %: 71 %
Platelets: 110 10*3/uL — ABNORMAL LOW (ref 145–400)
RBC: 2.8 MIL/uL — ABNORMAL LOW (ref 3.70–5.45)
RDW: 15.8 % — AB (ref 11.2–14.5)
WBC: 9.9 10*3/uL (ref 3.9–10.3)

## 2017-12-06 MED ORDER — ATROPINE SULFATE 1 MG/ML IJ SOLN
0.5000 mg | Freq: Once | INTRAMUSCULAR | Status: AC | PRN
  Administered 2017-12-06: 0.5 mg via INTRAVENOUS

## 2017-12-06 MED ORDER — IRINOTECAN HCL CHEMO INJECTION 100 MG/5ML
140.0000 mg/m2 | Freq: Once | INTRAVENOUS | Status: AC
  Administered 2017-12-06: 220 mg via INTRAVENOUS
  Filled 2017-12-06: qty 11

## 2017-12-06 MED ORDER — OXALIPLATIN CHEMO INJECTION 100 MG/20ML
70.0000 mg/m2 | Freq: Once | INTRAVENOUS | Status: AC
  Administered 2017-12-06: 115 mg via INTRAVENOUS
  Filled 2017-12-06: qty 20

## 2017-12-06 MED ORDER — POTASSIUM CHLORIDE 40 MEQ/15ML (20%) PO SOLN: 40.0000 meq | Freq: Every day | ORAL | 0 refills | Status: DC

## 2017-12-06 MED ORDER — ATROPINE SULFATE 1 MG/ML IJ SOLN
INTRAMUSCULAR | Status: AC
  Filled 2017-12-06: qty 1

## 2017-12-06 MED ORDER — IRINOTECAN HCL CHEMO INJECTION 100 MG/5ML: 140.0000 mg/m2 | Freq: Once | INTRAVENOUS | Status: DC

## 2017-12-06 MED ORDER — LEUCOVORIN CALCIUM INJECTION 350 MG
400.0000 mg/m2 | Freq: Once | INTRAVENOUS | Status: AC
  Administered 2017-12-06: 668 mg via INTRAVENOUS
  Filled 2017-12-06: qty 33.4

## 2017-12-06 MED ORDER — DEXTROSE 5 % IV SOLN
Freq: Once | INTRAVENOUS | Status: AC
  Administered 2017-12-06: 11:00:00 via INTRAVENOUS

## 2017-12-06 MED ORDER — SODIUM CHLORIDE 0.9 % IV SOLN
Freq: Once | INTRAVENOUS | Status: AC
  Administered 2017-12-06: 12:00:00 via INTRAVENOUS
  Filled 2017-12-06: qty 5

## 2017-12-06 MED ORDER — SODIUM CHLORIDE 0.9% FLUSH
10.0000 mL | INTRAVENOUS | Status: DC | PRN
Start: 2017-12-06 — End: 2017-12-06
  Administered 2017-12-06: 10 mL
  Filled 2017-12-06: qty 10

## 2017-12-06 MED ORDER — PALONOSETRON HCL INJECTION 0.25 MG/5ML
0.2500 mg | Freq: Once | INTRAVENOUS | Status: AC
  Administered 2017-12-06: 0.25 mg via INTRAVENOUS

## 2017-12-06 MED ORDER — PALONOSETRON HCL INJECTION 0.25 MG/5ML
INTRAVENOUS | Status: AC
  Filled 2017-12-06: qty 5

## 2017-12-06 MED ORDER — SODIUM CHLORIDE 0.9 % IV SOLN
2200.0000 mg/m2 | INTRAVENOUS | Status: DC
  Administered 2017-12-06: 3650 mg via INTRAVENOUS
  Filled 2017-12-06: qty 73

## 2017-12-06 MED ORDER — PROCHLORPERAZINE MALEATE 10 MG PO TABS: 10.0000 mg | ORAL_TABLET | Freq: Four times a day (QID) | ORAL | 2 refills | Status: DC | PRN

## 2017-12-06 NOTE — Patient Instructions (Signed)
[name] Discharge Instructions for Patients Receiving Chemotherapy  Today you received the following chemotherapy agents oxaliplatin, leucovorin, irinotecan, and 5FU  To help prevent nausea and vomiting after your treatment, we encourage you to take your nausea medication as directed   If you develop nausea and vomiting that is not controlled by your nausea medication, call the clinic.   BELOW ARE SYMPTOMS THAT SHOULD BE REPORTED IMMEDIATELY:  *FEVER GREATER THAN 100.5 F  *CHILLS WITH OR WITHOUT FEVER  NAUSEA AND VOMITING THAT IS NOT CONTROLLED WITH YOUR NAUSEA MEDICATION  *UNUSUAL SHORTNESS OF BREATH  *UNUSUAL BRUISING OR BLEEDING  TENDERNESS IN MOUTH AND THROAT WITH OR WITHOUT PRESENCE OF ULCERS  *URINARY PROBLEMS  *BOWEL PROBLEMS  UNUSUAL RASH Items with * indicate a potential emergency and should be followed up as soon as possible.  Feel free to call the clinic should you have any questions or concerns. The clinic phone number is [phone].  Please show the [place] at check-in to the Emergency Department and triage nurse.

## 2017-12-06 NOTE — Progress Notes (Signed)
[place]  [phone] [phone]  Clinic Follow up Note   Patient Care Team: [name], MD as PCP - General (Family Medicine) [name], MD as Consulting Physician (Otolaryngology) 12/06/2017  SUMMARY OF ONCOLOGIC HISTORY: Oncology History   Cancer Staging Pancreatic cancer [facility]) Staging form: Exocrine Pancreas, AJCC 8th Edition - Clinical stage from 08/06/2017: Stage IV (cTX, cN0, pM1) - Signed by [name], MD on 08/12/2017       Pancreatic cancer ([place])   07/23/2017 Imaging    US abdomen limited RUQ 07/23/17 IMPRESSION: 1. Cholelithiasis.  No secondary signs of acute cholecystitis. 2. Multiple solid liver masses measuring up to 6.5 cm in the left lobe of the liver, evaluation for metastatic disease is recommended. These results will be called to the ordering clinician or representative by the Radiologist Assistant, and communication documented in the PACS or zVision Dashboard.      07/23/2017 Imaging    CT Abdomen W Contrast 07/23/17 IMPRESSION: 1. Widespread metastatic disease throughout the liver. No clear primary malignancy identified in the abdomen. The pelvis was not imaged. Tissue sampling recommended. 2. Probable adenopathy superior to the pancreatic tail. No evidence of pancreatic mass. 3. Suspected incidental hemangioma inferiorly in the right hepatic lobe. 4. Nonspecific nodularity in the breasts. The patient has undergone recent (03/25/2017 and 04/01/2017) mammography and ultrasound.      07/29/2017 Initial Diagnosis    Metastasis to liver of unknown origin ([place])      07/29/2017 PET scan    PET 07/29/17  IMPRESSION: 1. Numerous bulky liver masses are hypermetabolic compatible with malignancy. 2. Accentuated activity within or along the pancreatic tail, likely represent a primary pancreatic tumor. Consider pancreatic protocol MRI to further work this up. 3. The peripancreatic lymph node shown above the  pancreatic tail is mildly hypermetabolic favoring malignancy. 4.  Prominent stool throughout the colon favors constipation. 5. 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      08/05/2017 Pathology Results    Liver Biopsy  Diagnosis 08/05/17 Liver, needle/core biopsy - CARCINOMA. - SEE COMMENT. Microscopic Comment The malignant cells are positive for cytokeratin 7. They are negative for arginase, CDX2, cytokeratin 20, estrogen receptor, GATA-3, GCDFP, Glypican 3, Hep Par 1, Napsin A, and TTF-1. This immunohistochemical is nonspecific. Possibly primary sources include pancreatobiliary and upper gastrointestinal. Radiologic correlation is necessary. Of note, organ specific markers (GATA-3, GCDFP-breast, TTF-1, Napsin A-lung, and CDX2-colon) are negative. (JBK:ecj 08/09/2017)      08/21/2017 -  Chemotherapy    FOLFIRINOX every 2 weeks starting 08/21/17        10/14/2017 Imaging    CT CAP WO Contrast 10/14/17 IMPRESSION: Evidence of known numerous liver metastases without significant interval change. No other evidence of metastatic disease within the chest, abdomen or pelvis. Cholelithiasis. Tiny pericardial effusion.      12/02/2017 Genetic Testing    Negative for pathogenic mutation.  The genes analyzed were the 83 genes on [facility]'s Multi-Cancer panel (ALK, APC, ATM, AXIN2, BAP1, BARD1, BLM, BMPR1A, BRCA1, BRCA2, BRIP1, CASR, CDC73, CDH1, CDK4, CDKN1B, CDKN1C, CDKN2A, CEBPA, CHEK2, CTNNA1, DICER1, DIS3L2, EGFR, EPCAM, FH, FLCN, GATA2, GPC3, GREM1, HOXB13, HRAS, KIT, MAX, MEN1, MET, MITF, MLH1, MSH2, MSH3, MSH6, MUTYH, NBN, NF1, NF2, NTHL1, PALB2, PDGFRA, PHOX2B, PMS2, POLD1, POLE, POT1, PRKAR1A, PTCH1, PTEN, RAD50, RAD51C, RAD51D, RB1, RECQL4, RET, RUNX1, SDHA, SDHAF2, SDHB, SDHC, SDHD, SMAD4, SMARCA4, SMARCB1, SMARCE1, STK11, SUFU, TERC, TERT, TMEM127, TP53, TSC1, TSC2, VHL, WRN, WT1).     CURRENT THERAPY: FOLFIRINOX every 2 weeks  started 08/21/17   INTERVAL HISTORY: Ms. Wagler  returns for follow up as scheduled prior to cycle 8 FOLFOX. She completed cycle 7 after 1 week delay for neutropenia on 11/22/17 without difficulty. Energy level returned quicker with last cycle than previous. Infrequent nausea, once weekly, controlled with compazine PRN. She vomits occasionally if she brushes her teeth too soon after eating. no abdominal pain, no mucositis. Regular BM q1-2 days, no hematochezia. Drinks ensure/boost 1 per day or less if her appetite is improved and she eats well. Drinking well. Cold sensitivity is most intense for 4-5 days after chemo but can linger around 2 weeks. No other neuropathy. Takes oral K TID.   REVIEW OF SYSTEMS:   Constitutional: Denies fevers, chills or abnormal weight loss (+) fatigue, improved faster with last cycle  Eyes: Denies blurriness of vision Ears, nose, mouth, throat, and face: Denies mucositis or sore throat Respiratory: Denies cough, dyspnea or wheezes Cardiovascular: Denies palpitation, chest discomfort or lower extremity swelling Gastrointestinal:  Denies constipation, diarrhea, heartburn or change in bowel habits (+) infrequent nausea, once weekly, controlled with compazine (+) emesis if brushes teeth soon after eating (+) left inguinal hernia Skin: Denies abnormal skin rashes Lymphatics: Denies new lymphadenopathy or easy bruising Neurological:Denies numbness, tingling or new weaknesses (+) cold sensitivity 4-5 days, up to 2 weeks with oxaliplatin  Behavioral/Psych: Mood is stable, no new changes  All other systems were reviewed with the patient and are negative.  MEDICAL HISTORY:  Past Medical History:  Diagnosis Date  . Genetic testing 12/02/2017   Multi-Cancer panel (83 genes) @ [facility] - No pathogenic mutations detected  . Meniere disease 2016  . Pancreatitis     SURGICAL HISTORY: Past Surgical History:  Procedure Laterality Date  . CERVICAL ABLATION    . ENDOMETRIAL ABLATION  2011  . IR FLUORO GUIDE PORT INSERTION RIGHT   08/12/2017  . IR US GUIDE VASC ACCESS RIGHT  08/12/2017  . [place]    I have reviewed the social history and family history with the patient and they are unchanged from previous note.  ALLERGIES:  has No Known Allergies.  MEDICATIONS:  Current Outpatient Medications  Medication Sig Dispense Refill  . bisacodyl (DULCOLAX) 5 MG EC tablet Take 5 mg by mouth daily as needed for mild constipation or moderate constipation.    . lidocaine-prilocaine (EMLA) cream Apply to affected area once 30 g 3  . loratadine (CLARITIN) 10 MG tablet Take 10 mg by mouth daily as needed for allergies.    . magic mouthwash w/lidocaine SOLN Take 10 mLs by mouth 3 (three) times daily as needed for mouth pain. Swish and spit 10 ML by mouth 3 times daily as needed for mouth pain. 240 mL 0  . meclizine (ANTIVERT) 25 MG tablet Take 25 mg by mouth 3 (three) times daily as needed for dizziness.    . ondansetron (ZOFRAN-ODT) 8 MG disintegrating tablet Take 1 tablet (8 mg total) by mouth every 8 (eight) hours as needed for nausea or vomiting. 40 tablet 2  . polyethylene glycol (MIRALAX / GLYCOLAX) packet Take 17 g by mouth daily as needed for mild constipation.    . potassium chloride (MICRO-K) 10 MEQ CR capsule TAKE 2 CAPSULES(20 MEQ) BY MOUTH TWICE DAILY FOR 5 DAYS THEN TAKE 2 CAPSULES BY MOUTH DAILY 60 capsule 1  . prochlorperazine (COMPAZINE) 10 MG tablet Take 1 tablet (10 mg total) by mouth every 6 (six) hours as needed for nausea or vomiting. 40 tablet  2  . traMADol (ULTRAM) 50 MG tablet Take 1 tablet (50 mg total) by mouth every 6 (six) hours as needed. 30 tablet 0   No current facility-administered medications for this visit.     PHYSICAL EXAMINATION: ECOG PERFORMANCE STATUS: 1 - Symptomatic but completely ambulatory  Vitals:   12/06/17 1040  BP: (!) 145/83  Pulse: 84  Resp: 18  Temp: 98.4 F (36.9 C)  SpO2: 100%   Filed Weights   12/06/17 1040  Weight: 112 lb 9.6 oz (51.1 kg)     GENERAL:alert, no distress and comfortable SKIN: skin color, texture, turgor are normal, no rashes or significant lesions EYES: normal, Conjunctiva are pink and non-injected, sclera clear OROPHARYNX:no exudate, no erythema and lips, buccal mucosa, and tongue normal  LYMPH:  no palpable cervical, supraclavicular, axillary or inguinal lymphadenopathy LUNGS: clear to auscultation with normal breathing effort HEART: regular rate & rhythm and no murmurs and no lower extremity edema ABDOMEN:abdomen soft, non-tender and normal bowel sounds. No hepatomegaly  Musculoskeletal:no cyanosis of digits and no clubbing  NEURO: alert & oriented x 3 with fluent speech, no focal motor/sensory deficits PAC without erythema   LABORATORY DATA:  I have reviewed the data as listed CBC Latest Ref Rng & Units 12/06/2017 11/22/2017 11/15/2017  WBC 3.9 - 10.3 K/uL 9.9 2.9(L) 1.6(L)  Hemoglobin 11.6 - 15.9 g/dL 9.7(L) 10.9(L) 9.9(L)  Hematocrit 34.8 - 46.6 % 29.7(L) 32.2(L) 29.7(L)  Platelets 145 - 400 K/uL 110(L) 170 156     CMP Latest Ref Rng & Units 12/06/2017 11/22/2017 11/15/2017  Glucose 70 - 140 mg/dL 109 116 113  BUN 7 - 26 mg/dL 13 14 13   Creatinine 0.60 - 1.10 mg/dL 1.75(H) 2.04(H) 1.88(H)  Sodium 136 - 145 mmol/L 141 141 139  Potassium 3.5 - 5.1 mmol/L 3.2(L) 2.9(LL) 3.1(L)  Chloride 98 - 109 mmol/L 104 103 103  CO2 22 - 29 mmol/L 27 27 27   Calcium 8.4 - 10.4 mg/dL 9.6 9.9 9.8  Total Protein 6.4 - 8.3 g/dL 7.2 7.4 7.3  Total Bilirubin 0.2 - 1.2 mg/dL 0.5 0.5 0.7  Alkaline Phos 40 - 150 U/L 293(H) 278(H) 244(H)  AST 5 - 34 U/L 62(H) 69(H) 67(H)  ALT 0 - 55 U/L 44 54 56(H)   PATHOLOGY  Liver Biopsy  Diagnosis 08/05/17 Liver, needle/core biopsy - CARCINOMA. - SEE COMMENT. Microscopic Comment The malignant cells are positive for cytokeratin 7. They are negative for arginase, CDX2, cytokeratin 20, estrogen receptor, GATA-3, GCDFP, Glypican 3, Hep Par 1, Napsin A, and TTF-1. This  immunohistochemical is nonspecific. Possibly primary sources include pancreatobiliary and upper gastrointestinal. Radiologic correlation is necessary. Of note, organ specific markers (GATA-3, GCDFP-breast, TTF-1, Napsin A-lung, and CDX2-colon) are negative. (JBK:ecj 08/09/2017)   RADIOGRAPHIC STUDIES: I have personally reviewed the radiological images as listed and agreed with the findings in the report. No results found.   ASSESSMENT & PLAN: Morgan Dawson is a 57 y.o. caucasian female with a history of vertigo and menieres disease, presented with epigastric discomfort, low appetite and 5 pound weight loss   1. 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 Perfecto appears stable. She completed cycle 7 on 11/22/17 after 1 week delay due to neutropenia. She received neulasta on 11/24/17 without difficulty. She is tolerating treatment well overall. CA 19-9 has been trending down, in normal range on 11/15/17 (33), likely indicating disease control with chemotherapy. Given CKD she cannot get IV contrast; Dr. Burr Medico ordered PET scan after cycle 8.  Labs reviewed, she has mild thrombocytopenia, PLT 110k but otherwise labs adequate to proceed with cycle 8 FOLFIRINOX today at current doses. F/u in 2 weeks after PET scan with cycle 9.   2. Pancreatitis -Denies abdominal pain  3. Nausea and vomiting -Emend added with cycle 3, nausea/vomiting has been well controlled. Refilled compazine. She has emesis without nausea if she brushes her teeth too soon after eating, which may be stimulation of gag reflex.   4. Anorexia, weight loss -f/u with dietician. Weight has been stable lately. She drinks 1 ensure per day but holds if her appetite is improved and she is eating well. Prefers to hold medication for appetite at this time. I encouraged her to continue small frequent meals that are high calorie and protein and to drink ensure routinely. She agrees.   5. Hypercalcemia, secondary to malignancy   -S/p zometa and IV hydration 07/2017; resolved   6. Transaminitis, secondary to liver metastasis  -Improved with chemo, stable. Will monitor. Adequate for treatment 12/06/17.   7. Goal of care discussion - full code   8. Hypokalemia, CKD stage III  -Cr. 1.75 today, improved lately. I encouraged her to continue adequate oral hydration. I recommend she call [facility] for IVF support if she gets nauseous with treatment and unable to hydrate enough, will add on IVF with pump d/c or PRN.  -K 3.2, on 30-40 mEq daily (10 mEq tabs), she has significant difficulty swallowing tablets and cannot swallow 20 mEq tabs. Will try liquid, changed prescription today. I encouraged her to increase K in her diet and drink ensure routinely.   9. Meniere's disease  -ENT prescribed aldactone, but holding due to elevated creatinine. She had a dizzy episode with flushing during position change on today's exam. She reports episodes have been infrequent and mild lately. I recommend she change position slowly.   10. Genetics  -Negative for pathogenic mutation per report on 12/02/17; we reviewed results today.   PLAN: -Labs reviewed, proceed with cycle 8 FOLFIRINOX at current doses, neulasta on day 3 -changed oral K from tablet to liquid, 40 mEq po liquid daily, increase K in diet -Refilled compazine  -Drink adequate amounts of water, call [facility] for IVF if not able to drink enough -PET 12/17/17 -F/u with Dr. Burr Medico and cycle 9 on 12/20/17  All questions were answered. The patient knows to call the clinic with any problems, questions or concerns. No barriers to learning was detected. I spent 20 minutes counseling the patient face to face. The total time spent in the appointment was 25 minutes and more than 50% was on counseling and review of test results     Alla Feeling, NP 12/06/17

## 2017-12-06 NOTE — Progress Notes (Signed)
Per Cira Rue, NP okay to treat pt with crt of 1.75

## 2017-12-06 NOTE — Progress Notes (Signed)
12/06/17 @ 1130  Patient with >10% wt change, only dose impacted in Irinotecan, per MD dose adjust appropriately for the Irinotecan.  T.O. Dr Feng/CJones, PharmD  Henreitta Leber, PharmD

## 2017-12-06 NOTE — Patient Instructions (Signed)
Implanted Port Home Guide An implanted port is a type of central line that is placed under the skin. Central lines are used to provide IV access when treatment or nutrition needs to be given through a person's veins. Implanted ports are used for long-term IV access. An implanted port may be placed because:  You need IV medicine that would be irritating to the small veins in your hands or arms.  You need long-term IV medicines, such as antibiotics.  You need IV nutrition for a long period.  You need frequent blood draws for lab tests.  You need dialysis.  Implanted ports are usually placed in the chest area, but they can also be placed in the upper arm, the abdomen, or the leg. An implanted port has two main parts:  Reservoir. The reservoir is round and will appear as a small, raised area under your skin. The reservoir is the part where a needle is inserted to give medicines or draw blood.  Catheter. The catheter is a thin, flexible tube that extends from the reservoir. The catheter is placed into a large vein. Medicine that is inserted into the reservoir goes into the catheter and then into the vein.  How will I care for my incision site? Do not get the incision site wet. Bathe or shower as directed by your health care provider. How is my port accessed? Special steps must be taken to access the port:  Before the port is accessed, a numbing cream can be placed on the skin. This helps numb the skin over the port site.  Your health care provider uses a sterile technique to access the port. ? Your health care provider must put on a mask and sterile gloves. ? The skin over your port is cleaned carefully with an antiseptic and allowed to dry. ? The port is gently pinched between sterile gloves, and a needle is inserted into the port.  Only "non-coring" port needles should be used to access the port. Once the port is accessed, a blood return should be checked. This helps ensure that the port  is in the vein and is not clogged.  If your port needs to remain accessed for a constant infusion, a clear (transparent) bandage will be placed over the needle site. The bandage and needle will need to be changed every week, or as directed by your health care provider.  Keep the bandage covering the needle clean and dry. Do not get it wet. Follow your health care provider's instructions on how to take a shower or bath while the port is accessed.  If your port does not need to stay accessed, no bandage is needed over the port.  What is flushing? Flushing helps keep the port from getting clogged. Follow your health care provider's instructions on how and when to flush the port. Ports are usually flushed with saline solution or a medicine called heparin. The need for flushing will depend on how the port is used.  If the port is used for intermittent medicines or blood draws, the port will need to be flushed: ? After medicines have been given. ? After blood has been drawn. ? As part of routine maintenance.  If a constant infusion is running, the port may not need to be flushed.  How long will my port stay implanted? The port can stay in for as long as your health care provider thinks it is needed. When it is time for the port to come out, surgery will be   done to remove it. The procedure is similar to the one performed when the port was put in. When should I seek immediate medical care? When you have an implanted port, you should seek immediate medical care if:  You notice a bad smell coming from the incision site.  You have swelling, redness, or drainage at the incision site.  You have more swelling or pain at the port site or the surrounding area.  You have a fever that is not controlled with medicine.  This information is not intended to replace advice given to you by your health care provider. Make sure you discuss any questions you have with your health care provider. Document  Released: 08/10/2005 Document Revised: 01/16/2016 Document Reviewed: 04/17/2013 Elsevier Interactive Patient Education  2017 Elsevier Inc.  

## 2017-12-06 NOTE — Telephone Encounter (Signed)
Appointments scheduled per 4/15 los/ pt to pick up schedule from infusion

## 2017-12-08 ENCOUNTER — Inpatient Hospital Stay: Payer: 59

## 2017-12-08 VITALS — BP 138/82 | HR 82 | Temp 98.4°F | Resp 18

## 2017-12-08 DIAGNOSIS — Z7189 Other specified counseling: Secondary | ICD-10-CM

## 2017-12-08 DIAGNOSIS — Z5111 Encounter for antineoplastic chemotherapy: Secondary | ICD-10-CM | POA: Diagnosis not present

## 2017-12-08 DIAGNOSIS — C259 Malignant neoplasm of pancreas, unspecified: Secondary | ICD-10-CM

## 2017-12-08 DIAGNOSIS — C787 Secondary malignant neoplasm of liver and intrahepatic bile duct: Principal | ICD-10-CM

## 2017-12-08 MED ORDER — PEGFILGRASTIM INJECTION 6 MG/0.6ML ~~LOC~~
6.0000 mg | PREFILLED_SYRINGE | Freq: Once | SUBCUTANEOUS | Status: AC
  Administered 2017-12-08: 6 mg via SUBCUTANEOUS

## 2017-12-08 MED ORDER — SODIUM CHLORIDE 0.9% FLUSH
10.0000 mL | INTRAVENOUS | Status: DC | PRN
  Administered 2017-12-08: 10 mL
  Filled 2017-12-08: qty 10

## 2017-12-08 MED ORDER — HEPARIN SOD (PORK) LOCK FLUSH 100 UNIT/ML IV SOLN
500.0000 [IU] | Freq: Once | INTRAVENOUS | Status: AC | PRN
  Administered 2017-12-08: 500 [IU]
  Filled 2017-12-08: qty 5

## 2017-12-08 MED ORDER — PEGFILGRASTIM INJECTION 6 MG/0.6ML ~~LOC~~
PREFILLED_SYRINGE | SUBCUTANEOUS | Status: AC
  Filled 2017-12-08: qty 0.6

## 2017-12-08 NOTE — Patient Instructions (Signed)
Pegfilgrastim injection What is this medicine? PEGFILGRASTIM (PEG fil gra stim) is a long-acting granulocyte colony-stimulating factor that stimulates the growth of neutrophils, a type of white blood cell important in the body's fight against infection. It is used to reduce the incidence of fever and infection in patients with certain types of cancer who are receiving chemotherapy that affects the bone marrow, and to increase survival after being exposed to high doses of radiation. This medicine may be used for other purposes; ask your health care provider or pharmacist if you have questions. COMMON BRAND NAME(S): Neulasta What should I tell my health care provider before I take this medicine? They need to know if you have any of these conditions: -kidney disease -latex allergy -ongoing radiation therapy -sickle cell disease -skin reactions to acrylic adhesives (On-Body Injector only) -an unusual or allergic reaction to pegfilgrastim, filgrastim, other medicines, foods, dyes, or preservatives -pregnant or trying to get pregnant -breast-feeding How should I use this medicine? This medicine is for injection under the skin. If you get this medicine at home, you will be taught how to prepare and give the pre-filled syringe or how to use the On-body Injector. Refer to the patient Instructions for Use for detailed instructions. Use exactly as directed. Tell your healthcare provider immediately if you suspect that the On-body Injector may not have performed as intended or if you suspect the use of the On-body Injector resulted in a missed or partial dose. It is important that you put your used needles and syringes in a special sharps container. Do not put them in a trash can. If you do not have a sharps container, call your pharmacist or healthcare provider to get one. Talk to your pediatrician regarding the use of this medicine in children. While this drug may be prescribed for selected conditions,  precautions do apply. Overdosage: If you think you have taken too much of this medicine contact a poison control center or emergency room at once. NOTE: This medicine is only for you. Do not share this medicine with others. What if I miss a dose? It is important not to miss your dose. Call your doctor or health care professional if you miss your dose. If you miss a dose due to an On-body Injector failure or leakage, a new dose should be administered as soon as possible using a single prefilled syringe for manual use. What may interact with this medicine? Interactions have not been studied. Give your health care provider a list of all the medicines, herbs, non-prescription drugs, or dietary supplements you use. Also tell them if you smoke, drink alcohol, or use illegal drugs. Some items may interact with your medicine. This list may not describe all possible interactions. Give your health care provider a list of all the medicines, herbs, non-prescription drugs, or dietary supplements you use. Also tell them if you smoke, drink alcohol, or use illegal drugs. Some items may interact with your medicine. What should I watch for while using this medicine? You may need blood work done while you are taking this medicine. If you are going to need a MRI, CT scan, or other procedure, tell your doctor that you are using this medicine (On-Body Injector only). What side effects may I notice from receiving this medicine? Side effects that you should report to your doctor or health care professional as soon as possible: -allergic reactions like skin rash, itching or hives, swelling of the face, lips, or tongue -dizziness -fever -pain, redness, or irritation at site   where injected -pinpoint red spots on the skin -red or dark-brown urine -shortness of breath or breathing problems -stomach or side pain, or pain at the shoulder -swelling -tiredness -trouble passing urine or change in the amount of urine Side  effects that usually do not require medical attention (report to your doctor or health care professional if they continue or are bothersome): -bone pain -muscle pain This list may not describe all possible side effects. Call your doctor for medical advice about side effects. You may report side effects to FDA at 1-800-FDA-1088. Where should I keep my medicine? Keep out of the reach of children. Store pre-filled syringes in a refrigerator between 2 and 8 degrees C (36 and 46 degrees F). Do not freeze. Keep in carton to protect from light. Throw away this medicine if it is left out of the refrigerator for more than 48 hours. Throw away any unused medicine after the expiration date. NOTE: This sheet is a summary. It may not cover all possible information. If you have questions about this medicine, talk to your doctor, pharmacist, or health care provider.  2018 Elsevier/Gold Standard (2016-08-06 12:58:03)  

## 2017-12-16 NOTE — Progress Notes (Signed)
Prior auth for Potassium Cl 10MEQ capsules has been submitted. Status is pending.

## 2017-12-17 ENCOUNTER — Ambulatory Visit (HOSPITAL_COMMUNITY)
Admission: RE | Admit: 2017-12-17 | Discharge: 2017-12-17 | Disposition: A | Discharge status: Home / Self Care | Payer: 59 | Source: Ambulatory Visit | Attending: Hematology | Admitting: Hematology

## 2017-12-17 DIAGNOSIS — C251 Malignant neoplasm of body of pancreas: Secondary | ICD-10-CM

## 2017-12-17 DIAGNOSIS — K8020 Calculus of gallbladder without cholecystitis without obstruction: Secondary | ICD-10-CM | POA: Insufficient documentation

## 2017-12-17 DIAGNOSIS — C787 Secondary malignant neoplasm of liver and intrahepatic bile duct: Secondary | ICD-10-CM | POA: Insufficient documentation

## 2017-12-17 DIAGNOSIS — C259 Malignant neoplasm of pancreas, unspecified: Secondary | ICD-10-CM | POA: Diagnosis not present

## 2017-12-17 LAB — GLUCOSE, CAPILLARY: GLUCOSE-CAPILLARY: 108 mg/dL — AB (ref 65–99)

## 2017-12-17 MED ORDER — FLUDEOXYGLUCOSE F - 18 (FDG) INJECTION
5.6400 | Freq: Once | INTRAVENOUS | Status: AC | PRN
  Administered 2017-12-17: 5.64 via INTRAVENOUS

## 2017-12-18 NOTE — Progress Notes (Signed)
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 Cholelithiasis.  No secondary signs of acute cholecystitis. 2. Multiple solid liver masses measuring up to 6.5 cm in the left lobe of the liver, evaluation for metastatic disease is recommended. These results will be called to the ordering clinician or representative by the Radiologist Assistant, and communication documented in the PACS or zVision Dashboard.      07/23/2017 Imaging    CT Abdomen W Contrast 07/23/17 IMPRESSION: 1. Widespread metastatic disease throughout the liver. No clear primary malignancy identified in the abdomen. The pelvis was not imaged. Tissue sampling recommended. 2. Probable adenopathy superior to the pancreatic tail. No evidence of pancreatic mass. 3. Suspected incidental hemangioma inferiorly in the right hepatic lobe. 4. Nonspecific nodularity in the breasts. The patient has undergone recent (03/25/2017 and 04/01/2017) mammography and ultrasound.      07/29/2017 Initial Diagnosis    Metastasis to liver of unknown origin ([place])      07/29/2017 PET scan    PET 07/29/17  IMPRESSION: 1. Numerous bulky liver masses are hypermetabolic compatible with malignancy. 2. Accentuated activity within or along the pancreatic tail, likely represent a primary pancreatic tumor. Consider pancreatic protocol MRI to further work this  up. 3. The peripancreatic lymph node shown above the pancreatic tail is mildly hypermetabolic favoring malignancy. 4.  Prominent stool throughout the colon favors constipation. 5. 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      08/05/2017 Pathology Results    Liver Biopsy  Diagnosis 08/05/17 Liver, needle/core biopsy - CARCINOMA. - SEE COMMENT. Microscopic Comment The malignant cells are positive for cytokeratin 7. They are negative for arginase, CDX2, cytokeratin 20, estrogen receptor, GATA-3, GCDFP, Glypican 3, Hep Par 1, Napsin A, and TTF-1. This immunohistochemical is nonspecific. Possibly primary sources include pancreatobiliary and upper gastrointestinal. Radiologic correlation is necessary. Of note, organ specific markers (GATA-3, GCDFP-breast, TTF-1, Napsin A-lung, and CDX2-colon) are negative. (JBK:ecj 08/09/2017)      08/21/2017 -  Chemotherapy    FOLFIRINOX every 2 weeks starting 08/21/17        10/14/2017 Imaging    CT CAP WO Contrast 10/14/17 IMPRESSION: Evidence of known numerous liver metastases without significant interval change. No other evidence of metastatic disease within the chest, abdomen or pelvis. Cholelithiasis. Tiny pericardial effusion.      12/02/2017 Genetic Testing    Negative for pathogenic mutation.  The genes analyzed were the 83 genes on [facility]'s Multi-Cancer panel (ALK, APC, ATM, AXIN2, BAP1, BARD1, BLM, BMPR1A, BRCA1, BRCA2, BRIP1, CASR, CDC73, CDH1, CDK4, CDKN1B, CDKN1C, CDKN2A, CEBPA, CHEK2, CTNNA1, DICER1, DIS3L2, EGFR, EPCAM, FH, FLCN, GATA2, GPC3, GREM1, HOXB13, HRAS, KIT, MAX, MEN1, MET, MITF, MLH1, MSH2, MSH3, MSH6, MUTYH, NBN, NF1, NF2, NTHL1, PALB2, PDGFRA, PHOX2B, PMS2, POLD1, POLE, POT1, PRKAR1A, PTCH1, PTEN, RAD50, RAD51C, RAD51D, RB1, RECQL4, RET, RUNX1, SDHA, SDHAF2, SDHB, SDHC, SDHD, SMAD4, SMARCA4, SMARCB1, SMARCE1, STK11, SUFU, TERC, TERT, TMEM127, TP53, TSC1, TSC2, VHL,  WRN, WT1).      12/17/2017 PET scan    IMPRESSION: 1.  Although the liver metastases are still visible on the CT images, they are significantly smaller and retain no abnormal metabolic activity, consistent with response to therapy. 2. No abnormal activity within the pancreas. 3. No disease progression identified. 4. Cholelithiasis.        HISTORY OF PRESENTING ILLNESS: 07/30/17  Morgan Dawson 57 y.o. 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 The patient was referred by her PCP Dr. Maurice Dawson. The patient presents to the clinic today accompanied by husband.  Today the patient reports the weekend before Thanksgiving she had abdominal issues with bloating and cramps. These symptoms worsened after she would eat. This persisted past Thanksgiving so she went to her PCP.  She presented to her PCP on 07/20/17 for upper abdominal pain, nausea and bloating. She had lab work up and RUQ [place] on 07/23/17. Results showed elevated LFTs (alk phos 227, AST 191, ALT 275) and US showed multiple masses in her liver and pancreatitis. She was put on a bland die due to her pancreatitis. CT AP in 07/23/17 showed diffuse metastatic disease in her liver with no clear primary. She had a PET scan that shows possible metastasis to peripancreatic lymph node along with her known liver masses.   Today she notes she has focused soreness in RUQ. She feels nauseous and has vomited 3 times total in the past 3 weeks. She has lost weight, initially purposefully. She has lost 5-10 pounds. She has been constipated  lately. She takes dulcolax to help. She denies dark stool or GI bleeding. No hematemesis. She does feel more fatigued. She was relatively active 4-5 days a week before last month. She has not needed medication as she feels she has more discomfort (1-2/10). She has not been exercising because she is afraid of making things worse. Her appetite is low from her discomfort and nausea. She notes multiple tender knots on her lower legs that  appeared 5 nights ago. There is no itching. She notes she will follow up with Dr. Paulita Dawson this month for her pancreatitis.   Her last mammogram at the [facility] was 04/01/17 and mostly benign. She was recommended to repeat in 6 months. She notes she frequently has cyst in her breast which were benign before.  Socially she is not working. She usually is active in the gym 3-4 days a week. She has 2 children (son and daughter) at 9yo and 80yo.   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 Her last episode of vertigo was a few months ago. She takes a diuretic and a low salt diet. She has jaw surgery for TMJ issues. She had endometrial ablation due to menorrhagia in 2011. She has only spotted since, no full period. Patient had a colonoscopy in 2014 with one benign polyp removed. Her father had colon cancer in his 8s. Her maternal grandfather had colon cancer in his 23s. Paternal aunt had lymphoma.   CURRENT THERAPY: mFOLFIRINOX every 2 weeks started 08/21/17   INTERVAL HISTORY Morgan Dawson is here for a follow up and Cycle 9 FOLFIRINOX. She presents to the clinic today accompanied by her husband. She reports she is doing well overall. She has mild diarrhea and nausea that are manageable only on day 2-5 after treatment and then she recovers well. She does have occasional nose bleeding when she blows her nose. She does  have peripheral neuropathy and cold sensitivity in her hands. She notes it improves in the second week.   On review of systems, pt denies any other complaints at this time. Pertinent positives are listed and detailed within the above HPI.   MEDICAL HISTORY:  Past Medical History:  Diagnosis Date  . Genetic testing 12/02/2017   Multi-Cancer panel (83 genes) @ [facility] - No pathogenic mutations detected  . Meniere disease 2016  . Pancreatitis     SURGICAL HISTORY: Past Surgical History:  Procedure Laterality Date  . CERVICAL ABLATION    . ENDOMETRIAL ABLATION  2011  .  IR FLUORO GUIDE PORT INSERTION RIGHT  08/12/2017  . IR US GUIDE VASC ACCESS RIGHT  08/12/2017  . MANDIBLE SURGERY  1999    SOCIAL HISTORY: Social History   Socioeconomic History  . Marital status: Married    Spouse name: Not on file  . Number of children: Not on file  . Years of education: Not on file  . Highest education level: Not on file  Occupational History  . Not on file  Social Needs  . Financial resource strain: Not on file  . Food insecurity:    Worry: Not on file    Inability: Not on file  . Transportation needs:    Medical: Not on file    Non-medical: Not on file  Tobacco Use  . Smoking status: Never Smoker  . Smokeless tobacco: Never Used  Substance and Sexual Activity  . Alcohol use: No    Frequency: Never  . Drug use: No  . Sexual activity: Not on file  Lifestyle  . Physical activity:    Days per week: Not on file    Minutes per session: Not on file  . Stress: Not on file  Relationships  . Social connections:    Talks on phone: Not on file    Gets together: Not on file    Attends religious service: Not on file    Active member of club or organization: Not on file    Attends meetings of clubs or organizations: Not on file    Relationship status: Not on file  . Intimate partner violence:    Fear of current or ex partner: Not on file    Emotionally abused: Not on file    Physically abused: Not on file    Forced sexual activity: Not on file  Other Topics Concern  . Not on file  Social History Narrative  . Not on file    FAMILY HISTORY: Family History  Problem Relation Age of Onset  . Colon cancer Father 46       currently 44  . Colon cancer Maternal Grandfather   . Breast cancer Cousin   . Thyroid cancer Mother 27       facial radiation for acne as teen  . Lymphoma Paternal Aunt 30       deceased 77  . Breast cancer Paternal Aunt     ALLERGIES:  has No Known Allergies.  MEDICATIONS:  Current Outpatient Medications  Medication Sig  Dispense Refill  . bisacodyl (DULCOLAX) 5 MG EC tablet Take 5 mg by mouth daily as needed for mild constipation or moderate constipation.    . lidocaine-prilocaine (EMLA) cream Apply to affected area once 30 g 3  . loratadine (CLARITIN) 10 MG tablet Take 10 mg by mouth daily as needed for allergies.    . magic mouthwash w/lidocaine SOLN Take 10 mLs by mouth 3 (three) times daily  as needed for mouth pain. Swish and spit 10 ML by mouth 3 times daily as needed for mouth pain. 240 mL 0  . meclizine (ANTIVERT) 25 MG tablet Take 25 mg by mouth 3 (three) times daily as needed for dizziness.    . ondansetron (ZOFRAN-ODT) 8 MG disintegrating tablet Take 1 tablet (8 mg total) by mouth every 8 (eight) hours as needed for nausea or vomiting. 40 tablet 2  . polyethylene glycol (MIRALAX / GLYCOLAX) packet Take 17 g by mouth daily as needed for mild constipation.    . Potassium Chloride 40 MEQ/15ML (20%) SOLN Take 40 mEq by mouth daily. Take 15 ml by mouth daily 473 mL 0  . prochlorperazine (COMPAZINE) 10 MG tablet Take 1 tablet (10 mg total) by mouth every 6 (six) hours as needed for nausea or vomiting. 40 tablet 2  . traMADol (ULTRAM) 50 MG tablet Take 1 tablet (50 mg total) by mouth every 6 (six) hours as needed. 30 tablet 0  . potassium chloride SA (K-DUR,KLOR-CON) 20 MEQ tablet Take 1 tablet (20 mEq total) by mouth 2 (two) times daily. 90 tablet 2   No current facility-administered medications for this visit.    Facility-Administered Medications Ordered in Other Visits  Medication Dose Route Frequency Provider Last Rate Last Dose  . fluorouracil (ADRUCIL) 3,650 mg in sodium chloride 0.9 % 77 mL chemo infusion  2,200 mg/m2 (Treatment Plan Recorded) Intravenous 1 day or 1 dose Morgan Merle, MD   3,650 mg at 12/20/17 1724    REVIEW OF SYSTEMS:   Constitutional: Denies fevers, chills or abnormal night sweats (+) fatigue  (+) low appetite (+) weight loss Eyes: Denies blurriness of vision, double vision or watery  eyes Ears, nose, mouth, throat, and face: Denies mucositis or sore throat Respiratory: Denies cough, dyspnea or wheezes Cardiovascular: Denies palpitation, chest discomfort or lower extremity swelling Gastrointestinal:  (+) nausea (+) constipation (+) diarrhea Skin: Denies abnormal skin rashes (+) tender knots down lower legs bilaterally Lymphatics: Denies new lymphadenopathy or easy bruising Neurological:Denies numbness, tingling or new weaknesses Behavioral/Psych: Mood is stable, no new changes  All other systems were reviewed with the patient and are negative.  PHYSICAL EXAMINATION:  ECOG PERFORMANCE STATUS: 1-2 BP 140/80 (BP Location: Left Arm, Patient Position: Sitting)   Pulse 85   Temp 98.7 F (37.1 C) (Oral)   Resp 18   Ht 5' 5"  (1.651 m)   Wt 111 lb 6.4 oz (50.5 kg)   LMP 10/23/2015   SpO2 100%   BMI 18.54 kg/m   GENERAL:alert, no distress and comfortable SKIN: skin color, texture, turgor are normal, no rashes or significant lesions EYES: normal, conjunctiva are pink and non-injected, sclera clear OROPHARYNX:no exudate, no erythema and lips, buccal mucosa, and tongue normal  NECK: supple, thyroid normal size, non-tender, without nodularity LYMPH:  no palpable lymphadenopathy in the cervical, axillary or inguinal LUNGS: clear to auscultation and percussion with normal breathing effort HEART: regular rate & rhythm and no murmurs and no lower extremity edema ABDOMEN:abdomen soft, normal bowel sounds. No hepatomegaly or tenderness on palpation (+) Dawson hernia in the left inguial area Musculoskeletal:no cyanosis of digits and no clubbing  PSYCH: alert & oriented x 3 with fluent speech NEURO: no focal motor/sensory deficits  LABORATORY DATA:  I have reviewed the data as listed CBC Latest Ref Rng & Units 12/20/2017 12/06/2017 11/22/2017  WBC 3.9 - 10.3 K/uL 13.6(H) 9.9 2.9(L)  Hemoglobin 11.6 - 15.9 g/dL 8.9(L) 9.7(L) 10.9(L)  Hematocrit 34.8 -  46.6 % 26.8(L) 29.7(L) 32.2(L)    Platelets 145 - 400 K/uL 126(L) 110(L) 170    CMP Latest Ref Rng & Units 12/20/2017 12/06/2017 11/22/2017  Glucose 70 - 140 mg/dL 109 109 116  BUN 7 - 26 mg/dL 12 13 14   Creatinine 0.60 - 1.10 mg/dL 1.52(H) 1.75(H) 2.04(H)  Sodium 136 - 145 mmol/L 142 141 141  Potassium 3.5 - 5.1 mmol/L 3.0(LL) 3.2(L) 2.9(LL)  Chloride 98 - 109 mmol/L 106 104 103  CO2 22 - 29 mmol/L 26 27 27   Calcium 8.4 - 10.4 mg/dL 9.0 9.6 9.9  Total Protein 6.4 - 8.3 g/dL 7.1 7.2 7.4  Total Bilirubin 0.2 - 1.2 mg/dL 0.5 0.5 0.5  Alkaline Phos 40 - 150 U/L 303(H) 293(H) 278(H)  AST 5 - 34 U/L 70(H) 62(H) 69(H)  ALT 0 - 55 U/L 46 44 54    PATHOLOGY  Liver Biopsy  Diagnosis 08/05/17 Liver, needle/core biopsy - CARCINOMA. - SEE COMMENT. Microscopic Comment The malignant cells are positive for cytokeratin 7. They are negative for arginase, CDX2, cytokeratin 20, estrogen receptor, GATA-3, GCDFP, Glypican 3, Hep Par 1, Napsin A, and TTF-1. This immunohistochemical is nonspecific. Possibly primary sources include pancreatobiliary and upper gastrointestinal. Radiologic correlation is necessary. Of note, organ specific markers (GATA-3, GCDFP-breast, TTF-1, Napsin A-lung, and CDX2-colon) are negative. (JBK:ecj 08/09/2017)   Diagnosis 03/07/13 Surgical, cecum, polyp -TUBULAR ADENOMA -NEGATIVE FOR HIGH GRADE DYSPLASIA   PROCEDURES  Colonoscopy by Dr. Paulita Dawson 03/07/13 IMPRESSION:  -One 5 m polyp in the cecum. Resected and retrieved -The distal rectum and anal verge are normal on retroflexion view.  -The examination was otherwise normal.    RADIOGRAPHIC STUDIES: I have personally reviewed the radiological images as listed and agreed with the findings in the report. Nm Pet Image Restag (ps) Skull Base To Thigh  Result Date: 12/17/2017 CLINICAL DATA:  Subsequent treatment strategy for metastatic pancreatic cancer diagnosed 07/23/2017. EXAM: NUCLEAR MEDICINE PET SKULL BASE TO THIGH TECHNIQUE: 5.64 mCi F-18 FDG was  injected intravenously. Full-ring PET imaging was performed from the skull base to thigh after the radiotracer. CT data was obtained and used for attenuation correction and anatomic localization. Fasting blood glucose: 108 mg/dl COMPARISON:  PET-CT 07/29/2017. Chest, abdomen and pelvic CT 10/14/2017. FINDINGS: Mediastinal blood pool activity: SUV max 2.0 NECK: No hypermetabolic cervical lymph nodes are identified.There are no lesions of the pharyngeal mucosal space. Incidental CT findings: none CHEST: There are no hypermetabolic mediastinal, hilar or axillary lymph nodes. There is no hypermetabolic pulmonary activity. Incidental CT findings: Stable tiny calcified right upper lobe granuloma. There is mild atelectasis at both lung bases. Right IJ Port-A-Cath extends to the superior cavoatrial junction. ABDOMEN/PELVIS: Compared with the prior studies, the extensive hepatic metastatic disease has decreased in size. There is no residual hypermetabolic activity associated with any of the lesions. Index lesion in the left hepatic lobe measures 3.6 x 4.8 cm on image 104/4 (8.3 x 6.0 cm on prior PET-CT). No residual abnormal activity within the pancreas. The spleen kidneys appear unremarkable. There is no hypermetabolic nodal activity in the abdomen or pelvis. Incidental CT findings: Cholelithiasis. SKELETON: There is no hypermetabolic activity to suggest osseous metastatic disease. Generalized marrow activity attributed to interval therapy. Incidental CT findings: Chronic bilateral L5 pars defects. IMPRESSION: 1. Although the liver metastases are still visible on the CT images, they are significantly smaller and retain no abnormal metabolic activity, consistent with response to therapy. 2. No abnormal activity within the pancreas. 3. No disease progression identified.  4. Cholelithiasis. Electronically Signed   By: Richardean Sale M.D.   On: 12/17/2017 14:16   PET Scan 12/17/17 IMPRESSION: 1. Although the liver metastases  are still visible on the CT images, they are significantly smaller and retain no abnormal metabolic activity, consistent with response to therapy. 2. No abnormal activity within the pancreas. 3. No disease progression identified. 4. Cholelithiasis.  CT CAP WO Contrast 10/14/17 IMPRESSION: Evidence of known numerous liver metastases without significant interval change. No other evidence of metastatic disease within the chest, abdomen or pelvis. Cholelithiasis. Tiny pericardial effusion.   ASSESSMENT & PLAN:  Morgan Dawson is a 57 y.o. caucasian female with a history of vertigo and menieres disease, presented with epigastric discomfort, low appetite and 5 pound weight loss   1. 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  07/29/17 PET scan shows diffuse metastatic disease in her enlarged liver.  This is most consistent with diffuse liver metastasis.  -Her liver biopsy confirmed metastatic adenocarcinoma, most consistent with biliary or pancreatic primary this was discussed with patient.  -After further reviewing her 07/29/17 PET in the GI Tumor Board, the initial uptake in the duodenum is felt to be in the pancrease tail, which represents the primary tumor.  The consensus from GI tumor board is this is most consistent with metastatic pancreatic adenocarcinoma to liver.  We felt she does not need further imaging or EUS to confirm the primary site.  -Given her metastatic stage IV disease, her metastatic cancer is unfortunately not curable, and the goal of therapy is palliative, to prolong her life and improve her quality of life.  -Goal of therapy is to control the disease.  -she started first line FOLFIRINOX on 08/21/17, -She has been tolerating chemotherapy moderately well, with some nausea, cold sensitivity, diarrhea, and fatigue, she has developed slightly  worsening renal function, has received IV fluids intermittently  -CT CAP WO Contrast from 10/14/17 reveals stable disease, liver metastases without significant interval change. No other evidence of metastatic disease within the chest, abdomen or pelvis. I discussed results with pt. due to her chronic kidney disease, she is not able to receive IV contrast, which limits the quality of CT,  I may consider PET scan in the future.  -Her CA 19.9 continues to decrease. This is a good sign that her regimen is working to control her disease.  -her Pet scan from 12/17/17 revealed that her liver mets are significantly smaller and retain no abnormal metabolic activity. No new lesions identified. I discussed these results with the pt and her husband. They are very pleased.  -Plan for her to continue this regimen until her neuropathy worsens I can remove Oxaliplatin and switch to 5-fu and liposomal Irinotecan.  -She will go to [place] in July and we will give her a chemo break at this time  -Labs reviewed, adequate to continue with Cycle 9 FOLFIRINOX today and continue every 2 weeks  -F/u in 2 weeks   2. Pancreatitis -Found by 07/23/17 CT scan, likely related to her cancer.  -On bland diet -I previously suggested Prilosec and to continue Dawson meals with bland diet. If needed I can give pain medication. -Abdominal pain is mild, no need for pain medication at this time. She was taking tylenol up to TID.   3. Nausea, and diarrhea  -She has had nausea since her cancer diagnosis, she knows  to use Zofran and Compazine alternatively for nausea as needed off chemo -She had severe nausea and vomiting towards the end of the chemo infusion cycle 2 -Emend was added with cycle 3, her n/v are overall improved from previous cycles. She had 2 days of mild n/v after pump d/c, managed with zofran and compazine. She will continue current supportive regimen. Will monitor.  -She has mild diarrhea after chemo, has not needed  antidiarrheal medication  4. Anorexia, weight loss -She has anxiety about eating due to emesis.  -I previously encouraged her to eat more Dawson meals with bland or liquid diet. She can increase her intake of ensure boost -Follow-up with dietitian -weight stable lately   5. Hypercalcemia -Secondary to her underlying malignancy -Received Zometa and IV hydration in December 2018, hypercalcemia has resolved now. -Okay to restart dairy products, she is off calcium supplement. -She has cramps as of late, her Ca is 9.9 on 2/25 and 3/18, okay to restart calcium supplement for leg cramps. She can also take magnesium.  6.  Transaminitis -Due to her underlying liver metastasis -Improved since she started chemo -Monitoring  7. Goal of care discussion  -We previously discussed the incurable nature of her cancer, and the overall poor prognosis, especially if she does not have good response to chemotherapy or progress on chemo -The patient understands the goal of care is palliative. -I previously recommended DNR/DNI, she will think about it   8. Hypokalemia, CKD stage III -She takes 10-40 mEq on a given day depending on her ability to swallow pills. K was 3.0 10/04/17. She does not have heavy GI output. I recommended she take 20 mEq daily consistently.  -Creatinine continues to increase; she appears adequately hydrated. Could be related to chemotherapy. Will check mag to make sure it is normal. Will obtain uric acid to r/o tumor lysis.  -will check labs closely, add on with pump d/c on day 3 -her Cr is 1.97 on (10/18/17), so she received IVF, her K was 3.2, She can increase to 3 potassium supplements for the next 3 days.  -Cr remains presistently high, I stopped her spironolactone on 11/01/17 - US renal from 11/08/17 revealed hyperechoic renal cortex bilaterally without hydronephrosis -K is 3.1 (11/15/17) she will increase K supplement to three times daily -90 day prescription sent in today  -May  consider restart spironolactone in the future if her renal function improves   9. Meniere's disease -She had recent meniere's flare with dizziness since last cycle. She is not currently on daily treatment; takes meclizine PRN. I previously discussed with ENT, she was prescribed aldactone. She has been holding due to increased creatinine.   10. Genetics -She previously met with genetic counselor and test was not recommended  -I informed pt today that testing will be beneficial to know if she will be a candidate for other drugs in the future if she is tested positive for Lynch Syndrome or BRCA1/BRCA2 mutation carrier  -Her genetic testing (83 cancer related genes) was negative   PLAN:  -PET Scan reviewed with pt and her husband, her liver mets are significantly smaller and retain no abnormal metabolic activity. She has had completed metabolic response -Labs reviewed, adequate to proceed with Cycle 9 FOLFIRINOX today -90 day K supplement filled. She is taking TID -Lab and f/u with Cycle 10 in 2 weeks   No orders of the defined types were placed in this encounter.   All questions were answered. The patient knows to call the clinic  with any problems, questions or concerns. I spent 20 minutes counseling the patient face to face. The total time spent in the appointment was 25 minutes and more than 50% was on counseling.  This document serves as a record of services personally performed by Morgan Merle, MD. It was created on her behalf by Theresia Bough, a trained medical scribe. The creation of this record is based on the scribe's personal observations and the provider's statements to them.   I have reviewed the above documentation for accuracy and completeness, and I agree with the above.    Morgan Merle, MD 12/20/2017

## 2017-12-20 ENCOUNTER — Inpatient Hospital Stay: Payer: 59

## 2017-12-20 ENCOUNTER — Other Ambulatory Visit: Payer: 59

## 2017-12-20 ENCOUNTER — Telehealth: Payer: Self-pay

## 2017-12-20 ENCOUNTER — Encounter: Payer: Self-pay | Admitting: Hematology

## 2017-12-20 ENCOUNTER — Inpatient Hospital Stay (HOSPITAL_BASED_OUTPATIENT_CLINIC_OR_DEPARTMENT_OTHER): Payer: 59 | Admitting: Hematology

## 2017-12-20 VITALS — BP 140/80 | HR 85 | Temp 98.7°F | Resp 18 | Ht 65.0 in | Wt 111.4 lb

## 2017-12-20 DIAGNOSIS — T451X5A Adverse effect of antineoplastic and immunosuppressive drugs, initial encounter: Secondary | ICD-10-CM

## 2017-12-20 DIAGNOSIS — K859 Acute pancreatitis without necrosis or infection, unspecified: Secondary | ICD-10-CM | POA: Diagnosis not present

## 2017-12-20 DIAGNOSIS — H8109 Meniere's disease, unspecified ear: Secondary | ICD-10-CM

## 2017-12-20 DIAGNOSIS — N183 Chronic kidney disease, stage 3 unspecified: Secondary | ICD-10-CM

## 2017-12-20 DIAGNOSIS — R112 Nausea with vomiting, unspecified: Secondary | ICD-10-CM | POA: Diagnosis not present

## 2017-12-20 DIAGNOSIS — R630 Anorexia: Secondary | ICD-10-CM

## 2017-12-20 DIAGNOSIS — C251 Malignant neoplasm of body of pancreas: Secondary | ICD-10-CM

## 2017-12-20 DIAGNOSIS — Z5111 Encounter for antineoplastic chemotherapy: Secondary | ICD-10-CM | POA: Diagnosis not present

## 2017-12-20 DIAGNOSIS — C259 Malignant neoplasm of pancreas, unspecified: Secondary | ICD-10-CM

## 2017-12-20 DIAGNOSIS — R197 Diarrhea, unspecified: Secondary | ICD-10-CM | POA: Diagnosis not present

## 2017-12-20 DIAGNOSIS — R634 Abnormal weight loss: Secondary | ICD-10-CM

## 2017-12-20 DIAGNOSIS — C787 Secondary malignant neoplasm of liver and intrahepatic bile duct: Secondary | ICD-10-CM

## 2017-12-20 DIAGNOSIS — R740 Nonspecific elevation of levels of transaminase and lactic acid dehydrogenase [LDH]: Secondary | ICD-10-CM | POA: Diagnosis not present

## 2017-12-20 DIAGNOSIS — D6481 Anemia due to antineoplastic chemotherapy: Secondary | ICD-10-CM

## 2017-12-20 DIAGNOSIS — E876 Hypokalemia: Secondary | ICD-10-CM

## 2017-12-20 DIAGNOSIS — Z7189 Other specified counseling: Secondary | ICD-10-CM

## 2017-12-20 LAB — CBC WITH DIFFERENTIAL/PLATELET
BASOS ABS: 0 10*3/uL (ref 0.0–0.1)
Basophils Relative: 0 %
EOS PCT: 1 %
Eosinophils Absolute: 0.1 10*3/uL (ref 0.0–0.5)
HCT: 26.8 % — ABNORMAL LOW (ref 34.8–46.6)
Hemoglobin: 8.9 g/dL — ABNORMAL LOW (ref 11.6–15.9)
LYMPHS ABS: 1.3 10*3/uL (ref 0.9–3.3)
LYMPHS PCT: 10 %
MCH: 35 pg — AB (ref 25.1–34.0)
MCHC: 33.2 g/dL (ref 31.5–36.0)
MCV: 105.5 fL — AB (ref 79.5–101.0)
MONO ABS: 1.7 10*3/uL — AB (ref 0.1–0.9)
Monocytes Relative: 12 %
Neutro Abs: 10.5 10*3/uL — ABNORMAL HIGH (ref 1.5–6.5)
Neutrophils Relative %: 77 %
PLATELETS: 126 10*3/uL — AB (ref 145–400)
RBC: 2.54 MIL/uL — ABNORMAL LOW (ref 3.70–5.45)
RDW: 16 % — AB (ref 11.2–14.5)
WBC: 13.6 10*3/uL — ABNORMAL HIGH (ref 3.9–10.3)

## 2017-12-20 LAB — COMPREHENSIVE METABOLIC PANEL
ALBUMIN: 3.3 g/dL — AB (ref 3.5–5.0)
ALT: 46 U/L (ref 0–55)
AST: 70 U/L — AB (ref 5–34)
Alkaline Phosphatase: 303 U/L — ABNORMAL HIGH (ref 40–150)
Anion gap: 10 (ref 3–11)
BILIRUBIN TOTAL: 0.5 mg/dL (ref 0.2–1.2)
BUN: 12 mg/dL (ref 7–26)
CO2: 26 mmol/L (ref 22–29)
Calcium: 9 mg/dL (ref 8.4–10.4)
Chloride: 106 mmol/L (ref 98–109)
Creatinine, Ser: 1.52 mg/dL — ABNORMAL HIGH (ref 0.60–1.10)
GFR calc Af Amer: 43 mL/min — ABNORMAL LOW (ref >60)
GFR calc non Af Amer: 38 mL/min — ABNORMAL LOW (ref >60)
GLUCOSE: 109 mg/dL (ref 70–140)
POTASSIUM: 3 mmol/L — AB (ref 3.5–5.1)
Sodium: 142 mmol/L (ref 136–145)
TOTAL PROTEIN: 7.1 g/dL (ref 6.4–8.3)

## 2017-12-20 LAB — LIPASE, BLOOD: Lipase: 90 U/L — ABNORMAL HIGH (ref 11–51)

## 2017-12-20 MED ORDER — SODIUM CHLORIDE 0.9 % IV SOLN
Freq: Once | INTRAVENOUS | Status: AC
  Administered 2017-12-20: 12:00:00 via INTRAVENOUS
  Filled 2017-12-20: qty 5

## 2017-12-20 MED ORDER — DEXTROSE 5 % IV SOLN
140.0000 mg/m2 | Freq: Once | INTRAVENOUS | Status: AC
  Administered 2017-12-20: 220 mg via INTRAVENOUS
  Filled 2017-12-20: qty 11

## 2017-12-20 MED ORDER — PALONOSETRON HCL INJECTION 0.25 MG/5ML
INTRAVENOUS | Status: AC
  Filled 2017-12-20: qty 5

## 2017-12-20 MED ORDER — PALONOSETRON HCL INJECTION 0.25 MG/5ML
0.2500 mg | Freq: Once | INTRAVENOUS | Status: AC
  Administered 2017-12-20: 0.25 mg via INTRAVENOUS

## 2017-12-20 MED ORDER — ATROPINE SULFATE 1 MG/ML IJ SOLN
INTRAMUSCULAR | Status: AC
  Filled 2017-12-20: qty 1

## 2017-12-20 MED ORDER — DEXTROSE 5 % IV SOLN
Freq: Once | INTRAVENOUS | Status: AC
  Administered 2017-12-20: 12:00:00 via INTRAVENOUS

## 2017-12-20 MED ORDER — OXALIPLATIN CHEMO INJECTION 100 MG/20ML
70.0000 mg/m2 | Freq: Once | INTRAVENOUS | Status: AC
  Administered 2017-12-20: 115 mg via INTRAVENOUS
  Filled 2017-12-20: qty 3

## 2017-12-20 MED ORDER — POTASSIUM CHLORIDE CRYS ER 20 MEQ PO TBCR: 20.0000 meq | EXTENDED_RELEASE_TABLET | Freq: Two times a day (BID) | ORAL | 2 refills | Status: DC

## 2017-12-20 MED ORDER — ATROPINE SULFATE 1 MG/ML IJ SOLN
0.5000 mg | Freq: Once | INTRAMUSCULAR | Status: AC | PRN
  Administered 2017-12-20: 0.5 mg via INTRAVENOUS

## 2017-12-20 MED ORDER — LEUCOVORIN CALCIUM INJECTION 350 MG
400.0000 mg/m2 | Freq: Once | INTRAVENOUS | Status: AC
  Administered 2017-12-20: 668 mg via INTRAVENOUS
  Filled 2017-12-20: qty 33.4

## 2017-12-20 MED ORDER — SODIUM CHLORIDE 0.9 % IV SOLN
2200.0000 mg/m2 | INTRAVENOUS | Status: DC
  Administered 2017-12-20: 3650 mg via INTRAVENOUS
  Filled 2017-12-20: qty 73

## 2017-12-20 NOTE — Telephone Encounter (Signed)
Dr. Burr Medico aware potassium level 3.0

## 2017-12-20 NOTE — Patient Instructions (Signed)
[facility] Discharge Instructions for Patients Receiving Chemotherapy  Today you received the following chemotherapy agents Oxaliplatin, Irinotecan, Leucovorin, and 5FU  To help prevent nausea and vomiting after your treatment, we encourage you to take your nausea medication as directed   If you develop nausea and vomiting that is not controlled by your nausea medication, call the clinic.   BELOW ARE SYMPTOMS THAT SHOULD BE REPORTED IMMEDIATELY:  *FEVER GREATER THAN 100.5 F  *CHILLS WITH OR WITHOUT FEVER  NAUSEA AND VOMITING THAT IS NOT CONTROLLED WITH YOUR NAUSEA MEDICATION  *UNUSUAL SHORTNESS OF BREATH  *UNUSUAL BRUISING OR BLEEDING  TENDERNESS IN MOUTH AND THROAT WITH OR WITHOUT PRESENCE OF ULCERS  *URINARY PROBLEMS  *BOWEL PROBLEMS  UNUSUAL RASH Items with * indicate a potential emergency and should be followed up as soon as possible.  Feel free to call the clinic should you have any questions or concerns. The clinic phone number is [phone].  Please show the CHEMO ALERT CARD at check-in to the Emergency Department and triage nurse.   

## 2017-12-20 NOTE — Patient Instructions (Signed)
Implanted Port Home Guide An implanted port is a type of central line that is placed under the skin. Central lines are used to provide IV access when treatment or nutrition needs to be given through a person's veins. Implanted ports are used for long-term IV access. An implanted port may be placed because:  You need IV medicine that would be irritating to the small veins in your hands or arms.  You need long-term IV medicines, such as antibiotics.  You need IV nutrition for a long period.  You need frequent blood draws for lab tests.  You need dialysis.  Implanted ports are usually placed in the chest area, but they can also be placed in the upper arm, the abdomen, or the leg. An implanted port has two main parts:  Reservoir. The reservoir is round and will appear as a small, raised area under your skin. The reservoir is the part where a needle is inserted to give medicines or draw blood.  Catheter. The catheter is a thin, flexible tube that extends from the reservoir. The catheter is placed into a large vein. Medicine that is inserted into the reservoir goes into the catheter and then into the vein.  How will I care for my incision site? Do not get the incision site wet. Bathe or shower as directed by your health care provider. How is my port accessed? Special steps must be taken to access the port:  Before the port is accessed, a numbing cream can be placed on the skin. This helps numb the skin over the port site.  Your health care provider uses a sterile technique to access the port. ? Your health care provider must put on a mask and sterile gloves. ? The skin over your port is cleaned carefully with an antiseptic and allowed to dry. ? The port is gently pinched between sterile gloves, and a needle is inserted into the port.  Only "non-coring" port needles should be used to access the port. Once the port is accessed, a blood return should be checked. This helps ensure that the port  is in the vein and is not clogged.  If your port needs to remain accessed for a constant infusion, a clear (transparent) bandage will be placed over the needle site. The bandage and needle will need to be changed every week, or as directed by your health care provider.  Keep the bandage covering the needle clean and dry. Do not get it wet. Follow your health care provider's instructions on how to take a shower or bath while the port is accessed.  If your port does not need to stay accessed, no bandage is needed over the port.  What is flushing? Flushing helps keep the port from getting clogged. Follow your health care provider's instructions on how and when to flush the port. Ports are usually flushed with saline solution or a medicine called heparin. The need for flushing will depend on how the port is used.  If the port is used for intermittent medicines or blood draws, the port will need to be flushed: ? After medicines have been given. ? After blood has been drawn. ? As part of routine maintenance.  If a constant infusion is running, the port may not need to be flushed.  How long will my port stay implanted? The port can stay in for as long as your health care provider thinks it is needed. When it is time for the port to come out, surgery will be   done to remove it. The procedure is similar to the one performed when the port was put in. When should I seek immediate medical care? When you have an implanted port, you should seek immediate medical care if:  You notice a bad smell coming from the incision site.  You have swelling, redness, or drainage at the incision site.  You have more swelling or pain at the port site or the surrounding area.  You have a fever that is not controlled with medicine.  This information is not intended to replace advice given to you by your health care provider. Make sure you discuss any questions you have with your health care provider. Document  Released: 08/10/2005 Document Revised: 01/16/2016 Document Reviewed: 04/17/2013 Elsevier Interactive Patient Education  2017 Elsevier Inc.  

## 2017-12-20 NOTE — Progress Notes (Signed)
Prior auth for Potassium Chloride 20 mEq tablets has been submitted. Status is pending.

## 2017-12-21 DIAGNOSIS — C259 Malignant neoplasm of pancreas, unspecified: Secondary | ICD-10-CM | POA: Diagnosis not present

## 2017-12-21 LAB — CANCER ANTIGEN 19-9: CAN 19-9: 28 U/mL (ref 0–35)

## 2017-12-22 ENCOUNTER — Inpatient Hospital Stay: Payer: 59 | Attending: Hematology

## 2017-12-22 VITALS — BP 126/77 | HR 82 | Temp 98.8°F | Resp 18

## 2017-12-22 DIAGNOSIS — D701 Agranulocytosis secondary to cancer chemotherapy: Secondary | ICD-10-CM | POA: Insufficient documentation

## 2017-12-22 DIAGNOSIS — C259 Malignant neoplasm of pancreas, unspecified: Secondary | ICD-10-CM | POA: Insufficient documentation

## 2017-12-22 DIAGNOSIS — G620 Drug-induced polyneuropathy: Secondary | ICD-10-CM | POA: Diagnosis not present

## 2017-12-22 DIAGNOSIS — R197 Diarrhea, unspecified: Secondary | ICD-10-CM | POA: Insufficient documentation

## 2017-12-22 DIAGNOSIS — Z7189 Other specified counseling: Secondary | ICD-10-CM

## 2017-12-22 DIAGNOSIS — Z5111 Encounter for antineoplastic chemotherapy: Secondary | ICD-10-CM | POA: Diagnosis not present

## 2017-12-22 DIAGNOSIS — R740 Nonspecific elevation of levels of transaminase and lactic acid dehydrogenase [LDH]: Secondary | ICD-10-CM | POA: Insufficient documentation

## 2017-12-22 DIAGNOSIS — N183 Chronic kidney disease, stage 3 (moderate): Secondary | ICD-10-CM | POA: Insufficient documentation

## 2017-12-22 DIAGNOSIS — R110 Nausea: Secondary | ICD-10-CM | POA: Diagnosis not present

## 2017-12-22 DIAGNOSIS — H8109 Meniere's disease, unspecified ear: Secondary | ICD-10-CM | POA: Insufficient documentation

## 2017-12-22 DIAGNOSIS — C787 Secondary malignant neoplasm of liver and intrahepatic bile duct: Secondary | ICD-10-CM | POA: Insufficient documentation

## 2017-12-22 DIAGNOSIS — T451X5A Adverse effect of antineoplastic and immunosuppressive drugs, initial encounter: Secondary | ICD-10-CM | POA: Diagnosis not present

## 2017-12-22 DIAGNOSIS — E876 Hypokalemia: Secondary | ICD-10-CM | POA: Diagnosis not present

## 2017-12-22 DIAGNOSIS — Z5189 Encounter for other specified aftercare: Secondary | ICD-10-CM | POA: Diagnosis not present

## 2017-12-22 DIAGNOSIS — R5081 Fever presenting with conditions classified elsewhere: Secondary | ICD-10-CM | POA: Insufficient documentation

## 2017-12-22 MED ORDER — SODIUM CHLORIDE 0.9% FLUSH
10.0000 mL | INTRAVENOUS | Status: DC | PRN
  Administered 2017-12-22: 10 mL
  Filled 2017-12-22: qty 10

## 2017-12-22 MED ORDER — PEGFILGRASTIM INJECTION 6 MG/0.6ML ~~LOC~~
6.0000 mg | PREFILLED_SYRINGE | Freq: Once | SUBCUTANEOUS | Status: AC
  Administered 2017-12-22: 6 mg via SUBCUTANEOUS

## 2017-12-22 MED ORDER — PEGFILGRASTIM INJECTION 6 MG/0.6ML ~~LOC~~
PREFILLED_SYRINGE | SUBCUTANEOUS | Status: AC
  Filled 2017-12-22: qty 0.6

## 2017-12-22 MED ORDER — HEPARIN SOD (PORK) LOCK FLUSH 100 UNIT/ML IV SOLN
500.0000 [IU] | Freq: Once | INTRAVENOUS | Status: AC | PRN
  Administered 2017-12-22: 500 [IU]
  Filled 2017-12-22: qty 5

## 2017-12-24 NOTE — Progress Notes (Signed)
No prior auth needed for potassium-chloride. Med is covered and has been approved.

## 2017-12-28 ENCOUNTER — Telehealth: Payer: Self-pay | Admitting: Nurse Practitioner

## 2017-12-28 NOTE — Telephone Encounter (Signed)
Scheduled appt per 5/2 sch message - patient is aware of appt date and time.

## 2018-01-03 ENCOUNTER — Telehealth: Payer: Self-pay | Admitting: Nurse Practitioner

## 2018-01-03 ENCOUNTER — Inpatient Hospital Stay: Payer: 59

## 2018-01-03 ENCOUNTER — Inpatient Hospital Stay (HOSPITAL_BASED_OUTPATIENT_CLINIC_OR_DEPARTMENT_OTHER): Payer: 59 | Admitting: Nurse Practitioner

## 2018-01-03 ENCOUNTER — Encounter: Payer: Self-pay | Admitting: Nurse Practitioner

## 2018-01-03 VITALS — BP 137/93 | HR 94 | Temp 98.3°F | Resp 18 | Ht 65.0 in | Wt 107.3 lb

## 2018-01-03 DIAGNOSIS — R740 Nonspecific elevation of levels of transaminase and lactic acid dehydrogenase [LDH]: Secondary | ICD-10-CM

## 2018-01-03 DIAGNOSIS — C259 Malignant neoplasm of pancreas, unspecified: Secondary | ICD-10-CM

## 2018-01-03 DIAGNOSIS — R634 Abnormal weight loss: Secondary | ICD-10-CM | POA: Diagnosis not present

## 2018-01-03 DIAGNOSIS — C787 Secondary malignant neoplasm of liver and intrahepatic bile duct: Principal | ICD-10-CM

## 2018-01-03 DIAGNOSIS — R110 Nausea: Secondary | ICD-10-CM

## 2018-01-03 DIAGNOSIS — R197 Diarrhea, unspecified: Secondary | ICD-10-CM

## 2018-01-03 DIAGNOSIS — H8109 Meniere's disease, unspecified ear: Secondary | ICD-10-CM

## 2018-01-03 DIAGNOSIS — R630 Anorexia: Secondary | ICD-10-CM

## 2018-01-03 DIAGNOSIS — C251 Malignant neoplasm of body of pancreas: Secondary | ICD-10-CM

## 2018-01-03 DIAGNOSIS — Z7189 Other specified counseling: Secondary | ICD-10-CM

## 2018-01-03 DIAGNOSIS — Z95828 Presence of other vascular implants and grafts: Secondary | ICD-10-CM

## 2018-01-03 DIAGNOSIS — E876 Hypokalemia: Secondary | ICD-10-CM

## 2018-01-03 DIAGNOSIS — R209 Unspecified disturbances of skin sensation: Secondary | ICD-10-CM

## 2018-01-03 DIAGNOSIS — N183 Chronic kidney disease, stage 3 unspecified: Secondary | ICD-10-CM

## 2018-01-03 DIAGNOSIS — Z5111 Encounter for antineoplastic chemotherapy: Secondary | ICD-10-CM | POA: Diagnosis not present

## 2018-01-03 LAB — CBC WITH DIFFERENTIAL/PLATELET
BASOS ABS: 0 10*3/uL (ref 0.0–0.1)
Basophils Relative: 0 %
EOS PCT: 1 %
Eosinophils Absolute: 0.1 10*3/uL (ref 0.0–0.5)
HCT: 26.4 % — ABNORMAL LOW (ref 34.8–46.6)
Hemoglobin: 8.9 g/dL — ABNORMAL LOW (ref 11.6–15.9)
LYMPHS ABS: 1.2 10*3/uL (ref 0.9–3.3)
LYMPHS PCT: 8 %
MCH: 35.2 pg — AB (ref 25.1–34.0)
MCHC: 33.7 g/dL (ref 31.5–36.0)
MCV: 104.3 fL — AB (ref 79.5–101.0)
MONO ABS: 1.8 10*3/uL — AB (ref 0.1–0.9)
Monocytes Relative: 12 %
Neutro Abs: 12.3 10*3/uL — ABNORMAL HIGH (ref 1.5–6.5)
Neutrophils Relative %: 79 %
PLATELETS: 120 10*3/uL — AB (ref 145–400)
RBC: 2.53 MIL/uL — ABNORMAL LOW (ref 3.70–5.45)
RDW: 16.8 % — AB (ref 11.2–14.5)
WBC: 15.5 10*3/uL — ABNORMAL HIGH (ref 3.9–10.3)

## 2018-01-03 LAB — COMPREHENSIVE METABOLIC PANEL
ALT: 49 U/L (ref 0–55)
AST: 66 U/L — ABNORMAL HIGH (ref 5–34)
Albumin: 3.5 g/dL (ref 3.5–5.0)
Alkaline Phosphatase: 354 U/L — ABNORMAL HIGH (ref 40–150)
Anion gap: 11 (ref 3–11)
BUN: 10 mg/dL (ref 7–26)
CHLORIDE: 105 mmol/L (ref 98–109)
CO2: 25 mmol/L (ref 22–29)
Calcium: 8.3 mg/dL — ABNORMAL LOW (ref 8.4–10.4)
Creatinine, Ser: 1.54 mg/dL — ABNORMAL HIGH (ref 0.60–1.10)
GFR, EST AFRICAN AMERICAN: 43 mL/min — AB (ref >60)
GFR, EST NON AFRICAN AMERICAN: 37 mL/min — AB (ref >60)
Glucose, Bld: 114 mg/dL (ref 70–140)
POTASSIUM: 2.9 mmol/L — AB (ref 3.5–5.1)
Sodium: 141 mmol/L (ref 136–145)
Total Bilirubin: 0.7 mg/dL (ref 0.2–1.2)
Total Protein: 7.4 g/dL (ref 6.4–8.3)

## 2018-01-03 MED ORDER — DEXTROSE 5 % IV SOLN
400.0000 mg/m2 | Freq: Once | INTRAVENOUS | Status: AC
  Administered 2018-01-03: 668 mg via INTRAVENOUS
  Filled 2018-01-03: qty 33.4

## 2018-01-03 MED ORDER — FOSAPREPITANT DIMEGLUMINE INJECTION 150 MG
Freq: Once | INTRAVENOUS | Status: AC
  Administered 2018-01-03: 12:00:00 via INTRAVENOUS
  Filled 2018-01-03: qty 5

## 2018-01-03 MED ORDER — IRINOTECAN HCL CHEMO INJECTION 100 MG/5ML
140.0000 mg/m2 | Freq: Once | INTRAVENOUS | Status: AC
  Administered 2018-01-03: 220 mg via INTRAVENOUS
  Filled 2018-01-03: qty 11

## 2018-01-03 MED ORDER — DEXTROSE 5 % IV SOLN
Freq: Once | INTRAVENOUS | Status: AC
  Administered 2018-01-03: 11:00:00 via INTRAVENOUS

## 2018-01-03 MED ORDER — POTASSIUM CHLORIDE 10 MEQ/100ML IV SOLN
10.0000 meq | Freq: Once | INTRAVENOUS | Status: AC
  Administered 2018-01-03: 10 meq via INTRAVENOUS
  Filled 2018-01-03: qty 100

## 2018-01-03 MED ORDER — ATROPINE SULFATE 1 MG/ML IJ SOLN
INTRAMUSCULAR | Status: AC
  Filled 2018-01-03: qty 1

## 2018-01-03 MED ORDER — POTASSIUM CHLORIDE 10 MEQ/100ML IV SOLN
10.0000 meq | INTRAVENOUS | Status: AC
  Administered 2018-01-03: 10 meq via INTRAVENOUS
  Filled 2018-01-03: qty 100

## 2018-01-03 MED ORDER — PALONOSETRON HCL INJECTION 0.25 MG/5ML
INTRAVENOUS | Status: AC
  Filled 2018-01-03: qty 5

## 2018-01-03 MED ORDER — SODIUM CHLORIDE 0.9 % IV SOLN
2200.0000 mg/m2 | INTRAVENOUS | Status: DC
  Administered 2018-01-03: 3650 mg via INTRAVENOUS
  Filled 2018-01-03: qty 73

## 2018-01-03 MED ORDER — DRONABINOL 2.5 MG PO CAPS: 2.5000 mg | ORAL_CAPSULE | Freq: Two times a day (BID) | ORAL | 0 refills | Status: DC

## 2018-01-03 MED ORDER — OXALIPLATIN CHEMO INJECTION 100 MG/20ML
70.0000 mg/m2 | Freq: Once | INTRAVENOUS | Status: AC
  Administered 2018-01-03: 115 mg via INTRAVENOUS
  Filled 2018-01-03: qty 23

## 2018-01-03 MED ORDER — PALONOSETRON HCL INJECTION 0.25 MG/5ML
0.2500 mg | Freq: Once | INTRAVENOUS | Status: AC
  Administered 2018-01-03: 0.25 mg via INTRAVENOUS

## 2018-01-03 MED ORDER — SODIUM CHLORIDE 0.9% FLUSH
10.0000 mL | Freq: Once | INTRAVENOUS | Status: AC
  Administered 2018-01-03: 10 mL
  Filled 2018-01-03: qty 10

## 2018-01-03 MED ORDER — ATROPINE SULFATE 1 MG/ML IJ SOLN
0.5000 mg | Freq: Once | INTRAMUSCULAR | Status: AC | PRN
  Administered 2018-01-03: 0.5 mg via INTRAVENOUS

## 2018-01-03 NOTE — Progress Notes (Signed)
Okay to treat with today's lab per. Cira Rue, NP per Dr. Burr Medico.   Paused Potassium infusion at 1148. Pre medications given, restarted potassium at 1213 and completed at 1225.

## 2018-01-03 NOTE — Telephone Encounter (Signed)
No LOS 5/13

## 2018-01-03 NOTE — Progress Notes (Signed)
K = 2.9.  Adding 20 mEq KCl IV x 2 runs (v.o. Cira Rue, NP) Kennith Center, Pharm.D., CPP 01/03/2018@10 :25 AM

## 2018-01-03 NOTE — Progress Notes (Signed)
[facility]  Telephone:([phone]) [phone] [phone]  Clinic Follow up Note   Patient Care Team: [name], MD as PCP - General (Family Medicine) [name], MD as Consulting Physician (Otolaryngology) 01/03/2018  SUMMARY OF ONCOLOGIC HISTORY: Oncology History   Cancer Staging Pancreatic cancer [facility]) Staging form: Exocrine Pancreas, AJCC 8th Edition - Clinical stage from 08/06/2017: Stage IV (cTX, cN0, pM1) - Signed by [name], MD on 08/12/2017       Pancreatic cancer ([place])   07/23/2017 Imaging    US abdomen limited RUQ 07/23/17 IMPRESSION: 1. Cholelithiasis.  No secondary signs of acute cholecystitis. 2. Multiple solid liver masses measuring up to 6.5 cm in the left lobe of the liver, evaluation for metastatic disease is recommended. These results will be called to the ordering clinician or representative by the Radiologist Assistant, and communication documented in the PACS or zVision Dashboard.      07/23/2017 Imaging    CT Abdomen W Contrast 07/23/17 IMPRESSION: 1. Widespread metastatic disease throughout the liver. No clear primary malignancy identified in the abdomen. The pelvis was not imaged. Tissue sampling recommended. 2. Probable adenopathy superior to the pancreatic tail. No evidence of pancreatic mass. 3. Suspected incidental hemangioma inferiorly in the right hepatic lobe. 4. Nonspecific nodularity in the breasts. The patient has undergone recent (03/25/2017 and 04/01/2017) mammography and ultrasound.      07/29/2017 Initial Diagnosis    Metastasis to liver of unknown origin ([place])      07/29/2017 PET scan    PET 07/29/17  IMPRESSION: 1. Numerous bulky liver masses are hypermetabolic compatible with malignancy. 2. Accentuated activity within or along the pancreatic tail, likely represent a primary pancreatic tumor. Consider pancreatic protocol MRI to further work this up. 3. The peripancreatic lymph node shown above the  pancreatic tail is mildly hypermetabolic favoring malignancy. 4.  Prominent stool throughout the colon favors constipation. 5. 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      08/05/2017 Pathology Results    Liver Biopsy  Diagnosis 08/05/17 Liver, needle/core biopsy - CARCINOMA. - SEE COMMENT. Microscopic Comment The malignant cells are positive for cytokeratin 7. They are negative for arginase, CDX2, cytokeratin 20, estrogen receptor, GATA-3, GCDFP, Glypican 3, Hep Par 1, Napsin A, and TTF-1. This immunohistochemical is nonspecific. Possibly primary sources include pancreatobiliary and upper gastrointestinal. Radiologic correlation is necessary. Of note, organ specific markers (GATA-3, GCDFP-breast, TTF-1, Napsin A-lung, and CDX2-colon) are negative. (JBK:ecj 08/09/2017)      08/21/2017 -  Chemotherapy    FOLFIRINOX every 2 weeks starting 08/21/17        10/14/2017 Imaging    CT CAP WO Contrast 10/14/17 IMPRESSION: Evidence of known numerous liver metastases without significant interval change. No other evidence of metastatic disease within the chest, abdomen or pelvis. Cholelithiasis. Tiny pericardial effusion.      12/02/2017 Genetic Testing    Negative for pathogenic mutation.  The genes analyzed were the 83 genes on [facility]'s Multi-Cancer panel (ALK, APC, ATM, AXIN2, BAP1, BARD1, BLM, BMPR1A, BRCA1, BRCA2, BRIP1, CASR, CDC73, CDH1, CDK4, CDKN1B, CDKN1C, CDKN2A, CEBPA, CHEK2, CTNNA1, DICER1, DIS3L2, EGFR, EPCAM, FH, FLCN, GATA2, GPC3, GREM1, HOXB13, HRAS, KIT, MAX, MEN1, MET, MITF, MLH1, MSH2, MSH3, MSH6, MUTYH, NBN, NF1, NF2, NTHL1, PALB2, PDGFRA, PHOX2B, PMS2, POLD1, POLE, POT1, PRKAR1A, PTCH1, PTEN, RAD50, RAD51C, RAD51D, RB1, RECQL4, RET, RUNX1, SDHA, SDHAF2, SDHB, SDHC, SDHD, SMAD4, SMARCA4, SMARCB1, SMARCE1, STK11, SUFU, TERC, TERT, TMEM127, TP53, TSC1, TSC2, VHL, WRN, WT1).      12/17/2017 PET scan  IMPRESSION: 1. Although the liver metastases are still visible on the CT  images, they are significantly smaller and retain no abnormal metabolic activity, consistent with response to therapy. 2. No abnormal activity within the pancreas. 3. No disease progression identified. 4. Cholelithiasis.     CURRENT THERAPY: mFOLFIRINOX every 2 weeks started 08/21/17   INTERVAL HISTORY: Ms. Hodgkiss returns for follow up and cycle 10 FOLFIRINOX as scheduled. She had diarrhea x3 days last week, which is unusual for her to have diarrhea on her week off chemo. Improved with imodium but stool continues to be loose. No blood in stool. She has vomiting with brushing her teeth if she brushes too quickly after meals, which occurred twice since last cycle. Occasionally takes anti-emetics before meals for nausea prophylaxis. Cold sensitivity improved slightly but did not resolve after last cycle, she presents wearing gloves today. No other numbness or tingling unaccompanied by cold sensitivity. Her appetite is decreased but continues to eat and drink. Weight is decreased. She developed mucositis which improved with mouth wash. Not limiting po intake.   REVIEW OF SYSTEMS:   Constitutional: Denies fevers, chills (+) weight loss Eyes: Denies blurriness of vision Ears, nose, mouth, throat, and face: Denies sore throat (+) mucositis  Respiratory: Denies cough, dyspnea or wheezes Cardiovascular: Denies palpitation, chest discomfort or lower extremity swelling Gastrointestinal:  Denies nausea, constipation, hematochezia, abdominal pain, heartburn or change in bowel habits (+) diarrhea x3 days improved with imodium (+) loose stool (+) emesis with brushing teeth if too soon after meal  Skin: Denies abnormal skin rashes Lymphatics: Denies new lymphadenopathy or easy bruising Neurological:Denies numbness, tingling or new weaknesses (+) prolonged cold sensitivity  Behavioral/Psych: Mood is stable, sleeps well; no new changes  All other systems were reviewed with the patient and are  negative.  MEDICAL HISTORY:  Past Medical History:  Diagnosis Date  . Genetic testing 12/02/2017   Multi-Cancer panel (83 genes) @ [facility] - No pathogenic mutations detected  . Meniere disease 2016  . Pancreatitis     SURGICAL HISTORY: Past Surgical History:  Procedure Laterality Date  . CERVICAL ABLATION    . ENDOMETRIAL ABLATION  2011  . IR FLUORO GUIDE PORT INSERTION RIGHT  08/12/2017  . IR US GUIDE VASC ACCESS RIGHT  08/12/2017  . [place]    I have reviewed the social history and family history with the patient and they are unchanged from previous note.  ALLERGIES:  has No Known Allergies.  MEDICATIONS:  Current Outpatient Medications  Medication Sig Dispense Refill  . lidocaine-prilocaine (EMLA) cream Apply to affected area once 30 g 3  . loratadine (CLARITIN) 10 MG tablet Take 10 mg by mouth daily as needed for allergies.    . magic mouthwash w/lidocaine SOLN Take 10 mLs by mouth 3 (three) times daily as needed for mouth pain. Swish and spit 10 ML by mouth 3 times daily as needed for mouth pain. 240 mL 0  . meclizine (ANTIVERT) 25 MG tablet Take 25 mg by mouth 3 (three) times daily as needed for dizziness.    . ondansetron (ZOFRAN-ODT) 8 MG disintegrating tablet Take 1 tablet (8 mg total) by mouth every 8 (eight) hours as needed for nausea or vomiting. 40 tablet 2  . potassium chloride (MICRO-K) 10 MEQ CR capsule   0  . prochlorperazine (COMPAZINE) 10 MG tablet Take 1 tablet (10 mg total) by mouth every 6 (six) hours as needed for nausea or vomiting. 40 tablet 2  . bisacodyl (DULCOLAX)  5 MG EC tablet Take 5 mg by mouth daily as needed for mild constipation or moderate constipation.    Marland Kitchen dronabinol (MARINOL) 2.5 MG capsule Take 1 capsule (2.5 mg total) by mouth 2 (two) times daily before a meal. 40 capsule 0  . polyethylene glycol (MIRALAX / GLYCOLAX) packet Take 17 g by mouth daily as needed for mild constipation.    . traMADol (ULTRAM) 50 MG tablet Take 1  tablet (50 mg total) by mouth every 6 (six) hours as needed. 30 tablet 0   No current facility-administered medications for this visit.    Facility-Administered Medications Ordered in Other Visits  Medication Dose Route Frequency Provider Last Rate Last Dose  . atropine injection 0.5 mg  0.5 mg Intravenous Once PRN [name], MD      . fluorouracil (ADRUCIL) 3,650 mg in sodium chloride 0.9 % 77 mL chemo infusion  2,200 mg/m2 (Treatment Plan Recorded) Intravenous 1 day or 1 dose [name], MD      . irinotecan (CAMPTOSAR) 220 mg in dextrose 5 % 500 mL chemo infusion  140 mg/m2 (Order-Specific) Intravenous Once [name], MD      . leucovorin 668 mg in dextrose 5 % 250 mL infusion  400 mg/m2 (Treatment Plan Recorded) Intravenous Once [name], MD        PHYSICAL EXAMINATION: ECOG PERFORMANCE STATUS: 1 - Symptomatic but completely ambulatory  Vitals:   01/03/18 0928  BP: (!) 137/93  Pulse: 94  Resp: 18  Temp: 98.3 F (36.8 C)  SpO2: 100%   Filed Weights   01/03/18 0928  Weight: 107 lb 4.8 oz (48.7 kg)    GENERAL:alert, no distress and comfortable SKIN: skin color, texture, turgor are normal, no rashes or significant lesions EYES: normal, Conjunctiva are pink and non-injected, sclera clear OROPHARYNX: oral lesion with surrounding erythema to left buccal mucosa  LYMPH:  no palpable cervical, supraclavicular, or axillary lymphadenopathy  LUNGS: clear to auscultation with normal breathing effort HEART: regular rate & rhythm and no murmurs and no lower extremity edema ABDOMEN:abdomen soft, non-tender and normal bowel sounds Musculoskeletal:no cyanosis of digits and no clubbing  NEURO: alert & oriented x 3 with fluent speech, no focal motor/sensory deficits PAC without erythema   LABORATORY DATA:  I have reviewed the data as listed CBC Latest Ref Rng & Units 01/03/2018 12/20/2017 12/06/2017  WBC 3.9 - 10.3 K/uL 15.5(H) 13.6(H) 9.9  Hemoglobin 11.6 - 15.9 g/dL 8.9(L) 8.9(L) 9.7(L)   Hematocrit 34.8 - 46.6 % 26.4(L) 26.8(L) 29.7(L)  Platelets 145 - 400 K/uL 120(L) 126(L) 110(L)     CMP Latest Ref Rng & Units 01/03/2018 12/20/2017 12/06/2017  Glucose 70 - 140 mg/dL 114 109 109  BUN 7 - 26 mg/dL _0 Creatinine 0.60 - 1.10 mg/dL 1.54(H) 1.52(H) 1.75(H)  Sodium 136 - 145 mmol/L 141 142 141  Potassium 3.5 - 5.1 mmol/L 2.9(LL) 3.0(LL) 3.2(L)  Chloride 98 - 109 mmol/L 105 106 104  CO2 22 - 29 mmol/L _1 Calcium 8.4 - 10.4 mg/dL 8.3(L) 9.0 9.6  Total Protein 6.4 - 8.3 g/dL 7.4 7.1 7.2  Total Bilirubin 0.2 - 1.2 mg/dL 0.7 0.5 0.5  Alkaline Phos 40 - 150 U/L 354(H) 303(H) 293(H)  AST 5 - 34 U/L 66(H) 70(H) 62(H)  ALT 0 - 55 U/L 49 46 44   PATHOLOGY  Liver Biopsy  Diagnosis 08/05/17 Liver, needle/core biopsy - CARCINOMA. - SEE COMMENT. Microscopic Comment The malignant cells are positive for cytokeratin  7. They are negative for arginase, CDX2, cytokeratin 20, estrogen receptor, GATA-3, GCDFP, Glypican 3, Hep Par 1, Napsin A, and TTF-1. This immunohistochemical is nonspecific. Possibly primary sources include pancreatobiliary and upper gastrointestinal. Radiologic correlation is necessary. Of note, organ specific markers (GATA-3, GCDFP-breast, TTF-1, Napsin A-lung, and CDX2-colon) are negative. (JBK:ecj 08/09/2017)    RADIOGRAPHIC STUDIES: I have personally reviewed the radiological images as listed and agreed with the findings in the report. No results found.   ASSESSMENT & PLAN: Morgan Dawson is a 57 y.o. caucasian female with a history of vertigo and menieres disease, presented with epigastric discomfort, low appetite and 5 pound weight loss   1. 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 Thole appears stable. She completed cycle 9 FOLFIRINOX on 12/20/17 with neulasta on day 3. She tolerated treatment moderately well with increased diarrhea, decreased appetite, mild mucositis, and prolonged cold sensitivity. Her symptoms  are overall controlled, she manages well. Continue magic mouthwash for mucositis.  -VSS, labs reviewed; leukocytosis likely secondary to neulasta. Labs adequate for treatment. Will continue FOLFIRINOX at current doses.  -F/u in 2 weeks for cycle 11   2. Pancreatitis, likely secondary to #1 -Resolved.   3. Nausea and diarrhea  -She experienced increased diarrhea with cycle 9, lasting 3 days and improved with imodium. Denies change in her diet or sick exposures. I recommend to alternate imodium with lomotil for better control.   4. Anorexia and weight loss -She continues to lose weight steadily. We discussed appetite stimulants and associated side effects, such as mirtazapine, megace, and Marinol. She agrees to try Marinol, I prescribed today to her pharmacy.  -F/u with dietician   5. Hypercalcemia, secondary to #1 -Resolved.   6. Transaminitis -Improved on chemotherapy; overall mild, stable. Continue monitoring    7. Goal of care discussion   8. Hypokalemia, CKD stage III -K 2.9 today with recent increased diarrhea and loose stool. She tried oral liquid potassium but could not tolerate. She takes 10 mEq TID. Will give additional 20 mEq IV today with chemo, I discussed with pharmacy.  -Cr. Is improved, 1.54 today; she continues to hydrate well. Continue monitoring  9. Meniere's Disease  -She has had a few mild flares recently, controlled with rest, repositioning, and meclizine PRN.   10. Genetics - negative   PLAN -Labs reviewed, OK to proceed with cycle 10 FOLFIRINOX at current doses -IV K 20 mEq today with chemo for hypokalemia, discussed with pharmacy -Continue oral K 10 mEq TID -alternate imodium and lomotil for diarrhea  -Magic mouthwash for mucositis  -Marinol for decreased appetite, weight loss; prescribed today -F/u in 2 weeks with cycle 11  All questions were answered. The patient knows to call the clinic with any problems, questions or concerns. No barriers to learning  was detected. I spent 20 minutes counseling the patient face to face. The total time spent in the appointment was 25 minutes and more than 50% was on counseling and review of test results     Alla Feeling, NP 01/03/18

## 2018-01-03 NOTE — Patient Instructions (Signed)
[facility] Discharge Instructions for Patients Receiving Chemotherapy  Today you received the following chemotherapy agents: Oxaliplatin, Irinotecan, Leucovorin, and 5FU.  To help prevent nausea and vomiting after your treatment, we encourage you to take your nausea medication as directed.  If you develop nausea and vomiting that is not controlled by your nausea medication, call the clinic.   BELOW ARE SYMPTOMS THAT SHOULD BE REPORTED IMMEDIATELY:  *FEVER GREATER THAN 100.5 F  *CHILLS WITH OR WITHOUT FEVER  NAUSEA AND VOMITING THAT IS NOT CONTROLLED WITH YOUR NAUSEA MEDICATION  *UNUSUAL SHORTNESS OF BREATH  *UNUSUAL BRUISING OR BLEEDING  TENDERNESS IN MOUTH AND THROAT WITH OR WITHOUT PRESENCE OF ULCERS  *URINARY PROBLEMS  *BOWEL PROBLEMS  UNUSUAL RASH Items with * indicate a potential emergency and should be followed up as soon as possible.  Feel free to call the clinic should you have any questions or concerns. The clinic phone number is [phone].  Please show the [place] at check-in to the Emergency Department and triage nurse.   Hypokalemia Hypokalemia means that the amount of potassium in the blood is lower than normal.Potassium is a chemical that helps regulate the amount of fluid in the body (electrolyte). It also stimulates muscle tightening (contraction) and helps nerves work properly.Normally, most of the body's potassium is inside of cells, and only a very small amount is in the blood. Because the amount in the blood is so small, minor changes to potassium levels in the blood can be life-threatening. What are the causes? This condition may be caused by:  Antibiotic medicine.  Diarrhea or vomiting. Taking too much of a medicine that helps you have a bowel movement (laxative) can cause diarrhea and lead to hypokalemia.  Chronic kidney disease (CKD).  Medicines that help the body get rid of excess fluid (diuretics).  Eating  disorders, such as bulimia.  Low magnesium levels in the body.  Sweating a lot.  What are the signs or symptoms? Symptoms of this condition include:  Weakness.  Constipation.  Fatigue.  Muscle cramps.  Mental confusion.  Skipped heartbeats or irregular heartbeat (palpitations).  Tingling or numbness.  How is this diagnosed? This condition is diagnosed with a blood test. How is this treated? Hypokalemia can be treated by taking potassium supplements by mouth or adjusting the medicines that you take. Treatment may also include eating more foods that contain a lot of potassium. If your potassium level is very low, you may need to get potassium through an IV tube in one of your veins and be monitored in the hospital. Follow these instructions at home:  Take over-the-counter and prescription medicines only as told by your health care provider. This includes vitamins and supplements.  Eat a healthy diet. A healthy diet includes fresh fruits and vegetables, whole grains, healthy fats, and lean proteins.  If instructed, eat more foods that contain a lot of potassium, such as: ? Nuts, such as peanuts and pistachios. ? Seeds, such as sunflower seeds and pumpkin seeds. ? Peas, lentils, and lima beans. ? Whole grain and bran cereals and breads. ? Fresh fruits and vegetables, such as apricots, avocado, bananas, cantaloupe, kiwi, oranges, tomatoes, asparagus, and potatoes. ? Orange juice. ? Tomato juice. ? Red meats. ? Yogurt.  Keep all follow-up visits as told by your health care provider. This is important. Contact a health care provider if:  You have weakness that gets worse.  You feel your heart pounding or racing.  You vomit.  You  have diarrhea.  You have diabetes (diabetes mellitus) and you have trouble keeping your blood sugar (glucose) in your target range. Get help right away if:  You have chest pain.  You have shortness of breath.  You have vomiting or  diarrhea that lasts for more than 2 days.  You faint. This information is not intended to replace advice given to you by your health care provider. Make sure you discuss any questions you have with your health care provider. Document Released: 08/10/2005 Document Revised: 03/28/2016 Document Reviewed: 03/28/2016 Elsevier Interactive Patient Education  2018 [employer].

## 2018-01-05 ENCOUNTER — Inpatient Hospital Stay: Payer: 59

## 2018-01-05 VITALS — BP 135/77 | HR 88 | Temp 98.1°F | Resp 18

## 2018-01-05 DIAGNOSIS — Z7189 Other specified counseling: Secondary | ICD-10-CM

## 2018-01-05 DIAGNOSIS — C787 Secondary malignant neoplasm of liver and intrahepatic bile duct: Principal | ICD-10-CM

## 2018-01-05 DIAGNOSIS — Z5111 Encounter for antineoplastic chemotherapy: Secondary | ICD-10-CM | POA: Diagnosis not present

## 2018-01-05 DIAGNOSIS — C259 Malignant neoplasm of pancreas, unspecified: Secondary | ICD-10-CM

## 2018-01-05 MED ORDER — PEGFILGRASTIM INJECTION 6 MG/0.6ML ~~LOC~~
6.0000 mg | PREFILLED_SYRINGE | Freq: Once | SUBCUTANEOUS | Status: AC
  Administered 2018-01-05: 6 mg via SUBCUTANEOUS

## 2018-01-05 MED ORDER — SODIUM CHLORIDE 0.9% FLUSH
10.0000 mL | INTRAVENOUS | Status: DC | PRN
  Administered 2018-01-05: 10 mL
  Filled 2018-01-05: qty 10

## 2018-01-05 MED ORDER — PEGFILGRASTIM INJECTION 6 MG/0.6ML ~~LOC~~
PREFILLED_SYRINGE | SUBCUTANEOUS | Status: AC
  Filled 2018-01-05: qty 0.6

## 2018-01-05 MED ORDER — HEPARIN SOD (PORK) LOCK FLUSH 100 UNIT/ML IV SOLN
500.0000 [IU] | Freq: Once | INTRAVENOUS | Status: AC | PRN
  Administered 2018-01-05: 500 [IU]
  Filled 2018-01-05: qty 5

## 2018-01-05 NOTE — Progress Notes (Signed)
Prior auth for dronabinol has been submitted. Status is pending.

## 2018-01-07 ENCOUNTER — Other Ambulatory Visit: Payer: Self-pay | Admitting: Hematology

## 2018-01-07 ENCOUNTER — Other Ambulatory Visit: Payer: Self-pay | Admitting: Nurse Practitioner

## 2018-01-07 DIAGNOSIS — R630 Anorexia: Secondary | ICD-10-CM

## 2018-01-07 DIAGNOSIS — C259 Malignant neoplasm of pancreas, unspecified: Secondary | ICD-10-CM

## 2018-01-07 DIAGNOSIS — C787 Secondary malignant neoplasm of liver and intrahepatic bile duct: Secondary | ICD-10-CM

## 2018-01-07 MED ORDER — DRONABINOL 2.5 MG PO CAPS: 2.5000 mg | ORAL_CAPSULE | Freq: Two times a day (BID) | ORAL | 0 refills | Status: DC

## 2018-01-07 NOTE — Progress Notes (Signed)
I spoke with [employer] about marinol denial. I corrected the diagnosis for the medication to include decreased appetite, weight loss. Case was reopened. Will await decision for approval/denial.

## 2018-01-12 ENCOUNTER — Inpatient Hospital Stay (HOSPITAL_BASED_OUTPATIENT_CLINIC_OR_DEPARTMENT_OTHER): Payer: 59 | Admitting: Medical

## 2018-01-12 ENCOUNTER — Telehealth: Payer: Self-pay

## 2018-01-12 ENCOUNTER — Inpatient Hospital Stay (HOSPITAL_COMMUNITY)
Admission: AD | Admit: 2018-01-12 | Discharge: 2018-01-15 | DRG: 391 | Disposition: A | Discharge status: Home / Self Care | Payer: 59 | Source: Ambulatory Visit | Attending: Internal Medicine | Admitting: Internal Medicine

## 2018-01-12 ENCOUNTER — Inpatient Hospital Stay: Payer: 59 | Admitting: Lab

## 2018-01-12 VITALS — BP 108/72 | HR 106 | Temp 100.2°F | Resp 18 | Ht 65.0 in | Wt 104.2 lb

## 2018-01-12 DIAGNOSIS — E860 Dehydration: Secondary | ICD-10-CM | POA: Diagnosis not present

## 2018-01-12 DIAGNOSIS — Z95828 Presence of other vascular implants and grafts: Secondary | ICD-10-CM

## 2018-01-12 DIAGNOSIS — D709 Neutropenia, unspecified: Secondary | ICD-10-CM | POA: Diagnosis present

## 2018-01-12 DIAGNOSIS — A084 Viral intestinal infection, unspecified: Secondary | ICD-10-CM | POA: Diagnosis not present

## 2018-01-12 DIAGNOSIS — D61810 Antineoplastic chemotherapy induced pancytopenia: Secondary | ICD-10-CM | POA: Diagnosis not present

## 2018-01-12 DIAGNOSIS — K529 Noninfective gastroenteritis and colitis, unspecified: Secondary | ICD-10-CM | POA: Diagnosis present

## 2018-01-12 DIAGNOSIS — R05 Cough: Secondary | ICD-10-CM | POA: Diagnosis not present

## 2018-01-12 DIAGNOSIS — Z808 Family history of malignant neoplasm of other organs or systems: Secondary | ICD-10-CM

## 2018-01-12 DIAGNOSIS — D649 Anemia, unspecified: Secondary | ICD-10-CM

## 2018-01-12 DIAGNOSIS — R197 Diarrhea, unspecified: Secondary | ICD-10-CM | POA: Diagnosis present

## 2018-01-12 DIAGNOSIS — Z800 Family history of malignant neoplasm of digestive organs: Secondary | ICD-10-CM | POA: Diagnosis not present

## 2018-01-12 DIAGNOSIS — C787 Secondary malignant neoplasm of liver and intrahepatic bile duct: Secondary | ICD-10-CM | POA: Diagnosis not present

## 2018-01-12 DIAGNOSIS — D6481 Anemia due to antineoplastic chemotherapy: Secondary | ICD-10-CM | POA: Diagnosis present

## 2018-01-12 DIAGNOSIS — R059 Cough, unspecified: Secondary | ICD-10-CM

## 2018-01-12 DIAGNOSIS — T451X5A Adverse effect of antineoplastic and immunosuppressive drugs, initial encounter: Secondary | ICD-10-CM | POA: Diagnosis present

## 2018-01-12 DIAGNOSIS — D701 Agranulocytosis secondary to cancer chemotherapy: Secondary | ICD-10-CM

## 2018-01-12 DIAGNOSIS — R0989 Other specified symptoms and signs involving the circulatory and respiratory systems: Secondary | ICD-10-CM

## 2018-01-12 DIAGNOSIS — E876 Hypokalemia: Secondary | ICD-10-CM | POA: Diagnosis present

## 2018-01-12 DIAGNOSIS — R634 Abnormal weight loss: Secondary | ICD-10-CM | POA: Diagnosis not present

## 2018-01-12 DIAGNOSIS — Z807 Family history of other malignant neoplasms of lymphoid, hematopoietic and related tissues: Secondary | ICD-10-CM | POA: Diagnosis not present

## 2018-01-12 DIAGNOSIS — Z803 Family history of malignant neoplasm of breast: Secondary | ICD-10-CM

## 2018-01-12 DIAGNOSIS — R630 Anorexia: Secondary | ICD-10-CM | POA: Diagnosis not present

## 2018-01-12 DIAGNOSIS — R509 Fever, unspecified: Secondary | ICD-10-CM

## 2018-01-12 DIAGNOSIS — R5081 Fever presenting with conditions classified elsewhere: Secondary | ICD-10-CM | POA: Diagnosis not present

## 2018-01-12 DIAGNOSIS — C259 Malignant neoplasm of pancreas, unspecified: Secondary | ICD-10-CM | POA: Diagnosis not present

## 2018-01-12 DIAGNOSIS — N183 Chronic kidney disease, stage 3 (moderate): Secondary | ICD-10-CM | POA: Diagnosis not present

## 2018-01-12 DIAGNOSIS — X58XXXA Exposure to other specified factors, initial encounter: Secondary | ICD-10-CM | POA: Diagnosis present

## 2018-01-12 DIAGNOSIS — Z79899 Other long term (current) drug therapy: Secondary | ICD-10-CM

## 2018-01-12 DIAGNOSIS — E875 Hyperkalemia: Secondary | ICD-10-CM | POA: Diagnosis not present

## 2018-01-12 DIAGNOSIS — Z681 Body mass index (BMI) 19 or less, adult: Secondary | ICD-10-CM | POA: Diagnosis not present

## 2018-01-12 DIAGNOSIS — R112 Nausea with vomiting, unspecified: Secondary | ICD-10-CM | POA: Diagnosis not present

## 2018-01-12 DIAGNOSIS — Z79891 Long term (current) use of opiate analgesic: Secondary | ICD-10-CM

## 2018-01-12 DIAGNOSIS — D696 Thrombocytopenia, unspecified: Secondary | ICD-10-CM | POA: Diagnosis present

## 2018-01-12 DIAGNOSIS — R110 Nausea: Secondary | ICD-10-CM | POA: Diagnosis not present

## 2018-01-12 HISTORY — DX: Noninfective gastroenteritis and colitis, unspecified: K52.9

## 2018-01-12 HISTORY — DX: Diarrhea, unspecified: R19.7

## 2018-01-12 LAB — CMP (CANCER CENTER ONLY)
ALK PHOS: 290 U/L — AB (ref 40–150)
ALT: 44 U/L (ref 0–55)
ANION GAP: 10 (ref 3–11)
AST: 49 U/L — ABNORMAL HIGH (ref 5–34)
Albumin: 3.2 g/dL — ABNORMAL LOW (ref 3.5–5.0)
BILIRUBIN TOTAL: 1.1 mg/dL (ref 0.2–1.2)
BUN: 21 mg/dL (ref 7–26)
CALCIUM: 8.1 mg/dL — AB (ref 8.4–10.4)
CO2: 25 mmol/L (ref 22–29)
Chloride: 99 mmol/L (ref 98–109)
Creatinine: 1.61 mg/dL — ABNORMAL HIGH (ref 0.60–1.10)
GFR, EST NON AFRICAN AMERICAN: 35 mL/min — AB (ref >60)
GFR, Est AFR Am: 41 mL/min — ABNORMAL LOW (ref >60)
GLUCOSE: 116 mg/dL (ref 70–140)
POTASSIUM: 2.9 mmol/L — AB (ref 3.5–5.1)
Sodium: 134 mmol/L — ABNORMAL LOW (ref 136–145)
TOTAL PROTEIN: 6.8 g/dL (ref 6.4–8.3)

## 2018-01-12 LAB — URINALYSIS, COMPLETE (UACMP) WITH MICROSCOPIC
Bacteria, UA: NONE SEEN
Bilirubin Urine: NEGATIVE
GLUCOSE, UA: NEGATIVE mg/dL
Hgb urine dipstick: NEGATIVE
Ketones, ur: 5 mg/dL — AB
Leukocytes, UA: NEGATIVE
Nitrite: NEGATIVE
PH: 5 (ref 5.0–8.0)
PROTEIN: 30 mg/dL — AB
Specific Gravity, Urine: 1.016 (ref 1.005–1.030)

## 2018-01-12 LAB — CBC WITH DIFFERENTIAL (CANCER CENTER ONLY)
Basophils Absolute: 0 10*3/uL (ref 0.0–0.1)
Basophils Relative: 1 %
EOS PCT: 0 %
Eosinophils Absolute: 0 10*3/uL (ref 0.0–0.5)
HEMATOCRIT: 19.3 % — AB (ref 34.8–46.6)
Hemoglobin: 6.6 g/dL — CL (ref 11.6–15.9)
LYMPHS PCT: 27 %
Lymphs Abs: 0.3 10*3/uL — ABNORMAL LOW (ref 0.9–3.3)
MCH: 35.1 pg — AB (ref 25.1–34.0)
MCHC: 34.4 g/dL (ref 31.5–36.0)
MCV: 102 fL — AB (ref 79.5–101.0)
MONO ABS: 0.4 10*3/uL (ref 0.1–0.9)
MONOS PCT: 44 %
NEUTROS ABS: 0.3 10*3/uL — AB (ref 1.5–6.5)
Neutrophils Relative %: 28 %
PLATELETS: 31 10*3/uL — AB (ref 145–400)
RBC: 1.89 MIL/uL — ABNORMAL LOW (ref 3.70–5.45)
RDW: 16.2 % — AB (ref 11.2–14.5)
WBC Count: 0.9 10*3/uL — CL (ref 3.9–10.3)

## 2018-01-12 LAB — ABO/RH: ABO/RH(D): A POS

## 2018-01-12 LAB — MAGNESIUM: Magnesium: 0.8 mg/dL — CL (ref 1.7–2.4)

## 2018-01-12 LAB — PREPARE RBC (CROSSMATCH)

## 2018-01-12 MED ORDER — SODIUM CHLORIDE 0.9 % IV SOLN
Freq: Once | INTRAVENOUS | Status: AC
  Administered 2018-01-12: 15:00:00 via INTRAVENOUS
  Filled 2018-01-12: qty 1000

## 2018-01-12 MED ORDER — LORAZEPAM 2 MG/ML IJ SOLN
0.5000 mg | Freq: Once | INTRAMUSCULAR | Status: AC
  Administered 2018-01-12: 0.5 mg via INTRAVENOUS

## 2018-01-12 MED ORDER — ONDANSETRON HCL 4 MG PO TABS: 4.0000 mg | ORAL_TABLET | Freq: Four times a day (QID) | ORAL | Status: DC | PRN

## 2018-01-12 MED ORDER — SODIUM CHLORIDE 0.9 % IV SOLN: Freq: Once | INTRAVENOUS | Status: DC

## 2018-01-12 MED ORDER — LORAZEPAM 2 MG/ML IJ SOLN
INTRAMUSCULAR | Status: AC
  Filled 2018-01-12: qty 1

## 2018-01-12 MED ORDER — CEFEPIME HCL 1 G IV SOLR: 2.0000 g | Freq: Once | INTRAVENOUS | Status: DC

## 2018-01-12 MED ORDER — SODIUM CHLORIDE 0.9 % IV SOLN: 2.0000 g | Freq: Two times a day (BID) | INTRAVENOUS | Status: DC

## 2018-01-12 MED ORDER — ONDANSETRON HCL 4 MG/2ML IJ SOLN
4.0000 mg | Freq: Four times a day (QID) | INTRAMUSCULAR | Status: DC | PRN
  Administered 2018-01-13: 4 mg via INTRAVENOUS
  Filled 2018-01-12: qty 2

## 2018-01-12 MED ORDER — MECLIZINE HCL 25 MG PO TABS
25.0000 mg | ORAL_TABLET | Freq: Three times a day (TID) | ORAL | Status: DC | PRN
Start: 2018-01-12 — End: 2018-01-15

## 2018-01-12 MED ORDER — SODIUM CHLORIDE 0.9 % IV SOLN
2.0000 g | Freq: Once | INTRAVENOUS | Status: AC
  Administered 2018-01-12: 2 g via INTRAVENOUS
  Filled 2018-01-12: qty 2

## 2018-01-12 MED ORDER — ATROPINE SULFATE 1 MG/ML IJ SOLN
INTRAMUSCULAR | Status: AC
  Filled 2018-01-12: qty 1

## 2018-01-12 MED ORDER — ACETAMINOPHEN 650 MG RE SUPP: 650.0000 mg | Freq: Four times a day (QID) | RECTAL | Status: DC | PRN

## 2018-01-12 MED ORDER — SODIUM CHLORIDE 0.9 % IV SOLN
Freq: Once | INTRAVENOUS | Status: AC
  Administered 2018-01-12: 20:00:00 via INTRAVENOUS

## 2018-01-12 MED ORDER — POTASSIUM CHLORIDE IN NACL 20-0.45 MEQ/L-% IV SOLN
INTRAVENOUS | Status: DC
  Administered 2018-01-13 – 2018-01-14 (×4): via INTRAVENOUS
  Filled 2018-01-12 (×6): qty 1000

## 2018-01-12 MED ORDER — VANCOMYCIN HCL IN DEXTROSE 1-5 GM/200ML-% IV SOLN
1000.0000 mg | Freq: Once | INTRAVENOUS | Status: AC
  Administered 2018-01-13: 1000 mg via INTRAVENOUS
  Filled 2018-01-12: qty 200

## 2018-01-12 MED ORDER — SODIUM CHLORIDE 0.9 % IV SOLN
2.0000 g | INTRAVENOUS | Status: DC
  Administered 2018-01-13 – 2018-01-14 (×3): 2 g via INTRAVENOUS
  Filled 2018-01-12 (×4): qty 2

## 2018-01-12 MED ORDER — SODIUM CHLORIDE 0.9 % IV SOLN
1000.0000 mL | Freq: Once | INTRAVENOUS | Status: AC
  Administered 2018-01-12: 1000 mL via INTRAVENOUS

## 2018-01-12 MED ORDER — SODIUM CHLORIDE 0.9% FLUSH
3.0000 mL | Freq: Two times a day (BID) | INTRAVENOUS | Status: DC
  Administered 2018-01-12 – 2018-01-15 (×3): 3 mL via INTRAVENOUS

## 2018-01-12 MED ORDER — ACETAMINOPHEN 325 MG PO TABS: 650.0000 mg | ORAL_TABLET | Freq: Four times a day (QID) | ORAL | Status: DC | PRN

## 2018-01-12 MED ORDER — ATROPINE SULFATE 1 MG/ML IJ SOLN
0.4000 mg | Freq: Once | INTRAMUSCULAR | Status: AC
  Administered 2018-01-12: 0.4 mg via INTRAVENOUS

## 2018-01-12 MED ORDER — VANCOMYCIN HCL IN DEXTROSE 750-5 MG/150ML-% IV SOLN: 750.0000 mg | INTRAVENOUS | Status: DC

## 2018-01-12 MED ORDER — TRAMADOL HCL 50 MG PO TABS: 50.0000 mg | ORAL_TABLET | Freq: Four times a day (QID) | ORAL | Status: DC | PRN

## 2018-01-12 MED ORDER — DRONABINOL 2.5 MG PO CAPS
2.5000 mg | ORAL_CAPSULE | Freq: Two times a day (BID) | ORAL | Status: DC
  Administered 2018-01-13: 2.5 mg via ORAL
  Filled 2018-01-12 (×3): qty 1

## 2018-01-12 NOTE — Progress Notes (Signed)
[facility] Progress Note   Morgan Dawson [id number] 1961-03-07 57 y.o.  Morgan Dawson is managed by Dr. [name]  Actively treated with chemotherapy: yes  Current Therapy: FOLFIRINOX  Last Treated: 01/03/2018 (cycle 10, day 1)  Assessment: Plan:    Non-intractable vomiting with nausea, unspecified vomiting type - Plan: CMP ([place] only), CBC with Differential ([place] Only), Urinalysis, Complete w Microscopic, LORazepam (ATIVAN) injection 0.5 mg, ceFEPIme (MAXIPIME) 2 g in sodium chloride 0.9 % 100 mL IVPB, DISCONTINUED: ceFEPIme (MAXIPIME) injection 2 g  Dehydration - Plan: 0.9 %  sodium chloride infusion, ceFEPIme (MAXIPIME) 2 g in sodium chloride 0.9 % 100 mL IVPB, sodium chloride 0.9 % 1,000 mL with potassium chloride 40 mEq, magnesium sulfate 4 g infusion, DISCONTINUED: ceFEPIme (MAXIPIME) injection 2 g, DISCONTINUED: sodium chloride 0.9 % 1,000 mL with potassium chloride 40 mEq infusion  Diarrhea, unspecified type - Plan: Magnesium, Culture, Stool, C difficile quick screen w PCR reflex, atropine injection 0.4 mg, ceFEPIme (MAXIPIME) 2 g in sodium chloride 0.9 % 100 mL IVPB  Fever, unspecified fever cause - Plan: Culture, Blood, Culture, Urine, Culture, Blood, ceFEPIme (MAXIPIME) 2 g in sodium chloride 0.9 % 100 mL IVPB, DG Chest 2 View, DISCONTINUED: ceFEPIme (MAXIPIME) 2 g in sodium chloride 0.9 % 100 mL IVPB, DISCONTINUED: ceFEPIme (MAXIPIME) injection 2 g  Anemia, unspecified type - Plan: Practitioner attestation of consent, Complete patient signature process for consent form, Care order/instruction, heparin lock flush 100 unit/mL, Type and screen, acetaminophen (TYLENOL) tablet 650 mg, diphenhydrAMINE (BENADRYL) capsule 25 mg, Transfuse RBC, CANCELED: Prepare RBC  Chemotherapy-induced neutropenia (HCC) - Plan: ceFEPIme (MAXIPIME) 2 g in sodium chloride 0.9 % 100 mL IVPB, DG Chest 2 View, DISCONTINUED: ceFEPIme (MAXIPIME) 2 g in sodium chloride  0.9 % 100 mL IVPB  Hypomagnesemia - Plan: sodium chloride 0.9 % 1,000 mL with potassium chloride 40 mEq, magnesium sulfate 4 g infusion, DISCONTINUED: sodium chloride 0.9 % 1,000 mL with potassium chloride 40 mEq infusion  Hypokalemia - Plan: sodium chloride 0.9 % 1,000 mL with potassium chloride 40 mEq, magnesium sulfate 4 g infusion, DISCONTINUED: sodium chloride 0.9 % 1,000 mL with potassium chloride 40 mEq infusion   Non-intractable nausea and vomiting: A CBC and chemistry panel were collected today.  The patient was given Ativan 0.5 mg IV x1.  Dehydration: The patient was given normal saline IV with 2 L given today.  Diarrhea: A stool culture and C. difficile screen were ordered.  The patient was given atropine 0.4 mg IV x1.  Neutropenic fever: The patient was found to have a temperature of 100.2 today.  A CBC was completed and returned with a WBC of 0.9 and an ANC of 0.3.  The patient was given cefepime 2 g IV x1 and was admitted directly to [facility] (Room 1335) for management.  Blood cultures x2 were collected.  A urinalysis, urine culture, and chest x-ray were ordered.  Anemia: Patient CBC returned with a hemoglobin of 6.6 and hematocrit of 19.3.  A type and cross for 2 units of packed red blood cells was ordered.  Plans are to transfuse the patient later tonight after her current IV is completed.  Hypomagnesemia patient's magnesium level returned low at 0.8.  4 g of magnesium sulfate 4 g IV x1 was ordered.  Hypokalemia: A chemistry panel returned with a potassium of 2.9.   Potassium chloride 40 mEq IV and 1 L of normal saline was ordered.  Please see After Visit  Summary for patient specific instructions.  Future Appointments  Date Time Provider [place]  01/18/2018  9:45 AM [facility] LAB 4 [facility] None  01/18/2018 10:00 AM [facility] INJ NURSE [facility] None  01/18/2018 10:30 AM [name], MD [facility] None  01/18/2018 11:30 AM [facility] E17  [facility] None  01/31/2018 11:00 AM [facility] LAB 6 [facility] None  01/31/2018 11:15 AM [facility] FLUSH NURSE [facility] None  01/31/2018 11:30 AM Alla Feeling, NP [facility] None  01/31/2018 12:30 PM [facility] C10 [facility] None  01/31/2018  1:45 PM Cordella Register, Valda Lamb, RD [facility] None    Orders Placed This Encounter  Procedures  . Culture, Stool  . C difficile quick screen w PCR reflex  . Culture, Blood  . Culture, Urine  . Culture, Blood  . DG Chest 2 View  . CMP ([place] only)  . Magnesium  . CBC with Differential ([place] Only)  . Urinalysis, Complete w Microscopic  . Type and screen  . ABO/Rh       Subjective:   Patient ID:  Morgan Dawson is a 57 y.o. (DOB [birth year]) female.  Chief Complaint:  Chief Complaint  Patient presents with  . Diarrhea  . Dehydration  . Nausea  . Fever  . Emesis    HPI Morgan Dawson is a 57 year old female with a history of metastatic pancreatic cancer who is managed by Dr. [name] and is status post cycle 10, day 1 of FOLFIRINOX which was dosed on 01/03/2018 with PEG filgrastim given on 01/05/2018.  The patient reports to the clinic today with a report that she has had explosive, watery diarrhea since Monday evening.  She has taken no more than 3 doses of Imodium each day.  She is also had vomiting despite her use of Zofran.  Her p.o. intake has been decreased.  She reports that everything that she has eaten goes straight through her.  She denies fevers, chills, or sweats.  She denies pain and has no neuropathy.  Medications: I have reviewed the patient's current medications.  Allergies: No Known Allergies  Past Medical History:  Diagnosis Date  . Genetic testing 12/02/2017   Multi-Cancer panel (83 genes) @ [facility] - No pathogenic mutations detected  . Meniere disease 2016  . Pancreatitis     Past Surgical History:  Procedure Laterality Date  . CERVICAL ABLATION    . ENDOMETRIAL ABLATION  2011  .  IR FLUORO GUIDE PORT INSERTION RIGHT  08/12/2017  . IR US GUIDE VASC ACCESS RIGHT  08/12/2017  . MANDIBLE SURGERY  1999    Family History  Problem Relation Age of Onset  . Colon cancer Father 55       currently 42  . Colon cancer Maternal Grandfather   . Breast cancer Cousin   . Thyroid cancer Mother 38       facial radiation for acne as teen  . Lymphoma Paternal Aunt 29       deceased 68  . Breast cancer Paternal Aunt     Social History   Socioeconomic History  . Marital status: Married    Spouse name: Not on file  . Number of children: Not on file  . Years of education: Not on file  . Highest education level: Not on file  Occupational History  . Not on file  Social Needs  . Financial resource strain: Not on file  . Food insecurity:    Worry: Not on file    Inability: Not on file  .  Transportation needs:    Medical: Not on file    Non-medical: Not on file  Tobacco Use  . Smoking status: Never Smoker  . Smokeless tobacco: Never Used  Substance and Sexual Activity  . Alcohol use: No    Frequency: Never  . Drug use: No  . Sexual activity: Not on file  Lifestyle  . Physical activity:    Days per week: Not on file    Minutes per session: Not on file  . Stress: Not on file  Relationships  . Social connections:    Talks on phone: Not on file    Gets together: Not on file    Attends religious service: Not on file    Active member of club or organization: Not on file    Attends meetings of clubs or organizations: Not on file    Relationship status: Not on file  . Intimate partner violence:    Fear of current or ex partner: Not on file    Emotionally abused: Not on file    Physically abused: Not on file    Forced sexual activity: Not on file  Other Topics Concern  . Not on file  Social History Narrative  . Not on file    Past Medical History, Surgical history, Social history, and Family history were reviewed and updated as appropriate.   Please see review  of systems for further details on the patient's review from today.   Review of Systems:  Review of Systems  Constitutional: Positive for appetite change. Negative for chills, diaphoresis and fever.  Respiratory: Negative for cough, chest tightness and shortness of breath.   Cardiovascular: Negative for chest pain, palpitations and leg swelling.  Gastrointestinal: Positive for diarrhea, nausea and vomiting. Negative for abdominal distention, abdominal pain, blood in stool and constipation.  Genitourinary: Negative for decreased urine volume and difficulty urinating.  Neurological: Negative for weakness.    Objective:   Physical Exam:  BP 108/72 (BP Location: Left Arm, Patient Position: Sitting)   Pulse (!) 106 Comment: told rn  Temp 100.2 F (37.9 C) (Oral)   Resp 18   Ht 5\' 5"  (1.651 m)   Wt 104 lb 3.2 oz (47.3 kg)   LMP 10/23/2015   SpO2 100%   BMI 17.34 kg/m  ECOG: 1  Physical Exam  Constitutional: No distress.  HENT:  Head: Normocephalic and atraumatic.  Mouth/Throat: No oropharyngeal exudate.  Eyes: Right eye exhibits no discharge. Left eye exhibits no discharge. No scleral icterus.  Conjunctiva is pale bilaterally.  Neck: Normal range of motion. Neck supple.  Cardiovascular: Normal rate, regular rhythm and normal heart sounds. Exam reveals no gallop and no friction rub.  No murmur heard. Pulmonary/Chest: Effort normal and breath sounds normal. No respiratory distress. She has no wheezes. She has no rales.  Abdominal: Soft. Bowel sounds are normal. She exhibits no distension and no mass. There is no tenderness. There is no rebound and no guarding.  Lymphadenopathy:    She has no cervical adenopathy.  Neurological: She is alert. Coordination normal.  Skin: Skin is warm and dry. No rash noted. She is not diaphoretic. No erythema. There is pallor.    Lab Review:     Component Value Date/Time   NA 134 (L) 01/12/2018 1130   NA 134 (L) 08/23/2017 0951   K 2.9 (LL)  01/12/2018 1130   K 2.5 Repeated and Verified (LL) 08/23/2017 0951   CL 99 01/12/2018 1130   CO2 25 01/12/2018 1130  CO2 26 08/23/2017 0951   GLUCOSE 116 01/12/2018 1130   GLUCOSE 116 08/23/2017 0951   BUN 21 01/12/2018 1130   BUN 11.6 08/23/2017 0951   CREATININE 1.61 (H) 01/12/2018 1130   CREATININE 1.0 08/23/2017 0951   CALCIUM 8.1 (L) 01/12/2018 1130   CALCIUM 10.4 08/23/2017 0951   PROT 6.8 01/12/2018 1130   PROT 6.4 08/23/2017 0951   ALBUMIN 3.2 (L) 01/12/2018 1130   ALBUMIN 2.9 (L) 08/23/2017 0951   AST 49 (H) 01/12/2018 1130   AST 208 (HH) 08/23/2017 0951   ALT 44 01/12/2018 1130   ALT 151 (H) 08/23/2017 0951   ALKPHOS 290 (H) 01/12/2018 1130   ALKPHOS 549 (H) 08/23/2017 0951   BILITOT 1.1 01/12/2018 1130   BILITOT 0.82 08/23/2017 0951   GFRNONAA 35 (L) 01/12/2018 1130   GFRAA 41 (L) 01/12/2018 1130       Component Value Date/Time   WBC 0.9 (LL) 01/12/2018 1130   WBC 15.5 (H) 01/03/2018 0853   RBC 1.89 (L) 01/12/2018 1130   HGB 6.6 (LL) 01/12/2018 1130   HGB 11.9 08/23/2017 0951   HCT 19.3 (L) 01/12/2018 1130   HCT 36.0 08/23/2017 0951   PLT 31 (L) 01/12/2018 1130   PLT 401 (H) 08/23/2017 0951   MCV 102.0 (H) 01/12/2018 1130   MCV 92.8 08/23/2017 0951   MCH 35.1 (H) 01/12/2018 1130   MCHC 34.4 01/12/2018 1130   RDW 16.2 (H) 01/12/2018 1130   RDW 14.2 08/23/2017 0951   LYMPHSABS 0.3 (L) 01/12/2018 1130   LYMPHSABS 0.8 (L) 08/23/2017 0951   MONOABS 0.4 01/12/2018 1130   MONOABS 1.3 (H) 08/23/2017 0951   EOSABS 0.0 01/12/2018 1130   EOSABS 0.1 08/23/2017 0951   BASOSABS 0.0 01/12/2018 1130   BASOSABS 0.0 08/23/2017 0951   -------------------------------  Imaging from last 24 hours (if applicable):  Radiology interpretation: Nm Pet Image Restag (ps) Skull Base To Thigh  Result Date: 12/17/2017 CLINICAL DATA:  Subsequent treatment strategy for metastatic pancreatic cancer diagnosed 07/23/2017. EXAM: NUCLEAR MEDICINE PET SKULL BASE TO THIGH TECHNIQUE:  5.64 mCi F-18 FDG was injected intravenously. Full-ring PET imaging was performed from the skull base to thigh after the radiotracer. CT data was obtained and used for attenuation correction and anatomic localization. Fasting blood glucose: 108 mg/dl COMPARISON:  PET-CT 07/29/2017. Chest, abdomen and pelvic CT 10/14/2017. FINDINGS: Mediastinal blood pool activity: SUV max 2.0 NECK: No hypermetabolic cervical lymph nodes are identified.There are no lesions of the pharyngeal mucosal space. Incidental CT findings: none CHEST: There are no hypermetabolic mediastinal, hilar or axillary lymph nodes. There is no hypermetabolic pulmonary activity. Incidental CT findings: Stable tiny calcified right upper lobe granuloma. There is mild atelectasis at both lung bases. Right IJ Port-A-Cath extends to the superior cavoatrial junction. ABDOMEN/PELVIS: Compared with the prior studies, the extensive hepatic metastatic disease has decreased in size. There is no residual hypermetabolic activity associated with any of the lesions. Index lesion in the left hepatic lobe measures 3.6 x 4.8 cm on image 104/4 (8.3 x 6.0 cm on prior PET-CT). No residual abnormal activity within the pancreas. The spleen kidneys appear unremarkable. There is no hypermetabolic nodal activity in the abdomen or pelvis. Incidental CT findings: Cholelithiasis. SKELETON: There is no hypermetabolic activity to suggest osseous metastatic disease. Generalized marrow activity attributed to interval therapy. Incidental CT findings: Chronic bilateral L5 pars defects. IMPRESSION: 1. Although the liver metastases are still visible on the CT images, they are significantly smaller and retain no abnormal metabolic  activity, consistent with response to therapy. 2. No abnormal activity within the pancreas. 3. No disease progression identified. 4. Cholelithiasis. Electronically Signed   By: Richardean Sale M.D.   On: 12/17/2017 14:16        This case was discussed with Dr.  Burr Medico. She expressed agreement with my management of this patient.

## 2018-01-12 NOTE — Patient Instructions (Signed)
Dehydration, Adult Dehydration is when there is not enough fluid or water in your body. This happens when you lose more fluids than you take in. Dehydration can range from mild to very bad. It should be treated right away to keep it from getting very bad. Symptoms of mild dehydration may include:  Thirst.  Dry lips.  Slightly dry mouth.  Dry, warm skin.  Dizziness. Symptoms of moderate dehydration may include:  Very dry mouth.  Muscle cramps.  Dark pee (urine). Pee may be the color of tea.  Your body making less pee.  Your eyes making fewer tears.  Heartbeat that is uneven or faster than normal (palpitations).  Headache.  Light-headedness, especially when you stand up from sitting.  Fainting (syncope). Symptoms of very bad dehydration may include:  Changes in skin, such as: ? Cold and clammy skin. ? Blotchy (mottled) or pale skin. ? Skin that does not quickly return to normal after being lightly pinched and let go (poor skin turgor).  Changes in body fluids, such as: ? Feeling very thirsty. ? Your eyes making fewer tears. ? Not sweating when body temperature is high, such as in hot weather. ? Your body making very little pee.  Changes in vital signs, such as: ? Weak pulse. ? Pulse that is more than 100 beats a minute when you are sitting still. ? Fast breathing. ? Low blood pressure.  Other changes, such as: ? Sunken eyes. ? Cold hands and feet. ? Confusion. ? Lack of energy (lethargy). ? Trouble waking up from sleep. ? Short-term weight loss. ? Unconsciousness. Follow these instructions at home:  If told by your doctor, drink an ORS: ? Make an ORS by using instructions on the package. ? Start by drinking small amounts, about  cup (120 mL) every 5-10 minutes. ? Slowly drink more until you have had the amount that your doctor said to have.  Drink enough clear fluid to keep your pee clear or pale yellow. If you were told to drink an ORS, finish the ORS  first, then start slowly drinking clear fluids. Drink fluids such as: ? Water. Do not drink only water by itself. Doing that can make the salt (sodium) level in your body get too low (hyponatremia). ? Ice chips. ? Fruit juice that you have added water to (diluted). ? Low-calorie sports drinks.  Avoid: ? Alcohol. ? Drinks that have a lot of sugar. These include high-calorie sports drinks, fruit juice that does not have water added, and soda. ? Caffeine. ? Foods that are greasy or have a lot of fat or sugar.  Take over-the-counter and prescription medicines only as told by your doctor.  Do not take salt tablets. Doing that can make the salt level in your body get too high (hypernatremia).  Eat foods that have minerals (electrolytes). Examples include bananas, oranges, potatoes, tomatoes, and spinach.  Keep all follow-up visits as told by your doctor. This is important. Contact a doctor if:  You have belly (abdominal) pain that: ? Gets worse. ? Stays in one area (localizes).  You have a rash.  You have a stiff neck.  You get angry or annoyed more easily than normal (irritability).  You are more sleepy than normal.  You have a harder time waking up than normal.  You feel: ? Weak. ? Dizzy. ? Very thirsty.  You have peed (urinated) only a small amount of very dark pee during 6-8 hours. Get help right away if:  You have symptoms of   very bad dehydration.  You cannot drink fluids without throwing up (vomiting).  Your symptoms get worse with treatment.  You have a fever.  You have a very bad headache.  You are throwing up or having watery poop (diarrhea) and it: ? Gets worse. ? Does not go away.  You have blood or something green (bile) in your throw-up.  You have blood in your poop (stool). This may cause poop to look black and tarry.  You have not peed in 6-8 hours.  You pass out (faint).  Your heart rate when you are sitting still is more than 100 beats a  minute.  You have trouble breathing. This information is not intended to replace advice given to you by your health care provider. Make sure you discuss any questions you have with your health care provider. Document Released: 06/06/2009 Document Revised: 02/28/2016 Document Reviewed: 10/04/2015 Elsevier Interactive Patient Education  2018 Elsevier Inc.  

## 2018-01-12 NOTE — Progress Notes (Signed)
Pharmacy Antibiotic Note  Morgan Dawson is a 57 y.o. female with metastatic pancreatic cancer currently on chemotherapy treatment, presented to the ED on 01/12/2018 with c/o diarrhea.  To start vancomycin and cefepime for febrile neutropenia.  - wbc 0.9, ANC 0.3, scr 1.61 (crcl~29)  Plan: - vancomycin 1000 mg IV x1, then 750 mg IV q48h for est AUC 427 - cefepime 2gm IV q24h - monitor renal function closely - daily scr daily x3 starting on 5/23   _____________________________________________   Temp (24hrs), Avg:100.2 F (37.9 C), Min:100.2 F (37.9 C), Max:100.2 F (37.9 C)  Recent Labs  Lab 01/12/18 1130  WBC 0.9*  CREATININE 1.61*    Estimated Creatinine Clearance: 29.5 mL/min (A) (by C-G formula based on SCr of 1.61 mg/dL (H)).    No Known Allergies  A Thank you for allowing pharmacy to be a part of this patient's care.  Lynelle Doctor 01/12/2018 6:38 PM

## 2018-01-12 NOTE — Telephone Encounter (Signed)
Pt called reporting "I'm not doing very well." She reports vomiting and diarrhea the last 36 hours. "I haven't had any nutrition in the last 36 hours." Patient sheduled to see Sandi Mealy PA in symptom management clinic at 10 AM. Patient aware to arrive by 9:30 to register.

## 2018-01-12 NOTE — H&P (Signed)
[facility] History and Physical   Patient: Morgan Dawson YIF:[id number]   PCP: [name], MD DOB: 06-15-1961   DOA: 01/12/2018   DOS: 01/12/2018   DOS: the patient was seen and examined on 01/12/2018 Patient coming from: The patient is coming from home.  Chief Complaint: diarrhea and vomiting  HPI: SUNNY AGUON is a 57 y.o. female with Past medical history of  Stage IV Pancreatic cancer with liver mets, chemotherapy with FOLFIRINOX regimen.  Last chemotherapy was on 01/20/2018, cycle 10-day 1.  Patient presented with complaints of nausea and vomiting ongoing since this Monday. Patient reported 4 episodes of vomiting yesterday and 5 episodes of diarrhea. No blood in stool and vomiting. No fever or chills. Also no abdominal pain. Pt was seen in the clinic today as her symptoms are not getting better. Last BM was this AM. No nausea at my evaluation.  Pt was referred for admission from clinic due to low grade fever and neutropenia.  At her baseline ambulates without support And is independent for most of her ADL; manages her medication on her own.  Review of Systems: as mentioned in the history of present illness.  All other systems reviewed and are negative.  Past Medical History:  Diagnosis Date  . Genetic testing 12/02/2017   Multi-Cancer panel (83 genes) @ [facility] - No pathogenic mutations detected  . Meniere disease 2016  . Pancreatitis    Past Surgical History:  Procedure Laterality Date  . CERVICAL ABLATION    . ENDOMETRIAL ABLATION  2011  . IR FLUORO GUIDE PORT INSERTION RIGHT  08/12/2017  . IR US GUIDE VASC ACCESS RIGHT  08/12/2017  . MANDIBLE SURGERY  1999   Social History:  reports that she has never smoked. She has never used smokeless tobacco. She reports that she does not drink alcohol or use drugs.  No Known Allergies  Family History  Problem Relation Age of Onset  . Colon cancer Father 35       currently 51  . Colon cancer Maternal Grandfather   .  Breast cancer Cousin   . Thyroid cancer Mother 59       facial radiation for acne as teen  . Lymphoma Paternal Aunt 51       deceased 60  . Breast cancer Paternal Aunt      Prior to Admission medications   Medication Sig Start Date End Date Taking? Authorizing Provider  dronabinol (MARINOL) 2.5 MG capsule Take 1 capsule (2.5 mg total) by mouth 2 (two) times daily before a meal. 01/07/18  Yes Alla Feeling, NP  lidocaine-prilocaine (EMLA) cream Apply to affected area once 08/11/17  Yes [name], MD  loratadine (CLARITIN) 10 MG tablet Take 10 mg by mouth daily as needed for allergies.   Yes [provider]  magic mouthwash w/lidocaine SOLN Take 10 mLs by mouth 3 (three) times daily as needed for mouth pain. Swish and spit 10 ML by mouth 3 times daily as needed for mouth pain. 08/31/17  Yes Alla Feeling, NP  meclizine (ANTIVERT) 25 MG tablet Take 25 mg by mouth 3 (three) times daily as needed for dizziness.   Yes [provider]  ondansetron (ZOFRAN-ODT) 8 MG disintegrating tablet Take 1 tablet (8 mg total) by mouth every 8 (eight) hours as needed for nausea or vomiting. 08/11/17  Yes [name], MD  prochlorperazine (COMPAZINE) 10 MG tablet Take 1 tablet (10 mg total) by mouth every 6 (six) hours as needed for  nausea or vomiting. 12/06/17  Yes Alla Feeling, NP  traMADol (ULTRAM) 50 MG tablet Take 1 tablet (50 mg total) by mouth every 6 (six) hours as needed. Patient taking differently: Take 50 mg by mouth every 6 (six) hours as needed for moderate pain.  08/13/17  Yes Alla Feeling, NP  potassium chloride (K-DUR) 10 MEQ tablet Take 10 mEq by mouth 3 (three) times daily. 01/07/18   [provider]    Physical Exam: There were no vitals filed for this visit.  General: Alert, Awake and Oriented to Time, Place and Person. Appear in moderate distress, affect appropriate Eyes: PERRL, Conjunctiva normal ENT: Oral Mucosa clear moist. Neck: no JVD, no Abnormal Mass  Or lumps Cardiovascular: S1 and S2 Present, no Murmur, Peripheral Pulses Present Respiratory: normal respiratory effort, Bilateral Air entry equal and Decreased, no use of accessory muscle, Clear to Auscultation, no Crackles, no wheezes Abdomen: Bowel Sound present, Soft and no tenderness, no hernia Skin: no redness, no Rash, no induration Extremities: no Pedal edema, no calf tenderness Neurologic: Grossly no focal neuro deficit. Bilaterally Equal motor strength  Labs on Admission:  CBC: Recent Labs  Lab 01/12/18 1130  WBC 0.9*  NEUTROABS 0.3*  HGB 6.6*  HCT 19.3*  MCV 102.0*  PLT 31*   Basic Metabolic Panel: Recent Labs  Lab 01/12/18 1130  NA 134*  K 2.9*  CL 99  CO2 25  GLUCOSE 116  BUN 21  CREATININE 1.61*  CALCIUM 8.1*  MG 0.8*   GFR: Estimated Creatinine Clearance: 29.5 mL/min (A) (by C-G formula based on SCr of 1.61 mg/dL (H)). Liver Function Tests: Recent Labs  Lab 01/12/18 1130  AST 49*  ALT 44  ALKPHOS 290*  BILITOT 1.1  PROT 6.8  ALBUMIN 3.2*   No results for input(s): LIPASE, AMYLASE in the last 168 hours. No results for input(s): AMMONIA in the last 168 hours. Coagulation Profile: No results for input(s): INR, PROTIME in the last 168 hours. Cardiac Enzymes: No results for input(s): CKTOTAL, CKMB, CKMBINDEX, TROPONINI in the last 168 hours. BNP (last 3 results) No results for input(s): PROBNP in the last 8760 hours. HbA1C: No results for input(s): HGBA1C in the last 72 hours. CBG: No results for input(s): GLUCAP in the last 168 hours. Lipid Profile: No results for input(s): CHOL, HDL, LDLCALC, TRIG, CHOLHDL, LDLDIRECT in the last 72 hours. Thyroid Function Tests: No results for input(s): TSH, T4TOTAL, FREET4, T3FREE, THYROIDAB in the last 72 hours. Anemia Panel: No results for input(s): VITAMINB12, FOLATE, FERRITIN, TIBC, IRON, RETICCTPCT in the last 72 hours. Urine analysis:    Component Value Date/Time   COLORURINE YELLOW 01/12/2018  [place] 01/12/2018 1058   LABSPEC 1.016 01/12/2018 1058   PHURINE 5.0 01/12/2018 1058   GLUCOSEU NEGATIVE 01/12/2018 1058   HGBUR NEGATIVE 01/12/2018 1058   BILIRUBINUR NEGATIVE 01/12/2018 1058   KETONESUR 5 (A) 01/12/2018 1058   PROTEINUR 30 (A) 01/12/2018 1058   NITRITE NEGATIVE 01/12/2018 1058   LEUKOCYTESUR NEGATIVE 01/12/2018 1058    Radiological Exams on Admission: No results found.  Assessment/Plan 1. Gastroenteritis    Febrile neutropenia (HCC)   Diarrhea of presumed infectious origin   Hypokalemia   Hypomagnesemia Pt with nausea and vomiting, also had diarrhea Some fever.  Check C diff, GI pathogen panel Given IVF Recheck K and Mg Replace as needed.  Plain x ray Continue IV Antibiotics,  Empiric cover for pneumonia as she has basal crackles in left Repeat CXR  tomorrow  2.Pancreatic cancer ([place])   Port-A-Cath in place   Anemia due to antineoplastic chemotherapy   Thrombocytopenia (HCC) 2 PRBC transfusion  Recheck after that  Nutrition: soft diet DVT Prophylaxis: mechanical compression device  Advance goals of care discussion: full code   Consults: none  Family Communication: no family was present at bedside, at the time of interview.  Disposition: Admitted as inpatient, med-surge unit. Likely to be discharged home, in 4 days.  Author: Berle Mull, MD [facility] 01/12/2018  If 7PM-7AM, please contact night-coverage [email or web address] Password [email or web address]

## 2018-01-12 NOTE — Progress Notes (Signed)
Patient is currently getting blood transfused (1of 2 units) via her chest port. She is also due to get IV ABT (Vancomycin and Cefepime) as well, on call physician Baltazar Najjar, NP notified and she advised to get a PIV or wait till after blood transfusion if patient refuses. Patient stated that she would prefer to wait until the 2 units of blood is transfused before getting the ABT as it would be difficult to get any proper rest while being connected to more than one line. On call physician Baltazar Najjar, NP notified of patient's choice.

## 2018-01-13 ENCOUNTER — Other Ambulatory Visit: Payer: Self-pay

## 2018-01-13 ENCOUNTER — Inpatient Hospital Stay (HOSPITAL_COMMUNITY): Payer: 59

## 2018-01-13 ENCOUNTER — Encounter (HOSPITAL_COMMUNITY): Payer: Self-pay

## 2018-01-13 ENCOUNTER — Other Ambulatory Visit: Payer: Self-pay | Admitting: Hematology

## 2018-01-13 DIAGNOSIS — C787 Secondary malignant neoplasm of liver and intrahepatic bile duct: Secondary | ICD-10-CM

## 2018-01-13 DIAGNOSIS — E875 Hyperkalemia: Secondary | ICD-10-CM

## 2018-01-13 DIAGNOSIS — C259 Malignant neoplasm of pancreas, unspecified: Secondary | ICD-10-CM

## 2018-01-13 DIAGNOSIS — R634 Abnormal weight loss: Secondary | ICD-10-CM

## 2018-01-13 DIAGNOSIS — R630 Anorexia: Secondary | ICD-10-CM

## 2018-01-13 DIAGNOSIS — R5081 Fever presenting with conditions classified elsewhere: Secondary | ICD-10-CM

## 2018-01-13 DIAGNOSIS — N183 Chronic kidney disease, stage 3 (moderate): Secondary | ICD-10-CM

## 2018-01-13 DIAGNOSIS — D709 Neutropenia, unspecified: Secondary | ICD-10-CM

## 2018-01-13 DIAGNOSIS — R110 Nausea: Secondary | ICD-10-CM

## 2018-01-13 DIAGNOSIS — R197 Diarrhea, unspecified: Secondary | ICD-10-CM

## 2018-01-13 DIAGNOSIS — Z681 Body mass index (BMI) 19 or less, adult: Secondary | ICD-10-CM

## 2018-01-13 LAB — CBC WITH DIFFERENTIAL/PLATELET
Basophils Absolute: 0 10*3/uL (ref 0.0–0.1)
Basophils Relative: 0 %
EOS ABS: 0 10*3/uL (ref 0.0–0.7)
Eosinophils Relative: 1 %
HEMATOCRIT: 25.8 % — AB (ref 36.0–46.0)
Hemoglobin: 9 g/dL — ABNORMAL LOW (ref 12.0–15.0)
LYMPHS ABS: 0.8 10*3/uL (ref 0.7–4.0)
Lymphocytes Relative: 23 %
MCH: 32.8 pg (ref 26.0–34.0)
MCHC: 34.9 g/dL (ref 30.0–36.0)
MCV: 94.2 fL (ref 78.0–100.0)
MONO ABS: 1.1 10*3/uL (ref 0.1–1.0)
MONOS PCT: 33 %
Neutro Abs: 1.5 10*3/uL (ref 1.7–7.7)
Neutrophils Relative %: 43 %
Platelets: 24 10*3/uL — CL (ref 150–400)
RBC: 2.74 MIL/uL — ABNORMAL LOW (ref 3.87–5.11)
RDW: 19.6 % — AB (ref 11.5–15.5)
WBC: 3.5 10*3/uL — ABNORMAL LOW (ref 4.0–10.5)

## 2018-01-13 LAB — PHOSPHORUS: Phosphorus: 2 mg/dL — ABNORMAL LOW (ref 2.5–4.6)

## 2018-01-13 LAB — URINE CULTURE: CULTURE: NO GROWTH

## 2018-01-13 LAB — CREATININE, SERUM
Creatinine, Ser: 1.35 mg/dL — ABNORMAL HIGH (ref 0.44–1.00)
GFR calc non Af Amer: 43 mL/min — ABNORMAL LOW (ref >60)
GFR, EST AFRICAN AMERICAN: 50 mL/min — AB (ref >60)

## 2018-01-13 LAB — COMPREHENSIVE METABOLIC PANEL
ALBUMIN: 2.9 g/dL — AB (ref 3.5–5.0)
ALT: 35 U/L (ref 14–54)
ANION GAP: 11 (ref 5–15)
AST: 39 U/L (ref 15–41)
Alkaline Phosphatase: 200 U/L — ABNORMAL HIGH (ref 38–126)
BILIRUBIN TOTAL: 1.4 mg/dL — AB (ref 0.3–1.2)
BUN: 19 mg/dL (ref 6–20)
CALCIUM: 7.6 mg/dL — AB (ref 8.9–10.3)
CO2: 21 mmol/L — ABNORMAL LOW (ref 22–32)
CREATININE: 1.39 mg/dL — AB (ref 0.44–1.00)
Chloride: 104 mmol/L (ref 101–111)
GFR calc Af Amer: 48 mL/min — ABNORMAL LOW (ref >60)
GFR calc non Af Amer: 42 mL/min — ABNORMAL LOW (ref >60)
GLUCOSE: 104 mg/dL — AB (ref 65–99)
POTASSIUM: 2.9 mmol/L — AB (ref 3.5–5.1)
Sodium: 136 mmol/L (ref 135–145)
TOTAL PROTEIN: 6.1 g/dL — AB (ref 6.5–8.1)

## 2018-01-13 LAB — TYPE AND SCREEN
ABO/RH(D): A POS
Antibody Screen: NEGATIVE
Unit division: 0
Unit division: 0

## 2018-01-13 LAB — BPAM RBC
BLOOD PRODUCT EXPIRATION DATE: 201906062359
Blood Product Expiration Date: 201906062359
ISSUE DATE / TIME: 201905222022
ISSUE DATE / TIME: 201905222336
UNIT TYPE AND RH: 6200
Unit Type and Rh: 6200

## 2018-01-13 LAB — C DIFFICILE QUICK SCREEN W PCR REFLEX
C Diff antigen: NEGATIVE
C Diff interpretation: NOT DETECTED
C Diff toxin: NEGATIVE

## 2018-01-13 LAB — RETICULOCYTES
RBC.: 2.77 MIL/uL — AB (ref 3.87–5.11)
RETIC CT PCT: 2.2 % (ref 0.4–3.1)
Retic Count, Absolute: 60.9 10*3/uL (ref 19.0–186.0)

## 2018-01-13 LAB — HEMOGLOBIN AND HEMATOCRIT, BLOOD
HCT: 25.8 % — ABNORMAL LOW (ref 36.0–46.0)
HEMOGLOBIN: 9.1 g/dL — AB (ref 12.0–15.0)

## 2018-01-13 LAB — HIV ANTIBODY (ROUTINE TESTING W REFLEX): HIV Screen 4th Generation wRfx: NONREACTIVE

## 2018-01-13 LAB — MAGNESIUM: MAGNESIUM: 2.1 mg/dL (ref 1.7–2.4)

## 2018-01-13 MED ORDER — POTASSIUM CHLORIDE CRYS ER 10 MEQ PO TBCR: 40.0000 meq | EXTENDED_RELEASE_TABLET | Freq: Once | ORAL | Status: DC

## 2018-01-13 MED ORDER — POTASSIUM PHOSPHATE MONOBASIC 500 MG PO TABS
500.0000 mg | ORAL_TABLET | Freq: Three times a day (TID) | ORAL | Status: DC
  Administered 2018-01-13 (×3): 500 mg via ORAL
  Filled 2018-01-13 (×5): qty 1

## 2018-01-13 MED ORDER — POTASSIUM CHLORIDE CRYS ER 20 MEQ PO TBCR: 40.0000 meq | EXTENDED_RELEASE_TABLET | Freq: Once | ORAL | Status: DC

## 2018-01-13 MED ORDER — POTASSIUM CHLORIDE CRYS ER 10 MEQ PO TBCR
40.0000 meq | EXTENDED_RELEASE_TABLET | Freq: Once | ORAL | Status: AC
  Administered 2018-01-13: 40 meq via ORAL
  Filled 2018-01-13: qty 4

## 2018-01-13 MED ORDER — VANCOMYCIN HCL 10 G IV SOLR: 1250.0000 mg | INTRAVENOUS | Status: DC

## 2018-01-13 NOTE — Progress Notes (Signed)
These preliminary result these preliminary results were noted.  Awaiting final report.

## 2018-01-13 NOTE — Progress Notes (Signed)
Morgan Dawson   DOB:November 06, 1960   HE#:[id number]   NTI#:[id number]  Oncology follow-up note  Subjective: Patient is well-known to me, under my care for her metastatic pancreatic cancer, currently on chemotherapy, last dose on Jan 03, 2018.  She was admitted to hospital yesterday for severe diarrhea, nausea,  poor p.o. Intake, and neutropenic fever.  She had 5-6 watery bowel movement today.  No more fever.  Objective:  Vitals:   01/13/18 0602 01/13/18 1348  BP: 105/65 103/66  Pulse: 76 77  Resp: 18 18  Temp: 98.9 F (37.2 C) 98.7 F (37.1 C)  SpO2: 98% 99%    Body mass index is 19.25 kg/m.  Intake/Output Summary (Last 24 hours) at 01/13/2018 1738 Last data filed at 01/13/2018 0900 Gross per 24 hour  Intake 1043.33 ml  Output -  Net 1043.33 ml     Sclerae unicteric  Oropharynx clear  No peripheral adenopathy  Lungs clear -- no rales or rhonchi  Heart regular rate and rhythm  Abdomen benign  MSK no focal spinal tenderness, no peripheral edema  Neuro nonfocal   CBG (last 3)  No results for input(s): GLUCAP in the last 72 hours.   Labs:  Lab Results  Component Value Date   WBC 3.5 (L) 01/13/2018   HGB 9.1 (L) 01/13/2018   HCT 25.8 (L) 01/13/2018   MCV 94.2 01/13/2018   PLT 24 (LL) 01/13/2018   NEUTROABS 1.5 01/13/2018   CMP Latest Ref Rng & Units 01/13/2018 01/13/2018 01/12/2018  Glucose 65 - 99 mg/dL 104(H) - 116  BUN 6 - 20 mg/dL 19 - 21  Creatinine 0.44 - 1.00 mg/dL 1.39(H) 1.35(H) 1.61(H)  Sodium 135 - 145 mmol/L 136 - 134(L)  Potassium 3.5 - 5.1 mmol/L 2.9(L) - 2.9(LL)  Chloride 101 - 111 mmol/L 104 - 99  CO2 22 - 32 mmol/L 21(L) - 25  Calcium 8.9 - 10.3 mg/dL 7.6(L) - 8.1(L)  Total Protein 6.5 - 8.1 g/dL 6.1(L) - 6.8  Total Bilirubin 0.3 - 1.2 mg/dL 1.4(H) - 1.1  Alkaline Phos 38 - 126 U/L 200(H) - 290(H)  AST 15 - 41 U/L 39 - 49(H)  ALT 14 - 54 U/L 35 - 44     Urine Studies No results for input(s): UHGB, CRYS in the last 72 hours.  Invalid input(s):  UACOL, UAPR, USPG, UPH, UTP, UGL, UKET, UBIL, UNIT, UROB, ULEU, UEPI, UWBC, URBC, UBAC, CAST, UCOM, BILUA  Basic Metabolic Panel: Recent Labs  Lab 01/12/18 1130 01/13/18 0500  NA 134* 136  K 2.9* 2.9*  CL 99 104  CO2 25 21*  GLUCOSE 116 104*  BUN 21 19  CREATININE 1.61* 1.39*  1.35*  CALCIUM 8.1* 7.6*  MG 0.8* 2.1  PHOS  --  2.0*   GFR Estimated Creatinine Clearance: 37.8 mL/min (A) (by C-G formula based on SCr of 1.39 mg/dL (H)). Liver Function Tests: Recent Labs  Lab 01/12/18 1130 01/13/18 0500  AST 49* 39  ALT 44 35  ALKPHOS 290* 200*  BILITOT 1.1 1.4*  PROT 6.8 6.1*  ALBUMIN 3.2* 2.9*   No results for input(s): LIPASE, AMYLASE in the last 168 hours. No results for input(s): AMMONIA in the last 168 hours. Coagulation profile No results for input(s): INR, PROTIME in the last 168 hours.  CBC: Recent Labs  Lab 01/12/18 1130 01/13/18 0352 01/13/18 0500  WBC 0.9* 3.5*  --   NEUTROABS 0.3* 1.5  --   HGB 6.6* 9.0* 9.1*  HCT 19.3* 25.8* 25.8*  MCV 102.0* 94.2  --   PLT 31* 24*  --    Cardiac Enzymes: No results for input(s): CKTOTAL, CKMB, CKMBINDEX, TROPONINI in the last 168 hours. BNP: Invalid input(s): POCBNP CBG: No results for input(s): GLUCAP in the last 168 hours. D-Dimer No results for input(s): DDIMER in the last 72 hours. Hgb A1c No results for input(s): HGBA1C in the last 72 hours. Lipid Profile No results for input(s): CHOL, HDL, LDLCALC, TRIG, CHOLHDL, LDLDIRECT in the last 72 hours. Thyroid function studies No results for input(s): TSH, T4TOTAL, T3FREE, THYROIDAB in the last 72 hours.  Invalid input(s): FREET3 Anemia work up Recent Labs    01/13/18 0500  RETICCTPCT 2.2   Microbiology Recent Results (from the past 240 hour(s))  Culture, Blood     Status: None (Preliminary result)   Collection Time: 01/12/18 10:59 AM  Result Value Ref Range Status   Specimen Description BLOOD PORTA CATH  Final   Special Requests   Final    BOTTLES  DRAWN AEROBIC AND ANAEROBIC Blood Culture adequate volume   Culture   Final    NO GROWTH < 24 HOURS Performed at [facility], [address], [place], [place] [address]    Report Status PENDING  Incomplete  Culture, Urine     Status: None   Collection Time: 01/12/18 11:00 AM  Result Value Ref Range Status   Specimen Description   Final    URINE, CLEAN CATCH Performed at [facility], [place] [address], [place], [place] [address]    Special Requests   Final    NONE Performed at [facility], [place] [address], [place], [place] [address]    Culture   Final    NO GROWTH Performed at [facility], [place] [address] [place], [place] [address]    Report Status 01/13/2018 FINAL  Final  Culture, Blood     Status: None (Preliminary result)   Collection Time: 01/12/18 11:25 AM  Result Value Ref Range Status   Specimen Description BLOOD LEFT ANTECUBITAL  Final   Special Requests   Final    BOTTLES DRAWN AEROBIC AND ANAEROBIC Blood Culture adequate volume   Culture   Final    NO GROWTH < 24 HOURS Performed at [facility], [place] [address], [place], [place] [address]    Report Status PENDING  Incomplete  C difficile quick scan w PCR reflex     Status: None   Collection Time: 01/13/18  8:00 AM  Result Value Ref Range Status   C Diff antigen NEGATIVE NEGATIVE Final   C Diff toxin NEGATIVE NEGATIVE Final   C Diff interpretation No C. difficile detected.  Final    Comment: Performed at [facility], [place] [address] [place], [place] [address]      Studies:  X-ray Chest Pa And Lateral  Result Date: 01/13/2018 CLINICAL DATA:  Cough.  Lung crackles. EXAM: CHEST - 2 VIEW COMPARISON:  CT 10/14/2017.  Chest x-ray 08/20/2018. FINDINGS: PowerPort catheter with tip over superior vena cava. Heart size normal. No focal infiltrate. Tiny bilateral pleural effusions. No acute bony abnormality. IMPRESSION: 1.  PowerPort  catheter noted with tip in superior vena cava. 2. Tiny bilateral pleural effusions. Exam otherwise unremarkable. No focal pulmonary infiltrate noted. Electronically Signed   By: Marcello Moores  Register   On: 01/13/2018 10:52    Assessment: 57 y.o. female with metastatic pancreatic cancer to liver, currently on chemotherapy FOLFIRINOX, last  dose on Jan 03, 2018, presented with nausea, diarrhea, poor p.o. intake, and a neutropenic fever.  1.  Neutropenic fever, ID work up negative so far 2.  Severe pancytopenia, secondary to chemotherapy, received blood transfusion 5/22 3.  Metastatic pancreatic cancer to liver, currently on chemotherapy 4.  Nausea, diarrhea, anorexia, and weight loss 5.  CKD, stage III 6.  Persistent hyperkalemia  Plan:  -Stool C. difficile was negative, blood and urine culture negative so far, GI panel still pending -Agree with broad antibiotics for her neutropenic fever -She received Neulasta on May 15, no need additional Granix -may need plt transfusion if plt<15K, she has ecchymosis on her legs, no active bleeding -I think her symptoms are most related to her recent chemotherapy, plan to reduce her dose on next cycle.  I will postpone her next cycle for a week -continue other supportive care  -I will f/u as needed. I appreciate the excellent care from the hospitalist team    [name], MD 01/13/2018  5:38 PM

## 2018-01-13 NOTE — Progress Notes (Signed)
[facility] Progress Note  Patient: Morgan Dawson NKN:[id number]   PCP: [name], MD DOB: Jan 02, 1961   DOA: 01/12/2018   DOS: 01/13/2018   Date of Service: the patient was seen and examined on 01/13/2018  Subjective: Continues to have diarrhea although frequency is better, no episodes of vomiting some nausea yesterday. No blood in the stool no abdominal pain no chest pain or shortness of breath no cough.  Brief hospital course: Pt. with PMH of Stage IV Pancreatic cancer with liver mets, chemotherapy with FOLFIRINOX regimen.  Last chemotherapy was on 01/20/2018, cycle 10-day 1; admitted on 01/12/2018, presented with complaint of diarrhea and vomiting, was found to have viral gastroenteritis. Currently further plan is continue supportive measures.  Assessment and Plan: 1. Gastroenteritis    Febrile neutropenia (HCC)   Diarrhea of presumed infectious origin   Hypokalemia   Hypomagnesemia Pt with nausea and vomiting, also had diarrhea Some fever.  Negative C diff, pending GI pathogen panel Urine culture shows no growth, blood cultures so far negative as well. Continue IVF Recheck K and Mg, and replace as needed Continue IV Antibiotics, started on broad-spectrum coverage with vancomycin and cefepime, examination as well as chest x-ray shows no evidence of pneumonia therefore will discontinue vancomycin continue cefepime for febrile neutropenia. Absolute neutrophil count improved from 0.3-1.5 today.  2.Pancreatic cancer ([place])   Port-A-Cath in place   Anemia due to antineoplastic chemotherapy   Thrombocytopenia (HCC) 2 PRBC transfusion  Repeat H&H shows appropriate increase. Reticulocyte count is normal suggesting no evidence of acute hemorrhage or acute hematopoiesis.  3.  Chronic kidney disease stage III-IV. Recent serum creatinine has been trending between 1.8 and 1.9, GFR is trending less than 30 meeting stage IV criteria. Although after receiving IV hydration since  admission serum creatinine has improved significantly. Continue hydration for now.  4 hyperbilirubinemia. Serum bilirubin elevated at 1.4.  LFTs stable no abdominal pain. Monitor for now.   Diet: soft diet DVT Prophylaxis: subcutaneous Heparin  Advance goals of care discussion: full code  Family Communication: no family was present at bedside, at the time of interview.   Disposition:  Discharge to home.  Consultants: None Procedures: PRBC transfusion  Antibiotics: Anti-infectives (From admission, onward)   Start     Dose/Rate Route Frequency Ordered Stop   01/15/18 0600  vancomycin (VANCOCIN) IVPB 750 mg/150 ml premix  Status:  Discontinued     750 mg 150 mL/hr over 60 Minutes Intravenous Every 48 hours 01/12/18 1846 01/13/18 0728   01/15/18 0600  vancomycin (VANCOCIN) 1,250 mg in sodium chloride 0.9 % 250 mL IVPB  Status:  Discontinued     1,250 mg 166.7 mL/hr over 90 Minutes Intravenous Every 48 hours 01/13/18 0728 01/13/18 1322   01/12/18 1930  ceFEPIme (MAXIPIME) 2 g in sodium chloride 0.9 % 100 mL IVPB     2 g 200 mL/hr over 30 Minutes Intravenous Every 24 hours 01/12/18 1846     01/12/18 1900  vancomycin (VANCOCIN) IVPB 1000 mg/200 mL premix     1,000 mg 200 mL/hr over 60 Minutes Intravenous  Once 01/12/18 1846 01/13/18 0529       Objective: Physical Exam: Vitals:   01/12/18 2345 01/13/18 0245 01/13/18 0602 01/13/18 1348  BP: (!) 94/55 107/68 105/65 103/66  Pulse: 75 78 76 77  Resp: 18 18 18 18   Temp: 99.3 F (37.4 C) 98.6 F (37 C) 98.9 F (37.2 C) 98.7 F (37.1 C)  TempSrc: Oral Oral Oral Oral  SpO2: 99%  99% 98% 99%  Weight:   52.4 kg (115 lb 8.3 oz)   Height:        Intake/Output Summary (Last 24 hours) at 01/13/2018 1637 Last data filed at 01/13/2018 0900 Gross per 24 hour  Intake 1043.33 ml  Output -  Net 1043.33 ml   Filed Weights   01/12/18 2025 01/13/18 0602  Weight: 47.3 kg (104 lb 4.4 oz) 52.4 kg (115 lb 8.3 oz)   General: Alert, Awake  and Oriented to Time, Place and Person. Appear in mild distress, affect appropriate Eyes: PERRL, Conjunctiva normal ENT: Oral Mucosa clear moist Neck: no JVD, no Abnormal Mass Or lumps Cardiovascular: S1 and S2 Present, no Murmur, Peripheral Pulses Present Respiratory: normal respiratory effort, Bilateral Air entry equal and Decreased, no use of accessory muscle, Clear to Auscultation, no Crackles, no wheezes Abdomen: Bowel Sound present, Soft and no tenderness, no hernia Skin: no redness, no Rash, no induration Extremities: no Pedal edema, no calf tenderness Neurologic: Grossly no focal neuro deficit. Bilaterally Equal motor strength  Data Reviewed: CBC: Recent Labs  Lab 01/12/18 1130 01/13/18 0352 01/13/18 0500  WBC 0.9* 3.5*  --   NEUTROABS 0.3* 1.5  --   HGB 6.6* 9.0* 9.1*  HCT 19.3* 25.8* 25.8*  MCV 102.0* 94.2  --   PLT 31* 24*  --    Basic Metabolic Panel: Recent Labs  Lab 01/12/18 1130 01/13/18 0500  NA 134* 136  K 2.9* 2.9*  CL 99 104  CO2 25 21*  GLUCOSE 116 104*  BUN 21 19  CREATININE 1.61* 1.39*  1.35*  CALCIUM 8.1* 7.6*  MG 0.8* 2.1  PHOS  --  2.0*    Liver Function Tests: Recent Labs  Lab 01/12/18 1130 01/13/18 0500  AST 49* 39  ALT 44 35  ALKPHOS 290* 200*  BILITOT 1.1 1.4*  PROT 6.8 6.1*  ALBUMIN 3.2* 2.9*   No results for input(s): LIPASE, AMYLASE in the last 168 hours. No results for input(s): AMMONIA in the last 168 hours. Coagulation Profile: No results for input(s): INR, PROTIME in the last 168 hours. Cardiac Enzymes: No results for input(s): CKTOTAL, CKMB, CKMBINDEX, TROPONINI in the last 168 hours. BNP (last 3 results) No results for input(s): PROBNP in the last 8760 hours. CBG: No results for input(s): GLUCAP in the last 168 hours. Studies: X-ray Chest Pa And Lateral  Result Date: 01/13/2018 CLINICAL DATA:  Cough.  Lung crackles. EXAM: CHEST - 2 VIEW COMPARISON:  CT 10/14/2017.  Chest x-ray 08/20/2018. FINDINGS: PowerPort  catheter with tip over superior vena cava. Heart size normal. No focal infiltrate. Tiny bilateral pleural effusions. No acute bony abnormality. IMPRESSION: 1.  PowerPort catheter noted with tip in superior vena cava. 2. Tiny bilateral pleural effusions. Exam otherwise unremarkable. No focal pulmonary infiltrate noted. Electronically Signed   By: Marcello Moores  Register   On: 01/13/2018 10:52    Scheduled Meds: . dronabinol  2.5 mg Oral BID AC  . potassium phosphate (monobasic)  500 mg Oral TID WC & HS  . sodium chloride flush  3 mL Intravenous Q12H   Continuous Infusions: . 0.45 % NaCl with KCl 20 mEq / L 100 mL/hr at 01/13/18 1347  . ceFEPime (MAXIPIME) IV Stopped (01/13/18 0343)   PRN Meds: acetaminophen **OR** acetaminophen, meclizine, ondansetron **OR** ondansetron (ZOFRAN) IV, traMADol  Time spent: 35 minutes  Author: Berle Mull, MD [facility] Pager: [phone] 01/13/2018 4:37 PM  If 7PM-7AM, please contact night-coverage at [email or web address], password [facility]

## 2018-01-13 NOTE — Progress Notes (Signed)
Patient request to have 1700 K+ dose at a later time.   Ave Filter, RN

## 2018-01-14 DIAGNOSIS — E876 Hypokalemia: Secondary | ICD-10-CM

## 2018-01-14 LAB — BASIC METABOLIC PANEL
Anion gap: 10 (ref 5–15)
BUN: 14 mg/dL (ref 6–20)
CALCIUM: 8.2 mg/dL — AB (ref 8.9–10.3)
CO2: 20 mmol/L — ABNORMAL LOW (ref 22–32)
CREATININE: 1.31 mg/dL — AB (ref 0.44–1.00)
Chloride: 107 mmol/L (ref 101–111)
GFR, EST AFRICAN AMERICAN: 52 mL/min — AB (ref >60)
GFR, EST NON AFRICAN AMERICAN: 45 mL/min — AB (ref >60)
Glucose, Bld: 99 mg/dL (ref 65–99)
Potassium: 2.8 mmol/L — ABNORMAL LOW (ref 3.5–5.1)
SODIUM: 137 mmol/L (ref 135–145)

## 2018-01-14 LAB — CBC WITH DIFFERENTIAL/PLATELET
BASOS ABS: 0 10*3/uL (ref 0.0–0.1)
BASOS PCT: 0 %
EOS ABS: 0.1 10*3/uL (ref 0.0–0.7)
Eosinophils Relative: 1 %
HCT: 28.2 % — ABNORMAL LOW (ref 36.0–46.0)
Hemoglobin: 9.7 g/dL — ABNORMAL LOW (ref 12.0–15.0)
Lymphocytes Relative: 11 %
Lymphs Abs: 1.1 10*3/uL (ref 0.7–4.0)
MCH: 32.8 pg (ref 26.0–34.0)
MCHC: 34.4 g/dL (ref 30.0–36.0)
MCV: 95.3 fL (ref 78.0–100.0)
Monocytes Absolute: 1.6 10*3/uL (ref 0.1–1.0)
Monocytes Relative: 16 %
NEUTROS PCT: 72 %
Neutro Abs: 7.1 10*3/uL (ref 1.7–7.7)
PLATELETS: 39 10*3/uL — AB (ref 150–400)
RBC: 2.96 MIL/uL — AB (ref 3.87–5.11)
RDW: 20.2 % — AB (ref 11.5–15.5)
WBC: 9.8 10*3/uL (ref 4.0–10.5)

## 2018-01-14 LAB — CREATININE, SERUM
CREATININE: 1.32 mg/dL — AB (ref 0.44–1.00)
GFR, EST AFRICAN AMERICAN: 52 mL/min — AB (ref >60)
GFR, EST NON AFRICAN AMERICAN: 44 mL/min — AB (ref >60)

## 2018-01-14 MED ORDER — POTASSIUM CHLORIDE CRYS ER 20 MEQ PO TBCR: 30.0000 meq | EXTENDED_RELEASE_TABLET | Freq: Every day | ORAL | Status: DC

## 2018-01-14 MED ORDER — POTASSIUM CHLORIDE IN NACL 20-0.45 MEQ/L-% IV SOLN
INTRAVENOUS | Status: DC
  Administered 2018-01-14 (×2): via INTRAVENOUS
  Filled 2018-01-14 (×3): qty 1000

## 2018-01-14 MED ORDER — LOPERAMIDE HCL 2 MG PO CAPS
2.0000 mg | ORAL_CAPSULE | ORAL | Status: DC | PRN
  Administered 2018-01-15: 2 mg via ORAL
  Filled 2018-01-14: qty 1

## 2018-01-14 MED ORDER — POTASSIUM CHLORIDE CRYS ER 20 MEQ PO TBCR
30.0000 meq | EXTENDED_RELEASE_TABLET | Freq: Two times a day (BID) | ORAL | Status: DC
  Filled 2018-01-14: qty 1

## 2018-01-14 MED ORDER — POTASSIUM CHLORIDE CRYS ER 10 MEQ PO TBCR
30.0000 meq | EXTENDED_RELEASE_TABLET | Freq: Two times a day (BID) | ORAL | Status: DC
  Administered 2018-01-14 – 2018-01-15 (×2): 30 meq via ORAL
  Filled 2018-01-14 (×2): qty 3

## 2018-01-14 MED ORDER — FAMOTIDINE 20 MG PO TABS
20.0000 mg | ORAL_TABLET | Freq: Two times a day (BID) | ORAL | Status: DC
  Administered 2018-01-14: 20 mg via ORAL
  Filled 2018-01-14 (×2): qty 1

## 2018-01-14 NOTE — Progress Notes (Signed)
Patient has refused oral meds due to them causing stomach discomfort and nausea.

## 2018-01-14 NOTE — Progress Notes (Signed)
[facility] Progress Note  Patient: Morgan Dawson [id number]:[id number]   PCP: [name], MD DOB: 1961/07/08   DOA: 01/12/2018   DOS: 01/14/2018   Date of Service: the patient was seen and examined on 01/14/2018  Subjective: Patient had an episode of vomiting this morning.  Still had diarrhea.  No abdominal pain  Brief hospital course: Pt. with PMH of Stage IV Pancreatic cancer with liver mets, chemotherapy with FOLFIRINOX regimen.  Last chemotherapy was on 01/20/2018, cycle 10-day 1; admitted on 01/12/2018, presented with complaint of diarrhea and vomiting, was found to have viral gastroenteritis. Currently further plan is continue supportive measures.  Assessment and Plan: 1. Gastroenteritis    Febrile neutropenia (HCC)   Diarrhea of presumed infectious origin   Hypokalemia   Hypomagnesemia Pt with nausea and vomiting, also had diarrhea Some fever.  Negative C diff, pending GI pathogen panel start Imodium Urine culture shows no growth, blood cultures so far negative as well. Continue IVF Recheck K and Mg, and replace as needed Continue IV Antibiotics, started on broad-spectrum coverage with vancomycin and cefepime, examination as well as chest x-ray shows no evidence of pneumonia therefore will discontinue vancomycin continue cefepime for febrile neutropenia. Absolute neutrophil count improved   2.Pancreatic cancer ([place])   Port-A-Cath in place   Anemia due to antineoplastic chemotherapy   Thrombocytopenia (HCC) 2 PRBC transfusion  Repeat H&H shows appropriate increase. Reticulocyte count is normal suggesting no evidence of acute hemorrhage or acute hematopoiesis.  3.  Chronic kidney disease stage III-IV. Recent serum creatinine has been trending between 1.8 and 1.9, GFR is trending less than 30 meeting stage IV criteria. Although after receiving IV hydration since admission serum creatinine has improved significantly. Continue hydration for now.  4 hyperbilirubinemia. Serum  bilirubin elevated at 1.4.  LFTs stable no abdominal pain. Monitor for now.   Diet: Back to full liquid diet as patient had nausea and vomiting while on soft diet DVT Prophylaxis: scd  Advance goals of care discussion: full code  Family Communication: no family was present at bedside, at the time of interview.   Disposition:  Discharge to home.  Consultants: None Procedures: PRBC transfusion  Antibiotics: Anti-infectives (From admission, onward)   Start     Dose/Rate Route Frequency Ordered Stop   01/15/18 0600  vancomycin (VANCOCIN) IVPB 750 mg/150 ml premix  Status:  Discontinued     750 mg 150 mL/hr over 60 Minutes Intravenous Every 48 hours 01/12/18 1846 01/13/18 0728   01/15/18 0600  vancomycin (VANCOCIN) 1,250 mg in sodium chloride 0.9 % 250 mL IVPB  Status:  Discontinued     1,250 mg 166.7 mL/hr over 90 Minutes Intravenous Every 48 hours 01/13/18 0728 01/13/18 1322   01/12/18 1930  ceFEPIme (MAXIPIME) 2 g in sodium chloride 0.9 % 100 mL IVPB     2 g 200 mL/hr over 30 Minutes Intravenous Every 24 hours 01/12/18 1846     01/12/18 1900  vancomycin (VANCOCIN) IVPB 1000 mg/200 mL premix     1,000 mg 200 mL/hr over 60 Minutes Intravenous  Once 01/12/18 1846 01/13/18 0529       Objective: Physical Exam: Vitals:   01/13/18 1348 01/13/18 2152 01/14/18 0622 01/14/18 1323  BP: 103/66 118/71 104/66 114/79  Pulse: 77 82 87 79  Resp: 18 17 16 16   Temp: 98.7 F (37.1 C) 98.7 F (37.1 C) 98.2 F (36.8 C) 98.2 F (36.8 C)  TempSrc: Oral Oral Oral Oral  SpO2: 99% 98% 98% 99%  Weight:  Height:        Intake/Output Summary (Last 24 hours) at 01/14/2018 1855 Last data filed at 01/14/2018 1700 Gross per 24 hour  Intake 2573.34 ml  Output -  Net 2573.34 ml   Filed Weights   01/12/18 2025 01/13/18 0602  Weight: 47.3 kg (104 lb 4.4 oz) 52.4 kg (115 lb 8.3 oz)   General: Alert, Awake and Oriented to Time, Place and Person. Appear in mild distress, affect  appropriate Eyes: PERRL, Conjunctiva normal ENT: Oral Mucosa clear moist Neck: no JVD, no Abnormal Mass Or lumps Cardiovascular: S1 and S2 Present, no Murmur, Peripheral Pulses Present Respiratory: normal respiratory effort, Bilateral Air entry equal and Decreased, no use of accessory muscle, Clear to Auscultation, no Crackles, no wheezes Abdomen: Bowel Sound present, Soft and no tenderness, no hernia Skin: no redness, no Rash, no induration Extremities: no Pedal edema, no calf tenderness Neurologic: Grossly no focal neuro deficit. Bilaterally Equal motor strength  Data Reviewed: CBC: Recent Labs  Lab 01/12/18 1130 01/13/18 0352 01/13/18 0500 01/14/18 0847  WBC 0.9* 3.5*  --  9.8  NEUTROABS 0.3* 1.5  --  7.1  HGB 6.6* 9.0* 9.1* 9.7*  HCT 19.3* 25.8* 25.8* 28.2*  MCV 102.0* 94.2  --  95.3  PLT 31* 24*  --  39*   Basic Metabolic Panel: Recent Labs  Lab 01/12/18 1130 01/13/18 0500 01/14/18 0453 01/14/18 0847  NA 134* 136  --  137  K 2.9* 2.9*  --  2.8*  CL 99 104  --  107  CO2 25 21*  --  20*  GLUCOSE 116 104*  --  99  BUN 21 19  --  14  CREATININE 1.61* 1.39*  1.35* 1.32* 1.31*  CALCIUM 8.1* 7.6*  --  8.2*  MG 0.8* 2.1  --   --   PHOS  --  2.0*  --   --     Liver Function Tests: Recent Labs  Lab 01/12/18 1130 01/13/18 0500  AST 49* 39  ALT 44 35  ALKPHOS 290* 200*  BILITOT 1.1 1.4*  PROT 6.8 6.1*  ALBUMIN 3.2* 2.9*   No results for input(s): LIPASE, AMYLASE in the last 168 hours. No results for input(s): AMMONIA in the last 168 hours. Coagulation Profile: No results for input(s): INR, PROTIME in the last 168 hours. Cardiac Enzymes: No results for input(s): CKTOTAL, CKMB, CKMBINDEX, TROPONINI in the last 168 hours. BNP (last 3 results) No results for input(s): PROBNP in the last 8760 hours. CBG: No results for input(s): GLUCAP in the last 168 hours. Studies: No results found.  Scheduled Meds: . famotidine  20 mg Oral BID  . potassium chloride  30  mEq Oral BID  . sodium chloride flush  3 mL Intravenous Q12H   Continuous Infusions: . 0.45 % NaCl with KCl 20 mEq / L 75 mL/hr at 01/14/18 0953  . ceFEPime (MAXIPIME) IV Stopped (01/13/18 2254)   PRN Meds: acetaminophen **OR** acetaminophen, loperamide, meclizine, ondansetron **OR** ondansetron (ZOFRAN) IV, traMADol  Time spent: 35 minutes  Author: Berle Mull, MD [facility] Pager: [phone] 01/14/2018 6:55 PM  If 7PM-7AM, please contact night-coverage at [email or web address], password [facility]

## 2018-01-15 LAB — BASIC METABOLIC PANEL
Anion gap: 10 (ref 5–15)
BUN: 13 mg/dL (ref 6–20)
CO2: 19 mmol/L — ABNORMAL LOW (ref 22–32)
Calcium: 8.4 mg/dL — ABNORMAL LOW (ref 8.9–10.3)
Chloride: 106 mmol/L (ref 101–111)
Creatinine, Ser: 1.24 mg/dL — ABNORMAL HIGH (ref 0.44–1.00)
GFR calc Af Amer: 56 mL/min — ABNORMAL LOW (ref >60)
GFR, EST NON AFRICAN AMERICAN: 48 mL/min — AB (ref >60)
Glucose, Bld: 90 mg/dL (ref 65–99)
Potassium: 3.5 mmol/L (ref 3.5–5.1)
SODIUM: 135 mmol/L (ref 135–145)

## 2018-01-15 MED ORDER — HEPARIN SOD (PORK) LOCK FLUSH 100 UNIT/ML IV SOLN
500.0000 [IU] | Freq: Once | INTRAVENOUS | Status: AC
  Administered 2018-01-15: 500 [IU] via INTRAVENOUS
  Filled 2018-01-15: qty 5

## 2018-01-15 MED ORDER — LOPERAMIDE HCL 2 MG PO CAPS: 2.0000 mg | ORAL_CAPSULE | ORAL | 0 refills | Status: AC | PRN

## 2018-01-15 MED ORDER — SODIUM CHLORIDE 0.9 % IV SOLN
2.0000 g | Freq: Two times a day (BID) | INTRAVENOUS | Status: DC
  Filled 2018-01-15: qty 2

## 2018-01-15 MED ORDER — FAMOTIDINE 20 MG PO TABS: 20.0000 mg | ORAL_TABLET | Freq: Every day | ORAL | 0 refills | Status: DC

## 2018-01-15 NOTE — Progress Notes (Signed)
Morgan Dawson   DOB:[birth year]   VQ#:[id number]   KDT#:[id number]  Oncology follow-up note  Subjective: Patient had severe diarrhea this last night and this morning, but not much during the day today. Appetite still low, able to drink liquid well. No fever or other new complains   Objective:  Vitals:   01/14/18 2248 01/15/18 0536  BP: 125/82 115/69  Pulse: 86 80  Resp: 17 16  Temp: 98.9 F (37.2 C) 98.3 F (36.8 C)  SpO2: 99% 98%    Body mass index is 18.48 kg/m.  Intake/Output Summary (Last 24 hours) at 01/15/2018 0755 Last data filed at 01/15/2018 0408 Gross per 24 hour  Intake 2370 ml  Output -  Net 2370 ml     Sclerae unicteric  Oropharynx clear  No peripheral adenopathy  Lungs clear -- no rales or rhonchi  Heart regular rate and rhythm  Abdomen benign  MSK no focal spinal tenderness, no peripheral edema  Neuro nonfocal   CBG (last 3)  No results for input(s): GLUCAP in the last 72 hours.   Labs:  Lab Results  Component Value Date   WBC 9.8 01/14/2018   HGB 9.7 (L) 01/14/2018   HCT 28.2 (L) 01/14/2018   MCV 95.3 01/14/2018   PLT 39 (L) 01/14/2018   NEUTROABS 7.1 01/14/2018   CMP Latest Ref Rng & Units 01/14/2018 01/14/2018 01/13/2018  Glucose 65 - 99 mg/dL 99 - 104(H)  BUN 6 - 20 mg/dL 14 - 19  Creatinine 0.44 - 1.00 mg/dL 1.31(H) 1.32(H) 1.39(H)  Sodium 135 - 145 mmol/L 137 - 136  Potassium 3.5 - 5.1 mmol/L 2.8(L) - 2.9(L)  Chloride 101 - 111 mmol/L 107 - 104  CO2 22 - 32 mmol/L 20(L) - 21(L)  Calcium 8.9 - 10.3 mg/dL 8.2(L) - 7.6(L)  Total Protein 6.5 - 8.1 g/dL - - 6.1(L)  Total Bilirubin 0.3 - 1.2 mg/dL - - 1.4(H)  Alkaline Phos 38 - 126 U/L - - 200(H)  AST 15 - 41 U/L - - 39  ALT 14 - 54 U/L - - 35     Urine Studies No results for input(s): UHGB, CRYS in the last 72 hours.  Invalid input(s): UACOL, UAPR, USPG, UPH, UTP, UGL, UKET, UBIL, UNIT, UROB, ULEU, UEPI, UWBC, [place], [place], [place], [place], [place]  Basic Metabolic Panel: Recent Labs  Lab  01/12/18 1130 01/13/18 0500 01/14/18 0453 01/14/18 0847  NA 134* 136  --  137  K 2.9* 2.9*  --  2.8*  CL 99 104  --  107  CO2 25 21*  --  20*  GLUCOSE 116 104*  --  99  BUN 21 19  --  14  CREATININE 1.61* 1.39*  1.35* 1.32* 1.31*  CALCIUM 8.1* 7.6*  --  8.2*  MG 0.8* 2.1  --   --   PHOS  --  2.0*  --   --    GFR Estimated Creatinine Clearance: 38.5 mL/min (A) (by C-G formula based on SCr of 1.31 mg/dL (H)). Liver Function Tests: Recent Labs  Lab 01/12/18 1130 01/13/18 0500  AST 49* 39  ALT 44 35  ALKPHOS 290* 200*  BILITOT 1.1 1.4*  PROT 6.8 6.1*  ALBUMIN 3.2* 2.9*   No results for input(s): LIPASE, AMYLASE in the last 168 hours. No results for input(s): AMMONIA in the last 168 hours. Coagulation profile No results for input(s): INR, PROTIME in the last 168 hours.  CBC: Recent Labs  Lab 01/12/18 1130 01/13/18 0352 01/13/18 0500  01/14/18 0847  WBC 0.9* 3.5*  --  9.8  NEUTROABS 0.3* 1.5  --  7.1  HGB 6.6* 9.0* 9.1* 9.7*  HCT 19.3* 25.8* 25.8* 28.2*  MCV 102.0* 94.2  --  95.3  PLT 31* 24*  --  39*   Cardiac Enzymes: No results for input(s): CKTOTAL, CKMB, CKMBINDEX, TROPONINI in the last 168 hours. BNP: Invalid input(s): POCBNP CBG: No results for input(s): GLUCAP in the last 168 hours. D-Dimer No results for input(s): DDIMER in the last 72 hours. Hgb A1c No results for input(s): HGBA1C in the last 72 hours. Lipid Profile No results for input(s): CHOL, HDL, LDLCALC, TRIG, CHOLHDL, LDLDIRECT in the last 72 hours. Thyroid function studies No results for input(s): TSH, T4TOTAL, T3FREE, THYROIDAB in the last 72 hours.  Invalid input(s): FREET3 Anemia work up Recent Labs    01/13/18 0500  RETICCTPCT 2.2   Microbiology Recent Results (from the past 240 hour(s))  Culture, Blood     Status: None (Preliminary result)   Collection Time: 01/12/18 10:59 AM  Result Value Ref Range Status   Specimen Description BLOOD PORTA CATH  Final   Special Requests    Final    BOTTLES DRAWN AEROBIC AND ANAEROBIC Blood Culture adequate volume   Culture   Final    NO GROWTH 2 DAYS Performed at [facility], [address] [place], [place] [address]    Report Status PENDING  Incomplete  Culture, Urine     Status: None   Collection Time: 01/12/18 11:00 AM  Result Value Ref Range Status   Specimen Description   Final    URINE, CLEAN CATCH Performed at [facility], [place] [address] [place], [place] [address]    Special Requests   Final    NONE Performed at [facility], [place] [address], [place], [place] [address]    Culture   Final    NO GROWTH Performed at [facility], [place] [address] [place], [place] [address]    Report Status 01/13/2018 FINAL  Final  Culture, Blood     Status: None (Preliminary result)   Collection Time: 01/12/18 11:25 AM  Result Value Ref Range Status   Specimen Description BLOOD LEFT ANTECUBITAL  Final   Special Requests   Final    BOTTLES DRAWN AEROBIC AND ANAEROBIC Blood Culture adequate volume   Culture   Final    NO GROWTH 2 DAYS Performed at [facility], [place] [address], [place], [place] [address]    Report Status PENDING  Incomplete  C difficile quick scan w PCR reflex     Status: None   Collection Time: 01/13/18  8:00 AM  Result Value Ref Range Status   C Diff antigen NEGATIVE NEGATIVE Final   C Diff toxin NEGATIVE NEGATIVE Final   C Diff interpretation No C. difficile detected.  Final    Comment: Performed at [facility], [place] [address], [place], [place] [address]      Studies:  X-ray Chest Pa And Lateral  Result Date: 01/13/2018 CLINICAL DATA:  Cough.  Lung crackles. EXAM: CHEST - 2 VIEW COMPARISON:  CT 10/14/2017.  Chest x-ray 08/20/2018. FINDINGS: PowerPort catheter with tip over superior vena cava. Heart size normal. No focal infiltrate. Tiny bilateral pleural effusions. No acute bony abnormality. IMPRESSION: 1.   PowerPort catheter noted with tip in superior vena cava. 2. Tiny bilateral pleural effusions. Exam otherwise unremarkable. No focal pulmonary infiltrate noted.  Electronically Signed   By: Marcello Moores  Register   On: 01/13/2018 10:52    Assessment: 57 y.o. female with metastatic pancreatic cancer to liver, currently on chemotherapy FOLFIRINOX, last dose on Jan 03, 2018, presented with nausea, diarrhea, poor p.o. intake, and a neutropenic fever.  1.  Neutropenic fever, ID work up negative so far 2.  Severe pancytopenia, secondary to chemotherapy, received blood transfusion 5/22 3.  Metastatic pancreatic cancer to liver, currently on chemotherapy 4.  Nausea, diarrhea, anorexia, and weight loss 5.  CKD, stage III 6.  Persistent hypokalemia  Plan:  -GI panel is still pending  -I encourage pt to use imodium as needed  -thrombocytopenia improving, likely recovering from chemo, neutropenia has resolved  -K still low, which is chronic for her  -Pt wants to go home tomorrow, I encourage her to ambulate  -I will cancel her chemo on 5/28. I will f/u within a week after discharge    [name], MD 01/14/2018

## 2018-01-15 NOTE — Discharge Instructions (Signed)

## 2018-01-15 NOTE — Progress Notes (Signed)
Pharmacy Antibiotic Note  Morgan Dawson is a 57 y.o. female with metastatic pancreatic cancer currently on chemotherapy treatment, presented to the ED on 01/12/2018 with c/o diarrhea.  To start vancomycin and cefepime for febrile neutropenia.  Today, 01/15/2018:  D4 full abx  WBC back to normal  SCr improved  Per micro lab, Cdiff was run, but GI pathogen PCR was never sent to [facility] on 5/23; will send today  Plan:  Will increase cefepime to 2g IV q12 hr - patient no longer neutropenic, but did present with high risk of pseudomonal infection; will continue antipseudomonal dosing   Height: 5' 4.96" (165 cm) Weight: 110 lb 14.4 oz (50.3 kg) IBW/kg (Calculated) : 56.91_____________________________________________   Temp (24hrs), Avg:98.6 F (37 C), Min:98.3 F (36.8 C), Max:98.9 F (37.2 C)  Recent Labs  Lab 01/12/18 1130 01/13/18 0352 01/13/18 0500 01/14/18 0453 01/14/18 0847 01/15/18 0900  WBC 0.9* 3.5*  --   --  9.8  --   CREATININE 1.61*  --  1.39*  1.35* 1.32* 1.31* 1.24*    Estimated Creatinine Clearance: 40.7 mL/min (A) (by C-G formula based on SCr of 1.24 mg/dL (H)).    No Known Allergies  Antimicrobials this admission:  5/22 vanc >> 5/24 5/22 cefepime >>  Dose adjustments this admission:   Microbiology results:  5/22 BCx x2: ngtd 5/22 UCx: NGF 5/23 CDiff:neg/neg 5/23 GI PCR: sent - Strep pneumo:Legionella: ordered  Thank you for allowing pharmacy to be a part of this patient's care.  Reuel Boom, PharmD, BCPS [phone] 01/15/2018, 1:34 PM

## 2018-01-16 LAB — GASTROINTESTINAL PANEL BY PCR, STOOL (REPLACES STOOL CULTURE)
ASTROVIRUS: NOT DETECTED
Adenovirus F40/41: NOT DETECTED
CYCLOSPORA CAYETANENSIS: NOT DETECTED
Campylobacter species: NOT DETECTED
Cryptosporidium: NOT DETECTED
ENTAMOEBA HISTOLYTICA: NOT DETECTED
Enteroaggregative E coli (EAEC): NOT DETECTED
Enteropathogenic E coli (EPEC): NOT DETECTED
Enterotoxigenic E coli (ETEC): NOT DETECTED
Giardia lamblia: NOT DETECTED
NOROVIRUS GI/GII: NOT DETECTED
PLESIMONAS SHIGELLOIDES: NOT DETECTED
Rotavirus A: NOT DETECTED
SAPOVIRUS (I, II, IV, AND V): NOT DETECTED
Salmonella species: NOT DETECTED
Shiga like toxin producing E coli (STEC): NOT DETECTED
Shigella/Enteroinvasive E coli (EIEC): NOT DETECTED
VIBRIO CHOLERAE: NOT DETECTED
VIBRIO SPECIES: NOT DETECTED
YERSINIA ENTEROCOLITICA: NOT DETECTED

## 2018-01-16 NOTE — Discharge Summary (Signed)
[facility] Discharge Summary   Patient: Morgan Dawson FTD:[id number]   PCP: [name], MD DOB: 12-Oct-1960   Date of admission: 01/12/2018   Date of discharge: 01/15/2018     Discharge Diagnoses:  Principal Problem:   Gastroenteritis Active Problems:   Pancreatic cancer ([place])   Port-A-Cath in place   Anemia due to antineoplastic chemotherapy   Hypokalemia   Hypomagnesemia   Thrombocytopenia (HCC)   Febrile neutropenia (HCC)   Diarrhea of presumed infectious origin   Admitted From: home Disposition:  home  Recommendations for Outpatient Follow-up:  1. Please follow-up with PCP as well as oncologist recommended.  Follow-up Information    [name], MD. Schedule an appointment as soon as possible for a visit in 1 week(s).   Specialty:  Family Medicine Contact information: [place] [address] [place] [address] [phone]          Diet recommendation: Soft diet  Activity: The patient is advised to gradually reintroduce usual activities.  Discharge Condition: good  Code Status: Full code  History of present illness: As per the H and P dictated on admission, "Morgan Dawson is a 57 y.o. female with Past medical history of  Stage IV Pancreatic cancer with liver mets, chemotherapy with FOLFIRINOX regimen.  Last chemotherapy was on 01/20/2018, cycle 10-day 1.  Patient presented with complaints of nausea and vomiting ongoing since this Monday. Patient reported 4 episodes of vomiting yesterday and 5 episodes of diarrhea. No blood in stool and vomiting. No fever or chills. Also no abdominal pain. Pt was seen in the clinic today as her symptoms are not getting better. Last BM was this AM. No nausea at my evaluation.  Pt was referred for admission from clinic due to low grade fever and neutropenia.  At her baseline ambulates without support And is independent for most of her ADL; manages her medication on her own."  Hospital Course:  Summary of  her active problems in the hospital is as following. 1.Gastroenteritis Febrile neutropenia (HCC) Diarrhea of presumed infectious origin Hypokalemia Hypomagnesemia Pt with nausea and vomiting, also had diarrhea Some fever. Negative C diff, pending GI pathogen panel, the diarrhea resolved. start Imodium Urine culture shows no growth, blood cultures so far negative as well. Initially started on IV vancomycin due to narrowed down to only cefepime. Given the patient's neutropenia has resolved as well as no fever in the 72 hours The patient does not require any further antibiotic on discharge. Patient was able to tolerate oral diet without any nausea or vomiting after discharge.  2.Pancreatic cancer ([place]) Port-A-Cath in place Anemia due to antineoplastic chemotherapy Thrombocytopenia (HCC) 2 PRBC transfusion  Repeat H&H shows appropriate increase. Reticulocyte count is normal suggesting no evidence of acute hemorrhage or acute hematopoiesis.  3.  Chronic kidney disease stage III-IV. Recent serum creatinine has been trending between 1.8 and 1.9, GFR is trending less than 30 meeting stage IV criteria. Although after receiving IV hydration since admission serum creatinine has improved significantly. Continue oral hydration for now.  4 hyperbilirubinemia. Serum bilirubin elevated at 1.4.  LFTs stable no abdominal pain. Monitor for now.  All other chronic medical condition were stable during the hospitalization.  Patient was ambulatory without any assistance. On the day of the discharge the patient's vitals were stable , and no other acute medical condition were reported by patient. the patient was felt safe to be discharge at home with family.  Consultants: none Procedures: none  DISCHARGE MEDICATION: Allergies as  of 01/15/2018   No Known Allergies     Medication List    TAKE these medications   dronabinol 2.5 MG capsule Commonly known as:  MARINOL Take 1  capsule (2.5 mg total) by mouth 2 (two) times daily before a meal.   famotidine 20 MG tablet Commonly known as:  PEPCID Take 1 tablet (20 mg total) by mouth daily.   lidocaine-prilocaine cream Commonly known as:  EMLA Apply to affected area once   loperamide 2 MG capsule Commonly known as:  IMODIUM Take 1 capsule (2 mg total) by mouth as needed for diarrhea or loose stools.   loratadine 10 MG tablet Commonly known as:  CLARITIN Take 10 mg by mouth daily as needed for allergies.   magic mouthwash w/lidocaine Soln Take 10 mLs by mouth 3 (three) times daily as needed for mouth pain. Swish and spit 10 ML by mouth 3 times daily as needed for mouth pain.   meclizine 25 MG tablet Commonly known as:  ANTIVERT Take 25 mg by mouth 3 (three) times daily as needed for dizziness.   ondansetron 8 MG disintegrating tablet Commonly known as:  ZOFRAN-ODT Take 1 tablet (8 mg total) by mouth every 8 (eight) hours as needed for nausea or vomiting.   potassium chloride 10 MEQ tablet Commonly known as:  K-DUR Take 10 mEq by mouth 3 (three) times daily.   prochlorperazine 10 MG tablet Commonly known as:  COMPAZINE Take 1 tablet (10 mg total) by mouth every 6 (six) hours as needed for nausea or vomiting.   traMADol 50 MG tablet Commonly known as:  ULTRAM Take 1 tablet (50 mg total) by mouth every 6 (six) hours as needed. What changed:  reasons to take this      No Known Allergies Discharge Instructions    Diet - low sodium heart healthy   Complete by:  As directed    Discharge instructions   Complete by:  As directed    It is important that you read following instructions as well as go over your medication list with RN to help you understand your care after this hospitalization.  Discharge Instructions: Please follow-up with PCP in one week  Please request your primary care physician to go over all Hospital Tests and Procedure/Radiological results at the follow up,  Please get all  Hospital records sent to your PCP by signing hospital release before you go home.   Do not take more than prescribed Pain, Sleep and Anxiety Medications. You were cared for by a hospitalist during your hospital stay. If you have any questions about your discharge medications or the care you received while you were in the hospital after you are discharged, you can call the unit and ask to speak with the hospitalist on call if the hospitalist that took care of you is not available.  Once you are discharged, your primary care physician will handle any further medical issues. Please note that NO REFILLS for any discharge medications will be authorized once you are discharged, as it is imperative that you return to your primary care physician (or establish a relationship with a primary care physician if you do not have one) for your aftercare needs so that they can reassess your need for medications and monitor your lab values. You Must read complete instructions/literature along with all the possible adverse reactions/side effects for all the Medicines you take and that have been prescribed to you. Take any new Medicines after you have completely understood and  accept all the possible adverse reactions/side effects. Wear Seat belts while driving. If you have smoked or chewed Tobacco in the last 2 yrs please stop smoking and/or stop any Recreational drug use.   Increase activity slowly   Complete by:  As directed      Discharge Exam: Filed Weights   01/12/18 2025 01/13/18 0602 01/15/18 0536  Weight: 47.3 kg (104 lb 4.4 oz) 52.4 kg (115 lb 8.3 oz) 50.3 kg (110 lb 14.4 oz)   Vitals:   01/14/18 2248 01/15/18 0536  BP: 125/82 115/69  Pulse: 86 80  Resp: 17 16  Temp: 98.9 F (37.2 C) 98.3 F (36.8 C)  SpO2: 99% 98%   General: Appear in no distress, no Rash; Oral Mucosa moist. Cardiovascular: S1 and S2 Present, non Murmur, o JVD Respiratory: Bilateral Air entry present and Clear to Auscultation,  no Crackles, no wheezes Abdomen: Bowel Sound present, Soft and no tenderness Extremities: no Pedal edema, no calf tenderness Neurology: Grossly no focal neuro deficit.  The results of significant diagnostics from this hospitalization (including imaging, microbiology, ancillary and laboratory) are listed below for reference.    Significant Diagnostic Studies: X-ray Chest Pa And Lateral  Result Date: 01/13/2018 CLINICAL DATA:  Cough.  Lung crackles. EXAM: CHEST - 2 VIEW COMPARISON:  CT 10/14/2017.  Chest x-ray 08/20/2018. FINDINGS: PowerPort catheter with tip over superior vena cava. Heart size normal. No focal infiltrate. Tiny bilateral pleural effusions. No acute bony abnormality. IMPRESSION: 1.  PowerPort catheter noted with tip in superior vena cava. 2. Tiny bilateral pleural effusions. Exam otherwise unremarkable. No focal pulmonary infiltrate noted. Electronically Signed   By: Marcello Moores  Register   On: 01/13/2018 10:52   Nm Pet Image Restag (ps) Skull Base To Thigh  Result Date: 12/17/2017 CLINICAL DATA:  Subsequent treatment strategy for metastatic pancreatic cancer diagnosed 07/23/2017. EXAM: NUCLEAR MEDICINE PET SKULL BASE TO THIGH TECHNIQUE: 5.64 mCi F-18 FDG was injected intravenously. Full-ring PET imaging was performed from the skull base to thigh after the radiotracer. CT data was obtained and used for attenuation correction and anatomic localization. Fasting blood glucose: 108 mg/dl COMPARISON:  PET-CT 07/29/2017. Chest, abdomen and pelvic CT 10/14/2017. FINDINGS: Mediastinal blood pool activity: SUV max 2.0 NECK: No hypermetabolic cervical lymph nodes are identified.There are no lesions of the pharyngeal mucosal space. Incidental CT findings: none CHEST: There are no hypermetabolic mediastinal, hilar or axillary lymph nodes. There is no hypermetabolic pulmonary activity. Incidental CT findings: Stable tiny calcified right upper lobe granuloma. There is mild atelectasis at both lung bases.  Right IJ Port-A-Cath extends to the superior cavoatrial junction. ABDOMEN/PELVIS: Compared with the prior studies, the extensive hepatic metastatic disease has decreased in size. There is no residual hypermetabolic activity associated with any of the lesions. Index lesion in the left hepatic lobe measures 3.6 x 4.8 cm on image 104/4 (8.3 x 6.0 cm on prior PET-CT). No residual abnormal activity within the pancreas. The spleen kidneys appear unremarkable. There is no hypermetabolic nodal activity in the abdomen or pelvis. Incidental CT findings: Cholelithiasis. SKELETON: There is no hypermetabolic activity to suggest osseous metastatic disease. Generalized marrow activity attributed to interval therapy. Incidental CT findings: Chronic bilateral L5 pars defects. IMPRESSION: 1. Although the liver metastases are still visible on the CT images, they are significantly smaller and retain no abnormal metabolic activity, consistent with response to therapy. 2. No abnormal activity within the pancreas. 3. No disease progression identified. 4. Cholelithiasis. Electronically Signed   By: Caryl Comes.D.  On: 12/17/2017 14:16    Microbiology: Recent Results (from the past 240 hour(s))  Culture, Blood     Status: None (Preliminary result)   Collection Time: 01/12/18 10:59 AM  Result Value Ref Range Status   Specimen Description BLOOD PORTA CATH  Final   Special Requests   Final    BOTTLES DRAWN AEROBIC AND ANAEROBIC Blood Culture adequate volume   Culture   Final    NO GROWTH 3 DAYS Performed at [facility], [address], [place], [place] [address]    Report Status PENDING  Incomplete  Culture, Urine     Status: None   Collection Time: 01/12/18 11:00 AM  Result Value Ref Range Status   Specimen Description   Final    URINE, CLEAN CATCH Performed at [facility], [place] [address] [place], [place] [address]    Special Requests   Final    NONE Performed at [facility], [place] [address] [place], [place] [address]    Culture   Final    NO GROWTH Performed at [facility], [place] [address] [place], [place] [address]    Report Status 01/13/2018 FINAL  Final  Culture, Blood     Status: None (Preliminary result)   Collection Time: 01/12/18 11:25 AM  Result Value Ref Range Status   Specimen Description BLOOD LEFT ANTECUBITAL  Final   Special Requests   Final    BOTTLES DRAWN AEROBIC AND ANAEROBIC Blood Culture adequate volume   Culture   Final    NO GROWTH 3 DAYS Performed at [facility], [place] [address], [place], [place] [address]    Report Status PENDING  Incomplete  C difficile quick scan w PCR reflex     Status: None   Collection Time: 01/13/18  8:00 AM  Result Value Ref Range Status   C Diff antigen NEGATIVE NEGATIVE Final   C Diff toxin NEGATIVE NEGATIVE Final   C Diff interpretation No C. difficile detected.  Final    Comment: Performed at [facility], [place] [address], [place],  [address]     Labs: CBC: Recent Labs  Lab 01/12/18 1130 01/13/18 0352 01/13/18 0500 01/14/18 0847  WBC 0.9* 3.5*  --  9.8  NEUTROABS 0.3* 1.5  --  7.1  HGB 6.6* 9.0* 9.1* 9.7*  HCT 19.3* 25.8* 25.8* 28.2*  MCV 102.0* 94.2  --  95.3  PLT 31* 24*  --  39*   Basic Metabolic Panel: Recent Labs  Lab 01/12/18 1130 01/13/18 0500 01/14/18 0453 01/14/18 0847 01/15/18 0900  NA 134* 136  --  137 135  K 2.9* 2.9*  --  2.8* 3.5  CL 99 104  --  107 106  CO2 25 21*  --  20* 19*  GLUCOSE 116 104*  --  99 90  BUN 21 19  --  14 13  CREATININE 1.61* 1.39*  1.35* 1.32* 1.31* 1.24*  CALCIUM 8.1* 7.6*  --  8.2* 8.4*  MG 0.8* 2.1  --   --   --   PHOS  --  2.0*  --   --   --    Liver Function Tests: Recent Labs  Lab 01/12/18 1130 01/13/18 0500  AST 49* 39  ALT 44 35  ALKPHOS 290* 200*  BILITOT 1.1 1.4*  PROT 6.8 6.1*  ALBUMIN 3.2* 2.9*   No results for input(s): LIPASE, AMYLASE in the last 168  hours.  No results for input(s): AMMONIA in the last 168 hours. Cardiac Enzymes: No results for input(s): CKTOTAL, CKMB, CKMBINDEX, TROPONINI in the last 168 hours. BNP (last 3 results) No results for input(s): BNP in the last 8760 hours. CBG: No results for input(s): GLUCAP in the last 168 hours. Time spent: 35 minutes  Signed:  Berle Mull  [facility] 01/15/2018 , 8:16 AM

## 2018-01-17 LAB — CULTURE, BLOOD (SINGLE)
CULTURE: NO GROWTH
CULTURE: NO GROWTH
Special Requests: ADEQUATE
Special Requests: ADEQUATE

## 2018-01-18 ENCOUNTER — Inpatient Hospital Stay: Payer: 59 | Admitting: Hematology

## 2018-01-18 ENCOUNTER — Inpatient Hospital Stay: Payer: 59

## 2018-01-18 ENCOUNTER — Other Ambulatory Visit: Payer: 59

## 2018-01-19 ENCOUNTER — Telehealth: Payer: Self-pay

## 2018-01-19 NOTE — Telephone Encounter (Signed)
Patient calls stating she was supposed to have her chemo treatments rescheduled.  Had one today that was cancelled, and has one on 6/10 does that need to be cancelled and rescheduled for the week of 6/17.  Her [phone]

## 2018-01-21 ENCOUNTER — Inpatient Hospital Stay (HOSPITAL_BASED_OUTPATIENT_CLINIC_OR_DEPARTMENT_OTHER): Payer: 59 | Admitting: Nurse Practitioner

## 2018-01-21 ENCOUNTER — Telehealth: Payer: Self-pay | Admitting: Nurse Practitioner

## 2018-01-21 ENCOUNTER — Telehealth: Payer: Self-pay | Admitting: Hematology

## 2018-01-21 ENCOUNTER — Inpatient Hospital Stay: Payer: 59

## 2018-01-21 ENCOUNTER — Encounter: Payer: Self-pay | Admitting: Nurse Practitioner

## 2018-01-21 VITALS — BP 121/81 | HR 87 | Temp 98.6°F | Resp 18 | Ht 64.96 in | Wt 104.7 lb

## 2018-01-21 DIAGNOSIS — H8109 Meniere's disease, unspecified ear: Secondary | ICD-10-CM

## 2018-01-21 DIAGNOSIS — R197 Diarrhea, unspecified: Secondary | ICD-10-CM | POA: Diagnosis not present

## 2018-01-21 DIAGNOSIS — G620 Drug-induced polyneuropathy: Secondary | ICD-10-CM

## 2018-01-21 DIAGNOSIS — C787 Secondary malignant neoplasm of liver and intrahepatic bile duct: Secondary | ICD-10-CM | POA: Diagnosis not present

## 2018-01-21 DIAGNOSIS — Z95828 Presence of other vascular implants and grafts: Secondary | ICD-10-CM

## 2018-01-21 DIAGNOSIS — T451X5A Adverse effect of antineoplastic and immunosuppressive drugs, initial encounter: Secondary | ICD-10-CM | POA: Diagnosis not present

## 2018-01-21 DIAGNOSIS — E876 Hypokalemia: Secondary | ICD-10-CM

## 2018-01-21 DIAGNOSIS — R740 Nonspecific elevation of levels of transaminase and lactic acid dehydrogenase [LDH]: Secondary | ICD-10-CM | POA: Diagnosis not present

## 2018-01-21 DIAGNOSIS — C259 Malignant neoplasm of pancreas, unspecified: Secondary | ICD-10-CM

## 2018-01-21 DIAGNOSIS — R110 Nausea: Secondary | ICD-10-CM

## 2018-01-21 DIAGNOSIS — C251 Malignant neoplasm of body of pancreas: Secondary | ICD-10-CM

## 2018-01-21 DIAGNOSIS — R112 Nausea with vomiting, unspecified: Secondary | ICD-10-CM

## 2018-01-21 DIAGNOSIS — N183 Chronic kidney disease, stage 3 (moderate): Secondary | ICD-10-CM

## 2018-01-21 LAB — CBC WITH DIFFERENTIAL/PLATELET
BASOS ABS: 0 10*3/uL (ref 0.0–0.1)
BASOS PCT: 0 %
EOS ABS: 0 10*3/uL (ref 0.0–0.5)
EOS PCT: 1 %
HCT: 30.7 % — ABNORMAL LOW (ref 34.8–46.6)
HEMOGLOBIN: 10.5 g/dL — AB (ref 11.6–15.9)
LYMPHS ABS: 0.8 10*3/uL — AB (ref 0.9–3.3)
Lymphocytes Relative: 9 %
MCH: 33.8 pg (ref 25.1–34.0)
MCHC: 34.3 g/dL (ref 31.5–36.0)
MCV: 98.4 fL (ref 79.5–101.0)
Monocytes Absolute: 0.9 10*3/uL (ref 0.1–0.9)
Monocytes Relative: 10 %
NEUTROS PCT: 80 %
Neutro Abs: 7.1 10*3/uL — ABNORMAL HIGH (ref 1.5–6.5)
PLATELETS: 149 10*3/uL (ref 145–400)
RBC: 3.12 MIL/uL — AB (ref 3.70–5.45)
RDW: 21.2 % — ABNORMAL HIGH (ref 11.2–14.5)
WBC: 8.8 10*3/uL (ref 3.9–10.3)

## 2018-01-21 LAB — COMPREHENSIVE METABOLIC PANEL
ALT: 99 U/L — AB (ref 0–55)
AST: 125 U/L — AB (ref 5–34)
Albumin: 3.2 g/dL — ABNORMAL LOW (ref 3.5–5.0)
Alkaline Phosphatase: 331 U/L — ABNORMAL HIGH (ref 40–150)
Anion gap: 10 (ref 3–11)
BILIRUBIN TOTAL: 0.8 mg/dL (ref 0.2–1.2)
BUN: 10 mg/dL (ref 7–26)
CALCIUM: 8.3 mg/dL — AB (ref 8.4–10.4)
CHLORIDE: 103 mmol/L (ref 98–109)
CO2: 26 mmol/L (ref 22–29)
CREATININE: 1.07 mg/dL (ref 0.60–1.10)
GFR calc Af Amer: >60 mL/min (ref >60)
GFR, EST NON AFRICAN AMERICAN: 57 mL/min — AB (ref >60)
Glucose, Bld: 106 mg/dL (ref 70–140)
Potassium: 3.5 mmol/L (ref 3.5–5.1)
Sodium: 139 mmol/L (ref 136–145)
TOTAL PROTEIN: 6.8 g/dL (ref 6.4–8.3)

## 2018-01-21 LAB — LIPASE, BLOOD: LIPASE: 55 U/L — AB (ref 11–51)

## 2018-01-21 MED ORDER — SODIUM CHLORIDE 0.9 % IJ SOLN
10.0000 mL | Freq: Once | INTRAMUSCULAR | Status: AC
  Administered 2018-01-21: 10 mL
  Filled 2018-01-21: qty 10

## 2018-01-21 MED ORDER — HEPARIN SOD (PORK) LOCK FLUSH 100 UNIT/ML IV SOLN
500.0000 [IU] | Freq: Once | INTRAVENOUS | Status: AC
  Administered 2018-01-21: 500 [IU]
  Filled 2018-01-21: qty 5

## 2018-01-21 NOTE — Telephone Encounter (Signed)
No LOS 5/31

## 2018-01-21 NOTE — Progress Notes (Signed)
[place]  [phone] [phone]  Clinic Follow up Note   Patient Care Team: [name], MD as PCP - General (Family Medicine) [name], MD as Consulting Physician (Otolaryngology) 01/21/2018  SUMMARY OF ONCOLOGIC HISTORY: Oncology History   Cancer Staging Pancreatic cancer [facility]) Staging form: Exocrine Pancreas, AJCC 8th Edition - Clinical stage from 08/06/2017: Stage IV (cTX, cN0, pM1) - Signed by [name], MD on 08/12/2017       Pancreatic cancer ([place])   07/23/2017 Imaging    US abdomen limited RUQ 07/23/17 IMPRESSION: 1. Cholelithiasis.  No secondary signs of acute cholecystitis. 2. Multiple solid liver masses measuring up to 6.5 cm in the left lobe of the liver, evaluation for metastatic disease is recommended. These results will be called to the ordering clinician or representative by the Radiologist Assistant, and communication documented in the PACS or zVision Dashboard.      07/23/2017 Imaging    CT Abdomen W Contrast 07/23/17 IMPRESSION: 1. Widespread metastatic disease throughout the liver. No clear primary malignancy identified in the abdomen. The pelvis was not imaged. Tissue sampling recommended. 2. Probable adenopathy superior to the pancreatic tail. No evidence of pancreatic mass. 3. Suspected incidental hemangioma inferiorly in the right hepatic lobe. 4. Nonspecific nodularity in the breasts. The patient has undergone recent (03/25/2017 and 04/01/2017) mammography and ultrasound.      07/29/2017 Initial Diagnosis    Metastasis to liver of unknown origin ([place])      07/29/2017 PET scan    PET 07/29/17  IMPRESSION: 1. Numerous bulky liver masses are hypermetabolic compatible with malignancy. 2. Accentuated activity within or along the pancreatic tail, likely represent a primary pancreatic tumor. Consider pancreatic protocol MRI to further work this up. 3. The peripancreatic lymph node shown above the  pancreatic tail is mildly hypermetabolic favoring malignancy. 4.  Prominent stool throughout the colon favors constipation. 5. 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      08/05/2017 Pathology Results    Liver Biopsy  Diagnosis 08/05/17 Liver, needle/core biopsy - CARCINOMA. - SEE COMMENT. Microscopic Comment The malignant cells are positive for cytokeratin 7. They are negative for arginase, CDX2, cytokeratin 20, estrogen receptor, GATA-3, GCDFP, Glypican 3, Hep Par 1, Napsin A, and TTF-1. This immunohistochemical is nonspecific. Possibly primary sources include pancreatobiliary and upper gastrointestinal. Radiologic correlation is necessary. Of note, organ specific markers (GATA-3, GCDFP-breast, TTF-1, Napsin A-lung, and CDX2-colon) are negative. (JBK:ecj 08/09/2017)      08/21/2017 -  Chemotherapy    FOLFIRINOX every 2 weeks starting 08/21/17        10/14/2017 Imaging    CT CAP WO Contrast 10/14/17 IMPRESSION: Evidence of known numerous liver metastases without significant interval change. No other evidence of metastatic disease within the chest, abdomen or pelvis. Cholelithiasis. Tiny pericardial effusion.      12/02/2017 Genetic Testing    Negative for pathogenic mutation.  The genes analyzed were the 83 genes on [facility]'s Multi-Cancer panel (ALK, APC, ATM, AXIN2, BAP1, BARD1, BLM, BMPR1A, BRCA1, BRCA2, BRIP1, CASR, CDC73, CDH1, CDK4, CDKN1B, CDKN1C, CDKN2A, CEBPA, CHEK2, CTNNA1, DICER1, DIS3L2, EGFR, EPCAM, FH, FLCN, GATA2, GPC3, GREM1, HOXB13, HRAS, KIT, MAX, MEN1, MET, MITF, MLH1, MSH2, MSH3, MSH6, MUTYH, NBN, NF1, NF2, NTHL1, PALB2, PDGFRA, PHOX2B, PMS2, POLD1, POLE, POT1, PRKAR1A, PTCH1, PTEN, RAD50, RAD51C, RAD51D, RB1, RECQL4, RET, RUNX1, SDHA, SDHAF2, SDHB, SDHC, SDHD, SMAD4, SMARCA4, SMARCB1, SMARCE1, STK11, SUFU, TERC, TERT, TMEM127, TP53, TSC1, TSC2, VHL, WRN, WT1).      12/17/2017 PET scan  IMPRESSION: 1. Although the liver metastases are still visible on the CT  images, they are significantly smaller and retain no abnormal metabolic activity, consistent with response to therapy. 2. No abnormal activity within the pancreas. 3. No disease progression identified. 4. Cholelithiasis.     CURRENT THERAPY:mFOLFIRINOX every 2 weeks started 08/21/17   INTERVAL HISTORY: Morgan Dawson presents for hospital follow-up as scheduled.  She completed cycle 10 FOLFIRINOX on 01/03/2018, she required symptom management visit with Sandi Mealy, PA on 01/12/2018 for nausea and vomiting, diarrhea, and fever.  Labs obtained in clinic revealed pancytopenia, ANC 0.3, platelets 31K, Hgb 6.6.  She was admitted for further work-up.  While hospitalized she received electrolyte replacements, broad-spectrum antibiotics, and RBC transfusion x2.  GI panel, C. difficile, urine, and blood cultures all negative.  She was discharged home on 5/22 and has been recovering well since then.  She continues to have watery stool when she does not take Imodium, otherwise no diarrhea if she takes 1 tablet daily.  No nausea or vomiting since hospital discharge.  Remained afebrile.  Appetite and energy level fluctuate.  She drinks a boost/Ensure if she has low p.o. intake that day.  Marinol has still not been approved.  She has some pain in her tailbone she attributes to sitting in a hospital bed.  REVIEW OF SYSTEMS:   Constitutional: Denies fevers, chills or abnormal weight loss (+) afebrile since hospitalization (+) appetite fluctuates Eyes: Denies blurriness of vision Ears, nose, mouth, throat, and face: Denies mucositis or sore throat Respiratory: Denies cough, dyspnea or wheezes Cardiovascular: Denies palpitation, chest discomfort or lower extremity swelling Gastrointestinal:  Denies nausea, vomiting, constipation, heartburn or change in bowel habits (+) intermittent diarrhea, improving; controlled with imodium   Skin: Denies abnormal skin rashes Lymphatics: Denies new lymphadenopathy or easy  bruising Neurological:Denies or new weaknesses (+) neuropathy  Behavioral/Psych: Mood is stable, no new changes  MSK: (+) pain at coccyx  All other systems were reviewed with the patient and are negative.  MEDICAL HISTORY:  Past Medical History:  Diagnosis Date  . Genetic testing 12/02/2017   Multi-Cancer panel (83 genes) @ [facility] - No pathogenic mutations detected  . Meniere disease 2016  . Pancreatitis     SURGICAL HISTORY: Past Surgical History:  Procedure Laterality Date  . CERVICAL ABLATION    . ENDOMETRIAL ABLATION  2011  . IR FLUORO GUIDE PORT INSERTION RIGHT  08/12/2017  . IR US GUIDE VASC ACCESS RIGHT  08/12/2017  . [place]    I have reviewed the social history and family history with the patient and they are unchanged from previous note.  ALLERGIES:  has No Known Allergies.  MEDICATIONS:  Current Outpatient Medications  Medication Sig Dispense Refill  . dronabinol (MARINOL) 2.5 MG capsule Take 1 capsule (2.5 mg total) by mouth 2 (two) times daily before a meal. 40 capsule 0  . famotidine (PEPCID) 20 MG tablet Take 1 tablet (20 mg total) by mouth daily. 30 tablet 0  . lidocaine-prilocaine (EMLA) cream Apply to affected area once 30 g 3  . loperamide (IMODIUM) 2 MG capsule Take 1 capsule (2 mg total) by mouth as needed for diarrhea or loose stools. 30 capsule 0  . loratadine (CLARITIN) 10 MG tablet Take 10 mg by mouth daily as needed for allergies.    . magic mouthwash w/lidocaine SOLN Take 10 mLs by mouth 3 (three) times daily as needed for mouth pain. Swish and spit 10 ML by mouth 3 times  daily as needed for mouth pain. 240 mL 0  . meclizine (ANTIVERT) 25 MG tablet Take 25 mg by mouth 3 (three) times daily as needed for dizziness.    . ondansetron (ZOFRAN-ODT) 8 MG disintegrating tablet Take 1 tablet (8 mg total) by mouth every 8 (eight) hours as needed for nausea or vomiting. 40 tablet 2  . potassium chloride (K-DUR) 10 MEQ tablet Take 10 mEq by  mouth 3 (three) times daily.  2  . prochlorperazine (COMPAZINE) 10 MG tablet Take 1 tablet (10 mg total) by mouth every 6 (six) hours as needed for nausea or vomiting. 40 tablet 2  . traMADol (ULTRAM) 50 MG tablet Take 1 tablet (50 mg total) by mouth every 6 (six) hours as needed. (Patient taking differently: Take 50 mg by mouth every 6 (six) hours as needed for moderate pain. ) 30 tablet 0   No current facility-administered medications for this visit.     PHYSICAL EXAMINATION: ECOG PERFORMANCE STATUS: 1 - Symptomatic but completely ambulatory BP 121/81 (BP Location: Left Arm, Patient Position: Sitting)   Pulse 87   Temp 98.6 F (37 C) (Oral)   Resp 18   Ht 5' 4.96" (1.65 m)   Wt 104 lb 11.2 oz (47.5 kg)   LMP 10/23/2015   SpO2 99%   BMI 17.44 kg/m   GENERAL:alert, no distress and comfortable SKIN: no rashes or significant lesions EYES: normal, Conjunctiva are pink and non-injected, sclera clear OROPHARYNX:no thrush or ulcers   LYMPH:  no palpable cervical or supraclavicular lymphadenopathy LUNGS: clear to auscultation with normal breathing effort HEART: regular rate & rhythm and no murmurs and no lower extremity edema ABDOMEN:abdomen soft, non-tender and normal bowel sounds. No hepatomegaly  Musculoskeletal:no cyanosis of digits and no clubbing  NEURO: alert & oriented x 3 with fluent speech, no focal motor/sensory deficits PAC without erythema    LABORATORY DATA:  I have reviewed the data as listed CBC Latest Ref Rng & Units 01/21/2018 01/14/2018 01/13/2018  WBC 3.9 - 10.3 K/uL 8.8 9.8 -  Hemoglobin 11.6 - 15.9 g/dL 10.5(L) 9.7(L) 9.1(L)  Hematocrit 34.8 - 46.6 % 30.7(L) 28.2(L) 25.8(L)  Platelets 145 - 400 K/uL 149 39(L) -     CMP Latest Ref Rng & Units 01/21/2018 01/15/2018 01/14/2018  Glucose 70 - 140 mg/dL 106 90 99  BUN 7 - 26 mg/dL _0 Creatinine 0.60 - 1.10 mg/dL 1.07 1.24(H) 1.31(H)  Sodium 136 - 145 mmol/L 139 135 137  Potassium 3.5 - 5.1 mmol/L 3.5 3.5  2.8(L)  Chloride 98 - 109 mmol/L 103 106 107  CO2 22 - 29 mmol/L 26 19(L) 20(L)  Calcium 8.4 - 10.4 mg/dL 8.3(L) 8.4(L) 8.2(L)  Total Protein 6.4 - 8.3 g/dL 6.8 - -  Total Bilirubin 0.2 - 1.2 mg/dL 0.8 - -  Alkaline Phos 40 - 150 U/L 331(H) - -  AST 5 - 34 U/L 125(H) - -  ALT 0 - 55 U/L 99(H) - -   PATHOLOGY  Liver Biopsy  Diagnosis 08/05/17 Liver, needle/core biopsy - CARCINOMA. - SEE COMMENT. Microscopic Comment The malignant cells are positive for cytokeratin 7. They are negative for arginase, CDX2, cytokeratin 20, estrogen receptor, GATA-3, GCDFP, Glypican 3, Hep Par 1, Napsin A, and TTF-1. This immunohistochemical is nonspecific. Possibly primary sources include pancreatobiliary and upper gastrointestinal. Radiologic correlation is necessary. Of note, organ specific markers (GATA-3, GCDFP-breast, TTF-1, Napsin A-lung, and CDX2-colon) are negative. (JBK:ecj 08/09/2017)   RADIOGRAPHIC STUDIES: I have personally reviewed the  radiological images as listed and agreed with the findings in the report. No results found.   ASSESSMENT & PLAN: Morgan Dawson a 57 y.o.caucasian female with a history of vertigo and menieres disease, presented with epigastric discomfort, low appetite and 5 pound weight loss   1. 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 Pancreatitis, likely secondary to #1 3. Nausea and diarrhea 4. Anorexia and weight loss  5. Hypercalcemia, seoncdary to #1 6. Transaminitis  7. Goals of care discussion  8. Hypokalemia, CKD stage III 9. Meniere's disease 10. Genetics, negative  11. Peripheral neuropathy, secondary to chemotherapy, G1  Morgan Dawson appears stable. She completed cycle 10 FOLFIRINOX on 01/03/18, she developed nausea/vomiting, diarrhea, and neutropenic fever. She was admitted 5/22 - 5/25, work up was negative, symptoms attributed to chemotherapy. She is recovering well. Diarrhea is controlled with 1 imodium daily. Weight is  stable. I encouraged her to eat well and hydrate to gain strength. She has developed mild neuropathy, without sensory deficit or functional difficulties.   Labs reviewed; CBC is improved. Cr and K are normal. LFTs increased today; bili was elevated during hospitalization but normal today. Will continue to monitor closely. Labs are adequate to resume chemotherapy, plan to restart FOLFIRINOX next week with dose-reductions. F/u in 3 weeks with next cycle.   PLAN Labs reviewed, resume FOLFIRINOX (cycle 11) in 1 week with dose-reductions  F/u in 3 weeks with cycle 12  All questions were answered. The patient knows to call the clinic with any problems, questions or concerns. No barriers to learning was detected.     Alla Feeling, NP 01/21/18

## 2018-01-21 NOTE — Telephone Encounter (Signed)
Scheduled appt per 5/31 sch msg - spoke w/ pt re appts.

## 2018-01-22 LAB — CANCER ANTIGEN 19-9: CAN 19-9: 32 U/mL (ref 0–35)

## 2018-01-24 ENCOUNTER — Other Ambulatory Visit: Payer: Self-pay | Admitting: Hematology

## 2018-01-24 ENCOUNTER — Inpatient Hospital Stay: Payer: 59 | Attending: Hematology

## 2018-01-24 ENCOUNTER — Inpatient Hospital Stay: Payer: 59

## 2018-01-24 DIAGNOSIS — Z5189 Encounter for other specified aftercare: Secondary | ICD-10-CM | POA: Insufficient documentation

## 2018-01-24 DIAGNOSIS — Z7189 Other specified counseling: Secondary | ICD-10-CM

## 2018-01-24 DIAGNOSIS — C787 Secondary malignant neoplasm of liver and intrahepatic bile duct: Secondary | ICD-10-CM | POA: Diagnosis not present

## 2018-01-24 DIAGNOSIS — C251 Malignant neoplasm of body of pancreas: Secondary | ICD-10-CM

## 2018-01-24 DIAGNOSIS — C259 Malignant neoplasm of pancreas, unspecified: Secondary | ICD-10-CM | POA: Diagnosis not present

## 2018-01-24 DIAGNOSIS — Z5111 Encounter for antineoplastic chemotherapy: Secondary | ICD-10-CM | POA: Diagnosis not present

## 2018-01-24 LAB — COMPREHENSIVE METABOLIC PANEL
ALT: 104 U/L — ABNORMAL HIGH (ref 0–55)
ANION GAP: 10 (ref 3–11)
AST: 134 U/L — ABNORMAL HIGH (ref 5–34)
Albumin: 3.1 g/dL — ABNORMAL LOW (ref 3.5–5.0)
Alkaline Phosphatase: 354 U/L — ABNORMAL HIGH (ref 40–150)
BILIRUBIN TOTAL: 0.7 mg/dL (ref 0.2–1.2)
BUN: 10 mg/dL (ref 7–26)
CHLORIDE: 105 mmol/L (ref 98–109)
CO2: 26 mmol/L (ref 22–29)
Calcium: 8.4 mg/dL (ref 8.4–10.4)
Creatinine, Ser: 1.19 mg/dL — ABNORMAL HIGH (ref 0.60–1.10)
GFR, EST AFRICAN AMERICAN: 58 mL/min — AB (ref >60)
GFR, EST NON AFRICAN AMERICAN: 50 mL/min — AB (ref >60)
Glucose, Bld: 105 mg/dL (ref 70–140)
POTASSIUM: 3.4 mmol/L — AB (ref 3.5–5.1)
Sodium: 141 mmol/L (ref 136–145)
TOTAL PROTEIN: 6.6 g/dL (ref 6.4–8.3)

## 2018-01-24 LAB — CBC WITH DIFFERENTIAL/PLATELET
BASOS ABS: 0 10*3/uL (ref 0.0–0.1)
Basophils Relative: 0 %
EOS PCT: 1 %
Eosinophils Absolute: 0 10*3/uL (ref 0.0–0.5)
HEMATOCRIT: 29.3 % — AB (ref 34.8–46.6)
HEMOGLOBIN: 10.1 g/dL — AB (ref 11.6–15.9)
LYMPHS PCT: 16 %
Lymphs Abs: 0.7 10*3/uL — ABNORMAL LOW (ref 0.9–3.3)
MCH: 34.2 pg — ABNORMAL HIGH (ref 25.1–34.0)
MCHC: 34.4 g/dL (ref 31.5–36.0)
MCV: 99.5 fL (ref 79.5–101.0)
Monocytes Absolute: 0.8 10*3/uL (ref 0.1–0.9)
Monocytes Relative: 16 %
NEUTROS ABS: 3.2 10*3/uL (ref 1.5–6.5)
NEUTROS PCT: 67 %
PLATELETS: 131 10*3/uL — AB (ref 145–400)
RBC: 2.95 MIL/uL — AB (ref 3.70–5.45)
RDW: 20.9 % — ABNORMAL HIGH (ref 11.2–14.5)
WBC: 4.8 10*3/uL (ref 3.9–10.3)

## 2018-01-24 MED ORDER — ATROPINE SULFATE 1 MG/ML IJ SOLN
0.5000 mg | Freq: Once | INTRAMUSCULAR | Status: AC | PRN
  Administered 2018-01-24: 0.5 mg via INTRAVENOUS

## 2018-01-24 MED ORDER — DEXTROSE 5 % IV SOLN
Freq: Once | INTRAVENOUS | Status: AC
  Administered 2018-01-24: 09:00:00 via INTRAVENOUS

## 2018-01-24 MED ORDER — OXALIPLATIN CHEMO INJECTION 100 MG/20ML
60.0000 mg/m2 | Freq: Once | INTRAVENOUS | Status: AC
  Administered 2018-01-24: 90 mg via INTRAVENOUS
  Filled 2018-01-24: qty 18

## 2018-01-24 MED ORDER — DEXTROSE 5 % IV SOLN
120.0000 mg/m2 | Freq: Once | INTRAVENOUS | Status: AC
  Administered 2018-01-24: 180 mg via INTRAVENOUS
  Filled 2018-01-24: qty 9

## 2018-01-24 MED ORDER — PALONOSETRON HCL INJECTION 0.25 MG/5ML
INTRAVENOUS | Status: AC
  Filled 2018-01-24: qty 5

## 2018-01-24 MED ORDER — SODIUM CHLORIDE 0.9 % IV SOLN
2000.0000 mg/m2 | INTRAVENOUS | Status: DC
  Administered 2018-01-24: 2950 mg via INTRAVENOUS
  Filled 2018-01-24: qty 59

## 2018-01-24 MED ORDER — DEXTROSE 5 % IV SOLN
400.0000 mg/m2 | Freq: Once | INTRAVENOUS | Status: AC
  Administered 2018-01-24: 592 mg via INTRAVENOUS
  Filled 2018-01-24: qty 29.6

## 2018-01-24 MED ORDER — ATROPINE SULFATE 1 MG/ML IJ SOLN
INTRAMUSCULAR | Status: AC
  Filled 2018-01-24: qty 1

## 2018-01-24 MED ORDER — FOSAPREPITANT DIMEGLUMINE INJECTION 150 MG
Freq: Once | INTRAVENOUS | Status: AC
  Administered 2018-01-24: 10:00:00 via INTRAVENOUS
  Filled 2018-01-24: qty 5

## 2018-01-24 MED ORDER — PALONOSETRON HCL INJECTION 0.25 MG/5ML
0.2500 mg | Freq: Once | INTRAVENOUS | Status: AC
  Administered 2018-01-24: 0.25 mg via INTRAVENOUS

## 2018-01-24 NOTE — Progress Notes (Signed)
Per Dr. Burr Medico okay to treat pt with  ALT of 104 and AST of 134

## 2018-01-24 NOTE — Patient Instructions (Signed)
Implanted Port Home Guide An implanted port is a type of central line that is placed under the skin. Central lines are used to provide IV access when treatment or nutrition needs to be given through a person's veins. Implanted ports are used for long-term IV access. An implanted port may be placed because:  You need IV medicine that would be irritating to the small veins in your hands or arms.  You need long-term IV medicines, such as antibiotics.  You need IV nutrition for a long period.  You need frequent blood draws for lab tests.  You need dialysis.  Implanted ports are usually placed in the chest area, but they can also be placed in the upper arm, the abdomen, or the leg. An implanted port has two main parts:  Reservoir. The reservoir is round and will appear as a small, raised area under your skin. The reservoir is the part where a needle is inserted to give medicines or draw blood.  Catheter. The catheter is a thin, flexible tube that extends from the reservoir. The catheter is placed into a large vein. Medicine that is inserted into the reservoir goes into the catheter and then into the vein.  How will I care for my incision site? Do not get the incision site wet. Bathe or shower as directed by your health care provider. How is my port accessed? Special steps must be taken to access the port:  Before the port is accessed, a numbing cream can be placed on the skin. This helps numb the skin over the port site.  Your health care provider uses a sterile technique to access the port. ? Your health care provider must put on a mask and sterile gloves. ? The skin over your port is cleaned carefully with an antiseptic and allowed to dry. ? The port is gently pinched between sterile gloves, and a needle is inserted into the port.  Only "non-coring" port needles should be used to access the port. Once the port is accessed, a blood return should be checked. This helps ensure that the port  is in the vein and is not clogged.  If your port needs to remain accessed for a constant infusion, a clear (transparent) bandage will be placed over the needle site. The bandage and needle will need to be changed every week, or as directed by your health care provider.  Keep the bandage covering the needle clean and dry. Do not get it wet. Follow your health care provider's instructions on how to take a shower or bath while the port is accessed.  If your port does not need to stay accessed, no bandage is needed over the port.  What is flushing? Flushing helps keep the port from getting clogged. Follow your health care provider's instructions on how and when to flush the port. Ports are usually flushed with saline solution or a medicine called heparin. The need for flushing will depend on how the port is used.  If the port is used for intermittent medicines or blood draws, the port will need to be flushed: ? After medicines have been given. ? After blood has been drawn. ? As part of routine maintenance.  If a constant infusion is running, the port may not need to be flushed.  How long will my port stay implanted? The port can stay in for as long as your health care provider thinks it is needed. When it is time for the port to come out, surgery will be   done to remove it. The procedure is similar to the one performed when the port was put in. When should I seek immediate medical care? When you have an implanted port, you should seek immediate medical care if:  You notice a bad smell coming from the incision site.  You have swelling, redness, or drainage at the incision site.  You have more swelling or pain at the port site or the surrounding area.  You have a fever that is not controlled with medicine.  This information is not intended to replace advice given to you by your health care provider. Make sure you discuss any questions you have with your health care provider. Document  Released: 08/10/2005 Document Revised: 01/16/2016 Document Reviewed: 04/17/2013 Elsevier Interactive Patient Education  2017 Elsevier Inc.  

## 2018-01-24 NOTE — Patient Instructions (Signed)
[facility] Discharge Instructions for Patients Receiving Chemotherapy  Today you received the following chemotherapy agents Oxaliplatin, Leucovorin, Irinotecan, and 5FU  To help prevent nausea and vomiting after your treatment, we encourage you to take your nausea medication as directed   If you develop nausea and vomiting that is not controlled by your nausea medication, call the clinic.   BELOW ARE SYMPTOMS THAT SHOULD BE REPORTED IMMEDIATELY:  *FEVER GREATER THAN 100.5 F  *CHILLS WITH OR WITHOUT FEVER  NAUSEA AND VOMITING THAT IS NOT CONTROLLED WITH YOUR NAUSEA MEDICATION  *UNUSUAL SHORTNESS OF BREATH  *UNUSUAL BRUISING OR BLEEDING  TENDERNESS IN MOUTH AND THROAT WITH OR WITHOUT PRESENCE OF ULCERS  *URINARY PROBLEMS  *BOWEL PROBLEMS  UNUSUAL RASH Items with * indicate a potential emergency and should be followed up as soon as possible.  Feel free to call the clinic should you have any questions or concerns. The clinic phone number is [phone].  Please show the CHEMO ALERT CARD at check-in to the Emergency Department and triage nurse.   

## 2018-01-24 NOTE — Progress Notes (Signed)
Prior auth for Dronabinol 2.5 mg has been submitted. Status is pending.

## 2018-01-24 NOTE — Progress Notes (Signed)
Ok'd by Cira Rue, NP to use new BSA for today's chemo doses.  Pt has lost weight. Kennith Center, Pharm.D., CPP 01/24/2018@9 :40 AM

## 2018-01-26 ENCOUNTER — Inpatient Hospital Stay: Payer: 59

## 2018-01-26 VITALS — BP 121/78 | HR 78 | Temp 98.2°F | Resp 17

## 2018-01-26 DIAGNOSIS — Z7189 Other specified counseling: Secondary | ICD-10-CM

## 2018-01-26 DIAGNOSIS — Z5111 Encounter for antineoplastic chemotherapy: Secondary | ICD-10-CM | POA: Diagnosis not present

## 2018-01-26 DIAGNOSIS — C787 Secondary malignant neoplasm of liver and intrahepatic bile duct: Principal | ICD-10-CM

## 2018-01-26 DIAGNOSIS — C259 Malignant neoplasm of pancreas, unspecified: Secondary | ICD-10-CM

## 2018-01-26 MED ORDER — HEPARIN SOD (PORK) LOCK FLUSH 100 UNIT/ML IV SOLN
500.0000 [IU] | Freq: Once | INTRAVENOUS | Status: AC | PRN
  Administered 2018-01-26: 500 [IU]
  Filled 2018-01-26: qty 5

## 2018-01-26 MED ORDER — SODIUM CHLORIDE 0.9% FLUSH
10.0000 mL | INTRAVENOUS | Status: DC | PRN
  Administered 2018-01-26: 10 mL
  Filled 2018-01-26: qty 10

## 2018-01-26 MED ORDER — PEGFILGRASTIM INJECTION 6 MG/0.6ML ~~LOC~~
PREFILLED_SYRINGE | SUBCUTANEOUS | Status: AC
  Filled 2018-01-26: qty 0.6

## 2018-01-26 MED ORDER — PEGFILGRASTIM INJECTION 6 MG/0.6ML ~~LOC~~
6.0000 mg | PREFILLED_SYRINGE | Freq: Once | SUBCUTANEOUS | Status: AC
  Administered 2018-01-26: 6 mg via SUBCUTANEOUS

## 2018-01-26 NOTE — Patient Instructions (Signed)
Pegfilgrastim injection What is this medicine? PEGFILGRASTIM (PEG fil gra stim) is a long-acting granulocyte colony-stimulating factor that stimulates the growth of neutrophils, a type of white blood cell important in the body's fight against infection. It is used to reduce the incidence of fever and infection in patients with certain types of cancer who are receiving chemotherapy that affects the bone marrow, and to increase survival after being exposed to high doses of radiation. This medicine may be used for other purposes; ask your health care provider or pharmacist if you have questions. COMMON BRAND NAME(S): Neulasta What should I tell my health care provider before I take this medicine? They need to know if you have any of these conditions: -kidney disease -latex allergy -ongoing radiation therapy -sickle cell disease -skin reactions to acrylic adhesives (On-Body Injector only) -an unusual or allergic reaction to pegfilgrastim, filgrastim, other medicines, foods, dyes, or preservatives -pregnant or trying to get pregnant -breast-feeding How should I use this medicine? This medicine is for injection under the skin. If you get this medicine at home, you will be taught how to prepare and give the pre-filled syringe or how to use the On-body Injector. Refer to the patient Instructions for Use for detailed instructions. Use exactly as directed. Tell your healthcare provider immediately if you suspect that the On-body Injector may not have performed as intended or if you suspect the use of the On-body Injector resulted in a missed or partial dose. It is important that you put your used needles and syringes in a special sharps container. Do not put them in a trash can. If you do not have a sharps container, call your pharmacist or healthcare provider to get one. Talk to your pediatrician regarding the use of this medicine in children. While this drug may be prescribed for selected conditions,  precautions do apply. Overdosage: If you think you have taken too much of this medicine contact a poison control center or emergency room at once. NOTE: This medicine is only for you. Do not share this medicine with others. What if I miss a dose? It is important not to miss your dose. Call your doctor or health care professional if you miss your dose. If you miss a dose due to an On-body Injector failure or leakage, a new dose should be administered as soon as possible using a single prefilled syringe for manual use. What may interact with this medicine? Interactions have not been studied. Give your health care provider a list of all the medicines, herbs, non-prescription drugs, or dietary supplements you use. Also tell them if you smoke, drink alcohol, or use illegal drugs. Some items may interact with your medicine. This list may not describe all possible interactions. Give your health care provider a list of all the medicines, herbs, non-prescription drugs, or dietary supplements you use. Also tell them if you smoke, drink alcohol, or use illegal drugs. Some items may interact with your medicine. What should I watch for while using this medicine? You may need blood work done while you are taking this medicine. If you are going to need a MRI, CT scan, or other procedure, tell your doctor that you are using this medicine (On-Body Injector only). What side effects may I notice from receiving this medicine? Side effects that you should report to your doctor or health care professional as soon as possible: -allergic reactions like skin rash, itching or hives, swelling of the face, lips, or tongue -dizziness -fever -pain, redness, or irritation at site   where injected -pinpoint red spots on the skin -red or dark-brown urine -shortness of breath or breathing problems -stomach or side pain, or pain at the shoulder -swelling -tiredness -trouble passing urine or change in the amount of urine Side  effects that usually do not require medical attention (report to your doctor or health care professional if they continue or are bothersome): -bone pain -muscle pain This list may not describe all possible side effects. Call your doctor for medical advice about side effects. You may report side effects to FDA at 1-800-FDA-1088. Where should I keep my medicine? Keep out of the reach of children. Store pre-filled syringes in a refrigerator between 2 and 8 degrees C (36 and 46 degrees F). Do not freeze. Keep in carton to protect from light. Throw away this medicine if it is left out of the refrigerator for more than 48 hours. Throw away any unused medicine after the expiration date. NOTE: This sheet is a summary. It may not cover all possible information. If you have questions about this medicine, talk to your doctor, pharmacist, or health care provider.  2018 Elsevier/Gold Standard (2016-08-06 12:58:03)  

## 2018-01-31 ENCOUNTER — Other Ambulatory Visit: Payer: 59

## 2018-01-31 ENCOUNTER — Ambulatory Visit: Payer: 59 | Admitting: Nurse Practitioner

## 2018-01-31 ENCOUNTER — Ambulatory Visit: Payer: 59

## 2018-01-31 ENCOUNTER — Encounter: Payer: 59 | Admitting: Nutrition

## 2018-02-04 NOTE — Progress Notes (Signed)
Pt called to clarify apts on 02/23/18. Schedule reviewed with pt.

## 2018-02-07 ENCOUNTER — Other Ambulatory Visit: Payer: Self-pay | Admitting: Hematology

## 2018-02-09 NOTE — Progress Notes (Signed)
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 Cholelithiasis.  No secondary signs of acute cholecystitis. 2. Multiple solid liver masses measuring up to 6.5 cm in the left lobe of the liver, evaluation for metastatic disease is recommended. These results will be called to the ordering clinician or representative by the Radiologist Assistant, and communication documented in the PACS or zVision Dashboard.      07/23/2017 Imaging    CT Abdomen W Contrast 07/23/17 IMPRESSION: 1. Widespread metastatic disease throughout the liver. No clear primary malignancy identified in the abdomen. The pelvis was not imaged. Tissue sampling recommended. 2. Probable adenopathy superior to the pancreatic tail. No evidence of pancreatic mass. 3. Suspected incidental hemangioma inferiorly in the right hepatic lobe. 4. Nonspecific nodularity in the breasts. The patient has undergone recent (03/25/2017 and 04/01/2017) mammography and ultrasound.      07/29/2017 Initial Diagnosis    Metastasis to liver of unknown origin ([place])      07/29/2017 PET scan    PET 07/29/17  IMPRESSION: 1. Numerous bulky liver masses are hypermetabolic compatible with malignancy. 2. Accentuated activity within or along the pancreatic tail, likely represent a primary pancreatic tumor. Consider pancreatic protocol MRI to further work this  up. 3. The peripancreatic lymph node shown above the pancreatic tail is mildly hypermetabolic favoring malignancy. 4.  Prominent stool throughout the colon favors constipation. 5. 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      08/05/2017 Pathology Results    Liver Biopsy  Diagnosis 08/05/17 Liver, needle/core biopsy - CARCINOMA. - SEE COMMENT. Microscopic Comment The malignant cells are positive for cytokeratin 7. They are negative for arginase, CDX2, cytokeratin 20, estrogen receptor, GATA-3, GCDFP, Glypican 3, Hep Par 1, Napsin A, and TTF-1. This immunohistochemical is nonspecific. Possibly primary sources include pancreatobiliary and upper gastrointestinal. Radiologic correlation is necessary. Of note, organ specific markers (GATA-3, GCDFP-breast, TTF-1, Napsin A-lung, and CDX2-colon) are negative. (JBK:ecj 08/09/2017)      08/21/2017 -  Chemotherapy    FOLFIRINOX every 2 weeks starting 08/21/17        10/14/2017 Imaging    CT CAP WO Contrast 10/14/17 IMPRESSION: Evidence of known numerous liver metastases without significant interval change. No other evidence of metastatic disease within the chest, abdomen or pelvis. Cholelithiasis. Tiny pericardial effusion.      12/02/2017 Genetic Testing    Negative for pathogenic mutation.  The genes analyzed were the 83 genes on [facility]'s Multi-Cancer panel (ALK, APC, ATM, AXIN2, BAP1, BARD1, BLM, BMPR1A, BRCA1, BRCA2, BRIP1, CASR, CDC73, CDH1, CDK4, CDKN1B, CDKN1C, CDKN2A, CEBPA, CHEK2, CTNNA1, DICER1, DIS3L2, EGFR, EPCAM, FH, FLCN, GATA2, GPC3, GREM1, HOXB13, HRAS, KIT, MAX, MEN1, MET, MITF, MLH1, MSH2, MSH3, MSH6, MUTYH, NBN, NF1, NF2, NTHL1, PALB2, PDGFRA, PHOX2B, PMS2, POLD1, POLE, POT1, PRKAR1A, PTCH1, PTEN, RAD50, RAD51C, RAD51D, RB1, RECQL4, RET, RUNX1, SDHA, SDHAF2, SDHB, SDHC, SDHD, SMAD4, SMARCA4, SMARCB1, SMARCE1, STK11, SUFU, TERC, TERT, TMEM127, TP53, TSC1, TSC2, VHL,  WRN, WT1).      12/17/2017 PET scan    IMPRESSION: 1.  Although the liver metastases are still visible on the CT images, they are significantly smaller and retain no abnormal metabolic activity, consistent with response to therapy. 2. No abnormal activity within the pancreas. 3. No disease progression identified. 4. Cholelithiasis.       Pancreatic cancer metastasized to liver ([place])   08/11/2017 Initial Diagnosis    Pancreatic cancer metastasized to liver ([place])      08/21/2017 -  Chemotherapy    mFOLFIRINOX every 2 weeks started 08/21/17         HISTORY OF PRESENTING ILLNESS: 07/30/17  Morgan Dawson 57 y.o. 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 The patient was referred by her PCP Dr. [name]. The patient presents to the clinic today accompanied by husband.  Today the patient reports the weekend before Thanksgiving she had abdominal issues with bloating and cramps. These symptoms worsened after she would eat. This persisted past Thanksgiving so she went to her PCP.  She presented to her PCP on 07/20/17 for upper abdominal pain, nausea and bloating. She had lab work up and RUQ [place] on 07/23/17. Results showed elevated LFTs (alk phos 227, AST 191, ALT 275) and US showed multiple masses in her liver and pancreatitis. She was put on a bland die due to her pancreatitis. CT AP in 07/23/17 showed diffuse metastatic disease in her liver with no clear primary. She had a PET scan that shows possible metastasis to peripancreatic lymph node along with her known liver masses.   Today she notes she has focused soreness in RUQ. She feels nauseous and has vomited 3 times total in the past 3 weeks. She has lost weight, initially purposefully. She has lost 5-10 pounds. She has been constipated  lately. She takes dulcolax to help. She denies dark stool or GI bleeding. No hematemesis. She does feel more fatigued. She was relatively active 4-5 days a week before last month. She has not needed medication as she  feels she has more discomfort (1-2/10). She has not been exercising because she is afraid of making things worse. Her appetite is low from her discomfort and nausea. She notes multiple tender knots on her lower legs that appeared 5 nights ago. There is no itching. She notes she will follow up with Dr. Paulita Fujita this month for her pancreatitis.   Her last mammogram at the [place] was 04/01/17 and mostly benign. She was recommended to repeat in 6 months. She notes she frequently has cyst in her breast which were benign before.  Socially she is not working. She usually is active in the gym 3-4 days a week. She has 2 children (son and daughter) at 26yo and 10yo.   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 Her last episode of vertigo was a few months ago. She takes a diuretic and a low salt diet. She has jaw surgery for TMJ issues. She had endometrial ablation due to menorrhagia in 2011. She has only spotted since, no full period. Patient had a colonoscopy in 2014 with one benign polyp removed. Her father had colon cancer in his 47s. Her maternal grandfather had colon cancer in his 66s. Paternal aunt had lymphoma.   CURRENT THERAPY: mFOLFIRINOX every 2 weeks started 08/21/17, oxaliplatin held since February 10, 2018 due to neuropathy.  INTERVAL HISTORY KIRTI CARL is here for a follow up and  Cycle 12 FOLFIRINOX. She presents to the clinic today accompanied by her husband. She feels well. Her main complaint is neuropathy that presents as numbness in her feet. The numbness is there all the time. She can still carry her daily activities like walking, dressing and grooming properly, her main problem is her handwriting.  She still has chemo related diarrhea, for which she has taken Imodium daily. Nausea and appetite have improved.       MEDICAL HISTORY:  Past Medical History:  Diagnosis Date  . Genetic testing 12/02/2017   Multi-Cancer panel (83 genes) @ [facility] - No pathogenic mutations  detected  . Meniere disease 2016  . Pancreatitis     SURGICAL HISTORY: Past Surgical History:  Procedure Laterality Date  . CERVICAL ABLATION    . ENDOMETRIAL ABLATION  2011  . IR FLUORO GUIDE PORT INSERTION RIGHT  08/12/2017  . IR US GUIDE VASC ACCESS RIGHT  08/12/2017  . MANDIBLE SURGERY  1999    SOCIAL HISTORY: Social History   Socioeconomic History  . Marital status: Married    Spouse name: Not on file  . Number of children: Not on file  . Years of education: Not on file  . Highest education level: Not on file  Occupational History  . Not on file  Social Needs  . Financial resource strain: Not hard at all  . Food insecurity:    Worry: Never true    Inability: Never true  . Transportation needs:    Medical: No    Non-medical: No  Tobacco Use  . Smoking status: Never Smoker  . Smokeless tobacco: Never Used  Substance and Sexual Activity  . Alcohol use: No    Frequency: Never  . Drug use: No  . Sexual activity: Not Currently  Lifestyle  . Physical activity:    Days per week: 4 days    Minutes per session: 30 min  . Stress: Not at all  Relationships  . Social connections:    Talks on phone: Patient refused    Gets together: Patient refused    Attends religious service: Patient refused    Active member of club or organization: Patient refused    Attends meetings of clubs or organizations: Patient refused    Relationship status: Patient refused  . Intimate partner violence:    Fear of current or ex partner: Patient refused    Emotionally abused: Patient refused    Physically abused: Patient refused    Forced sexual activity: Patient refused  Other Topics Concern  . Not on file  Social History Narrative  . Not on file    FAMILY HISTORY: Family History  Problem Relation Age of Onset  . Colon cancer Father 27       currently 29  . Colon cancer Maternal Grandfather   . Breast cancer Cousin   . Thyroid cancer Mother 22       facial radiation for acne  as teen  . Lymphoma Paternal Aunt 70       deceased 64  . Breast cancer Paternal Aunt     ALLERGIES:  has No Known Allergies.  MEDICATIONS:  Current Outpatient Medications  Medication Sig Dispense Refill  . lidocaine-prilocaine (EMLA) cream Apply to affected area once 30 g 3  . loperamide (IMODIUM) 2 MG capsule Take 1 capsule (2 mg total) by mouth as needed for diarrhea or loose stools. 30 capsule 0  . loratadine (CLARITIN) 10 MG tablet Take 10 mg by mouth daily as needed  for allergies.    . magic mouthwash w/lidocaine SOLN Take 10 mLs by mouth 3 (three) times daily as needed for mouth pain. Swish and spit 10 ML by mouth 3 times daily as needed for mouth pain. 240 mL 0  . meclizine (ANTIVERT) 25 MG tablet Take 25 mg by mouth 3 (three) times daily as needed for dizziness.    . ondansetron (ZOFRAN-ODT) 8 MG disintegrating tablet TAKE 1 TABLET BY MOUTH EVERY 8 HOURS AS NEEDED FOR NAUSE OR VOMITING 40 tablet 0  . potassium chloride (K-DUR) 10 MEQ tablet Take 10 mEq by mouth 3 (three) times daily.  2  . prochlorperazine (COMPAZINE) 10 MG tablet Take 1 tablet (10 mg total) by mouth every 6 (six) hours as needed for nausea or vomiting. 40 tablet 2  . traMADol (ULTRAM) 50 MG tablet Take 1 tablet (50 mg total) by mouth every 6 (six) hours as needed. (Patient taking differently: Take 50 mg by mouth every 6 (six) hours as needed for moderate pain. ) 30 tablet 0  . dronabinol (MARINOL) 2.5 MG capsule Take 1 capsule (2.5 mg total) by mouth 2 (two) times daily before a meal. 40 capsule 0   No current facility-administered medications for this visit.    Facility-Administered Medications Ordered in Other Visits  Medication Dose Route Frequency Provider Last Rate Last Dose  . fluorouracil (ADRUCIL) 2,950 mg in sodium chloride 0.9 % 91 mL chemo infusion  2,000 mg/m2 (Treatment Plan Recorded) Intravenous 1 day or 1 dose [name], MD   2,950 mg at 02/10/18 1223    REVIEW OF SYSTEMS:  Constitutional:  Denies fevers, chills or abnormal night sweats No fatigue, change in appetite, or weight loss Eyes: Denies blurriness of vision, double vision or watery eyes Ears, nose, mouth, throat, and face: Denies mucositis or sore throat Respiratory: Denies cough, dyspnea or wheezes Cardiovascular: Denies palpitation, chest discomfort or lower extremity swelling Gastrointestinal:  No nausea constipation (+) diarrhea Skin: Denies abnormal skin rashes, tender knots down lower legs bilaterally Lymphatics: Denies new lymphadenopathy or easy bruising Neurological:Denies new weaknesses (+) numbness and tingling in extremities, worse in LL. Behavioral/Psych: Mood is stable, no new changes  All other systems were reviewed with the patient and are negative.  PHYSICAL EXAMINATION:  ECOG PERFORMANCE STATUS: 2 BP 124/83 (BP Location: Left Arm, Patient Position: Sitting)   Pulse 86   Temp 98.4 F (36.9 C) (Oral)   Resp 18   Ht 5' 4.96" (1.65 m)   Wt 106 lb (48.1 kg)   LMP 10/23/2015   SpO2 100%   BMI 17.66 kg/m   Vitals:   02/10/18 0810  BP: 124/83  Pulse: 86  Resp: 18  Temp: 98.4 F (36.9 C)  SpO2: 100%    GENERAL:alert, no distress and comfortable SKIN: skin color, texture, turgor are normal, no rashes or significant lesions EYES: normal, conjunctiva are pink and non-injected, sclera clear OROPHARYNX:no exudate, no erythema and lips, buccal mucosa, and tongue normal  NECK: supple, thyroid normal size, non-tender, without nodularity LYMPH:  no palpable lymphadenopathy in the cervical, axillary or inguinal LUNGS: clear to auscultation and percussion with normal breathing effort HEART: regular rate & rhythm and no murmurs and no lower extremity edema ABDOMEN:abdomen soft, normal bowel sounds. No hepatomegaly or tenderness on palpation (+) small hernia in the left inguial area Musculoskeletal:no cyanosis of digits and no clubbing  PSYCH: alert & oriented x 3 with fluent speech NEURO: no focal  motor/sensory deficits  LABORATORY DATA:  I have  reviewed the data as listed CBC Latest Ref Rng & Units 02/10/2018 01/24/2018 01/21/2018  WBC 3.9 - 10.3 K/uL 6.2 4.8 8.8  Hemoglobin 11.6 - 15.9 g/dL 9.1(L) 10.1(L) 10.5(L)  Hematocrit 34.8 - 46.6 % 27.9(L) 29.3(L) 30.7(L)  Platelets 145 - 400 K/uL 118(L) 131(L) 149    CMP Latest Ref Rng & Units 02/10/2018 01/24/2018 01/21/2018  Glucose 70 - 140 mg/dL 131 105 106  BUN 7 - 26 mg/dL 11 10 10   Creatinine 0.60 - 1.10 mg/dL 1.21(H) 1.19(H) 1.07  Sodium 136 - 145 mmol/L 141 141 139  Potassium 3.5 - 5.1 mmol/L 3.5 3.4(L) 3.5  Chloride 98 - 109 mmol/L 105 105 103  CO2 22 - 29 mmol/L 26 26 26   Calcium 8.4 - 10.4 mg/dL 9.2 8.4 8.3(L)  Total Protein 6.4 - 8.3 g/dL 7.3 6.6 6.8  Total Bilirubin 0.2 - 1.2 mg/dL 0.7 0.7 0.8  Alkaline Phos 40 - 150 U/L 405(H) 354(H) 331(H)  AST 5 - 34 U/L 123(H) 134(H) 125(H)  ALT 0 - 55 U/L 112(H) 104(H) 99(H)    PATHOLOGY  Liver Biopsy  Diagnosis 08/05/17 Liver, needle/core biopsy - CARCINOMA. - SEE COMMENT. Microscopic Comment The malignant cells are positive for cytokeratin 7. They are negative for arginase, CDX2, cytokeratin 20, estrogen receptor, GATA-3, GCDFP, Glypican 3, Hep Par 1, Napsin A, and TTF-1. This immunohistochemical is nonspecific. Possibly primary sources include pancreatobiliary and upper gastrointestinal. Radiologic correlation is necessary. Of note, organ specific markers (GATA-3, GCDFP-breast, TTF-1, Napsin A-lung, and CDX2-colon) are negative. (JBK:ecj 08/09/2017)   Diagnosis 03/07/13 Surgical, cecum, polyp -TUBULAR ADENOMA -NEGATIVE FOR HIGH GRADE DYSPLASIA   PROCEDURES  Colonoscopy by Dr. Paulita Fujita 03/07/13 IMPRESSION:  -One 5 m polyp in the cecum. Resected and retrieved -The distal rectum and anal verge are normal on retroflexion view.  -The examination was otherwise normal.    RADIOGRAPHIC STUDIES: I have personally reviewed the radiological images as listed and agreed with the  findings in the report.   X-ray Chest Pa And Lateral  Result Date: 01/13/2018 CLINICAL DATA:  Cough.  Lung crackles. EXAM: CHEST - 2 VIEW COMPARISON:  CT 10/14/2017.  Chest x-ray 08/20/2018. FINDINGS: PowerPort catheter with tip over superior vena cava. Heart size normal. No focal infiltrate. Tiny bilateral pleural effusions. No acute bony abnormality. IMPRESSION: 1.  PowerPort catheter noted with tip in superior vena cava. 2. Tiny bilateral pleural effusions. Exam otherwise unremarkable. No focal pulmonary infiltrate noted. Electronically Signed   By: Marcello Moores  Register   On: 01/13/2018 10:52   PET Scan 12/17/17 IMPRESSION: 1. Although the liver metastases are still visible on the CT images, they are significantly smaller and retain no abnormal metabolic activity, consistent with response to therapy. 2. No abnormal activity within the pancreas. 3. No disease progression identified. 4. Cholelithiasis.  CT CAP WO Contrast 10/14/17 IMPRESSION: Evidence of known numerous liver metastases without significant interval change. No other evidence of metastatic disease within the chest, abdomen or pelvis. Cholelithiasis. Tiny pericardial effusion.   ASSESSMENT & PLAN:  JANAL HAAK is a 57 y.o. caucasian female with a history of vertigo and menieres disease, presented with epigastric discomfort, low appetite and 5 pound weight loss   1. 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  07/29/17 PET scan shows diffuse metastatic disease in her enlarged liver.  This is most consistent with diffuse liver metastasis.  -Her liver  biopsy confirmed metastatic adenocarcinoma, most consistent with biliary or pancreatic primary this was discussed with patient.  -After further reviewing her 07/29/17 PET in the GI Tumor Board, the initial uptake in the duodenum is felt  to be in the pancrease tail, which represents the primary tumor.  The consensus from GI tumor board is this is most consistent with metastatic pancreatic adenocarcinoma to liver.  We felt she does not need further imaging or EUS to confirm the primary site.  -Given her metastatic stage IV disease, her metastatic cancer is unfortunately not curable, and the goal of therapy is palliative, to prolong her life and improve her quality of life.  -Goal of therapy is to control the disease.  -she started first line FOLFIRINOX on 08/21/17, -She has been tolerating chemotherapy moderately well, with some nausea, cold sensitivity, diarrhea, and fatigue, she has developed slightly worsening renal function, has received IV fluids intermittently  -CT CAP WO Contrast from 10/14/17 reveals stable disease, liver metastases without significant interval change. No other evidence of metastatic disease within the chest, abdomen or pelvis. I discussed results with pt. due to her chronic kidney disease, she is not able to receive IV contrast, which limits the quality of CT,  I may consider PET scan in the future.  -Her CA 19.9 continues to decrease. This is a good sign that her regimen is working to control her disease.  -her PET scan from 12/17/17 revealed that her liver mets are significantly smaller and retain no abnormal metabolic activity. No new lesions identified. I discussed these results with the pt and her husband. They are very pleased.  -She has developed persistent peripheral neuropathy, grade 2, I will stop her oxaliplatin 02/10/2018 -She will go to [place] in July and we will give her a chemo break at this time  -Labs reviewed, adequate to continue with Cycle 12 chemo, will do FOLFIRI today, and switch to 5-FU pump and liposomal irinotecan from next cycle.    -Due to her CKD, will avoid IV contrast for CT scan, will do restaging with PET scan in 2 weeks  2. Pancreatitis -Found by 07/23/17 CT scan, likely related to  her cancer.  -On bland diet -I previously suggested Prilosec and to continue small meals with bland diet. If needed I can give pain medication. -Abdominal pain is mild, no need for pain medication at this time.    3. Nausea, and diarrhea  -She has had nausea since her cancer diagnosis, she knows to use Zofran and Compazine alternatively for nausea as needed off chemo -She had severe nausea and vomiting towards the end of the chemo infusion cycle 2 -Emend was added with cycle 3, her n/v are overall improved from previous cycles. She had 2 days of mild n/v after pump d/c, managed with zofran and compazine. She will continue current supportive regimen. Will monitor.  -She has mild diarrhea after chemo, has not needed antidiarrheal medication  4. Anorexia, weight loss -She has anxiety about eating due to emesis.  -I previously encouraged her to eat more small meals with bland or liquid diet. She can increase her intake of ensure boost -Follow-up with dietitian -weight stable lately   5. Hypercalcemia -Secondary to her underlying malignancy -Received Zometa and IV hydration in December 2018, hypercalcemia has resolved now. -Okay to restart dairy products, she is off calcium supplement. -She has cramps as of late, her Ca is 9.9 on 2/25 and 3/18, okay to restart calcium supplement for leg cramps. She can also  take magnesium.  6.  Transaminitis -Due to her underlying liver metastasis and chemo  -Improved since she started chemo, slightly worse again lately  -Monitoring  7. Goal of care discussion  -We previously discussed the incurable nature of her cancer, and the overall poor prognosis, especially if she does not have good response to chemotherapy or progress on chemo -The patient understands the goal of care is palliative. -I previously recommended DNR/DNI, she will think about it   8. Hypokalemia, CKD stage III -She takes 10-40 mEq on a given day depending on her ability to swallow  pills. K was 3.0 10/04/17. She does not have heavy GI output. I recommended she take 20 mEq daily consistently.  -Creatinine continues to increase; she appears adequately hydrated. Could be related to chemotherapy. Will check mag to make sure it is normal. Will obtain uric acid to r/o tumor lysis.  -will check labs closely, add on with pump d/c on day 3 -her Cr is 1.97 on (10/18/17), so she received IVF, her K was 3.2, She can increase to 3 potassium supplements for the next 3 days.  -Cr remains presistently high, I stopped her spironolactone on 11/01/17 - US renal from 11/08/17 revealed hyperechoic renal cortex bilaterally without hydronephrosis -continue KCL supplement   9. Meniere's disease -She had recent meniere's flare with dizziness since last cycle. She is not currently on daily treatment; takes meclizine PRN. I previously discussed with ENT, she was prescribed aldactone. She has been holding due to increased creatinine.   10. Genetics -She previously met with genetic counselor and test was not recommended  -I informed pt today that testing will be beneficial to know if she will be a candidate for other drugs in the future if she is tested positive for Lynch Syndrome or BRCA1/BRCA2 mutation carrier  -Her genetic testing (83 cancer related genes) was negative  11.  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  I suggest her to take a B complex.  PLAN: -Lab reviewed, adequate for treatment.  Due to worsening neuropathy, will hold oxaliplatin, and proceed FOLFIRI today -She will return in 2 weeks to start 5-FU pump infusion and liposomal irinotecan -PET scan before next visit   Orders Placed This Encounter  Procedures  . CT Abdomen Pelvis W Contrast    Standing Status:   Future    Standing Expiration Date:   02/10/2019      Order Specific Question:   If indicated for the ordered procedure, I authorize the administration of contrast media per Radiology protocol    Answer:   Yes    Order Specific Question:   Is patient pregnant?    Answer:   No    Order Specific Question:   Preferred imaging location?    Answer:   [facility]    Order Specific Question:   Is Oral Contrast requested for this exam?    Answer:   Yes, Per Radiology protocol    Order Specific Question:   Radiology Contrast Protocol - do NOT remove file path    Answer:   [email or web address]  . CT Chest W Contrast    Standing Status:   Future    Standing Expiration Date:   02/10/2019    Order Specific Question:   If indicated for the ordered procedure, I authorize the administration of contrast  media per Radiology protocol    Answer:   Yes    Order Specific Question:   Is patient pregnant?    Answer:   No    Order Specific Question:   Preferred imaging location?    Answer:   [facility]    Order Specific Question:   Radiology Contrast Protocol - do NOT remove file path    Answer:   \\[email or web address]    All questions were answered. The patient knows to call the clinic with any problems, questions or concerns. I spent 25 minutes counseling the patient face to face. The total time spent in the appointment was 30 minutes and more than 50% was on counseling.  I have reviewed the above documentation for accuracy and completeness, and I agree with the above.  Dierdre Searles Dweik am acting as scribe for Dr. [name].  I have reviewed the above documentation for accuracy and completeness, and I agree with the above.    [name], MD 02/10/2018

## 2018-02-10 ENCOUNTER — Encounter: Payer: Self-pay | Admitting: Hematology

## 2018-02-10 ENCOUNTER — Inpatient Hospital Stay: Payer: 59

## 2018-02-10 ENCOUNTER — Inpatient Hospital Stay (HOSPITAL_BASED_OUTPATIENT_CLINIC_OR_DEPARTMENT_OTHER): Payer: 59 | Admitting: Hematology

## 2018-02-10 ENCOUNTER — Telehealth: Payer: Self-pay | Admitting: Hematology

## 2018-02-10 VITALS — BP 124/83 | HR 86 | Temp 98.4°F | Resp 18 | Ht 64.96 in | Wt 106.0 lb

## 2018-02-10 DIAGNOSIS — Z5111 Encounter for antineoplastic chemotherapy: Secondary | ICD-10-CM | POA: Diagnosis not present

## 2018-02-10 DIAGNOSIS — R740 Nonspecific elevation of levels of transaminase and lactic acid dehydrogenase [LDH]: Secondary | ICD-10-CM | POA: Diagnosis not present

## 2018-02-10 DIAGNOSIS — R110 Nausea: Secondary | ICD-10-CM

## 2018-02-10 DIAGNOSIS — G620 Drug-induced polyneuropathy: Secondary | ICD-10-CM | POA: Diagnosis not present

## 2018-02-10 DIAGNOSIS — C259 Malignant neoplasm of pancreas, unspecified: Secondary | ICD-10-CM | POA: Diagnosis not present

## 2018-02-10 DIAGNOSIS — C251 Malignant neoplasm of body of pancreas: Secondary | ICD-10-CM

## 2018-02-10 DIAGNOSIS — T451X5A Adverse effect of antineoplastic and immunosuppressive drugs, initial encounter: Secondary | ICD-10-CM | POA: Diagnosis not present

## 2018-02-10 DIAGNOSIS — Z95828 Presence of other vascular implants and grafts: Secondary | ICD-10-CM

## 2018-02-10 DIAGNOSIS — C787 Secondary malignant neoplasm of liver and intrahepatic bile duct: Secondary | ICD-10-CM | POA: Diagnosis not present

## 2018-02-10 DIAGNOSIS — R197 Diarrhea, unspecified: Secondary | ICD-10-CM | POA: Diagnosis not present

## 2018-02-10 DIAGNOSIS — R630 Anorexia: Secondary | ICD-10-CM | POA: Diagnosis not present

## 2018-02-10 DIAGNOSIS — R634 Abnormal weight loss: Secondary | ICD-10-CM | POA: Diagnosis not present

## 2018-02-10 DIAGNOSIS — Z7189 Other specified counseling: Secondary | ICD-10-CM

## 2018-02-10 DIAGNOSIS — K859 Acute pancreatitis without necrosis or infection, unspecified: Secondary | ICD-10-CM

## 2018-02-10 LAB — COMPREHENSIVE METABOLIC PANEL
ALT: 112 U/L — AB (ref 0–55)
AST: 123 U/L — ABNORMAL HIGH (ref 5–34)
Albumin: 3.2 g/dL — ABNORMAL LOW (ref 3.5–5.0)
Alkaline Phosphatase: 405 U/L — ABNORMAL HIGH (ref 40–150)
Anion gap: 10 (ref 3–11)
BUN: 11 mg/dL (ref 7–26)
CHLORIDE: 105 mmol/L (ref 98–109)
CO2: 26 mmol/L (ref 22–29)
CREATININE: 1.21 mg/dL — AB (ref 0.60–1.10)
Calcium: 9.2 mg/dL (ref 8.4–10.4)
GFR calc Af Amer: 57 mL/min — ABNORMAL LOW (ref >60)
GFR calc non Af Amer: 49 mL/min — ABNORMAL LOW (ref >60)
Glucose, Bld: 131 mg/dL (ref 70–140)
Potassium: 3.5 mmol/L (ref 3.5–5.1)
Sodium: 141 mmol/L (ref 136–145)
Total Bilirubin: 0.7 mg/dL (ref 0.2–1.2)
Total Protein: 7.3 g/dL (ref 6.4–8.3)

## 2018-02-10 LAB — CBC WITH DIFFERENTIAL/PLATELET
BASOS PCT: 0 %
Basophils Absolute: 0 10*3/uL (ref 0.0–0.1)
EOS ABS: 0 10*3/uL (ref 0.0–0.5)
Eosinophils Relative: 1 %
HCT: 27.9 % — ABNORMAL LOW (ref 34.8–46.6)
HEMOGLOBIN: 9.1 g/dL — AB (ref 11.6–15.9)
LYMPHS ABS: 0.9 10*3/uL (ref 0.9–3.3)
Lymphocytes Relative: 15 %
MCH: 33.8 pg (ref 25.1–34.0)
MCHC: 32.6 g/dL (ref 31.5–36.0)
MCV: 103.7 fL — ABNORMAL HIGH (ref 79.5–101.0)
Monocytes Absolute: 0.8 10*3/uL (ref 0.1–0.9)
Monocytes Relative: 13 %
Neutro Abs: 4.4 10*3/uL (ref 1.5–6.5)
Neutrophils Relative %: 71 %
Platelets: 118 10*3/uL — ABNORMAL LOW (ref 145–400)
RBC: 2.69 MIL/uL — AB (ref 3.70–5.45)
RDW: 19.1 % — ABNORMAL HIGH (ref 11.2–14.5)
WBC: 6.2 10*3/uL (ref 3.9–10.3)

## 2018-02-10 MED ORDER — IRINOTECAN HCL CHEMO INJECTION 100 MG/5ML
120.0000 mg/m2 | Freq: Once | INTRAVENOUS | Status: AC
Start: 2018-02-10 — End: 2018-02-10
  Administered 2018-02-10: 180 mg via INTRAVENOUS
  Filled 2018-02-10: qty 9

## 2018-02-10 MED ORDER — SODIUM CHLORIDE 0.9 % IV SOLN
Freq: Once | INTRAVENOUS | Status: AC
  Administered 2018-02-10: 10:00:00 via INTRAVENOUS
  Filled 2018-02-10: qty 5

## 2018-02-10 MED ORDER — ATROPINE SULFATE 1 MG/ML IJ SOLN
0.5000 mg | Freq: Once | INTRAMUSCULAR | Status: AC | PRN
  Administered 2018-02-10: 0.5 mg via INTRAVENOUS

## 2018-02-10 MED ORDER — SODIUM CHLORIDE 0.9% FLUSH
10.0000 mL | Freq: Once | INTRAVENOUS | Status: AC
  Administered 2018-02-10: 10 mL
  Filled 2018-02-10: qty 10

## 2018-02-10 MED ORDER — ATROPINE SULFATE 1 MG/ML IJ SOLN
INTRAMUSCULAR | Status: AC
  Filled 2018-02-10: qty 1

## 2018-02-10 MED ORDER — PALONOSETRON HCL INJECTION 0.25 MG/5ML
INTRAVENOUS | Status: AC
Start: 2018-02-10 — End: ?
  Filled 2018-02-10: qty 5

## 2018-02-10 MED ORDER — SODIUM CHLORIDE 0.9 % IV SOLN
2000.0000 mg/m2 | INTRAVENOUS | Status: DC
  Administered 2018-02-10: 2950 mg via INTRAVENOUS
  Filled 2018-02-10: qty 59

## 2018-02-10 MED ORDER — PALONOSETRON HCL INJECTION 0.25 MG/5ML
0.2500 mg | Freq: Once | INTRAVENOUS | Status: AC
  Administered 2018-02-10: 0.25 mg via INTRAVENOUS

## 2018-02-10 MED ORDER — LEUCOVORIN CALCIUM INJECTION 350 MG
400.0000 mg/m2 | Freq: Once | INTRAVENOUS | Status: AC
  Administered 2018-02-10: 592 mg via INTRAVENOUS
  Filled 2018-02-10: qty 29.6

## 2018-02-10 MED ORDER — DEXTROSE 5 % IV SOLN
Freq: Once | INTRAVENOUS | Status: AC
  Administered 2018-02-10: 09:00:00 via INTRAVENOUS

## 2018-02-10 NOTE — Telephone Encounter (Signed)
Scheduled appt per 6/20 los - gave patient aVS and calender per los.  

## 2018-02-10 NOTE — Patient Instructions (Addendum)
[facility] Discharge Instructions for Patients Receiving Chemotherapy  Today you received the following chemotherapy agents leucovorin,Irinotecan,Adrucil   To help prevent nausea and vomiting after your treatment, we encourage you to take your nausea medication as directed  If you develop nausea and vomiting that is not controlled by your nausea medication, call the clinic.   BELOW ARE SYMPTOMS THAT SHOULD BE REPORTED IMMEDIATELY:  *FEVER GREATER THAN 100.5 F  *CHILLS WITH OR WITHOUT FEVER  NAUSEA AND VOMITING THAT IS NOT CONTROLLED WITH YOUR NAUSEA MEDICATION  *UNUSUAL SHORTNESS OF BREATH  *UNUSUAL BRUISING OR BLEEDING  TENDERNESS IN MOUTH AND THROAT WITH OR WITHOUT PRESENCE OF ULCERS  *URINARY PROBLEMS  *BOWEL PROBLEMS  UNUSUAL RASH Items with * indicate a potential emergency and should be followed up as soon as possible.  Feel free to call the clinic should you have any questions or concerns. The clinic phone number is [phone].  Please show the [facility] at check-in to the Emergency Department and triage nurse.

## 2018-02-11 ENCOUNTER — Other Ambulatory Visit (HOSPITAL_COMMUNITY)
Admission: RE | Admit: 2018-02-11 | Discharge: 2018-02-11 | Disposition: A | Discharge status: Home / Self Care | Payer: 59 | Source: Ambulatory Visit | Attending: Hematology | Admitting: Hematology

## 2018-02-11 DIAGNOSIS — C787 Secondary malignant neoplasm of liver and intrahepatic bile duct: Secondary | ICD-10-CM | POA: Insufficient documentation

## 2018-02-12 ENCOUNTER — Inpatient Hospital Stay: Payer: 59

## 2018-02-12 VITALS — BP 105/71 | HR 66 | Temp 97.8°F | Resp 17

## 2018-02-12 DIAGNOSIS — Z7189 Other specified counseling: Secondary | ICD-10-CM

## 2018-02-12 DIAGNOSIS — Z5111 Encounter for antineoplastic chemotherapy: Secondary | ICD-10-CM | POA: Diagnosis not present

## 2018-02-12 DIAGNOSIS — C787 Secondary malignant neoplasm of liver and intrahepatic bile duct: Principal | ICD-10-CM

## 2018-02-12 DIAGNOSIS — C259 Malignant neoplasm of pancreas, unspecified: Secondary | ICD-10-CM

## 2018-02-12 MED ORDER — PEGFILGRASTIM INJECTION 6 MG/0.6ML ~~LOC~~
6.0000 mg | PREFILLED_SYRINGE | Freq: Once | SUBCUTANEOUS | Status: AC
  Administered 2018-02-12: 6 mg via SUBCUTANEOUS

## 2018-02-12 MED ORDER — SODIUM CHLORIDE 0.9% FLUSH
10.0000 mL | INTRAVENOUS | Status: DC | PRN
  Administered 2018-02-12: 10 mL
  Filled 2018-02-12: qty 10

## 2018-02-12 MED ORDER — PEGFILGRASTIM INJECTION 6 MG/0.6ML ~~LOC~~
PREFILLED_SYRINGE | SUBCUTANEOUS | Status: AC
Start: 2018-02-12 — End: ?
  Filled 2018-02-12: qty 0.6

## 2018-02-12 MED ORDER — HEPARIN SOD (PORK) LOCK FLUSH 100 UNIT/ML IV SOLN
500.0000 [IU] | Freq: Once | INTRAVENOUS | Status: AC | PRN
  Administered 2018-02-12: 500 [IU]
  Filled 2018-02-12: qty 5

## 2018-02-12 NOTE — Patient Instructions (Signed)
[facility] Discharge Instructions for Patients Receiving Chemotherapy  Today you received the following chemotherapy agents leucovorin,Irinotecan,Adrucil   To help prevent nausea and vomiting after your treatment, we encourage you to take your nausea medication as directed  If you develop nausea and vomiting that is not controlled by your nausea medication, call the clinic.   BELOW ARE SYMPTOMS THAT SHOULD BE REPORTED IMMEDIATELY:  *FEVER GREATER THAN 100.5 F  *CHILLS WITH OR WITHOUT FEVER  NAUSEA AND VOMITING THAT IS NOT CONTROLLED WITH YOUR NAUSEA MEDICATION  *UNUSUAL SHORTNESS OF BREATH  *UNUSUAL BRUISING OR BLEEDING  TENDERNESS IN MOUTH AND THROAT WITH OR WITHOUT PRESENCE OF ULCERS  *URINARY PROBLEMS  *BOWEL PROBLEMS  UNUSUAL RASH Items with * indicate a potential emergency and should be followed up as soon as possible.  Feel free to call the clinic should you have any questions or concerns. The clinic phone number is [phone].  Please show the [facility] at check-in to the Emergency Department and triage nurse.

## 2018-02-17 ENCOUNTER — Other Ambulatory Visit: Payer: Self-pay

## 2018-02-17 DIAGNOSIS — C259 Malignant neoplasm of pancreas, unspecified: Secondary | ICD-10-CM

## 2018-02-17 DIAGNOSIS — C787 Secondary malignant neoplasm of liver and intrahepatic bile duct: Principal | ICD-10-CM

## 2018-02-17 NOTE — Progress Notes (Signed)
Per Dr. Burr Medico ordered extra purple top for MSI testing to be send to [facility] pathology.

## 2018-02-20 DIAGNOSIS — C259 Malignant neoplasm of pancreas, unspecified: Secondary | ICD-10-CM | POA: Diagnosis not present

## 2018-02-21 ENCOUNTER — Ambulatory Visit (HOSPITAL_COMMUNITY)
Admission: RE | Admit: 2018-02-21 | Discharge: 2018-02-21 | Disposition: A | Discharge status: Home / Self Care | Payer: 59 | Source: Ambulatory Visit | Attending: Hematology | Admitting: Hematology

## 2018-02-21 DIAGNOSIS — R918 Other nonspecific abnormal finding of lung field: Secondary | ICD-10-CM | POA: Diagnosis not present

## 2018-02-21 DIAGNOSIS — C259 Malignant neoplasm of pancreas, unspecified: Secondary | ICD-10-CM | POA: Diagnosis not present

## 2018-02-21 DIAGNOSIS — C787 Secondary malignant neoplasm of liver and intrahepatic bile duct: Secondary | ICD-10-CM | POA: Diagnosis not present

## 2018-02-21 DIAGNOSIS — K8020 Calculus of gallbladder without cholecystitis without obstruction: Secondary | ICD-10-CM | POA: Diagnosis not present

## 2018-02-21 DIAGNOSIS — C251 Malignant neoplasm of body of pancreas: Secondary | ICD-10-CM

## 2018-02-21 LAB — GLUCOSE, CAPILLARY: Glucose-Capillary: 104 mg/dL — ABNORMAL HIGH (ref 70–99)

## 2018-02-21 MED ORDER — FLUDEOXYGLUCOSE F - 18 (FDG) INJECTION
5.3000 | Freq: Once | INTRAVENOUS | Status: AC | PRN
  Administered 2018-02-21: 5.3 via INTRAVENOUS

## 2018-02-22 NOTE — Progress Notes (Signed)
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 Cholelithiasis.  No secondary signs of acute cholecystitis. 2. Multiple solid liver masses measuring up to 6.5 cm in the left lobe of the liver, evaluation for metastatic disease is recommended. These results will be called to the ordering clinician or representative by the Radiologist Assistant, and communication documented in the PACS or zVision Dashboard.      07/23/2017 Imaging    CT Abdomen W Contrast 07/23/17 IMPRESSION: 1. Widespread metastatic disease throughout the liver. No clear primary malignancy identified in the abdomen. The pelvis was not imaged. Tissue sampling recommended. 2. Probable adenopathy superior to the pancreatic tail. No evidence of pancreatic mass. 3. Suspected incidental hemangioma inferiorly in the right hepatic lobe. 4. Nonspecific nodularity in the breasts. The patient has undergone recent (03/25/2017 and 04/01/2017) mammography and ultrasound.      07/29/2017 Initial Diagnosis    Metastasis to liver of unknown origin ([place])      07/29/2017 PET scan    PET 07/29/17  IMPRESSION: 1. Numerous bulky liver masses are hypermetabolic compatible with malignancy. 2. Accentuated activity within or along the pancreatic tail, likely represent a primary pancreatic tumor. Consider pancreatic protocol MRI to further  work this up. 3. The peripancreatic lymph node shown above the pancreatic tail is mildly hypermetabolic favoring malignancy. 4.  Prominent stool throughout the colon favors constipation. 5. 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      08/05/2017 Pathology Results    Liver Biopsy  Diagnosis 08/05/17 Liver, needle/core biopsy - CARCINOMA. - SEE COMMENT. Microscopic Comment The malignant cells are positive for cytokeratin 7. They are negative for arginase, CDX2, cytokeratin 20, estrogen receptor, GATA-3, GCDFP, Glypican 3, Hep Par 1, Napsin A, and TTF-1. This immunohistochemical is nonspecific. Possibly primary sources include pancreatobiliary and upper gastrointestinal. Radiologic correlation is necessary. Of note, organ specific markers (GATA-3, GCDFP-breast, TTF-1, Napsin A-lung, and CDX2-colon) are negative. (JBK:ecj 08/09/2017)      08/21/2017 -  Chemotherapy    FOLFIRINOX every 2 weeks starting 08/21/17        10/14/2017 Imaging    CT CAP WO Contrast 10/14/17 IMPRESSION: Evidence of known numerous liver metastases without significant interval change. No other evidence of metastatic disease within the chest, abdomen or pelvis. Cholelithiasis. Tiny pericardial effusion.      12/02/2017 Genetic Testing    Negative for pathogenic mutation.  The genes analyzed were the 83 genes on [facility]'s Multi-Cancer panel (ALK, APC, ATM, AXIN2, BAP1, BARD1, BLM, BMPR1A, BRCA1, BRCA2, BRIP1, CASR, CDC73, CDH1, CDK4, CDKN1B, CDKN1C, CDKN2A, CEBPA, CHEK2, CTNNA1, DICER1, DIS3L2, EGFR, EPCAM, FH, FLCN, GATA2, GPC3, GREM1, HOXB13, HRAS, KIT, MAX, MEN1, MET, MITF, MLH1, MSH2, MSH3, MSH6, MUTYH, NBN, NF1, NF2, NTHL1, PALB2, PDGFRA, PHOX2B, PMS2, POLD1, POLE, POT1, PRKAR1A, PTCH1, PTEN, RAD50, RAD51C, RAD51D, RB1, RECQL4, RET, RUNX1, SDHA, SDHAF2, SDHB, SDHC, SDHD, SMAD4, SMARCA4, SMARCB1, SMARCE1, STK11, SUFU, TERC, TERT, TMEM127, TP53, TSC1,  TSC2, VHL, WRN, WT1).      12/17/2017 PET scan     IMPRESSION: 1. Although the liver metastases are still visible on the CT images, they are significantly smaller and retain no abnormal metabolic activity, consistent with response to therapy. 2. No abnormal activity within the pancreas. 3. No disease progression identified. 4. Cholelithiasis.      02/21/2018 PET scan    02/21/2018 PET Scan  IMPRESSION: 1. There are 2 new small nodules identified within the right lung which measure up to 5 mm. These are too small to reliably characterize by PET-CT, but warrant close interval follow-up. 2. Again noted are multifocal liver metastasis. The target lesion within the left lobe is slightly decreased in size when compared with the previous exam. Similar to previous exam there is no abnormal hypermetabolism above background liver activity identified within the liver lesions. 3. Gallstones.      02/22/2018 -  Chemotherapy    5-FU pump infusion and liposomal irinotecan, every 2 weeks.  First cycle dose reduced due to cytopenia in the travel.       Pancreatic cancer metastasized to liver ([place])   08/11/2017 Initial Diagnosis    Pancreatic cancer metastasized to liver [facility])        HISTORY OF PRESENTING ILLNESS: 07/30/17  Morgan Dawson 57 y.o. 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 The patient was referred by her PCP Dr. [name]. The patient presents to the clinic today accompanied by husband.  Today the patient reports the weekend before Thanksgiving she had abdominal issues with bloating and cramps. These symptoms worsened after she would eat. This persisted past Thanksgiving so she went to her PCP.  She presented to her PCP on 07/20/17 for upper abdominal pain, nausea and bloating. She had lab work up and RUQ [place] on 07/23/17. Results showed elevated LFTs (alk phos 227, AST 191, ALT 275) and US showed multiple masses in her liver and pancreatitis. She was put on a bland die due to her  pancreatitis. CT AP in 07/23/17 showed diffuse metastatic disease in her liver with no clear primary. She had a PET scan that shows possible metastasis to peripancreatic lymph node along with her known liver masses.   Today she notes she has focused soreness in RUQ. She feels nauseous and has vomited 3 times total in the past 3 weeks. She has lost weight, initially purposefully. She has lost 5-10 pounds. She has been constipated  lately. She takes dulcolax to help. She denies dark stool or GI bleeding. No hematemesis. She does feel more fatigued. She was relatively active 4-5 days a week before last month. She has not needed medication as she feels she has more discomfort (1-2/10). She has not been exercising because she is afraid of making things worse. Her appetite is low from her discomfort and nausea. She notes multiple tender knots on her lower legs that appeared 5 nights ago. There is no itching. She notes she will follow up with Dr. Paulita Fujita this month for her pancreatitis.   Her last mammogram at the [facility] was 04/01/17 and mostly benign. She was recommended to repeat in 6 months. She notes she frequently has cyst in her breast which were benign before.  Socially she is not working. She usually is active in the gym 3-4 days a week. She has 2 children (son and daughter) at 10yo and 7yo.   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 Her last episode of vertigo was a few months ago. She takes a diuretic and a low salt diet. She has jaw surgery for TMJ issues. She had endometrial ablation due to menorrhagia in 2011. She has only spotted since, no full period. Patient had a colonoscopy in 2014 with one benign polyp removed. Her father had colon cancer in his 30s. Her maternal grandfather had colon cancer in his 21s. Paternal aunt had lymphoma.   CURRENT THERAPY: second line 5-FU pump infusion and liposomal irinotecan every 2 weeks, started on 02/22/2018  INTERVAL HISTORY Morgan Dawson is  here for a follow up and Cycle 12 FOLFIRINOX. She presents in the infusion room today accompanied by her husband.  She feels well today.  She doesn't feel different from last cycle. Her neuropathy is still there in all her extremities. Her diarrhea is controlled with imodium and it is not daily. Her appetite and weight have improved. She has gained 2 Ibs since last visit.  She reports that her gums bled a few times. No change in stool color or visible blood. No SOB on exertion. She gets fatigued easily.  She states that she is travelling soon, and she is prepared with the needed precautions.      MEDICAL HISTORY:  Past Medical History:  Diagnosis Date  . Genetic testing 12/02/2017   Multi-Cancer panel (83 genes) @ [facility] - No pathogenic mutations detected  . Meniere disease 2016  . Pancreatitis     SURGICAL HISTORY: Past Surgical History:  Procedure Laterality Date  . CERVICAL ABLATION    . ENDOMETRIAL ABLATION  2011  . IR FLUORO GUIDE PORT INSERTION RIGHT  08/12/2017  . IR US GUIDE VASC ACCESS RIGHT  08/12/2017  . MANDIBLE SURGERY  1999    SOCIAL HISTORY: Social History   Socioeconomic History  . Marital status: Married    Spouse name: Not on file  . Number of children: Not on file  . Years of education: Not on file  . Highest education level: Not on file  Occupational History  . Not on file  Social Needs  . Financial resource strain: Not hard at all  . Food insecurity:    Worry: Never true    Inability: Never true  . Transportation needs:    Medical: No    Non-medical: No  Tobacco Use  . Smoking status: Never Smoker  . Smokeless tobacco: Never Used  Substance and Sexual Activity  . Alcohol use: No    Frequency: Never  . Drug use: No  . Sexual activity: Not Currently  Lifestyle  . Physical activity:    Days per week: 4 days    Minutes per session: 30 min  . Stress: Not at all  Relationships  . Social connections:    Talks on phone: Patient refused     Gets together: Patient refused    Attends religious service: Patient refused    Active member of club or organization: Patient refused    Attends meetings of clubs or organizations: Patient refused    Relationship status: Patient refused  . Intimate partner violence:    Fear of current or ex partner: Patient refused    Emotionally abused: Patient refused    Physically abused: Patient refused    Forced sexual activity: Patient refused  Other Topics Concern  . Not on file  Social History Narrative  . Not on file    FAMILY HISTORY: Family History  Problem Relation Age of Onset  . Colon  cancer Father 84       currently 71  . Colon cancer Maternal Grandfather   . Breast cancer Cousin   . Thyroid cancer Mother 87       facial radiation for acne as teen  . Lymphoma Paternal Aunt 89       deceased 62  . Breast cancer Paternal Aunt     ALLERGIES:  has No Known Allergies.  MEDICATIONS:  Current Outpatient Medications  Medication Sig Dispense Refill  . dronabinol (MARINOL) 2.5 MG capsule Take 1 capsule (2.5 mg total) by mouth 2 (two) times daily before a meal. 40 capsule 0  . loperamide (IMODIUM) 2 MG capsule Take 1 capsule (2 mg total) by mouth as needed for diarrhea or loose stools. 30 capsule 0  . loratadine (CLARITIN) 10 MG tablet Take 10 mg by mouth daily as needed for allergies.    . magic mouthwash w/lidocaine SOLN Take 10 mLs by mouth 3 (three) times daily as needed for mouth pain. Swish and spit 10 ML by mouth 3 times daily as needed for mouth pain. 240 mL 0  . meclizine (ANTIVERT) 25 MG tablet Take 25 mg by mouth 3 (three) times daily as needed for dizziness.    . ondansetron (ZOFRAN-ODT) 8 MG disintegrating tablet TAKE 1 TABLET BY MOUTH EVERY 8 HOURS AS NEEDED FOR NAUSE OR VOMITING 40 tablet 0  . potassium chloride (K-DUR) 10 MEQ tablet Take 10 mEq by mouth 3 (three) times daily.  2  . prochlorperazine (COMPAZINE) 10 MG tablet Take 1 tablet (10 mg total) by mouth every 6  (six) hours as needed for nausea or vomiting. 40 tablet 2  . traMADol (ULTRAM) 50 MG tablet Take 1 tablet (50 mg total) by mouth every 6 (six) hours as needed. (Patient taking differently: Take 50 mg by mouth every 6 (six) hours as needed for moderate pain. ) 30 tablet 0   No current facility-administered medications for this visit.    Facility-Administered Medications Ordered in Other Visits  Medication Dose Route Frequency Provider Last Rate Last Dose  . 0.9 %  sodium chloride infusion   Intravenous Once [name], MD      . fluorouracil (ADRUCIL) 2,950 mg in sodium chloride 0.9 % 91 mL chemo infusion  2,000 mg/m2 (Treatment Plan Recorded) Intravenous 1 day or 1 dose [name], MD      . heparin lock flush 100 unit/mL  500 Units Intracatheter Once PRN [name], MD      . irinotecan LIPOSOME (ONIVYDE) 73.1 mg in sodium chloride 0.9 % 500 mL chemo infusion  50 mg/m2 (Treatment Plan Recorded) Intravenous Once [name], MD      . leucovorin 592 mg in dextrose 5 % 250 mL infusion  400 mg/m2 (Treatment Plan Recorded) Intravenous Once [name], MD      . prochlorperazine (COMPAZINE) tablet 10 mg  10 mg Oral Once [name], MD      . sodium chloride flush (NS) 0.9 % injection 10 mL  10 mL Intracatheter PRN [name], MD        REVIEW OF SYSTEMS:  Constitutional: Denies fevers, chills or abnormal night sweats (+) fatigue (+) weight gain (+) better appetite Eyes: Denies blurriness of vision, double vision or watery eyes Ears, nose, mouth, throat, and face: Denies mucositis or sore throat Respiratory: Denies cough, dyspnea or wheezes Cardiovascular: Denies palpitation, chest discomfort or lower extremity swelling Gastrointestinal:  No nausea constipation (+) diarrhea Skin: Denies abnormal skin rashes, tender  knots down lower legs bilaterally Lymphatics: Denies new lymphadenopathy or easy bruising Neurological:Denies new weaknesses (+) numbness and tingling in extremities, worse in  LL. Behavioral/Psych: Mood is stable, no new changes  All other systems were reviewed with the patient and are negative.  PHYSICAL EXAMINATION:  ECOG PERFORMANCE STATUS: 2 Weight 106 pounds, blood pressure 106/66, heart rate 79, respirate 16, temperature 36.8, pulse ox 100% on room air GENERAL:alert, no distress and comfortable SKIN: skin color, texture, turgor are normal, no rashes or significant lesions EYES: normal, conjunctiva are pink and non-injected, sclera clear OROPHARYNX:no exudate, no erythema and lips, buccal mucosa, and tongue normal  NECK: supple, thyroid normal size, non-tender, without nodularity LYMPH:  no palpable lymphadenopathy in the cervical, axillary or inguinal LUNGS: clear to auscultation and percussion with normal breathing effort HEART: regular rate & rhythm and no murmurs and no lower extremity edema ABDOMEN:abdomen soft, normal bowel sounds. No hepatomegaly or tenderness on palpation (+) small hernia in the left inguial area Musculoskeletal:no cyanosis of digits and no clubbing  PSYCH: alert & oriented x 3 with fluent speech NEURO: no focal motor/sensory deficits  LABORATORY DATA:  I have reviewed the data as listed CBC Latest Ref Rng & Units 02/23/2018 02/10/2018 01/24/2018  WBC 3.9 - 10.3 K/uL 14.1(H) 6.2 4.8  Hemoglobin 11.6 - 15.9 g/dL 8.1(L) 9.1(L) 10.1(L)  Hematocrit 34.8 - 46.6 % 25.2(L) 27.9(L) 29.3(L)  Platelets 145 - 400 K/uL 115(L) 118(L) 131(L)    CMP Latest Ref Rng & Units 02/23/2018 02/10/2018 01/24/2018  Glucose 70 - 99 mg/dL 104(H) 131 105  BUN 6 - 20 mg/dL '13 11 10  '$ Creatinine 0.44 - 1.00 mg/dL 1.26(H) 1.21(H) 1.19(H)  Sodium 135 - 145 mmol/L 142 141 141  Potassium 3.5 - 5.1 mmol/L 3.4(L) 3.5 3.4(L)  Chloride 98 - 111 mmol/L 104 105 105  CO2 22 - 32 mmol/L '29 26 26  '$ Calcium 8.9 - 10.3 mg/dL 9.5 9.2 8.4  Total Protein 6.5 - 8.1 g/dL 7.1 7.3 6.6  Total Bilirubin 0.3 - 1.2 mg/dL 0.6 0.7 0.7  Alkaline Phos 38 - 126 U/L 398(H) 405(H) 354(H)  AST  15 - 41 U/L 100(H) 123(H) 134(H)  ALT 0 - 44 U/L 110(H) 112(H) 104(H)    PATHOLOGY  Liver Biopsy  Diagnosis 08/05/17 Liver, needle/core biopsy - CARCINOMA. - SEE COMMENT. Microscopic Comment The malignant cells are positive for cytokeratin 7. They are negative for arginase, CDX2, cytokeratin 20, estrogen receptor, GATA-3, GCDFP, Glypican 3, Hep Par 1, Napsin A, and TTF-1. This immunohistochemical is nonspecific. Possibly primary sources include pancreatobiliary and upper gastrointestinal. Radiologic correlation is necessary. Of note, organ specific markers (GATA-3, GCDFP-breast, TTF-1, Napsin A-lung, and CDX2-colon) are negative. (JBK:ecj 08/09/2017)   Diagnosis 03/07/13 Surgical, cecum, polyp -TUBULAR ADENOMA -NEGATIVE FOR HIGH GRADE DYSPLASIA   PROCEDURES  Colonoscopy by Dr. Paulita Fujita 03/07/13 IMPRESSION:  -One 5 m polyp in the cecum. Resected and retrieved -The distal rectum and anal verge are normal on retroflexion view.  -The examination was otherwise normal.    RADIOGRAPHIC STUDIES: I have personally reviewed the radiological images as listed and agreed with the findings in the report.   Nm Pet Image Restag (ps) Skull Base To Thigh  Result Date: 02/21/2018 CLINICAL DATA:  Subsequent treatment strategy for metastatic pancreas cancer. EXAM: NUCLEAR MEDICINE PET SKULL BASE TO THIGH TECHNIQUE: 5.3 mCi F-18 FDG was injected intravenously. Full-ring PET imaging was performed from the skull base to thigh after the radiotracer. CT data was obtained and used for attenuation correction  and anatomic localization. Fasting blood glucose: 104 mg/dl COMPARISON:  12/17/2017 FINDINGS: Mediastinal blood pool activity: SUV max 1.65 NECK: No hypermetabolic lymph nodes in the neck. Incidental CT findings: none CHEST: No hypermetabolic mediastinal or hilar lymph nodes. Subpleural nodule within the anterior right upper lobe measures 5 mm and appears new from previous exam. This is too small to  characterize by PET-CT. In the superior segment of right lower lobe there is a new nodule measuring 4 mm. Also too small to characterize by PET-CT. Incidental CT findings: none ABDOMEN/PELVIS: Extensive liver metastases is again identified as noted previously. No residual hypermetabolic activity associated with these lesions. The index lesion in the left lobe of liver measures 4.4 by 3.0 cm, image 100/4. Previously 4.8 x 3.6 cm. No residual abnormal activity within the pancreas. The spleen and adrenal glands are unremarkable. No hypermetabolic lymph nodes within the abdomen or pelvis. No hypermetabolic inguinal adenopathy. Incidental CT findings: Gallstones are again noted. SKELETON: Generalized marrow activity is again noted and is favored to represent treatment related changes. No focal areas of hypermetabolism identified to suggest bone metastases. Incidental CT findings: none IMPRESSION: 1. There are 2 new small nodules identified within the right lung which measure up to 5 mm. These are too small to reliably characterize by PET-CT, but warrant close interval follow-up. 2. Again noted are multifocal liver metastasis. The target lesion within the left lobe is slightly decreased in size when compared with the previous exam. Similar to previous exam there is no abnormal hypermetabolism above background liver activity identified within the liver lesions. 3. Gallstones. Electronically Signed   By: Kerby Moors M.D.   On: 02/21/2018 15:22   02/21/2018 PET Scan  IMPRESSION: 1. There are 2 new small nodules identified within the right lung which measure up to 5 mm. These are too small to reliably characterize by PET-CT, but warrant close interval follow-up. 2. Again noted are multifocal liver metastasis. The target lesion within the left lobe is slightly decreased in size when compared with the previous exam. Similar to previous exam there is no abnormal hypermetabolism above background liver activity  identified within the liver lesions. 3. Gallstones.  PET Scan 12/17/17 IMPRESSION: 1. Although the liver metastases are still visible on the CT images, they are significantly smaller and retain no abnormal metabolic activity, consistent with response to therapy. 2. No abnormal activity within the pancreas. 3. No disease progression identified. 4. Cholelithiasis.  CT CAP WO Contrast 10/14/17 IMPRESSION: Evidence of known numerous liver metastases without significant interval change. No other evidence of metastatic disease within the chest, abdomen or pelvis. Cholelithiasis. Tiny pericardial effusion.   ASSESSMENT & PLAN:  Morgan Dawson is a 57 y.o. caucasian female with a history of vertigo and menieres disease, presented with epigastric discomfort, low appetite and 5 pound weight loss   1. 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  07/29/17 PET scan shows diffuse metastatic disease in her enlarged liver.  This is most consistent with diffuse liver metastasis.  -Her liver biopsy confirmed metastatic adenocarcinoma, most consistent with biliary or pancreatic primary this was discussed with patient.  -After further reviewing her 07/29/17 PET in the GI Tumor Board, the initial uptake in the duodenum is felt to be in the pancrease tail, which represents the primary tumor.  The consensus from GI tumor board is this is most consistent with  metastatic pancreatic adenocarcinoma to liver.  We felt she does not need further imaging or EUS to confirm the primary site.  -Given her metastatic stage IV disease, her metastatic cancer is unfortunately not curable, and the goal of therapy is palliative, to prolong her life and improve her quality of life.  -Goal of therapy is to control the disease.  -she started first line FOLFIRINOX on 08/21/17, -She has  been tolerating chemotherapy moderately well, with some nausea, cold sensitivity, diarrhea, and fatigue, she has developed slightly worsening renal function, has received IV fluids intermittently  -CT CAP WO Contrast from 10/14/17 reveals stable disease, liver metastases without significant interval change. No other evidence of metastatic disease within the chest, abdomen or pelvis..  -Her CA 19.9 continues to decrease, corresponding to her good response to chemotherapy. -her PET scan from 12/17/17 revealed that her liver mets are significantly smaller and retain no abnormal metabolic activity. No new lesions identified. I discussed these results with the pt and her husband. They are very pleased.  -She has developed persistent peripheral neuropathy, grade 2, I stopped her oxaliplatin on 02/10/2018 -I reviewed her restaging PET scan from February 21, 2018, which showed a few new small lung nodules up to 5 mm, not impressive.  No hypermetabolic lesions in the liver or anywhere else. She has had excellent response to chemo so far -Will change her chemo treatment to 5-FU and liposomal irinotecan, every 2 weeks.  Due to her cytopenia, I will reduce the dose -She will go to [place] next Friday and we will give her a chemo break at this time  -she will return in 3 weeks   2. Pancreatitis -Found by 07/23/17 CT scan, likely related to her cancer.  -On bland diet -I previously suggested Prilosec and to continue small meals with bland diet. If needed I can give pain medication. -Abdominal pain is mild, no need for pain medication at this time.    3. Nausea, and diarrhea  -She has had nausea since her cancer diagnosis, she knows to use Zofran and Compazine alternatively for nausea as needed off chemo -She had severe nausea and vomiting towards the end of the chemo infusion cycle 2 -Emend was added with cycle 3, her n/v are overall improved from previous cycles. She had 2 days of mild n/v after pump d/c, managed with  zofran and compazine. She will continue current supportive regimen. Will monitor.  -She has mild diarrhea after chemo, has not needed antidiarrheal medication  4. Anorexia, weight loss -She has anxiety about eating due to emesis.  -I previously encouraged her to eat more small meals with bland or liquid diet. She can increase her intake of ensure boost -Follow-up with dietitian -weight stable lately   5. Hypercalcemia -Secondary to her underlying malignancy -Received Zometa and IV hydration in December 2018, hypercalcemia has resolved now. -Okay to restart dairy products, she is off calcium supplement. -She has cramps as of late, her Ca is 9.9 on 2/25 and 3/18, okay to restart calcium supplement for leg cramps. She can also take magnesium.  6.  Transaminitis -Due to her underlying liver metastasis and chemo  -Improved since she started chemo, slightly worse again lately  -Monitoring  7. Anemia and thrombocytopenia  -She has developed a significant anemia and thrombocytopenia which required blood transfusion in late May 2019 -She has no overt GI bleeding, but I will check her iron level -Hemoglobin 8.1 today, likely will drop further after to cycle chemo.  Given her pending  travel next Friday, I recommend 2 units of blood transfusion in the next few days.  She agrees.  8. Hypokalemia, CKD stage III -She takes 10-40 mEq on a given day depending on her ability to swallow pills. K was 3.0 10/04/17. She does not have heavy GI output. I recommended she take 20 mEq daily consistently.  -Creatinine continues to increase; she appears adequately hydrated. Could be related to chemotherapy. Will check mag to make sure it is normal. Will obtain uric acid to r/o tumor lysis.  -will check labs closely, add on with pump d/c on day 3 -her Cr is 1.97 on (10/18/17), so she received IVF, her K was 3.2, She can increase to 3 potassium supplements for the next 3 days.  -Cr remains presistently high, I stopped  her spironolactone on 11/01/17 - US renal from 11/08/17 revealed hyperechoic renal cortex bilaterally without hydronephrosis -continue KCL supplement   9. Meniere's disease -She had recent meniere's flare with dizziness since last cycle. She is not currently on daily treatment; takes meclizine PRN. I previously discussed with ENT, she was prescribed aldactone. She has been holding due to increased creatinine.   10. Genetics -She previously met with genetic counselor and test was not recommended  -I informed pt today that testing will be beneficial to know if she will be a candidate for other drugs in the future if she is tested positive for Lynch Syndrome or BRCA1/BRCA2 mutation carrier  -Her genetic testing (83 cancer related genes) was negative  11.  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  I suggest her to take a B complex.  12. Goal of care discussion  -We previously discussed the incurable nature of her cancer, and the overall poor prognosis, especially if she does not have good response to chemotherapy or progress on chemo -The patient understands the goal of care is palliative. -I previously recommended DNR/DNI, she will think about it    PLAN:  -Lab reviewed, adequate for treatment, will start first cycle 5-FU and liposomal irinotecan today, with dose reduction due to her cytopenia and upcoming travel next week -2 units of blood transfusion in two days when she returns for pump d/c -lab, flush, and chemo cycle 2 in 3 weeks, will slightly increase her dose for the next cycle.  No orders of the defined types were placed in this encounter.   All questions were answered. The patient knows to call the clinic with any problems, questions or concerns. I spent 20 minutes counseling the patient face to face. The  total time spent in the appointment was 25 minutes and more than 50% was on counseling.  I have reviewed the above documentation for accuracy and completeness, and I agree with the above.  Dierdre Searles Dweik am acting as scribe for Dr. [name].  I have reviewed the above documentation for accuracy and completeness, and I agree with the above.    [name], MD 02/23/2018

## 2018-02-23 ENCOUNTER — Other Ambulatory Visit: Payer: Self-pay

## 2018-02-23 ENCOUNTER — Encounter: Payer: Self-pay | Admitting: Hematology

## 2018-02-23 ENCOUNTER — Inpatient Hospital Stay: Payer: 59 | Attending: Hematology

## 2018-02-23 ENCOUNTER — Telehealth: Payer: Self-pay | Admitting: Hematology

## 2018-02-23 ENCOUNTER — Inpatient Hospital Stay: Payer: 59

## 2018-02-23 ENCOUNTER — Inpatient Hospital Stay (HOSPITAL_BASED_OUTPATIENT_CLINIC_OR_DEPARTMENT_OTHER): Payer: 59 | Admitting: Hematology

## 2018-02-23 ENCOUNTER — Other Ambulatory Visit: Payer: Self-pay | Admitting: Hematology

## 2018-02-23 VITALS — BP 106/66 | HR 79 | Temp 98.3°F | Resp 16 | Ht 65.0 in | Wt 106.5 lb

## 2018-02-23 DIAGNOSIS — R252 Cramp and spasm: Secondary | ICD-10-CM

## 2018-02-23 DIAGNOSIS — D6481 Anemia due to antineoplastic chemotherapy: Secondary | ICD-10-CM

## 2018-02-23 DIAGNOSIS — N183 Chronic kidney disease, stage 3 unspecified: Secondary | ICD-10-CM

## 2018-02-23 DIAGNOSIS — C787 Secondary malignant neoplasm of liver and intrahepatic bile duct: Secondary | ICD-10-CM | POA: Insufficient documentation

## 2018-02-23 DIAGNOSIS — D649 Anemia, unspecified: Secondary | ICD-10-CM

## 2018-02-23 DIAGNOSIS — C251 Malignant neoplasm of body of pancreas: Secondary | ICD-10-CM

## 2018-02-23 DIAGNOSIS — K859 Acute pancreatitis without necrosis or infection, unspecified: Secondary | ICD-10-CM

## 2018-02-23 DIAGNOSIS — C259 Malignant neoplasm of pancreas, unspecified: Secondary | ICD-10-CM

## 2018-02-23 DIAGNOSIS — D696 Thrombocytopenia, unspecified: Secondary | ICD-10-CM

## 2018-02-23 DIAGNOSIS — Z5189 Encounter for other specified aftercare: Secondary | ICD-10-CM | POA: Diagnosis not present

## 2018-02-23 DIAGNOSIS — H8109 Meniere's disease, unspecified ear: Secondary | ICD-10-CM | POA: Insufficient documentation

## 2018-02-23 DIAGNOSIS — R918 Other nonspecific abnormal finding of lung field: Secondary | ICD-10-CM | POA: Insufficient documentation

## 2018-02-23 DIAGNOSIS — R740 Nonspecific elevation of levels of transaminase and lactic acid dehydrogenase [LDH]: Secondary | ICD-10-CM | POA: Insufficient documentation

## 2018-02-23 DIAGNOSIS — G620 Drug-induced polyneuropathy: Secondary | ICD-10-CM | POA: Insufficient documentation

## 2018-02-23 DIAGNOSIS — T451X5A Adverse effect of antineoplastic and immunosuppressive drugs, initial encounter: Secondary | ICD-10-CM

## 2018-02-23 DIAGNOSIS — E876 Hypokalemia: Secondary | ICD-10-CM | POA: Insufficient documentation

## 2018-02-23 DIAGNOSIS — Z7189 Other specified counseling: Secondary | ICD-10-CM

## 2018-02-23 DIAGNOSIS — Z5111 Encounter for antineoplastic chemotherapy: Secondary | ICD-10-CM | POA: Insufficient documentation

## 2018-02-23 LAB — CBC WITH DIFFERENTIAL/PLATELET
BASOS ABS: 0 10*3/uL (ref 0.0–0.1)
Basophils Relative: 0 %
Eosinophils Absolute: 0 10*3/uL (ref 0.0–0.5)
Eosinophils Relative: 0 %
HEMATOCRIT: 25.2 % — AB (ref 34.8–46.6)
HEMOGLOBIN: 8.1 g/dL — AB (ref 11.6–15.9)
LYMPHS PCT: 9 %
Lymphs Abs: 1.2 10*3/uL (ref 0.9–3.3)
MCH: 34.3 pg — ABNORMAL HIGH (ref 25.1–34.0)
MCHC: 32.1 g/dL (ref 31.5–36.0)
MCV: 106.8 fL — AB (ref 79.5–101.0)
MONOS PCT: 9 %
Monocytes Absolute: 1.2 10*3/uL — ABNORMAL HIGH (ref 0.1–0.9)
NEUTROS ABS: 11.6 10*3/uL — AB (ref 1.5–6.5)
NEUTROS PCT: 82 %
Platelets: 115 10*3/uL — ABNORMAL LOW (ref 145–400)
RBC: 2.36 MIL/uL — ABNORMAL LOW (ref 3.70–5.45)
RDW: 19.5 % — ABNORMAL HIGH (ref 11.2–14.5)
WBC: 14.1 10*3/uL — ABNORMAL HIGH (ref 3.9–10.3)

## 2018-02-23 LAB — COMPREHENSIVE METABOLIC PANEL
ALBUMIN: 3.4 g/dL — AB (ref 3.5–5.0)
ALT: 110 U/L — ABNORMAL HIGH (ref 0–44)
ANION GAP: 9 (ref 5–15)
AST: 100 U/L — ABNORMAL HIGH (ref 15–41)
Alkaline Phosphatase: 398 U/L — ABNORMAL HIGH (ref 38–126)
BUN: 13 mg/dL (ref 6–20)
CO2: 29 mmol/L (ref 22–32)
Calcium: 9.5 mg/dL (ref 8.9–10.3)
Chloride: 104 mmol/L (ref 98–111)
Creatinine, Ser: 1.26 mg/dL — ABNORMAL HIGH (ref 0.44–1.00)
GFR calc non Af Amer: 47 mL/min — ABNORMAL LOW (ref >60)
GFR, EST AFRICAN AMERICAN: 55 mL/min — AB (ref >60)
GLUCOSE: 104 mg/dL — AB (ref 70–99)
POTASSIUM: 3.4 mmol/L — AB (ref 3.5–5.1)
SODIUM: 142 mmol/L (ref 135–145)
Total Bilirubin: 0.6 mg/dL (ref 0.3–1.2)
Total Protein: 7.1 g/dL (ref 6.5–8.1)

## 2018-02-23 LAB — LIPASE, BLOOD: Lipase: 52 U/L — ABNORMAL HIGH (ref 11–51)

## 2018-02-23 LAB — PREPARE RBC (CROSSMATCH)

## 2018-02-23 MED ORDER — SODIUM CHLORIDE 0.9 % IV SOLN
73.0000 mg | Freq: Once | INTRAVENOUS | Status: AC
  Administered 2018-02-23: 73 mg via INTRAVENOUS
  Filled 2018-02-23: qty 16.98

## 2018-02-23 MED ORDER — DEXTROSE 5 % IV SOLN
400.0000 mg/m2 | Freq: Once | INTRAVENOUS | Status: AC
  Administered 2018-02-23: 592 mg via INTRAVENOUS
  Filled 2018-02-23: qty 29.6

## 2018-02-23 MED ORDER — SODIUM CHLORIDE 0.9 % IV SOLN
2000.0000 mg/m2 | INTRAVENOUS | Status: DC
  Administered 2018-02-23: 2950 mg via INTRAVENOUS
  Filled 2018-02-23: qty 59

## 2018-02-23 MED ORDER — PROCHLORPERAZINE MALEATE 10 MG PO TABS
ORAL_TABLET | ORAL | Status: AC
  Filled 2018-02-23: qty 1

## 2018-02-23 MED ORDER — PROCHLORPERAZINE MALEATE 10 MG PO TABS
10.0000 mg | ORAL_TABLET | Freq: Once | ORAL | Status: AC
  Administered 2018-02-23: 10 mg via ORAL

## 2018-02-23 MED ORDER — ATROPINE SULFATE 1 MG/ML IJ SOLN
0.5000 mg | Freq: Once | INTRAMUSCULAR | Status: AC
  Administered 2018-02-23: 0.5 mg via INTRAVENOUS

## 2018-02-23 MED ORDER — ATROPINE SULFATE 1 MG/ML IJ SOLN
INTRAMUSCULAR | Status: AC
  Filled 2018-02-23: qty 1

## 2018-02-23 MED ORDER — SODIUM CHLORIDE 0.9% FLUSH
10.0000 mL | INTRAVENOUS | Status: DC | PRN
  Filled 2018-02-23: qty 10

## 2018-02-23 MED ORDER — SODIUM CHLORIDE 0.9 % IV SOLN
Freq: Once | INTRAVENOUS | Status: AC
  Administered 2018-02-23: 10:00:00 via INTRAVENOUS

## 2018-02-23 MED ORDER — HEPARIN SOD (PORK) LOCK FLUSH 100 UNIT/ML IV SOLN
500.0000 [IU] | Freq: Once | INTRAVENOUS | Status: DC | PRN
  Filled 2018-02-23: qty 5

## 2018-02-23 NOTE — Progress Notes (Signed)
Per Dr. Burr Medico, [place] to treat 02/23/18 with AST 100 and ALT 110, Cr 1.26.

## 2018-02-23 NOTE — Progress Notes (Signed)
DISCONTINUE ON PATHWAY REGIMEN - Pancreatic     A cycle is every 14 days:     Oxaliplatin      Leucovorin      Irinotecan      5-Fluorouracil      5-Fluorouracil   **Always confirm dose/schedule in your pharmacy ordering system**  REASON: Toxicities / Adverse Event PRIOR TREATMENT: [id number]: FOLFIRINOX q14 Days x 6 Months TREATMENT RESPONSE: Partial Response (PR)  START OFF PATHWAY REGIMEN - Pancreatic   [id number]:Liposomal Irinotecan 70 mg/m2 + Leucovorin 400 mg/m2 + 5-FU 2400 mg/m2 CIV q14 Days:   A cycle is every 14 days:     Liposomal irinotecan      Leucovorin      5-Fluorouracil   **Always confirm dose/schedule in your pharmacy ordering system**  Patient Characteristics: Adenocarcinoma, Metastatic Disease, Second Line, MSS/pMMR or MSI Unknown, If FOLFIRINOX First Line 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

## 2018-02-23 NOTE — Patient Instructions (Signed)

## 2018-02-23 NOTE — Patient Instructions (Signed)
[facility] Discharge Instructions for Patients Receiving Chemotherapy  Today you received the following chemotherapy agents: Irinotecan Liposome, Leucovorin, 5FU  To help prevent nausea and vomiting after your treatment, we encourage you to take your nausea medication as directed  If you develop nausea and vomiting that is not controlled by your nausea medication, call the clinic.   BELOW ARE SYMPTOMS THAT SHOULD BE REPORTED IMMEDIATELY:  *FEVER GREATER THAN 100.5 F  *CHILLS WITH OR WITHOUT FEVER  NAUSEA AND VOMITING THAT IS NOT CONTROLLED WITH YOUR NAUSEA MEDICATION  *UNUSUAL SHORTNESS OF BREATH  *UNUSUAL BRUISING OR BLEEDING  TENDERNESS IN MOUTH AND THROAT WITH OR WITHOUT PRESENCE OF ULCERS  *URINARY PROBLEMS  *BOWEL PROBLEMS  UNUSUAL RASH Items with * indicate a potential emergency and should be followed up as soon as possible.  Feel free to call the clinic should you have any questions or concerns. The clinic phone number is [phone].  Please show the [facility] at check-in to the Emergency Department and triage nurse.

## 2018-02-23 NOTE — Telephone Encounter (Signed)
No LOS 7/3

## 2018-02-24 ENCOUNTER — Encounter: Payer: Self-pay | Admitting: Hematology

## 2018-02-24 LAB — CANCER ANTIGEN 19-9: CA 19-9: 25 U/mL (ref 0–35)

## 2018-02-25 ENCOUNTER — Inpatient Hospital Stay: Payer: 59

## 2018-02-25 DIAGNOSIS — N183 Chronic kidney disease, stage 3 unspecified: Secondary | ICD-10-CM

## 2018-02-25 DIAGNOSIS — T451X5A Adverse effect of antineoplastic and immunosuppressive drugs, initial encounter: Secondary | ICD-10-CM

## 2018-02-25 DIAGNOSIS — D6481 Anemia due to antineoplastic chemotherapy: Secondary | ICD-10-CM

## 2018-02-25 DIAGNOSIS — C251 Malignant neoplasm of body of pancreas: Secondary | ICD-10-CM

## 2018-02-25 DIAGNOSIS — Z5111 Encounter for antineoplastic chemotherapy: Secondary | ICD-10-CM | POA: Diagnosis not present

## 2018-02-25 MED ORDER — SODIUM CHLORIDE 0.9% FLUSH
10.0000 mL | INTRAVENOUS | Status: DC | PRN
  Filled 2018-02-25: qty 10

## 2018-02-25 MED ORDER — HEPARIN SOD (PORK) LOCK FLUSH 100 UNIT/ML IV SOLN
500.0000 [IU] | Freq: Every day | INTRAVENOUS | Status: DC | PRN
  Filled 2018-02-25: qty 5

## 2018-02-25 MED ORDER — SODIUM CHLORIDE 0.9 % IV SOLN
250.0000 mL | Freq: Once | INTRAVENOUS | Status: AC
  Administered 2018-02-25: 250 mL via INTRAVENOUS

## 2018-02-25 NOTE — Progress Notes (Signed)
42min post VS not placed for second unit of blood by primary RN. Units completed. Pt asymptomatic and VSS upon d/c.

## 2018-02-25 NOTE — Patient Instructions (Signed)

## 2018-02-27 LAB — TYPE AND SCREEN
ABO/RH(D): A POS
Antibody Screen: NEGATIVE
Unit division: 0
Unit division: 0

## 2018-02-27 LAB — BPAM RBC
BLOOD PRODUCT EXPIRATION DATE: 201907262359
Blood Product Expiration Date: 201907262359
ISSUE DATE / TIME: 201907050900
ISSUE DATE / TIME: 201907050900
UNIT TYPE AND RH: 6200
UNIT TYPE AND RH: 6200

## 2018-03-08 ENCOUNTER — Other Ambulatory Visit: Payer: Self-pay

## 2018-03-08 DIAGNOSIS — C251 Malignant neoplasm of body of pancreas: Secondary | ICD-10-CM

## 2018-03-10 NOTE — Progress Notes (Signed)
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 Cholelithiasis.  No secondary signs of acute cholecystitis. 2. Multiple solid liver masses measuring up to 6.5 cm in the left lobe of the liver, evaluation for metastatic disease is recommended. These results will be called to the ordering clinician or representative by the Radiologist Assistant, and communication documented in the PACS or zVision Dashboard.      07/23/2017 Imaging    CT Abdomen W Contrast 07/23/17 IMPRESSION: 1. Widespread metastatic disease throughout the liver. No clear primary malignancy identified in the abdomen. The pelvis was not imaged. Tissue sampling recommended. 2. Probable adenopathy superior to the pancreatic tail. No evidence of pancreatic mass. 3. Suspected incidental hemangioma inferiorly in the right hepatic lobe. 4. Nonspecific nodularity in the breasts. The patient has undergone recent (03/25/2017 and 04/01/2017) mammography and ultrasound.      07/29/2017 Initial Diagnosis    Metastasis to liver of unknown origin ([place])      07/29/2017 PET scan    PET 07/29/17  IMPRESSION: 1. Numerous bulky liver masses are hypermetabolic compatible with malignancy. 2. Accentuated activity within or along the pancreatic tail, likely represent a primary pancreatic tumor. Consider pancreatic protocol MRI to  further work this up. 3. The peripancreatic lymph node shown above the pancreatic tail is mildly hypermetabolic favoring malignancy. 4.  Prominent stool throughout the colon favors constipation. 5. 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      08/05/2017 Pathology Results    Liver Biopsy  Diagnosis 08/05/17 Liver, needle/core biopsy - CARCINOMA. - SEE COMMENT. Microscopic Comment The malignant cells are positive for cytokeratin 7. They are negative for arginase, CDX2, cytokeratin 20, estrogen receptor, GATA-3, GCDFP, Glypican 3, Hep Par 1, Napsin A, and TTF-1. This immunohistochemical is nonspecific. Possibly primary sources include pancreatobiliary and upper gastrointestinal. Radiologic correlation is necessary. Of note, organ specific markers (GATA-3, GCDFP-breast, TTF-1, Napsin A-lung, and CDX2-colon) are negative. (JBK:ecj 08/09/2017)      08/21/2017 -  Chemotherapy    FOLFIRINOX every 2 weeks starting 08/21/17        10/14/2017 Imaging    CT CAP WO Contrast 10/14/17 IMPRESSION: Evidence of known numerous liver metastases without significant interval change. No other evidence of metastatic disease within the chest, abdomen or pelvis. Cholelithiasis. Tiny pericardial effusion.      12/02/2017 Genetic Testing    Negative for pathogenic mutation.  The genes analyzed were the 83 genes on [facility]'s Multi-Cancer panel (ALK, APC, ATM, AXIN2, BAP1, BARD1, BLM, BMPR1A, BRCA1, BRCA2, BRIP1, CASR, CDC73, CDH1, CDK4, CDKN1B, CDKN1C, CDKN2A, CEBPA, CHEK2, CTNNA1, DICER1, DIS3L2, EGFR, EPCAM, FH, FLCN, GATA2, GPC3, GREM1, HOXB13, HRAS, KIT, MAX, MEN1, MET, MITF, MLH1, MSH2, MSH3, MSH6, MUTYH, NBN, NF1, NF2, NTHL1, PALB2, PDGFRA, PHOX2B, PMS2, POLD1, POLE, POT1, PRKAR1A, PTCH1, PTEN, RAD50, RAD51C, RAD51D, RB1, RECQL4, RET, RUNX1, SDHA, SDHAF2, SDHB, SDHC, SDHD, SMAD4, SMARCA4, SMARCB1, SMARCE1, STK11, SUFU, TERC, TERT, TMEM127, TP53, TSC1,  TSC2, VHL, WRN, WT1).      12/17/2017 PET scan     IMPRESSION: 1. Although the liver metastases are still visible on the CT images, they are significantly smaller and retain no abnormal metabolic activity, consistent with response to therapy. 2. No abnormal activity within the pancreas. 3. No disease progression identified. 4. Cholelithiasis.      02/21/2018 PET scan    02/21/2018 PET Scan  IMPRESSION: 1. There are 2 new small nodules identified within the right lung which measure up to 5 mm. These are too small to reliably characterize by PET-CT, but warrant close interval follow-up. 2. Again noted are multifocal liver metastasis. The target lesion within the left lobe is slightly decreased in size when compared with the previous exam. Similar to previous exam there is no abnormal hypermetabolism above background liver activity identified within the liver lesions. 3. Gallstones.      02/22/2018 -  Chemotherapy    5-FU pump infusion and liposomal irinotecan, every 2 weeks.  First cycle dose reduced due to cytopenia in the travel.       Pancreatic cancer metastasized to liver ([place])   08/11/2017 Initial Diagnosis    Pancreatic cancer metastasized to liver [facility])        HISTORY OF PRESENTING ILLNESS: 07/30/17  Morgan Dawson 57 y.o. 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 The patient was referred by her PCP Dr. [name]. The patient presents to the clinic today accompanied by husband.  Today the patient reports the weekend before Thanksgiving she had abdominal issues with bloating and cramps. These symptoms worsened after she would eat. This persisted past Thanksgiving so she went to her PCP.  She presented to her PCP on 07/20/17 for upper abdominal pain, nausea and bloating. She had lab work up and RUQ [place] on 07/23/17. Results showed elevated LFTs (alk phos 227, AST 191, ALT 275) and US showed multiple masses in her liver and pancreatitis. She was put on a bland die due to her  pancreatitis. CT AP in 07/23/17 showed diffuse metastatic disease in her liver with no clear primary. She had a PET scan that shows possible metastasis to peripancreatic lymph node along with her known liver masses.   Today she notes she has focused soreness in RUQ. She feels nauseous and has vomited 3 times total in the past 3 weeks. She has lost weight, initially purposefully. She has lost 5-10 pounds. She has been constipated  lately. She takes dulcolax to help. She denies dark stool or GI bleeding. No hematemesis. She does feel more fatigued. She was relatively active 4-5 days a week before last month. She has not needed medication as she feels she has more discomfort (1-2/10). She has not been exercising because she is afraid of making things worse. Her appetite is low from her discomfort and nausea. She notes multiple tender knots on her lower legs that appeared 5 nights ago. There is no itching. She notes she will follow up with Dr. Paulita Fujita this month for her pancreatitis.   Her last mammogram at the [facility] was 04/01/17 and mostly benign. She was recommended to repeat in 6 months. She notes she frequently has cyst in her breast which were benign before.  Socially she is not working. She usually is active in the gym 3-4 days a week. She has 2 children (son and daughter) at 10yo and 7yo.   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 Her last episode of vertigo was a few months ago. She takes a diuretic and a low salt diet. She has jaw surgery for TMJ issues. She had endometrial ablation due to menorrhagia in 2011. She has only spotted since, no full period. Patient had a colonoscopy in 2014 with one benign polyp removed. Her father had colon cancer in his 67s. Her maternal grandfather had colon cancer in his 46s. Paternal aunt had lymphoma.   CURRENT THERAPY: second line 5-FU pump infusion and liposomal irinotecan every 2 weeks, started on 02/22/2018, dose reduction due to pancytopenia    INTERVAL HISTORY CORDELL COKE is here for a follow up and chemo. She presents to the clinic today by herself. She feels good and had a wonderful vacation. She tolerated last cycle chemotherapy very well. No bleeding or bruising. She had a history of bleeding and bruising in the past and nothing increased after chemo. She takes at least 20 mg potassium pill. She takes her nausea medications as needed.   MEDICAL HISTORY:  Past Medical History:  Diagnosis Date  . Genetic testing 12/02/2017   Multi-Cancer panel (83 genes) @ [facility] - No pathogenic mutations detected  . Meniere disease 2016  . Pancreatitis     SURGICAL HISTORY: Past Surgical History:  Procedure Laterality Date  . CERVICAL ABLATION    . ENDOMETRIAL ABLATION  2011  . IR FLUORO GUIDE PORT INSERTION RIGHT  08/12/2017  . IR US GUIDE VASC ACCESS RIGHT  08/12/2017  . MANDIBLE SURGERY  1999    SOCIAL HISTORY: Social History   Socioeconomic History  . Marital status: Married    Spouse name: Not on file  . Number of children: Not on file  . Years of education: Not on file  . Highest education level: Not on file  Occupational History  . Not on file  Social Needs  . Financial resource strain: Not hard at all  . Food insecurity:    Worry: Never true    Inability: Never true  . Transportation needs:    Medical: No    Non-medical: No  Tobacco Use  . Smoking status: Never Smoker  . Smokeless tobacco: Never Used  Substance and Sexual Activity  . Alcohol use: No    Frequency: Never  . Drug use: No  . Sexual activity: Not Currently  Lifestyle  . Physical activity:    Days per week: 4 days    Minutes per session: 30 min  . Stress: Not at all  Relationships  . Social connections:    Talks on phone: Patient refused    Gets together: Patient refused    Attends religious service: Patient refused    Active member of club or organization: Patient refused    Attends meetings of clubs or organizations: Patient  refused    Relationship status: Patient refused  . Intimate partner violence:    Fear of current or ex partner: Patient refused    Emotionally abused: Patient refused    Physically abused: Patient refused    Forced sexual activity: Patient refused  Other Topics Concern  . Not on file  Social History Narrative  . Not on file    FAMILY HISTORY: Family History  Problem Relation Age of Onset  . Colon cancer Father 73       currently 46  . Colon cancer Maternal Grandfather   . Breast cancer Cousin   . Thyroid cancer Mother 36       facial radiation for acne as  teen  . Lymphoma Paternal Aunt 1       deceased 17  . Breast cancer Paternal Aunt     ALLERGIES:  has No Known Allergies.  MEDICATIONS:  Current Outpatient Medications  Medication Sig Dispense Refill  . loperamide (IMODIUM) 2 MG capsule Take 1 capsule (2 mg total) by mouth as needed for diarrhea or loose stools. 30 capsule 0  . magic mouthwash w/lidocaine SOLN Take 10 mLs by mouth 3 (three) times daily as needed for mouth pain. Swish and spit 10 ML by mouth 3 times daily as needed for mouth pain. 240 mL 0  . meclizine (ANTIVERT) 25 MG tablet Take 25 mg by mouth 3 (three) times daily as needed for dizziness.    . ondansetron (ZOFRAN-ODT) 8 MG disintegrating tablet TAKE 1 TABLET BY MOUTH EVERY 8 HOURS AS NEEDED FOR NAUSE OR VOMITING 40 tablet 0  . potassium chloride (K-DUR) 10 MEQ tablet Take 10 mEq by mouth 3 (three) times daily.  2  . prochlorperazine (COMPAZINE) 10 MG tablet Take 1 tablet (10 mg total) by mouth every 6 (six) hours as needed for nausea or vomiting. 40 tablet 2  . traMADol (ULTRAM) 50 MG tablet Take 1 tablet (50 mg total) by mouth every 6 (six) hours as needed. (Patient taking differently: Take 50 mg by mouth every 6 (six) hours as needed for moderate pain. ) 30 tablet 0  . loratadine (CLARITIN) 10 MG tablet Take 10 mg by mouth daily as needed for allergies.     No current facility-administered medications  for this visit.    Facility-Administered Medications Ordered in Other Visits  Medication Dose Route Frequency Provider Last Rate Last Dose  . fluorouracil (ADRUCIL) 2,650 mg in sodium chloride 0.9 % 97 mL chemo infusion  1,800 mg/m2 (Treatment Plan Recorded) Intravenous 1 day or 1 dose [name], MD      . irinotecan LIPOSOME (ONIVYDE) 73 mg in sodium chloride 0.9 % 500 mL chemo infusion  73 mg Intravenous Once [name], MD 345 mL/hr at 03/15/18 1217 73 mg at 03/15/18 1217  . leucovorin 592 mg in dextrose 5 % 250 mL infusion  400 mg/m2 (Treatment Plan Recorded) Intravenous Once [name], MD        REVIEW OF SYSTEMS:   Constitutional: Denies fevers, chills or abnormal night sweats (+) weight gain (+) better appetite Eyes: Denies blurriness of vision, double vision or watery eyes Ears, nose, mouth, throat, and face: Denies mucositis or sore throat Respiratory: Denies cough, dyspnea or wheezes Cardiovascular: Denies palpitation, chest discomfort or lower extremity swelling Gastrointestinal:  No constipation (+) diarrhea (+) occasional nausea  Skin: Denies abnormal skin rashes, tender knots down lower legs bilaterally Lymphatics: Denies new lymphadenopathy or easy bruising Neurological:Denies new weaknesses (+) numbness and tingling in extremities, worse in LL, improving. Behavioral/Psych: Mood is stable, no new changes  All other systems were reviewed with the patient and are negative.  PHYSICAL EXAMINATION:  ECOG PERFORMANCE STATUS: 1 Today's Vitals   03/15/18 0950 03/15/18 0959  BP: 130/83   Pulse: 74   Resp: 17   Temp: 98.3 F (36.8 C)   TempSrc: Oral   SpO2: 100%   Weight: 108 lb 1.6 oz (49 kg)   Height: '5\' 5"'$  (1.651 m)   PainSc:  0-No pain   GENERAL:alert, no distress and comfortable (+) hair loss SKIN: skin color, texture, turgor are normal, no rashes or significant lesions EYES: normal, conjunctiva are pink and non-injected, sclera clear OROPHARYNX:no exudate, no  erythema and lips, buccal mucosa, and tongue normal  NECK: supple, thyroid normal size, non-tender, without nodularity LYMPH:  no palpable lymphadenopathy in the cervical, axillary or inguinal LUNGS: clear to auscultation and percussion with normal breathing effort HEART: regular rate & rhythm and no murmurs and no lower extremity edema ABDOMEN:abdomen soft, normal bowel sounds. No hepatomegaly or tenderness on palpation (+) small hernia in the left inguial area Musculoskeletal:no cyanosis of digits and no clubbing (+) mild bruising on LLs PSYCH: alert & oriented x 3 with fluent speech NEURO: no focal motor/sensory deficits  LABORATORY DATA:  I have reviewed the data as listed CBC Latest Ref Rng & Units 03/15/2018 02/23/2018 02/10/2018  WBC 3.9 - 10.3 K/uL 2.2(L) 14.1(H) 6.2  Hemoglobin 11.6 - 15.9 g/dL 10.3(L) 8.1(L) 9.1(L)  Hematocrit 34.8 - 46.6 % 31.6(L) 25.2(L) 27.9(L)  Platelets 145 - 400 K/uL 91(L) 115(L) 118(L)    CMP Latest Ref Rng & Units 03/15/2018 02/23/2018 02/10/2018  Glucose 70 - 99 mg/dL 121(H) 104(H) 131  BUN 6 - 20 mg/dL '18 13 11  '$ Creatinine 0.44 - 1.00 mg/dL 1.13(H) 1.26(H) 1.21(H)  Sodium 135 - 145 mmol/L 143 142 141  Potassium 3.5 - 5.1 mmol/L 3.3(L) 3.4(L) 3.5  Chloride 98 - 111 mmol/L 107 104 105  CO2 22 - 32 mmol/L '27 29 26  '$ Calcium 8.9 - 10.3 mg/dL 9.7 9.5 9.2  Total Protein 6.5 - 8.1 g/dL 7.4 7.1 7.3  Total Bilirubin 0.3 - 1.2 mg/dL 0.9 0.6 0.7  Alkaline Phos 38 - 126 U/L 262(H) 398(H) 405(H)  AST 15 - 41 U/L 76(H) 100(H) 123(H)  ALT 0 - 44 U/L 75(H) 110(H) 112(H)   CBC    Component Value Date/Time   WBC 2.2 (L) 03/15/2018 0839   RBC 3.07 (L) 03/15/2018 0839   HGB 10.3 (L) 03/15/2018 0839   HGB 6.6 (LL) 01/12/2018 1130   HGB 11.9 08/23/2017 0951   HCT 31.6 (L) 03/15/2018 0839   HCT 36.0 08/23/2017 0951   PLT 91 (L) 03/15/2018 0839   PLT 31 (L) 01/12/2018 1130   PLT 401 (H) 08/23/2017 0951   MCV 102.9 (H) 03/15/2018 0839   MCV 92.8 08/23/2017 0951    MCH 33.6 03/15/2018 0839   MCHC 32.6 03/15/2018 0839   RDW 18.1 (H) 03/15/2018 0839   RDW 14.2 08/23/2017 0951   LYMPHSABS 0.6 (L) 03/15/2018 0839   LYMPHSABS 0.8 (L) 08/23/2017 0951   MONOABS 0.4 03/15/2018 0839   MONOABS 1.3 (H) 08/23/2017 0951   EOSABS 0.1 03/15/2018 0839   EOSABS 0.1 08/23/2017 0951   BASOSABS 0.0 03/15/2018 0839   BASOSABS 0.0 08/23/2017 0951     Tumor Markers  Results for NELVA, HAUK (MRN [id number]) as of 03/10/2018 14:44  Ref. Range 10/18/2017 09:32 11/15/2017 10:37 12/20/2017 09:33 01/21/2018 09:04 02/23/2018 08:21  CA 19-9 Latest Ref Range: 0 - 35 U/mL 40 (H) 33 28 32 25   Results for ANAHI, BELMAR (MRN [id number]) as of 03/10/2018 14:44  Ref. Range 08/19/2017 14:13  CEA ([facility]) Latest Ref Range: 0.00 - 5.00 ng/mL 1.31    PATHOLOGY 11/22/2017 Molecular Pathology The following genes were evaluated for sequence changes and exonic deletions/duplications: ALK, APC, ATM, AXIN2, BAP1, BARD1, BLM, BMPR1A, BRCA1, BRCA2, BRIP1, CASR, CDC73, CDH1, CDK4, CDKN1B, CDKN1C, CDKN2A (p14ARF), CDKN2A (p16INK4a), CEBPA, CHEK2, CTNNA1, DICER1, DIS3L2, EPCAM*, FH, FLCN, GATA2, GPC3, GREM1*, HRAS, KIT, MAX, MEN1, MET, MLH1, MSH2, MSH3, MSH6, MUTYH, NBN, NF1, NF2, PALB2, PDGFRA, PHOX2B*, PMS2, POLD1, POLE, POT1, PRKAR1A, PTCH1,  PTEN, RAD50, RAD51C, RAD51D, RB1, RECQL4, RET, RUNX1, SDHAF2, SDHB, SDHC, SDHD, SMAD4, SMARCA4, SMARCB1, SMARCE1, STK11, SUFU, TERC, TERT, TMEM127, TP53, TSC1, TSC2, VHL, WRN*, WT1 The following genes were evaluated for sequence changes only: EGFR*, HOXB13*, MITF*, NTHL1*, SDHA Results are negative unless otherwise indicated    Liver Biopsy  Diagnosis 08/05/17 Liver, needle/core biopsy - CARCINOMA. - SEE COMMENT. Microscopic Comment The malignant cells are positive for cytokeratin 7. They are negative for arginase, CDX2, cytokeratin 20, estrogen receptor, GATA-3, GCDFP, Glypican 3, Hep Par 1, Napsin A, and TTF-1. This immunohistochemical  is nonspecific. Possibly primary sources include pancreatobiliary and upper gastrointestinal. Radiologic correlation is necessary. Of note, organ specific markers (GATA-3, GCDFP-breast, TTF-1, Napsin A-lung, and CDX2-colon) are negative. (JBK:ecj 08/09/2017)   Diagnosis 03/07/13 Surgical, cecum, polyp -TUBULAR ADENOMA -NEGATIVE FOR HIGH GRADE DYSPLASIA   PROCEDURES  Colonoscopy by Dr. Paulita Fujita 03/07/13 IMPRESSION:  -One 5 m polyp in the cecum. Resected and retrieved -The distal rectum and anal verge are normal on retroflexion view.  -The examination was otherwise normal.    RADIOGRAPHIC STUDIES: I have personally reviewed the radiological images as listed and agreed with the findings in the report.  Nm Pet Image Restag (ps) Skull Base To Thigh  Result Date: 02/21/2018 CLINICAL DATA:  Subsequent treatment strategy for metastatic pancreas cancer. EXAM: NUCLEAR MEDICINE PET SKULL BASE TO THIGH TECHNIQUE: 5.3 mCi F-18 FDG was injected intravenously. Full-ring PET imaging was performed from the skull base to thigh after the radiotracer. CT data was obtained and used for attenuation correction and anatomic localization. Fasting blood glucose: 104 mg/dl COMPARISON:  12/17/2017 FINDINGS: Mediastinal blood pool activity: SUV max 1.65 NECK: No hypermetabolic lymph nodes in the neck. Incidental CT findings: none CHEST: No hypermetabolic mediastinal or hilar lymph nodes. Subpleural nodule within the anterior right upper lobe measures 5 mm and appears new from previous exam. This is too small to characterize by PET-CT. In the superior segment of right lower lobe there is a new nodule measuring 4 mm. Also too small to characterize by PET-CT. Incidental CT findings: none ABDOMEN/PELVIS: Extensive liver metastases is again identified as noted previously. No residual hypermetabolic activity associated with these lesions. The index lesion in the left lobe of liver measures 4.4 by 3.0 cm, image 100/4. Previously  4.8 x 3.6 cm. No residual abnormal activity within the pancreas. The spleen and adrenal glands are unremarkable. No hypermetabolic lymph nodes within the abdomen or pelvis. No hypermetabolic inguinal adenopathy. Incidental CT findings: Gallstones are again noted. SKELETON: Generalized marrow activity is again noted and is favored to represent treatment related changes. No focal areas of hypermetabolism identified to suggest bone metastases. Incidental CT findings: none IMPRESSION: 1. There are 2 new small nodules identified within the right lung which measure up to 5 mm. These are too small to reliably characterize by PET-CT, but warrant close interval follow-up. 2. Again noted are multifocal liver metastasis. The target lesion within the left lobe is slightly decreased in size when compared with the previous exam. Similar to previous exam there is no abnormal hypermetabolism above background liver activity identified within the liver lesions. 3. Gallstones. Electronically Signed   By: Kerby Moors M.D.   On: 02/21/2018 15:22   02/21/2018 PET Scan  IMPRESSION: 1. There are 2 new small nodules identified within the right lung which measure up to 5 mm. These are too small to reliably characterize by PET-CT, but warrant close interval follow-up. 2. Again noted are multifocal liver metastasis. The target lesion within  the left lobe is slightly decreased in size when compared with the previous exam. Similar to previous exam there is no abnormal hypermetabolism above background liver activity identified within the liver lesions. 3. Gallstones.  PET Scan 12/17/17 IMPRESSION: 1. Although the liver metastases are still visible on the CT images, they are significantly smaller and retain no abnormal metabolic activity, consistent with response to therapy. 2. No abnormal activity within the pancreas. 3. No disease progression identified. 4. Cholelithiasis.  CT CAP WO Contrast 10/14/17 IMPRESSION: Evidence  of known numerous liver metastases without significant interval change. No other evidence of metastatic disease within the chest, abdomen or pelvis. Cholelithiasis. Tiny pericardial effusion.   ASSESSMENT & PLAN:  CORTLYNN HOLLINSWORTH is a 57 y.o. caucasian female with a history of vertigo and menieres disease, presented with epigastric discomfort, low appetite and 5 pound weight loss   1. 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  07/29/17 PET scan shows diffuse metastatic disease in her enlarged liver.  This is most consistent with diffuse liver metastasis.  -Her liver biopsy confirmed metastatic adenocarcinoma, most consistent with biliary or pancreatic primary this was discussed with patient.  -After further reviewing her 07/29/17 PET in the GI Tumor Board, the initial uptake in the duodenum is felt to be in the pancrease tail, which represents the primary tumor.  The consensus from GI tumor board is this is most consistent with metastatic pancreatic adenocarcinoma to liver.  We felt she does not need further imaging or EUS to confirm the primary site.  -Given her metastatic stage IV disease, her metastatic cancer is unfortunately not curable, and the goal of therapy is palliative, to prolong her life and improve her quality of life.  -Goal of therapy is to control the disease.  -she started first line FOLFIRINOX on 08/21/17, -She has been tolerating chemotherapy moderately well, with some nausea, cold sensitivity, diarrhea, and fatigue, she has developed slightly worsening renal function, has received IV fluids intermittently  -CT CAP WO Contrast from 10/14/17 reveals stable disease, liver metastases without significant interval change. No other evidence of metastatic disease within the chest, abdomen or pelvis..  -Her CA 19.9 continues to decrease, corresponding  to her good response to chemotherapy. -her PET scan from 12/17/17 revealed that her liver mets are significantly smaller and retain no abnormal metabolic activity. No new lesions identified. I discussed these results with the pt and her husband. They are very pleased.  -She has developed persistent peripheral neuropathy, grade 2, I stopped her oxaliplatin on 02/10/2018 -I reviewed her restaging PET scan from February 21, 2018, which showed a few new small lung nodules up to 5 mm, not impressive.  No hypermetabolic lesions in the liver or anywhere else. She has had excellent response to chemo so far -her treatment was switched to 5-FU pump infusion and liposomal irinotecan, every 2 weeks.  Due to her cytopenia, I reduced the dose, she tolerated first cycle very well. -Lab reviewed, she still has mild neutropenia with ANC 1.2, mild thrombocytopenia with platelet 91,000, no clinical bleeding.  I will proceed second cycle 5-FU and liposomal irinotecan, with further dose reduction of 5-FU due to her thrombocytopenia.  I will add on Neulasta on day 3. -We again discussed with the goal of therapy is palliative, for disease control and to prolong her life.  She feels very well, understands we will continue  chemotherapy as long as it is working and she is tolerating.   2. Pancreatitis -Found by 07/23/17 CT scan, likely related to her cancer.  -On bland diet -I previously suggested Prilosec and to continue small meals with bland diet. If needed I can give pain medication. -Abdominal pain is mild, no need for pain medication at this time.    3. Nausea, and diarrhea  -She has had nausea since her cancer diagnosis, she knows to use Zofran and Compazine alternatively for nausea as needed off chemo -She had severe nausea and vomiting towards the end of the chemo infusion cycle 2 -Emend was added with cycle 3, her n/v are overall improved from previous cycles. She had 2 days of mild n/v after pump d/c, managed with  zofran and compazine. She will continue current supportive regimen. Will monitor.  -She has mild diarrhea after chemo, has not needed antidiarrheal medication  4. Anorexia, weight loss -She has anxiety about eating due to emesis.  -I previously encouraged her to eat more small meals with bland or liquid diet. She can increase her intake of ensure boost -Follow-up with dietitian -weight stable lately   5. Hypercalcemia -Secondary to her underlying malignancy -Received Zometa and IV hydration in December 2018, hypercalcemia has resolved now. -Okay to restart dairy products, she is off calcium supplement. -She has cramps as of late, her Ca is 9.9 on 2/25 and 3/18, okay to restart calcium supplement for leg cramps. She can also take magnesium.  6.  Transaminitis -Due to her underlying liver metastasis and chemo  -Improved since she started chemo, slightly worse again lately  -Monitoring  7. 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 Hypokalemia, CKD stage III -She takes 10-40 mEq on a given day depending on her ability to swallow pills. K was 3.0 10/04/17. She does not have heavy GI output. I recommended she take 20 mEq daily consistently.  -Creatinine continues to increase; she appears adequately hydrated. Could be related to chemotherapy. Will check mag to make sure it is normal. Will obtain uric acid to r/o tumor lysis.  -will check labs closely, add on with pump d/c on day 3 -her Cr is 1.97 on (10/18/17), so she received IVF, her K was 3.2, She can increase to 3 potassium supplements for the next 3 days.  -Cr remains presistently high, I stopped her spironolactone on 11/01/17 - US renal from 11/08/17 revealed  hyperechoic renal cortex bilaterally without hydronephrosis -continue KCL supplement, she takes 30 mEq daily  9. Meniere's disease -She had recent meniere's flare with dizziness since last cycle. She is not currently on daily treatment; takes meclizine PRN. I previously discussed with ENT, she was prescribed aldactone. She has been holding due to increased creatinine.   10. Genetics -She previously met with genetic counselor and test was not recommended  -I informed pt today that testing will be beneficial to know if she will be a candidate for other drugs in the future if she is tested positive for Lynch Syndrome or BRCA1/BRCA2 mutation carrier  -Her genetic testing (83 cancer related genes) was negative  11.  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  I previously suggested to her to take a B complex.  12. Goal of care discussion  -We previously discussed the incurable nature of her cancer, and the overall poor prognosis, especially if she does not have good response to chemotherapy or progress on chemo -The patient understands the goal of care is palliative. -I previously recommended DNR/DNI, she will think about it    PLAN:  -Lab reviewed, adequate for treatment, will proceed to second cycle 5-FU and liposomal urine taken, with further dose reduction on 5-FU due to her thrombocytopenia, and add Neulasta back on day 3 due to her neutropenia -Lab, flush, follow-up in the treatment in 2 weeks  No orders of the defined types were placed in this encounter.   All questions were answered. The patient knows to call the clinic with any problems, questions or concerns. I spent 25 minutes counseling the patient face to face. The total time spent in the appointment was 30 minutes and more than 50% was on counseling.  I  have reviewed the above documentation for accuracy and completeness, and I agree with the above.  Dierdre Searles Dweik am acting as scribe for Dr. [name].  I have reviewed the above documentation for accuracy and completeness, and I agree with the above.    [name], MD 03/15/2018

## 2018-03-11 ENCOUNTER — Other Ambulatory Visit: Payer: Self-pay

## 2018-03-11 DIAGNOSIS — C251 Malignant neoplasm of body of pancreas: Secondary | ICD-10-CM

## 2018-03-15 ENCOUNTER — Telehealth: Payer: Self-pay | Admitting: Hematology

## 2018-03-15 ENCOUNTER — Inpatient Hospital Stay (HOSPITAL_BASED_OUTPATIENT_CLINIC_OR_DEPARTMENT_OTHER): Payer: 59 | Admitting: Hematology

## 2018-03-15 ENCOUNTER — Inpatient Hospital Stay: Payer: 59

## 2018-03-15 ENCOUNTER — Encounter: Payer: Self-pay | Admitting: Hematology

## 2018-03-15 VITALS — BP 130/83 | HR 74 | Temp 98.3°F | Resp 17 | Ht 65.0 in | Wt 108.1 lb

## 2018-03-15 DIAGNOSIS — N183 Chronic kidney disease, stage 3 (moderate): Secondary | ICD-10-CM

## 2018-03-15 DIAGNOSIS — C787 Secondary malignant neoplasm of liver and intrahepatic bile duct: Principal | ICD-10-CM

## 2018-03-15 DIAGNOSIS — R110 Nausea: Secondary | ICD-10-CM

## 2018-03-15 DIAGNOSIS — R197 Diarrhea, unspecified: Secondary | ICD-10-CM

## 2018-03-15 DIAGNOSIS — H8109 Meniere's disease, unspecified ear: Secondary | ICD-10-CM

## 2018-03-15 DIAGNOSIS — Z807 Family history of other malignant neoplasms of lymphoid, hematopoietic and related tissues: Secondary | ICD-10-CM

## 2018-03-15 DIAGNOSIS — Z800 Family history of malignant neoplasm of digestive organs: Secondary | ICD-10-CM

## 2018-03-15 DIAGNOSIS — T451X5A Adverse effect of antineoplastic and immunosuppressive drugs, initial encounter: Secondary | ICD-10-CM

## 2018-03-15 DIAGNOSIS — C259 Malignant neoplasm of pancreas, unspecified: Secondary | ICD-10-CM

## 2018-03-15 DIAGNOSIS — D61810 Antineoplastic chemotherapy induced pancytopenia: Secondary | ICD-10-CM | POA: Diagnosis not present

## 2018-03-15 DIAGNOSIS — Z7189 Other specified counseling: Secondary | ICD-10-CM

## 2018-03-15 DIAGNOSIS — K859 Acute pancreatitis without necrosis or infection, unspecified: Secondary | ICD-10-CM

## 2018-03-15 DIAGNOSIS — R740 Nonspecific elevation of levels of transaminase and lactic acid dehydrogenase [LDH]: Secondary | ICD-10-CM

## 2018-03-15 DIAGNOSIS — C251 Malignant neoplasm of body of pancreas: Secondary | ICD-10-CM

## 2018-03-15 DIAGNOSIS — G620 Drug-induced polyneuropathy: Secondary | ICD-10-CM

## 2018-03-15 DIAGNOSIS — E876 Hypokalemia: Secondary | ICD-10-CM

## 2018-03-15 DIAGNOSIS — Z803 Family history of malignant neoplasm of breast: Secondary | ICD-10-CM

## 2018-03-15 DIAGNOSIS — Z95828 Presence of other vascular implants and grafts: Secondary | ICD-10-CM

## 2018-03-15 DIAGNOSIS — Z5111 Encounter for antineoplastic chemotherapy: Secondary | ICD-10-CM | POA: Diagnosis not present

## 2018-03-15 LAB — COMPREHENSIVE METABOLIC PANEL
ALT: 75 U/L — ABNORMAL HIGH (ref 0–44)
ANION GAP: 9 (ref 5–15)
AST: 76 U/L — ABNORMAL HIGH (ref 15–41)
Albumin: 3.4 g/dL — ABNORMAL LOW (ref 3.5–5.0)
Alkaline Phosphatase: 262 U/L — ABNORMAL HIGH (ref 38–126)
BUN: 18 mg/dL (ref 6–20)
CHLORIDE: 107 mmol/L (ref 98–111)
CO2: 27 mmol/L (ref 22–32)
Calcium: 9.7 mg/dL (ref 8.9–10.3)
Creatinine, Ser: 1.13 mg/dL — ABNORMAL HIGH (ref 0.44–1.00)
GFR, EST NON AFRICAN AMERICAN: 54 mL/min — AB (ref >60)
Glucose, Bld: 121 mg/dL — ABNORMAL HIGH (ref 70–99)
POTASSIUM: 3.3 mmol/L — AB (ref 3.5–5.1)
Sodium: 143 mmol/L (ref 135–145)
TOTAL PROTEIN: 7.4 g/dL (ref 6.5–8.1)
Total Bilirubin: 0.9 mg/dL (ref 0.3–1.2)

## 2018-03-15 LAB — CBC WITH DIFFERENTIAL/PLATELET
Basophils Absolute: 0 10*3/uL (ref 0.0–0.1)
Basophils Relative: 1 %
EOS ABS: 0.1 10*3/uL (ref 0.0–0.5)
Eosinophils Relative: 3 %
HEMATOCRIT: 31.6 % — AB (ref 34.8–46.6)
HEMOGLOBIN: 10.3 g/dL — AB (ref 11.6–15.9)
LYMPHS ABS: 0.6 10*3/uL — AB (ref 0.9–3.3)
LYMPHS PCT: 25 %
MCH: 33.6 pg (ref 25.1–34.0)
MCHC: 32.6 g/dL (ref 31.5–36.0)
MCV: 102.9 fL — AB (ref 79.5–101.0)
Monocytes Absolute: 0.4 10*3/uL (ref 0.1–0.9)
Monocytes Relative: 19 %
NEUTROS PCT: 52 %
Neutro Abs: 1.2 10*3/uL — ABNORMAL LOW (ref 1.5–6.5)
Platelets: 91 10*3/uL — ABNORMAL LOW (ref 145–400)
RBC: 3.07 MIL/uL — ABNORMAL LOW (ref 3.70–5.45)
RDW: 18.1 % — ABNORMAL HIGH (ref 11.2–14.5)
WBC: 2.2 10*3/uL — ABNORMAL LOW (ref 3.9–10.3)

## 2018-03-15 MED ORDER — DEXTROSE 5 % IV SOLN
400.0000 mg/m2 | Freq: Once | INTRAVENOUS | Status: AC
  Administered 2018-03-15: 592 mg via INTRAVENOUS
  Filled 2018-03-15: qty 29.6

## 2018-03-15 MED ORDER — SODIUM CHLORIDE 0.9 % IV SOLN
73.0000 mg | Freq: Once | INTRAVENOUS | Status: AC
  Administered 2018-03-15: 73 mg via INTRAVENOUS
  Filled 2018-03-15: qty 16.98

## 2018-03-15 MED ORDER — SODIUM CHLORIDE 0.9 % IV SOLN
Freq: Once | INTRAVENOUS | Status: AC
  Administered 2018-03-15: 11:00:00 via INTRAVENOUS

## 2018-03-15 MED ORDER — SODIUM CHLORIDE 0.9 % IV SOLN
1800.0000 mg/m2 | INTRAVENOUS | Status: DC
  Administered 2018-03-15: 2650 mg via INTRAVENOUS
  Filled 2018-03-15: qty 53

## 2018-03-15 MED ORDER — SODIUM CHLORIDE 0.9% FLUSH
10.0000 mL | Freq: Once | INTRAVENOUS | Status: AC
  Administered 2018-03-15: 10 mL
  Filled 2018-03-15: qty 10

## 2018-03-15 MED ORDER — PROCHLORPERAZINE MALEATE 10 MG PO TABS
ORAL_TABLET | ORAL | Status: AC
  Filled 2018-03-15: qty 1

## 2018-03-15 MED ORDER — PROCHLORPERAZINE MALEATE 10 MG PO TABS
10.0000 mg | ORAL_TABLET | Freq: Once | ORAL | Status: AC
  Administered 2018-03-15: 10 mg via ORAL

## 2018-03-15 NOTE — Progress Notes (Signed)
Per Dr. Burr Medico okay to treat with current CBC results, dose is decreased, informed Amy RN Charge in Infusion.

## 2018-03-15 NOTE — Telephone Encounter (Signed)
Gave patient avs and calendar of upcoming appts.  °

## 2018-03-15 NOTE — Patient Instructions (Signed)
[name] Discharge Instructions for Patients Receiving Chemotherapy  Today you received the following chemotherapy agents Irinotecan,leucovorin,Adrucil  To help prevent nausea and vomiting after your treatment, we encourage you to take your nausea medication as directed   If you develop nausea and vomiting that is not controlled by your nausea medication, call the clinic.   BELOW ARE SYMPTOMS THAT SHOULD BE REPORTED IMMEDIATELY:  *FEVER GREATER THAN 100.5 F  *CHILLS WITH OR WITHOUT FEVER  NAUSEA AND VOMITING THAT IS NOT CONTROLLED WITH YOUR NAUSEA MEDICATION  *UNUSUAL SHORTNESS OF BREATH  *UNUSUAL BRUISING OR BLEEDING  TENDERNESS IN MOUTH AND THROAT WITH OR WITHOUT PRESENCE OF ULCERS  *URINARY PROBLEMS  *BOWEL PROBLEMS  UNUSUAL RASH Items with * indicate a potential emergency and should be followed up as soon as possible.  Feel free to call the clinic should you have any questions or concerns. The clinic phone number is [phone].  Please show the CHEMO ALERT CARD at check-in to the Emergency Department and triage nurse.   

## 2018-03-16 ENCOUNTER — Other Ambulatory Visit: Payer: Self-pay | Admitting: Nurse Practitioner

## 2018-03-16 DIAGNOSIS — E876 Hypokalemia: Secondary | ICD-10-CM

## 2018-03-16 MED ORDER — POTASSIUM CHLORIDE ER 10 MEQ PO TBCR: 10.0000 meq | EXTENDED_RELEASE_TABLET | Freq: Three times a day (TID) | ORAL | 1 refills | Status: DC

## 2018-03-17 ENCOUNTER — Inpatient Hospital Stay: Payer: 59

## 2018-03-17 VITALS — BP 128/68 | HR 72 | Temp 98.3°F | Resp 17

## 2018-03-17 DIAGNOSIS — Z5111 Encounter for antineoplastic chemotherapy: Secondary | ICD-10-CM | POA: Diagnosis not present

## 2018-03-17 DIAGNOSIS — C251 Malignant neoplasm of body of pancreas: Secondary | ICD-10-CM

## 2018-03-17 DIAGNOSIS — C787 Secondary malignant neoplasm of liver and intrahepatic bile duct: Principal | ICD-10-CM

## 2018-03-17 DIAGNOSIS — Z7189 Other specified counseling: Secondary | ICD-10-CM

## 2018-03-17 DIAGNOSIS — C259 Malignant neoplasm of pancreas, unspecified: Secondary | ICD-10-CM

## 2018-03-17 MED ORDER — PEGFILGRASTIM INJECTION 6 MG/0.6ML ~~LOC~~
6.0000 mg | PREFILLED_SYRINGE | Freq: Once | SUBCUTANEOUS | Status: AC
  Administered 2018-03-17: 6 mg via SUBCUTANEOUS

## 2018-03-17 MED ORDER — HEPARIN SOD (PORK) LOCK FLUSH 100 UNIT/ML IV SOLN
500.0000 [IU] | Freq: Once | INTRAVENOUS | Status: AC | PRN
  Administered 2018-03-17: 500 [IU]
  Filled 2018-03-17: qty 5

## 2018-03-17 MED ORDER — PEGFILGRASTIM INJECTION 6 MG/0.6ML ~~LOC~~
PREFILLED_SYRINGE | SUBCUTANEOUS | Status: AC
Start: 2018-03-17 — End: ?
  Filled 2018-03-17: qty 0.6

## 2018-03-17 MED ORDER — SODIUM CHLORIDE 0.9% FLUSH
10.0000 mL | INTRAVENOUS | Status: DC | PRN
  Administered 2018-03-17: 10 mL
  Filled 2018-03-17: qty 10

## 2018-03-21 ENCOUNTER — Other Ambulatory Visit: Payer: Self-pay | Admitting: Emergency Medicine

## 2018-03-21 DIAGNOSIS — E876 Hypokalemia: Secondary | ICD-10-CM

## 2018-03-21 MED ORDER — POTASSIUM CHLORIDE ER 10 MEQ PO TBCR: 10.0000 meq | EXTENDED_RELEASE_TABLET | Freq: Three times a day (TID) | ORAL | 1 refills | Status: DC

## 2018-03-23 DIAGNOSIS — C259 Malignant neoplasm of pancreas, unspecified: Secondary | ICD-10-CM | POA: Diagnosis not present

## 2018-03-24 NOTE — Progress Notes (Signed)
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 Cholelithiasis.  No secondary signs of acute cholecystitis. 2. Multiple solid liver masses measuring up to 6.5 cm in the left lobe of the liver, evaluation for metastatic disease is recommended. These results will be called to the ordering clinician or representative by the Radiologist Assistant, and communication documented in the PACS or zVision Dashboard.      07/23/2017 Imaging    CT Abdomen W Contrast 07/23/17 IMPRESSION: 1. Widespread metastatic disease throughout the liver. No clear primary malignancy identified in the abdomen. The pelvis was not imaged. Tissue sampling recommended. 2. Probable adenopathy superior to the pancreatic tail. No evidence of pancreatic mass. 3. Suspected incidental hemangioma inferiorly in the right hepatic lobe. 4. Nonspecific nodularity in the breasts. The patient has undergone recent (03/25/2017 and 04/01/2017) mammography and ultrasound.      07/29/2017 Initial Diagnosis    Metastasis to liver of unknown origin ([place])      07/29/2017 PET scan    PET 07/29/17  IMPRESSION: 1. Numerous bulky liver masses are hypermetabolic compatible with malignancy. 2. Accentuated activity within or along the pancreatic tail, likely represent a primary pancreatic tumor. Consider pancreatic protocol MRI to further  work this up. 3. The peripancreatic lymph node shown above the pancreatic tail is mildly hypermetabolic favoring malignancy. 4.  Prominent stool throughout the colon favors constipation. 5. 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      08/05/2017 Pathology Results    Liver Biopsy  Diagnosis 08/05/17 Liver, needle/core biopsy - CARCINOMA. - SEE COMMENT. Microscopic Comment The malignant cells are positive for cytokeratin 7. They are negative for arginase, CDX2, cytokeratin 20, estrogen receptor, GATA-3, GCDFP, Glypican 3, Hep Par 1, Napsin A, and TTF-1. This immunohistochemical is nonspecific. Possibly primary sources include pancreatobiliary and upper gastrointestinal. Radiologic correlation is necessary. Of note, organ specific markers (GATA-3, GCDFP-breast, TTF-1, Napsin A-lung, and CDX2-colon) are negative. (JBK:ecj 08/09/2017)      08/21/2017 -  Chemotherapy    FOLFIRINOX every 2 weeks starting 08/21/17        10/14/2017 Imaging    CT CAP WO Contrast 10/14/17 IMPRESSION: Evidence of known numerous liver metastases without significant interval change. No other evidence of metastatic disease within the chest, abdomen or pelvis. Cholelithiasis. Tiny pericardial effusion.      12/02/2017 Genetic Testing    Negative for pathogenic mutation.  The genes analyzed were the 83 genes on [facility]'s Multi-Cancer panel (ALK, APC, ATM, AXIN2, BAP1, BARD1, BLM, BMPR1A, BRCA1, BRCA2, BRIP1, CASR, CDC73, CDH1, CDK4, CDKN1B, CDKN1C, CDKN2A, CEBPA, CHEK2, CTNNA1, DICER1, DIS3L2, EGFR, EPCAM, FH, FLCN, GATA2, GPC3, GREM1, HOXB13, HRAS, KIT, MAX, MEN1, MET, MITF, MLH1, MSH2, MSH3, MSH6, MUTYH, NBN, NF1, NF2, NTHL1, PALB2, PDGFRA, PHOX2B, PMS2, POLD1, POLE, POT1, PRKAR1A, PTCH1, PTEN, RAD50, RAD51C, RAD51D, RB1, RECQL4, RET, RUNX1, SDHA, SDHAF2, SDHB, SDHC, SDHD, SMAD4, SMARCA4, SMARCB1, SMARCE1, STK11, SUFU, TERC, TERT, TMEM127, TP53, TSC1,  TSC2, VHL, WRN, WT1).      12/17/2017 PET scan     IMPRESSION: 1. Although the liver metastases are still visible on the CT images, they are significantly smaller and retain no abnormal metabolic activity, consistent with response to therapy. 2. No abnormal activity within the pancreas. 3. No disease progression identified. 4. Cholelithiasis.      02/21/2018 PET scan    02/21/2018 PET Scan  IMPRESSION: 1. There are 2 new small nodules identified within the right lung which measure up to 5 mm. These are too small to reliably characterize by PET-CT, but warrant close interval follow-up. 2. Again noted are multifocal liver metastasis. The target lesion within the left lobe is slightly decreased in size when compared with the previous exam. Similar to previous exam there is no abnormal hypermetabolism above background liver activity identified within the liver lesions. 3. Gallstones.      02/22/2018 -  Chemotherapy    5-FU pump infusion and liposomal irinotecan, every 2 weeks.  First cycle dose reduced due to cytopenia in the travel.       Pancreatic cancer metastasized to liver ([place])   08/11/2017 Initial Diagnosis    Pancreatic cancer metastasized to liver [facility])        HISTORY OF PRESENTING ILLNESS: 07/30/17  Morgan Dawson 57 y.o. 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 The patient was referred by her PCP Dr. [name]. The patient presents to the clinic today accompanied by husband.  Today the patient reports the weekend before Thanksgiving she had abdominal issues with bloating and cramps. These symptoms worsened after she would eat. This persisted past Thanksgiving so she went to her PCP.  She presented to her PCP on 07/20/17 for upper abdominal pain, nausea and bloating. She had lab work up and RUQ [place] on 07/23/17. Results showed elevated LFTs (alk phos 227, AST 191, ALT 275) and US showed multiple masses in her liver and pancreatitis. She was put on a bland die due to her  pancreatitis. CT AP in 07/23/17 showed diffuse metastatic disease in her liver with no clear primary. She had a PET scan that shows possible metastasis to peripancreatic lymph node along with her known liver masses.   Today she notes she has focused soreness in RUQ. She feels nauseous and has vomited 3 times total in the past 3 weeks. She has lost weight, initially purposefully. She has lost 5-10 pounds. She has been constipated  lately. She takes dulcolax to help. She denies dark stool or GI bleeding. No hematemesis. She does feel more fatigued. She was relatively active 4-5 days a week before last month. She has not needed medication as she feels she has more discomfort (1-2/10). She has not been exercising because she is afraid of making things worse. Her appetite is low from her discomfort and nausea. She notes multiple tender knots on her lower legs that appeared 5 nights ago. There is no itching. She notes she will follow up with Dr. Paulita Fujita this month for her pancreatitis.   Her last mammogram at the [facility] was 04/01/17 and mostly benign. She was recommended to repeat in 6 months. She notes she frequently has cyst in her breast which were benign before.  Socially she is not working. She usually is active in the gym 3-4 days a week. She has 2 children (son and daughter) at 10yo and 7yo.   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 Her last episode of vertigo was a few months ago. She takes a diuretic and a low salt diet. She has jaw surgery for TMJ issues. She had endometrial ablation due to menorrhagia in 2011. She has only spotted since, no full period. Patient had a colonoscopy in 2014 with one benign polyp removed. Her father had colon cancer in his 47s. Her maternal grandfather had colon cancer in his 51s. Paternal aunt had lymphoma.   CURRENT THERAPY: second line 5-FU pump infusion and liposomal irinotecan every 2 weeks, started on 02/22/2018, dose reduction due to pancytopenia and  neuropathy   INTERVAL HISTORY Morgan Dawson is here for a follow up and chemo. She presents to the clinic today with her husband. She tolerated last cycle well with minimal side effects. She still has diarrhea, but it is very mild and controled very well. She recovers from chemo in 1 day. Her weight is stable and is eating well and drinking enough fluids. She is worried about her hair loss. She has not seen a dentist in a year now, and is concerned about her oral health and hygiene. She also reports that her neuropathy in all her extremities. She feels numbness and tingling and complains that she doesn't feel the small things that she holds in her hands and sometimes doesn't feel her feet when she walks. She woke up last week at night bothered by the neuropathy.   She is excited about bathroom remodeling at home.   MEDICAL HISTORY:  Past Medical History:  Diagnosis Date  . Genetic testing 12/02/2017   Multi-Cancer panel (83 genes) @ [facility] - No pathogenic mutations detected  . Meniere disease 2016  . Pancreatitis     SURGICAL HISTORY: Past Surgical History:  Procedure Laterality Date  . CERVICAL ABLATION    . ENDOMETRIAL ABLATION  2011  . IR FLUORO GUIDE PORT INSERTION RIGHT  08/12/2017  . IR US GUIDE VASC ACCESS RIGHT  08/12/2017  . MANDIBLE SURGERY  1999    SOCIAL HISTORY: Social History   Socioeconomic History  . Marital status: Married    Spouse name: Not on file  . Number of children: Not on file  . Years of education: Not on file  . Highest education level: Not on file  Occupational History  . Not on file  Social Needs  . Financial resource strain: Not hard at all  . Food insecurity:    Worry: Never true    Inability: Never true  . Transportation needs:    Medical: No    Non-medical: No  Tobacco Use  . Smoking status: Never Smoker  . Smokeless tobacco: Never Used  Substance and Sexual Activity  . Alcohol use: No    Frequency: Never  . Drug use: No  .  Sexual activity: Not Currently  Lifestyle  . Physical activity:    Days per week: 4 days    Minutes per session: 30 min  . Stress: Not at all  Relationships  . Social connections:    Talks on phone: Patient refused    Gets together: Patient refused    Attends religious service: Patient refused    Active member of club or organization: Patient refused    Attends meetings of clubs or organizations: Patient refused    Relationship status: Patient refused  . Intimate partner violence:    Fear of current or ex partner: Patient refused    Emotionally abused: Patient refused    Physically abused: Patient refused  Forced sexual activity: Patient refused  Other Topics Concern  . Not on file  Social History Narrative  . Not on file    FAMILY HISTORY: Family History  Problem Relation Age of Onset  . Colon cancer Father 60       currently 43  . Colon cancer Maternal Grandfather   . Breast cancer Cousin   . Thyroid cancer Mother 79       facial radiation for acne as teen  . Lymphoma Paternal Aunt 40       deceased 66  . Breast cancer Paternal Aunt     ALLERGIES:  has No Known Allergies.  MEDICATIONS:  Current Outpatient Medications  Medication Sig Dispense Refill  . loperamide (IMODIUM) 2 MG capsule Take 1 capsule (2 mg total) by mouth as needed for diarrhea or loose stools. 30 capsule 0  . loratadine (CLARITIN) 10 MG tablet Take 10 mg by mouth daily as needed for allergies.    . magic mouthwash w/lidocaine SOLN Take 10 mLs by mouth 3 (three) times daily as needed for mouth pain. Swish and spit 10 ML by mouth 3 times daily as needed for mouth pain. 240 mL 0  . meclizine (ANTIVERT) 25 MG tablet Take 25 mg by mouth 3 (three) times daily as needed for dizziness.    . potassium chloride (K-DUR) 10 MEQ tablet Take 1 tablet (10 mEq total) by mouth 3 (three) times daily. 90 tablet 1  . prochlorperazine (COMPAZINE) 10 MG tablet Take 1 tablet (10 mg total) by mouth every 6 (six) hours as  needed for nausea or vomiting. 40 tablet 2  . traMADol (ULTRAM) 50 MG tablet Take 1 tablet (50 mg total) by mouth every 6 (six) hours as needed. (Patient taking differently: Take 50 mg by mouth every 6 (six) hours as needed for moderate pain. ) 30 tablet 0  . gabapentin (NEURONTIN) 100 MG capsule Take 1 capsule (100 mg total) by mouth 3 (three) times daily. 90 capsule 0  . ondansetron (ZOFRAN-ODT) 8 MG disintegrating tablet TAKE 1 TABLET BY MOUTH EVERY 8 HOURS AS NEEDED FOR NAUSE OR VOMITING 40 tablet 0   No current facility-administered medications for this visit.    Facility-Administered Medications Ordered in Other Visits  Medication Dose Route Frequency Provider Last Rate Last Dose  . fluorouracil (ADRUCIL) 2,650 mg in sodium chloride 0.9 % 97 mL chemo infusion  1,800 mg/m2 (Treatment Plan Recorded) Intravenous 1 day or 1 dose [name], MD      . heparin lock flush 100 unit/mL  500 Units Intracatheter Once PRN [name], MD      . irinotecan LIPOSOME (ONIVYDE) 60 mg in sodium chloride 0.9 % 500 mL chemo infusion  60 mg Intravenous Once [name], MD      . leucovorin 592 mg in dextrose 5 % 250 mL infusion  400 mg/m2 (Treatment Plan Recorded) Intravenous Once [name], MD      . potassium chloride SA (K-DUR,KLOR-CON) CR tablet 20 mEq  20 mEq Oral Once [name], MD      . sodium chloride flush (NS) 0.9 % injection 10 mL  10 mL Intracatheter PRN [name], MD        REVIEW OF SYSTEMS:  Constitutional: Denies fevers, chills or abnormal night sweats (+) weight gain (+) better appetite (+0 hair loss Eyes: Denies blurriness of vision, double vision or watery eyes Ears, nose, mouth, throat, and face: Denies mucositis or sore throat Respiratory: Denies cough, dyspnea or wheezes  Cardiovascular: Denies palpitation, chest discomfort or lower extremity swelling Gastrointestinal:  No constipation (+) diarrhea, improved tremendously (+) occasional nausea  Skin: Denies abnormal skin rashes, tender  knots down lower legs bilaterally Lymphatics: Denies new lymphadenopathy or easy bruising Neurological:Denies new weaknesses (+) numbness and tingling in all extremities. Behavioral/Psych: Mood is stable, no new changes  All other systems were reviewed with the patient and are negative.  PHYSICAL EXAMINATION:   ECOG PERFORMANCE STATUS: 1 Today's Vitals   03/28/18 0913  BP: 119/76  Pulse: 77  Resp: 18  Temp: 98.3 F (36.8 C)  TempSrc: Oral  SpO2: 100%  Weight: 108 lb 4.8 oz (49.1 kg)  Height: _0  (1.651 m)   GENERAL:alert, no distress and comfortable (+) hair loss SKIN: skin color, texture, turgor are normal, no rashes or significant lesions EYES: normal, conjunctiva are pink and non-injected, sclera clear OROPHARYNX:no exudate, no erythema and lips, buccal mucosa, and tongue normal  NECK: supple, thyroid normal size, non-tender, without nodularity LYMPH:  no palpable lymphadenopathy in the cervical, axillary or inguinal LUNGS: clear to auscultation and percussion with normal breathing effort HEART: regular rate & rhythm and no murmurs and no lower extremity edema ABDOMEN:abdomen soft, normal bowel sounds. No hepatomegaly or tenderness on palpation (+) small hernia in the left inguial area Musculoskeletal:no cyanosis of digits and no clubbing  PSYCH: alert & oriented x 3 with fluent speech NEURO: no focal motor deficit. (+) decreased vibration on all extremities, worse in LLs  LABORATORY DATA:  I have reviewed the data as listed CBC Latest Ref Rng & Units 03/28/2018 03/15/2018 02/23/2018  WBC 3.9 - 10.3 K/uL 2.6(L) 2.2(L) 14.1(H)  Hemoglobin 11.6 - 15.9 g/dL 9.6(L) 10.3(L) 8.1(L)  Hematocrit 34.8 - 46.6 % 27.5(L) 31.6(L) 25.2(L)  Platelets 145 - 400 K/uL 121(L) 91(L) 115(L)    CMP Latest Ref Rng & Units 03/28/2018 03/15/2018 02/23/2018  Glucose 70 - 99 mg/dL 110(H) 121(H) 104(H)  BUN 6 - 20 mg/dL 22(H) 18 13  Creatinine 0.44 - 1.00 mg/dL 1.19(H) 1.13(H) 1.26(H)  Sodium 135 - 145  mmol/L 141 143 142  Potassium 3.5 - 5.1 mmol/L 3.2(L) 3.3(L) 3.4(L)  Chloride 98 - 111 mmol/L 106 107 104  CO2 22 - 32 mmol/L _1 Calcium 8.9 - 10.3 mg/dL 9.5 9.7 9.5  Total Protein 6.5 - 8.1 g/dL 7.5 7.4 7.1  Total Bilirubin 0.3 - 1.2 mg/dL 0.9 0.9 0.6  Alkaline Phos 38 - 126 U/L 266(H) 262(H) 398(H)  AST 15 - 41 U/L 74(H) 76(H) 100(H)  ALT 0 - 44 U/L 71(H) 75(H) 110(H)   CBC    Component Value Date/Time   WBC 2.6 (L) 03/28/2018 0834   RBC 2.75 (L) 03/28/2018 0834   HGB 9.6 (L) 03/28/2018 0834   HGB 6.6 (LL) 01/12/2018 1130   HGB 11.9 08/23/2017 0951   HCT 27.5 (L) 03/28/2018 0834   HCT 36.0 08/23/2017 0951   PLT 121 (L) 03/28/2018 0834   PLT 31 (L) 01/12/2018 1130   PLT 401 (H) 08/23/2017 0951   MCV 100.0 03/28/2018 0834   MCV 92.8 08/23/2017 0951   MCH 34.8 (H) 03/28/2018 0834   MCHC 34.8 03/28/2018 0834   RDW 18.7 (H) 03/28/2018 0834   RDW 14.2 08/23/2017 0951   LYMPHSABS 0.5 (L) 03/28/2018 0834   LYMPHSABS 0.8 (L) 08/23/2017 0951   MONOABS 0.4 03/28/2018 0834   MONOABS 1.3 (H) 08/23/2017 0951   EOSABS 0.1 03/28/2018 0834   EOSABS 0.1 08/23/2017 0951   BASOSABS  0.0 03/28/2018 0834   BASOSABS 0.0 08/23/2017 0951     Tumor Markers  Results for Morgan Dawson, Morgan Dawson (MRN [id number]) as of 03/10/2018 14:44  Ref. Range 10/18/2017 09:32 11/15/2017 10:37 12/20/2017 09:33 01/21/2018 09:04 02/23/2018 08:21  CA 19-9 Latest Ref Range: 0 - 35 U/mL 40 (H) 33 28 32 25   Results for Morgan Dawson, Morgan Dawson (MRN [id number]) as of 03/10/2018 14:44  Ref. Range 08/19/2017 14:13  CEA ([facility]) Latest Ref Range: 0.00 - 5.00 ng/mL 1.31    PATHOLOGY 11/22/2017 Molecular Pathology The following genes were evaluated for sequence changes and exonic deletions/duplications: ALK, APC, ATM, AXIN2, BAP1, BARD1, BLM, BMPR1A, BRCA1, BRCA2, BRIP1, CASR, CDC73, CDH1, CDK4, CDKN1B, CDKN1C, CDKN2A (p14ARF), CDKN2A (p16INK4a), CEBPA, CHEK2, CTNNA1, DICER1, DIS3L2, EPCAM*, FH, FLCN, GATA2, GPC3,  GREM1*, HRAS, KIT, MAX, MEN1, MET, MLH1, MSH2, MSH3, MSH6, MUTYH, NBN, NF1, NF2, PALB2, PDGFRA, PHOX2B*, PMS2, POLD1, POLE, POT1, PRKAR1A, PTCH1, PTEN, RAD50, RAD51C, RAD51D, RB1, RECQL4, RET, RUNX1, SDHAF2, SDHB, SDHC, SDHD, SMAD4, SMARCA4, SMARCB1, SMARCE1, STK11, SUFU, TERC, TERT, TMEM127, TP53, TSC1, TSC2, VHL, WRN*, WT1 The following genes were evaluated for sequence changes only: EGFR*, HOXB13*, MITF*, NTHL1*, SDHA Results are negative unless otherwise indicated    Liver Biopsy  Diagnosis 08/05/17 Liver, needle/core biopsy - CARCINOMA. - SEE COMMENT. Microscopic Comment The malignant cells are positive for cytokeratin 7. They are negative for arginase, CDX2, cytokeratin 20, estrogen receptor, GATA-3, GCDFP, Glypican 3, Hep Par 1, Napsin A, and TTF-1. This immunohistochemical is nonspecific. Possibly primary sources include pancreatobiliary and upper gastrointestinal. Radiologic correlation is necessary. Of note, organ specific markers (GATA-3, GCDFP-breast, TTF-1, Napsin A-lung, and CDX2-colon) are negative. (JBK:ecj 08/09/2017)   Diagnosis 03/07/13 Surgical, cecum, polyp -TUBULAR ADENOMA -NEGATIVE FOR HIGH GRADE DYSPLASIA   PROCEDURES  Colonoscopy by Dr. Paulita Fujita 03/07/13 IMPRESSION:  -One 5 m polyp in the cecum. Resected and retrieved -The distal rectum and anal verge are normal on retroflexion view.  -The examination was otherwise normal.    RADIOGRAPHIC STUDIES: I have personally reviewed the radiological images as listed and agreed with the findings in the report.  No results found. 02/21/2018 PET Scan  IMPRESSION: 1. There are 2 new small nodules identified within the right lung which measure up to 5 mm. These are too small to reliably characterize by PET-CT, but warrant close interval follow-up. 2. Again noted are multifocal liver metastasis. The target lesion within the left lobe is slightly decreased in size when compared with the previous exam. Similar to  previous exam there is no abnormal hypermetabolism above background liver activity identified within the liver lesions. 3. Gallstones.  PET Scan 12/17/17 IMPRESSION: 1. Although the liver metastases are still visible on the CT images, they are significantly smaller and retain no abnormal metabolic activity, consistent with response to therapy. 2. No abnormal activity within the pancreas. 3. No disease progression identified. 4. Cholelithiasis.  CT CAP WO Contrast 10/14/17 IMPRESSION: Evidence of known numerous liver metastases without significant interval change. No other evidence of metastatic disease within the chest, abdomen or pelvis. Cholelithiasis. Tiny pericardial effusion.   ASSESSMENT & PLAN:  Morgan Dawson is a 57 y.o. caucasian female with a history of vertigo and menieres disease, presented with epigastric discomfort, low appetite and 5 pound weight loss   1. 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  07/29/17 PET scan shows diffuse metastatic disease in her  enlarged liver.  This is most consistent with diffuse liver metastasis.  -Her liver biopsy confirmed metastatic adenocarcinoma, most consistent with biliary or pancreatic primary this was discussed with patient.  -After further reviewing her 07/29/17 PET in the GI Tumor Board, the initial uptake in the duodenum is felt to be in the pancrease tail, which represents the primary tumor.  The consensus from GI tumor board is this is most consistent with metastatic pancreatic adenocarcinoma to liver.  We felt she does not need further imaging or EUS to confirm the primary site.  -Given her metastatic stage IV disease, her metastatic cancer is unfortunately not curable, and the goal of therapy is palliative, to prolong her life and improve her quality of life.  -Goal of therapy is to control  the disease.  -she started first line FOLFIRINOX on 08/21/17, -She has been tolerating chemotherapy moderately well, with some nausea, cold sensitivity, diarrhea, and fatigue, she has developed slightly worsening renal function, has received IV fluids intermittently  -CT CAP WO Contrast from 10/14/17 reveals stable disease, liver metastases without significant interval change. No other evidence of metastatic disease within the chest, abdomen or pelvis..  -Her CA 19.9 continues to decrease, corresponding to her good response to chemotherapy. -her PET scan from 12/17/17 revealed that her liver mets are significantly smaller and retain no abnormal metabolic activity. No new lesions identified. I discussed these results with the pt and her husband. They are very pleased.  -She has developed persistent peripheral neuropathy, grade 2, I stopped her oxaliplatin on 02/10/2018 -I reviewed her restaging PET scan from February 21, 2018, which showed a few new small lung nodules up to 5 mm, not impressive.  No hypermetabolic lesions in the liver or anywhere else. She has had excellent response to chemo so far -her treatment was switched to 5-FU pump infusion and liposomal irinotecan, every 2 weeks.  Due to her cytopenia, I reduced the dose, she tolerated first cycle very well. -She previously had mild neutropenia with ANC 1.2, mild thrombocytopenia with platelet 91,000, no clinical bleeding.  I proceeded second cycle 5-FU and liposomal irinotecan, with further dose reduction of 5-FU due to her thrombocytopenia.  I  added on Neulasta on day 3. -We again discussed with the goal of therapy is palliative, for disease control and to prolong her life.  She feels very well, understands we will continue chemotherapy as long as it is working and she is tolerating. -Lab reviewed, adequate for treatment, will proceed with cycle  3 5-FU and  liposomal irinotecan with dose reduction due to worsening neuropathy. - Refer to PT for her  peripheral neuropathy -F/u in 2 weeks   2. Pancreatitis -Found by 07/23/17 CT scan, likely related to her cancer.  -On bland diet -I previously suggested Prilosec and to continue small meals with bland diet. If needed I can give pain medication. -Abdominal pain is mild, no need for pain medication at this time.    3. Nausea, and diarrhea  -She has had nausea since her cancer diagnosis, she knows to use Zofran and Compazine alternatively for nausea as needed off chemo -She had severe nausea and vomiting towards the end of the chemo infusion cycle 2 -Emend was added with cycle 3, her n/v are overall improved from previous cycles. She had 2 days of mild n/v after pump d/c, managed with zofran and compazine. She will continue current supportive regimen. Will monitor.  -She has mild diarrhea after chemo, has not needed antidiarrheal medication.  4.  Anorexia, weight loss -She has anxiety about eating due to emesis.  -I previously encouraged her to eat more small meals with bland or liquid diet. She can increase her intake of ensure boost -Follow-up with dietitian -weight stable lately   5. Hypercalcemia -Secondary to her underlying malignancy -Received Zometa and IV hydration in December 2018, hypercalcemia has resolved. -She restarted dairy products. -She had cramps, her Ca was good to restart calcium and magnesium.  6.  Transaminitis -Due to her underlying liver metastasis and chemo  -Improved since she started chemo, slightly worse again lately  -Monitoring  7. 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 Hypokalemia, CKD stage III -She takes 10-40 mEq on a given day depending on her ability to swallow  pills. K was 3.0 10/04/17. She does not have heavy GI output. I recommended she take 20 mEq daily consistently.  -Creatinine continues to increase; she appears adequately hydrated. Could be related to chemotherapy. Will check mag to make sure it is normal. Will obtain uric acid to r/o tumor lysis.  -will check labs closely, add on with pump d/c on day 3 -her Cr is 1.97 on (10/18/17), so she received IVF, her K was 3.2, She can increase to 3 potassium supplements for the next 3 days.  -Cr remains presistently high, I stopped her spironolactone on 11/01/17 - US renal from 11/08/17 revealed hyperechoic renal cortex bilaterally without hydronephrosis -continue KCL supplement, she takes 30 mEq daily  9. Meniere's disease -She had recent meniere's flare with dizziness since last cycle. She is not currently on daily treatment; takes meclizine PRN. I previously discussed with ENT, she was prescribed aldactone. She has been holding due to increased creatinine.   10. Genetics -She previously met with genetic counselor and test was not recommended  -I informed pt today that testing will be beneficial to know if she will be a candidate for other drugs in the future if she is tested positive for Lynch Syndrome or BRCA1/BRCA2 mutation carrier  -Her genetic testing (83 cancer related genes) was negative  11.  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 - Her neuropathy is getting worse and is bothering her. I will prescribe Gabapentin for her and monitor.  She will start at 100 mg at bedtime, and gradually increase dose to 300, and she will use during the day if needed and if she tolerates.   12. Goal of care discussion  -We previously discussed the incurable nature of her cancer, and the overall poor prognosis, especially if she  does not have good response to chemotherapy or progress on chemo -The patient understands the goal of care is palliative. -I previously recommended DNR/DNI, she will think about it    PLAN: -Lab reviewed, adequate for treatment, will proceed with cycle  3 5-FU and  liposomal irinotecan with dose reduction due to worsening neuropathy. - Refer to PT for neuropathy -I prescribed Neurontin 100 mg tid, she will start at night, and titrate up the dose as tolerates -Lab, flush, follow-up in the treatment in 2 weeks  Orders Placed This Encounter  Procedures  . Ambulatory referral to Physical Therapy    Referral Priority:   Routine    Referral Type:   Physical Medicine    Referral Reason:   Specialty Services Required    Requested Specialty:   Physical Therapy    Number of Visits Requested:   1    All questions were answered. The patient knows to call the clinic with any problems, questions or concerns. I spent 25 minutes counseling the patient face to face. The total time spent in the appointment was 30 minutes and more than 50% was on counseling.  I have reviewed the above documentation for accuracy and completeness, and I agree with the above.  Dierdre Searles Dweik am acting as scribe for Dr. [name].  I have reviewed the above documentation for accuracy and completeness, and I agree with the above.    [name], MD 03/28/2018

## 2018-03-25 ENCOUNTER — Other Ambulatory Visit: Payer: Self-pay | Admitting: Nurse Practitioner

## 2018-03-28 ENCOUNTER — Inpatient Hospital Stay: Payer: 59

## 2018-03-28 ENCOUNTER — Inpatient Hospital Stay: Payer: 59 | Attending: Hematology

## 2018-03-28 ENCOUNTER — Encounter: Payer: Self-pay | Admitting: Hematology

## 2018-03-28 ENCOUNTER — Inpatient Hospital Stay (HOSPITAL_BASED_OUTPATIENT_CLINIC_OR_DEPARTMENT_OTHER): Payer: 59 | Admitting: Hematology

## 2018-03-28 VITALS — BP 119/76 | HR 77 | Temp 98.3°F | Resp 18 | Ht 65.0 in | Wt 108.3 lb

## 2018-03-28 DIAGNOSIS — Z5111 Encounter for antineoplastic chemotherapy: Secondary | ICD-10-CM | POA: Diagnosis not present

## 2018-03-28 DIAGNOSIS — K859 Acute pancreatitis without necrosis or infection, unspecified: Secondary | ICD-10-CM | POA: Insufficient documentation

## 2018-03-28 DIAGNOSIS — G620 Drug-induced polyneuropathy: Secondary | ICD-10-CM | POA: Insufficient documentation

## 2018-03-28 DIAGNOSIS — F5089 Other specified eating disorder: Secondary | ICD-10-CM | POA: Insufficient documentation

## 2018-03-28 DIAGNOSIS — H8109 Meniere's disease, unspecified ear: Secondary | ICD-10-CM

## 2018-03-28 DIAGNOSIS — C251 Malignant neoplasm of body of pancreas: Secondary | ICD-10-CM

## 2018-03-28 DIAGNOSIS — Z807 Family history of other malignant neoplasms of lymphoid, hematopoietic and related tissues: Secondary | ICD-10-CM

## 2018-03-28 DIAGNOSIS — E876 Hypokalemia: Secondary | ICD-10-CM

## 2018-03-28 DIAGNOSIS — T451X5A Adverse effect of antineoplastic and immunosuppressive drugs, initial encounter: Secondary | ICD-10-CM

## 2018-03-28 DIAGNOSIS — N183 Chronic kidney disease, stage 3 (moderate): Secondary | ICD-10-CM

## 2018-03-28 DIAGNOSIS — R110 Nausea: Secondary | ICD-10-CM

## 2018-03-28 DIAGNOSIS — C787 Secondary malignant neoplasm of liver and intrahepatic bile duct: Secondary | ICD-10-CM | POA: Insufficient documentation

## 2018-03-28 DIAGNOSIS — Z800 Family history of malignant neoplasm of digestive organs: Secondary | ICD-10-CM

## 2018-03-28 DIAGNOSIS — C259 Malignant neoplasm of pancreas, unspecified: Secondary | ICD-10-CM | POA: Insufficient documentation

## 2018-03-28 DIAGNOSIS — K521 Toxic gastroenteritis and colitis: Secondary | ICD-10-CM | POA: Insufficient documentation

## 2018-03-28 DIAGNOSIS — R740 Nonspecific elevation of levels of transaminase and lactic acid dehydrogenase [LDH]: Secondary | ICD-10-CM | POA: Diagnosis not present

## 2018-03-28 DIAGNOSIS — Z5189 Encounter for other specified aftercare: Secondary | ICD-10-CM | POA: Diagnosis not present

## 2018-03-28 DIAGNOSIS — R918 Other nonspecific abnormal finding of lung field: Secondary | ICD-10-CM | POA: Diagnosis not present

## 2018-03-28 DIAGNOSIS — Z803 Family history of malignant neoplasm of breast: Secondary | ICD-10-CM

## 2018-03-28 DIAGNOSIS — D61810 Antineoplastic chemotherapy induced pancytopenia: Secondary | ICD-10-CM

## 2018-03-28 DIAGNOSIS — R197 Diarrhea, unspecified: Secondary | ICD-10-CM

## 2018-03-28 DIAGNOSIS — Z7189 Other specified counseling: Secondary | ICD-10-CM

## 2018-03-28 LAB — CBC WITH DIFFERENTIAL/PLATELET
BASOS ABS: 0 10*3/uL (ref 0.0–0.1)
Basophils Relative: 1 %
EOS ABS: 0.1 10*3/uL (ref 0.0–0.5)
Eosinophils Relative: 3 %
HCT: 27.5 % — ABNORMAL LOW (ref 34.8–46.6)
Hemoglobin: 9.6 g/dL — ABNORMAL LOW (ref 11.6–15.9)
LYMPHS ABS: 0.5 10*3/uL — AB (ref 0.9–3.3)
Lymphocytes Relative: 20 %
MCH: 34.8 pg — AB (ref 25.1–34.0)
MCHC: 34.8 g/dL (ref 31.5–36.0)
MCV: 100 fL (ref 79.5–101.0)
Monocytes Absolute: 0.4 10*3/uL (ref 0.1–0.9)
Monocytes Relative: 17 %
NEUTROS PCT: 59 %
Neutro Abs: 1.5 10*3/uL (ref 1.5–6.5)
Platelets: 121 10*3/uL — ABNORMAL LOW (ref 145–400)
RBC: 2.75 MIL/uL — AB (ref 3.70–5.45)
RDW: 18.7 % — ABNORMAL HIGH (ref 11.2–14.5)
WBC: 2.6 10*3/uL — AB (ref 3.9–10.3)

## 2018-03-28 LAB — LIPASE, BLOOD: Lipase: 77 U/L — ABNORMAL HIGH (ref 11–51)

## 2018-03-28 LAB — COMPREHENSIVE METABOLIC PANEL
ALT: 71 U/L — ABNORMAL HIGH (ref 0–44)
AST: 74 U/L — ABNORMAL HIGH (ref 15–41)
Albumin: 3.4 g/dL — ABNORMAL LOW (ref 3.5–5.0)
Alkaline Phosphatase: 266 U/L — ABNORMAL HIGH (ref 38–126)
Anion gap: 12 (ref 5–15)
BUN: 22 mg/dL — ABNORMAL HIGH (ref 6–20)
CHLORIDE: 106 mmol/L (ref 98–111)
CO2: 23 mmol/L (ref 22–32)
CREATININE: 1.19 mg/dL — AB (ref 0.44–1.00)
Calcium: 9.5 mg/dL (ref 8.9–10.3)
GFR, EST AFRICAN AMERICAN: 58 mL/min — AB (ref >60)
GFR, EST NON AFRICAN AMERICAN: 50 mL/min — AB (ref >60)
Glucose, Bld: 110 mg/dL — ABNORMAL HIGH (ref 70–99)
POTASSIUM: 3.2 mmol/L — AB (ref 3.5–5.1)
Sodium: 141 mmol/L (ref 135–145)
Total Bilirubin: 0.9 mg/dL (ref 0.3–1.2)
Total Protein: 7.5 g/dL (ref 6.5–8.1)

## 2018-03-28 MED ORDER — SODIUM CHLORIDE 0.9 % IV SOLN
1800.0000 mg/m2 | INTRAVENOUS | Status: DC
  Administered 2018-03-28: 2650 mg via INTRAVENOUS
  Filled 2018-03-28: qty 53

## 2018-03-28 MED ORDER — PROCHLORPERAZINE MALEATE 10 MG PO TABS
10.0000 mg | ORAL_TABLET | Freq: Once | ORAL | Status: AC
  Administered 2018-03-28: 10 mg via ORAL

## 2018-03-28 MED ORDER — GABAPENTIN 100 MG PO CAPS: 100.0000 mg | ORAL_CAPSULE | Freq: Three times a day (TID) | ORAL | 0 refills | Status: DC

## 2018-03-28 MED ORDER — SODIUM CHLORIDE 0.9 % IV SOLN
Freq: Once | INTRAVENOUS | Status: AC
  Administered 2018-03-28: 11:00:00 via INTRAVENOUS
  Filled 2018-03-28: qty 250

## 2018-03-28 MED ORDER — POTASSIUM CHLORIDE CRYS ER 20 MEQ PO TBCR: 20.0000 meq | EXTENDED_RELEASE_TABLET | Freq: Once | ORAL | Status: DC

## 2018-03-28 MED ORDER — POTASSIUM CHLORIDE CRYS ER 20 MEQ PO TBCR
EXTENDED_RELEASE_TABLET | ORAL | Status: AC
  Filled 2018-03-28: qty 1

## 2018-03-28 MED ORDER — PROCHLORPERAZINE MALEATE 10 MG PO TABS
ORAL_TABLET | ORAL | Status: AC
  Filled 2018-03-28: qty 1

## 2018-03-28 MED ORDER — POTASSIUM CHLORIDE CRYS ER 20 MEQ PO TBCR: 10.0000 meq | EXTENDED_RELEASE_TABLET | Freq: Two times a day (BID) | ORAL | Status: DC

## 2018-03-28 MED ORDER — HEPARIN SOD (PORK) LOCK FLUSH 100 UNIT/ML IV SOLN
500.0000 [IU] | Freq: Once | INTRAVENOUS | Status: DC | PRN
  Filled 2018-03-28: qty 5

## 2018-03-28 MED ORDER — LEUCOVORIN CALCIUM INJECTION 350 MG
400.0000 mg/m2 | Freq: Once | INTRAVENOUS | Status: AC
  Administered 2018-03-28: 592 mg via INTRAVENOUS
  Filled 2018-03-28: qty 29.6

## 2018-03-28 MED ORDER — POTASSIUM CHLORIDE ER 10 MEQ PO TBCR
20.0000 meq | EXTENDED_RELEASE_TABLET | Freq: Once | ORAL | Status: AC
  Administered 2018-03-28: 20 meq via ORAL
  Filled 2018-03-28: qty 2

## 2018-03-28 MED ORDER — SODIUM CHLORIDE 0.9% FLUSH
10.0000 mL | INTRAVENOUS | Status: DC | PRN
  Filled 2018-03-28: qty 10

## 2018-03-28 MED ORDER — SODIUM CHLORIDE 0.9 % IV SOLN
60.0000 mg | Freq: Once | INTRAVENOUS | Status: AC
  Administered 2018-03-28: 60 mg via INTRAVENOUS
  Filled 2018-03-28: qty 13.95

## 2018-03-28 NOTE — Patient Instructions (Signed)
[facility] Discharge Instructions for Patients Receiving Chemotherapy  Today you received the following chemotherapy agents Onivyde,,leucovorin, Adrucil To help prevent nausea and vomiting after your treatment, we encourage you to take your nausea medication as directed   If you develop nausea and vomiting that is not controlled by your nausea medication, call the clinic.   BELOW ARE SYMPTOMS THAT SHOULD BE REPORTED IMMEDIATELY:  *FEVER GREATER THAN 100.5 F  *CHILLS WITH OR WITHOUT FEVER  NAUSEA AND VOMITING THAT IS NOT CONTROLLED WITH YOUR NAUSEA MEDICATION  *UNUSUAL SHORTNESS OF BREATH  *UNUSUAL BRUISING OR BLEEDING  TENDERNESS IN MOUTH AND THROAT WITH OR WITHOUT PRESENCE OF ULCERS  *URINARY PROBLEMS  *BOWEL PROBLEMS  UNUSUAL RASH Items with * indicate a potential emergency and should be followed up as soon as possible.  Feel free to call the clinic should you have any questions or concerns. The clinic phone number is [phone].  Please show the [place] at check-in to the Emergency Department and triage nurse.

## 2018-03-29 ENCOUNTER — Telehealth: Payer: Self-pay | Admitting: Hematology

## 2018-03-29 LAB — CANCER ANTIGEN 19-9: CA 19-9: 17 U/mL (ref 0–35)

## 2018-03-29 NOTE — Telephone Encounter (Signed)
Spoke with patient and advised that I had sent her referral through [facility]  and [facility] will contact patient to scheduled per 8/5 los

## 2018-03-30 ENCOUNTER — Inpatient Hospital Stay: Payer: 59

## 2018-03-30 VITALS — BP 117/78 | HR 69 | Temp 98.4°F | Resp 18

## 2018-03-30 DIAGNOSIS — Z7189 Other specified counseling: Secondary | ICD-10-CM

## 2018-03-30 DIAGNOSIS — C787 Secondary malignant neoplasm of liver and intrahepatic bile duct: Principal | ICD-10-CM

## 2018-03-30 DIAGNOSIS — C251 Malignant neoplasm of body of pancreas: Secondary | ICD-10-CM

## 2018-03-30 DIAGNOSIS — Z5111 Encounter for antineoplastic chemotherapy: Secondary | ICD-10-CM | POA: Diagnosis not present

## 2018-03-30 DIAGNOSIS — C259 Malignant neoplasm of pancreas, unspecified: Secondary | ICD-10-CM

## 2018-03-30 MED ORDER — HEPARIN SOD (PORK) LOCK FLUSH 100 UNIT/ML IV SOLN
500.0000 [IU] | Freq: Once | INTRAVENOUS | Status: AC | PRN
  Administered 2018-03-30: 500 [IU]
  Filled 2018-03-30: qty 5

## 2018-03-30 MED ORDER — PEGFILGRASTIM INJECTION 6 MG/0.6ML ~~LOC~~
PREFILLED_SYRINGE | SUBCUTANEOUS | Status: AC
  Filled 2018-03-30: qty 0.6

## 2018-03-30 MED ORDER — SODIUM CHLORIDE 0.9% FLUSH
10.0000 mL | INTRAVENOUS | Status: DC | PRN
  Administered 2018-03-30: 10 mL
  Filled 2018-03-30: qty 10

## 2018-03-30 MED ORDER — PEGFILGRASTIM INJECTION 6 MG/0.6ML ~~LOC~~
6.0000 mg | PREFILLED_SYRINGE | Freq: Once | SUBCUTANEOUS | Status: AC
  Administered 2018-03-30: 6 mg via SUBCUTANEOUS

## 2018-03-30 NOTE — Patient Instructions (Signed)
Pegfilgrastim injection What is this medicine? PEGFILGRASTIM (PEG fil gra stim) is a long-acting granulocyte colony-stimulating factor that stimulates the growth of neutrophils, a type of white blood cell important in the body's fight against infection. It is used to reduce the incidence of fever and infection in patients with certain types of cancer who are receiving chemotherapy that affects the bone marrow, and to increase survival after being exposed to high doses of radiation. This medicine may be used for other purposes; ask your health care provider or pharmacist if you have questions. COMMON BRAND NAME(S): Neulasta What should I tell my health care provider before I take this medicine? They need to know if you have any of these conditions: -kidney disease -latex allergy -ongoing radiation therapy -sickle cell disease -skin reactions to acrylic adhesives (On-Body Injector only) -an unusual or allergic reaction to pegfilgrastim, filgrastim, other medicines, foods, dyes, or preservatives -pregnant or trying to get pregnant -breast-feeding How should I use this medicine? This medicine is for injection under the skin. If you get this medicine at home, you will be taught how to prepare and give the pre-filled syringe or how to use the On-body Injector. Refer to the patient Instructions for Use for detailed instructions. Use exactly as directed. Tell your healthcare provider immediately if you suspect that the On-body Injector may not have performed as intended or if you suspect the use of the On-body Injector resulted in a missed or partial dose. It is important that you put your used needles and syringes in a special sharps container. Do not put them in a trash can. If you do not have a sharps container, call your pharmacist or healthcare provider to get one. Talk to your pediatrician regarding the use of this medicine in children. While this drug may be prescribed for selected conditions,  precautions do apply. Overdosage: If you think you have taken too much of this medicine contact a poison control center or emergency room at once. NOTE: This medicine is only for you. Do not share this medicine with others. What if I miss a dose? It is important not to miss your dose. Call your doctor or health care professional if you miss your dose. If you miss a dose due to an On-body Injector failure or leakage, a new dose should be administered as soon as possible using a single prefilled syringe for manual use. What may interact with this medicine? Interactions have not been studied. Give your health care provider a list of all the medicines, herbs, non-prescription drugs, or dietary supplements you use. Also tell them if you smoke, drink alcohol, or use illegal drugs. Some items may interact with your medicine. This list may not describe all possible interactions. Give your health care provider a list of all the medicines, herbs, non-prescription drugs, or dietary supplements you use. Also tell them if you smoke, drink alcohol, or use illegal drugs. Some items may interact with your medicine. What should I watch for while using this medicine? You may need blood work done while you are taking this medicine. If you are going to need a MRI, CT scan, or other procedure, tell your doctor that you are using this medicine (On-Body Injector only). What side effects may I notice from receiving this medicine? Side effects that you should report to your doctor or health care professional as soon as possible: -allergic reactions like skin rash, itching or hives, swelling of the face, lips, or tongue -dizziness -fever -pain, redness, or irritation at site   where injected -pinpoint red spots on the skin -red or dark-brown urine -shortness of breath or breathing problems -stomach or side pain, or pain at the shoulder -swelling -tiredness -trouble passing urine or change in the amount of urine Side  effects that usually do not require medical attention (report to your doctor or health care professional if they continue or are bothersome): -bone pain -muscle pain This list may not describe all possible side effects. Call your doctor for medical advice about side effects. You may report side effects to FDA at 1-800-FDA-1088. Where should I keep my medicine? Keep out of the reach of children. Store pre-filled syringes in a refrigerator between 2 and 8 degrees C (36 and 46 degrees F). Do not freeze. Keep in carton to protect from light. Throw away this medicine if it is left out of the refrigerator for more than 48 hours. Throw away any unused medicine after the expiration date. NOTE: This sheet is a summary. It may not cover all possible information. If you have questions about this medicine, talk to your doctor, pharmacist, or health care provider.  2018 Elsevier/Gold Standard (2016-08-06 12:58:03)  

## 2018-04-05 NOTE — Progress Notes (Signed)
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 Cholelithiasis.  No secondary signs of acute cholecystitis. 2. Multiple solid liver masses measuring up to 6.5 cm in the left lobe of the liver, evaluation for metastatic disease is recommended. These results will be called to the ordering clinician or representative by the Radiologist Assistant, and communication documented in the PACS or zVision Dashboard.    07/23/2017 Imaging    CT Abdomen W Contrast 07/23/17 IMPRESSION: 1. Widespread metastatic disease throughout the liver. No clear primary malignancy identified in the abdomen. The pelvis was not imaged. Tissue sampling recommended. 2. Probable adenopathy superior to the pancreatic tail. No evidence of pancreatic mass. 3. Suspected incidental hemangioma inferiorly in the right hepatic lobe. 4. Nonspecific nodularity in the breasts. The patient has undergone recent (03/25/2017 and 04/01/2017) mammography and ultrasound.    07/29/2017 Initial Diagnosis    Metastasis to liver of unknown origin ([place])    07/29/2017 PET scan    PET 07/29/17  IMPRESSION: 1. Numerous bulky liver masses are hypermetabolic compatible with malignancy. 2. Accentuated activity within or along the pancreatic tail, likely represent a primary pancreatic tumor. Consider pancreatic protocol MRI to further work  this up. 3. The peripancreatic lymph node shown above the pancreatic tail is mildly hypermetabolic favoring malignancy. 4.  Prominent stool throughout the colon favors constipation. 5. 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    08/05/2017 Pathology Results    Liver Biopsy  Diagnosis 08/05/17 Liver, needle/core biopsy - CARCINOMA. - SEE COMMENT. Microscopic Comment The malignant cells are positive for cytokeratin 7. They are negative for arginase, CDX2, cytokeratin 20, estrogen receptor, GATA-3, GCDFP, Glypican 3, Hep Par 1, Napsin A, and TTF-1. This immunohistochemical is nonspecific. Possibly primary sources include pancreatobiliary and upper gastrointestinal. Radiologic correlation is necessary. Of note, organ specific markers (GATA-3, GCDFP-breast, TTF-1, Napsin A-lung, and CDX2-colon) are negative. (JBK:ecj 08/09/2017)    08/21/2017 -  Chemotherapy    FOLFIRINOX every 2 weeks starting 08/21/17      10/14/2017 Imaging    CT CAP WO Contrast 10/14/17 IMPRESSION: Evidence of known numerous liver metastases without significant interval change. No other evidence of metastatic disease within the chest, abdomen or pelvis. Cholelithiasis. Tiny pericardial effusion.    12/02/2017 Genetic Testing    Negative for pathogenic mutation.  The genes analyzed were the 83 genes on [facility]'s Multi-Cancer panel (ALK, APC, ATM, AXIN2, BAP1, BARD1, BLM, BMPR1A, BRCA1, BRCA2, BRIP1, CASR, CDC73, CDH1, CDK4, CDKN1B, CDKN1C, CDKN2A, CEBPA, CHEK2, CTNNA1, DICER1, DIS3L2, EGFR, EPCAM, FH, FLCN, GATA2, GPC3, GREM1, HOXB13, HRAS, KIT, MAX, MEN1, MET, MITF, MLH1, MSH2, MSH3, MSH6, MUTYH, NBN, NF1, NF2, NTHL1, PALB2, PDGFRA, PHOX2B, PMS2, POLD1, POLE, POT1, PRKAR1A, PTCH1, PTEN, RAD50, RAD51C, RAD51D, RB1, RECQL4, RET, RUNX1, SDHA, SDHAF2, SDHB, SDHC, SDHD, SMAD4, SMARCA4, SMARCB1, SMARCE1, STK11, SUFU, TERC, TERT, TMEM127, TP53, TSC1, TSC2, VHL, WRN, WT1).    12/17/2017 PET scan    IMPRESSION:  1. Although the  liver metastases are still visible on the CT images, they are significantly smaller and retain no abnormal metabolic activity, consistent with response to therapy. 2. No abnormal activity within the pancreas. 3. No disease progression identified. 4. Cholelithiasis.    02/21/2018 PET scan    02/21/2018 PET Scan  IMPRESSION: 1. There are 2 new small nodules identified within the right lung which measure up to 5 mm. These are too small to reliably characterize by PET-CT, but warrant close interval follow-up. 2. Again noted are multifocal liver metastasis. The target lesion within the left lobe is slightly decreased in size when compared with the previous exam. Similar to previous exam there is no abnormal hypermetabolism above background liver activity identified within the liver lesions. 3. Gallstones.    02/22/2018 -  Chemotherapy    5-FU pump infusion and liposomal irinotecan, every 2 weeks.  First cycle dose reduced due to cytopenia in the travel.     Pancreatic cancer metastasized to liver ([place])   08/11/2017 Initial Diagnosis    Pancreatic cancer metastasized to liver [facility])      HISTORY OF PRESENTING ILLNESS: 07/30/17  Morgan Dawson 57 y.o. 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 The patient was referred by her PCP Dr. [name]. The patient presents to the clinic today accompanied by husband.  Today the patient reports the weekend before Thanksgiving she had abdominal issues with bloating and cramps. These symptoms worsened after she would eat. This persisted past Thanksgiving so she went to her PCP.  She presented to her PCP on 07/20/17 for upper abdominal pain, nausea and bloating. She had lab work up and RUQ [place] on 07/23/17. Results showed elevated LFTs (alk phos 227, AST 191, ALT 275) and US showed multiple masses in her liver and pancreatitis. She was put on a bland die due to her pancreatitis. CT AP in 07/23/17 showed diffuse  metastatic disease in her liver with no clear primary. She had a PET scan that shows possible metastasis to peripancreatic lymph node along with her known liver masses.   Today she notes she has focused soreness in RUQ. She feels nauseous and has vomited 3 times total in the past 3 weeks. She has lost weight, initially purposefully. She has lost 5-10 pounds. She has been constipated  lately. She takes dulcolax to help. She denies dark stool or GI bleeding. No hematemesis. She does feel more fatigued. She was relatively active 4-5 days a week before last month. She has not needed medication as she feels she has more discomfort (1-2/10). She has not been exercising because she is afraid of making things worse. Her appetite is low from her discomfort and nausea. She notes multiple tender knots on her lower legs that appeared 5 nights ago. There is no itching. She notes she will follow up with Dr. Paulita Fujita this month for her pancreatitis.   Her last mammogram at the [facility] was 04/01/17 and mostly benign. She was recommended to repeat in 6 months. She notes she frequently has cyst in her breast which were benign before.  Socially she is not working. She usually is active in the gym 3-4 days a week. She has 2 children (son and daughter) at 59yo and 61yo.   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 Her last episode of vertigo was a few months ago. She takes a diuretic and a low salt diet. She has jaw surgery  for TMJ issues. She had endometrial ablation due to menorrhagia in 2011. She has only spotted since, no full period. Patient had a colonoscopy in 2014 with one benign polyp removed. Her father had colon cancer in his 80s. Her maternal grandfather had colon cancer in his 52s. Paternal aunt had lymphoma.   CURRENT THERAPY: second line 5-FU pump infusion and liposomal irinotecan every 2 weeks, started on 02/22/2018, dose reduction due to pancytopenia and neuropathy   INTERVAL HISTORY DEVLYNN KNOFF is here for a follow up. She is here with her husband. She is feeling well. She reports good appetite despite losing some weight. She is recovering well after last cycle and is feeling great after dose reduction.  She reports gum bleeding when brushing her teeth. No epistaxis or other bleeding.   MEDICAL HISTORY:  Past Medical History:  Diagnosis Date  . Genetic testing 12/02/2017   Multi-Cancer panel (83 genes) @ [facility] - No pathogenic mutations detected  . Meniere disease 2016  . Pancreatitis     SURGICAL HISTORY: Past Surgical History:  Procedure Laterality Date  . CERVICAL ABLATION    . ENDOMETRIAL ABLATION  2011  . IR FLUORO GUIDE PORT INSERTION RIGHT  08/12/2017  . IR US GUIDE VASC ACCESS RIGHT  08/12/2017  . MANDIBLE SURGERY  1999    SOCIAL HISTORY: Social History   Socioeconomic History  . Marital status: Married    Spouse name: Not on file  . Number of children: Not on file  . Years of education: Not on file  . Highest education level: Not on file  Occupational History  . Not on file  Social Needs  . Financial resource strain: Not hard at all  . Food insecurity:    Worry: Never true    Inability: Never true  . Transportation needs:    Medical: No    Non-medical: No  Tobacco Use  . Smoking status: Never Smoker  . Smokeless tobacco: Never Used  Substance and Sexual Activity  . Alcohol use: No    Frequency: Never  . Drug use: No  . Sexual activity: Not Currently  Lifestyle  . Physical activity:    Days per week: 4 days    Minutes per session: 30 min  . Stress: Not at all  Relationships  . Social connections:    Talks on phone: Patient refused    Gets together: Patient refused    Attends religious service: Patient refused    Active member of club or organization: Patient refused    Attends meetings of clubs or organizations: Patient refused    Relationship status: Patient refused  . Intimate partner violence:    Fear of current or ex  partner: Patient refused    Emotionally abused: Patient refused    Physically abused: Patient refused    Forced sexual activity: Patient refused  Other Topics Concern  . Not on file  Social History Narrative  . Not on file    FAMILY HISTORY: Family History  Problem Relation Age of Onset  . Colon cancer Father 96       currently 41  . Colon cancer Maternal Grandfather   . Breast cancer Cousin   . Thyroid cancer Mother 52       facial radiation for acne as teen  . Lymphoma Paternal Aunt 20       deceased 15  . Breast cancer Paternal Aunt     ALLERGIES:  has No Known Allergies.  MEDICATIONS:  Current Outpatient  Medications  Medication Sig Dispense Refill  . gabapentin (NEURONTIN) 100 MG capsule Take 1 capsule (100 mg total) by mouth 3 (three) times daily. 90 capsule 0  . loperamide (IMODIUM) 2 MG capsule Take 1 capsule (2 mg total) by mouth as needed for diarrhea or loose stools. 30 capsule 0  . loratadine (CLARITIN) 10 MG tablet Take 10 mg by mouth daily as needed for allergies.    Marland Kitchen meclizine (ANTIVERT) 25 MG tablet Take 25 mg by mouth 3 (three) times daily as needed for dizziness.    . ondansetron (ZOFRAN-ODT) 8 MG disintegrating tablet TAKE 1 TABLET BY MOUTH EVERY 8 HOURS AS NEEDED FOR NAUSE OR VOMITING 40 tablet 0  . potassium chloride (K-DUR) 10 MEQ tablet Take 1 tablet (10 mEq total) by mouth 3 (three) times daily. (Patient taking differently: Take 10 mEq by mouth 4 (four) times daily. ) 90 tablet 1  . prochlorperazine (COMPAZINE) 10 MG tablet Take 1 tablet (10 mg total) by mouth every 6 (six) hours as needed for nausea or vomiting. 40 tablet 2  . magic mouthwash w/lidocaine SOLN Take 10 mLs by mouth 3 (three) times daily as needed for mouth pain. Swish and spit 10 ML by mouth 3 times daily as needed for mouth pain. (Patient not taking: Reported on 04/11/2018) 240 mL 0  . traMADol (ULTRAM) 50 MG tablet Take 1 tablet (50 mg total) by mouth every 6 (six) hours as needed.  (Patient not taking: Reported on 04/11/2018) 30 tablet 0   No current facility-administered medications for this visit.    Facility-Administered Medications Ordered in Other Visits  Medication Dose Route Frequency Provider Last Rate Last Dose  . fluorouracil (ADRUCIL) 2,650 mg in sodium chloride 0.9 % 97 mL chemo infusion  1,800 mg/m2 (Treatment Plan Recorded) Intravenous 1 day or 1 dose [name], MD      . heparin lock flush 100 unit/mL  500 Units Intracatheter Once PRN [name], MD      . irinotecan LIPOSOME (ONIVYDE) 60 mg in sodium chloride 0.9 % 500 mL chemo infusion  60 mg Intravenous Once [name], MD 343 mL/hr at 04/11/18 1145 60 mg at 04/11/18 1145  . leucovorin 592 mg in dextrose 5 % 250 mL infusion  400 mg/m2 (Treatment Plan Recorded) Intravenous Once [name], MD      . sodium chloride flush (NS) 0.9 % injection 10 mL  10 mL Intracatheter PRN [name], MD        REVIEW OF SYSTEMS:  Constitutional: Denies fevers, chills or abnormal night sweats (+) weight loss (+) better appetite (+) hair loss Eyes: Denies blurriness of vision, double vision or watery eyes Ears, nose, mouth, throat, and face: Denies mucositis or sore throat Respiratory: Denies cough, dyspnea or wheezes Cardiovascular: Denies palpitation, chest discomfort or lower extremity swelling Gastrointestinal:  No constipation (+) diarrhea, improved tremendously (+) occasional nausea  Skin: Denies abnormal skin rashes, tender knots down lower legs bilaterally Lymphatics: Denies new lymphadenopathy or easy bruising Neurological:Denies new weaknesses (+) numbness and tingling in all extremities. Behavioral/Psych: Mood is stable, no new changes  All other systems were reviewed with the patient and are negative.  PHYSICAL EXAMINATION:  ECOG PERFORMANCE STATUS: 1 Today's Vitals   04/11/18 1024  BP: 108/67  Pulse: 70  Resp: 17  Temp: 98.7 F (37.1 C)  TempSrc: Oral  SpO2: 99%  Weight: 107 lb 8 oz (48.8 kg)    Height: 5' 5"  (1.651 m)   GENERAL:alert, no distress and  comfortable (+) hair loss SKIN: skin color, texture, turgor are normal, no rashes or significant lesions EYES: normal, conjunctiva are pink and non-injected, sclera clear OROPHARYNX:no exudate, no erythema and lips, buccal mucosa, and tongue normal  NECK: supple, thyroid normal size, non-tender, without nodularity LYMPH:  no palpable lymphadenopathy in the cervical, axillary or inguinal LUNGS: clear to auscultation and percussion with normal breathing effort HEART: regular rate & rhythm and no murmurs and no lower extremity edema ABDOMEN:abdomen soft, normal bowel sounds. No hepatomegaly or tenderness on palpation (+) small hernia in the left inguial area Musculoskeletal:no cyanosis of digits and no clubbing  PSYCH: alert & oriented x 3 with fluent speech NEURO: no focal motor deficit. (+) decreased vibration on all extremities, worse in LLs  LABORATORY DATA:  I have reviewed the data as listed CBC Latest Ref Rng & Units 04/11/2018 03/28/2018 03/15/2018  WBC 3.9 - 10.3 K/uL 7.0 2.6(L) 2.2(L)  Hemoglobin 11.6 - 15.9 g/dL 9.3(L) 9.6(L) 10.3(L)  Hematocrit 34.8 - 46.6 % 28.7(L) 27.5(L) 31.6(L)  Platelets 145 - 400 K/uL 123(L) 121(L) 91(L)    CMP Latest Ref Rng & Units 04/11/2018 03/28/2018 03/15/2018  Glucose 70 - 99 mg/dL 107(H) 110(H) 121(H)  BUN 6 - 20 mg/dL 21(H) 22(H) 18  Creatinine 0.44 - 1.00 mg/dL 1.27(H) 1.19(H) 1.13(H)  Sodium 135 - 145 mmol/L 141 141 143  Potassium 3.5 - 5.1 mmol/L 3.7 3.2(L) 3.3(L)  Chloride 98 - 111 mmol/L 106 106 107  CO2 22 - 32 mmol/L 27 23 27   Calcium 8.9 - 10.3 mg/dL 9.4 9.5 9.7  Total Protein 6.5 - 8.1 g/dL 7.7 7.5 7.4  Total Bilirubin 0.3 - 1.2 mg/dL 0.7 0.9 0.9  Alkaline Phos 38 - 126 U/L 279(H) 266(H) 262(H)  AST 15 - 41 U/L 75(H) 74(H) 76(H)  ALT 0 - 44 U/L 88(H) 71(H) 75(H)    Tumor Markers  Results for LUVADA, SALAMONE (MRN [id number]) as of 04/05/2018 11:11  Ref. Range 11/15/2017 10:37  12/20/2017 09:33 01/21/2018 09:04 02/23/2018 08:21 03/28/2018 08:34  CA 19-9 Latest Ref Range: 0 - 35 U/mL 33 28 32 25 17   Results for JOYA, WILLMOTT (MRN [id number]) as of 04/05/2018 11:11  Ref. Range 08/19/2017 14:13  CEA ([facility]) Latest Ref Range: 0.00 - 5.00 ng/mL 1.31    PATHOLOGY 11/22/2017 Molecular Pathology The following genes were evaluated for sequence changes and exonic deletions/duplications: ALK, APC, ATM, AXIN2, BAP1, BARD1, BLM, BMPR1A, BRCA1, BRCA2, BRIP1, CASR, CDC73, CDH1, CDK4, CDKN1B, CDKN1C, CDKN2A (p14ARF), CDKN2A (p16INK4a), CEBPA, CHEK2, CTNNA1, DICER1, DIS3L2, EPCAM*, FH, FLCN, GATA2, GPC3, GREM1*, HRAS, KIT, MAX, MEN1, MET, MLH1, MSH2, MSH3, MSH6, MUTYH, NBN, NF1, NF2, PALB2, PDGFRA, PHOX2B*, PMS2, POLD1, POLE, POT1, PRKAR1A, PTCH1, PTEN, RAD50, RAD51C, RAD51D, RB1, RECQL4, RET, RUNX1, SDHAF2, SDHB, SDHC, SDHD, SMAD4, SMARCA4, SMARCB1, SMARCE1, STK11, SUFU, TERC, TERT, TMEM127, TP53, TSC1, TSC2, VHL, WRN*, WT1 The following genes were evaluated for sequence changes only: EGFR*, HOXB13*, MITF*, NTHL1*, SDHA Results are negative unless otherwise indicated    Liver Biopsy  Diagnosis 08/05/17 Liver, needle/core biopsy - CARCINOMA. - SEE COMMENT. Microscopic Comment The malignant cells are positive for cytokeratin 7. They are negative for arginase, CDX2, cytokeratin 20, estrogen receptor, GATA-3, GCDFP, Glypican 3, Hep Par 1, Napsin A, and TTF-1. This immunohistochemical is nonspecific. Possibly primary sources include pancreatobiliary and upper gastrointestinal. Radiologic correlation is necessary. Of note, organ specific markers (GATA-3, GCDFP-breast, TTF-1, Napsin A-lung, and CDX2-colon) are negative. (JBK:ecj 08/09/2017)   Diagnosis 03/07/13 Surgical, cecum, polyp -TUBULAR ADENOMA -  NEGATIVE FOR HIGH GRADE DYSPLASIA   PROCEDURES  Colonoscopy by Dr. Paulita Fujita 03/07/13 IMPRESSION:  -One 5 m polyp in the cecum. Resected and retrieved -The distal rectum  and anal verge are normal on retroflexion view.  -The examination was otherwise normal.    RADIOGRAPHIC STUDIES: I have personally reviewed the radiological images as listed and agreed with the findings in the report.  02/21/2018 PET Scan  IMPRESSION: 1. There are 2 new small nodules identified within the right lung which measure up to 5 mm. These are too small to reliably characterize by PET-CT, but warrant close interval follow-up. 2. Again noted are multifocal liver metastasis. The target lesion within the left lobe is slightly decreased in size when compared with the previous exam. Similar to previous exam there is no abnormal hypermetabolism above background liver activity identified within the liver lesions. 3. Gallstones.  PET Scan 12/17/17 IMPRESSION: 1. Although the liver metastases are still visible on the CT images, they are significantly smaller and retain no abnormal metabolic activity, consistent with response to therapy. 2. No abnormal activity within the pancreas. 3. No disease progression identified. 4. Cholelithiasis.  CT CAP WO Contrast 10/14/17 IMPRESSION: Evidence of known numerous liver metastases without significant interval change. No other evidence of metastatic disease within the chest, abdomen or pelvis. Cholelithiasis. Tiny pericardial effusion.   ASSESSMENT & PLAN:  Morgan Dawson is a 57 y.o. caucasian female with a history of vertigo and menieres disease, presented with epigastric discomfort, low appetite and 5 pound weight loss   1. 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  07/29/17 PET scan shows diffuse metastatic disease in her enlarged liver.  This is most consistent with diffuse liver metastasis.  -Her liver biopsy confirmed metastatic adenocarcinoma, most consistent with biliary or pancreatic primary  this was discussed with patient.  -After further reviewing her 07/29/17 PET in the GI Tumor Board, the initial uptake in the duodenum is felt to be in the pancrease tail, which represents the primary tumor.  The consensus from GI tumor board is this is most consistent with metastatic pancreatic adenocarcinoma to liver.  We felt she does not need further imaging or EUS to confirm the primary site.  -Given her metastatic stage IV disease, her metastatic cancer is unfortunately not curable, and the goal of therapy is palliative, to prolong her life and improve her quality of life.  -Goal of therapy is to control the disease.  -she started first line FOLFIRINOX on 08/21/17.  -She has been tolerating chemotherapy moderately well, with some nausea, cold sensitivity, diarrhea, and fatigue, she has developed slightly worsening renal function, has received IV fluids intermittently  -CT CAP WO Contrast from 10/14/17 reveals stable disease, liver metastases without significant interval change. No other evidence of metastatic disease within the chest, abdomen or pelvis..  -Her CA 19.9 continues to decrease, corresponding to her good response to chemotherapy. -her PET scan from 12/17/17 revealed that her liver mets are significantly smaller and retain no abnormal metabolic activity. No new lesions identified. I discussed these results with the pt and her husband. They are very pleased.  -She has developed persistent peripheral neuropathy, grade 2, I stopped her oxaliplatin on 02/10/2018 -I reviewed her restaging PET scan from February 21, 2018, which showed a few new small lung nodules up to 5 mm, not impressive.  No hypermetabolic lesions in the liver  or anywhere else. She has had excellent response to chemo so far -her treatment was switched to 5-FU pump infusion and liposomal irinotecan, every 2 weeks.  Due to her cytopenia, I reduced the dose, she has been tolerating very well. -She previously had mild neutropenia with  ANC 1.2, mild thrombocytopenia with platelet 91,000, no clinical bleeding.  I proceeded second cycle 5-FU and liposomal irinotecan, with further dose reduction of 5-FU due to her thrombocytopenia.  I  added on Neulasta on day 3. -We again discussed with the goal of therapy is palliative, for disease control and to prolong her life.  She feels very well, understands we will continue chemotherapy as long as it is working and she is tolerating. -Today her WBC is 7K, Hg is 9.3 and platelet is 123K, adequate for treatment, will proceed cycle 4 chemo today with same dose reduction as cycle 3. -We discussed the duration of chemotherapy, likely will be continuous.  Okay to take chemo break for vacation or other reason.  Patient is here for agreement with the plan, she has felt much better since we de-escalate her chemo. -F/u with labs and flush on  9/18 and 10/6.   2. Pancreatitis -Found by 07/23/17 CT scan, likely related to her cancer.  -On bland diet -I previously suggested Prilosec and to continue small meals with bland diet. If needed I can give pain medication. -Abdominal pain is mild, no need for pain medication at this time.    3. Anorexia, weight loss -She has anxiety about eating due to emesis.  -I previously encouraged her to eat more small meals with bland or liquid diet. She can increase her intake of ensure boost -Follow-up with dietitian -weight stable lately   4. Hypercalcemia -Secondary to her underlying malignancy -Received Zometa and IV hydration in December 2018, hypercalcemia has resolved. -She restarted dairy products. -She had cramps, her Ca was good to restart calcium and magnesium.  5.  Transaminitis -Due to her underlying liver metastasis and chemo  -Improved since she started chemo, slightly worse again lately  -Monitoring  6. 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 Hypokalemia, CKD stage III -She takes 10-40 mEq on a given day depending on her ability to swallow pills. K was 3.0 10/04/17. She does not have heavy GI output. I recommended she take 20 mEq daily consistently.  -Creatinine continues to increase; she appears adequately hydrated. Could be related to chemotherapy. Will check mag to make sure it is normal. Will obtain uric acid to r/o tumor lysis.  -will check labs closely, add on with pump d/c on day 3 -her Cr is 1.97 on (10/18/17), so she received IVF, her K was 3.2, She can increase to 3 potassium supplements for the next 3 days.  -Cr remains presistently high, I stopped her spironolactone on 11/01/17 - US renal from 11/08/17 revealed hyperechoic renal cortex bilaterally without hydronephrosis -continue KCL supplement, she takes 40 mEq daily -Today her K is 3.7.  8. Genetics -She previously met with genetic counselor and test was not recommended  -I informed pt today that testing will be beneficial to know if she will be a candidate for other drugs in the future if she is tested positive for [employer]  Syndrome or BRCA1/BRCA2 mutation carrier  -Her genetic testing (83 cancer related genes) was negative  9.  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 - Her neuropathy is getting worse and is bothering her. I will prescribe Gabapentin for her and monitor.  She will start at 100 mg at bedtime, and gradually increase dose to 300, and she will use during the day if needed and if she tolerates.   10. Goal of care discussion  -We previously discussed the incurable nature of her cancer, and the  overall poor prognosis, especially if she does not have good response to chemotherapy or progress on chemo -The patient understands the goal of care is palliative. -I previously recommended DNR/DNI, she will think about it    PLAN: -Lab reviewed, adequate for treatment, will proceed with cycle 4 5-FU and  liposomal irinotecan with same dose reduction as last cycle. -Lab, flush, follow-up in the treatment on 9/18 and 10/6. She plan to have vacation at the end of Sep  -PET Scan on 9/24.   All questions were answered. The patient knows to call the clinic with any problems, questions or concerns. I spent 20 minutes counseling the patient face to face. The total time spent in the appointment was 25 minutes and more than 50% was on counseling.  I have reviewed the above documentation for accuracy and completeness, and I agree with the above.  Dierdre Searles Dweik am acting as scribe for Dr. [name].  I have reviewed the above documentation for accuracy and completeness, and I agree with the above.    [name], MD 04/11/2018

## 2018-04-06 ENCOUNTER — Other Ambulatory Visit: Payer: Self-pay

## 2018-04-06 ENCOUNTER — Encounter: Payer: Self-pay | Admitting: Rehabilitation

## 2018-04-06 ENCOUNTER — Ambulatory Visit: Payer: 59 | Attending: Hematology | Admitting: Rehabilitation

## 2018-04-06 DIAGNOSIS — C787 Secondary malignant neoplasm of liver and intrahepatic bile duct: Secondary | ICD-10-CM | POA: Diagnosis not present

## 2018-04-06 DIAGNOSIS — C259 Malignant neoplasm of pancreas, unspecified: Secondary | ICD-10-CM | POA: Insufficient documentation

## 2018-04-06 DIAGNOSIS — R208 Other disturbances of skin sensation: Secondary | ICD-10-CM | POA: Insufficient documentation

## 2018-04-06 NOTE — Patient Instructions (Signed)
Access Code: [email or web address]  URL: [email or web address]  Date: 04/06/2018  Prepared by: Shan Levans   Exercises  Walking Tandem Stance - 1x daily - 7x weekly  Single Leg Stance - 1x daily - 7x weekly  Heel Raise - 1x daily - 7x weekly  Toe Raises with Counter Support - 1x daily - 7x weekly  Standing Balance with Eyes Closed on Foam - 1x daily - 7x weekly

## 2018-04-06 NOTE — Therapy (Signed)
[place], [place], [address] Phone: [phone]   Fax:  [phone]  Physical Therapy Evaluation  Patient Details  Name: Morgan Dawson MRN: [id number] Date of Birth: [birth year] Referring Provider: Dr. Jolayne Panther   Encounter Date: 04/06/2018  PT End of Session - 04/06/18 0846    Visit Number  1    Number of Visits  1    PT Start Time  0803    PT Stop Time  0840    PT Time Calculation (min)  37 min    Activity Tolerance  Patient tolerated treatment well    Behavior During Therapy  [facility] for tasks assessed/performed       Past Medical History:  Diagnosis Date  . Genetic testing 12/02/2017   Multi-Cancer panel (83 genes) @ [facility] - No pathogenic mutations detected  . Meniere disease 2016  . Pancreatitis     Past Surgical History:  Procedure Laterality Date  . CERVICAL ABLATION    . ENDOMETRIAL ABLATION  2011  . IR FLUORO GUIDE PORT INSERTION RIGHT  08/12/2017  . IR US GUIDE VASC ACCESS RIGHT  08/12/2017  . MANDIBLE SURGERY  1999    There were no vitals filed for this visit.   Subjective Assessment - 04/06/18 0809    Subjective  Starting to get trouble with neuropathy.  Hands started first and now the feet.  If I holding something small I can drop it and not now.  The way I walk has changed.  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      Currently in Pain?  No/denies         [facility] PT Assessment - 04/06/18 0001      Assessment   Medical Diagnosis  pancreatic cancer with mets to liver and lungs    Referring Provider  Dr. Jolayne Panther    Onset Date/Surgical Date  07/27/17    Hand Dominance  Right    Prior Therapy  n      Precautions   Precautions  None    Precaution Comments  cancer      Restrictions   Weight Bearing Restrictions  No    Other Position/Activity Restrictions  n      Balance Screen   Has the patient fallen in the past 6 months  No    Has the patient had a decrease in activity level because of a fear of falling?   No    Is the patient reluctant to leave their home because of a fear of falling?   No      Home Film/video [employer] residence    Living Arrangements  Spouse/significant other    Available Help at Discharge  Family    Additional Comments  no limitations      Prior Function   Leisure  walks about 1/2 mile or 44mile every day and doing yoga at [employer]   Overall Cognitive Status  Within Functional Limits for tasks assessed      Coordination   Gross Motor Movements are Fluid and Coordinated  Yes      Functional Tests   Functional tests  Sit to Stand  Sit to Stand   Comments  ok no hands from seat      ROM / Strength   AROM / PROM / Strength  Strength      Strength   Overall Strength  Within functional limits for tasks performed      High Level Balance   High Level Balance Comments  FABS shortform 15/16 only limitation SL stance R of 10"      Functional Gait  Assessment   Gait assessed   No                Objective measurements completed on examination: See above findings.              PT Education - 04/06/18 0846    Education Details  HEP ideas, continuing to walk and do yoga, [place] chi at cancer center    Person(s) Educated  Patient    Methods  Explanation    Comprehension  Verbalized understanding          PT Long Term Goals - 04/06/18 1431      PT LONG TERM GOAL #1   Title  Pt will be educated on home balance exercises to perform to stop progression of balance impairments    Time  1    Period  Days    Status  Achieved             Plan - 04/06/18 0847    Clinical Impression Statement  Morgan Dawson presents today with some stocking and glove distribution of her CIPN but  without signifiant limitations.  She reports some widened base of support per her husband.  She is currently doing yoga and walking 0.5 - 1 mile per day outside.  She scored a 14/15 on the short FABS with no increased fall risk.  She was given a HEP of balance exercises as well as instructed to continue with yoga and walking and to return if needed.      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    Clinical Presentation  Evolving    Clinical Presentation due to:  taking chemo    Clinical Decision Making  Low    Rehab Potential  Excellent    PT Frequency  One time visit    PT Treatment/Interventions  Patient/family education    PT Next Visit Plan  If pt returns; gait and balance reassessment     Recommended Other Services  thai chi    Consulted and Agree with Plan of Care  Patient       Patient will benefit from skilled therapeutic intervention in order to improve the following deficits and impairments:  Impaired sensation  Visit Diagnosis: Pancreatic cancer metastasized to liver [facility])  Other disturbances of skin sensation     Problem List Patient Active Problem List   Diagnosis Date Noted  . Gastroenteritis 01/12/2018  . Hypokalemia 01/12/2018  . Hypomagnesemia 01/12/2018  . Thrombocytopenia ([place]) 01/12/2018  . Febrile neutropenia ([place]) 01/12/2018  . Diarrhea of presumed infectious origin 01/12/2018  . Genetic testing 12/02/2017  . CKD (chronic kidney disease), stage III ([place]) 11/14/2017  . Anemia due to antineoplastic chemotherapy 11/14/2017  . Port-A-Cath in place 09/21/2017  . Pancreatic cancer metastasized to liver ([place]) 08/11/2017  . Goals of care, counseling/discussion 08/11/2017  . Pancreatic cancer ([place]) 07/29/2017    Shan Levans, PT 04/06/2018, 2:32 PM  [place]  [place], [place], [address] Phone: [phone]    Fax:  [phone]  Name: Morgan Dawson MRN: [id number] Date of Birth: Sep 18, 1960

## 2018-04-11 ENCOUNTER — Inpatient Hospital Stay (HOSPITAL_BASED_OUTPATIENT_CLINIC_OR_DEPARTMENT_OTHER): Payer: 59 | Admitting: Hematology

## 2018-04-11 ENCOUNTER — Inpatient Hospital Stay: Payer: 59

## 2018-04-11 ENCOUNTER — Encounter: Payer: Self-pay | Admitting: Hematology

## 2018-04-11 ENCOUNTER — Encounter: Payer: Self-pay | Admitting: General Practice

## 2018-04-11 VITALS — BP 108/67 | HR 70 | Temp 98.7°F | Resp 17 | Ht 65.0 in | Wt 107.5 lb

## 2018-04-11 DIAGNOSIS — R918 Other nonspecific abnormal finding of lung field: Secondary | ICD-10-CM | POA: Diagnosis not present

## 2018-04-11 DIAGNOSIS — Z7189 Other specified counseling: Secondary | ICD-10-CM

## 2018-04-11 DIAGNOSIS — C787 Secondary malignant neoplasm of liver and intrahepatic bile duct: Secondary | ICD-10-CM

## 2018-04-11 DIAGNOSIS — R110 Nausea: Secondary | ICD-10-CM

## 2018-04-11 DIAGNOSIS — Z5111 Encounter for antineoplastic chemotherapy: Secondary | ICD-10-CM | POA: Diagnosis not present

## 2018-04-11 DIAGNOSIS — C259 Malignant neoplasm of pancreas, unspecified: Secondary | ICD-10-CM

## 2018-04-11 DIAGNOSIS — D696 Thrombocytopenia, unspecified: Secondary | ICD-10-CM

## 2018-04-11 DIAGNOSIS — R740 Nonspecific elevation of levels of transaminase and lactic acid dehydrogenase [LDH]: Secondary | ICD-10-CM

## 2018-04-11 DIAGNOSIS — G620 Drug-induced polyneuropathy: Secondary | ICD-10-CM

## 2018-04-11 DIAGNOSIS — F5089 Other specified eating disorder: Secondary | ICD-10-CM

## 2018-04-11 DIAGNOSIS — D61810 Antineoplastic chemotherapy induced pancytopenia: Secondary | ICD-10-CM

## 2018-04-11 DIAGNOSIS — K521 Toxic gastroenteritis and colitis: Secondary | ICD-10-CM

## 2018-04-11 DIAGNOSIS — E876 Hypokalemia: Secondary | ICD-10-CM

## 2018-04-11 DIAGNOSIS — K859 Acute pancreatitis without necrosis or infection, unspecified: Secondary | ICD-10-CM

## 2018-04-11 DIAGNOSIS — C251 Malignant neoplasm of body of pancreas: Secondary | ICD-10-CM

## 2018-04-11 DIAGNOSIS — N183 Chronic kidney disease, stage 3 (moderate): Secondary | ICD-10-CM

## 2018-04-11 LAB — CBC WITH DIFFERENTIAL/PLATELET
BASOS PCT: 1 %
Basophils Absolute: 0 10*3/uL (ref 0.0–0.1)
Eosinophils Absolute: 0.1 10*3/uL (ref 0.0–0.5)
Eosinophils Relative: 1 %
HEMATOCRIT: 28.7 % — AB (ref 34.8–46.6)
HEMOGLOBIN: 9.3 g/dL — AB (ref 11.6–15.9)
LYMPHS PCT: 13 %
Lymphs Abs: 0.9 10*3/uL (ref 0.9–3.3)
MCH: 33.9 pg (ref 25.1–34.0)
MCHC: 32.4 g/dL (ref 31.5–36.0)
MCV: 104.7 fL — ABNORMAL HIGH (ref 79.5–101.0)
MONOS PCT: 9 %
Monocytes Absolute: 0.7 10*3/uL (ref 0.1–0.9)
NEUTROS ABS: 5.3 10*3/uL (ref 1.5–6.5)
NEUTROS PCT: 76 %
Platelets: 123 10*3/uL — ABNORMAL LOW (ref 145–400)
RBC: 2.74 MIL/uL — ABNORMAL LOW (ref 3.70–5.45)
RDW: 17.7 % — ABNORMAL HIGH (ref 11.2–14.5)
WBC: 7 10*3/uL (ref 3.9–10.3)

## 2018-04-11 LAB — COMPREHENSIVE METABOLIC PANEL
ALK PHOS: 279 U/L — AB (ref 38–126)
ALT: 88 U/L — ABNORMAL HIGH (ref 0–44)
ANION GAP: 8 (ref 5–15)
AST: 75 U/L — ABNORMAL HIGH (ref 15–41)
Albumin: 3.6 g/dL (ref 3.5–5.0)
BUN: 21 mg/dL — ABNORMAL HIGH (ref 6–20)
CHLORIDE: 106 mmol/L (ref 98–111)
CO2: 27 mmol/L (ref 22–32)
Calcium: 9.4 mg/dL (ref 8.9–10.3)
Creatinine, Ser: 1.27 mg/dL — ABNORMAL HIGH (ref 0.44–1.00)
GFR calc non Af Amer: 47 mL/min — ABNORMAL LOW (ref >60)
GFR, EST AFRICAN AMERICAN: 54 mL/min — AB (ref >60)
Glucose, Bld: 107 mg/dL — ABNORMAL HIGH (ref 70–99)
POTASSIUM: 3.7 mmol/L (ref 3.5–5.1)
SODIUM: 141 mmol/L (ref 135–145)
Total Bilirubin: 0.7 mg/dL (ref 0.3–1.2)
Total Protein: 7.7 g/dL (ref 6.5–8.1)

## 2018-04-11 MED ORDER — SODIUM CHLORIDE 0.9% FLUSH
10.0000 mL | INTRAVENOUS | Status: DC | PRN
  Filled 2018-04-11: qty 10

## 2018-04-11 MED ORDER — SODIUM CHLORIDE 0.9 % IV SOLN
Freq: Once | INTRAVENOUS | Status: AC
  Administered 2018-04-11: 11:00:00 via INTRAVENOUS
  Filled 2018-04-11: qty 250

## 2018-04-11 MED ORDER — SODIUM CHLORIDE 0.9 % IV SOLN
1800.0000 mg/m2 | INTRAVENOUS | Status: DC
  Administered 2018-04-11: 2650 mg via INTRAVENOUS
  Filled 2018-04-11: qty 53

## 2018-04-11 MED ORDER — HEPARIN SOD (PORK) LOCK FLUSH 100 UNIT/ML IV SOLN
500.0000 [IU] | Freq: Once | INTRAVENOUS | Status: DC | PRN
  Filled 2018-04-11: qty 5

## 2018-04-11 MED ORDER — PROCHLORPERAZINE MALEATE 10 MG PO TABS
10.0000 mg | ORAL_TABLET | Freq: Once | ORAL | Status: AC
  Administered 2018-04-11: 10 mg via ORAL

## 2018-04-11 MED ORDER — PROCHLORPERAZINE MALEATE 10 MG PO TABS
ORAL_TABLET | ORAL | Status: AC
  Filled 2018-04-11: qty 1

## 2018-04-11 MED ORDER — LEUCOVORIN CALCIUM INJECTION 350 MG
400.0000 mg/m2 | Freq: Once | INTRAMUSCULAR | Status: AC
  Administered 2018-04-11: 592 mg via INTRAVENOUS
  Filled 2018-04-11: qty 29.6

## 2018-04-11 MED ORDER — SODIUM CHLORIDE 0.9 % IV SOLN
60.0000 mg | Freq: Once | INTRAVENOUS | Status: AC
  Administered 2018-04-11: 60 mg via INTRAVENOUS
  Filled 2018-04-11: qty 13.95

## 2018-04-11 NOTE — Patient Instructions (Signed)
[facility] Discharge Instructions for Patients Receiving Chemotherapy Today you received the following chemotherapy agents:  Onivyde, Leucovorin, fluorouracil.  To help prevent nausea and vomiting after your treatment, we encourage you to take your nausea medication as directed.   If you develop nausea and vomiting that is not controlled by your nausea medication, call the clinic.   BELOW ARE SYMPTOMS THAT SHOULD BE REPORTED IMMEDIATELY:  *FEVER GREATER THAN 100.5 F  *CHILLS WITH OR WITHOUT FEVER  NAUSEA AND VOMITING THAT IS NOT CONTROLLED WITH YOUR NAUSEA MEDICATION  *UNUSUAL SHORTNESS OF BREATH  *UNUSUAL BRUISING OR BLEEDING  TENDERNESS IN MOUTH AND THROAT WITH OR WITHOUT PRESENCE OF ULCERS  *URINARY PROBLEMS  *BOWEL PROBLEMS  UNUSUAL RASH Items with * indicate a potential emergency and should be followed up as soon as possible.  Feel free to call the clinic should you have any questions or concerns. The clinic phone number is [phone].  Please show the [place] at check-in to the Emergency Department and triage nurse.

## 2018-04-11 NOTE — Progress Notes (Signed)
[facility] Spiritual Care Note  Visited with Morgan Dawson and husband [name] in infusion today, providing pastoral presence, reflective listening, and conversation about meaning/purpose/support.Per pt, she is in good spirits, and their support is very strong: "Seven months into this, people are still bringing [place] meals!"  Affirmed [name]'s spiritual gifts of positivity and gratitude in the midst of challenge.   Family is aware of ongoing chaplain availability, but please also page if immediate needs arise or circumstances change. Thank you.   [place], [place], [facility] Pager [phone] Voicemail [phone]

## 2018-04-11 NOTE — Progress Notes (Signed)
OK to treat with liver enzymes today per MD feng

## 2018-04-13 ENCOUNTER — Telehealth: Payer: Self-pay

## 2018-04-13 ENCOUNTER — Telehealth: Payer: Self-pay | Admitting: Hematology

## 2018-04-13 ENCOUNTER — Inpatient Hospital Stay: Payer: 59

## 2018-04-13 VITALS — BP 105/69 | HR 77 | Temp 98.9°F | Resp 18

## 2018-04-13 DIAGNOSIS — C787 Secondary malignant neoplasm of liver and intrahepatic bile duct: Principal | ICD-10-CM

## 2018-04-13 DIAGNOSIS — Z5111 Encounter for antineoplastic chemotherapy: Secondary | ICD-10-CM | POA: Diagnosis not present

## 2018-04-13 DIAGNOSIS — C259 Malignant neoplasm of pancreas, unspecified: Secondary | ICD-10-CM

## 2018-04-13 DIAGNOSIS — Z7189 Other specified counseling: Secondary | ICD-10-CM

## 2018-04-13 DIAGNOSIS — C251 Malignant neoplasm of body of pancreas: Secondary | ICD-10-CM

## 2018-04-13 MED ORDER — PEGFILGRASTIM INJECTION 6 MG/0.6ML ~~LOC~~
PREFILLED_SYRINGE | SUBCUTANEOUS | Status: AC
  Filled 2018-04-13: qty 0.6

## 2018-04-13 MED ORDER — PEGFILGRASTIM INJECTION 6 MG/0.6ML ~~LOC~~
6.0000 mg | PREFILLED_SYRINGE | Freq: Once | SUBCUTANEOUS | Status: AC
  Administered 2018-04-13: 6 mg via SUBCUTANEOUS

## 2018-04-13 MED ORDER — HEPARIN SOD (PORK) LOCK FLUSH 100 UNIT/ML IV SOLN
500.0000 [IU] | Freq: Once | INTRAVENOUS | Status: AC | PRN
  Administered 2018-04-13: 500 [IU]
  Filled 2018-04-13: qty 5

## 2018-04-13 MED ORDER — SODIUM CHLORIDE 0.9% FLUSH
10.0000 mL | INTRAVENOUS | Status: DC | PRN
  Administered 2018-04-13: 10 mL
  Filled 2018-04-13: qty 10

## 2018-04-13 NOTE — Telephone Encounter (Signed)
Appts scheduled patient notified and will get new at next appt per 8/19 los

## 2018-04-13 NOTE — Telephone Encounter (Signed)
Per 8/21 patient came in to pick up a copy of her schedule.

## 2018-04-23 DIAGNOSIS — C259 Malignant neoplasm of pancreas, unspecified: Secondary | ICD-10-CM | POA: Diagnosis not present

## 2018-04-25 NOTE — Progress Notes (Signed)
[place]  [phone] [phone]  Clinic Follow up Note   Patient Care Team: [name], MD as PCP - General (Family Medicine) [name], MD as Consulting Physician (Otolaryngology) 04/26/2018  SUMMARY OF ONCOLOGIC HISTORY: Oncology History   Cancer Staging Pancreatic cancer [facility]) Staging form: Exocrine Pancreas, AJCC 8th Edition - Clinical stage from 08/06/2017: Stage IV (cTX, cN0, pM1) - Signed by [name], MD on 08/12/2017       Pancreatic cancer ([place])   07/23/2017 Imaging    US abdomen limited RUQ 07/23/17 IMPRESSION: 1. Cholelithiasis.  No secondary signs of acute cholecystitis. 2. Multiple solid liver masses measuring up to 6.5 cm in the left lobe of the liver, evaluation for metastatic disease is recommended. These results will be called to the ordering clinician or representative by the Radiologist Assistant, and communication documented in the PACS or zVision Dashboard.    07/23/2017 Imaging    CT Abdomen W Contrast 07/23/17 IMPRESSION: 1. Widespread metastatic disease throughout the liver. No clear primary malignancy identified in the abdomen. The pelvis was not imaged. Tissue sampling recommended. 2. Probable adenopathy superior to the pancreatic tail. No evidence of pancreatic mass. 3. Suspected incidental hemangioma inferiorly in the right hepatic lobe. 4. Nonspecific nodularity in the breasts. The patient has undergone recent (03/25/2017 and 04/01/2017) mammography and ultrasound.    07/29/2017 Initial Diagnosis    Metastasis to liver of unknown origin ([place])    07/29/2017 PET scan    PET 07/29/17  IMPRESSION: 1. Numerous bulky liver masses are hypermetabolic compatible with malignancy. 2. Accentuated activity within or along the pancreatic tail, likely represent a primary pancreatic tumor. Consider pancreatic protocol MRI to further work this up. 3. The peripancreatic lymph node shown above the pancreatic  tail is mildly hypermetabolic favoring malignancy. 4.  Prominent stool throughout the colon favors constipation. 5. 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    08/05/2017 Pathology Results    Liver Biopsy  Diagnosis 08/05/17 Liver, needle/core biopsy - CARCINOMA. - SEE COMMENT. Microscopic Comment The malignant cells are positive for cytokeratin 7. They are negative for arginase, CDX2, cytokeratin 20, estrogen receptor, GATA-3, GCDFP, Glypican 3, Hep Par 1, Napsin A, and TTF-1. This immunohistochemical is nonspecific. Possibly primary sources include pancreatobiliary and upper gastrointestinal. Radiologic correlation is necessary. Of note, organ specific markers (GATA-3, GCDFP-breast, TTF-1, Napsin A-lung, and CDX2-colon) are negative. (JBK:ecj 08/09/2017)    08/21/2017 -  Chemotherapy    FOLFIRINOX every 2 weeks starting 08/21/17      10/14/2017 Imaging    CT CAP WO Contrast 10/14/17 IMPRESSION: Evidence of known numerous liver metastases without significant interval change. No other evidence of metastatic disease within the chest, abdomen or pelvis. Cholelithiasis. Tiny pericardial effusion.    12/02/2017 Genetic Testing    Negative for pathogenic mutation.  The genes analyzed were the 83 genes on [facility]'s Multi-Cancer panel (ALK, APC, ATM, AXIN2, BAP1, BARD1, BLM, BMPR1A, BRCA1, BRCA2, BRIP1, CASR, CDC73, CDH1, CDK4, CDKN1B, CDKN1C, CDKN2A, CEBPA, CHEK2, CTNNA1, DICER1, DIS3L2, EGFR, EPCAM, FH, FLCN, GATA2, GPC3, GREM1, HOXB13, HRAS, KIT, MAX, MEN1, MET, MITF, MLH1, MSH2, MSH3, MSH6, MUTYH, NBN, NF1, NF2, NTHL1, PALB2, PDGFRA, PHOX2B, PMS2, POLD1, POLE, POT1, PRKAR1A, PTCH1, PTEN, RAD50, RAD51C, RAD51D, RB1, RECQL4, RET, RUNX1, SDHA, SDHAF2, SDHB, SDHC, SDHD, SMAD4, SMARCA4, SMARCB1, SMARCE1, STK11, SUFU, TERC, TERT, TMEM127, TP53, TSC1, TSC2, VHL, WRN, WT1).    12/17/2017 PET scan    IMPRESSION: 1. Although the liver metastases are still visible on the CT images, they are  significantly smaller and retain no abnormal metabolic activity, consistent with response to therapy. 2. No abnormal activity within the pancreas. 3. No disease progression identified. 4. Cholelithiasis.    02/21/2018 PET scan    02/21/2018 PET Scan  IMPRESSION: 1. There are 2 new small nodules identified within the right lung which measure up to 5 mm. These are too small to reliably characterize by PET-CT, but warrant close interval follow-up. 2. Again noted are multifocal liver metastasis. The target lesion within the left lobe is slightly decreased in size when compared with the previous exam. Similar to previous exam there is no abnormal hypermetabolism above background liver activity identified within the liver lesions. 3. Gallstones.    02/22/2018 -  Chemotherapy    5-FU pump infusion and liposomal irinotecan, every 2 weeks.  First cycle dose reduced due to cytopenia in the travel.     Pancreatic cancer metastasized to liver ([place])   08/11/2017 Initial Diagnosis    Pancreatic cancer metastasized to liver [facility])    CURRENT THERAPY: second line 5-FU pump infusion and liposomal irinotecan every 2 weeks, started on 02/22/2018, dose reduction due to pancytopenia and neuropathy   INTERVAL HISTORY: Morgan Dawson returns for follow up as scheduled. She completed cycle 4 liposomal-irinotecan and 5FU on 8/19. She feels "excellent." Denies fatigue or decreased appetite. No mucositis. Regular BM, no n/v. Denies blood in stool. Denies recent fever, chills, cough, chest pain, or dyspnea. Continues to report constant numbness and discomfort in feet more than hands. Has to concentrate on functions such as buttoning clothes, and other tasks. She began gabapentin 100 mg at night, then escalated to 100 mg TID per instructions but developed dizziness and daytime sleepiness. Now she is on 100 mg qHS; it helps her sleep but has not really improved neuropathy.    MEDICAL HISTORY:  Past Medical History:    Diagnosis Date  . Genetic testing 12/02/2017   Multi-Cancer panel (83 genes) @ [facility] - No pathogenic mutations detected  . Meniere disease 2016  . Pancreatitis     SURGICAL HISTORY: Past Surgical History:  Procedure Laterality Date  . CERVICAL ABLATION    . ENDOMETRIAL ABLATION  2011  . IR FLUORO GUIDE PORT INSERTION RIGHT  08/12/2017  . IR US GUIDE VASC ACCESS RIGHT  08/12/2017  . [place]    I have reviewed the social history and family history with the patient and they are unchanged from previous note.  ALLERGIES:  has No Known Allergies.  MEDICATIONS:  Current Outpatient Medications  Medication Sig Dispense Refill  . gabapentin (NEURONTIN) 100 MG capsule Take 1 capsule (100 mg total) by mouth 3 (three) times daily. 90 capsule 0  . loperamide (IMODIUM) 2 MG capsule Take 1 capsule (2 mg total) by mouth as needed for diarrhea or loose stools. 30 capsule 0  . loratadine (CLARITIN) 10 MG tablet Take 10 mg by mouth daily as needed for allergies.    . magic mouthwash w/lidocaine SOLN Take 10 mLs by mouth 3 (three) times daily as needed for mouth pain. Swish and spit 10 ML by mouth 3 times daily as needed for mouth pain. 240 mL 0  . meclizine (ANTIVERT) 25 MG tablet Take 25 mg by mouth 3 (three) times daily as needed for dizziness.    . ondansetron (ZOFRAN-ODT) 8 MG disintegrating tablet TAKE 1 TABLET BY MOUTH EVERY 8 HOURS AS NEEDED FOR NAUSE OR VOMITING 40 tablet 0  . potassium chloride (K-DUR) 10 MEQ tablet Take 1  tablet (10 mEq total) by mouth 3 (three) times daily. (Patient taking differently: Take 10 mEq by mouth 4 (four) times daily. ) 90 tablet 1  . prochlorperazine (COMPAZINE) 10 MG tablet Take 1 tablet (10 mg total) by mouth every 6 (six) hours as needed for nausea or vomiting. 40 tablet 2  . traMADol (ULTRAM) 50 MG tablet Take 1 tablet (50 mg total) by mouth every 6 (six) hours as needed. 30 tablet 0   Current Facility-Administered Medications  Medication  Dose Route Frequency Provider Last Rate Last Dose  . 0.9 %  sodium chloride infusion   Intravenous Once Alla Feeling, NP 250 mL/hr at 04/26/18 1206     Facility-Administered Medications Ordered in Other Visits  Medication Dose Route Frequency Provider Last Rate Last Dose  . 0.9 %  sodium chloride infusion   Intravenous Once [name], MD      . fluorouracil (ADRUCIL) 2,650 mg in sodium chloride 0.9 % 97 mL chemo infusion  1,800 mg/m2 (Treatment Plan Recorded) Intravenous 1 day or 1 dose [name], MD      . heparin lock flush 100 unit/mL  500 Units Intracatheter Once PRN [name], MD      . irinotecan LIPOSOME (ONIVYDE) 60 mg in sodium chloride 0.9 % 500 mL chemo infusion  60 mg Intravenous Once [name], MD      . leucovorin 592 mg in dextrose 5 % 250 mL infusion  400 mg/m2 (Treatment Plan Recorded) Intravenous Once [name], MD      . sodium chloride flush (NS) 0.9 % injection 10 mL  10 mL Intracatheter PRN [name], MD        PHYSICAL EXAMINATION: ECOG PERFORMANCE STATUS: 1 - Symptomatic but completely ambulatory  Vitals:   04/26/18 0928  BP: 112/69  Pulse: 80  Resp: 18  Temp: 98.3 F (36.8 C)  SpO2: 100%   Filed Weights   04/26/18 0928  Weight: 107 lb 14.4 oz (48.9 kg)    GENERAL:alert, no distress and comfortable SKIN: skin color, texture, turgor are normal, no rashes or significant lesions EYES: sclera clear OROPHARYNX:no thrush or ulcers  LYMPH:  no palpable cervical or supraclavicular lymphadenopathy  LUNGS: clear to auscultation with normal breathing effort HEART: regular rate & rhythm, no lower extremity edema ABDOMEN: abdomen soft, non-tender and normal bowel sounds Musculoskeletal: no cyanosis of digits and no clubbing  NEURO: alert & oriented x 3 with fluent speech, no focal motor deficits. Mildly decreased vibratory sense over fingertips bilaterally per tuning fork exam PAC without erythema    LABORATORY DATA:  I have reviewed the data as listed CBC  Latest Ref Rng & Units 04/26/2018 04/11/2018 03/28/2018  WBC 3.9 - 10.3 K/uL 7.5 7.0 2.6(L)  Hemoglobin 11.6 - 15.9 g/dL 9.1(L) 9.3(L) 9.6(L)  Hematocrit 34.8 - 46.6 % 27.6(L) 28.7(L) 27.5(L)  Platelets 145 - 400 K/uL 124(L) 123(L) 121(L)     CMP Latest Ref Rng & Units 04/26/2018 04/11/2018 03/28/2018  Glucose 70 - 99 mg/dL 114(H) 107(H) 110(H)  BUN 6 - 20 mg/dL 19 21(H) 22(H)  Creatinine 0.44 - 1.00 mg/dL 1.34(H) 1.27(H) 1.19(H)  Sodium 135 - 145 mmol/L 141 141 141  Potassium 3.5 - 5.1 mmol/L 3.7 3.7 3.2(L)  Chloride 98 - 111 mmol/L 105 106 106  CO2 22 - 32 mmol/L 26 27 23   Calcium 8.9 - 10.3 mg/dL 9.7 9.4 9.5  Total Protein 6.5 - 8.1 g/dL 7.6 7.7 7.5  Total Bilirubin 0.3 - 1.2 mg/dL 0.7  0.7 0.9  Alkaline Phos 38 - 126 U/L 302(H) 279(H) 266(H)  AST 15 - 41 U/L 62(H) 75(H) 74(H)  ALT 0 - 44 U/L 78(H) 88(H) 71(H)   Results for Morgan Dawson, Morgan Dawson (MRN [id number]) as of 04/05/2018 11:11  Ref. Range 11/15/2017 10:37 12/20/2017 09:33 01/21/2018 09:04 02/23/2018 08:21 03/28/2018 08:34  CA 19-9 Latest Ref Range: 0 - 35 U/mL 33 28 32 25 17   Results for Morgan Dawson, Morgan Dawson (MRN [id number]) as of 04/05/2018 11:11  Ref. Range 08/19/2017 14:13  CEA ([facility]) Latest Ref Range: 0.00 - 5.00 ng/mL 1.31    PATHOLOGY 11/22/2017 Molecular Pathology The following genes were evaluated for sequence changes and exonic deletions/duplications: ALK, APC, ATM, AXIN2, BAP1, BARD1, BLM, BMPR1A, BRCA1, BRCA2, BRIP1, CASR, CDC73, CDH1, CDK4, CDKN1B, CDKN1C, CDKN2A (p14ARF), CDKN2A (p16INK4a), CEBPA, CHEK2, CTNNA1, DICER1, DIS3L2, EPCAM*, FH, FLCN, GATA2, GPC3, GREM1*, HRAS, KIT, MAX, MEN1, MET, MLH1, MSH2, MSH3, MSH6, MUTYH, NBN, NF1, NF2, PALB2, PDGFRA, PHOX2B*, PMS2, POLD1, POLE, POT1, PRKAR1A, PTCH1, PTEN, RAD50, RAD51C, RAD51D, RB1, RECQL4, RET, RUNX1, SDHAF2, SDHB, SDHC, SDHD, SMAD4, SMARCA4, SMARCB1, SMARCE1, STK11, SUFU, TERC, TERT, TMEM127, TP53, TSC1, TSC2, VHL, WRN*, WT1 The following genes were evaluated for  sequence changes only: EGFR*, HOXB13*, MITF*, NTHL1*, SDHA Results are negative unless otherwise indicated    Liver Biopsy  Diagnosis 08/05/17 Liver, needle/core biopsy - CARCINOMA. - SEE COMMENT. Microscopic Comment The malignant cells are positive for cytokeratin 7. They are negative for arginase, CDX2, cytokeratin 20, estrogen receptor, GATA-3, GCDFP, Glypican 3, Hep Par 1, Napsin A, and TTF-1. This immunohistochemical is nonspecific. Possibly primary sources include pancreatobiliary and upper gastrointestinal. Radiologic correlation is necessary. Of note, organ specific markers (GATA-3, GCDFP-breast, TTF-1, Napsin A-lung, and CDX2-colon) are negative. (JBK:ecj 08/09/2017)   Diagnosis 03/07/13 Surgical, cecum, polyp -TUBULAR ADENOMA -NEGATIVE FOR HIGH GRADE DYSPLASIA   PROCEDURES  Colonoscopy by Dr. Paulita Fujita 03/07/13 IMPRESSION:  -One 5 m polyp in the cecum. Resected and retrieved -The distal rectum and anal verge are normal on retroflexion view.  -The examination was otherwise normal.      RADIOGRAPHIC STUDIES: I have personally reviewed the radiological images as listed and agreed with the findings in the report. No results found.   ASSESSMENT & PLAN: Morgan Dawson is a 57 y.o. caucasian female with a history of vertigo and menieres disease, presented with epigastric discomfort, low appetite and 5 pound weight loss   1. 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 Pancreatitis 3. Anorexia, weight loss 4. Hypercalcemia 5. transaminitis  6. Pancytopenia, secondary to chemotherapy  7. Hypokalemia, CKD stage III 8. Genetics, negative  9. Peripheral neuropathy, G2, secondary to chemotherapy (oxaliplatin) 10. Goals of care, full code   Ms. Mayeda appears stable. She completed cycle 4 liposomal irinotecan/5FU. She is tolerating this regimen well overall. She continues to have neuropathy, taking gabapentin 100 mg qHS. I reviewed dosing,  she can increase to 2-3 tabs at night if needed, depending on severity and tolerance; she did not tolerate taking the medication during the day. CBC and CMP stable, adequate for treatment. Cr slightly increased 1.34; I encouraged her to drink more water. K is normal, she continues 40 mEq daily. Will support with additional IVF today with chemo. She will return for f/u and next cycle in 2 weeks, then restage. PET scan has been ordered.   Patient had questions about getting routine colonoscopy done, which I told her she can defer for now. She is scheduled for routine dermatology f/u  and hoping to get a scalp mole biopsied/removed, OK to proceed from onc standpoint. I reviewed her plt count is mildly low, but should not hinder superficial skin biopsy if needed.   PLAN -Labs reviewed, proceed with cycle 5 liposomal irinotecan and 5FU -500 cc NS during today's appointment -Gabapentin 100-300 mg po qHS for neuropathy  -Return in 2 weeks for f/u and cycle 6 -PET 9/24  All questions were answered. The patient knows to call the clinic with any problems, questions or concerns. No barriers to learning was detected. I spent 20 minutes counseling the patient face to face. The total time spent in the appointment was 25 minutes and more than 50% was on counseling and review of test results     Alla Feeling, NP 04/26/18

## 2018-04-26 ENCOUNTER — Inpatient Hospital Stay: Payer: 59

## 2018-04-26 ENCOUNTER — Inpatient Hospital Stay (HOSPITAL_BASED_OUTPATIENT_CLINIC_OR_DEPARTMENT_OTHER): Payer: 59 | Admitting: Nurse Practitioner

## 2018-04-26 ENCOUNTER — Encounter: Payer: Self-pay | Admitting: Nurse Practitioner

## 2018-04-26 ENCOUNTER — Inpatient Hospital Stay: Payer: 59 | Attending: Hematology

## 2018-04-26 VITALS — BP 112/69 | HR 80 | Temp 98.3°F | Resp 18 | Ht 65.0 in | Wt 107.9 lb

## 2018-04-26 DIAGNOSIS — Z79899 Other long term (current) drug therapy: Secondary | ICD-10-CM | POA: Insufficient documentation

## 2018-04-26 DIAGNOSIS — T451X5A Adverse effect of antineoplastic and immunosuppressive drugs, initial encounter: Secondary | ICD-10-CM

## 2018-04-26 DIAGNOSIS — D224 Melanocytic nevi of scalp and neck: Secondary | ICD-10-CM | POA: Diagnosis not present

## 2018-04-26 DIAGNOSIS — Z7189 Other specified counseling: Secondary | ICD-10-CM

## 2018-04-26 DIAGNOSIS — C251 Malignant neoplasm of body of pancreas: Secondary | ICD-10-CM

## 2018-04-26 DIAGNOSIS — Z5111 Encounter for antineoplastic chemotherapy: Secondary | ICD-10-CM | POA: Diagnosis not present

## 2018-04-26 DIAGNOSIS — G620 Drug-induced polyneuropathy: Secondary | ICD-10-CM | POA: Insufficient documentation

## 2018-04-26 DIAGNOSIS — N183 Chronic kidney disease, stage 3 unspecified: Secondary | ICD-10-CM

## 2018-04-26 DIAGNOSIS — C787 Secondary malignant neoplasm of liver and intrahepatic bile duct: Principal | ICD-10-CM

## 2018-04-26 DIAGNOSIS — C259 Malignant neoplasm of pancreas, unspecified: Secondary | ICD-10-CM | POA: Diagnosis present

## 2018-04-26 DIAGNOSIS — Z95828 Presence of other vascular implants and grafts: Secondary | ICD-10-CM

## 2018-04-26 DIAGNOSIS — R944 Abnormal results of kidney function studies: Secondary | ICD-10-CM

## 2018-04-26 DIAGNOSIS — D696 Thrombocytopenia, unspecified: Secondary | ICD-10-CM | POA: Diagnosis not present

## 2018-04-26 DIAGNOSIS — Z5189 Encounter for other specified aftercare: Secondary | ICD-10-CM | POA: Diagnosis not present

## 2018-04-26 DIAGNOSIS — E876 Hypokalemia: Secondary | ICD-10-CM | POA: Diagnosis not present

## 2018-04-26 LAB — CBC WITH DIFFERENTIAL/PLATELET
Basophils Absolute: 0 10*3/uL (ref 0.0–0.1)
Basophils Relative: 0 %
EOS ABS: 0.1 10*3/uL (ref 0.0–0.5)
EOS PCT: 2 %
HCT: 27.6 % — ABNORMAL LOW (ref 34.8–46.6)
HEMOGLOBIN: 9.1 g/dL — AB (ref 11.6–15.9)
LYMPHS ABS: 0.9 10*3/uL (ref 0.9–3.3)
LYMPHS PCT: 12 %
MCH: 34.3 pg — AB (ref 25.1–34.0)
MCHC: 33 g/dL (ref 31.5–36.0)
MCV: 104.2 fL — AB (ref 79.5–101.0)
MONOS PCT: 10 %
Monocytes Absolute: 0.8 10*3/uL (ref 0.1–0.9)
NEUTROS PCT: 76 %
Neutro Abs: 5.7 10*3/uL (ref 1.5–6.5)
Platelets: 124 10*3/uL — ABNORMAL LOW (ref 145–400)
RBC: 2.65 MIL/uL — AB (ref 3.70–5.45)
RDW: 17.1 % — ABNORMAL HIGH (ref 11.2–14.5)
WBC: 7.5 10*3/uL (ref 3.9–10.3)

## 2018-04-26 LAB — COMPREHENSIVE METABOLIC PANEL
ALBUMIN: 3.6 g/dL (ref 3.5–5.0)
ALT: 78 U/L — AB (ref 0–44)
AST: 62 U/L — AB (ref 15–41)
Alkaline Phosphatase: 302 U/L — ABNORMAL HIGH (ref 38–126)
Anion gap: 10 (ref 5–15)
BUN: 19 mg/dL (ref 6–20)
CHLORIDE: 105 mmol/L (ref 98–111)
CO2: 26 mmol/L (ref 22–32)
CREATININE: 1.34 mg/dL — AB (ref 0.44–1.00)
Calcium: 9.7 mg/dL (ref 8.9–10.3)
GFR calc Af Amer: 51 mL/min — ABNORMAL LOW (ref >60)
GFR, EST NON AFRICAN AMERICAN: 44 mL/min — AB (ref >60)
GLUCOSE: 114 mg/dL — AB (ref 70–99)
POTASSIUM: 3.7 mmol/L (ref 3.5–5.1)
SODIUM: 141 mmol/L (ref 135–145)
Total Bilirubin: 0.7 mg/dL (ref 0.3–1.2)
Total Protein: 7.6 g/dL (ref 6.5–8.1)

## 2018-04-26 LAB — LIPASE, BLOOD: LIPASE: 69 U/L — AB (ref 11–51)

## 2018-04-26 MED ORDER — SODIUM CHLORIDE 0.9 % IV SOLN
Freq: Once | INTRAVENOUS | Status: AC
  Filled 2018-04-26: qty 250

## 2018-04-26 MED ORDER — SODIUM CHLORIDE 0.9% FLUSH
10.0000 mL | Freq: Once | INTRAVENOUS | Status: DC
  Filled 2018-04-26: qty 10

## 2018-04-26 MED ORDER — PROCHLORPERAZINE MALEATE 10 MG PO TABS
ORAL_TABLET | ORAL | Status: AC
  Filled 2018-04-26: qty 1

## 2018-04-26 MED ORDER — LEUCOVORIN CALCIUM INJECTION 350 MG
400.0000 mg/m2 | Freq: Once | INTRAVENOUS | Status: AC
  Administered 2018-04-26: 592 mg via INTRAVENOUS
  Filled 2018-04-26: qty 29.6

## 2018-04-26 MED ORDER — HEPARIN SOD (PORK) LOCK FLUSH 100 UNIT/ML IV SOLN
500.0000 [IU] | Freq: Once | INTRAVENOUS | Status: DC | PRN
  Filled 2018-04-26: qty 5

## 2018-04-26 MED ORDER — PROCHLORPERAZINE MALEATE 10 MG PO TABS
10.0000 mg | ORAL_TABLET | Freq: Once | ORAL | Status: AC
  Administered 2018-04-26: 10 mg via ORAL

## 2018-04-26 MED ORDER — SODIUM CHLORIDE 0.9 % IV SOLN
Freq: Once | INTRAVENOUS | Status: AC
  Administered 2018-04-26: 12:00:00 via INTRAVENOUS
  Filled 2018-04-26: qty 250

## 2018-04-26 MED ORDER — SODIUM CHLORIDE 0.9 % IV SOLN
1800.0000 mg/m2 | INTRAVENOUS | Status: DC
  Administered 2018-04-26: 2650 mg via INTRAVENOUS
  Filled 2018-04-26: qty 53

## 2018-04-26 MED ORDER — SODIUM CHLORIDE 0.9 % IV SOLN
60.0000 mg | Freq: Once | INTRAVENOUS | Status: AC
  Administered 2018-04-26: 60 mg via INTRAVENOUS
  Filled 2018-04-26: qty 13.95

## 2018-04-26 MED ORDER — SODIUM CHLORIDE 0.9% FLUSH
10.0000 mL | INTRAVENOUS | Status: DC | PRN
  Filled 2018-04-26: qty 10

## 2018-04-26 NOTE — Patient Instructions (Signed)
[place] Discharge Instructions for Patients Receiving Chemotherapy  Today you received the following chemotherapy agents: Irinotecan liposome (Onivyde), Leucovorin, and Fluorouracil (Adrucil, 5-FU)  To help prevent nausea and vomiting after your treatment, we encourage you to take your nausea medication as directed.    If you develop nausea and vomiting that is not controlled by your nausea medication, call the clinic.   BELOW ARE SYMPTOMS THAT SHOULD BE REPORTED IMMEDIATELY:  *FEVER GREATER THAN 100.5 F  *CHILLS WITH OR WITHOUT FEVER  NAUSEA AND VOMITING THAT IS NOT CONTROLLED WITH YOUR NAUSEA MEDICATION  *UNUSUAL SHORTNESS OF BREATH  *UNUSUAL BRUISING OR BLEEDING  TENDERNESS IN MOUTH AND THROAT WITH OR WITHOUT PRESENCE OF ULCERS  *URINARY PROBLEMS  *BOWEL PROBLEMS  UNUSUAL RASH Items with * indicate a potential emergency and should be followed up as soon as possible.  Feel free to call the clinic should you have any questions or concerns. The clinic phone number is [phone].  Please show the [facility] at check-in to the Emergency Department and triage nurse.

## 2018-04-26 NOTE — Progress Notes (Signed)
Per Regan Rakers Burton-NP: OK for pt to receive treatment today with today's lab work. She would also like pt to receive 500 mLs of NS during course of infusion apt today.

## 2018-04-27 ENCOUNTER — Telehealth: Payer: Self-pay | Admitting: Hematology

## 2018-04-27 LAB — CANCER ANTIGEN 19-9: CAN 19-9: 19 U/mL (ref 0–35)

## 2018-04-27 NOTE — Telephone Encounter (Signed)
No LOS 9/3

## 2018-04-28 ENCOUNTER — Inpatient Hospital Stay: Payer: 59

## 2018-04-28 VITALS — BP 118/66 | HR 72 | Temp 98.3°F | Resp 17

## 2018-04-28 DIAGNOSIS — C787 Secondary malignant neoplasm of liver and intrahepatic bile duct: Principal | ICD-10-CM

## 2018-04-28 DIAGNOSIS — C259 Malignant neoplasm of pancreas, unspecified: Secondary | ICD-10-CM

## 2018-04-28 DIAGNOSIS — C251 Malignant neoplasm of body of pancreas: Secondary | ICD-10-CM

## 2018-04-28 DIAGNOSIS — Z7189 Other specified counseling: Secondary | ICD-10-CM

## 2018-04-28 DIAGNOSIS — Z5111 Encounter for antineoplastic chemotherapy: Secondary | ICD-10-CM | POA: Diagnosis not present

## 2018-04-28 MED ORDER — PEGFILGRASTIM INJECTION 6 MG/0.6ML ~~LOC~~
6.0000 mg | PREFILLED_SYRINGE | Freq: Once | SUBCUTANEOUS | Status: AC
  Administered 2018-04-28: 6 mg via SUBCUTANEOUS

## 2018-04-28 MED ORDER — HEPARIN SOD (PORK) LOCK FLUSH 100 UNIT/ML IV SOLN
500.0000 [IU] | Freq: Once | INTRAVENOUS | Status: AC | PRN
  Administered 2018-04-28: 500 [IU]
  Filled 2018-04-28: qty 5

## 2018-04-28 MED ORDER — PEGFILGRASTIM INJECTION 6 MG/0.6ML ~~LOC~~
PREFILLED_SYRINGE | SUBCUTANEOUS | Status: AC
  Filled 2018-04-28: qty 0.6

## 2018-04-28 MED ORDER — SODIUM CHLORIDE 0.9% FLUSH
10.0000 mL | INTRAVENOUS | Status: DC | PRN
  Administered 2018-04-28: 10 mL
  Filled 2018-04-28: qty 10

## 2018-05-10 DIAGNOSIS — D225 Melanocytic nevi of trunk: Secondary | ICD-10-CM | POA: Diagnosis not present

## 2018-05-10 DIAGNOSIS — D485 Neoplasm of uncertain behavior of skin: Secondary | ICD-10-CM | POA: Diagnosis not present

## 2018-05-10 DIAGNOSIS — L814 Other melanin hyperpigmentation: Secondary | ICD-10-CM | POA: Diagnosis not present

## 2018-05-10 DIAGNOSIS — Z85820 Personal history of malignant melanoma of skin: Secondary | ICD-10-CM | POA: Diagnosis not present

## 2018-05-10 DIAGNOSIS — Z86018 Personal history of other benign neoplasm: Secondary | ICD-10-CM | POA: Diagnosis not present

## 2018-05-10 NOTE — Progress Notes (Signed)
[place]  [phone] [phone]  Clinic Follow up Note   Patient Care Team: [name], MD as PCP - General (Family Medicine) [name], MD as Consulting Physician (Otolaryngology) 05/12/2018  SUMMARY OF ONCOLOGIC HISTORY: Oncology History   Cancer Staging Pancreatic cancer [facility]) Staging form: Exocrine Pancreas, AJCC 8th Edition - Clinical stage from 08/06/2017: Stage IV (cTX, cN0, pM1) - Signed by [name], MD on 08/12/2017       Pancreatic cancer ([place])   07/23/2017 Imaging    US abdomen limited RUQ 07/23/17 IMPRESSION: 1. Cholelithiasis.  No secondary signs of acute cholecystitis. 2. Multiple solid liver masses measuring up to 6.5 cm in the left lobe of the liver, evaluation for metastatic disease is recommended. These results will be called to the ordering clinician or representative by the Radiologist Assistant, and communication documented in the PACS or zVision Dashboard.    07/23/2017 Imaging    CT Abdomen W Contrast 07/23/17 IMPRESSION: 1. Widespread metastatic disease throughout the liver. No clear primary malignancy identified in the abdomen. The pelvis was not imaged. Tissue sampling recommended. 2. Probable adenopathy superior to the pancreatic tail. No evidence of pancreatic mass. 3. Suspected incidental hemangioma inferiorly in the right hepatic lobe. 4. Nonspecific nodularity in the breasts. The patient has undergone recent (03/25/2017 and 04/01/2017) mammography and ultrasound.    07/29/2017 Initial Diagnosis    Metastasis to liver of unknown origin ([place])    07/29/2017 PET scan    PET 07/29/17  IMPRESSION: 1. Numerous bulky liver masses are hypermetabolic compatible with malignancy. 2. Accentuated activity within or along the pancreatic tail, likely represent a primary pancreatic tumor. Consider pancreatic protocol MRI to further work this up. 3. The peripancreatic lymph node shown above the  pancreatic tail is mildly hypermetabolic favoring malignancy. 4.  Prominent stool throughout the colon favors constipation. 5. 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    08/05/2017 Pathology Results    Liver Biopsy  Diagnosis 08/05/17 Liver, needle/core biopsy - CARCINOMA. - SEE COMMENT. Microscopic Comment The malignant cells are positive for cytokeratin 7. They are negative for arginase, CDX2, cytokeratin 20, estrogen receptor, GATA-3, GCDFP, Glypican 3, Hep Par 1, Napsin A, and TTF-1. This immunohistochemical is nonspecific. Possibly primary sources include pancreatobiliary and upper gastrointestinal. Radiologic correlation is necessary. Of note, organ specific markers (GATA-3, GCDFP-breast, TTF-1, Napsin A-lung, and CDX2-colon) are negative. (JBK:ecj 08/09/2017)    08/21/2017 -  Chemotherapy    FOLFIRINOX every 2 weeks starting 08/21/17      10/14/2017 Imaging    CT CAP WO Contrast 10/14/17 IMPRESSION: Evidence of known numerous liver metastases without significant interval change. No other evidence of metastatic disease within the chest, abdomen or pelvis. Cholelithiasis. Tiny pericardial effusion.    12/02/2017 Genetic Testing    Negative for pathogenic mutation.  The genes analyzed were the 83 genes on [facility]'s Multi-Cancer panel (ALK, APC, ATM, AXIN2, BAP1, BARD1, BLM, BMPR1A, BRCA1, BRCA2, BRIP1, CASR, CDC73, CDH1, CDK4, CDKN1B, CDKN1C, CDKN2A, CEBPA, CHEK2, CTNNA1, DICER1, DIS3L2, EGFR, EPCAM, FH, FLCN, GATA2, GPC3, GREM1, HOXB13, HRAS, KIT, MAX, MEN1, MET, MITF, MLH1, MSH2, MSH3, MSH6, MUTYH, NBN, NF1, NF2, NTHL1, PALB2, PDGFRA, PHOX2B, PMS2, POLD1, POLE, POT1, PRKAR1A, PTCH1, PTEN, RAD50, RAD51C, RAD51D, RB1, RECQL4, RET, RUNX1, SDHA, SDHAF2, SDHB, SDHC, SDHD, SMAD4, SMARCA4, SMARCB1, SMARCE1, STK11, SUFU, TERC, TERT, TMEM127, TP53, TSC1, TSC2, VHL, WRN, WT1).    12/17/2017 PET scan    IMPRESSION: 1. Although the liver metastases are still visible on the CT images, they  are significantly smaller and retain no abnormal metabolic activity, consistent with response to therapy. 2. No abnormal activity within the pancreas. 3. No disease progression identified. 4. Cholelithiasis.    02/21/2018 PET scan    02/21/2018 PET Scan  IMPRESSION: 1. There are 2 new small nodules identified within the right lung which measure up to 5 mm. These are too small to reliably characterize by PET-CT, but warrant close interval follow-up. 2. Again noted are multifocal liver metastasis. The target lesion within the left lobe is slightly decreased in size when compared with the previous exam. Similar to previous exam there is no abnormal hypermetabolism above background liver activity identified within the liver lesions. 3. Gallstones.    02/22/2018 -  Chemotherapy    5-FU pump infusion and liposomal irinotecan, every 2 weeks.  First cycle dose reduced due to cytopenia in the travel.     Pancreatic cancer metastasized to liver ([place])   08/11/2017 Initial Diagnosis    Pancreatic cancer metastasized to liver [facility])   CURRENT THERAPY: second line 5-FU pump infusion and liposomal irinotecan every 2 weeks, started on 02/22/2018, dose reduction due to pancytopenia and neuropathy   INTERVAL HISTORY: Morgan Dawson returns for follow up and cycle 6 liposomal-irinotecan/leuc/5FU as scheduled. Completed cycle 5 on 9/3 and udenyca on 9/5. She feels very well. Had 2 areas biopsied at dermatologist, with minimal bleeding. Has lost 1 pound despite good appetite and po intake. No n/v/c/d, or mucositis. Her main concern continues to be neuropathy, which is stable from last cycle. Taking 1 tab gabapentin at night. Has mild occasional left low quadrant/inguinal "twinges" of pain related to hernia. She did tai chi here last night, was "winded" after walking up walkway from the parking lot but otherwise denies dyspnea. Denies fever, chills, cough, chest pain, or leg edema.    MEDICAL HISTORY:  Past Medical  History:  Diagnosis Date  . Genetic testing 12/02/2017   Multi-Cancer panel (83 genes) @ [facility] - No pathogenic mutations detected  . Meniere disease 2016  . Pancreatitis     SURGICAL HISTORY: Past Surgical History:  Procedure Laterality Date  . CERVICAL ABLATION    . ENDOMETRIAL ABLATION  2011  . IR FLUORO GUIDE PORT INSERTION RIGHT  08/12/2017  . IR US GUIDE VASC ACCESS RIGHT  08/12/2017  . [place]    I have reviewed the social history and family history with the patient and they are unchanged from previous note.  ALLERGIES:  has No Known Allergies.  MEDICATIONS:  Current Outpatient Medications  Medication Sig Dispense Refill  . gabapentin (NEURONTIN) 100 MG capsule Take 1 capsule (100 mg total) by mouth 3 (three) times daily. 90 capsule 0  . loperamide (IMODIUM) 2 MG capsule Take 1 capsule (2 mg total) by mouth as needed for diarrhea or loose stools. 30 capsule 0  . loratadine (CLARITIN) 10 MG tablet Take 10 mg by mouth daily as needed for allergies.    . magic mouthwash w/lidocaine SOLN Take 10 mLs by mouth 3 (three) times daily as needed for mouth pain. Swish and spit 10 ML by mouth 3 times daily as needed for mouth pain. 240 mL 0  . meclizine (ANTIVERT) 25 MG tablet Take 25 mg by mouth 3 (three) times daily as needed for dizziness.    . ondansetron (ZOFRAN-ODT) 8 MG disintegrating tablet TAKE 1 TABLET BY MOUTH EVERY 8 HOURS AS NEEDED FOR NAUSE OR VOMITING 40 tablet 0  . potassium chloride (K-DUR) 10 MEQ tablet  Take 1 tablet (10 mEq total) by mouth 3 (three) times daily. (Patient taking differently: Take 10 mEq by mouth 4 (four) times daily. ) 90 tablet 1  . prochlorperazine (COMPAZINE) 10 MG tablet Take 1 tablet (10 mg total) by mouth every 6 (six) hours as needed for nausea or vomiting. 40 tablet 2  . traMADol (ULTRAM) 50 MG tablet Take 1 tablet (50 mg total) by mouth every 6 (six) hours as needed. 30 tablet 0   Current Facility-Administered Medications    Medication Dose Route Frequency Provider Last Rate Last Dose  . 0.9 %  sodium chloride infusion   Intravenous Once Alla Feeling, NP       Facility-Administered Medications Ordered in Other Visits  Medication Dose Route Frequency Provider Last Rate Last Dose  . fluorouracil (ADRUCIL) 2,650 mg in sodium chloride 0.9 % 97 mL chemo infusion  1,800 mg/m2 (Treatment Plan Recorded) Intravenous 1 day or 1 dose [name], MD      . irinotecan LIPOSOME (ONIVYDE) 60 mg in sodium chloride 0.9 % 500 mL chemo infusion  60 mg Intravenous Once [name], MD      . leucovorin 592 mg in dextrose 5 % 250 mL infusion  400 mg/m2 (Treatment Plan Recorded) Intravenous Once [name], MD        PHYSICAL EXAMINATION: ECOG PERFORMANCE STATUS: 1 - Symptomatic but completely ambulatory  Vitals:   05/12/18 1100  BP: 105/71  Pulse: 82  Resp: 18  Temp: 98.5 F (36.9 C)  SpO2: 100%   GENERAL:alert, no distress and comfortable SKIN: no rashes or significant lesions EYES: sclera clear OROPHARYNX:no thrush or ulcers  LYMPH:  no palpable cervical or supraclavicular lymphadenopathy LUNGS: clear to auscultation with normal breathing effort HEART: regular rate & rhythm, no lower extremity edema ABDOMEN:abdomen soft, non-tender and normal bowel sounds Musculoskeletal:no cyanosis of digits and no clubbing  NEURO: alert & oriented x 3 with fluent speech, no focal motor deficits. Mildly decreased vibratory sense over fingertips per tuning fork exam PAC without erythema  LABORATORY DATA:  I have reviewed the data as listed CBC Latest Ref Rng & Units 05/12/2018 04/26/2018 04/11/2018  WBC 3.9 - 10.3 K/uL 8.3 7.5 7.0  Hemoglobin 11.6 - 15.9 g/dL 9.0(L) 9.1(L) 9.3(L)  Hematocrit 34.8 - 46.6 % 26.0(L) 27.6(L) 28.7(L)  Platelets 145 - 400 K/uL 138(L) 124(L) 123(L)     CMP Latest Ref Rng & Units 05/12/2018 04/26/2018 04/11/2018  Glucose 70 - 99 mg/dL 113(H) 114(H) 107(H)  BUN 6 - 20 mg/dL 18 19 21(H)  Creatinine 0.44 -  1.00 mg/dL 1.32(H) 1.34(H) 1.27(H)  Sodium 135 - 145 mmol/L 141 141 141  Potassium 3.5 - 5.1 mmol/L 3.7 3.7 3.7  Chloride 98 - 111 mmol/L 104 105 106  CO2 22 - 32 mmol/L 27 26 27   Calcium 8.9 - 10.3 mg/dL 9.8 9.7 9.4  Total Protein 6.5 - 8.1 g/dL 7.8 7.6 7.7  Total Bilirubin 0.3 - 1.2 mg/dL 0.8 0.7 0.7  Alkaline Phos 38 - 126 U/L 350(H) 302(H) 279(H)  AST 15 - 41 U/L 60(H) 62(H) 75(H)  ALT 0 - 44 U/L 70(H) 78(H) 88(H)      RADIOGRAPHIC STUDIES: I have personally reviewed the radiological images as listed and agreed with the findings in the report. No results found.   ASSESSMENT & PLAN: Morgan Dawson a 57 y.o.caucasian female with a history of vertigo and menieres disease, presented with epigastric discomfort, low appetite and 5 pound weight loss  1. 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 Pancreatitis 3. Anorexia, weight loss 4. Hypercalcemia 5. transaminitis  6. Pancytopenia, secondary to chemotherapy  7. Hypokalemia, CKD stage III 8. Genetics, negative  9. Peripheral neuropathy, G2, secondary to chemotherapy (oxaliplatin) 10. Goals of care, full code   Morgan Dawson appears unchanged. She completed cycle 5 liposomal-irinotecan/leuc/5FU; she continues to tolerate treatment very well overall. Neuropathy is stable, on gabapentin 1 tab qHS. She plans to restart boost for mild weight loss. CBC and CMP reviewed, adequate for treatment. Cr 1.32, stable from last cycle. Will give additional IVF today as per previous cycle. Last CA 19-9 is normal. She will undergo restaging PET after this cycle 6. She requests a call with results. She will return 10/7 after a family trip for f/u with scan discussion and next cycle.   She again had questions about various things. She is OK to get flu vaccine today. She wants to rock babies at church, which is fine to do on case-by-case basis pending her counts. I recommend she consider it towards the end of her cycle, just  before next treatment when her immune system is likely to be stronger. If her counts are low at f/u, we would recommend she avoid it that week. She is also interested in exploring holistic remedies in addition to her chemo treatments, such as acupuncture and herbal supplements, not as treatment options but rather to help her feel well. She is fine to explore these options. I strongly urged her to let us know before starting any herbs/supplements to ensure there are no interactions with her chemo   PLAN: -Labs reviewed, proceed with cycle 6 liposomal-irinotecan/leuc/5FU today -PET 9/24, call patient with results  -Next f/u and chemo 10/7 per request, going on a trip  -500 cc NS over 2 hours with treatment today for Cr 1.32 -flu vaccine today   All questions were answered. The patient knows to call the clinic with any problems, questions or concerns. No barriers to learning was detected. I spent 20 minutes counseling the patient face to face. The total time spent in the appointment was 25 minutes and more than 50% was on counseling and review of test results     Alla Feeling, NP 05/12/18

## 2018-05-12 ENCOUNTER — Inpatient Hospital Stay: Payer: 59

## 2018-05-12 ENCOUNTER — Inpatient Hospital Stay (HOSPITAL_BASED_OUTPATIENT_CLINIC_OR_DEPARTMENT_OTHER): Payer: 59 | Admitting: Nurse Practitioner

## 2018-05-12 ENCOUNTER — Encounter: Payer: Self-pay | Admitting: Nurse Practitioner

## 2018-05-12 ENCOUNTER — Telehealth: Payer: Self-pay | Admitting: Nurse Practitioner

## 2018-05-12 VITALS — BP 105/71 | HR 82 | Temp 98.5°F | Resp 18

## 2018-05-12 DIAGNOSIS — Z95828 Presence of other vascular implants and grafts: Secondary | ICD-10-CM

## 2018-05-12 DIAGNOSIS — C259 Malignant neoplasm of pancreas, unspecified: Secondary | ICD-10-CM

## 2018-05-12 DIAGNOSIS — G620 Drug-induced polyneuropathy: Secondary | ICD-10-CM | POA: Diagnosis not present

## 2018-05-12 DIAGNOSIS — C251 Malignant neoplasm of body of pancreas: Secondary | ICD-10-CM

## 2018-05-12 DIAGNOSIS — T451X5A Adverse effect of antineoplastic and immunosuppressive drugs, initial encounter: Secondary | ICD-10-CM

## 2018-05-12 DIAGNOSIS — R944 Abnormal results of kidney function studies: Secondary | ICD-10-CM

## 2018-05-12 DIAGNOSIS — N183 Chronic kidney disease, stage 3 unspecified: Secondary | ICD-10-CM

## 2018-05-12 DIAGNOSIS — Z5189 Encounter for other specified aftercare: Secondary | ICD-10-CM

## 2018-05-12 DIAGNOSIS — C787 Secondary malignant neoplasm of liver and intrahepatic bile duct: Secondary | ICD-10-CM

## 2018-05-12 DIAGNOSIS — Z79899 Other long term (current) drug therapy: Secondary | ICD-10-CM

## 2018-05-12 DIAGNOSIS — Z7189 Other specified counseling: Secondary | ICD-10-CM

## 2018-05-12 DIAGNOSIS — Z5111 Encounter for antineoplastic chemotherapy: Secondary | ICD-10-CM | POA: Diagnosis not present

## 2018-05-12 LAB — CBC WITH DIFFERENTIAL/PLATELET
BASOS ABS: 0 10*3/uL (ref 0.0–0.1)
BASOS PCT: 1 %
EOS PCT: 3 %
Eosinophils Absolute: 0.2 10*3/uL (ref 0.0–0.5)
HCT: 26 % — ABNORMAL LOW (ref 34.8–46.6)
HEMOGLOBIN: 9 g/dL — AB (ref 11.6–15.9)
Lymphocytes Relative: 7 %
Lymphs Abs: 0.6 10*3/uL — ABNORMAL LOW (ref 0.9–3.3)
MCH: 35.6 pg — AB (ref 25.1–34.0)
MCHC: 34.4 g/dL (ref 31.5–36.0)
MCV: 103.4 fL — ABNORMAL HIGH (ref 79.5–101.0)
Monocytes Absolute: 0.9 10*3/uL (ref 0.1–0.9)
Monocytes Relative: 11 %
NEUTROS PCT: 78 %
Neutro Abs: 6.6 10*3/uL — ABNORMAL HIGH (ref 1.5–6.5)
Platelets: 138 10*3/uL — ABNORMAL LOW (ref 145–400)
RBC: 2.52 MIL/uL — AB (ref 3.70–5.45)
RDW: 16.7 % — ABNORMAL HIGH (ref 11.2–14.5)
WBC: 8.3 10*3/uL (ref 3.9–10.3)

## 2018-05-12 LAB — COMPREHENSIVE METABOLIC PANEL
ALT: 70 U/L — AB (ref 0–44)
AST: 60 U/L — AB (ref 15–41)
Albumin: 3.5 g/dL (ref 3.5–5.0)
Alkaline Phosphatase: 350 U/L — ABNORMAL HIGH (ref 38–126)
Anion gap: 10 (ref 5–15)
BILIRUBIN TOTAL: 0.8 mg/dL (ref 0.3–1.2)
BUN: 18 mg/dL (ref 6–20)
CHLORIDE: 104 mmol/L (ref 98–111)
CO2: 27 mmol/L (ref 22–32)
Calcium: 9.8 mg/dL (ref 8.9–10.3)
Creatinine, Ser: 1.32 mg/dL — ABNORMAL HIGH (ref 0.44–1.00)
GFR, EST AFRICAN AMERICAN: 52 mL/min — AB (ref >60)
GFR, EST NON AFRICAN AMERICAN: 44 mL/min — AB (ref >60)
Glucose, Bld: 113 mg/dL — ABNORMAL HIGH (ref 70–99)
Potassium: 3.7 mmol/L (ref 3.5–5.1)
Sodium: 141 mmol/L (ref 135–145)
TOTAL PROTEIN: 7.8 g/dL (ref 6.5–8.1)

## 2018-05-12 MED ORDER — SODIUM CHLORIDE 0.9% FLUSH
10.0000 mL | Freq: Once | INTRAVENOUS | Status: AC
  Administered 2018-05-12: 10 mL
  Filled 2018-05-12: qty 10

## 2018-05-12 MED ORDER — SODIUM CHLORIDE 0.9 % IV SOLN
60.0000 mg | Freq: Once | INTRAVENOUS | Status: AC
  Administered 2018-05-12: 60 mg via INTRAVENOUS
  Filled 2018-05-12: qty 13.95

## 2018-05-12 MED ORDER — SODIUM CHLORIDE 0.9 % IV SOLN
Freq: Once | INTRAVENOUS | Status: AC
  Administered 2018-05-12: 11:00:00 via INTRAVENOUS
  Filled 2018-05-12: qty 250

## 2018-05-12 MED ORDER — PROCHLORPERAZINE MALEATE 10 MG PO TABS
10.0000 mg | ORAL_TABLET | Freq: Once | ORAL | Status: AC
  Administered 2018-05-12: 10 mg via ORAL

## 2018-05-12 MED ORDER — PROCHLORPERAZINE MALEATE 10 MG PO TABS
ORAL_TABLET | ORAL | Status: AC
  Filled 2018-05-12: qty 1

## 2018-05-12 MED ORDER — LEUCOVORIN CALCIUM INJECTION 350 MG
400.0000 mg/m2 | Freq: Once | INTRAVENOUS | Status: AC
  Administered 2018-05-12: 592 mg via INTRAVENOUS
  Filled 2018-05-12: qty 29.6

## 2018-05-12 MED ORDER — SODIUM CHLORIDE 0.9 % IV SOLN
Freq: Once | INTRAVENOUS | Status: DC
  Filled 2018-05-12: qty 250

## 2018-05-12 MED ORDER — SODIUM CHLORIDE 0.9 % IV SOLN
1800.0000 mg/m2 | INTRAVENOUS | Status: DC
  Administered 2018-05-12: 2650 mg via INTRAVENOUS
  Filled 2018-05-12: qty 53

## 2018-05-12 NOTE — Patient Instructions (Signed)
[facility] Discharge Instructions for Patients Receiving Chemotherapy  Today you received the following chemotherapy agents: Irinotecan liposome (Onivyde), Leucovorin, and Fluorouracil (Adrucil, 5-FU)  To help prevent nausea and vomiting after your treatment, we encourage you to take your nausea medication as directed.    If you develop nausea and vomiting that is not controlled by your nausea medication, call the clinic.   BELOW ARE SYMPTOMS THAT SHOULD BE REPORTED IMMEDIATELY:  *FEVER GREATER THAN 100.5 F  *CHILLS WITH OR WITHOUT FEVER  NAUSEA AND VOMITING THAT IS NOT CONTROLLED WITH YOUR NAUSEA MEDICATION  *UNUSUAL SHORTNESS OF BREATH  *UNUSUAL BRUISING OR BLEEDING  TENDERNESS IN MOUTH AND THROAT WITH OR WITHOUT PRESENCE OF ULCERS  *URINARY PROBLEMS  *BOWEL PROBLEMS  UNUSUAL RASH Items with * indicate a potential emergency and should be followed up as soon as possible.  Feel free to call the clinic should you have any questions or concerns. The clinic phone number is [phone].  Please show the [facility] at check-in to the Emergency Department and triage nurse.

## 2018-05-12 NOTE — Telephone Encounter (Signed)
Scheduled appt per 9/19 los - pt to get an updated schedule next visit.   

## 2018-05-14 ENCOUNTER — Inpatient Hospital Stay: Payer: 59

## 2018-05-14 VITALS — BP 111/74 | HR 72 | Temp 98.3°F | Resp 18

## 2018-05-14 DIAGNOSIS — Z5111 Encounter for antineoplastic chemotherapy: Secondary | ICD-10-CM | POA: Diagnosis not present

## 2018-05-14 MED ORDER — PEGFILGRASTIM INJECTION 6 MG/0.6ML ~~LOC~~
PREFILLED_SYRINGE | SUBCUTANEOUS | Status: AC
  Filled 2018-05-14: qty 0.6

## 2018-05-14 MED ORDER — SODIUM CHLORIDE 0.9% FLUSH
10.0000 mL | INTRAVENOUS | Status: DC | PRN
  Administered 2018-05-14: 10 mL
  Filled 2018-05-14: qty 10

## 2018-05-14 MED ORDER — PEGFILGRASTIM INJECTION 6 MG/0.6ML ~~LOC~~
6.0000 mg | PREFILLED_SYRINGE | Freq: Once | SUBCUTANEOUS | Status: AC
  Administered 2018-05-14: 6 mg via SUBCUTANEOUS

## 2018-05-14 MED ORDER — HEPARIN SOD (PORK) LOCK FLUSH 100 UNIT/ML IV SOLN
500.0000 [IU] | Freq: Once | INTRAVENOUS | Status: AC | PRN
  Administered 2018-05-14: 500 [IU]
  Filled 2018-05-14: qty 5

## 2018-05-17 ENCOUNTER — Encounter (HOSPITAL_COMMUNITY)
Admission: RE | Admit: 2018-05-17 | Discharge: 2018-05-17 | Disposition: A | Discharge status: Home / Self Care | Payer: 59 | Source: Ambulatory Visit | Attending: Hematology | Admitting: Hematology

## 2018-05-17 DIAGNOSIS — C259 Malignant neoplasm of pancreas, unspecified: Secondary | ICD-10-CM | POA: Diagnosis not present

## 2018-05-17 DIAGNOSIS — C251 Malignant neoplasm of body of pancreas: Secondary | ICD-10-CM | POA: Diagnosis not present

## 2018-05-17 LAB — GLUCOSE, CAPILLARY: Glucose-Capillary: 91 mg/dL (ref 70–99)

## 2018-05-17 MED ORDER — FLUDEOXYGLUCOSE F - 18 (FDG) INJECTION
5.3000 | Freq: Once | INTRAVENOUS | Status: AC | PRN
  Administered 2018-05-17: 5.3 via INTRAVENOUS

## 2018-05-19 ENCOUNTER — Telehealth: Payer: Self-pay | Admitting: Hematology

## 2018-05-19 NOTE — Telephone Encounter (Signed)
Patient called in today and requested results from her recent PET scan.  I have reviewed her PET scan images myself, which showed mixed response.  Her small lung nodules has decreased in size, wall of the liver lesions also decreased in size, however the SUV has increased compared to last scan.  Other liver lesions are stable and not hypermetabolic.  No other new lesions.  I think overall stable disease, possible early progression, and I recommend continue current chemo treatment, she is tolerating well.  She voiced good understanding, and appreciated the call.  [name] MD  05/19/2018

## 2018-05-20 ENCOUNTER — Other Ambulatory Visit: Payer: Self-pay

## 2018-05-20 DIAGNOSIS — E876 Hypokalemia: Secondary | ICD-10-CM

## 2018-05-20 MED ORDER — POTASSIUM CHLORIDE ER 10 MEQ PO TBCR: 10.0000 meq | EXTENDED_RELEASE_TABLET | Freq: Four times a day (QID) | ORAL | 2 refills | Status: DC

## 2018-05-20 MED ORDER — POTASSIUM CHLORIDE ER 10 MEQ PO TBCR: 10.0000 meq | EXTENDED_RELEASE_TABLET | Freq: Four times a day (QID) | ORAL | 0 refills | Status: DC

## 2018-05-23 DIAGNOSIS — C259 Malignant neoplasm of pancreas, unspecified: Secondary | ICD-10-CM | POA: Diagnosis not present

## 2018-05-25 ENCOUNTER — Other Ambulatory Visit: Payer: 59

## 2018-05-25 ENCOUNTER — Ambulatory Visit: Payer: 59 | Admitting: Hematology

## 2018-05-25 ENCOUNTER — Ambulatory Visit: Payer: 59

## 2018-05-27 NOTE — Progress Notes (Signed)
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 Cholelithiasis.  No secondary signs of acute cholecystitis. 2. Multiple solid liver masses measuring up to 6.5 cm in the left lobe of the liver, evaluation for metastatic disease is recommended. These results will be called to the ordering clinician or representative by the Radiologist Assistant, and communication documented in the PACS or zVision Dashboard.    07/23/2017 Imaging    CT Abdomen W Contrast 07/23/17 IMPRESSION: 1. Widespread metastatic disease throughout the liver. No clear primary malignancy identified in the abdomen. The pelvis was not imaged. Tissue sampling recommended. 2. Probable adenopathy superior to the pancreatic tail. No evidence of pancreatic mass. 3. Suspected incidental hemangioma inferiorly in the right hepatic lobe. 4. Nonspecific nodularity in the breasts. The patient has undergone recent (03/25/2017 and 04/01/2017) mammography and ultrasound.    07/29/2017 Initial Diagnosis    Metastasis to liver of unknown origin ([place])    07/29/2017 PET scan    PET 07/29/17  IMPRESSION: 1. Numerous bulky liver masses are hypermetabolic compatible with malignancy. 2. Accentuated activity within or along the pancreatic tail, likely represent a primary pancreatic tumor. Consider pancreatic protocol MRI to further work  this up. 3. The peripancreatic lymph node shown above the pancreatic tail is mildly hypermetabolic favoring malignancy. 4.  Prominent stool throughout the colon favors constipation. 5. 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    08/05/2017 Pathology Results    Liver Biopsy  Diagnosis 08/05/17 Liver, needle/core biopsy - CARCINOMA. - SEE COMMENT. Microscopic Comment The malignant cells are positive for cytokeratin 7. They are negative for arginase, CDX2, cytokeratin 20, estrogen receptor, GATA-3, GCDFP, Glypican 3, Hep Par 1, Napsin A, and TTF-1. This immunohistochemical is nonspecific. Possibly primary sources include pancreatobiliary and upper gastrointestinal. Radiologic correlation is necessary. Of note, organ specific markers (GATA-3, GCDFP-breast, TTF-1, Napsin A-lung, and CDX2-colon) are negative. (JBK:ecj 08/09/2017)    08/21/2017 -  Chemotherapy    FOLFIRINOX every 2 weeks starting 08/21/17      10/14/2017 Imaging    CT CAP WO Contrast 10/14/17 IMPRESSION: Evidence of known numerous liver metastases without significant interval change. No other evidence of metastatic disease within the chest, abdomen or pelvis. Cholelithiasis. Tiny pericardial effusion.    12/02/2017 Genetic Testing    Negative for pathogenic mutation.  The genes analyzed were the 83 genes on [facility]'s Multi-Cancer panel (ALK, APC, ATM, AXIN2, BAP1, BARD1, BLM, BMPR1A, BRCA1, BRCA2, BRIP1, CASR, CDC73, CDH1, CDK4, CDKN1B, CDKN1C, CDKN2A, CEBPA, CHEK2, CTNNA1, DICER1, DIS3L2, EGFR, EPCAM, FH, FLCN, GATA2, GPC3, GREM1, HOXB13, HRAS, KIT, MAX, MEN1, MET, MITF, MLH1, MSH2, MSH3, MSH6, MUTYH, NBN, NF1, NF2, NTHL1, PALB2, PDGFRA, PHOX2B, PMS2, POLD1, POLE, POT1, PRKAR1A, PTCH1, PTEN, RAD50, RAD51C, RAD51D, RB1, RECQL4, RET, RUNX1, SDHA, SDHAF2, SDHB, SDHC, SDHD, SMAD4, SMARCA4, SMARCB1, SMARCE1, STK11, SUFU, TERC, TERT, TMEM127, TP53, TSC1, TSC2, VHL, WRN, WT1).    12/17/2017 PET scan    IMPRESSION:  1. Although the  liver metastases are still visible on the CT images, they are significantly smaller and retain no abnormal metabolic activity, consistent with response to therapy. 2. No abnormal activity within the pancreas. 3. No disease progression identified. 4. Cholelithiasis.    02/21/2018 PET scan    02/21/2018 PET Scan  IMPRESSION: 1. There are 2 new small nodules identified within the right lung which measure up to 5 mm. These are too small to reliably characterize by PET-CT, but warrant close interval follow-up. 2. Again noted are multifocal liver metastasis. The target lesion within the left lobe is slightly decreased in size when compared with the previous exam. Similar to previous exam there is no abnormal hypermetabolism above background liver activity identified within the liver lesions. 3. Gallstones.    02/22/2018 -  Chemotherapy    5-FU pump infusion and liposomal irinotecan, every 2 weeks.  First cycle dose reduced due to cytopenia in the travel.    05/17/2018 Imaging    05/17/2018 PET Scan IMPRESSION: 1. Mixed response. The 2 small right lung nodules have decreased in size in the interval. Single focus of increased uptake within the liver is new from 02/21/2018 and concerning for recurrent metabolically active liver metastasis. 2. Splenomegaly.  New from previous exam. 3. Diffusely increased bone marrow activity, likely reflecting treatment related changes.     Pancreatic cancer metastasized to liver ([place])   08/11/2017 Initial Diagnosis    Pancreatic cancer metastasized to liver [facility])      HISTORY OF PRESENTING ILLNESS: 07/30/17  Morgan Dawson 57 y.o. 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 The patient was referred by her PCP Dr. [name]. The patient presents to the clinic today accompanied by husband.  Today the patient reports the weekend before Thanksgiving she had abdominal issues with bloating and cramps. These  symptoms worsened after she would eat. This persisted past Thanksgiving so she went to her PCP.  She presented to her PCP on 07/20/17 for upper abdominal pain, nausea and bloating. She had lab work up and RUQ [place] on 07/23/17. Results showed elevated LFTs (alk phos 227, AST 191, ALT 275) and US showed multiple masses in her liver and pancreatitis. She was put on a bland die due to her pancreatitis. CT AP in 07/23/17 showed diffuse metastatic disease in her liver with no clear primary. She had a PET scan that shows possible metastasis to peripancreatic lymph node along with her known liver masses.   Today she notes she has focused soreness in RUQ. She feels nauseous and has vomited 3 times total in the past 3 weeks. She has lost weight, initially purposefully. She has lost 5-10 pounds. She has been constipated  lately. She takes dulcolax to help. She denies dark stool or GI bleeding. No hematemesis. She does feel more fatigued. She was relatively active 4-5 days a week before last month. She has not needed medication as she feels she has more discomfort (1-2/10). She has not been exercising because she is afraid of making things worse. Her appetite is low from her discomfort and nausea. She notes multiple tender knots on her lower legs that appeared 5 nights ago. There is no itching. She notes she will follow up with Dr. Paulita Fujita this month for her pancreatitis.   Her last mammogram at the [facility] was 04/01/17 and mostly benign. She was recommended to repeat in 6 months. She notes she frequently has cyst in her breast  which were benign before.  Socially she is not working. She usually is active in the gym 3-4 days a week. She has 2 children (son and daughter) at 58yo and 58yo.   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 Her last episode of vertigo was a few months ago. She takes a diuretic and a low salt diet. She has jaw surgery for TMJ issues. She had endometrial ablation due to menorrhagia in  2011. She has only spotted since, no full period. Patient had a colonoscopy in 2014 with one benign polyp removed. Her father had colon cancer in his 89s. Her maternal grandfather had colon cancer in his 74s. Paternal aunt had lymphoma.   CURRENT THERAPY: second line 5-FU pump infusion and liposomal irinotecan every 2 weeks, started on 02/22/2018, dose reduction due to pancytopenia and neuropathy   INTERVAL HISTORY VICTOIRE DEANS is here for a follow up.  She has been following up with NP Lacie in the interim, and was noted to be doing well. PET scan from 05/17/2018 revealed new uptake within liver.  Today, she is here with her husband. She enjoyed her vacation. She feels good and has no new complaints. Her appetite and energy level is good. Her feet still bother her when she walks, and she wears thick socks and takes gabapentin at night to help relief her pain and improve her sleep. The severity of her feet pain depends on her activity. Her fingers are also numb, but she can still perform her daily activities with minimal limitations.   Her nausea and diarrhea are improving. She has not had nausea in a while, but felt nauseated today.   MEDICAL HISTORY:  Past Medical History:  Diagnosis Date  . Genetic testing 12/02/2017   Multi-Cancer panel (83 genes) @ [facility] - No pathogenic mutations detected  . Meniere disease 2016  . Pancreatitis     SURGICAL HISTORY: Past Surgical History:  Procedure Laterality Date  . CERVICAL ABLATION    . ENDOMETRIAL ABLATION  2011  . IR FLUORO GUIDE PORT INSERTION RIGHT  08/12/2017  . IR US GUIDE VASC ACCESS RIGHT  08/12/2017  . MANDIBLE SURGERY  1999    SOCIAL HISTORY: Social History   Socioeconomic History  . Marital status: Married    Spouse name: Not on file  . Number of children: Not on file  . Years of education: Not on file  . Highest education level: Not on file  Occupational History  . Not on file  Social Needs  . Financial resource strain:  Not hard at all  . Food insecurity:    Worry: Never true    Inability: Never true  . Transportation needs:    Medical: No    Non-medical: No  Tobacco Use  . Smoking status: Never Smoker  . Smokeless tobacco: Never Used  Substance and Sexual Activity  . Alcohol use: No    Frequency: Never  . Drug use: No  . Sexual activity: Not Currently  Lifestyle  . Physical activity:    Days per week: 4 days    Minutes per session: 30 min  . Stress: Not at all  Relationships  . Social connections:    Talks on phone: Patient refused    Gets together: Patient refused    Attends religious service: Patient refused    Active member of club or organization: Patient refused    Attends meetings of clubs or organizations: Patient refused    Relationship status: Patient refused  .  Intimate partner violence:    Fear of current or ex partner: Patient refused    Emotionally abused: Patient refused    Physically abused: Patient refused    Forced sexual activity: Patient refused  Other Topics Concern  . Not on file  Social History Narrative  . Not on file    FAMILY HISTORY: Family History  Problem Relation Age of Onset  . Colon cancer Father 19       currently 47  . Colon cancer Maternal Grandfather   . Breast cancer Cousin   . Thyroid cancer Mother 64       facial radiation for acne as teen  . Lymphoma Paternal Aunt 36       deceased 55  . Breast cancer Paternal Aunt     ALLERGIES:  has No Known Allergies.  MEDICATIONS:  Current Outpatient Medications  Medication Sig Dispense Refill  . gabapentin (NEURONTIN) 100 MG capsule Take 1 capsule (100 mg total) by mouth 3 (three) times daily. 90 capsule 0  . loperamide (IMODIUM) 2 MG capsule Take 1 capsule (2 mg total) by mouth as needed for diarrhea or loose stools. 30 capsule 0  . loratadine (CLARITIN) 10 MG tablet Take 10 mg by mouth daily as needed for allergies.    . magic mouthwash w/lidocaine SOLN Take 10 mLs by mouth 3 (three) times  daily as needed for mouth pain. Swish and spit 10 ML by mouth 3 times daily as needed for mouth pain. 240 mL 0  . meclizine (ANTIVERT) 25 MG tablet Take 25 mg by mouth 3 (three) times daily as needed for dizziness.    . ondansetron (ZOFRAN-ODT) 8 MG disintegrating tablet TAKE 1 TABLET BY MOUTH EVERY 8 HOURS AS NEEDED FOR NAUSE OR VOMITING 40 tablet 0  . potassium chloride (K-DUR) 10 MEQ tablet Take 1 tablet (10 mEq total) by mouth 4 (four) times daily. 360 tablet 2  . prochlorperazine (COMPAZINE) 10 MG tablet Take 1 tablet (10 mg total) by mouth every 6 (six) hours as needed for nausea or vomiting. 40 tablet 2  . traMADol (ULTRAM) 50 MG tablet Take 1 tablet (50 mg total) by mouth every 6 (six) hours as needed. 30 tablet 0   Current Facility-Administered Medications  Medication Dose Route Frequency Provider Last Rate Last Dose  . Influenza vac split quadrivalent PF (FLUARIX) injection 0.5 mL  0.5 mL Intramuscular Once [name], MD       Facility-Administered Medications Ordered in Other Visits  Medication Dose Route Frequency Provider Last Rate Last Dose  . fluorouracil (ADRUCIL) 2,650 mg in sodium chloride 0.9 % 97 mL chemo infusion  1,800 mg/m2 (Treatment Plan Recorded) Intravenous 1 day or 1 dose [name], MD      . heparin lock flush 100 unit/mL  500 Units Intracatheter Once PRN [name], MD      . irinotecan LIPOSOME (ONIVYDE) 60 mg in sodium chloride 0.9 % 500 mL chemo infusion  60 mg Intravenous Once [name], MD      . leucovorin 592 mg in dextrose 5 % 250 mL infusion  400 mg/m2 (Treatment Plan Recorded) Intravenous Once [name], MD      . sodium chloride flush (NS) 0.9 % injection 10 mL  10 mL Intracatheter PRN [name], MD        REVIEW OF SYSTEMS:  Constitutional: Denies fevers, chills or abnormal night sweats (+) weight loss (+) better appetite  Eyes: Denies blurriness of vision, double vision or watery eyes  Ears, nose, mouth, throat, and face: Denies mucositis or sore  throat Respiratory: Denies cough, dyspnea or wheezes Cardiovascular: Denies palpitation, chest discomfort or lower extremity swelling Gastrointestinal:  No constipation, diarrhea, or new abdominal pain (+) mild nausea Skin: Denies abnormal skin rashes, tender knots down lower legs bilaterally Lymphatics: Denies new lymphadenopathy or easy bruising Neurological:Denies new weaknesses (+) numbness and tingling in all extremities. Behavioral/Psych: Mood is stable, no new changes  All other systems were reviewed with the patient and are negative.  PHYSICAL EXAMINATION:  ECOG PERFORMANCE STATUS: 1 Today's Vitals   05/30/18 0942 05/30/18 0943  BP: 105/67   Pulse: 88   Resp: 18   Temp: 98.6 F (37 C)   TempSrc: Oral   SpO2: 100%   Weight: 108 lb 6.4 oz (49.2 kg)   Height: 5' 5"  (1.651 m)   PainSc:  0-No pain   GENERAL:alert, no distress and comfortable (+) hair loss SKIN: skin color, texture, turgor are normal, no rashes or significant lesions EYES: normal, conjunctiva are pink and non-injected, sclera clear OROPHARYNX:no exudate, no erythema and lips, buccal mucosa, and tongue normal  NECK: supple, thyroid normal size, non-tender, without nodularity LYMPH:  no palpable lymphadenopathy in the cervical, axillary or inguinal LUNGS: clear to auscultation and percussion with normal breathing effort HEART: regular rate & rhythm and no murmurs and no lower extremity edema ABDOMEN:abdomen soft, normal bowel sounds. No hepatomegaly or tenderness on palpation (+) small hernia in the left inguial area Musculoskeletal:no cyanosis of digits and no clubbing  PSYCH: alert & oriented x 3 with fluent speech NEURO: no focal motor deficit. (+) decreased vibration on all extremities, worse in LLs  LABORATORY DATA:  I have reviewed the data as listed CBC Latest Ref Rng & Units 05/30/2018 05/12/2018 04/26/2018  WBC 3.9 - 10.3 K/uL 6.0 8.3 7.5  Hemoglobin 11.6 - 15.9 g/dL 8.1(L) 9.0(L) 9.1(L)  Hematocrit  34.8 - 46.6 % 23.3(L) 26.0(L) 27.6(L)  Platelets 145 - 400 K/uL 123(L) 138(L) 124(L)    CMP Latest Ref Rng & Units 05/30/2018 05/12/2018 04/26/2018  Glucose 70 - 99 mg/dL 113(H) 113(H) 114(H)  BUN 6 - 20 mg/dL 21(H) 18 19  Creatinine 0.44 - 1.00 mg/dL 1.28(H) 1.32(H) 1.34(H)  Sodium 135 - 145 mmol/L 140 141 141  Potassium 3.5 - 5.1 mmol/L 3.7 3.7 3.7  Chloride 98 - 111 mmol/L 104 104 105  CO2 22 - 32 mmol/L 27 27 26   Calcium 8.9 - 10.3 mg/dL 9.6 9.8 9.7  Total Protein 6.5 - 8.1 g/dL 7.6 7.8 7.6  Total Bilirubin 0.3 - 1.2 mg/dL 0.9 0.8 0.7  Alkaline Phos 38 - 126 U/L 370(H) 350(H) 302(H)  AST 15 - 41 U/L 62(H) 60(H) 62(H)  ALT 0 - 44 U/L 76(H) 70(H) 78(H)    Tumor Markers Results for HAZELENE, DOTEN (MRN [id number]) as of 05/27/2018 10:19  Ref. Range 12/20/2017 09:33 01/21/2018 09:04 02/23/2018 08:21 03/28/2018 08:34 04/26/2018 08:55  CA 19-9 Latest Ref Range: 0 - 35 U/mL 28 32 25 17 19     PATHOLOGY 11/22/2017 Molecular Pathology The following genes were evaluated for sequence changes and exonic deletions/duplications: ALK, APC, ATM, AXIN2, BAP1, BARD1, BLM, BMPR1A, BRCA1, BRCA2, BRIP1, CASR, CDC73, CDH1, CDK4, CDKN1B, CDKN1C, CDKN2A (p14ARF), CDKN2A (p16INK4a), CEBPA, CHEK2, CTNNA1, DICER1, DIS3L2, EPCAM*, FH, FLCN, GATA2, GPC3, GREM1*, HRAS, KIT, MAX, MEN1, MET, MLH1, MSH2, MSH3, MSH6, MUTYH, NBN, NF1, NF2, PALB2, PDGFRA, PHOX2B*, PMS2, POLD1, POLE, POT1, PRKAR1A, PTCH1, PTEN, RAD50, RAD51C, RAD51D, RB1, RECQL4, RET, RUNX1, SDHAF2, SDHB,  SDHC, SDHD, SMAD4, SMARCA4, SMARCB1, SMARCE1, STK11, SUFU, TERC, TERT, TMEM127, TP53, TSC1, TSC2, VHL, WRN*, WT1 The following genes were evaluated for sequence changes only: EGFR*, HOXB13*, MITF*, NTHL1*, SDHA Results are negative unless otherwise indicated    Liver Biopsy  Diagnosis 08/05/17 Liver, needle/core biopsy - CARCINOMA. - SEE COMMENT. Microscopic Comment The malignant cells are positive for cytokeratin 7. They are negative for arginase,  CDX2, cytokeratin 20, estrogen receptor, GATA-3, GCDFP, Glypican 3, Hep Par 1, Napsin A, and TTF-1. This immunohistochemical is nonspecific. Possibly primary sources include pancreatobiliary and upper gastrointestinal. Radiologic correlation is necessary. Of note, organ specific markers (GATA-3, GCDFP-breast, TTF-1, Napsin A-lung, and CDX2-colon) are negative. (JBK:ecj 08/09/2017)   Diagnosis 03/07/13 Surgical, cecum, polyp -TUBULAR ADENOMA -NEGATIVE FOR HIGH GRADE DYSPLASIA   PROCEDURES  Colonoscopy by Dr. Paulita Fujita 03/07/13 IMPRESSION:  -One 5 m polyp in the cecum. Resected and retrieved -The distal rectum and anal verge are normal on retroflexion view.  -The examination was otherwise normal.    RADIOGRAPHIC STUDIES: I have personally reviewed the radiological images as listed and agreed with the findings in the report.  05/17/2018 PET Scan IMPRESSION: 1. Mixed response. The 2 small right lung nodules have decreased in size in the interval. Single focus of increased uptake within the liver is new from 02/21/2018 and concerning for recurrent metabolically active liver metastasis. 2. Splenomegaly.  New from previous exam. 3. Diffusely increased bone marrow activity, likely reflecting treatment related changes.  02/21/2018 PET Scan  IMPRESSION: 1. There are 2 new small nodules identified within the right lung which measure up to 5 mm. These are too small to reliably characterize by PET-CT, but warrant close interval follow-up. 2. Again noted are multifocal liver metastasis. The target lesion within the left lobe is slightly decreased in size when compared with the previous exam. Similar to previous exam there is no abnormal hypermetabolism above background liver activity identified within the liver lesions. 3. Gallstones.  PET Scan 12/17/17 IMPRESSION: 1. Although the liver metastases are still visible on the CT images, they are significantly smaller and retain no abnormal  metabolic activity, consistent with response to therapy. 2. No abnormal activity within the pancreas. 3. No disease progression identified. 4. Cholelithiasis.  CT CAP WO Contrast 10/14/17 IMPRESSION: Evidence of known numerous liver metastases without significant interval change. No other evidence of metastatic disease within the chest, abdomen or pelvis. Cholelithiasis. Tiny pericardial effusion.   ASSESSMENT & PLAN:  Morgan Dawson is a 57 y.o. caucasian female with a history of vertigo and menieres disease, presented with epigastric discomfort, low appetite and 5 pound weight loss   1. 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  07/29/17 PET scan shows diffuse metastatic disease in her enlarged liver.  This is most consistent with diffuse liver metastasis.  -Her liver biopsy confirmed metastatic adenocarcinoma, most consistent with biliary or pancreatic primary this was discussed with patient.  -After further reviewing her 07/29/17 PET in the GI Tumor Board, the initial uptake in the duodenum is felt to be in the pancrease tail, which represents the primary tumor.  The consensus from GI tumor board is this is most consistent with metastatic pancreatic adenocarcinoma to liver.  We felt she does not need further imaging or EUS to confirm the primary site.  -Given her metastatic stage IV disease, her metastatic cancer is unfortunately not curable, and the goal  of therapy is palliative, to prolong her life and improve her quality of life.  -Goal of therapy is to control the disease.  -she started first line FOLFIRINOX on 08/21/17.  -She has been tolerating chemotherapy moderately well, with some nausea, cold sensitivity, diarrhea, and fatigue, she has developed slightly worsening renal function, has received IV fluids intermittently  -CT CAP WO Contrast  from 10/14/17 reveals stable disease, liver metastases without significant interval change. No other evidence of metastatic disease within the chest, abdomen or pelvis..  -Her CA 19.9 continues to decrease, corresponding to her good response to chemotherapy. -her PET scan from 12/17/17 revealed that her liver mets are significantly smaller and retain no abnormal metabolic activity. No new lesions identified. I discussed these results with the pt and her husband. They are very pleased.  -She has developed persistent peripheral neuropathy, grade 2, I stopped her oxaliplatin on 02/10/2018 -I reviewed her restaging PET scan from February 21, 2018, which showed a few new small lung nodules up to 5 mm, not impressive.  No hypermetabolic lesions in the liver or anywhere else. She has had excellent response to chemo so far -her treatment was switched to 5-FU pump infusion and liposomal irinotecan, every 2 weeks.  Due to her cytopenia, I reduced the dose, she has been tolerating very well. -She previously had mild neutropenia with ANC 1.2, mild thrombocytopenia with platelet 91,000, no clinical bleeding.  I proceeded second cycle 5-FU and liposomal irinotecan, with further dose reduction of 5-FU due to her thrombocytopenia.  I  added on Neulasta on day 3. -We again discussed with the goal of therapy is palliative, for disease control and to prolong her life.  She feels very well, understands we will continue chemotherapy as long as it is working and she is tolerating. -We previously discussed the duration of chemotherapy, likely will be continuous.  Okay to take chemo break for vacation or other reason.  Patient is here for agreement with the plan, she has felt much better since we de-escalate her chemo. -PET scan from 05/17/2018 revealed overall stable disease, a few small lung nodules have decreased in size, 1 of the liver lesions is slightly more hypermetabolic, although size has decreased slightly.  No other new lesions.   I recommend her to continue current regiment, with close follow-up scan in 2 months.  I discussed with the patient and she agrees with the plan.  -Lab reviewed, adequate for treatment. -F/u with labs in 2 weeks before next cycle chemo  -I have requested her liver biopsy to be tested for foundation one, to see if she has any targetable mutations in her tumor.   2. Pancreatitis -Found by 07/23/17 CT scan, likely related to her cancer.  -On bland diet -I previously suggested Prilosec and to continue small meals with bland diet. If needed I can give pain medication. -Abdominal pain is mild, no need for pain medication at this time.    3. Anorexia, weight loss -She has anxiety about eating due to emesis.  -I previously encouraged her to eat more small meals with bland or liquid diet. She can increase her intake of ensure boost -Follow-up with dietitian -weight stable lately   4. Hypercalcemia -Secondary to her underlying malignancy -Received Zometa and IV hydration in December 2018, hypercalcemia has resolved. -She restarted dairy products. -She had cramps, her Ca was good to restart calcium and magnesium.  5.  Transaminitis -Due to her underlying liver metastasis and chemo  -Improved since she started chemo,  slightly worse again lately  -Monitoring  6. 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 Hypokalemia, CKD stage III -She takes 10-40 mEq on a given day depending on her ability to swallow pills. K was 3.0 10/04/17. She does not have heavy GI output. I recommended she take 20 mEq daily consistently.  -Creatinine continues to increase; she appears adequately hydrated. Could be  related to chemotherapy. Will check mag to make sure it is normal. Will obtain uric acid to r/o tumor lysis.  -will check labs closely, add on with pump d/c on day 3 - I stopped her spironolactone on 11/01/17 due to high Cr - US renal from 11/08/17 revealed hyperechoic renal cortex bilaterally without hydronephrosis -continue KCL supplement, she takes 40 mEq daily -CMP pending today  8. Genetics -She previously met with genetic counselor and test was not recommended  -I informed pt today that testing will be beneficial to know if she will be a candidate for other drugs in the future if she is tested positive for Lynch Syndrome or BRCA1/BRCA2 mutation carrier  -Her genetic testing (83 cancer related genes) was negative  9.  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 - Her neuropathy is getting worse and is bothering her. I prescribed Gabapentin for her and monitor.  She will start at 100 mg at bedtime, and gradually increase dose to 300, and she will use during the day if needed and if she tolerates.   10. Goal of care discussion  -We previously discussed the incurable nature of her cancer, and the overall poor prognosis, especially if she does not have good response to chemotherapy or progress on chemo -The patient understands the goal of care is palliative. -I previously recommended DNR/DNI, she will think about it    PLAN: -Recent PET scan reviewed, overall stable disease, continue current chemo therapy -f/u in 2 weeks before next cycle chemo -I have requested her prior liver biopsy to be tested for FO   All questions were answered. The patient knows to call the clinic with any problems, questions or concerns. I spent 20 minutes counseling the patient face to face. The total time spent in the appointment  was 25 minutes and more than 50% was on counseling.  I have reviewed the above documentation for accuracy and completeness, and I agree with the above.  Dierdre Searles Dweik am acting as scribe for Dr. [name].  I have reviewed the above documentation for accuracy and completeness, and I agree with the above.    [name], MD 05/30/2018

## 2018-05-30 ENCOUNTER — Inpatient Hospital Stay: Payer: 59

## 2018-05-30 ENCOUNTER — Inpatient Hospital Stay (HOSPITAL_BASED_OUTPATIENT_CLINIC_OR_DEPARTMENT_OTHER): Payer: 59 | Admitting: Hematology

## 2018-05-30 ENCOUNTER — Encounter: Payer: Self-pay | Admitting: Hematology

## 2018-05-30 ENCOUNTER — Inpatient Hospital Stay: Payer: 59 | Attending: Hematology

## 2018-05-30 VITALS — BP 105/67 | HR 88 | Temp 98.6°F | Resp 18 | Ht 65.0 in | Wt 108.4 lb

## 2018-05-30 DIAGNOSIS — N183 Chronic kidney disease, stage 3 unspecified: Secondary | ICD-10-CM

## 2018-05-30 DIAGNOSIS — C787 Secondary malignant neoplasm of liver and intrahepatic bile duct: Secondary | ICD-10-CM

## 2018-05-30 DIAGNOSIS — Z23 Encounter for immunization: Secondary | ICD-10-CM | POA: Diagnosis not present

## 2018-05-30 DIAGNOSIS — C259 Malignant neoplasm of pancreas, unspecified: Secondary | ICD-10-CM

## 2018-05-30 DIAGNOSIS — C251 Malignant neoplasm of body of pancreas: Secondary | ICD-10-CM

## 2018-05-30 DIAGNOSIS — Z5111 Encounter for antineoplastic chemotherapy: Secondary | ICD-10-CM | POA: Diagnosis not present

## 2018-05-30 DIAGNOSIS — R110 Nausea: Secondary | ICD-10-CM

## 2018-05-30 DIAGNOSIS — T451X5A Adverse effect of antineoplastic and immunosuppressive drugs, initial encounter: Secondary | ICD-10-CM

## 2018-05-30 DIAGNOSIS — R740 Nonspecific elevation of levels of transaminase and lactic acid dehydrogenase [LDH]: Secondary | ICD-10-CM

## 2018-05-30 DIAGNOSIS — Z95828 Presence of other vascular implants and grafts: Secondary | ICD-10-CM

## 2018-05-30 DIAGNOSIS — Z7189 Other specified counseling: Secondary | ICD-10-CM

## 2018-05-30 DIAGNOSIS — D61810 Antineoplastic chemotherapy induced pancytopenia: Secondary | ICD-10-CM | POA: Diagnosis not present

## 2018-05-30 DIAGNOSIS — E876 Hypokalemia: Secondary | ICD-10-CM

## 2018-05-30 DIAGNOSIS — K859 Acute pancreatitis without necrosis or infection, unspecified: Secondary | ICD-10-CM

## 2018-05-30 DIAGNOSIS — Z5189 Encounter for other specified aftercare: Secondary | ICD-10-CM | POA: Diagnosis not present

## 2018-05-30 DIAGNOSIS — D696 Thrombocytopenia, unspecified: Secondary | ICD-10-CM

## 2018-05-30 LAB — CBC WITH DIFFERENTIAL/PLATELET
BASOS ABS: 0.1 10*3/uL (ref 0.0–0.1)
Basophils Relative: 1 %
EOS PCT: 4 %
Eosinophils Absolute: 0.2 10*3/uL (ref 0.0–0.5)
HCT: 23.3 % — ABNORMAL LOW (ref 34.8–46.6)
Hemoglobin: 8.1 g/dL — ABNORMAL LOW (ref 11.6–15.9)
LYMPHS PCT: 8 %
Lymphs Abs: 0.5 10*3/uL — ABNORMAL LOW (ref 0.9–3.3)
MCH: 35.9 pg — ABNORMAL HIGH (ref 25.1–34.0)
MCHC: 34.6 g/dL (ref 31.5–36.0)
MCV: 103.5 fL — AB (ref 79.5–101.0)
Monocytes Absolute: 0.7 10*3/uL (ref 0.1–0.9)
Monocytes Relative: 12 %
NEUTROS ABS: 4.6 10*3/uL (ref 1.5–6.5)
Neutrophils Relative %: 75 %
PLATELETS: 123 10*3/uL — AB (ref 145–400)
RBC: 2.25 MIL/uL — AB (ref 3.70–5.45)
RDW: 15.2 % — ABNORMAL HIGH (ref 11.2–14.5)
WBC: 6 10*3/uL (ref 3.9–10.3)

## 2018-05-30 LAB — COMPREHENSIVE METABOLIC PANEL
ALT: 76 U/L — ABNORMAL HIGH (ref 0–44)
ANION GAP: 9 (ref 5–15)
AST: 62 U/L — ABNORMAL HIGH (ref 15–41)
Albumin: 3.3 g/dL — ABNORMAL LOW (ref 3.5–5.0)
Alkaline Phosphatase: 370 U/L — ABNORMAL HIGH (ref 38–126)
BILIRUBIN TOTAL: 0.9 mg/dL (ref 0.3–1.2)
BUN: 21 mg/dL — AB (ref 6–20)
CO2: 27 mmol/L (ref 22–32)
Calcium: 9.6 mg/dL (ref 8.9–10.3)
Chloride: 104 mmol/L (ref 98–111)
Creatinine, Ser: 1.28 mg/dL — ABNORMAL HIGH (ref 0.44–1.00)
GFR, EST AFRICAN AMERICAN: 54 mL/min — AB (ref >60)
GFR, EST NON AFRICAN AMERICAN: 46 mL/min — AB (ref >60)
Glucose, Bld: 113 mg/dL — ABNORMAL HIGH (ref 70–99)
POTASSIUM: 3.7 mmol/L (ref 3.5–5.1)
Sodium: 140 mmol/L (ref 135–145)
TOTAL PROTEIN: 7.6 g/dL (ref 6.5–8.1)

## 2018-05-30 LAB — LIPASE, BLOOD: Lipase: 101 U/L — ABNORMAL HIGH (ref 11–51)

## 2018-05-30 MED ORDER — PROCHLORPERAZINE MALEATE 10 MG PO TABS
ORAL_TABLET | ORAL | Status: AC
  Filled 2018-05-30: qty 1

## 2018-05-30 MED ORDER — SODIUM CHLORIDE 0.9 % IV SOLN
60.0000 mg | Freq: Once | INTRAVENOUS | Status: AC
  Administered 2018-05-30: 60 mg via INTRAVENOUS
  Filled 2018-05-30: qty 13.95

## 2018-05-30 MED ORDER — SODIUM CHLORIDE 0.9 % IV SOLN
Freq: Once | INTRAVENOUS | Status: AC
  Administered 2018-05-30: 11:00:00 via INTRAVENOUS
  Filled 2018-05-30: qty 250

## 2018-05-30 MED ORDER — SODIUM CHLORIDE 0.9 % IV SOLN
1800.0000 mg/m2 | INTRAVENOUS | Status: DC
  Administered 2018-05-30: 2650 mg via INTRAVENOUS
  Filled 2018-05-30: qty 53

## 2018-05-30 MED ORDER — SODIUM CHLORIDE 0.9% FLUSH
10.0000 mL | INTRAVENOUS | Status: DC | PRN
  Filled 2018-05-30: qty 10

## 2018-05-30 MED ORDER — PROCHLORPERAZINE MALEATE 10 MG PO TABS
10.0000 mg | ORAL_TABLET | Freq: Once | ORAL | Status: AC
  Administered 2018-05-30: 10 mg via ORAL

## 2018-05-30 MED ORDER — SODIUM CHLORIDE 0.9% FLUSH
10.0000 mL | Freq: Once | INTRAVENOUS | Status: AC
  Administered 2018-05-30: 10 mL
  Filled 2018-05-30: qty 10

## 2018-05-30 MED ORDER — HEPARIN SOD (PORK) LOCK FLUSH 100 UNIT/ML IV SOLN
500.0000 [IU] | Freq: Once | INTRAVENOUS | Status: DC | PRN
  Filled 2018-05-30: qty 5

## 2018-05-30 MED ORDER — INFLUENZA VAC SPLIT QUAD 0.5 ML IM SUSY: 0.5000 mL | PREFILLED_SYRINGE | Freq: Once | INTRAMUSCULAR | Status: DC

## 2018-05-30 MED ORDER — LEUCOVORIN CALCIUM INJECTION 350 MG
400.0000 mg/m2 | Freq: Once | INTRAVENOUS | Status: AC
  Administered 2018-05-30: 592 mg via INTRAVENOUS
  Filled 2018-05-30: qty 29.6

## 2018-05-30 NOTE — Patient Instructions (Signed)
[facility] Discharge Instructions for Patients Receiving Chemotherapy  Today you received the following chemotherapy agents: Irinotecan liposome (Onivyde), Leucovorin, and Fluorouracil (Adrucil, 5-FU)  To help prevent nausea and vomiting after your treatment, we encourage you to take your nausea medication as directed.    If you develop nausea and vomiting that is not controlled by your nausea medication, call the clinic.   BELOW ARE SYMPTOMS THAT SHOULD BE REPORTED IMMEDIATELY:  *FEVER GREATER THAN 100.5 F  *CHILLS WITH OR WITHOUT FEVER  NAUSEA AND VOMITING THAT IS NOT CONTROLLED WITH YOUR NAUSEA MEDICATION  *UNUSUAL SHORTNESS OF BREATH  *UNUSUAL BRUISING OR BLEEDING  TENDERNESS IN MOUTH AND THROAT WITH OR WITHOUT PRESENCE OF ULCERS  *URINARY PROBLEMS  *BOWEL PROBLEMS  UNUSUAL RASH Items with * indicate a potential emergency and should be followed up as soon as possible.  Feel free to call the clinic should you have any questions or concerns. The clinic phone number is [phone].  Please show the [facility] at check-in to the Emergency Department and triage nurse.

## 2018-05-31 ENCOUNTER — Telehealth: Payer: Self-pay | Admitting: Hematology

## 2018-05-31 LAB — CANCER ANTIGEN 19-9: CAN 19-9: 19 U/mL (ref 0–35)

## 2018-05-31 NOTE — Telephone Encounter (Signed)
Appts scheduled and patient notified and will pick up new schedule at next visit per 10/7 los

## 2018-06-01 ENCOUNTER — Inpatient Hospital Stay: Payer: 59

## 2018-06-01 VITALS — BP 112/72 | HR 72 | Temp 98.6°F | Resp 17

## 2018-06-01 DIAGNOSIS — C251 Malignant neoplasm of body of pancreas: Secondary | ICD-10-CM

## 2018-06-01 DIAGNOSIS — Z7189 Other specified counseling: Secondary | ICD-10-CM

## 2018-06-01 DIAGNOSIS — C787 Secondary malignant neoplasm of liver and intrahepatic bile duct: Secondary | ICD-10-CM

## 2018-06-01 DIAGNOSIS — Z23 Encounter for immunization: Secondary | ICD-10-CM

## 2018-06-01 DIAGNOSIS — Z5111 Encounter for antineoplastic chemotherapy: Secondary | ICD-10-CM | POA: Diagnosis not present

## 2018-06-01 DIAGNOSIS — C259 Malignant neoplasm of pancreas, unspecified: Secondary | ICD-10-CM

## 2018-06-01 MED ORDER — PEGFILGRASTIM INJECTION 6 MG/0.6ML ~~LOC~~
6.0000 mg | PREFILLED_SYRINGE | Freq: Once | SUBCUTANEOUS | Status: AC
  Administered 2018-06-01: 6 mg via SUBCUTANEOUS

## 2018-06-01 MED ORDER — PEGFILGRASTIM INJECTION 6 MG/0.6ML ~~LOC~~
PREFILLED_SYRINGE | SUBCUTANEOUS | Status: AC
  Filled 2018-06-01: qty 0.6

## 2018-06-01 MED ORDER — INFLUENZA VAC SPLIT QUAD 0.5 ML IM SUSY
0.5000 mL | PREFILLED_SYRINGE | Freq: Once | INTRAMUSCULAR | Status: AC
  Administered 2018-06-01: 0.5 mL via INTRAMUSCULAR

## 2018-06-01 MED ORDER — HEPARIN SOD (PORK) LOCK FLUSH 100 UNIT/ML IV SOLN
500.0000 [IU] | Freq: Once | INTRAVENOUS | Status: AC | PRN
  Administered 2018-06-01: 500 [IU]
  Filled 2018-06-01: qty 5

## 2018-06-01 MED ORDER — SODIUM CHLORIDE 0.9% FLUSH
10.0000 mL | INTRAVENOUS | Status: DC | PRN
  Administered 2018-06-01: 10 mL
  Filled 2018-06-01: qty 10

## 2018-06-03 ENCOUNTER — Other Ambulatory Visit: Payer: Self-pay

## 2018-06-03 DIAGNOSIS — C251 Malignant neoplasm of body of pancreas: Secondary | ICD-10-CM

## 2018-06-03 MED ORDER — GABAPENTIN 100 MG PO CAPS: 100.0000 mg | ORAL_CAPSULE | Freq: Three times a day (TID) | ORAL | 2 refills | Status: DC

## 2018-06-08 ENCOUNTER — Telehealth: Payer: Self-pay

## 2018-06-08 ENCOUNTER — Other Ambulatory Visit: Payer: Self-pay

## 2018-06-08 DIAGNOSIS — C259 Malignant neoplasm of pancreas, unspecified: Secondary | ICD-10-CM

## 2018-06-08 DIAGNOSIS — C787 Secondary malignant neoplasm of liver and intrahepatic bile duct: Principal | ICD-10-CM

## 2018-06-08 DIAGNOSIS — E876 Hypokalemia: Secondary | ICD-10-CM

## 2018-06-08 MED ORDER — POTASSIUM CHLORIDE ER 10 MEQ PO TBCR: 10.0000 meq | EXTENDED_RELEASE_TABLET | Freq: Four times a day (QID) | ORAL | 2 refills | Status: DC

## 2018-06-08 NOTE — Telephone Encounter (Signed)
Faxed refill for Potassium to [facility]

## 2018-06-09 ENCOUNTER — Other Ambulatory Visit: Payer: Self-pay | Admitting: Emergency Medicine

## 2018-06-09 DIAGNOSIS — C787 Secondary malignant neoplasm of liver and intrahepatic bile duct: Secondary | ICD-10-CM

## 2018-06-09 DIAGNOSIS — C259 Malignant neoplasm of pancreas, unspecified: Secondary | ICD-10-CM

## 2018-06-09 DIAGNOSIS — E876 Hypokalemia: Secondary | ICD-10-CM

## 2018-06-11 DIAGNOSIS — C259 Malignant neoplasm of pancreas, unspecified: Secondary | ICD-10-CM | POA: Diagnosis not present

## 2018-06-13 ENCOUNTER — Inpatient Hospital Stay: Payer: 59

## 2018-06-13 ENCOUNTER — Inpatient Hospital Stay (HOSPITAL_BASED_OUTPATIENT_CLINIC_OR_DEPARTMENT_OTHER): Payer: 59 | Admitting: Hematology

## 2018-06-13 ENCOUNTER — Encounter: Payer: Self-pay | Admitting: Hematology

## 2018-06-13 VITALS — BP 102/68 | HR 84 | Temp 98.4°F | Resp 18 | Ht 65.0 in | Wt 106.3 lb

## 2018-06-13 DIAGNOSIS — C259 Malignant neoplasm of pancreas, unspecified: Secondary | ICD-10-CM

## 2018-06-13 DIAGNOSIS — C787 Secondary malignant neoplasm of liver and intrahepatic bile duct: Principal | ICD-10-CM

## 2018-06-13 DIAGNOSIS — D696 Thrombocytopenia, unspecified: Secondary | ICD-10-CM

## 2018-06-13 DIAGNOSIS — G620 Drug-induced polyneuropathy: Secondary | ICD-10-CM

## 2018-06-13 DIAGNOSIS — D61810 Antineoplastic chemotherapy induced pancytopenia: Secondary | ICD-10-CM | POA: Diagnosis not present

## 2018-06-13 DIAGNOSIS — C251 Malignant neoplasm of body of pancreas: Secondary | ICD-10-CM

## 2018-06-13 DIAGNOSIS — Z7189 Other specified counseling: Secondary | ICD-10-CM

## 2018-06-13 DIAGNOSIS — N183 Chronic kidney disease, stage 3 (moderate): Secondary | ICD-10-CM

## 2018-06-13 DIAGNOSIS — R740 Nonspecific elevation of levels of transaminase and lactic acid dehydrogenase [LDH]: Secondary | ICD-10-CM

## 2018-06-13 DIAGNOSIS — R630 Anorexia: Secondary | ICD-10-CM

## 2018-06-13 DIAGNOSIS — Z95828 Presence of other vascular implants and grafts: Secondary | ICD-10-CM

## 2018-06-13 DIAGNOSIS — Z5111 Encounter for antineoplastic chemotherapy: Secondary | ICD-10-CM | POA: Diagnosis not present

## 2018-06-13 DIAGNOSIS — E876 Hypokalemia: Secondary | ICD-10-CM

## 2018-06-13 DIAGNOSIS — T451X5A Adverse effect of antineoplastic and immunosuppressive drugs, initial encounter: Secondary | ICD-10-CM

## 2018-06-13 DIAGNOSIS — R634 Abnormal weight loss: Secondary | ICD-10-CM

## 2018-06-13 LAB — COMPREHENSIVE METABOLIC PANEL
ALT: 73 U/L — AB (ref 0–44)
AST: 65 U/L — AB (ref 15–41)
Albumin: 3.3 g/dL — ABNORMAL LOW (ref 3.5–5.0)
Alkaline Phosphatase: 392 U/L — ABNORMAL HIGH (ref 38–126)
Anion gap: 10 (ref 5–15)
BUN: 20 mg/dL (ref 6–20)
CHLORIDE: 105 mmol/L (ref 98–111)
CO2: 25 mmol/L (ref 22–32)
CREATININE: 1.36 mg/dL — AB (ref 0.44–1.00)
Calcium: 9.5 mg/dL (ref 8.9–10.3)
GFR calc non Af Amer: 43 mL/min — ABNORMAL LOW (ref >60)
GFR, EST AFRICAN AMERICAN: 50 mL/min — AB (ref >60)
Glucose, Bld: 116 mg/dL — ABNORMAL HIGH (ref 70–99)
Potassium: 3.7 mmol/L (ref 3.5–5.1)
SODIUM: 140 mmol/L (ref 135–145)
Total Bilirubin: 0.5 mg/dL (ref 0.3–1.2)
Total Protein: 7.9 g/dL (ref 6.5–8.1)

## 2018-06-13 LAB — CBC WITH DIFFERENTIAL/PLATELET
Abs Immature Granulocytes: 0.1 10*3/uL — ABNORMAL HIGH (ref 0.00–0.07)
Basophils Absolute: 0.1 10*3/uL (ref 0.0–0.1)
Basophils Relative: 1 %
EOS PCT: 3 %
Eosinophils Absolute: 0.3 10*3/uL (ref 0.0–0.5)
HCT: 24.3 % — ABNORMAL LOW (ref 36.0–46.0)
Hemoglobin: 7.9 g/dL — ABNORMAL LOW (ref 12.0–15.0)
Immature Granulocytes: 1 %
LYMPHS ABS: 0.7 10*3/uL (ref 0.7–4.0)
Lymphocytes Relative: 7 %
MCH: 34.5 pg — AB (ref 26.0–34.0)
MCHC: 32.5 g/dL (ref 30.0–36.0)
MCV: 106.1 fL — AB (ref 80.0–100.0)
Monocytes Absolute: 0.9 10*3/uL (ref 0.1–1.0)
Monocytes Relative: 9 %
NEUTROS PCT: 79 %
NRBC: 0 % (ref 0.0–0.2)
Neutro Abs: 7.6 10*3/uL (ref 1.7–7.7)
Platelets: 138 10*3/uL — ABNORMAL LOW (ref 150–400)
RBC: 2.29 MIL/uL — AB (ref 3.87–5.11)
RDW: 14.6 % (ref 11.5–15.5)
WBC: 9.6 10*3/uL (ref 4.0–10.5)

## 2018-06-13 LAB — SAMPLE TO BLOOD BANK

## 2018-06-13 LAB — PREPARE RBC (CROSSMATCH)

## 2018-06-13 MED ORDER — SODIUM CHLORIDE 0.9 % IV SOLN
60.0000 mg | Freq: Once | INTRAVENOUS | Status: AC
  Administered 2018-06-13: 60 mg via INTRAVENOUS
  Filled 2018-06-13: qty 14

## 2018-06-13 MED ORDER — SODIUM CHLORIDE 0.9 % IV SOLN
1800.0000 mg/m2 | INTRAVENOUS | Status: DC
  Administered 2018-06-13: 2650 mg via INTRAVENOUS
  Filled 2018-06-13: qty 53

## 2018-06-13 MED ORDER — LEUCOVORIN CALCIUM INJECTION 350 MG
400.0000 mg/m2 | Freq: Once | INTRAVENOUS | Status: AC
  Administered 2018-06-13: 592 mg via INTRAVENOUS
  Filled 2018-06-13: qty 29.6

## 2018-06-13 MED ORDER — PROCHLORPERAZINE MALEATE 10 MG PO TABS
10.0000 mg | ORAL_TABLET | Freq: Once | ORAL | Status: AC
  Administered 2018-06-13: 10 mg via ORAL

## 2018-06-13 MED ORDER — SODIUM CHLORIDE 0.9% FLUSH
10.0000 mL | Freq: Once | INTRAVENOUS | Status: AC
  Administered 2018-06-13: 10 mL
  Filled 2018-06-13: qty 10

## 2018-06-13 MED ORDER — AMOXICILLIN-POT CLAVULANATE 875-125 MG PO TABS: 1.0000 | ORAL_TABLET | Freq: Two times a day (BID) | ORAL | 0 refills | Status: DC

## 2018-06-13 MED ORDER — PROCHLORPERAZINE MALEATE 10 MG PO TABS
ORAL_TABLET | ORAL | Status: AC
  Filled 2018-06-13: qty 1

## 2018-06-13 MED ORDER — SODIUM CHLORIDE 0.9 % IV SOLN
INTRAVENOUS | Status: DC
  Administered 2018-06-13: 12:00:00 via INTRAVENOUS
  Filled 2018-06-13: qty 250

## 2018-06-13 MED ORDER — SODIUM CHLORIDE 0.9 % IV SOLN
Freq: Once | INTRAVENOUS | Status: AC
  Administered 2018-06-13: 12:00:00 via INTRAVENOUS
  Filled 2018-06-13: qty 250

## 2018-06-13 NOTE — Progress Notes (Signed)
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 Cholelithiasis.  No secondary signs of acute cholecystitis. 2. Multiple solid liver masses measuring up to 6.5 cm in the left lobe of the liver, evaluation for metastatic disease is recommended. These results will be called to the ordering clinician or representative by the Radiologist Assistant, and communication documented in the PACS or zVision Dashboard.    07/23/2017 Imaging    CT Abdomen W Contrast 07/23/17 IMPRESSION: 1. Widespread metastatic disease throughout the liver. No clear primary malignancy identified in the abdomen. The pelvis was not imaged. Tissue sampling recommended. 2. Probable adenopathy superior to the pancreatic tail. No evidence of pancreatic mass. 3. Suspected incidental hemangioma inferiorly in the right hepatic lobe. 4. Nonspecific nodularity in the breasts. The patient has undergone recent (03/25/2017 and 04/01/2017) mammography and ultrasound.    07/29/2017 Initial Diagnosis    Metastasis to liver of unknown origin (Morgan Dawson)    07/29/2017 PET scan    PET 07/29/17  IMPRESSION: 1. Numerous bulky liver masses are hypermetabolic compatible with malignancy. 2. Accentuated activity within or along the pancreatic tail, likely represent a primary pancreatic tumor. Consider pancreatic protocol MRI to further work  this up. 3. The peripancreatic lymph node shown above the pancreatic tail is mildly hypermetabolic favoring malignancy. 4.  Prominent stool throughout the colon favors constipation. 5. 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    08/05/2017 Pathology Results    Liver Biopsy  Diagnosis 08/05/17 Liver, needle/core biopsy - CARCINOMA. - SEE COMMENT. Microscopic Comment The malignant cells are positive for cytokeratin 7. They are negative for arginase, CDX2, cytokeratin 20, estrogen receptor, GATA-3, GCDFP, Glypican 3, Hep Par 1, Napsin A, and TTF-1. This immunohistochemical is nonspecific. Possibly primary sources include pancreatobiliary and upper gastrointestinal. Radiologic correlation is necessary. Of note, organ specific markers (GATA-3, GCDFP-breast, TTF-1, Napsin A-lung, and CDX2-colon) are negative. (JBK:ecj 08/09/2017)    08/21/2017 -  Chemotherapy    FOLFIRINOX every 2 weeks starting 08/21/17      10/14/2017 Imaging    CT CAP WO Contrast 10/14/17 IMPRESSION: Evidence of known numerous liver metastases without significant interval change. No other evidence of metastatic disease within the chest, abdomen or pelvis. Cholelithiasis. Tiny pericardial effusion.    12/02/2017 Genetic Testing    Negative for pathogenic mutation.  The genes analyzed were the 83 genes on [facility]'s Multi-Cancer panel (ALK, APC, ATM, AXIN2, BAP1, BARD1, BLM, BMPR1A, BRCA1, BRCA2, BRIP1, CASR, CDC73, CDH1, CDK4, CDKN1B, CDKN1C, CDKN2A, CEBPA, CHEK2, CTNNA1, DICER1, DIS3L2, EGFR, EPCAM, FH, FLCN, GATA2, GPC3, GREM1, HOXB13, HRAS, KIT, MAX, MEN1, MET, MITF, MLH1, MSH2, MSH3, MSH6, MUTYH, NBN, NF1, NF2, NTHL1, PALB2, PDGFRA, PHOX2B, PMS2, POLD1, POLE, POT1, PRKAR1A, PTCH1, PTEN, RAD50, RAD51C, RAD51D, RB1, RECQL4, RET, RUNX1, SDHA, SDHAF2, SDHB, SDHC, SDHD, SMAD4, SMARCA4, SMARCB1, SMARCE1, STK11, SUFU, TERC, TERT, TMEM127, TP53, TSC1, TSC2, VHL, WRN, WT1).    12/17/2017 PET scan    IMPRESSION:  1. Although the  liver metastases are still visible on the CT images, they are significantly smaller and retain no abnormal metabolic activity, consistent with response to therapy. 2. No abnormal activity within the pancreas. 3. No disease progression identified. 4. Cholelithiasis.    02/21/2018 PET scan    02/21/2018 PET Scan  IMPRESSION: 1. There are 2 new Dawson nodules identified within the right lung which measure up to 5 mm. These are too Dawson to reliably characterize by PET-CT, but warrant close interval follow-up. 2. Again noted are multifocal liver metastasis. The target lesion within the left lobe is slightly decreased in size when compared with the previous exam. Similar to previous exam there is no abnormal hypermetabolism above background liver activity identified within the liver lesions. 3. Gallstones.    02/22/2018 -  Chemotherapy    5-FU pump infusion and liposomal irinotecan, every 2 weeks.  First cycle dose reduced due to cytopenia in the travel.    05/17/2018 Imaging    05/17/2018 PET Scan IMPRESSION: 1. Mixed response. The 2 Dawson right lung nodules have decreased in size in the interval. Single focus of increased uptake within the liver is new from 02/21/2018 and concerning for recurrent metabolically active liver metastasis. 2. Splenomegaly.  New from previous exam. 3. Diffusely increased bone marrow activity, likely reflecting treatment related changes.     Pancreatic cancer metastasized to liver (Morgan Dawson)   08/11/2017 Initial Diagnosis    Pancreatic cancer metastasized to liver Morgan Dawson)      HISTORY OF PRESENTING ILLNESS: 07/30/17  Morgan Dawson 57 y.o. 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 The patient was referred by her PCP Dr. Maurice Dawson. The patient presents to the clinic today accompanied by husband.  Today the patient reports the weekend before Thanksgiving she had abdominal issues with bloating and cramps. These  symptoms worsened after she would eat. This persisted past Thanksgiving so she went to her PCP.  She presented to her PCP on 07/20/17 for upper abdominal pain, nausea and bloating. She had lab work up and RUQ [place] on 07/23/17. Results showed elevated LFTs (alk phos 227, AST 191, ALT 275) and US showed multiple masses in her liver and pancreatitis. She was put on a bland die due to her pancreatitis. CT AP in 07/23/17 showed diffuse metastatic disease in her liver with no clear primary. She had a PET scan that shows possible metastasis to peripancreatic lymph node along with her known liver masses.   Today she notes she has focused soreness in RUQ. She feels nauseous and has vomited 3 times total in the past 3 weeks. She has lost weight, initially purposefully. She has lost 5-10 pounds. She has been constipated  lately. She takes dulcolax to help. She denies dark stool or GI bleeding. No hematemesis. She does feel more fatigued. She was relatively active 4-5 days a week before last month. She has not needed medication as she feels she has more discomfort (1-2/10). She has not been exercising because she is afraid of making things worse. Her appetite is low from her discomfort and nausea. She notes multiple tender knots on her lower legs that appeared 5 nights ago. There is no itching. She notes she will follow up with Dr. Paulita Fujita this month for her pancreatitis.   Her last mammogram at the [facility] was 04/01/17 and mostly benign. She was recommended to repeat in 6 months. She notes she frequently has cyst in her breast  which were benign before.  Socially she is not working. She usually is active in the gym 3-4 days a week. She has 2 children (son and daughter) at 13yo and 76yo.   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 Her last episode of vertigo was a few months ago. She takes a diuretic and a low salt diet. She has jaw surgery for TMJ issues. She had endometrial ablation due to menorrhagia in  2011. She has only spotted since, no full period. Patient had a colonoscopy in 2014 with one benign polyp removed. Her father had colon cancer in his 51s. Her maternal grandfather had colon cancer in his 3s. Paternal aunt had lymphoma.   CURRENT THERAPY: second line 5-FU pump infusion and liposomal irinotecan every 2 weeks, started on 02/22/2018, dose reduction due to pancytopenia and neuropathy   INTERVAL HISTORY  Morgan Dawson is here for a follow up and ongoing treatment. She presents to the clinic today alone. She is doing well and she denies new complaints. Her energy level is good. She takes naps in the afternoon, but states that she has always been this way.   She states that she is going on a church event this weekend, but has arranged a hotel room to prevent mixing a lot with the crowd.   MEDICAL HISTORY:  Past Medical History:  Diagnosis Date  . Genetic testing 12/02/2017   Multi-Cancer panel (83 genes) @ [facility] - No pathogenic mutations detected  . Meniere disease 2016  . Pancreatitis     SURGICAL HISTORY: Past Surgical History:  Procedure Laterality Date  . CERVICAL ABLATION    . ENDOMETRIAL ABLATION  2011  . IR FLUORO GUIDE PORT INSERTION RIGHT  08/12/2017  . IR US GUIDE VASC ACCESS RIGHT  08/12/2017  . MANDIBLE SURGERY  1999    SOCIAL HISTORY: Social History   Socioeconomic History  . Marital status: Married    Spouse name: Not on file  . Number of children: Not on file  . Years of education: Not on file  . Highest education level: Not on file  Occupational History  . Not on file  Social Needs  . Financial resource strain: Not hard at all  . Food insecurity:    Worry: Never true    Inability: Never true  . Transportation needs:    Medical: No    Non-medical: No  Tobacco Use  . Smoking status: Never Smoker  . Smokeless tobacco: Never Used  Substance and Sexual Activity  . Alcohol use: No    Frequency: Never  . Drug use: No  . Sexual activity: Not  Currently  Lifestyle  . Physical activity:    Days per week: 4 days    Minutes per session: 30 min  . Stress: Not at all  Relationships  . Social connections:    Talks on phone: Patient refused    Gets together: Patient refused    Attends religious service: Patient refused    Active member of club or organization: Patient refused    Attends meetings of clubs or organizations: Patient refused    Relationship status: Patient refused  . Intimate partner violence:    Fear of current or ex partner: Patient refused    Emotionally abused: Patient refused    Physically abused: Patient refused    Forced sexual activity: Patient refused  Other Topics Concern  . Not on file  Social History Narrative  . Not on file    FAMILY HISTORY:  Family History  Problem Relation Age of Onset  . Colon cancer Father 70       currently 14  . Colon cancer Maternal Grandfather   . Breast cancer Cousin   . Thyroid cancer Mother 65       facial radiation for acne as teen  . Lymphoma Paternal Aunt 56       deceased 63  . Breast cancer Paternal Aunt     ALLERGIES:  has No Known Allergies.  MEDICATIONS:  Current Outpatient Medications  Medication Sig Dispense Refill  . gabapentin (NEURONTIN) 100 MG capsule Take 1 capsule (100 mg total) by mouth 3 (three) times daily. 90 capsule 2  . loperamide (IMODIUM) 2 MG capsule Take 1 capsule (2 mg total) by mouth as needed for diarrhea or loose stools. 30 capsule 0  . loratadine (CLARITIN) 10 MG tablet Take 10 mg by mouth daily as needed for allergies.    . magic mouthwash w/lidocaine SOLN Take 10 mLs by mouth 3 (three) times daily as needed for mouth pain. Swish and spit 10 ML by mouth 3 times daily as needed for mouth pain. 240 mL 0  . meclizine (ANTIVERT) 25 MG tablet Take 25 mg by mouth 3 (three) times daily as needed for dizziness.    . ondansetron (ZOFRAN-ODT) 8 MG disintegrating tablet TAKE 1 TABLET BY MOUTH EVERY 8 HOURS AS NEEDED FOR NAUSE OR VOMITING 40  tablet 0  . potassium chloride (K-DUR) 10 MEQ tablet Take 1 tablet (10 mEq total) by mouth 4 (four) times daily. 360 tablet 2  . prochlorperazine (COMPAZINE) 10 MG tablet Take 1 tablet (10 mg total) by mouth every 6 (six) hours as needed for nausea or vomiting. 40 tablet 2  . traMADol (ULTRAM) 50 MG tablet Take 1 tablet (50 mg total) by mouth every 6 (six) hours as needed. 30 tablet 0  . amoxicillin-clavulanate (AUGMENTIN) 875-125 MG tablet Take 1 tablet by mouth 2 (two) times daily. 10 tablet 0   No current facility-administered medications for this visit.     REVIEW OF SYSTEMS:  Constitutional: Denies fevers, chills or abnormal night sweats (+) better energy Eyes: Denies blurriness of vision, double vision or watery eyes Ears, nose, mouth, throat, and face: Denies mucositis or sore throat Respiratory: Denies cough, dyspnea or wheezes Cardiovascular: Denies palpitation, chest discomfort or lower extremity swelling Gastrointestinal:  No constipation, diarrhea, or new abdominal pain (+) mild nausea Skin: Denies abnormal skin rashes, tender knots down lower legs bilaterally Lymphatics: Denies new lymphadenopathy or easy bruising Neurological:Denies new weaknesses (+) numbness and tingling in all extremities. Behavioral/Psych: Mood is stable, no new changes  All other systems were reviewed with the patient and are negative.  PHYSICAL EXAMINATION:  ECOG PERFORMANCE STATUS: 1 Today's Vitals   06/13/18 1105 06/13/18 1106  BP: 102/68   Pulse: 84   Resp: 18   Temp: 98.4 F (36.9 C)   TempSrc: Oral   SpO2: 100%   Weight: 106 lb 4.8 oz (48.2 kg)   Height: 5' 5"  (1.651 m)   PainSc:  0-No pain     GENERAL:alert, no distress and comfortable (+) hair loss SKIN: skin color, texture, turgor are normal, no rashes or significant lesions EYES: normal, conjunctiva are pink and non-injected, sclera clear OROPHARYNX:no exudate, no erythema and lips, buccal mucosa, and tongue normal  NECK: supple,  thyroid normal size, non-tender, without nodularity LYMPH:  no palpable lymphadenopathy in the cervical, axillary or inguinal LUNGS: clear to auscultation and percussion with normal  breathing effort HEART: regular rate & rhythm and no murmurs and no lower extremity edema ABDOMEN:abdomen soft, normal bowel sounds. No hepatomegaly or tenderness on palpation (+) Dawson hernia in the left inguial area Musculoskeletal:no cyanosis of digits and no clubbing  PSYCH: alert & oriented x 3 with fluent speech NEURO: no focal motor deficit.   LABORATORY DATA:  I have reviewed the data as listed CBC Latest Ref Rng & Units 06/13/2018 05/30/2018 05/12/2018  WBC 4.0 - 10.5 K/uL 9.6 6.0 8.3  Hemoglobin 12.0 - 15.0 g/dL 7.9(L) 8.1(L) 9.0(L)  Hematocrit 36.0 - 46.0 % 24.3(L) 23.3(L) 26.0(L)  Platelets 150 - 400 K/uL 138(L) 123(L) 138(L)    CMP Latest Ref Rng & Units 06/13/2018 05/30/2018 05/12/2018  Glucose 70 - 99 mg/dL 116(H) 113(H) 113(H)  BUN 6 - 20 mg/dL 20 21(H) 18  Creatinine 0.44 - 1.00 mg/dL 1.36(H) 1.28(H) 1.32(H)  Sodium 135 - 145 mmol/L 140 140 141  Potassium 3.5 - 5.1 mmol/L 3.7 3.7 3.7  Chloride 98 - 111 mmol/L 105 104 104  CO2 22 - 32 mmol/L 25 27 27   Calcium 8.9 - 10.3 mg/dL 9.5 9.6 9.8  Total Protein 6.5 - 8.1 g/dL 7.9 7.6 7.8  Total Bilirubin 0.3 - 1.2 mg/dL 0.5 0.9 0.8  Alkaline Phos 38 - 126 U/L 392(H) 370(H) 350(H)  AST 15 - 41 U/L 65(H) 62(H) 60(H)  ALT 0 - 44 U/L 73(H) 76(H) 70(H)    Tumor Markers Results for Morgan Dawson, Morgan Dawson (MRN [id number]) as of 06/09/2018 18:31  Ref. Range 02/23/2018 08:21 03/28/2018 08:34 04/26/2018 08:55 05/30/2018 09:08  CA 19-9 Latest Ref Range: 0 - 35 U/mL 25 17 19 19     PATHOLOGY 11/22/2017 Molecular Pathology The following genes were evaluated for sequence changes and exonic deletions/duplications: ALK, APC, ATM, AXIN2, BAP1, BARD1, BLM, BMPR1A, BRCA1, BRCA2, BRIP1, CASR, CDC73, CDH1, CDK4, CDKN1B, CDKN1C, CDKN2A (p14ARF), CDKN2A (p16INK4a), CEBPA, CHEK2,  CTNNA1, DICER1, DIS3L2, EPCAM*, FH, FLCN, GATA2, GPC3, GREM1*, HRAS, KIT, MAX, MEN1, MET, MLH1, MSH2, MSH3, MSH6, MUTYH, NBN, NF1, NF2, PALB2, PDGFRA, PHOX2B*, PMS2, POLD1, POLE, POT1, PRKAR1A, PTCH1, PTEN, RAD50, RAD51C, RAD51D, RB1, RECQL4, RET, RUNX1, SDHAF2, SDHB, SDHC, SDHD, SMAD4, SMARCA4, SMARCB1, SMARCE1, STK11, SUFU, TERC, TERT, TMEM127, TP53, TSC1, TSC2, VHL, WRN*, WT1 The following genes were evaluated for sequence changes only: EGFR*, HOXB13*, MITF*, NTHL1*, SDHA Results are negative unless otherwise indicated    Liver Biopsy  Diagnosis 08/05/17 Liver, needle/core biopsy - CARCINOMA. - SEE COMMENT. Microscopic Comment The malignant cells are positive for cytokeratin 7. They are negative for arginase, CDX2, cytokeratin 20, estrogen receptor, GATA-3, GCDFP, Glypican 3, Hep Par 1, Napsin A, and TTF-1. This immunohistochemical is nonspecific. Possibly primary sources include pancreatobiliary and upper gastrointestinal. Radiologic correlation is necessary. Of note, organ specific markers (GATA-3, GCDFP-breast, TTF-1, Napsin A-lung, and CDX2-colon) are negative. (JBK:ecj 08/09/2017)   Diagnosis 03/07/13 Surgical, cecum, polyp -TUBULAR ADENOMA -NEGATIVE FOR HIGH GRADE DYSPLASIA   PROCEDURES  Colonoscopy by Dr. Paulita Fujita 03/07/13 IMPRESSION:  -One 5 m polyp in the cecum. Resected and retrieved -The distal rectum and anal verge are normal on retroflexion view.  -The examination was otherwise normal.    RADIOGRAPHIC STUDIES: I have personally reviewed the radiological images as listed and agreed with the findings in the report.  05/17/2018 PET Scan IMPRESSION: 1. Mixed response. The 2 Dawson right lung nodules have decreased in size in the interval. Single focus of increased uptake within the liver is new from 02/21/2018 and concerning for recurrent metabolically active liver metastasis.  2. Splenomegaly.  New from previous exam. 3. Diffusely increased bone marrow activity,  likely reflecting treatment related changes.  02/21/2018 PET Scan  IMPRESSION: 1. There are 2 new Dawson nodules identified within the right lung which measure up to 5 mm. These are too Dawson to reliably characterize by PET-CT, but warrant close interval follow-up. 2. Again noted are multifocal liver metastasis. The target lesion within the left lobe is slightly decreased in size when compared with the previous exam. Similar to previous exam there is no abnormal hypermetabolism above background liver activity identified within the liver lesions. 3. Gallstones.  PET Scan 12/17/17 IMPRESSION: 1. Although the liver metastases are still visible on the CT images, they are significantly smaller and retain no abnormal metabolic activity, consistent with response to therapy. 2. No abnormal activity within the pancreas. 3. No disease progression identified. 4. Cholelithiasis.  CT CAP WO Contrast 10/14/17 IMPRESSION: Evidence of known numerous liver metastases without significant interval change. No other evidence of metastatic disease within the chest, abdomen or pelvis. Cholelithiasis. Tiny pericardial effusion.   ASSESSMENT & PLAN:  TRINITY HAUN is a 57 y.o. caucasian female with a history of vertigo and menieres disease, presented with epigastric discomfort, low appetite and 5 pound weight loss   1. 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  07/29/17 PET scan shows diffuse metastatic disease in her enlarged liver.  This is most consistent with diffuse liver metastasis.  -Her liver biopsy confirmed metastatic adenocarcinoma, most consistent with biliary or pancreatic primary this was discussed with patient.  -After further reviewing her 07/29/17 PET in the GI Tumor Board, the initial uptake in the duodenum is felt to be in the pancrease tail, which  represents the primary tumor.  The consensus from GI tumor board is this is most consistent with metastatic pancreatic adenocarcinoma to liver.  We felt she does not need further imaging or EUS to confirm the primary site.  -Given her metastatic stage IV disease, her metastatic cancer is unfortunately not curable, and the goal of therapy is palliative, to prolong her life and improve her quality of life.  -Goal of therapy is to control the disease.  -she started first line FOLFIRINOX on 08/21/17.  -She has been tolerating chemotherapy moderately well, with some nausea, cold sensitivity, diarrhea, and fatigue, she has developed slightly worsening renal function, has received IV fluids intermittently  -CT CAP WO Contrast from 10/14/17 reveals stable disease, liver metastases without significant interval change. No other evidence of metastatic disease within the chest, abdomen or pelvis..  -Her CA 19.9 continues to decrease, corresponding to her good response to chemotherapy. -her PET scan from 12/17/17 revealed that her liver mets are significantly smaller and retain no abnormal metabolic activity. No new lesions identified. I discussed these results with the pt and her husband. They are very pleased.  -She has developed persistent peripheral neuropathy, grade 2, I stopped her oxaliplatin on 02/10/2018 -I reviewed her restaging PET scan from February 21, 2018, which showed a few new Dawson lung nodules up to 5 mm, not impressive.  No hypermetabolic lesions in the liver or anywhere else. She has had excellent response to chemo so far -her treatment was switched to 5-FU pump infusion and liposomal irinotecan, every 2 weeks.  Due to her cytopenia, I reduced the dose, she has been tolerating very well. ---PET scan from 05/17/2018 revealed overall  stable disease, a few Dawson lung nodules have decreased in size, 1 of the liver lesions is slightly more hypermetabolic, although size has decreased slightly.  No other new  lesions. I recommend her to continue current regiment, with close follow-up scan in 2 months. I discussed with the patient and she agrees with the plan.  -Lab reviewed, CBC showed Hg 7.9 platelets 138K WBC 9.6K. CMP showed Cr 1.36  AST 65 and ALT 73. adequate for treatment.  She is not very somatic from her anemia, due to the upcoming trip, I will give her 1 unit of red blood cell in 2 days. -I prescribed prophylactic antibiotics for her coming trip to beach. She knows to avoid large crowds and measure her temperature if not feeling well. I advised her to call if any warming symptoms occur.  -f/u in 2 weeks, plan to repeat scan before her treatment on 12/2    2. Anorexia, weight loss -She has anxiety about eating due to emesis.  -I previously encouraged her to eat more Dawson meals with bland or liquid diet. She can increase her intake of ensure boost -Follow-up with dietitian -weight stable lately   3. Hypercalcemia -Secondary to her underlying malignancy -Received Zometa and IV hydration in December 2018, hypercalcemia has resolved. -She restarted dairy products. -She had cramps, her Ca was good to restart calcium and magnesium.  4. Transaminitis -Due to her underlying liver metastasis and chemo  -Improved since she started chemo, slightly worse again lately  -Monitoring  5. 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 Hypokalemia, CKD stage III -She takes 10-40 mEq on a given day depending on her ability to swallow pills. K was 3.0 10/04/17. She does not have heavy GI output. I recommended she take 20 mEq daily consistently.  -Creatinine continues to increase; she appears adequately  hydrated. Could be related to chemotherapy. Will check mag to make sure it is normal. Will obtain uric acid to r/o tumor lysis.  -will check labs closely, add on with pump d/c on day 3 -I stopped her spironolactone on 11/01/17 due to high Cr -US renal from 11/08/17 revealed hyperechoic renal cortex bilaterally without hydronephrosis -continue KCL supplement, she takes 40 mEq daily  7. Genetics -She previously met with genetic counselor and test was not recommended  -I informed pt today that testing will be beneficial to know if she will be a candidate for other drugs in the future if she is tested positive for Lynch Syndrome or BRCA1/BRCA2 mutation carrier  -Her genetic testing (83 cancer related genes) was negative  8.  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 -Her neuropathy is getting worse and is bothering her. I previously prescribed Gabapentin for her and monitor. She will start at 100 mg at bedtime, and gradually increase dose to 300, and she will use during the day if needed and if she tolerates.   9. Goal of care discussion  -We previously discussed the incurable nature of her cancer, and the overall poor prognosis, especially if she does not have good  response to chemotherapy or progress on chemo -The patient understands the goal of care is palliative. -I previously recommended DNR/DNI, she will think about it    PLAN: -Lab reviewed, adequate for treatment, will proceed chemo today and continue every 2 weeks with neulasta on day 3 -We will give 1 unit RBC in 2 days after pump DC  -f/u in 2 weeks -I prescribes prophylactic Augmentin for the upcoming trip    All questions were answered. The patient knows to call the clinic with any problems, questions or concerns. I spent 20 minutes counseling the  patient face to face. The total time spent in the appointment was 25 minutes and more than 50% was on counseling.  I have reviewed the above documentation for accuracy and completeness, and I agree with the above.  Dierdre Searles Dweik am acting as scribe for Dr. Truitt Dawson.  I have reviewed the above documentation for accuracy and completeness, and I agree with the above.     Morgan Merle, MD 06/13/2018

## 2018-06-13 NOTE — Patient Instructions (Signed)
[facility] Discharge Instructions for Patients Receiving Chemotherapy  Today you received the following chemotherapy agents Irinotecan, Leucovorin & Fluorouracil (Adrucil).  To help prevent nausea and vomiting after your treatment, we encourage you to take your nausea medication as prescribed.   If you develop nausea and vomiting that is not controlled by your nausea medication, call the clinic.   BELOW ARE SYMPTOMS THAT SHOULD BE REPORTED IMMEDIATELY:  *FEVER GREATER THAN 100.5 F  *CHILLS WITH OR WITHOUT FEVER  NAUSEA AND VOMITING THAT IS NOT CONTROLLED WITH YOUR NAUSEA MEDICATION  *UNUSUAL SHORTNESS OF BREATH  *UNUSUAL BRUISING OR BLEEDING  TENDERNESS IN MOUTH AND THROAT WITH OR WITHOUT PRESENCE OF ULCERS  *URINARY PROBLEMS  *BOWEL PROBLEMS  UNUSUAL RASH Items with * indicate a potential emergency and should be followed up as soon as possible.  Feel free to call the clinic should you have any questions or concerns. The clinic phone number is [phone].  Please show the [facility] at check-in to the Emergency Department and triage nurse.

## 2018-06-13 NOTE — Progress Notes (Signed)
Per Dr. Burr Medico, okay to treat patient with current labs.

## 2018-06-14 ENCOUNTER — Telehealth: Payer: Self-pay | Admitting: *Deleted

## 2018-06-14 ENCOUNTER — Telehealth: Payer: Self-pay | Admitting: Hematology

## 2018-06-14 NOTE — Telephone Encounter (Signed)
[facility] called needing clarification as to what potassium Dr Burr Medico wants the patient to be taking. States they received 2 different prescriptions.  Please call at [phone]  And use reference # [id number].

## 2018-06-14 NOTE — Telephone Encounter (Signed)
She is taking KCL 29meq 4 tabs/day (split to 2-4 times a day), please call back and clarify, thanks   [name] MD

## 2018-06-14 NOTE — Telephone Encounter (Signed)
Called pharmacy to clarify that patient is taking KCL 57meq 4 tablets per day. (can split 2-4 times per day)

## 2018-06-14 NOTE — Telephone Encounter (Signed)
No los 10/21

## 2018-06-15 ENCOUNTER — Encounter (HOSPITAL_COMMUNITY): Payer: Self-pay | Admitting: Hematology

## 2018-06-15 ENCOUNTER — Inpatient Hospital Stay: Payer: 59

## 2018-06-15 VITALS — BP 100/72 | HR 87 | Temp 98.8°F | Resp 16

## 2018-06-15 DIAGNOSIS — C787 Secondary malignant neoplasm of liver and intrahepatic bile duct: Secondary | ICD-10-CM

## 2018-06-15 DIAGNOSIS — C251 Malignant neoplasm of body of pancreas: Secondary | ICD-10-CM

## 2018-06-15 DIAGNOSIS — D696 Thrombocytopenia, unspecified: Secondary | ICD-10-CM

## 2018-06-15 DIAGNOSIS — C259 Malignant neoplasm of pancreas, unspecified: Secondary | ICD-10-CM

## 2018-06-15 DIAGNOSIS — Z5111 Encounter for antineoplastic chemotherapy: Secondary | ICD-10-CM | POA: Diagnosis not present

## 2018-06-15 DIAGNOSIS — Z7189 Other specified counseling: Secondary | ICD-10-CM

## 2018-06-15 MED ORDER — SODIUM CHLORIDE 0.9% FLUSH
3.0000 mL | INTRAVENOUS | Status: DC | PRN
  Filled 2018-06-15: qty 10

## 2018-06-15 MED ORDER — SODIUM CHLORIDE 0.9% FLUSH
10.0000 mL | INTRAVENOUS | Status: AC | PRN
  Administered 2018-06-15: 10 mL
  Filled 2018-06-15: qty 10

## 2018-06-15 MED ORDER — DIPHENHYDRAMINE HCL 25 MG PO CAPS
25.0000 mg | ORAL_CAPSULE | Freq: Once | ORAL | Status: AC
  Administered 2018-06-15: 25 mg via ORAL

## 2018-06-15 MED ORDER — PEGFILGRASTIM INJECTION 6 MG/0.6ML ~~LOC~~
6.0000 mg | PREFILLED_SYRINGE | Freq: Once | SUBCUTANEOUS | Status: AC
  Administered 2018-06-15: 6 mg via SUBCUTANEOUS

## 2018-06-15 MED ORDER — PEGFILGRASTIM INJECTION 6 MG/0.6ML ~~LOC~~
PREFILLED_SYRINGE | SUBCUTANEOUS | Status: AC
  Filled 2018-06-15: qty 0.6

## 2018-06-15 MED ORDER — ACETAMINOPHEN 325 MG PO TABS
ORAL_TABLET | ORAL | Status: AC
  Filled 2018-06-15: qty 2

## 2018-06-15 MED ORDER — ACETAMINOPHEN 325 MG PO TABS
650.0000 mg | ORAL_TABLET | Freq: Once | ORAL | Status: AC
  Administered 2018-06-15: 650 mg via ORAL

## 2018-06-15 MED ORDER — SODIUM CHLORIDE 0.9% IV SOLUTION
250.0000 mL | Freq: Once | INTRAVENOUS | Status: AC
  Administered 2018-06-15: 250 mL via INTRAVENOUS
  Filled 2018-06-15: qty 250

## 2018-06-15 MED ORDER — HEPARIN SOD (PORK) LOCK FLUSH 100 UNIT/ML IV SOLN
500.0000 [IU] | Freq: Every day | INTRAVENOUS | Status: AC | PRN
  Administered 2018-06-15: 500 [IU]
  Filled 2018-06-15: qty 5

## 2018-06-15 MED ORDER — DIPHENHYDRAMINE HCL 25 MG PO CAPS
ORAL_CAPSULE | ORAL | Status: AC
  Filled 2018-06-15: qty 1

## 2018-06-15 NOTE — Patient Instructions (Signed)

## 2018-06-16 LAB — TYPE AND SCREEN
ABO/RH(D): A POS
ANTIBODY SCREEN: NEGATIVE
UNIT DIVISION: 0

## 2018-06-16 LAB — BPAM RBC
BLOOD PRODUCT EXPIRATION DATE: 201911182359
ISSUE DATE / TIME: 201910231431
UNIT TYPE AND RH: 6200

## 2018-06-20 ENCOUNTER — Telehealth: Payer: Self-pay

## 2018-06-20 NOTE — Telephone Encounter (Signed)
Morgan Dawson called to say that she has multiple raised areas "bumps" that appeared 06/16/18. They do not itch and there is no draining. They are tender to touch and the bumps on the underside of her arm hurt when she rests her arm on the table. Information given to Valda Favia RN who will follow up with patient after speaking with Dr. Burr Medico.

## 2018-06-20 NOTE — Telephone Encounter (Signed)
Spoke with patient per Dr. Burr Medico regarding bumps, probably a drug reaction or reaction to blood transfusion, instructed patient to take Benadryl and use Hydrocortisone cream to effected areas, call if worsens, spreads, becomes itchy, patient verbalized an understanding.

## 2018-06-21 ENCOUNTER — Other Ambulatory Visit: Payer: Self-pay | Admitting: Hematology

## 2018-06-21 DIAGNOSIS — E876 Hypokalemia: Secondary | ICD-10-CM

## 2018-06-27 ENCOUNTER — Encounter: Payer: Self-pay | Admitting: Hematology

## 2018-06-27 ENCOUNTER — Inpatient Hospital Stay (HOSPITAL_BASED_OUTPATIENT_CLINIC_OR_DEPARTMENT_OTHER): Payer: 59 | Admitting: Hematology

## 2018-06-27 ENCOUNTER — Telehealth: Payer: Self-pay | Admitting: Hematology

## 2018-06-27 ENCOUNTER — Inpatient Hospital Stay: Payer: 59

## 2018-06-27 ENCOUNTER — Inpatient Hospital Stay: Payer: 59 | Attending: Hematology

## 2018-06-27 VITALS — BP 101/73 | HR 83 | Temp 98.4°F | Resp 16

## 2018-06-27 DIAGNOSIS — C251 Malignant neoplasm of body of pancreas: Secondary | ICD-10-CM

## 2018-06-27 DIAGNOSIS — Z5111 Encounter for antineoplastic chemotherapy: Secondary | ICD-10-CM | POA: Insufficient documentation

## 2018-06-27 DIAGNOSIS — N183 Chronic kidney disease, stage 3 (moderate): Secondary | ICD-10-CM | POA: Diagnosis not present

## 2018-06-27 DIAGNOSIS — C787 Secondary malignant neoplasm of liver and intrahepatic bile duct: Secondary | ICD-10-CM | POA: Diagnosis not present

## 2018-06-27 DIAGNOSIS — J069 Acute upper respiratory infection, unspecified: Secondary | ICD-10-CM | POA: Insufficient documentation

## 2018-06-27 DIAGNOSIS — T451X5A Adverse effect of antineoplastic and immunosuppressive drugs, initial encounter: Secondary | ICD-10-CM

## 2018-06-27 DIAGNOSIS — R05 Cough: Secondary | ICD-10-CM | POA: Diagnosis not present

## 2018-06-27 DIAGNOSIS — H8109 Meniere's disease, unspecified ear: Secondary | ICD-10-CM

## 2018-06-27 DIAGNOSIS — C259 Malignant neoplasm of pancreas, unspecified: Secondary | ICD-10-CM

## 2018-06-27 DIAGNOSIS — Z7189 Other specified counseling: Secondary | ICD-10-CM

## 2018-06-27 DIAGNOSIS — G620 Drug-induced polyneuropathy: Secondary | ICD-10-CM

## 2018-06-27 DIAGNOSIS — Z5189 Encounter for other specified aftercare: Secondary | ICD-10-CM | POA: Diagnosis not present

## 2018-06-27 DIAGNOSIS — D61810 Antineoplastic chemotherapy induced pancytopenia: Secondary | ICD-10-CM | POA: Diagnosis not present

## 2018-06-27 DIAGNOSIS — D696 Thrombocytopenia, unspecified: Secondary | ICD-10-CM

## 2018-06-27 DIAGNOSIS — E876 Hypokalemia: Secondary | ICD-10-CM

## 2018-06-27 DIAGNOSIS — R634 Abnormal weight loss: Secondary | ICD-10-CM

## 2018-06-27 DIAGNOSIS — R740 Nonspecific elevation of levels of transaminase and lactic acid dehydrogenase [LDH]: Secondary | ICD-10-CM

## 2018-06-27 DIAGNOSIS — R630 Anorexia: Secondary | ICD-10-CM

## 2018-06-27 LAB — CBC WITH DIFFERENTIAL/PLATELET
ABS IMMATURE GRANULOCYTES: 0.23 10*3/uL — AB (ref 0.00–0.07)
BASOS ABS: 0.1 10*3/uL (ref 0.0–0.1)
BASOS PCT: 0 %
Eosinophils Absolute: 0.3 10*3/uL (ref 0.0–0.5)
Eosinophils Relative: 2 %
HCT: 27.7 % — ABNORMAL LOW (ref 36.0–46.0)
Hemoglobin: 9 g/dL — ABNORMAL LOW (ref 12.0–15.0)
IMMATURE GRANULOCYTES: 2 %
LYMPHS ABS: 0.7 10*3/uL (ref 0.7–4.0)
LYMPHS PCT: 5 %
MCH: 33.6 pg (ref 26.0–34.0)
MCHC: 32.5 g/dL (ref 30.0–36.0)
MCV: 103.4 fL — ABNORMAL HIGH (ref 80.0–100.0)
Monocytes Absolute: 1 10*3/uL (ref 0.1–1.0)
Monocytes Relative: 7 %
NEUTROS ABS: 11.1 10*3/uL — AB (ref 1.7–7.7)
NRBC: 0 % (ref 0.0–0.2)
Neutrophils Relative %: 84 %
PLATELETS: 143 10*3/uL — AB (ref 150–400)
RBC: 2.68 MIL/uL — ABNORMAL LOW (ref 3.87–5.11)
RDW: 15.1 % (ref 11.5–15.5)
WBC: 13.3 10*3/uL — ABNORMAL HIGH (ref 4.0–10.5)

## 2018-06-27 LAB — COMPREHENSIVE METABOLIC PANEL
ALT: 91 U/L — AB (ref 0–44)
AST: 72 U/L — AB (ref 15–41)
Albumin: 3.2 g/dL — ABNORMAL LOW (ref 3.5–5.0)
Alkaline Phosphatase: 419 U/L — ABNORMAL HIGH (ref 38–126)
Anion gap: 9 (ref 5–15)
BUN: 18 mg/dL (ref 6–20)
CHLORIDE: 104 mmol/L (ref 98–111)
CO2: 27 mmol/L (ref 22–32)
CREATININE: 1.32 mg/dL — AB (ref 0.44–1.00)
Calcium: 9.8 mg/dL (ref 8.9–10.3)
GFR calc non Af Amer: 44 mL/min — ABNORMAL LOW (ref >60)
GFR, EST AFRICAN AMERICAN: 52 mL/min — AB (ref >60)
Glucose, Bld: 111 mg/dL — ABNORMAL HIGH (ref 70–99)
Potassium: 3.9 mmol/L (ref 3.5–5.1)
SODIUM: 140 mmol/L (ref 135–145)
Total Bilirubin: 0.5 mg/dL (ref 0.3–1.2)
Total Protein: 8 g/dL (ref 6.5–8.1)

## 2018-06-27 MED ORDER — SODIUM CHLORIDE 0.9 % IV SOLN
Freq: Once | INTRAVENOUS | Status: AC
  Administered 2018-06-27: 11:00:00 via INTRAVENOUS
  Filled 2018-06-27: qty 250

## 2018-06-27 MED ORDER — PROCHLORPERAZINE MALEATE 10 MG PO TABS
10.0000 mg | ORAL_TABLET | Freq: Once | ORAL | Status: AC
  Administered 2018-06-27: 10 mg via ORAL

## 2018-06-27 MED ORDER — SODIUM CHLORIDE 0.9 % IV SOLN
1800.0000 mg/m2 | INTRAVENOUS | Status: DC
  Administered 2018-06-27: 2650 mg via INTRAVENOUS
  Filled 2018-06-27: qty 53

## 2018-06-27 MED ORDER — SODIUM CHLORIDE 0.9 % IV SOLN
60.0000 mg | Freq: Once | INTRAVENOUS | Status: AC
  Administered 2018-06-27: 60 mg via INTRAVENOUS
  Filled 2018-06-27: qty 13.95

## 2018-06-27 MED ORDER — HEPARIN SOD (PORK) LOCK FLUSH 100 UNIT/ML IV SOLN
500.0000 [IU] | Freq: Once | INTRAVENOUS | Status: DC | PRN
  Filled 2018-06-27: qty 5

## 2018-06-27 MED ORDER — PROCHLORPERAZINE MALEATE 10 MG PO TABS
ORAL_TABLET | ORAL | Status: AC
  Filled 2018-06-27: qty 1

## 2018-06-27 MED ORDER — LEUCOVORIN CALCIUM INJECTION 350 MG
400.0000 mg/m2 | Freq: Once | INTRAVENOUS | Status: AC
  Administered 2018-06-27: 592 mg via INTRAVENOUS
  Filled 2018-06-27: qty 29.6

## 2018-06-27 MED ORDER — SODIUM CHLORIDE 0.9% FLUSH
10.0000 mL | INTRAVENOUS | Status: DC | PRN
  Filled 2018-06-27: qty 10

## 2018-06-27 NOTE — Progress Notes (Signed)
Per Dr. Burr Medico, okay to treat patient with current labs.

## 2018-06-27 NOTE — Progress Notes (Signed)
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 Cholelithiasis.  No secondary signs of acute cholecystitis. 2. Multiple solid liver masses measuring up to 6.5 cm in the left lobe of the liver, evaluation for metastatic disease is recommended. These results will be called to the ordering clinician or representative by the Radiologist Assistant, and communication documented in the PACS or zVision Dashboard.    07/23/2017 Imaging    CT Abdomen W Contrast 07/23/17 IMPRESSION: 1. Widespread metastatic disease throughout the liver. No clear primary malignancy identified in the abdomen. The pelvis was not imaged. Tissue sampling recommended. 2. Probable adenopathy superior to the pancreatic tail. No evidence of pancreatic mass. 3. Suspected incidental hemangioma inferiorly in the right hepatic lobe. 4. Nonspecific nodularity in the breasts. The patient has undergone recent (03/25/2017 and 04/01/2017) mammography and ultrasound.    07/29/2017 Initial Diagnosis    Metastasis to liver of unknown origin ([place])    07/29/2017 PET scan    PET 07/29/17  IMPRESSION: 1. Numerous bulky liver masses are hypermetabolic compatible with malignancy. 2. Accentuated activity within or along the pancreatic tail, likely represent a primary pancreatic tumor. Consider pancreatic protocol MRI to further work  this up. 3. The peripancreatic lymph node shown above the pancreatic tail is mildly hypermetabolic favoring malignancy. 4.  Prominent stool throughout the colon favors constipation. 5. 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    08/05/2017 Pathology Results    Liver Biopsy  Diagnosis 08/05/17 Liver, needle/core biopsy - CARCINOMA. - SEE COMMENT. Microscopic Comment The malignant cells are positive for cytokeratin 7. They are negative for arginase, CDX2, cytokeratin 20, estrogen receptor, GATA-3, GCDFP, Glypican 3, Hep Par 1, Napsin A, and TTF-1. This immunohistochemical is nonspecific. Possibly primary sources include pancreatobiliary and upper gastrointestinal. Radiologic correlation is necessary. Of note, organ specific markers (GATA-3, GCDFP-breast, TTF-1, Napsin A-lung, and CDX2-colon) are negative. (JBK:ecj 08/09/2017)    08/21/2017 -  Chemotherapy    FOLFIRINOX every 2 weeks starting 08/21/17      10/14/2017 Imaging    CT CAP WO Contrast 10/14/17 IMPRESSION: Evidence of known numerous liver metastases without significant interval change. No other evidence of metastatic disease within the chest, abdomen or pelvis. Cholelithiasis. Tiny pericardial effusion.    12/02/2017 Genetic Testing    Negative for pathogenic mutation.  The genes analyzed were the 83 genes on [facility]'s Multi-Cancer panel (ALK, APC, ATM, AXIN2, BAP1, BARD1, BLM, BMPR1A, BRCA1, BRCA2, BRIP1, CASR, CDC73, CDH1, CDK4, CDKN1B, CDKN1C, CDKN2A, CEBPA, CHEK2, CTNNA1, DICER1, DIS3L2, EGFR, EPCAM, FH, FLCN, GATA2, GPC3, GREM1, HOXB13, HRAS, KIT, MAX, MEN1, MET, MITF, MLH1, MSH2, MSH3, MSH6, MUTYH, NBN, NF1, NF2, NTHL1, PALB2, PDGFRA, PHOX2B, PMS2, POLD1, POLE, POT1, PRKAR1A, PTCH1, PTEN, RAD50, RAD51C, RAD51D, RB1, RECQL4, RET, RUNX1, SDHA, SDHAF2, SDHB, SDHC, SDHD, SMAD4, SMARCA4, SMARCB1, SMARCE1, STK11, SUFU, TERC, TERT, TMEM127, TP53, TSC1, TSC2, VHL, WRN, WT1).    12/17/2017 PET scan    IMPRESSION:  1. Although the  liver metastases are still visible on the CT images, they are significantly smaller and retain no abnormal metabolic activity, consistent with response to therapy. 2. No abnormal activity within the pancreas. 3. No disease progression identified. 4. Cholelithiasis.    02/21/2018 PET scan    02/21/2018 PET Scan  IMPRESSION: 1. There are 2 new small nodules identified within the right lung which measure up to 5 mm. These are too small to reliably characterize by PET-CT, but warrant close interval follow-up. 2. Again noted are multifocal liver metastasis. The target lesion within the left lobe is slightly decreased in size when compared with the previous exam. Similar to previous exam there is no abnormal hypermetabolism above background liver activity identified within the liver lesions. 3. Gallstones.    02/22/2018 -  Chemotherapy    5-FU pump infusion and liposomal irinotecan, every 2 weeks.  First cycle dose reduced due to cytopenia in the travel.    05/17/2018 Imaging    05/17/2018 PET Scan IMPRESSION: 1. Mixed response. The 2 small right lung nodules have decreased in size in the interval. Single focus of increased uptake within the liver is new from 02/21/2018 and concerning for recurrent metabolically active liver metastasis. 2. Splenomegaly.  New from previous exam. 3. Diffusely increased bone marrow activity, likely reflecting treatment related changes.     Pancreatic cancer metastasized to liver ([place])   08/11/2017 Initial Diagnosis    Pancreatic cancer metastasized to liver [facility])      HISTORY OF PRESENTING ILLNESS: 07/30/17  Morgan Dawson 57 y.o. 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 The patient was referred by her PCP Dr. [name]. The patient presents to the clinic today accompanied by husband.  Today the patient reports the weekend before Thanksgiving she had abdominal issues with bloating and cramps. These  symptoms worsened after she would eat. This persisted past Thanksgiving so she went to her PCP.  She presented to her PCP on 07/20/17 for upper abdominal pain, nausea and bloating. She had lab work up and RUQ [place] on 07/23/17. Results showed elevated LFTs (alk phos 227, AST 191, ALT 275) and US showed multiple masses in her liver and pancreatitis. She was put on a bland die due to her pancreatitis. CT AP in 07/23/17 showed diffuse metastatic disease in her liver with no clear primary. She had a PET scan that shows possible metastasis to peripancreatic lymph node along with her known liver masses.   Today she notes she has focused soreness in RUQ. She feels nauseous and has vomited 3 times total in the past 3 weeks. She has lost weight, initially purposefully. She has lost 5-10 pounds. She has been constipated  lately. She takes dulcolax to help. She denies dark stool or GI bleeding. No hematemesis. She does feel more fatigued. She was relatively active 4-5 days a week before last month. She has not needed medication as she feels she has more discomfort (1-2/10). She has not been exercising because she is afraid of making things worse. Her appetite is low from her discomfort and nausea. She notes multiple tender knots on her lower legs that appeared 5 nights ago. There is no itching. She notes she will follow up with Dr. Paulita Fujita this month for her pancreatitis.   Her last mammogram at the [facility] was 04/01/17 and mostly benign. She was recommended to repeat in 6 months. She notes she frequently has cyst in her breast  which were benign before.  Socially she is not working. She usually is active in the gym 3-4 days a week. She has 2 children (son and daughter) at 18yo and 50yo.   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 Her last episode of vertigo was a few months ago. She takes a diuretic and a low salt diet. She has jaw surgery for TMJ issues. She had endometrial ablation due to menorrhagia in  2011. She has only spotted since, no full period. Patient had a colonoscopy in 2014 with one benign polyp removed. Her father had colon cancer in his 73s. Her maternal grandfather had colon cancer in his 64s. Paternal aunt had lymphoma.   CURRENT THERAPY: second line 5-FU pump infusion and liposomal irinotecan every 2 weeks, started on 02/22/2018, dose reduction due to pancytopenia and neuropathy   INTERVAL HISTORY  Morgan Dawson is here for a follow up and ongoing treatment. She presents to the clinic today with her husband. She enjoyed her trip and is happy. She didn't require antibiotics. She is having another trip to [place] soon.   MEDICAL HISTORY:  Past Medical History:  Diagnosis Date  . Genetic testing 12/02/2017   Multi-Cancer panel (83 genes) @ [facility] - No pathogenic mutations detected  . Meniere disease 2016  . Pancreatitis     SURGICAL HISTORY: Past Surgical History:  Procedure Laterality Date  . CERVICAL ABLATION    . ENDOMETRIAL ABLATION  2011  . IR FLUORO GUIDE PORT INSERTION RIGHT  08/12/2017  . IR US GUIDE VASC ACCESS RIGHT  08/12/2017  . MANDIBLE SURGERY  1999    SOCIAL HISTORY: Social History   Socioeconomic History  . Marital status: Married    Spouse name: Not on file  . Number of children: Not on file  . Years of education: Not on file  . Highest education level: Not on file  Occupational History  . Not on file  Social Needs  . Financial resource strain: Not hard at all  . Food insecurity:    Worry: Never true    Inability: Never true  . Transportation needs:    Medical: No    Non-medical: No  Tobacco Use  . Smoking status: Never Smoker  . Smokeless tobacco: Never Used  Substance and Sexual Activity  . Alcohol use: No    Frequency: Never  . Drug use: No  . Sexual activity: Not Currently  Lifestyle  . Physical activity:    Days per week: 4 days    Minutes per session: 30 min  . Stress: Not at all  Relationships  . Social connections:      Talks on phone: Patient refused    Gets together: Patient refused    Attends religious service: Patient refused    Active member of club or organization: Patient refused    Attends meetings of clubs or organizations: Patient refused    Relationship status: Patient refused  . Intimate partner violence:    Fear of current or ex partner: Patient refused    Emotionally abused: Patient refused    Physically abused: Patient refused    Forced sexual activity: Patient refused  Other Topics Concern  . Not on file  Social History Narrative  . Not on file    FAMILY HISTORY: Family History  Problem Relation Age of Onset  . Colon cancer Father 21       currently 1  . Colon cancer Maternal Grandfather   . Breast cancer Cousin   .  Thyroid cancer Mother 9       facial radiation for acne as teen  . Lymphoma Paternal Aunt 46       deceased 11  . Breast cancer Paternal Aunt     ALLERGIES:  has No Known Allergies.  MEDICATIONS:  Current Outpatient Medications  Medication Sig Dispense Refill  . amoxicillin-clavulanate (AUGMENTIN) 875-125 MG tablet Take 1 tablet by mouth 2 (two) times daily. 10 tablet 0  . gabapentin (NEURONTIN) 100 MG capsule Take 1 capsule (100 mg total) by mouth 3 (three) times daily. 90 capsule 2  . loperamide (IMODIUM) 2 MG capsule Take 1 capsule (2 mg total) by mouth as needed for diarrhea or loose stools. 30 capsule 0  . loratadine (CLARITIN) 10 MG tablet Take 10 mg by mouth daily as needed for allergies.    . magic mouthwash w/lidocaine SOLN Take 10 mLs by mouth 3 (three) times daily as needed for mouth pain. Swish and spit 10 ML by mouth 3 times daily as needed for mouth pain. 240 mL 0  . meclizine (ANTIVERT) 25 MG tablet Take 25 mg by mouth 3 (three) times daily as needed for dizziness.    . ondansetron (ZOFRAN-ODT) 8 MG disintegrating tablet TAKE 1 TABLET BY MOUTH EVERY 8 HOURS AS NEEDED FOR NAUSE OR VOMITING 40 tablet 0  . potassium chloride (K-DUR) 10 MEQ  tablet Take 1 tablet (10 mEq total) by mouth 4 (four) times daily. 360 tablet 2  . potassium chloride (K-DUR) 10 MEQ tablet TAKE 1 TABLET(10 MEQ) BY MOUTH FOUR TIMES DAILY 120 tablet 0  . prochlorperazine (COMPAZINE) 10 MG tablet Take 1 tablet (10 mg total) by mouth every 6 (six) hours as needed for nausea or vomiting. 40 tablet 2  . traMADol (ULTRAM) 50 MG tablet Take 1 tablet (50 mg total) by mouth every 6 (six) hours as needed. 30 tablet 0   No current facility-administered medications for this visit.     REVIEW OF SYSTEMS:  Constitutional: Denies fevers, chills or abnormal night sweats (+) better energy Eyes: Denies blurriness of vision, double vision or watery eyes Ears, nose, mouth, throat, and face: Denies mucositis or sore throat Respiratory: Denies cough, dyspnea or wheezes Cardiovascular: Denies palpitation, chest discomfort or lower extremity swelling Gastrointestinal:  No constipation, diarrhea, or new abdominal pain  Skin: Denies abnormal skin rashes, tender knots down lower legs bilaterally Lymphatics: Denies new lymphadenopathy or easy bruising Neurological:Denies new weaknesses (+) numbness and tingling in all extremities, stable  Behavioral/Psych: Mood is stable, no new changes  All other systems were reviewed with the patient and are negative.  PHYSICAL EXAMINATION:  ECOG PERFORMANCE STATUS: 1 Vitals: BP 101/73 Pulse 83 RR 16 GENERAL:alert, no distress and comfortable  SKIN: skin color, texture, turgor are normal, no rashes or significant lesions EYES: normal, conjunctiva are pink and non-injected, sclera clear OROPHARYNX:no exudate, no erythema and lips, buccal mucosa, and tongue normal  NECK: supple, thyroid normal size, non-tender, without nodularity LYMPH:  no palpable lymphadenopathy in the cervical, axillary or inguinal LUNGS: clear to auscultation and percussion with normal breathing effort HEART: regular rate & rhythm and no murmurs and no lower extremity  edema ABDOMEN:abdomen soft, normal bowel sounds. No hepatomegaly or tenderness on palpation (+) small hernia in the left inguial area Musculoskeletal:no cyanosis of digits and no clubbing  PSYCH: alert & oriented x 3 with fluent speech NEURO: no focal motor deficit.   LABORATORY DATA:  I have reviewed the data as listed CBC Latest Ref Rng &  Units 06/27/2018 06/13/2018 05/30/2018  WBC 4.0 - 10.5 K/uL 13.3(H) 9.6 6.0  Hemoglobin 12.0 - 15.0 g/dL 9.0(L) 7.9(L) 8.1(L)  Hematocrit 36.0 - 46.0 % 27.7(L) 24.3(L) 23.3(L)  Platelets 150 - 400 K/uL 143(L) 138(L) 123(L)    CMP Latest Ref Rng & Units 06/27/2018 06/13/2018 05/30/2018  Glucose 70 - 99 mg/dL 111(H) 116(H) 113(H)  BUN 6 - 20 mg/dL 18 20 21(H)  Creatinine 0.44 - 1.00 mg/dL 1.32(H) 1.36(H) 1.28(H)  Sodium 135 - 145 mmol/L 140 140 140  Potassium 3.5 - 5.1 mmol/L 3.9 3.7 3.7  Chloride 98 - 111 mmol/L 104 105 104  CO2 22 - 32 mmol/L 27 25 27   Calcium 8.9 - 10.3 mg/dL 9.8 9.5 9.6  Total Protein 6.5 - 8.1 g/dL 8.0 7.9 7.6  Total Bilirubin 0.3 - 1.2 mg/dL 0.5 0.5 0.9  Alkaline Phos 38 - 126 U/L 419(H) 392(H) 370(H)  AST 15 - 41 U/L 72(H) 65(H) 62(H)  ALT 0 - 44 U/L 91(H) 73(H) 76(H)    Tumor Markers CA19.9 (0-35U/ML) 08/19/2018: 155 09/21/2017: 67 11/15/2017: 33 02/23/2018: 25 05/30/2018: 19    PATHOLOGY 11/22/2017 Molecular Pathology The following genes were evaluated for sequence changes and exonic deletions/duplications: ALK, APC, ATM, AXIN2, BAP1, BARD1, BLM, BMPR1A, BRCA1, BRCA2, BRIP1, CASR, CDC73, CDH1, CDK4, CDKN1B, CDKN1C, CDKN2A (p14ARF), CDKN2A (p16INK4a), CEBPA, CHEK2, CTNNA1, DICER1, DIS3L2, EPCAM*, FH, FLCN, GATA2, GPC3, GREM1*, HRAS, KIT, MAX, MEN1, MET, MLH1, MSH2, MSH3, MSH6, MUTYH, NBN, NF1, NF2, PALB2, PDGFRA, PHOX2B*, PMS2, POLD1, POLE, POT1, PRKAR1A, PTCH1, PTEN, RAD50, RAD51C, RAD51D, RB1, RECQL4, RET, RUNX1, SDHAF2, SDHB, SDHC, SDHD, SMAD4, SMARCA4, SMARCB1, SMARCE1, STK11, SUFU, TERC, TERT, TMEM127, TP53, TSC1,  TSC2, VHL, WRN*, WT1 The following genes were evaluated for sequence changes only: EGFR*, HOXB13*, MITF*, NTHL1*, SDHA Results are negative unless otherwise indicated    Liver Biopsy  Diagnosis 08/05/17 Liver, needle/core biopsy - CARCINOMA. - SEE COMMENT. Microscopic Comment The malignant cells are positive for cytokeratin 7. They are negative for arginase, CDX2, cytokeratin 20, estrogen receptor, GATA-3, GCDFP, Glypican 3, Hep Par 1, Napsin A, and TTF-1. This immunohistochemical is nonspecific. Possibly primary sources include pancreatobiliary and upper gastrointestinal. Radiologic correlation is necessary. Of note, organ specific markers (GATA-3, GCDFP-breast, TTF-1, Napsin A-lung, and CDX2-colon) are negative. (JBK:ecj 08/09/2017)   Diagnosis 03/07/13 Surgical, cecum, polyp -TUBULAR ADENOMA -NEGATIVE FOR HIGH GRADE DYSPLASIA   PROCEDURES  Colonoscopy by Dr. Paulita Fujita 03/07/13 IMPRESSION:  -One 5 m polyp in the cecum. Resected and retrieved -The distal rectum and anal verge are normal on retroflexion view.  -The examination was otherwise normal.    RADIOGRAPHIC STUDIES: I have personally reviewed the radiological images as listed and agreed with the findings in the report.  05/17/2018 PET Scan IMPRESSION: 1. Mixed response. The 2 small right lung nodules have decreased in size in the interval. Single focus of increased uptake within the liver is new from 02/21/2018 and concerning for recurrent metabolically active liver metastasis. 2. Splenomegaly.  New from previous exam. 3. Diffusely increased bone marrow activity, likely reflecting treatment related changes.  02/21/2018 PET Scan  IMPRESSION: 1. There are 2 new small nodules identified within the right lung which measure up to 5 mm. These are too small to reliably characterize by PET-CT, but warrant close interval follow-up. 2. Again noted are multifocal liver metastasis. The target lesion within the left lobe is  slightly decreased in size when compared with the previous exam. Similar to previous exam there is no abnormal hypermetabolism above background liver activity identified within  the liver lesions. 3. Gallstones.  PET Scan 12/17/17 IMPRESSION: 1. Although the liver metastases are still visible on the CT images, they are significantly smaller and retain no abnormal metabolic activity, consistent with response to therapy. 2. No abnormal activity within the pancreas. 3. No disease progression identified. 4. Cholelithiasis.  CT CAP WO Contrast 10/14/17 IMPRESSION: Evidence of known numerous liver metastases without significant interval change. No other evidence of metastatic disease within the chest, abdomen or pelvis. Cholelithiasis. Tiny pericardial effusion.   ASSESSMENT & PLAN:  Morgan Dawson is a 57 y.o. caucasian female with a history of vertigo and menieres disease, presented with epigastric discomfort, low appetite and 5 pound weight loss   1. 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  07/29/17 PET scan shows diffuse metastatic disease in her enlarged liver.  This is most consistent with diffuse liver metastasis.  -Her liver biopsy confirmed metastatic adenocarcinoma, most consistent with biliary or pancreatic primary this was discussed with patient.  -After further reviewing her 07/29/17 PET in the GI Tumor Board, the initial uptake in the duodenum is felt to be in the pancrease tail, which represents the primary tumor.  The consensus from GI tumor board is this is most consistent with metastatic pancreatic adenocarcinoma to liver.  We felt she does not need further imaging or EUS to confirm the primary site.  -Given her metastatic stage IV disease, her metastatic cancer is unfortunately not curable, and the goal of therapy is  palliative, to prolong her life and improve her quality of life.  -Goal of therapy is to control the disease.  -she started first line FOLFIRINOX on 08/21/17.  -She has been tolerating chemotherapy moderately well, with some nausea, cold sensitivity, diarrhea, and fatigue, she has developed slightly worsening renal function, has received IV fluids intermittently  -She has had excellent response to chemotherapy based on her restaging CT and PET.  Pet IN 11/2017 showed complete metabolic response -Her CA 19.9 continues to decrease, corresponding to her good response to chemotherapy. -She has developed persistent peripheral neuropathy, grade 2, I stopped her oxaliplatin on 02/10/2018 -Her treatment was switched to 5-FU pump infusion and liposomal irinotecan, every 2 weeks.  Due to her cytopenia, I reduced the dose, she has been tolerating very well. -PET scan from 05/17/2018 revealed overall stable disease, a few small lung nodules have decreased in size, 1 of the liver lesions is slightly more hypermetabolic, although size has decreased slightly.  No other new lesions. I recommended her to continue current regiment, with close follow-up scan in 2-3 months. I discussed with the patient and she agrees with the plan.  -Her tumor foundation one genomic testing showed MSI stable disease, low tumor mutation burden, she is not a candidate for immunotherapy.  There is no targetable mutation detected -Lab reviewed, CBC showed Hg 9 platelets 143K WBC 13.3K. CMP showed Cr 1.32  AST 72 and ALT 91. adequate for treatment.   - She knows to avoid large crowds and measure her temperature if not feeling well. I advised her to call if any warming symptoms occur.  -f/u in 2 weeks, plan to repeat scan in early Jan if she is clinically doing well and CA19.9 remains to be normal    2. Anorexia, weight loss -She has anxiety about eating due to emesis.  -I previously encouraged her to eat  more small meals with bland or liquid  diet. She can increase her intake of ensure boost -Follow-up with dietitian -weight stable lately   3. Hypercalcemia -Secondary to her underlying malignancy -Received Zometa and IV hydration in December 2018, hypercalcemia has resolved. -She restarted dairy products. -She had cramps, her Ca was good to restart calcium and magnesium.  4. Transaminitis -Due to her underlying liver metastasis and chemo  -Improved since she started chemo, slightly worse again lately  -Monitoring  5. 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 Hypokalemia, CKD stage III -She takes 10-40 mEq on a given day depending on her ability to swallow pills. K was 3.0 10/04/17. She does not have heavy GI output. I recommended she take 20 mEq daily consistently.  -Creatinine continues to increase; she appears adequately hydrated. Could be related to chemotherapy. Will check mag to make sure it is normal. Will obtain uric acid to r/o tumor lysis.  -will check labs closely, add on with pump d/c on day 3 -I stopped her spironolactone on 11/01/17 due to high Cr -US renal from 11/08/17 revealed hyperechoic renal cortex bilaterally without hydronephrosis -continue KCL supplement, she takes 40 mEq daily  7. Genetics -She previously met with genetic counselor and test was not recommended  -I informed pt that testing will be beneficial to know if she will be a candidate for other drugs in the future if she is tested positive for Lynch Syndrome or BRCA1/BRCA2 mutation carrier  -Her genetic testing (83 cancer related genes) was negative  8. Peripheral neuropathy, Grade 2 -Secondary to Oxaloplatin, it has become persistent lately,  with mild hand dysfunction (writing)  -I stopped oxaliplatin at the cycle 12  -I previously suggested to her to take a B complex. -Her neuropathy is getting worse and is bothering her. I previously prescribed Gabapentin for her and monitor. She will start at 100 mg at bedtime, and gradually increase dose to 300, and she will use during the day if needed and if she tolerates.   9. Goal of care discussion  -We previously discussed the incurable nature of her cancer, and the overall poor prognosis, especially if she does not have good response to chemotherapy or progress on chemo -The patient understands the goal of care is palliative. -I previously recommended DNR/DNI, she will think about it    PLAN: -Lab reviewed, adequate for treatment, will proceed chemo today and continue every 2 weeks with neulasta on day 3 -f/u in 2 weeks -Scan early January if stable     All questions were answered. The patient knows to call the clinic with any problems, questions or concerns. I spent 20 minutes counseling the patient face to face. The total time spent in the appointment was 25 minutes and more than 50% was on counseling.  Dierdre Searles Dweik am acting as scribe for Dr. [name].  I have reviewed the above documentation for accuracy and completeness, and I agree with the above.     [name], MD 06/27/2018

## 2018-06-27 NOTE — Patient Instructions (Signed)
Implanted Port Home Guide An implanted port is a type of central line that is placed under the skin. Central lines are used to provide IV access when treatment or nutrition needs to be given through a person's veins. Implanted ports are used for long-term IV access. An implanted port may be placed because:  You need IV medicine that would be irritating to the small veins in your hands or arms.  You need long-term IV medicines, such as antibiotics.  You need IV nutrition for a long period.  You need frequent blood draws for lab tests.  You need dialysis.  Implanted ports are usually placed in the chest area, but they can also be placed in the upper arm, the abdomen, or the leg. An implanted port has two main parts:  Reservoir. The reservoir is round and will appear as a small, raised area under your skin. The reservoir is the part where a needle is inserted to give medicines or draw blood.  Catheter. The catheter is a thin, flexible tube that extends from the reservoir. The catheter is placed into a large vein. Medicine that is inserted into the reservoir goes into the catheter and then into the vein.  How will I care for my incision site? Do not get the incision site wet. Bathe or shower as directed by your health care provider. How is my port accessed? Special steps must be taken to access the port:  Before the port is accessed, a numbing cream can be placed on the skin. This helps numb the skin over the port site.  Your health care provider uses a sterile technique to access the port. ? Your health care provider must put on a mask and sterile gloves. ? The skin over your port is cleaned carefully with an antiseptic and allowed to dry. ? The port is gently pinched between sterile gloves, and a needle is inserted into the port.  Only "non-coring" port needles should be used to access the port. Once the port is accessed, a blood return should be checked. This helps ensure that the port  is in the vein and is not clogged.  If your port needs to remain accessed for a constant infusion, a clear (transparent) bandage will be placed over the needle site. The bandage and needle will need to be changed every week, or as directed by your health care provider.  Keep the bandage covering the needle clean and dry. Do not get it wet. Follow your health care provider's instructions on how to take a shower or bath while the port is accessed.  If your port does not need to stay accessed, no bandage is needed over the port.  What is flushing? Flushing helps keep the port from getting clogged. Follow your health care provider's instructions on how and when to flush the port. Ports are usually flushed with saline solution or a medicine called heparin. The need for flushing will depend on how the port is used.  If the port is used for intermittent medicines or blood draws, the port will need to be flushed: ? After medicines have been given. ? After blood has been drawn. ? As part of routine maintenance.  If a constant infusion is running, the port may not need to be flushed.  How long will my port stay implanted? The port can stay in for as long as your health care provider thinks it is needed. When it is time for the port to come out, surgery will be   done to remove it. The procedure is similar to the one performed when the port was put in. When should I seek immediate medical care? When you have an implanted port, you should seek immediate medical care if:  You notice a bad smell coming from the incision site.  You have swelling, redness, or drainage at the incision site.  You have more swelling or pain at the port site or the surrounding area.  You have a fever that is not controlled with medicine.  This information is not intended to replace advice given to you by your health care provider. Make sure you discuss any questions you have with your health care provider. Document  Released: 08/10/2005 Document Revised: 01/16/2016 Document Reviewed: 04/17/2013 Elsevier Interactive Patient Education  2017 Elsevier Inc.  

## 2018-06-27 NOTE — Patient Instructions (Signed)
[facility] Discharge Instructions for Patients Receiving Chemotherapy  Today you received the following chemotherapy agents Irinotecan, Leucovorin & Fluorouracil (Adrucil).  To help prevent nausea and vomiting after your treatment, we encourage you to take your nausea medication as prescribed.   If you develop nausea and vomiting that is not controlled by your nausea medication, call the clinic.   BELOW ARE SYMPTOMS THAT SHOULD BE REPORTED IMMEDIATELY:  *FEVER GREATER THAN 100.5 F  *CHILLS WITH OR WITHOUT FEVER  NAUSEA AND VOMITING THAT IS NOT CONTROLLED WITH YOUR NAUSEA MEDICATION  *UNUSUAL SHORTNESS OF BREATH  *UNUSUAL BRUISING OR BLEEDING  TENDERNESS IN MOUTH AND THROAT WITH OR WITHOUT PRESENCE OF ULCERS  *URINARY PROBLEMS  *BOWEL PROBLEMS  UNUSUAL RASH Items with * indicate a potential emergency and should be followed up as soon as possible.  Feel free to call the clinic should you have any questions or concerns. The clinic phone number is [phone].  Please show the [facility] at check-in to the Emergency Department and triage nurse.

## 2018-06-27 NOTE — Telephone Encounter (Signed)
No los 11/4

## 2018-06-28 LAB — CANCER ANTIGEN 19-9: CA 19-9: 27 U/mL (ref 0–35)

## 2018-06-29 ENCOUNTER — Inpatient Hospital Stay: Payer: 59

## 2018-06-29 VITALS — BP 108/72 | HR 82 | Temp 98.5°F | Resp 16

## 2018-06-29 DIAGNOSIS — C787 Secondary malignant neoplasm of liver and intrahepatic bile duct: Principal | ICD-10-CM

## 2018-06-29 DIAGNOSIS — C251 Malignant neoplasm of body of pancreas: Secondary | ICD-10-CM

## 2018-06-29 DIAGNOSIS — C259 Malignant neoplasm of pancreas, unspecified: Secondary | ICD-10-CM

## 2018-06-29 DIAGNOSIS — Z5111 Encounter for antineoplastic chemotherapy: Secondary | ICD-10-CM | POA: Diagnosis not present

## 2018-06-29 DIAGNOSIS — Z7189 Other specified counseling: Secondary | ICD-10-CM

## 2018-06-29 MED ORDER — PEGFILGRASTIM INJECTION 6 MG/0.6ML ~~LOC~~
PREFILLED_SYRINGE | SUBCUTANEOUS | Status: AC
  Filled 2018-06-29: qty 0.6

## 2018-06-29 MED ORDER — HEPARIN SOD (PORK) LOCK FLUSH 100 UNIT/ML IV SOLN
500.0000 [IU] | Freq: Once | INTRAVENOUS | Status: AC | PRN
  Administered 2018-06-29: 500 [IU]
  Filled 2018-06-29: qty 5

## 2018-06-29 MED ORDER — PEGFILGRASTIM INJECTION 6 MG/0.6ML ~~LOC~~
6.0000 mg | PREFILLED_SYRINGE | Freq: Once | SUBCUTANEOUS | Status: AC
  Administered 2018-06-29: 6 mg via SUBCUTANEOUS

## 2018-06-29 MED ORDER — SODIUM CHLORIDE 0.9% FLUSH
10.0000 mL | INTRAVENOUS | Status: DC | PRN
  Administered 2018-06-29: 10 mL
  Filled 2018-06-29: qty 10

## 2018-07-11 ENCOUNTER — Inpatient Hospital Stay (HOSPITAL_BASED_OUTPATIENT_CLINIC_OR_DEPARTMENT_OTHER): Payer: 59 | Admitting: Hematology

## 2018-07-11 ENCOUNTER — Inpatient Hospital Stay: Payer: 59

## 2018-07-11 ENCOUNTER — Encounter: Payer: Self-pay | Admitting: Hematology

## 2018-07-11 VITALS — BP 113/74 | HR 83 | Temp 98.5°F | Resp 16 | Ht 65.0 in | Wt 103.3 lb

## 2018-07-11 DIAGNOSIS — C251 Malignant neoplasm of body of pancreas: Secondary | ICD-10-CM

## 2018-07-11 DIAGNOSIS — C259 Malignant neoplasm of pancreas, unspecified: Secondary | ICD-10-CM | POA: Diagnosis not present

## 2018-07-11 DIAGNOSIS — R630 Anorexia: Secondary | ICD-10-CM

## 2018-07-11 DIAGNOSIS — R634 Abnormal weight loss: Secondary | ICD-10-CM

## 2018-07-11 DIAGNOSIS — Z5111 Encounter for antineoplastic chemotherapy: Secondary | ICD-10-CM | POA: Diagnosis not present

## 2018-07-11 DIAGNOSIS — N183 Chronic kidney disease, stage 3 unspecified: Secondary | ICD-10-CM

## 2018-07-11 DIAGNOSIS — H8109 Meniere's disease, unspecified ear: Secondary | ICD-10-CM

## 2018-07-11 DIAGNOSIS — Z7189 Other specified counseling: Secondary | ICD-10-CM

## 2018-07-11 DIAGNOSIS — G620 Drug-induced polyneuropathy: Secondary | ICD-10-CM

## 2018-07-11 DIAGNOSIS — E876 Hypokalemia: Secondary | ICD-10-CM

## 2018-07-11 DIAGNOSIS — R21 Rash and other nonspecific skin eruption: Secondary | ICD-10-CM

## 2018-07-11 DIAGNOSIS — D696 Thrombocytopenia, unspecified: Secondary | ICD-10-CM

## 2018-07-11 DIAGNOSIS — C787 Secondary malignant neoplasm of liver and intrahepatic bile duct: Principal | ICD-10-CM

## 2018-07-11 DIAGNOSIS — D61810 Antineoplastic chemotherapy induced pancytopenia: Secondary | ICD-10-CM

## 2018-07-11 DIAGNOSIS — R740 Nonspecific elevation of levels of transaminase and lactic acid dehydrogenase [LDH]: Secondary | ICD-10-CM

## 2018-07-11 DIAGNOSIS — T451X5A Adverse effect of antineoplastic and immunosuppressive drugs, initial encounter: Secondary | ICD-10-CM

## 2018-07-11 DIAGNOSIS — R229 Localized swelling, mass and lump, unspecified: Secondary | ICD-10-CM

## 2018-07-11 DIAGNOSIS — Z95828 Presence of other vascular implants and grafts: Secondary | ICD-10-CM

## 2018-07-11 LAB — COMPREHENSIVE METABOLIC PANEL
ALK PHOS: 412 U/L — AB (ref 38–126)
ALT: 75 U/L — AB (ref 0–44)
AST: 69 U/L — ABNORMAL HIGH (ref 15–41)
Albumin: 3.2 g/dL — ABNORMAL LOW (ref 3.5–5.0)
Anion gap: 8 (ref 5–15)
BILIRUBIN TOTAL: 0.4 mg/dL (ref 0.3–1.2)
BUN: 17 mg/dL (ref 6–20)
CALCIUM: 9.6 mg/dL (ref 8.9–10.3)
CO2: 27 mmol/L (ref 22–32)
CREATININE: 1.27 mg/dL — AB (ref 0.44–1.00)
Chloride: 105 mmol/L (ref 98–111)
GFR calc non Af Amer: 47 mL/min — ABNORMAL LOW (ref >60)
GFR, EST AFRICAN AMERICAN: 54 mL/min — AB (ref >60)
Glucose, Bld: 109 mg/dL — ABNORMAL HIGH (ref 70–99)
Potassium: 3.7 mmol/L (ref 3.5–5.1)
SODIUM: 140 mmol/L (ref 135–145)
Total Protein: 7.8 g/dL (ref 6.5–8.1)

## 2018-07-11 LAB — CBC WITH DIFFERENTIAL/PLATELET
Abs Immature Granulocytes: 0.17 10*3/uL — ABNORMAL HIGH (ref 0.00–0.07)
BASOS ABS: 0 10*3/uL (ref 0.0–0.1)
Basophils Relative: 0 %
EOS ABS: 0.2 10*3/uL (ref 0.0–0.5)
EOS PCT: 2 %
HEMATOCRIT: 27.3 % — AB (ref 36.0–46.0)
Hemoglobin: 8.7 g/dL — ABNORMAL LOW (ref 12.0–15.0)
Immature Granulocytes: 1 %
LYMPHS ABS: 0.6 10*3/uL — AB (ref 0.7–4.0)
Lymphocytes Relative: 5 %
MCH: 33 pg (ref 26.0–34.0)
MCHC: 31.9 g/dL (ref 30.0–36.0)
MCV: 103.4 fL — ABNORMAL HIGH (ref 80.0–100.0)
MONOS PCT: 9 %
Monocytes Absolute: 1.1 10*3/uL — ABNORMAL HIGH (ref 0.1–1.0)
NRBC: 0 % (ref 0.0–0.2)
Neutro Abs: 10.3 10*3/uL — ABNORMAL HIGH (ref 1.7–7.7)
Neutrophils Relative %: 83 %
Platelets: 146 10*3/uL — ABNORMAL LOW (ref 150–400)
RBC: 2.64 MIL/uL — ABNORMAL LOW (ref 3.87–5.11)
RDW: 15.9 % — AB (ref 11.5–15.5)
WBC: 12.3 10*3/uL — ABNORMAL HIGH (ref 4.0–10.5)

## 2018-07-11 MED ORDER — SODIUM CHLORIDE 0.9% FLUSH
10.0000 mL | INTRAVENOUS | Status: DC | PRN
  Administered 2018-07-11: 10 mL via INTRAVENOUS
  Filled 2018-07-11: qty 10

## 2018-07-11 MED ORDER — LEUCOVORIN CALCIUM INJECTION 350 MG
400.0000 mg/m2 | Freq: Once | INTRAVENOUS | Status: AC
  Administered 2018-07-11: 592 mg via INTRAVENOUS
  Filled 2018-07-11: qty 29.6

## 2018-07-11 MED ORDER — SODIUM CHLORIDE 0.9 % IV SOLN
Freq: Once | INTRAVENOUS | Status: AC
  Administered 2018-07-11: 10:00:00 via INTRAVENOUS
  Filled 2018-07-11: qty 250

## 2018-07-11 MED ORDER — SODIUM CHLORIDE 0.9 % IV SOLN
1800.0000 mg/m2 | INTRAVENOUS | Status: DC
  Administered 2018-07-11: 2650 mg via INTRAVENOUS
  Filled 2018-07-11: qty 53

## 2018-07-11 MED ORDER — HEPARIN SOD (PORK) LOCK FLUSH 100 UNIT/ML IV SOLN
500.0000 [IU] | Freq: Once | INTRAVENOUS | Status: DC | PRN
  Filled 2018-07-11: qty 5

## 2018-07-11 MED ORDER — SODIUM CHLORIDE 0.9 % IV SOLN
60.0000 mg | Freq: Once | INTRAVENOUS | Status: AC
  Administered 2018-07-11: 60 mg via INTRAVENOUS
  Filled 2018-07-11: qty 13.95

## 2018-07-11 MED ORDER — SODIUM CHLORIDE 0.9% FLUSH
10.0000 mL | INTRAVENOUS | Status: DC | PRN
  Filled 2018-07-11: qty 10

## 2018-07-11 MED ORDER — PROCHLORPERAZINE MALEATE 10 MG PO TABS
ORAL_TABLET | ORAL | Status: AC
  Filled 2018-07-11: qty 1

## 2018-07-11 MED ORDER — PROCHLORPERAZINE MALEATE 10 MG PO TABS
10.0000 mg | ORAL_TABLET | Freq: Once | ORAL | Status: AC
  Administered 2018-07-11: 10 mg via ORAL

## 2018-07-11 NOTE — Progress Notes (Signed)
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 Cholelithiasis.  No secondary signs of acute cholecystitis. 2. Multiple solid liver masses measuring up to 6.5 cm in the left lobe of the liver, evaluation for metastatic disease is recommended. These results will be called to the ordering clinician or representative by the Radiologist Assistant, and communication documented in the PACS or zVision Dashboard.    07/23/2017 Imaging    CT Abdomen W Contrast 07/23/17 IMPRESSION: 1. Widespread metastatic disease throughout the liver. No clear primary malignancy identified in the abdomen. The pelvis was not imaged. Tissue sampling recommended. 2. Probable adenopathy superior to the pancreatic tail. No evidence of pancreatic mass. 3. Suspected incidental hemangioma inferiorly in the right hepatic lobe. 4. Nonspecific nodularity in the breasts. The patient has undergone recent (03/25/2017 and 04/01/2017) mammography and ultrasound.    07/29/2017 Initial Diagnosis    Metastasis to liver of unknown origin ([place])    07/29/2017 PET scan    PET 07/29/17  IMPRESSION: 1. Numerous bulky liver masses are hypermetabolic compatible with malignancy. 2. Accentuated activity within or along the pancreatic tail, likely represent a primary pancreatic tumor. Consider pancreatic protocol MRI to further work  this up. 3. The peripancreatic lymph node shown above the pancreatic tail is mildly hypermetabolic favoring malignancy. 4.  Prominent stool throughout the colon favors constipation. 5. 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    08/05/2017 Pathology Results    Liver Biopsy  Diagnosis 08/05/17 Liver, needle/core biopsy - CARCINOMA. - SEE COMMENT. Microscopic Comment The malignant cells are positive for cytokeratin 7. They are negative for arginase, CDX2, cytokeratin 20, estrogen receptor, GATA-3, GCDFP, Glypican 3, Hep Par 1, Napsin A, and TTF-1. This immunohistochemical is nonspecific. Possibly primary sources include pancreatobiliary and upper gastrointestinal. Radiologic correlation is necessary. Of note, organ specific markers (GATA-3, GCDFP-breast, TTF-1, Napsin A-lung, and CDX2-colon) are negative. (JBK:ecj 08/09/2017)    08/21/2017 -  Chemotherapy    FOLFIRINOX every 2 weeks starting 08/21/17      10/14/2017 Imaging    CT CAP WO Contrast 10/14/17 IMPRESSION: Evidence of known numerous liver metastases without significant interval change. No other evidence of metastatic disease within the chest, abdomen or pelvis. Cholelithiasis. Tiny pericardial effusion.    12/02/2017 Genetic Testing    Negative for pathogenic mutation.  The genes analyzed were the 83 genes on [facility]'s Multi-Cancer panel (ALK, APC, ATM, AXIN2, BAP1, BARD1, BLM, BMPR1A, BRCA1, BRCA2, BRIP1, CASR, CDC73, CDH1, CDK4, CDKN1B, CDKN1C, CDKN2A, CEBPA, CHEK2, CTNNA1, DICER1, DIS3L2, EGFR, EPCAM, FH, FLCN, GATA2, GPC3, GREM1, HOXB13, HRAS, KIT, MAX, MEN1, MET, MITF, MLH1, MSH2, MSH3, MSH6, MUTYH, NBN, NF1, NF2, NTHL1, PALB2, PDGFRA, PHOX2B, PMS2, POLD1, POLE, POT1, PRKAR1A, PTCH1, PTEN, RAD50, RAD51C, RAD51D, RB1, RECQL4, RET, RUNX1, SDHA, SDHAF2, SDHB, SDHC, SDHD, SMAD4, SMARCA4, SMARCB1, SMARCE1, STK11, SUFU, TERC, TERT, TMEM127, TP53, TSC1, TSC2, VHL, WRN, WT1).    12/17/2017 PET scan    IMPRESSION:  1. Although the  liver metastases are still visible on the CT images, they are significantly smaller and retain no abnormal metabolic activity, consistent with response to therapy. 2. No abnormal activity within the pancreas. 3. No disease progression identified. 4. Cholelithiasis.    02/21/2018 PET scan    02/21/2018 PET Scan  IMPRESSION: 1. There are 2 new small nodules identified within the right lung which measure up to 5 mm. These are too small to reliably characterize by PET-CT, but warrant close interval follow-up. 2. Again noted are multifocal liver metastasis. The target lesion within the left lobe is slightly decreased in size when compared with the previous exam. Similar to previous exam there is no abnormal hypermetabolism above background liver activity identified within the liver lesions. 3. Gallstones.    02/22/2018 -  Chemotherapy    5-FU pump infusion and liposomal irinotecan, every 2 weeks.  First cycle dose reduced due to cytopenia in the travel.    05/17/2018 Imaging    05/17/2018 PET Scan IMPRESSION: 1. Mixed response. The 2 small right lung nodules have decreased in size in the interval. Single focus of increased uptake within the liver is new from 02/21/2018 and concerning for recurrent metabolically active liver metastasis. 2. Splenomegaly.  New from previous exam. 3. Diffusely increased bone marrow activity, likely reflecting treatment related changes.     Pancreatic cancer metastasized to liver ([place])   08/11/2017 Initial Diagnosis    Pancreatic cancer metastasized to liver [facility])      HISTORY OF PRESENTING ILLNESS: 07/30/17  Morgan Dawson 57 y.o. 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 The patient was referred by her PCP Dr. [name]. The patient presents to the clinic today accompanied by husband.  Today the patient reports the weekend before Thanksgiving she had abdominal issues with bloating and cramps. These  symptoms worsened after she would eat. This persisted past Thanksgiving so she went to her PCP.  She presented to her PCP on 07/20/17 for upper abdominal pain, nausea and bloating. She had lab work up and RUQ [place] on 07/23/17. Results showed elevated LFTs (alk phos 227, AST 191, ALT 275) and US showed multiple masses in her liver and pancreatitis. She was put on a bland die due to her pancreatitis. CT AP in 07/23/17 showed diffuse metastatic disease in her liver with no clear primary. She had a PET scan that shows possible metastasis to peripancreatic lymph node along with her known liver masses.   Today she notes she has focused soreness in RUQ. She feels nauseous and has vomited 3 times total in the past 3 weeks. She has lost weight, initially purposefully. She has lost 5-10 pounds. She has been constipated  lately. She takes dulcolax to help. She denies dark stool or GI bleeding. No hematemesis. She does feel more fatigued. She was relatively active 4-5 days a week before last month. She has not needed medication as she feels she has more discomfort (1-2/10). She has not been exercising because she is afraid of making things worse. Her appetite is low from her discomfort and nausea. She notes multiple tender knots on her lower legs that appeared 5 nights ago. There is no itching. She notes she will follow up with Dr. Paulita Fujita this month for her pancreatitis.   Her last mammogram at the [place] was 04/01/17 and mostly benign. She was recommended to repeat in 6 months. She notes she frequently has cyst in her breast  which were benign before.  Socially she is not working. She usually is active in the gym 3-4 days a week. She has 2 children (son and daughter) at 69yo and 108yo.   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 Her last episode of vertigo was a few months ago. She takes a diuretic and a low salt diet. She has jaw surgery for TMJ issues. She had endometrial ablation due to menorrhagia in  2011. She has only spotted since, no full period. Patient had a colonoscopy in 2014 with one benign polyp removed. Her father had colon cancer in his 57s. Her maternal grandfather had colon cancer in his 13s. Paternal aunt had lymphoma.   CURRENT THERAPY: second line 5-FU pump infusion and liposomal irinotecan every 2 weeks, started on 02/22/2018, dose reduction due to pancytopenia and neuropathy   INTERVAL HISTORY  Morgan Dawson is here for a follow up and ongoing treatment. She presents to the clinic today with her husband.  She is doing well overall.  She has had some nasal congestion, mild nose discharge in the morning, and a dry cough in the past few days, no fever, dyspnea, or chest pain.  Her husband had similar symptoms last week.  She also noticed recurrent skin rash on her arms and legs since a few days ago, mild tenderness, no itchiness.  She had a similar rash when she had a blood transfusion about 3 weeks ago, resolved spontaneously after 5 to 6 days.  Her neuropathy is stable, she takes Neurontin at night, and the B complex in the morning.  She is able to function well at home, and goes out for shopping etc. her appetite is decent, she feels she is eating well, but she did lose a few pounds since last visit.  10 system review of system otherwise negative.   MEDICAL HISTORY:  Past Medical History:  Diagnosis Date  . Genetic testing 12/02/2017   Multi-Cancer panel (83 genes) @ [facility] - No pathogenic mutations detected  . Meniere disease 2016  . Pancreatitis     SURGICAL HISTORY: Past Surgical History:  Procedure Laterality Date  . CERVICAL ABLATION    . ENDOMETRIAL ABLATION  2011  . IR FLUORO GUIDE PORT INSERTION RIGHT  08/12/2017  . IR US GUIDE VASC ACCESS RIGHT  08/12/2017  . MANDIBLE SURGERY  1999    SOCIAL HISTORY: Social History   Socioeconomic History  . Marital status: Married    Spouse name: Not on file  . Number of children: Not on file  . Years of education:  Not on file  . Highest education level: Not on file  Occupational History  . Not on file  Social Needs  . Financial resource strain: Not hard at all  . Food insecurity:    Worry: Never true    Inability: Never true  . Transportation needs:    Medical: No    Non-medical: No  Tobacco Use  . Smoking status: Never Smoker  . Smokeless tobacco: Never Used  Substance and Sexual Activity  . Alcohol use: No    Frequency: Never  . Drug use: No  . Sexual activity: Not Currently  Lifestyle  . Physical activity:    Days per week: 4 days    Minutes per session: 30 min  . Stress: Not at all  Relationships  . Social connections:    Talks on phone: Patient refused    Gets together: Patient refused    Attends religious service: Patient  refused    Active member of club or organization: Patient refused    Attends meetings of clubs or organizations: Patient refused    Relationship status: Patient refused  . Intimate partner violence:    Fear of current or ex partner: Patient refused    Emotionally abused: Patient refused    Physically abused: Patient refused    Forced sexual activity: Patient refused  Other Topics Concern  . Not on file  Social History Narrative  . Not on file    FAMILY HISTORY: Family History  Problem Relation Age of Onset  . Colon cancer Father 18       currently 23  . Colon cancer Maternal Grandfather   . Breast cancer Cousin   . Thyroid cancer Mother 61       facial radiation for acne as teen  . Lymphoma Paternal Aunt 26       deceased 50  . Breast cancer Paternal Aunt     ALLERGIES:  has No Known Allergies.  MEDICATIONS:  Current Outpatient Medications  Medication Sig Dispense Refill  . gabapentin (NEURONTIN) 100 MG capsule Take 1 capsule (100 mg total) by mouth 3 (three) times daily. 90 capsule 2  . loperamide (IMODIUM) 2 MG capsule Take 1 capsule (2 mg total) by mouth as needed for diarrhea or loose stools. 30 capsule 0  . loratadine (CLARITIN) 10  MG tablet Take 10 mg by mouth daily as needed for allergies.    . magic mouthwash w/lidocaine SOLN Take 10 mLs by mouth 3 (three) times daily as needed for mouth pain. Swish and spit 10 ML by mouth 3 times daily as needed for mouth pain. 240 mL 0  . meclizine (ANTIVERT) 25 MG tablet Take 25 mg by mouth 3 (three) times daily as needed for dizziness.    . ondansetron (ZOFRAN-ODT) 8 MG disintegrating tablet TAKE 1 TABLET BY MOUTH EVERY 8 HOURS AS NEEDED FOR NAUSE OR VOMITING 40 tablet 0  . potassium chloride (K-DUR) 10 MEQ tablet Take 1 tablet (10 mEq total) by mouth 4 (four) times daily. 360 tablet 2  . prochlorperazine (COMPAZINE) 10 MG tablet Take 1 tablet (10 mg total) by mouth every 6 (six) hours as needed for nausea or vomiting. 40 tablet 2  . traMADol (ULTRAM) 50 MG tablet Take 1 tablet (50 mg total) by mouth every 6 (six) hours as needed. 30 tablet 0   No current facility-administered medications for this visit.     REVIEW OF SYSTEMS:  Constitutional: Denies fevers, chills or abnormal night sweats (+) better energy Eyes: Denies blurriness of vision, double vision or watery eyes Ears, nose, mouth, throat, and face: Denies mucositis or sore throat Respiratory: Denies cough, dyspnea or wheezes Cardiovascular: Denies palpitation, chest discomfort or lower extremity swelling Gastrointestinal:  No constipation, diarrhea, or new abdominal pain  Skin: Denies abnormal skin rashes, tender knots down lower legs bilaterally Lymphatics: Denies new lymphadenopathy or easy bruising Neurological:Denies new weaknesses (+) numbness and tingling in all extremities, stable  Behavioral/Psych: Mood is stable, no new changes  All other systems were reviewed with the patient and are negative.  PHYSICAL EXAMINATION:  ECOG PERFORMANCE STATUS: 1 BP 113/74 (BP Location: Left Arm)   Pulse 83   Temp 98.5 F (36.9 C) (Oral)   Resp 16   Ht 5' 5"  (1.651 m)   Wt 103 lb 4.8 oz (46.9 kg)   LMP 10/23/2015   SpO2  100%   BMI 17.19 kg/m  GENERAL:alert, no distress and  comfortable  SKIN: skin color, texture, turgor are normal, she has several dime size rash with subcutaneous nodule on her arms, and a few on her legs, see the picture below. EYES: normal, conjunctiva are pink and non-injected, sclera clear OROPHARYNX:no exudate, no erythema and lips, buccal mucosa, and tongue normal  NECK: supple, thyroid normal size, non-tender, without nodularity LYMPH:  no palpable lymphadenopathy in the cervical, axillary or inguinal LUNGS: clear to auscultation and percussion with normal breathing effort HEART: regular rate & rhythm and no murmurs and no lower extremity edema ABDOMEN:abdomen soft, normal bowel sounds. No hepatomegaly or tenderness on palpation (+) small hernia in the left inguial area Musculoskeletal:no cyanosis of digits and no clubbing  PSYCH: alert & oriented x 3 with fluent speech NEURO: no focal motor deficit.     LABORATORY DATA:  I have reviewed the data as listed CBC Latest Ref Rng & Units 07/11/2018 06/27/2018 06/13/2018  WBC 4.0 - 10.5 K/uL 12.3(H) 13.3(H) 9.6  Hemoglobin 12.0 - 15.0 g/dL 8.7(L) 9.0(L) 7.9(L)  Hematocrit 36.0 - 46.0 % 27.3(L) 27.7(L) 24.3(L)  Platelets 150 - 400 K/uL 146(L) 143(L) 138(L)    CMP Latest Ref Rng & Units 06/27/2018 06/13/2018 05/30/2018  Glucose 70 - 99 mg/dL 111(H) 116(H) 113(H)  BUN 6 - 20 mg/dL 18 20 21(H)  Creatinine 0.44 - 1.00 mg/dL 1.32(H) 1.36(H) 1.28(H)  Sodium 135 - 145 mmol/L 140 140 140  Potassium 3.5 - 5.1 mmol/L 3.9 3.7 3.7  Chloride 98 - 111 mmol/L 104 105 104  CO2 22 - 32 mmol/L 27 25 27   Calcium 8.9 - 10.3 mg/dL 9.8 9.5 9.6  Total Protein 6.5 - 8.1 g/dL 8.0 7.9 7.6  Total Bilirubin 0.3 - 1.2 mg/dL 0.5 0.5 0.9  Alkaline Phos 38 - 126 U/L 419(H) 392(H) 370(H)  AST 15 - 41 U/L 72(H) 65(H) 62(H)  ALT 0 - 44 U/L 91(H) 73(H) 76(H)    Tumor Markers CA19.9 (0-35U/ML) 08/19/2018: 155 09/21/2017: 67 11/15/2017: 33 02/23/2018:  25 05/30/2018: 19  06/27/2018: 27   PATHOLOGY 11/22/2017 Molecular Pathology The following genes were evaluated for sequence changes and exonic deletions/duplications: ALK, APC, ATM, AXIN2, BAP1, BARD1, BLM, BMPR1A, BRCA1, BRCA2, BRIP1, CASR, CDC73, CDH1, CDK4, CDKN1B, CDKN1C, CDKN2A (p14ARF), CDKN2A (p16INK4a), CEBPA, CHEK2, CTNNA1, DICER1, DIS3L2, EPCAM*, FH, FLCN, GATA2, GPC3, GREM1*, HRAS, KIT, MAX, MEN1, MET, MLH1, MSH2, MSH3, MSH6, MUTYH, NBN, NF1, NF2, PALB2, PDGFRA, PHOX2B*, PMS2, POLD1, POLE, POT1, PRKAR1A, PTCH1, PTEN, RAD50, RAD51C, RAD51D, RB1, RECQL4, RET, RUNX1, SDHAF2, SDHB, SDHC, SDHD, SMAD4, SMARCA4, SMARCB1, SMARCE1, STK11, SUFU, TERC, TERT, TMEM127, TP53, TSC1, TSC2, VHL, WRN*, WT1 The following genes were evaluated for sequence changes only: EGFR*, HOXB13*, MITF*, NTHL1*, SDHA Results are negative unless otherwise indicated    Liver Biopsy  Diagnosis 08/05/17 Liver, needle/core biopsy - CARCINOMA. - SEE COMMENT. Microscopic Comment The malignant cells are positive for cytokeratin 7. They are negative for arginase, CDX2, cytokeratin 20, estrogen receptor, GATA-3, GCDFP, Glypican 3, Hep Par 1, Napsin A, and TTF-1. This immunohistochemical is nonspecific. Possibly primary sources include pancreatobiliary and upper gastrointestinal. Radiologic correlation is necessary. Of note, organ specific markers (GATA-3, GCDFP-breast, TTF-1, Napsin A-lung, and CDX2-colon) are negative. (JBK:ecj 08/09/2017)   Diagnosis 03/07/13 Surgical, cecum, polyp -TUBULAR ADENOMA -NEGATIVE FOR HIGH GRADE DYSPLASIA   PROCEDURES  Colonoscopy by Dr. Paulita Fujita 03/07/13 IMPRESSION:  -One 5 m polyp in the cecum. Resected and retrieved -The distal rectum and anal verge are normal on retroflexion view.  -The examination was otherwise normal.    RADIOGRAPHIC  STUDIES: I have personally reviewed the radiological images as listed and agreed with the findings in the report.  05/17/2018 PET  Scan IMPRESSION: 1. Mixed response. The 2 small right lung nodules have decreased in size in the interval. Single focus of increased uptake within the liver is new from 02/21/2018 and concerning for recurrent metabolically active liver metastasis. 2. Splenomegaly.  New from previous exam. 3. Diffusely increased bone marrow activity, likely reflecting treatment related changes.  02/21/2018 PET Scan  IMPRESSION: 1. There are 2 new small nodules identified within the right lung which measure up to 5 mm. These are too small to reliably characterize by PET-CT, but warrant close interval follow-up. 2. Again noted are multifocal liver metastasis. The target lesion within the left lobe is slightly decreased in size when compared with the previous exam. Similar to previous exam there is no abnormal hypermetabolism above background liver activity identified within the liver lesions. 3. Gallstones.  PET Scan 12/17/17 IMPRESSION: 1. Although the liver metastases are still visible on the CT images, they are significantly smaller and retain no abnormal metabolic activity, consistent with response to therapy. 2. No abnormal activity within the pancreas. 3. No disease progression identified. 4. Cholelithiasis.  CT CAP WO Contrast 10/14/17 IMPRESSION: Evidence of known numerous liver metastases without significant interval change. No other evidence of metastatic disease within the chest, abdomen or pelvis. Cholelithiasis. Tiny pericardial effusion.   ASSESSMENT & PLAN:  GLAYDS INSCO is a 57 y.o. caucasian female with a history of vertigo and menieres disease, presented with epigastric discomfort, low appetite and 5 pound weight loss   1. 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  07/29/17 PET scan shows diffuse metastatic disease in  her enlarged liver.  This is most consistent with diffuse liver metastasis.  -Her liver biopsy confirmed metastatic adenocarcinoma, most consistent with biliary or pancreatic primary this was discussed with patient.  -After further reviewing her 07/29/17 PET in the GI Tumor Board, the initial uptake in the duodenum is felt to be in the pancrease tail, which represents the primary tumor.  The consensus from GI tumor board is this is most consistent with metastatic pancreatic adenocarcinoma to liver.  We felt she does not need further imaging or EUS to confirm the primary site.  -Given her metastatic stage IV disease, her metastatic cancer is unfortunately not curable, and the goal of therapy is palliative, to prolong her life and improve her quality of life.  -Goal of therapy is to control the disease.  -she started first line FOLFIRINOX on 08/21/17.  -She has been tolerating chemotherapy moderately well, with some nausea, cold sensitivity, diarrhea, and fatigue, she has developed slightly worsening renal function, has received IV fluids intermittently  -She has had excellent response to chemotherapy based on her restaging CT and PET.  PET 11/2017 showed complete metabolic response -She has developed persistent peripheral neuropathy, grade 2, I stopped her oxaliplatin on 02/10/2018 -Her treatment was switched to 5-FU pump infusion and liposomal irinotecan, every 2 weeks.  Due to her cytopenia, I reduced the dose, she has been tolerating very well. -PET scan from 05/17/2018 revealed overall stable disease, a few small lung nodules have decreased in size, 1 of the liver lesions is slightly more hypermetabolic, although size has decreased slightly.  No other new lesions. I recommended her to continue current regiment, with  close follow-up scan in 2-3 months. I discussed with the patient and she agrees with the plan.  -Her tumor foundation one genomic testing showed MSI stable disease, low tumor mutation burden,  she is not a candidate for immunotherapy.  There is no targetable mutation detected -She is clinically doing well, tolerating the treatment very well, labs reviewed, adequate for treatment, will proceed chemo today and continue every 2 weeks -She has developed mild nasal congestion and a dry cough, she knows to watch for fever, and productive cough and call us if needed.  2. Anorexia, weight loss -She has anxiety about eating due to emesis.  -I previously encouraged her to eat more small meals with bland or liquid diet. She can increase her intake of ensure boost -Follow-up with dietitian -She has lost 5 pounds in the past months, I encouraged her to increase dietary supplement  3. Hypercalcemia -Secondary to her underlying malignancy -Received Zometa and IV hydration in December 2018, hypercalcemia has resolved. -She restarted dairy products. -She had cramps, her Ca was good to restart calcium and magnesium.  4. Transaminitis -Due to her underlying liver metastasis and chemo  -Improved since she started chemo, slightly worse again lately  -Monitoring  5. 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 Hypokalemia, CKD stage III -She takes 10-40 mEq on a given day depending on her ability to swallow pills. K was 3.0 10/04/17. She does not have heavy GI output. I recommended she take 20 mEq daily consistently.  -Creatinine continues to increase; she appears adequately hydrated. Could be related to chemotherapy. Will check mag to make sure it is normal. Will obtain uric acid to r/o tumor lysis.  -will check labs closely, add on with pump d/c on day 3 -I stopped her spironolactone on 11/01/17 due  to high Cr -US renal from 11/08/17 revealed hyperechoic renal cortex bilaterally without hydronephrosis -continue KCL supplement, she takes 40 mEq daily  7. Genetics -She previously met with genetic counselor and test was not recommended  -I informed pt that testing will be beneficial to know if she will be a candidate for other drugs in the future if she is tested positive for Lynch Syndrome or BRCA1/BRCA2 mutation carrier  -Her genetic testing (83 cancer related genes) was negative  8. Peripheral neuropathy, Grade 2 -Secondary to Oxaloplatin, it has become persistent lately, with mild hand dysfunction (writing)  -I stopped oxaliplatin at the cycle 12  -I previously suggested to her to take a B complex. -Her neuropathy is getting worse and is bothering her. I previously prescribed Gabapentin for her and monitor.  -She is on Neurontin 200 mg at night, and B complex daily, neuropathy overall mild and  stable.  9. Goal of care discussion  -We previously discussed the incurable nature of her cancer, and the overall poor prognosis, especially if she does not have good response to chemotherapy or progress on chemo -The patient understands the goal of care is palliative. -she is full code now   10. Skin rash with subcutaneous nodules -She had a similar nodular skin rash after blood transfusion 3 weeks ago, resolved spontaneously, recurred a few days ago -I encouraged her to use hydrocortisone cream, and take Benadryl if needed -will monitor  PLAN: -Lab reviewed, adequate for treatment, will proceed chemo today and continue every 2 weeks with neulasta on day 3 -f/u in 2 weeks -Scan early January if stable    All questions were answered. The patient knows to call the clinic with any problems, questions or concerns. I spent 20 minutes counseling the patient face to face. The total time spent in the appointment was 25 minutes and more than 50% was on counseling.    [name],  MD 07/11/2018

## 2018-07-11 NOTE — Patient Instructions (Signed)
[facility] Discharge Instructions for Patients Receiving Chemotherapy  Today you received the following chemotherapy agents:  Irinotecan, Leucovorin, and 5FU.   To help prevent nausea and vomiting after your treatment, we encourage you to take your nausea medication as directed.   If you develop nausea and vomiting that is not controlled by your nausea medication, call the clinic.   BELOW ARE SYMPTOMS THAT SHOULD BE REPORTED IMMEDIATELY:  *FEVER GREATER THAN 100.5 F  *CHILLS WITH OR WITHOUT FEVER  NAUSEA AND VOMITING THAT IS NOT CONTROLLED WITH YOUR NAUSEA MEDICATION  *UNUSUAL SHORTNESS OF BREATH  *UNUSUAL BRUISING OR BLEEDING  TENDERNESS IN MOUTH AND THROAT WITH OR WITHOUT PRESENCE OF ULCERS  *URINARY PROBLEMS  *BOWEL PROBLEMS  UNUSUAL RASH Items with * indicate a potential emergency and should be followed up as soon as possible.  Feel free to call the clinic should you have any questions or concerns. The clinic phone number is [phone].  Please show the CHEMO ALERT CARD at check-in to the Emergency Department and triage nurse.   

## 2018-07-13 ENCOUNTER — Inpatient Hospital Stay: Payer: 59

## 2018-07-13 VITALS — BP 97/69 | HR 90 | Temp 98.7°F | Resp 18

## 2018-07-13 DIAGNOSIS — C251 Malignant neoplasm of body of pancreas: Secondary | ICD-10-CM

## 2018-07-13 DIAGNOSIS — Z5111 Encounter for antineoplastic chemotherapy: Secondary | ICD-10-CM | POA: Diagnosis not present

## 2018-07-13 DIAGNOSIS — Z7189 Other specified counseling: Secondary | ICD-10-CM

## 2018-07-13 DIAGNOSIS — C787 Secondary malignant neoplasm of liver and intrahepatic bile duct: Principal | ICD-10-CM

## 2018-07-13 DIAGNOSIS — C259 Malignant neoplasm of pancreas, unspecified: Secondary | ICD-10-CM

## 2018-07-13 MED ORDER — PEGFILGRASTIM INJECTION 6 MG/0.6ML ~~LOC~~
6.0000 mg | PREFILLED_SYRINGE | Freq: Once | SUBCUTANEOUS | Status: AC
  Administered 2018-07-13: 6 mg via SUBCUTANEOUS

## 2018-07-13 MED ORDER — PEGFILGRASTIM INJECTION 6 MG/0.6ML ~~LOC~~
PREFILLED_SYRINGE | SUBCUTANEOUS | Status: AC
  Filled 2018-07-13: qty 0.6

## 2018-07-13 MED ORDER — SODIUM CHLORIDE 0.9% FLUSH
10.0000 mL | INTRAVENOUS | Status: DC | PRN
  Administered 2018-07-13: 10 mL
  Filled 2018-07-13: qty 10

## 2018-07-13 MED ORDER — HEPARIN SOD (PORK) LOCK FLUSH 100 UNIT/ML IV SOLN
500.0000 [IU] | Freq: Once | INTRAVENOUS | Status: AC | PRN
  Administered 2018-07-13: 500 [IU]
  Filled 2018-07-13: qty 5

## 2018-07-14 ENCOUNTER — Telehealth: Payer: Self-pay | Admitting: Hematology

## 2018-07-14 NOTE — Telephone Encounter (Signed)
Called pt regarding upcoming appts

## 2018-07-22 ENCOUNTER — Telehealth: Payer: Self-pay

## 2018-07-22 ENCOUNTER — Inpatient Hospital Stay (HOSPITAL_BASED_OUTPATIENT_CLINIC_OR_DEPARTMENT_OTHER): Payer: 59 | Admitting: Medical

## 2018-07-22 ENCOUNTER — Inpatient Hospital Stay: Payer: 59

## 2018-07-22 VITALS — BP 106/68 | HR 95 | Temp 98.6°F | Resp 18 | Ht 65.0 in | Wt 101.3 lb

## 2018-07-22 DIAGNOSIS — J069 Acute upper respiratory infection, unspecified: Secondary | ICD-10-CM | POA: Diagnosis not present

## 2018-07-22 DIAGNOSIS — C259 Malignant neoplasm of pancreas, unspecified: Secondary | ICD-10-CM | POA: Diagnosis not present

## 2018-07-22 DIAGNOSIS — C787 Secondary malignant neoplasm of liver and intrahepatic bile duct: Secondary | ICD-10-CM

## 2018-07-22 DIAGNOSIS — Z5111 Encounter for antineoplastic chemotherapy: Secondary | ICD-10-CM | POA: Diagnosis not present

## 2018-07-22 DIAGNOSIS — R05 Cough: Secondary | ICD-10-CM

## 2018-07-22 DIAGNOSIS — R059 Cough, unspecified: Secondary | ICD-10-CM

## 2018-07-22 DIAGNOSIS — C251 Malignant neoplasm of body of pancreas: Secondary | ICD-10-CM

## 2018-07-22 LAB — COMPREHENSIVE METABOLIC PANEL
ALK PHOS: 536 U/L — AB (ref 38–126)
ALT: 76 U/L — AB (ref 0–44)
AST: 65 U/L — ABNORMAL HIGH (ref 15–41)
Albumin: 3.5 g/dL (ref 3.5–5.0)
Anion gap: 9 (ref 5–15)
BILIRUBIN TOTAL: 0.5 mg/dL (ref 0.3–1.2)
BUN: 13 mg/dL (ref 6–20)
CALCIUM: 9.9 mg/dL (ref 8.9–10.3)
CHLORIDE: 102 mmol/L (ref 98–111)
CO2: 29 mmol/L (ref 22–32)
CREATININE: 1.4 mg/dL — AB (ref 0.44–1.00)
GFR, EST AFRICAN AMERICAN: 49 mL/min — AB (ref >60)
GFR, EST NON AFRICAN AMERICAN: 42 mL/min — AB (ref >60)
Glucose, Bld: 139 mg/dL — ABNORMAL HIGH (ref 70–99)
Potassium: 3.4 mmol/L — ABNORMAL LOW (ref 3.5–5.1)
Sodium: 140 mmol/L (ref 135–145)
Total Protein: 8.2 g/dL — ABNORMAL HIGH (ref 6.5–8.1)

## 2018-07-22 LAB — CBC WITH DIFFERENTIAL/PLATELET
ABS IMMATURE GRANULOCYTES: 0.14 10*3/uL — AB (ref 0.00–0.07)
BASOS PCT: 1 %
Basophils Absolute: 0.1 10*3/uL (ref 0.0–0.1)
Eosinophils Absolute: 0.2 10*3/uL (ref 0.0–0.5)
Eosinophils Relative: 1 %
HEMATOCRIT: 27.8 % — AB (ref 36.0–46.0)
HEMOGLOBIN: 9 g/dL — AB (ref 12.0–15.0)
IMMATURE GRANULOCYTES: 1 %
LYMPHS ABS: 0.6 10*3/uL — AB (ref 0.7–4.0)
LYMPHS PCT: 5 %
MCH: 32.8 pg (ref 26.0–34.0)
MCHC: 32.4 g/dL (ref 30.0–36.0)
MCV: 101.5 fL — AB (ref 80.0–100.0)
MONO ABS: 0.9 10*3/uL (ref 0.1–1.0)
MONOS PCT: 8 %
NEUTROS ABS: 9.4 10*3/uL — AB (ref 1.7–7.7)
NEUTROS PCT: 84 %
Platelets: 138 10*3/uL — ABNORMAL LOW (ref 150–400)
RBC: 2.74 MIL/uL — ABNORMAL LOW (ref 3.87–5.11)
RDW: 16.2 % — ABNORMAL HIGH (ref 11.5–15.5)
WBC: 11.2 10*3/uL — ABNORMAL HIGH (ref 4.0–10.5)
nRBC: 0 % (ref 0.0–0.2)

## 2018-07-22 MED ORDER — ALBUTEROL SULFATE HFA 108 (90 BASE) MCG/ACT IN AERS: 2.0000 | INHALATION_SPRAY | Freq: Four times a day (QID) | RESPIRATORY_TRACT | 2 refills | Status: DC | PRN

## 2018-07-22 MED ORDER — GUAIFENESIN-CODEINE 100-10 MG/5ML PO SOLN: 5.0000 mL | Freq: Four times a day (QID) | ORAL | 0 refills | Status: DC | PRN

## 2018-07-22 MED ORDER — AZITHROMYCIN 250 MG PO TABS: ORAL_TABLET | ORAL | 0 refills | Status: DC

## 2018-07-22 NOTE — Telephone Encounter (Signed)
Called patient back regarding chest cold, instructed to come in at 10:15 for lab and 10:30 to see Sandi Mealy.  Patient verbalized an understanding.

## 2018-07-22 NOTE — Patient Instructions (Signed)
Implanted Port Home Guide An implanted port is a type of central line that is placed under the skin. Central lines are used to provide IV access when treatment or nutrition needs to be given through a person's veins. Implanted ports are used for long-term IV access. An implanted port may be placed because:  You need IV medicine that would be irritating to the small veins in your hands or arms.  You need long-term IV medicines, such as antibiotics.  You need IV nutrition for a long period.  You need frequent blood draws for lab tests.  You need dialysis.  Implanted ports are usually placed in the chest area, but they can also be placed in the upper arm, the abdomen, or the leg. An implanted port has two main parts:  Reservoir. The reservoir is round and will appear as a small, raised area under your skin. The reservoir is the part where a needle is inserted to give medicines or draw blood.  Catheter. The catheter is a thin, flexible tube that extends from the reservoir. The catheter is placed into a large vein. Medicine that is inserted into the reservoir goes into the catheter and then into the vein.  How will I care for my incision site? Do not get the incision site wet. Bathe or shower as directed by your health care provider. How is my port accessed? Special steps must be taken to access the port:  Before the port is accessed, a numbing cream can be placed on the skin. This helps numb the skin over the port site.  Your health care provider uses a sterile technique to access the port. ? Your health care provider must put on a mask and sterile gloves. ? The skin over your port is cleaned carefully with an antiseptic and allowed to dry. ? The port is gently pinched between sterile gloves, and a needle is inserted into the port.  Only "non-coring" port needles should be used to access the port. Once the port is accessed, a blood return should be checked. This helps ensure that the port  is in the vein and is not clogged.  If your port needs to remain accessed for a constant infusion, a clear (transparent) bandage will be placed over the needle site. The bandage and needle will need to be changed every week, or as directed by your health care provider.  Keep the bandage covering the needle clean and dry. Do not get it wet. Follow your health care provider's instructions on how to take a shower or bath while the port is accessed.  If your port does not need to stay accessed, no bandage is needed over the port.  What is flushing? Flushing helps keep the port from getting clogged. Follow your health care provider's instructions on how and when to flush the port. Ports are usually flushed with saline solution or a medicine called heparin. The need for flushing will depend on how the port is used.  If the port is used for intermittent medicines or blood draws, the port will need to be flushed: ? After medicines have been given. ? After blood has been drawn. ? As part of routine maintenance.  If a constant infusion is running, the port may not need to be flushed.  How long will my port stay implanted? The port can stay in for as long as your health care provider thinks it is needed. When it is time for the port to come out, surgery will be   done to remove it. The procedure is similar to the one performed when the port was put in. When should I seek immediate medical care? When you have an implanted port, you should seek immediate medical care if:  You notice a bad smell coming from the incision site.  You have swelling, redness, or drainage at the incision site.  You have more swelling or pain at the port site or the surrounding area.  You have a fever that is not controlled with medicine.  This information is not intended to replace advice given to you by your health care provider. Make sure you discuss any questions you have with your health care provider. Document  Released: 08/10/2005 Document Revised: 01/16/2016 Document Reviewed: 04/17/2013 Elsevier Interactive Patient Education  2017 Elsevier Inc.  

## 2018-07-22 NOTE — Progress Notes (Signed)
Pt seen by PA Lucianne Lei only, no RN assessment at this time.  Pa aware.

## 2018-07-22 NOTE — Telephone Encounter (Signed)
Patient calls stating she developed a chest cold a few days ago, now has productive cough greenish sputum, no fever.

## 2018-07-22 NOTE — Progress Notes (Signed)
[facility] Progress Note   Morgan Dawson [id number] 1960-08-30 57 y.o.  Morgan Dawson is managed by Dr. [name]  Actively treated with chemotherapy/immunotherapy/hormonal therapy: yes  Current Therapy: 5-FU, liposomal irinotecan, and leucovorin with PEG filgrastim support.  Last Treated: 07/11/2018 (cycle 10)  Assessment: Plan:    Upper respiratory tract infection, unspecified type - Plan: albuterol (PROVENTIL HFA;VENTOLIN HFA) 108 (90 Base) MCG/ACT inhaler, azithromycin (ZITHROMAX) 250 MG tablet  Cough - Plan: albuterol (PROVENTIL HFA;VENTOLIN HFA) 108 (90 Base) MCG/ACT inhaler, guaiFENesin-codeine 100-10 MG/5ML syrup  Pancreatic cancer metastasized to liver [facility])   URI: The patient was given a prescription for an albuterol inhaler and a Z-Pak.  Cough: Patient was given a prescription for an albuterol inhaler and for Robitussin with codeine cough suppressant.  Metastatic pancreatic cancer.  The patient is status post cycle 10 of 5-FU, liposomal irinotecan, and leucovorin with PEG filgrastim support.  She will return for follow-up on 07/25/2018.  Please see After Visit Summary for patient specific instructions.  Future Appointments  Date Time Provider [place]  07/25/2018  8:45 AM [facility] LAB 3 [facility] None  07/25/2018  9:00 AM [facility] [place] FLUSH [facility] None  07/25/2018  9:30 AM Alla Feeling, NP [facility] None  07/25/2018 10:00 AM [facility] INFUSION [facility] None  07/27/2018  2:45 PM [facility] [place] FLUSH [facility] None  08/08/2018  9:00 AM [facility] LAB 3 [facility] None  08/08/2018  9:15 AM [facility] [place] FLUSH [facility] None  08/08/2018  9:45 AM [name], MD [facility] None  08/08/2018 11:00 AM [facility] INFUSION [facility] None  08/10/2018  2:45 PM [facility] [place] FLUSH [facility] None  08/23/2018  9:30 AM [facility] LAB 3 [facility] None  08/23/2018  9:45 AM [facility] [place] FLUSH [facility] None  08/23/2018 10:15 AM  Owens Shark, NP [facility] None  08/23/2018 11:00 AM [facility] INFUSION [facility] None  08/25/2018  3:45 PM [facility] [place] FLUSH [facility] None  09/05/2018  9:45 AM [facility] LAB 2 [facility] None  09/05/2018 10:00 AM [facility] [place] None  09/05/2018 10:30 AM [name], MD [facility] None  09/05/2018 11:30 AM [facility] INFUSION [facility] None  09/07/2018  3:45 PM [facility] [place] None    No orders of the defined types were placed in this encounter.      Subjective:   Patient ID:  Morgan Dawson is a 57 y.o. (DOB 24-May-1961) female.  Chief Complaint: No chief complaint on file.   HPI Morgan Dawson is a 57 year old female with a history of a metastatic pancreatic cancer with metastasis to her liver.  She is managed by Dr. [name].  She is status post cycle 10 of 5-FU, liposomal irinotecan, and leucovorin with PEG filgrastim support dosed 07/11/2018.  The patient presents to clinic today with a report of a 7-day cough with greenish sputum.  She denies fevers, chills, or sweats.  No one else in her home has been sick.  Medications: I have reviewed the patient's current medications.  Allergies: No Known Allergies  Past Medical History:  Diagnosis Date  . Genetic testing 12/02/2017   Multi-Cancer panel (83 genes) @ [facility] - No pathogenic mutations detected  . Meniere disease 2016  . Pancreatitis     Past Surgical History:  Procedure Laterality Date  . CERVICAL ABLATION    . ENDOMETRIAL ABLATION  2011  . IR FLUORO GUIDE PORT INSERTION RIGHT  08/12/2017  . IR US GUIDE VASC ACCESS RIGHT  08/12/2017  . [place]  Family History  Problem Relation Age of Onset  . Colon cancer Father 55       currently 4  . Colon cancer Maternal Grandfather   . Breast cancer Cousin   . Thyroid cancer Mother 62       facial radiation for acne as teen  . Lymphoma Paternal Aunt 3       deceased 29  . Breast cancer Paternal Aunt      Social History   Socioeconomic History  . Marital status: Married    Spouse name: Not on file  . Number of children: Not on file  . Years of education: Not on file  . Highest education level: Not on file  Occupational History  . Not on file  Social Needs  . Financial resource strain: Not hard at all  . Food insecurity:    Worry: Never true    Inability: Never true  . Transportation needs:    Medical: No    Non-medical: No  Tobacco Use  . Smoking status: Never Smoker  . Smokeless tobacco: Never Used  Substance and Sexual Activity  . Alcohol use: No    Frequency: Never  . Drug use: No  . Sexual activity: Not Currently  Lifestyle  . Physical activity:    Days per week: 4 days    Minutes per session: 30 min  . Stress: Not at all  Relationships  . Social connections:    Talks on phone: Patient refused    Gets together: Patient refused    Attends religious service: Patient refused    Active member of club or organization: Patient refused    Attends meetings of clubs or organizations: Patient refused    Relationship status: Patient refused  . Intimate partner violence:    Fear of current or ex partner: Patient refused    Emotionally abused: Patient refused    Physically abused: Patient refused    Forced sexual activity: Patient refused  Other Topics Concern  . Not on file  Social History Narrative  . Not on file    Past Medical History, Surgical history, Social history, and Family history were reviewed and updated as appropriate.   Please see review of systems for further details on the patient's review from today.   Review of Systems:  Review of Systems  Constitutional: Negative for chills, diaphoresis, fatigue and fever.  HENT: Negative for congestion, postnasal drip, rhinorrhea and sore throat.   Respiratory: Positive for cough. Negative for shortness of breath and wheezing.   Cardiovascular: Negative for palpitations.  Neurological: Negative for  headaches.    Objective:   Physical Exam:  BP 106/68 (BP Location: Left Arm, Patient Position: Sitting)   Pulse 95   Temp 98.6 F (37 C) (Oral)   Resp 18   Ht 5\' 5"  (1.651 m)   Wt 101 lb 4.8 oz (45.9 kg)   LMP 10/23/2015   SpO2 100%   BMI 16.86 kg/m  ECOG: 0  Physical Exam  Constitutional: No distress.  HENT:  Head: Normocephalic and atraumatic.  Mouth/Throat: Oropharynx is clear and moist. No oropharyngeal exudate.  Neck: Normal range of motion. Neck supple.  Cardiovascular: Normal rate and regular rhythm. Exam reveals friction rub. Exam reveals no gallop.  No murmur heard. Pulmonary/Chest: Effort normal and breath sounds normal. No respiratory distress. She has no wheezes. She has no rales.  Lymphadenopathy:    She has no cervical adenopathy.  Neurological: She is alert. Coordination normal.  Skin: Skin is warm  and dry. No rash noted. She is not diaphoretic. No erythema.    Lab Review:     Component Value Date/Time   NA 140 07/22/2018 1023   NA 134 (L) 08/23/2017 0951   K 3.4 (L) 07/22/2018 1023   K 2.5 Repeated and Verified (LL) 08/23/2017 0951   CL 102 07/22/2018 1023   CO2 29 07/22/2018 1023   CO2 26 08/23/2017 0951   GLUCOSE 139 (H) 07/22/2018 1023   GLUCOSE 116 08/23/2017 0951   BUN 13 07/22/2018 1023   BUN 11.6 08/23/2017 0951   CREATININE 1.40 (H) 07/22/2018 1023   CREATININE 1.61 (H) 01/12/2018 1130   CREATININE 1.0 08/23/2017 0951   CALCIUM 9.9 07/22/2018 1023   CALCIUM 10.4 08/23/2017 0951   PROT 8.2 (H) 07/22/2018 1023   PROT 6.4 08/23/2017 0951   ALBUMIN 3.5 07/22/2018 1023   ALBUMIN 2.9 (L) 08/23/2017 0951   AST 65 (H) 07/22/2018 1023   AST 49 (H) 01/12/2018 1130   AST 208 (HH) 08/23/2017 0951   ALT 76 (H) 07/22/2018 1023   ALT 44 01/12/2018 1130   ALT 151 (H) 08/23/2017 0951   ALKPHOS 536 (H) 07/22/2018 1023   ALKPHOS 549 (H) 08/23/2017 0951   BILITOT 0.5 07/22/2018 1023   BILITOT 1.1 01/12/2018 1130   BILITOT 0.82 08/23/2017 0951    GFRNONAA 42 (L) 07/22/2018 1023   GFRNONAA 35 (L) 01/12/2018 1130   GFRAA 49 (L) 07/22/2018 1023   GFRAA 41 (L) 01/12/2018 1130       Component Value Date/Time   WBC 11.2 (H) 07/22/2018 1023   RBC 2.74 (L) 07/22/2018 1023   HGB 9.0 (L) 07/22/2018 1023   HGB 6.6 (LL) 01/12/2018 1130   HGB 11.9 08/23/2017 0951   HCT 27.8 (L) 07/22/2018 1023   HCT 36.0 08/23/2017 0951   PLT 138 (L) 07/22/2018 1023   PLT 31 (L) 01/12/2018 1130   PLT 401 (H) 08/23/2017 0951   MCV 101.5 (H) 07/22/2018 1023   MCV 92.8 08/23/2017 0951   MCH 32.8 07/22/2018 1023   MCHC 32.4 07/22/2018 1023   RDW 16.2 (H) 07/22/2018 1023   RDW 14.2 08/23/2017 0951   LYMPHSABS 0.6 (L) 07/22/2018 1023   LYMPHSABS 0.8 (L) 08/23/2017 0951   MONOABS 0.9 07/22/2018 1023   MONOABS 1.3 (H) 08/23/2017 0951   EOSABS 0.2 07/22/2018 1023   EOSABS 0.1 08/23/2017 0951   BASOSABS 0.1 07/22/2018 1023   BASOSABS 0.0 08/23/2017 0951   -------------------------------  Imaging from last 24 hours (if applicable):  Radiology interpretation: No results found.

## 2018-07-24 NOTE — Progress Notes (Signed)
Opened in error. Patient seen by Dr. Feng.    

## 2018-07-25 ENCOUNTER — Inpatient Hospital Stay: Payer: 59 | Attending: Hematology

## 2018-07-25 ENCOUNTER — Telehealth: Payer: Self-pay | Admitting: Hematology

## 2018-07-25 ENCOUNTER — Encounter: Payer: Self-pay | Admitting: Hematology

## 2018-07-25 ENCOUNTER — Inpatient Hospital Stay: Payer: 59

## 2018-07-25 ENCOUNTER — Inpatient Hospital Stay (HOSPITAL_BASED_OUTPATIENT_CLINIC_OR_DEPARTMENT_OTHER): Payer: 59 | Admitting: Hematology

## 2018-07-25 VITALS — BP 104/68 | HR 94 | Temp 99.8°F | Resp 18 | Ht 65.0 in | Wt 104.4 lb

## 2018-07-25 VITALS — Temp 99.9°F

## 2018-07-25 DIAGNOSIS — C787 Secondary malignant neoplasm of liver and intrahepatic bile duct: Principal | ICD-10-CM

## 2018-07-25 DIAGNOSIS — D61810 Antineoplastic chemotherapy induced pancytopenia: Secondary | ICD-10-CM | POA: Insufficient documentation

## 2018-07-25 DIAGNOSIS — C259 Malignant neoplasm of pancreas, unspecified: Secondary | ICD-10-CM | POA: Diagnosis not present

## 2018-07-25 DIAGNOSIS — Z5111 Encounter for antineoplastic chemotherapy: Secondary | ICD-10-CM | POA: Diagnosis not present

## 2018-07-25 DIAGNOSIS — Z7189 Other specified counseling: Secondary | ICD-10-CM

## 2018-07-25 DIAGNOSIS — G620 Drug-induced polyneuropathy: Secondary | ICD-10-CM

## 2018-07-25 DIAGNOSIS — Z95828 Presence of other vascular implants and grafts: Secondary | ICD-10-CM

## 2018-07-25 DIAGNOSIS — C251 Malignant neoplasm of body of pancreas: Secondary | ICD-10-CM

## 2018-07-25 DIAGNOSIS — R740 Nonspecific elevation of levels of transaminase and lactic acid dehydrogenase [LDH]: Secondary | ICD-10-CM

## 2018-07-25 DIAGNOSIS — L989 Disorder of the skin and subcutaneous tissue, unspecified: Secondary | ICD-10-CM

## 2018-07-25 DIAGNOSIS — J069 Acute upper respiratory infection, unspecified: Secondary | ICD-10-CM

## 2018-07-25 DIAGNOSIS — N183 Chronic kidney disease, stage 3 (moderate): Secondary | ICD-10-CM

## 2018-07-25 DIAGNOSIS — E876 Hypokalemia: Secondary | ICD-10-CM

## 2018-07-25 LAB — CBC WITH DIFFERENTIAL/PLATELET
ABS IMMATURE GRANULOCYTES: 0.28 10*3/uL — AB (ref 0.00–0.07)
Basophils Absolute: 0.1 10*3/uL (ref 0.0–0.1)
Basophils Relative: 0 %
Eosinophils Absolute: 0.1 10*3/uL (ref 0.0–0.5)
Eosinophils Relative: 0 %
HCT: 24.8 % — ABNORMAL LOW (ref 36.0–46.0)
Hemoglobin: 8.1 g/dL — ABNORMAL LOW (ref 12.0–15.0)
Immature Granulocytes: 2 %
Lymphocytes Relative: 3 %
Lymphs Abs: 0.5 10*3/uL — ABNORMAL LOW (ref 0.7–4.0)
MCH: 32.9 pg (ref 26.0–34.0)
MCHC: 32.7 g/dL (ref 30.0–36.0)
MCV: 100.8 fL — ABNORMAL HIGH (ref 80.0–100.0)
MONOS PCT: 8 %
Monocytes Absolute: 1.3 10*3/uL — ABNORMAL HIGH (ref 0.1–1.0)
Neutro Abs: 14.5 10*3/uL — ABNORMAL HIGH (ref 1.7–7.7)
Neutrophils Relative %: 87 %
Platelets: 125 10*3/uL — ABNORMAL LOW (ref 150–400)
RBC: 2.46 MIL/uL — ABNORMAL LOW (ref 3.87–5.11)
RDW: 16 % — ABNORMAL HIGH (ref 11.5–15.5)
WBC: 16.8 10*3/uL — ABNORMAL HIGH (ref 4.0–10.5)
nRBC: 0 % (ref 0.0–0.2)

## 2018-07-25 LAB — COMPREHENSIVE METABOLIC PANEL
ALK PHOS: 442 U/L — AB (ref 38–126)
ALT: 60 U/L — AB (ref 0–44)
AST: 52 U/L — ABNORMAL HIGH (ref 15–41)
Albumin: 3.1 g/dL — ABNORMAL LOW (ref 3.5–5.0)
Anion gap: 9 (ref 5–15)
BILIRUBIN TOTAL: 0.8 mg/dL (ref 0.3–1.2)
BUN: 14 mg/dL (ref 6–20)
CO2: 27 mmol/L (ref 22–32)
CREATININE: 1.41 mg/dL — AB (ref 0.44–1.00)
Calcium: 9.4 mg/dL (ref 8.9–10.3)
Chloride: 100 mmol/L (ref 98–111)
GFR calc Af Amer: 48 mL/min — ABNORMAL LOW (ref >60)
GFR calc non Af Amer: 42 mL/min — ABNORMAL LOW (ref >60)
Glucose, Bld: 151 mg/dL — ABNORMAL HIGH (ref 70–99)
Potassium: 3.6 mmol/L (ref 3.5–5.1)
Sodium: 136 mmol/L (ref 135–145)
Total Protein: 7.6 g/dL (ref 6.5–8.1)

## 2018-07-25 MED ORDER — SODIUM CHLORIDE 0.9% FLUSH
10.0000 mL | Freq: Once | INTRAVENOUS | Status: AC
  Administered 2018-07-25: 10 mL
  Filled 2018-07-25: qty 10

## 2018-07-25 MED ORDER — SODIUM CHLORIDE 0.9 % IV SOLN
60.0000 mg | Freq: Once | INTRAVENOUS | Status: AC
  Administered 2018-07-25: 60 mg via INTRAVENOUS
  Filled 2018-07-25: qty 13.95

## 2018-07-25 MED ORDER — SODIUM CHLORIDE 0.9% FLUSH
10.0000 mL | INTRAVENOUS | Status: DC | PRN
  Administered 2018-07-25: 10 mL
  Filled 2018-07-25: qty 10

## 2018-07-25 MED ORDER — SODIUM CHLORIDE 0.9 % IV SOLN
Freq: Once | INTRAVENOUS | Status: AC
  Administered 2018-07-25: 11:00:00 via INTRAVENOUS
  Filled 2018-07-25: qty 250

## 2018-07-25 MED ORDER — PROCHLORPERAZINE MALEATE 10 MG PO TABS
ORAL_TABLET | ORAL | Status: AC
  Filled 2018-07-25: qty 1

## 2018-07-25 MED ORDER — HEPARIN SOD (PORK) LOCK FLUSH 100 UNIT/ML IV SOLN
500.0000 [IU] | Freq: Once | INTRAVENOUS | Status: AC | PRN
  Administered 2018-07-25: 500 [IU]
  Filled 2018-07-25: qty 5

## 2018-07-25 MED ORDER — PROCHLORPERAZINE MALEATE 10 MG PO TABS
10.0000 mg | ORAL_TABLET | Freq: Once | ORAL | Status: AC
  Administered 2018-07-25: 10 mg via ORAL

## 2018-07-25 NOTE — Patient Instructions (Signed)
[facility] Discharge Instructions for Patients Receiving Chemotherapy  Today you received the following chemotherapy agents Leucovorin   To help prevent nausea and vomiting after your treatment, we encourage you to take your nausea medication as prescribed.   If you develop nausea and vomiting that is not controlled by your nausea medication, call the clinic.   BELOW ARE SYMPTOMS THAT SHOULD BE REPORTED IMMEDIATELY:  *FEVER GREATER THAN 100.5 F  *CHILLS WITH OR WITHOUT FEVER  NAUSEA AND VOMITING THAT IS NOT CONTROLLED WITH YOUR NAUSEA MEDICATION  *UNUSUAL SHORTNESS OF BREATH  *UNUSUAL BRUISING OR BLEEDING  TENDERNESS IN MOUTH AND THROAT WITH OR WITHOUT PRESENCE OF ULCERS  *URINARY PROBLEMS  *BOWEL PROBLEMS  UNUSUAL RASH Items with * indicate a potential emergency and should be followed up as soon as possible.  Feel free to call the clinic should you have any questions or concerns. The clinic phone number is [phone].  Please show the [place] at check-in to the Emergency Department and triage nurse.

## 2018-07-25 NOTE — Telephone Encounter (Signed)
Cancelled 12/4 pump appointment per 12/2 los. Spoke with infusion - patient aware. Central radiology will call re pet scan once order is entered. Message to YF to enter order for pet. Other appointments remain the same.

## 2018-07-25 NOTE — Progress Notes (Signed)
[place]  [phone] [phone]  Clinic Follow up Note   Patient Care Team: [name], MD as PCP - General (Family Medicine) [name], MD as Consulting Physician (Otolaryngology) 07/25/2018  Chief Complaint: F/u on pancreatic cancer  SUMMARY OF ONCOLOGIC HISTORY: Oncology History   Cancer Staging Pancreatic cancer [facility]) Staging form: Exocrine Pancreas, AJCC 8th Edition - Clinical stage from 08/06/2017: Stage IV (cTX, cN0, pM1) - Signed by [name], MD on 08/12/2017       Pancreatic cancer ([place])   07/23/2017 Imaging    US abdomen limited RUQ 07/23/17 IMPRESSION: 1. Cholelithiasis.  No secondary signs of acute cholecystitis. 2. Multiple solid liver masses measuring up to 6.5 cm in the left lobe of the liver, evaluation for metastatic disease is recommended. These results will be called to the ordering clinician or representative by the Radiologist Assistant, and communication documented in the PACS or zVision Dashboard.    07/23/2017 Imaging    CT Abdomen W Contrast 07/23/17 IMPRESSION: 1. Widespread metastatic disease throughout the liver. No clear primary malignancy identified in the abdomen. The pelvis was not imaged. Tissue sampling recommended. 2. Probable adenopathy superior to the pancreatic tail. No evidence of pancreatic mass. 3. Suspected incidental hemangioma inferiorly in the right hepatic lobe. 4. Nonspecific nodularity in the breasts. The patient has undergone recent (03/25/2017 and 04/01/2017) mammography and ultrasound.    07/29/2017 Initial Diagnosis    Metastasis to liver of unknown origin ([place])    07/29/2017 PET scan    PET 07/29/17  IMPRESSION: 1. Numerous bulky liver masses are hypermetabolic compatible with malignancy. 2. Accentuated activity within or along the pancreatic tail, likely represent a primary pancreatic tumor. Consider pancreatic protocol MRI to further work this up. 3. The  peripancreatic lymph node shown above the pancreatic tail is mildly hypermetabolic favoring malignancy. 4.  Prominent stool throughout the colon favors constipation. 5. 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    08/05/2017 Pathology Results    Liver Biopsy  Diagnosis 08/05/17 Liver, needle/core biopsy - CARCINOMA. - SEE COMMENT. Microscopic Comment The malignant cells are positive for cytokeratin 7. They are negative for arginase, CDX2, cytokeratin 20, estrogen receptor, GATA-3, GCDFP, Glypican 3, Hep Par 1, Napsin A, and TTF-1. This immunohistochemical is nonspecific. Possibly primary sources include pancreatobiliary and upper gastrointestinal. Radiologic correlation is necessary. Of note, organ specific markers (GATA-3, GCDFP-breast, TTF-1, Napsin A-lung, and CDX2-colon) are negative. (JBK:ecj 08/09/2017)    08/21/2017 -  Chemotherapy    FOLFIRINOX every 2 weeks starting 08/21/17      10/14/2017 Imaging    CT CAP WO Contrast 10/14/17 IMPRESSION: Evidence of known numerous liver metastases without significant interval change. No other evidence of metastatic disease within the chest, abdomen or pelvis. Cholelithiasis. Tiny pericardial effusion.    12/02/2017 Genetic Testing    Negative for pathogenic mutation.  The genes analyzed were the 83 genes on [facility]'s Multi-Cancer panel (ALK, APC, ATM, AXIN2, BAP1, BARD1, BLM, BMPR1A, BRCA1, BRCA2, BRIP1, CASR, CDC73, CDH1, CDK4, CDKN1B, CDKN1C, CDKN2A, CEBPA, CHEK2, CTNNA1, DICER1, DIS3L2, EGFR, EPCAM, FH, FLCN, GATA2, GPC3, GREM1, HOXB13, HRAS, KIT, MAX, MEN1, MET, MITF, MLH1, MSH2, MSH3, MSH6, MUTYH, NBN, NF1, NF2, NTHL1, PALB2, PDGFRA, PHOX2B, PMS2, POLD1, POLE, POT1, PRKAR1A, PTCH1, PTEN, RAD50, RAD51C, RAD51D, RB1, RECQL4, RET, RUNX1, SDHA, SDHAF2, SDHB, SDHC, SDHD, SMAD4, SMARCA4, SMARCB1, SMARCE1, STK11, SUFU, TERC, TERT, TMEM127, TP53, TSC1, TSC2, VHL, WRN, WT1).    12/17/2017 PET scan    IMPRESSION: 1. Although the liver metastases  are  still visible on the CT images, they are significantly smaller and retain no abnormal metabolic activity, consistent with response to therapy. 2. No abnormal activity within the pancreas. 3. No disease progression identified. 4. Cholelithiasis.    02/21/2018 PET scan    02/21/2018 PET Scan  IMPRESSION: 1. There are 2 new small nodules identified within the right lung which measure up to 5 mm. These are too small to reliably characterize by PET-CT, but warrant close interval follow-up. 2. Again noted are multifocal liver metastasis. The target lesion within the left lobe is slightly decreased in size when compared with the previous exam. Similar to previous exam there is no abnormal hypermetabolism above background liver activity identified within the liver lesions. 3. Gallstones.    02/22/2018 -  Chemotherapy    5-FU pump infusion and liposomal irinotecan, every 2 weeks.  First cycle dose reduced due to cytopenia in the travel.    05/17/2018 Imaging    05/17/2018 PET Scan IMPRESSION: 1. Mixed response. The 2 small right lung nodules have decreased in size in the interval. Single focus of increased uptake within the liver is new from 02/21/2018 and concerning for recurrent metabolically active liver metastasis. 2. Splenomegaly.  New from previous exam. 3. Diffusely increased bone marrow activity, likely reflecting treatment related changes.     Pancreatic cancer metastasized to liver ([place])   08/11/2017 Initial Diagnosis    Pancreatic cancer metastasized to liver [facility])    CURRENT THERAPY  second line 5-FU pump infusion and liposomal irinotecan every 2 weeks, started on 02/22/2018, dose reduction due to pancytopenia and neuropathy   INTERVAL HISTORY: Morgan Dawson is a 57 y.o. female who is here for follow-up. She is here with her husband. She recently saw PA Sandi Mealy for a cough. She is reovering well, but still has intermittent productive cough with green sputum. Her soar  throat has resolved. She denies myalgia. Her rib pain has improved. Her appetite was reduced during the cold, and has therefore lost weight. She feels fatigued, but related that to having company in the house. She says that she noticed small bumps on her right leg that is itchy and mildly tender. Her other skin lesions improved.   Pertinent positives and negatives of review of systems are listed and detailed within the above HPI.   REVIEW OF SYSTEMS:   Constitutional: Denies fevers, chills or abnormal weight loss Eyes: Denies blurriness of vision Ears, nose, mouth, throat, and face: Denies mucositis or sore throat Respiratory: Denies dyspnea or wheezes (+) intermittent productive cough  Cardiovascular: Denies palpitation, chest discomfort or lower extremity swelling Gastrointestinal:  Denies nausea, heartburn or change in bowel habits Skin: Denies abnormal skin rashes (+) improving skin rash Lymphatics: Denies new lymphadenopathy or easy bruising Neurological:Denies numbness, tingling or new weaknesses Behavioral/Psych: Mood is stable, no new changes  All other systems were reviewed with the patient and are negative.  MEDICAL HISTORY:  Past Medical History:  Diagnosis Date  . Genetic testing 12/02/2017   Multi-Cancer panel (83 genes) @ [facility] - No pathogenic mutations detected  . Meniere disease 2016  . Pancreatitis     SURGICAL HISTORY: Past Surgical History:  Procedure Laterality Date  . CERVICAL ABLATION    . ENDOMETRIAL ABLATION  2011  . IR FLUORO GUIDE PORT INSERTION RIGHT  08/12/2017  . IR US GUIDE VASC ACCESS RIGHT  08/12/2017  . [place]    I have reviewed the social history and family history with the patient and  they are unchanged from previous note.  ALLERGIES:  has No Known Allergies.  MEDICATIONS:  Current Outpatient Medications  Medication Sig Dispense Refill  . albuterol (PROVENTIL HFA;VENTOLIN HFA) 108 (90 Base) MCG/ACT inhaler Inhale 2  puffs into the lungs every 6 (six) hours as needed for wheezing or shortness of breath. 1 Inhaler 2  . azithromycin (ZITHROMAX) 250 MG tablet 2 tab day #1, then 1 daily until completed 6 each 0  . gabapentin (NEURONTIN) 100 MG capsule Take 1 capsule (100 mg total) by mouth 3 (three) times daily. 90 capsule 2  . guaiFENesin-codeine 100-10 MG/5ML syrup Take 5 mLs by mouth every 6 (six) hours as needed for cough. 180 mL 0  . loperamide (IMODIUM) 2 MG capsule Take 1 capsule (2 mg total) by mouth as needed for diarrhea or loose stools. 30 capsule 0  . loratadine (CLARITIN) 10 MG tablet Take 10 mg by mouth daily as needed for allergies.    . magic mouthwash w/lidocaine SOLN Take 10 mLs by mouth 3 (three) times daily as needed for mouth pain. Swish and spit 10 ML by mouth 3 times daily as needed for mouth pain. 240 mL 0  . meclizine (ANTIVERT) 25 MG tablet Take 25 mg by mouth 3 (three) times daily as needed for dizziness.    . ondansetron (ZOFRAN-ODT) 8 MG disintegrating tablet TAKE 1 TABLET BY MOUTH EVERY 8 HOURS AS NEEDED FOR NAUSE OR VOMITING 40 tablet 0  . potassium chloride (K-DUR) 10 MEQ tablet Take 1 tablet (10 mEq total) by mouth 4 (four) times daily. 360 tablet 2  . prochlorperazine (COMPAZINE) 10 MG tablet Take 1 tablet (10 mg total) by mouth every 6 (six) hours as needed for nausea or vomiting. 40 tablet 2  . traMADol (ULTRAM) 50 MG tablet Take 1 tablet (50 mg total) by mouth every 6 (six) hours as needed. 30 tablet 0   No current facility-administered medications for this visit.     PHYSICAL EXAMINATION: ECOG PERFORMANCE STATUS: 1 - Symptomatic but completely ambulatory  Vitals:   07/25/18 0953  BP: 104/68  Pulse: 94  Resp: 18  Temp: 99.8 F (37.7 C)  SpO2: 99%   Filed Weights   07/25/18 0953  Weight: 104 lb 6.4 oz (47.4 kg)    GENERAL:alert, no distress and comfortable SKIN: skin color, texture, turgor are normal, no rashes or significant lesions (+) skin rash improving, new  one on right leg EYES: normal, Conjunctiva are pink and non-injected, sclera clear OROPHARYNX:no exudate, no erythema and lips, buccal mucosa, and tongue normal.  (+) runny nose (+) nasal voice NECK: supple, thyroid normal size, non-tender, without nodularity LYMPH:  no palpable lymphadenopathy in the cervical, axillary or inguinal LUNGS: clear to auscultation and percussion with normal breathing effort HEART: regular rate & rhythm and no murmurs and no lower extremity edema ABDOMEN:abdomen soft, non-tender and normal bowel sounds Musculoskeletal:no cyanosis of digits and no clubbing  NEURO: alert & oriented x 3 with fluent speech, no focal motor/sensory deficits  LABORATORY DATA:  I have reviewed the data as listed CBC Latest Ref Rng & Units 07/25/2018 07/22/2018 07/11/2018  WBC 4.0 - 10.5 K/uL 16.8(H) 11.2(H) 12.3(H)  Hemoglobin 12.0 - 15.0 g/dL 8.1(L) 9.0(L) 8.7(L)  Hematocrit 36.0 - 46.0 % 24.8(L) 27.8(L) 27.3(L)  Platelets 150 - 400 K/uL 125(L) 138(L) 146(L)     CMP Latest Ref Rng & Units 07/25/2018 07/22/2018 07/11/2018  Glucose 70 - 99 mg/dL 151(H) 139(H) 109(H)  BUN 6 - 20 mg/dL 14  13 17  Creatinine 0.44 - 1.00 mg/dL 1.41(H) 1.40(H) 1.27(H)  Sodium 135 - 145 mmol/L 136 140 140  Potassium 3.5 - 5.1 mmol/L 3.6 3.4(L) 3.7  Chloride 98 - 111 mmol/L 100 102 105  CO2 22 - 32 mmol/L 27 29 27   Calcium 8.9 - 10.3 mg/dL 9.4 9.9 9.6  Total Protein 6.5 - 8.1 g/dL 7.6 8.2(H) 7.8  Total Bilirubin 0.3 - 1.2 mg/dL 0.8 0.5 0.4  Alkaline Phos 38 - 126 U/L 442(H) 536(H) 412(H)  AST 15 - 41 U/L 52(H) 65(H) 69(H)  ALT 0 - 44 U/L 60(H) 76(H) 75(H)    RADIOGRAPHIC STUDIES: I have personally reviewed the radiological images as listed and agreed with the findings in the report. No results found.   ASSESSMENT & PLAN:  JAMALA KOHEN is a 57 y.o. female with history of  1. 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 She was  treated with first line chemo FOLFIRINOX with dose reduction. Due to neuropathy, chemo changed to second line 5-FU pump infusion and liposomal irinotecan every 2 weeks with dose reduction due to cytopenia. -she has been tolerating chemo well  -Labs reviewed, CBC showed Hg 8.1 WBC 16.8 PLT 125K RDW 16.0 Neutro abd 14.5. -She is recovering from respiratory virus infection last week and reports feeling slightly more fatigued to due to the busy holiday schedule. She says that her energy is about the same as it was 2 weeks ago.  -She has low-grade fever today, but overall feels better than last week.  We discussed holding chemo versus dose reduction, and decided to irinotecan alone today.  -She knows to call if her symptoms worsen. I advised her to call and update me with how well she is doing. She had one more day to complete antibiotic azithromycin course. -She is not very symptomatic from her anemia.  I will delay blood transfusion for next visit if needed. Will order cross matching next visit.  -f/u in 2 weeks. -Restaging PET scan in January   2. Weight loss -Secondary to underlying metastatic cancer and chemotherapy.   -continue taking nutritional supplement.  F/u with dietician -weight stable overall   3. Hypercalcemia -Due to underlying malignancy -resolved now with cancer treatment   4. Transaminitis -Due to underlying malignancy and chemo  -Improving, will continue monitoring  5. Pancytopenia, secondary to chemo -Required chemo dose reduction -Labs reviewed, Hg 8.1 PLT 125K WBC 16.8.  -She is not very somatic from anemia, will wait and see if she needs blood transfusion on next visit  6. Hypokalemia, CKD Stage III -She is on KCL supplements -renal function stable overall   7. Peripheral neuropathy, grade 2 -She is on Neurontin 200 at night and B complex  8. Goal of care discussion -She is full code  9. Subcutaneous skin lesion -Improving overall, but there is a new right  leg lesion that appeared 2 weeks ago. She says that the lesion is itchy and mildly tender. -I will monitor, and consider biopsy if this doesn't improve  10. URI  -She developed congestion and cough last week, improving with azithromycin and supportive care, lung exam was unremarkable today  -will monitor closely   Plan -Labs reviewed, will proceed with irinotecan only today. No 5-fu pump due to her recent URI. -f/u in 2 weeks  -Repeat PET scan before her visit on 1/13  -She will finish azithromycin oral and call to update me with how well she is  doing later this week.    No problem-specific Assessment & Plan notes found for this encounter.   Orders Placed This Encounter  Procedures  . Sample to Blood Bank    Standing Status:   Future    Standing Expiration Date:   07/26/2019    Scheduling Instructions:     Next blood draw   All questions were answered. The patient knows to call the clinic with any problems, questions or concerns. No barriers to learning was detected. I spent 20 minutes counseling the patient face to face. The total time spent in the appointment was 25 minutes and more than 50% was on counseling and review of test results  I, Noor Dweik am acting as scribe for Dr. [name].  I have reviewed the above documentation for accuracy and completeness, and I agree with the above.      [name], MD 07/25/2018

## 2018-07-26 ENCOUNTER — Other Ambulatory Visit: Payer: Self-pay | Admitting: Medical

## 2018-07-26 ENCOUNTER — Telehealth: Payer: Self-pay | Admitting: *Deleted

## 2018-07-26 LAB — CANCER ANTIGEN 19-9: CAN 19-9: 32 U/mL (ref 0–35)

## 2018-07-26 MED ORDER — DOXYCYCLINE HYCLATE 100 MG PO TABS: 100.0000 mg | ORAL_TABLET | Freq: Two times a day (BID) | ORAL | 0 refills | Status: DC

## 2018-07-26 NOTE — Telephone Encounter (Signed)
Received vm call from pt stating that she has had a chest cold & saw Dr Burr Medico yest & received abbreviated chemo treatment yest.  She had seen Sandi Mealy PA last Friday & was placed on Z-pack.  She reports temp was 99 yest & 102 last night.  She took 2 tylenol & temp broke.  Temp @ 2 am was 96.9 & this am temp was 99.9 & then 1 & 1/2 hr later it was 100.7.  Discussed with Sandi Mealy & he suggested she stop z-pack & he sent in new ATB-Doxycline.  Informed Thayer Headings to start new ATB & cont to watch temp & to call us if any changes.  She denies any other symptoms.She reports feeling better from chest cold.  She states that she already took last Z-pack.  Message also routed to Dr Burr Medico.

## 2018-07-26 NOTE — Telephone Encounter (Signed)
Pt called back & states temp has not been above 100.7 & she hasn't taken any tylenol.  Last temp was 99.4.  She will continue to watch & call if symptoms worsen.

## 2018-08-01 ENCOUNTER — Telehealth: Payer: Self-pay

## 2018-08-01 NOTE — Telephone Encounter (Signed)
Left voice message for patient to follow up on how she is feeling, also if her fever has resolved, requested she call me back and let me know.

## 2018-08-01 NOTE — Telephone Encounter (Signed)
Spoke with patient regarding her fever, she states that today it is 99.1, Saturday in the morning 99.6, evening 101.2, Sunday, 98.3 morning, 100.1 evening. Dr. Burr Medico aware.

## 2018-08-07 NOTE — Progress Notes (Signed)
Morgan Dawson   Telephone:(336) [phone] [phone]) [phone]   Clinic Follow up Note   Patient Care Team: [name], MD as PCP - General (Family Medicine) [name], MD as Consulting Physician (Otolaryngology) 08/08/2018  CHIEF COMPLAINT: F/u on pancreatic cancer   SUMMARY OF ONCOLOGIC HISTORY: Oncology History   Cancer Staging Pancreatic cancer [facility]) Staging form: Exocrine Pancreas, AJCC 8th Edition - Clinical stage from 08/06/2017: Stage IV (cTX, cN0, pM1) - Signed by [name], MD on 08/12/2017       Pancreatic cancer ([place])   07/23/2017 Imaging    US abdomen limited RUQ 07/23/17 IMPRESSION: 1. Cholelithiasis.  No secondary signs of acute cholecystitis. 2. Multiple solid liver masses measuring up to 6.5 cm in the left lobe of the liver, evaluation for metastatic disease is recommended. These results will be called to the ordering clinician or representative by the Radiologist Assistant, and communication documented in the PACS or zVision Dashboard.    07/23/2017 Imaging    CT Abdomen W Contrast 07/23/17 IMPRESSION: 1. Widespread metastatic disease throughout the liver. No clear primary malignancy identified in the abdomen. The pelvis was not imaged. Tissue sampling recommended. 2. Probable adenopathy superior to the pancreatic tail. No evidence of pancreatic mass. 3. Suspected incidental hemangioma inferiorly in the right hepatic lobe. 4. Nonspecific nodularity in the breasts. The patient has undergone recent (03/25/2017 and 04/01/2017) mammography and ultrasound.    07/29/2017 Initial Diagnosis    Metastasis to liver of unknown origin ([place])    07/29/2017 PET scan    PET 07/29/17  IMPRESSION: 1. Numerous bulky liver masses are hypermetabolic compatible with malignancy. 2. Accentuated activity within or along the pancreatic tail, likely represent a primary pancreatic tumor. Consider pancreatic protocol MRI to further work this up. 3. The  peripancreatic lymph node shown above the pancreatic tail is mildly hypermetabolic favoring malignancy. 4.  Prominent stool throughout the colon favors constipation. 5. 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    08/05/2017 Pathology Results    Liver Biopsy  Diagnosis 08/05/17 Liver, needle/core biopsy - CARCINOMA. - SEE COMMENT. Microscopic Comment The malignant cells are positive for cytokeratin 7. They are negative for arginase, CDX2, cytokeratin 20, estrogen receptor, GATA-3, GCDFP, Glypican 3, Hep Par 1, Napsin A, and TTF-1. This immunohistochemical is nonspecific. Possibly primary sources include pancreatobiliary and upper gastrointestinal. Radiologic correlation is necessary. Of note, organ specific markers (GATA-3, GCDFP-breast, TTF-1, Napsin A-lung, and CDX2-colon) are negative. (JBK:ecj 08/09/2017)    08/21/2017 -  Chemotherapy    FOLFIRINOX every 2 weeks starting 08/21/17      10/14/2017 Imaging    CT CAP WO Contrast 10/14/17 IMPRESSION: Evidence of known numerous liver metastases without significant interval change. No other evidence of metastatic disease within the chest, abdomen or pelvis. Cholelithiasis. Tiny pericardial effusion.    12/02/2017 Genetic Testing    Negative for pathogenic mutation.  The genes analyzed were the 83 genes on [facility]'s Multi-Cancer panel (ALK, APC, ATM, AXIN2, BAP1, BARD1, BLM, BMPR1A, BRCA1, BRCA2, BRIP1, CASR, CDC73, CDH1, CDK4, CDKN1B, CDKN1C, CDKN2A, CEBPA, CHEK2, CTNNA1, DICER1, DIS3L2, EGFR, EPCAM, FH, FLCN, GATA2, GPC3, GREM1, HOXB13, HRAS, KIT, MAX, MEN1, MET, MITF, MLH1, MSH2, MSH3, MSH6, MUTYH, NBN, NF1, NF2, NTHL1, PALB2, PDGFRA, PHOX2B, PMS2, POLD1, POLE, POT1, PRKAR1A, PTCH1, PTEN, RAD50, RAD51C, RAD51D, RB1, RECQL4, RET, RUNX1, SDHA, SDHAF2, SDHB, SDHC, SDHD, SMAD4, SMARCA4, SMARCB1, SMARCE1, STK11, SUFU, TERC, TERT, TMEM127, TP53, TSC1, TSC2, VHL, WRN, WT1).    12/17/2017 PET scan    IMPRESSION: 1. Although the liver  metastases  are still visible on the CT images, they are significantly smaller and retain no abnormal metabolic activity, consistent with response to therapy. 2. No abnormal activity within the pancreas. 3. No disease progression identified. 4. Cholelithiasis.    02/21/2018 PET scan    02/21/2018 PET Scan  IMPRESSION: 1. There are 2 new small nodules identified within the right lung which measure up to 5 mm. These are too small to reliably characterize by PET-CT, but warrant close interval follow-up. 2. Again noted are multifocal liver metastasis. The target lesion within the left lobe is slightly decreased in size when compared with the previous exam. Similar to previous exam there is no abnormal hypermetabolism above background liver activity identified within the liver lesions. 3. Gallstones.    02/22/2018 -  Chemotherapy    5-FU pump infusion and liposomal irinotecan, every 2 weeks.  First cycle dose reduced due to cytopenia in the travel.    05/17/2018 Imaging    05/17/2018 PET Scan IMPRESSION: 1. Mixed response. The 2 small right lung nodules have decreased in size in the interval. Single focus of increased uptake within the liver is new from 02/21/2018 and concerning for recurrent metabolically active liver metastasis. 2. Splenomegaly.  New from previous exam. 3. Diffusely increased bone marrow activity, likely reflecting treatment related changes.     Pancreatic cancer metastasized to liver ([place])   08/11/2017 Initial Diagnosis    Pancreatic cancer metastasized to liver [facility])     CURRENT THERAPY second line 5-FU pump infusion and liposomal irinotecan every 2 weeks, started on 02/22/2018, dose reduction due to pancytopenia and neuropathy   INTERVAL HISTORY: Morgan Dawson is a 57 y.o. female who is here for follow-up. Today, she is here with her husband. She wears hearing aids due to Menire disease. She is recovering from her recent URTI. Her fever resolved last Tuesday. She feels  tired. She is still able to work around the house. She still finds it hard to motivate herself to go upstairs. She denies CP. Her subcutaneous lesions are improving and almost resolved now, no new subcutaneous lesions. She says that her lesions never itched.   Pertinent positives and negatives of review of systems are listed and detailed within the above HPI.  REVIEW OF SYSTEMS:   Constitutional: Denies fevers, chills or abnormal weight loss (+) fatigue  Eyes: Denies blurriness of vision Ears, nose, mouth, throat, and face: Denies mucositis or sore throat (+) wears hearing aids  Respiratory: Denies cough, dyspnea or wheezes Cardiovascular: Denies palpitation, chest discomfort or lower extremity swelling Gastrointestinal:  Denies nausea, heartburn or change in bowel habits Skin: Denies abnormal skin rashes (+) subcutaneous lesions improving, almost resolved  Lymphatics: Denies new lymphadenopathy or easy bruising Neurological:Denies numbness, tingling or new weaknesses Behavioral/Psych: Mood is stable, no new changes  All other systems were reviewed with the patient and are negative.  MEDICAL HISTORY:  Past Medical History:  Diagnosis Date  . Genetic testing 12/02/2017   Multi-Cancer panel (83 genes) @ [facility] - No pathogenic mutations detected  . Meniere disease 2016  . Pancreatitis     SURGICAL HISTORY: Past Surgical History:  Procedure Laterality Date  . CERVICAL ABLATION    . ENDOMETRIAL ABLATION  2011  . IR FLUORO GUIDE PORT INSERTION RIGHT  08/12/2017  . IR US GUIDE VASC ACCESS RIGHT  08/12/2017  . [place]    I have reviewed the social history and family history with the patient and they are unchanged from previous  note.  ALLERGIES:  has No Known Allergies.  MEDICATIONS:  Current Outpatient Medications  Medication Sig Dispense Refill  . gabapentin (NEURONTIN) 100 MG capsule Take 1 capsule (100 mg total) by mouth 3 (three) times daily. 90 capsule 2  .  guaiFENesin-codeine 100-10 MG/5ML syrup Take 5 mLs by mouth every 6 (six) hours as needed for cough. 180 mL 0  . loperamide (IMODIUM) 2 MG capsule Take 1 capsule (2 mg total) by mouth as needed for diarrhea or loose stools. 30 capsule 0  . loratadine (CLARITIN) 10 MG tablet Take 10 mg by mouth daily as needed for allergies.    . magic mouthwash w/lidocaine SOLN Take 10 mLs by mouth 3 (three) times daily as needed for mouth pain. Swish and spit 10 ML by mouth 3 times daily as needed for mouth pain. 240 mL 0  . meclizine (ANTIVERT) 25 MG tablet Take 25 mg by mouth 3 (three) times daily as needed for dizziness.    . ondansetron (ZOFRAN-ODT) 8 MG disintegrating tablet TAKE 1 TABLET BY MOUTH EVERY 8 HOURS AS NEEDED FOR NAUSE OR VOMITING 40 tablet 0  . potassium chloride (K-DUR) 10 MEQ tablet Take 1 tablet (10 mEq total) by mouth 4 (four) times daily. 360 tablet 2  . prochlorperazine (COMPAZINE) 10 MG tablet Take 1 tablet (10 mg total) by mouth every 6 (six) hours as needed for nausea or vomiting. 40 tablet 2  . traMADol (ULTRAM) 50 MG tablet Take 1 tablet (50 mg total) by mouth every 6 (six) hours as needed. 30 tablet 0  . albuterol (PROVENTIL HFA;VENTOLIN HFA) 108 (90 Base) MCG/ACT inhaler Inhale 2 puffs into the lungs every 6 (six) hours as needed for wheezing or shortness of breath. (Patient not taking: Reported on 08/08/2018) 1 Inhaler 2  . doxycycline (VIBRA-TABS) 100 MG tablet Take 1 tablet (100 mg total) by mouth 2 (two) times daily. (Patient not taking: Reported on 08/08/2018) 14 tablet 0   No current facility-administered medications for this visit.     PHYSICAL EXAMINATION: ECOG PERFORMANCE STATUS: 2 - Symptomatic, <50% confined to bed  Vitals:   08/08/18 1021  BP: 102/70  Pulse: 88  Resp: 18  Temp: 98.4 F (36.9 C)  SpO2: 100%   Filed Weights   08/08/18 1021  Weight: 102 lb 6.4 oz (46.4 kg)    GENERAL:alert, no distress and comfortable SKIN: skin color, texture, turgor are  normal, no rashes or significant lesions EYES: normal, Conjunctiva are pink and non-injected, sclera clear OROPHARYNX:no exudate, no erythema and lips, buccal mucosa, and tongue normal  NECK: supple, thyroid normal size, non-tender, without nodularity LYMPH:  no palpable lymphadenopathy in the cervical, axillary or inguinal LUNGS: clear to auscultation and percussion with normal breathing effort HEART: regular rate & rhythm and no murmurs and no lower extremity edema ABDOMEN:abdomen soft,  normal bowel sounds (+) mild tenderness at RUQ (+) liver edge palpable at costal margin  Musculoskeletal:no cyanosis of digits and no clubbing (+) subcutaneous lesion stable  NEURO: alert & oriented x 3 with fluent speech, no focal motor/sensory deficits  LABORATORY DATA:  I have reviewed the data as listed CBC Latest Ref Rng & Units 08/08/2018 07/25/2018 07/22/2018  WBC 4.0 - 10.5 K/uL 5.5 16.8(H) 11.2(H)  Hemoglobin 12.0 - 15.0 g/dL 6.7(LL) 8.1(L) 9.0(L)  Hematocrit 36.0 - 46.0 % 21.0(L) 24.8(L) 27.8(L)  Platelets 150 - 400 K/uL 274 125(L) 138(L)     CMP Latest Ref Rng & Units 08/08/2018 07/25/2018 07/22/2018  Glucose 70 -  99 mg/dL 128(H) 151(H) 139(H)  BUN 6 - 20 mg/dL 14 14 13   Creatinine 0.44 - 1.00 mg/dL 1.14(H) 1.41(H) 1.40(H)  Sodium 135 - 145 mmol/L 138 136 140  Potassium 3.5 - 5.1 mmol/L 3.9 3.6 3.4(L)  Chloride 98 - 111 mmol/L 101 100 102  CO2 22 - 32 mmol/L 27 27 29   Calcium 8.9 - 10.3 mg/dL 9.2 9.4 9.9  Total Protein 6.5 - 8.1 g/dL 7.6 7.6 8.2(H)  Total Bilirubin 0.3 - 1.2 mg/dL 0.6 0.8 0.5  Alkaline Phos 38 - 126 U/L 551(H) 442(H) 536(H)  AST 15 - 41 U/L 57(H) 52(H) 65(H)  ALT 0 - 44 U/L 58(H) 60(H) 76(H)      RADIOGRAPHIC STUDIES: I have personally reviewed the radiological images as listed and agreed with the findings in the report. No results found.   ASSESSMENT & PLAN:  Morgan Dawson is a 57 y.o. female with history of  1. 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 Treated with chemo. She was treated with first line chemo FOLFIRINOX with dose reduction. Due to neuropathy and cytopenia, chemo changed to second line 5-FU pump infusion and liposomal irinotecan every 2 weeks with dose reduction due to cytopenia. Tolerating well. -Labs reviewed, CBC showed Hg 6.7 MCV 102.9. CMP showed mild stable transaminitis, normal bilirubin -Due to her recent febrile illness, profound weakness from anemia, will proceed to chemotherapy with liposomal irinotecan along today, and give 2 units of blood transfusion -If she recovers well, will resume 5-FU pump infusion and liposomal irinotecan in 2 weeks -f/u with me on 09/05/2018 with restaging scan    2. Weight loss -Due to chemo and underlying metastatic cancer. -Continue nutritional supplement and f/u with dietician. -weight today 102 Ibs, 2 Ib less than last visit  3.Transaminitis -Due to underlying chemo and malignancy -overall mild and stable, continue monitoring  4. Pancytopenia, secondary to chemo -Required chemo dose reduction. -Labs reviewed, CBC showed Hg 6.7 WBC 5.5K RBC 2.04 PLT 274K.  -She will receive 2 units of red blood cell transfusion today  5. Hypokalemia, CKD Stage III - She is currently on KCL supplements -Creatinine 1.14, improved from 2 weeks ago  6. Peripheral neuropathy, grade 2 -Currently on Neurontin 200 mg at night and B complexes. Continue.  7. Goal of care discussion -She is full code now  8. Subcutaneous skin lesions -resolved now   9. Febrile illness  -She developed fever and mild cough 3 weeks ago, resolved one week ago, she received 2 courses of oral antibiotics -resolved now   Plan  -Labs reviewed, due to her recent febrile illness, fatigue, will proceed with liposomal irinotecan alone today -will give blood transfusion today -She will return in 2 weeks, if recovers well, will resume 5-FU pump and liposomal  irinotecan infusion -I will see her back in 4 weeks with restaging PET scan, due to her CKD, will hold CT contrast    No problem-specific Assessment & Plan notes found for this encounter.   Orders Placed This Encounter  Procedures  . NM PET Image Restag (PS) Skull Base To Thigh    Standing Status:   Future    Standing Expiration Date:   08/09/2019    Order Specific Question:   If indicated for the ordered procedure, I authorize the administration of a radiopharmaceutical per Radiology protocol    Answer:   Yes    Order Specific Question:   Is the patient pregnant?  Answer:   No    Order Specific Question:   Preferred imaging location?    Answer:   [facility]    Order Specific Question:   Radiology Contrast Protocol - do NOT remove file path    Answer:   [email or web address]  . Prepare RBC    Standing Status:   Standing    Number of Occurrences:   1    Order Specific Question:   # of Units    Answer:   2 units    Order Specific Question:   Transfusion Indications    Answer:   Symptomatic Anemia    Order Specific Question:   If emergent release call blood bank    Answer:   Not emergent release   All questions were answered. The patient knows to call the clinic with any problems, questions or concerns. No barriers to learning was detected. I spent 20 minutes counseling the patient face to face. The total time spent in the appointment was 25 minutes and more than 50% was on counseling and review of test results  I, Noor Dweik am acting as scribe for Dr. [name].  I have reviewed the above documentation for accuracy and completeness, and I agree with the above.     [name], MD 08/08/2018

## 2018-08-08 ENCOUNTER — Inpatient Hospital Stay: Payer: 59

## 2018-08-08 ENCOUNTER — Other Ambulatory Visit: Payer: Self-pay

## 2018-08-08 ENCOUNTER — Inpatient Hospital Stay (HOSPITAL_BASED_OUTPATIENT_CLINIC_OR_DEPARTMENT_OTHER): Payer: 59 | Admitting: Hematology

## 2018-08-08 VITALS — BP 102/70 | HR 88 | Temp 98.4°F | Resp 18 | Ht 65.0 in | Wt 102.4 lb

## 2018-08-08 VITALS — BP 105/73 | HR 71 | Temp 98.5°F | Resp 18

## 2018-08-08 DIAGNOSIS — E876 Hypokalemia: Secondary | ICD-10-CM

## 2018-08-08 DIAGNOSIS — L989 Disorder of the skin and subcutaneous tissue, unspecified: Secondary | ICD-10-CM

## 2018-08-08 DIAGNOSIS — G620 Drug-induced polyneuropathy: Secondary | ICD-10-CM

## 2018-08-08 DIAGNOSIS — Z5111 Encounter for antineoplastic chemotherapy: Secondary | ICD-10-CM | POA: Diagnosis not present

## 2018-08-08 DIAGNOSIS — C259 Malignant neoplasm of pancreas, unspecified: Secondary | ICD-10-CM

## 2018-08-08 DIAGNOSIS — T451X5A Adverse effect of antineoplastic and immunosuppressive drugs, initial encounter: Secondary | ICD-10-CM

## 2018-08-08 DIAGNOSIS — N183 Chronic kidney disease, stage 3 unspecified: Secondary | ICD-10-CM

## 2018-08-08 DIAGNOSIS — R740 Nonspecific elevation of levels of transaminase and lactic acid dehydrogenase [LDH]: Secondary | ICD-10-CM

## 2018-08-08 DIAGNOSIS — C251 Malignant neoplasm of body of pancreas: Secondary | ICD-10-CM

## 2018-08-08 DIAGNOSIS — R634 Abnormal weight loss: Secondary | ICD-10-CM

## 2018-08-08 DIAGNOSIS — C787 Secondary malignant neoplasm of liver and intrahepatic bile duct: Secondary | ICD-10-CM

## 2018-08-08 DIAGNOSIS — D61810 Antineoplastic chemotherapy induced pancytopenia: Secondary | ICD-10-CM

## 2018-08-08 DIAGNOSIS — Z7189 Other specified counseling: Secondary | ICD-10-CM

## 2018-08-08 DIAGNOSIS — H8109 Meniere's disease, unspecified ear: Secondary | ICD-10-CM

## 2018-08-08 DIAGNOSIS — D6481 Anemia due to antineoplastic chemotherapy: Secondary | ICD-10-CM

## 2018-08-08 LAB — CBC WITH DIFFERENTIAL/PLATELET
Abs Immature Granulocytes: 0.02 10*3/uL (ref 0.00–0.07)
Basophils Absolute: 0 10*3/uL (ref 0.0–0.1)
Basophils Relative: 0 %
Eosinophils Absolute: 0 10*3/uL (ref 0.0–0.5)
Eosinophils Relative: 1 %
HCT: 21 % — ABNORMAL LOW (ref 36.0–46.0)
Hemoglobin: 6.7 g/dL — CL (ref 12.0–15.0)
Immature Granulocytes: 0 %
Lymphocytes Relative: 8 %
Lymphs Abs: 0.5 10*3/uL — ABNORMAL LOW (ref 0.7–4.0)
MCH: 32.8 pg (ref 26.0–34.0)
MCHC: 31.9 g/dL (ref 30.0–36.0)
MCV: 102.9 fL — AB (ref 80.0–100.0)
Monocytes Absolute: 0.6 10*3/uL (ref 0.1–1.0)
Monocytes Relative: 10 %
Neutro Abs: 4.4 10*3/uL (ref 1.7–7.7)
Neutrophils Relative %: 81 %
PLATELETS: 274 10*3/uL (ref 150–400)
RBC: 2.04 MIL/uL — ABNORMAL LOW (ref 3.87–5.11)
RDW: 15.5 % (ref 11.5–15.5)
WBC: 5.5 10*3/uL (ref 4.0–10.5)
nRBC: 0 % (ref 0.0–0.2)

## 2018-08-08 LAB — COMPREHENSIVE METABOLIC PANEL
ALT: 58 U/L — ABNORMAL HIGH (ref 0–44)
AST: 57 U/L — ABNORMAL HIGH (ref 15–41)
Albumin: 2.7 g/dL — ABNORMAL LOW (ref 3.5–5.0)
Alkaline Phosphatase: 551 U/L — ABNORMAL HIGH (ref 38–126)
Anion gap: 10 (ref 5–15)
BUN: 14 mg/dL (ref 6–20)
CHLORIDE: 101 mmol/L (ref 98–111)
CO2: 27 mmol/L (ref 22–32)
Calcium: 9.2 mg/dL (ref 8.9–10.3)
Creatinine, Ser: 1.14 mg/dL — ABNORMAL HIGH (ref 0.44–1.00)
GFR calc Af Amer: >60 mL/min (ref >60)
GFR, EST NON AFRICAN AMERICAN: 54 mL/min — AB (ref >60)
Glucose, Bld: 128 mg/dL — ABNORMAL HIGH (ref 70–99)
Potassium: 3.9 mmol/L (ref 3.5–5.1)
Sodium: 138 mmol/L (ref 135–145)
Total Bilirubin: 0.6 mg/dL (ref 0.3–1.2)
Total Protein: 7.6 g/dL (ref 6.5–8.1)

## 2018-08-08 LAB — SAMPLE TO BLOOD BANK

## 2018-08-08 LAB — PREPARE RBC (CROSSMATCH)

## 2018-08-08 MED ORDER — DIPHENHYDRAMINE HCL 25 MG PO CAPS
25.0000 mg | ORAL_CAPSULE | Freq: Once | ORAL | Status: AC
  Administered 2018-08-08: 25 mg via ORAL

## 2018-08-08 MED ORDER — PROCHLORPERAZINE MALEATE 10 MG PO TABS
ORAL_TABLET | ORAL | Status: AC
  Filled 2018-08-08: qty 1

## 2018-08-08 MED ORDER — SODIUM CHLORIDE 0.9 % IV SOLN
Freq: Once | INTRAVENOUS | Status: AC
  Administered 2018-08-08: 11:00:00 via INTRAVENOUS
  Filled 2018-08-08: qty 250

## 2018-08-08 MED ORDER — DIPHENHYDRAMINE HCL 25 MG PO CAPS
ORAL_CAPSULE | ORAL | Status: AC
  Filled 2018-08-08: qty 1

## 2018-08-08 MED ORDER — ACETAMINOPHEN 325 MG PO TABS
650.0000 mg | ORAL_TABLET | Freq: Once | ORAL | Status: AC
  Administered 2018-08-08: 650 mg via ORAL

## 2018-08-08 MED ORDER — HEPARIN SOD (PORK) LOCK FLUSH 100 UNIT/ML IV SOLN
500.0000 [IU] | Freq: Once | INTRAVENOUS | Status: AC | PRN
  Administered 2018-08-08: 500 [IU]
  Filled 2018-08-08: qty 5

## 2018-08-08 MED ORDER — SODIUM CHLORIDE 0.9% IV SOLUTION
250.0000 mL | Freq: Once | INTRAVENOUS | Status: AC
  Administered 2018-08-08: 250 mL via INTRAVENOUS
  Filled 2018-08-08: qty 250

## 2018-08-08 MED ORDER — HEPARIN SOD (PORK) LOCK FLUSH 100 UNIT/ML IV SOLN
500.0000 [IU] | Freq: Every day | INTRAVENOUS | Status: DC | PRN
  Filled 2018-08-08: qty 5

## 2018-08-08 MED ORDER — PROCHLORPERAZINE MALEATE 10 MG PO TABS
10.0000 mg | ORAL_TABLET | Freq: Once | ORAL | Status: AC
  Administered 2018-08-08: 10 mg via ORAL

## 2018-08-08 MED ORDER — SODIUM CHLORIDE 0.9% FLUSH
10.0000 mL | INTRAVENOUS | Status: DC | PRN
  Administered 2018-08-08: 10 mL
  Filled 2018-08-08: qty 10

## 2018-08-08 MED ORDER — SODIUM CHLORIDE 0.9 % IV SOLN
60.0000 mg | Freq: Once | INTRAVENOUS | Status: AC
  Administered 2018-08-08: 60 mg via INTRAVENOUS
  Filled 2018-08-08: qty 13.95

## 2018-08-08 MED ORDER — ACETAMINOPHEN 325 MG PO TABS
ORAL_TABLET | ORAL | Status: AC
  Filled 2018-08-08: qty 2

## 2018-08-08 NOTE — Patient Instructions (Signed)
Implanted Port Home Guide An implanted port is a type of central line that is placed under the skin. Central lines are used to provide IV access when treatment or nutrition needs to be given through a person's veins. Implanted ports are used for long-term IV access. An implanted port may be placed because:  You need IV medicine that would be irritating to the small veins in your hands or arms.  You need long-term IV medicines, such as antibiotics.  You need IV nutrition for a long period.  You need frequent blood draws for lab tests.  You need dialysis.  Implanted ports are usually placed in the chest area, but they can also be placed in the upper arm, the abdomen, or the leg. An implanted port has two main parts:  Reservoir. The reservoir is round and will appear as a small, raised area under your skin. The reservoir is the part where a needle is inserted to give medicines or draw blood.  Catheter. The catheter is a thin, flexible tube that extends from the reservoir. The catheter is placed into a large vein. Medicine that is inserted into the reservoir goes into the catheter and then into the vein.  How will I care for my incision site? Do not get the incision site wet. Bathe or shower as directed by your health care provider. How is my port accessed? Special steps must be taken to access the port:  Before the port is accessed, a numbing cream can be placed on the skin. This helps numb the skin over the port site.  Your health care provider uses a sterile technique to access the port. ? Your health care provider must put on a mask and sterile gloves. ? The skin over your port is cleaned carefully with an antiseptic and allowed to dry. ? The port is gently pinched between sterile gloves, and a needle is inserted into the port.  Only "non-coring" port needles should be used to access the port. Once the port is accessed, a blood return should be checked. This helps ensure that the port  is in the vein and is not clogged.  If your port needs to remain accessed for a constant infusion, a clear (transparent) bandage will be placed over the needle site. The bandage and needle will need to be changed every week, or as directed by your health care provider.  Keep the bandage covering the needle clean and dry. Do not get it wet. Follow your health care provider's instructions on how to take a shower or bath while the port is accessed.  If your port does not need to stay accessed, no bandage is needed over the port.  What is flushing? Flushing helps keep the port from getting clogged. Follow your health care provider's instructions on how and when to flush the port. Ports are usually flushed with saline solution or a medicine called heparin. The need for flushing will depend on how the port is used.  If the port is used for intermittent medicines or blood draws, the port will need to be flushed: ? After medicines have been given. ? After blood has been drawn. ? As part of routine maintenance.  If a constant infusion is running, the port may not need to be flushed.  How long will my port stay implanted? The port can stay in for as long as your health care provider thinks it is needed. When it is time for the port to come out, surgery will be   done to remove it. The procedure is similar to the one performed when the port was put in. When should I seek immediate medical care? When you have an implanted port, you should seek immediate medical care if:  You notice a bad smell coming from the incision site.  You have swelling, redness, or drainage at the incision site.  You have more swelling or pain at the port site or the surrounding area.  You have a fever that is not controlled with medicine.  This information is not intended to replace advice given to you by your health care provider. Make sure you discuss any questions you have with your health care provider. Document  Released: 08/10/2005 Document Revised: 01/16/2016 Document Reviewed: 04/17/2013 Elsevier Interactive Patient Education  2017 Elsevier Inc.  

## 2018-08-08 NOTE — Patient Instructions (Signed)
[facility] Discharge Instructions for Patients Receiving Chemotherapy  Today you received the following chemotherapy agents :  Onivyde.  To help prevent nausea and vomiting after your treatment, we encourage you to take your nausea medication as prescribed.   If you develop nausea and vomiting that is not controlled by your nausea medication, call the clinic.   BELOW ARE SYMPTOMS THAT SHOULD BE REPORTED IMMEDIATELY:  *FEVER GREATER THAN 100.5 F  *CHILLS WITH OR WITHOUT FEVER  NAUSEA AND VOMITING THAT IS NOT CONTROLLED WITH YOUR NAUSEA MEDICATION  *UNUSUAL SHORTNESS OF BREATH  *UNUSUAL BRUISING OR BLEEDING  TENDERNESS IN MOUTH AND THROAT WITH OR WITHOUT PRESENCE OF ULCERS  *URINARY PROBLEMS  *BOWEL PROBLEMS  UNUSUAL RASH Items with * indicate a potential emergency and should be followed up as soon as possible.  Feel free to call the clinic should you have any questions or concerns. The clinic phone number is [phone].  Please show the [facility] at check-in to the Emergency Department and triage nurse.   Blood Transfusion, Care After This sheet gives you information about how to care for yourself after your procedure. Your doctor may also give you more specific instructions. If you have problems or questions, contact your doctor. Follow these instructions at home:  Take over-the-counter and prescription medicines only as told by your doctor.  Go back to your normal activities as told by your doctor.  Follow instructions from your doctor about how to take care of the area where an IV tube was put into your vein (insertion site). Make sure you: ? Wash your hands with soap and water before you change your bandage (dressing). If there is no soap and water, use hand sanitizer. ? Change your bandage as told by your doctor.  Check your IV insertion site every day for signs of infection. Check for: ? More redness, swelling, or pain. ? More fluid or  blood. ? Warmth. ? Pus or a bad smell. Contact a doctor if:  You have more redness, swelling, or pain around the IV insertion site..  You have more fluid or blood coming from the IV insertion site.  Your IV insertion site feels warm to the touch.  You have pus or a bad smell coming from the IV insertion site.  Your pee (urine) turns pink, red, or brown.  You feel weak after doing your normal activities. Get help right away if:  You have signs of a serious allergic or body defense (immune) system reaction, including: ? Itchiness. ? Hives. ? Trouble breathing. ? Anxiety. ? Pain in your chest or lower back. ? Fever, flushing, and chills. ? Fast pulse. ? Rash. ? Watery poop (diarrhea). ? Throwing up (vomiting). ? Dark pee. ? Serious headache. ? Dizziness. ? Stiff neck. ? Yellow color in your face or the white parts of your eyes (jaundice). Summary  After a blood transfusion, return to your normal activities as told by your doctor.  Every day, check for signs of infection where the IV tube was put into your vein.  Some signs of infection are warm skin, more redness and pain, more fluid or blood, and pus or a bad smell where the needle went in.  Contact your doctor if you feel weak or have any unusual symptoms. This information is not intended to replace advice given to you by your health care provider. Make sure you discuss any questions you have with your health care provider. Document Released: 08/31/2014 Document Revised: 04/03/2016  Reviewed: 04/03/2016 Elsevier Interactive Patient Education  2017 [employer] Inc[employer]  

## 2018-08-09 ENCOUNTER — Encounter: Payer: Self-pay | Admitting: Hematology

## 2018-08-09 LAB — TYPE AND SCREEN
ABO/RH(D): A POS
Antibody Screen: NEGATIVE
UNIT DIVISION: 0
Unit division: 0

## 2018-08-09 LAB — BPAM RBC
Blood Product Expiration Date: 202001112359
Blood Product Expiration Date: 202001112359
ISSUE DATE / TIME: 201912161358
ISSUE DATE / TIME: 201912161358
UNIT TYPE AND RH: 6200
Unit Type and Rh: 6200

## 2018-08-10 ENCOUNTER — Inpatient Hospital Stay: Payer: 59

## 2018-08-10 MED ORDER — PEGFILGRASTIM INJECTION 6 MG/0.6ML ~~LOC~~
PREFILLED_SYRINGE | SUBCUTANEOUS | Status: AC
  Filled 2018-08-10: qty 0.6

## 2018-08-11 ENCOUNTER — Telehealth: Payer: Self-pay | Admitting: Hematology

## 2018-08-11 NOTE — Telephone Encounter (Signed)
Printed and mailed calendar. °

## 2018-08-22 ENCOUNTER — Telehealth: Payer: Self-pay

## 2018-08-22 ENCOUNTER — Other Ambulatory Visit: Payer: Self-pay | Admitting: Hematology

## 2018-08-22 DIAGNOSIS — C251 Malignant neoplasm of body of pancreas: Secondary | ICD-10-CM

## 2018-08-22 MED ORDER — TRAMADOL HCL 50 MG PO TABS: 50.0000 mg | ORAL_TABLET | Freq: Four times a day (QID) | ORAL | 0 refills | Status: DC | PRN

## 2018-08-22 NOTE — Telephone Encounter (Signed)
Patient calls for refill on Tramadol to be sent into [facility].  Throwing out the previous pills they have expired.

## 2018-08-22 NOTE — Telephone Encounter (Signed)
Refill called in, thanks   Yaneli Keithley MD  

## 2018-08-23 ENCOUNTER — Inpatient Hospital Stay (HOSPITAL_BASED_OUTPATIENT_CLINIC_OR_DEPARTMENT_OTHER): Payer: 59 | Admitting: Nurse Practitioner

## 2018-08-23 ENCOUNTER — Inpatient Hospital Stay: Payer: 59

## 2018-08-23 ENCOUNTER — Encounter: Payer: Self-pay | Admitting: Nurse Practitioner

## 2018-08-23 VITALS — BP 110/70 | HR 87 | Temp 98.2°F | Resp 18 | Ht 65.0 in | Wt 100.9 lb

## 2018-08-23 DIAGNOSIS — C251 Malignant neoplasm of body of pancreas: Secondary | ICD-10-CM

## 2018-08-23 DIAGNOSIS — Z95828 Presence of other vascular implants and grafts: Secondary | ICD-10-CM

## 2018-08-23 DIAGNOSIS — C259 Malignant neoplasm of pancreas, unspecified: Secondary | ICD-10-CM

## 2018-08-23 DIAGNOSIS — R740 Nonspecific elevation of levels of transaminase and lactic acid dehydrogenase [LDH]: Secondary | ICD-10-CM

## 2018-08-23 DIAGNOSIS — L989 Disorder of the skin and subcutaneous tissue, unspecified: Secondary | ICD-10-CM

## 2018-08-23 DIAGNOSIS — D61810 Antineoplastic chemotherapy induced pancytopenia: Secondary | ICD-10-CM

## 2018-08-23 DIAGNOSIS — R634 Abnormal weight loss: Secondary | ICD-10-CM

## 2018-08-23 DIAGNOSIS — C787 Secondary malignant neoplasm of liver and intrahepatic bile duct: Principal | ICD-10-CM

## 2018-08-23 DIAGNOSIS — Z7189 Other specified counseling: Secondary | ICD-10-CM

## 2018-08-23 DIAGNOSIS — T451X5A Adverse effect of antineoplastic and immunosuppressive drugs, initial encounter: Secondary | ICD-10-CM

## 2018-08-23 DIAGNOSIS — N183 Chronic kidney disease, stage 3 (moderate): Secondary | ICD-10-CM | POA: Diagnosis not present

## 2018-08-23 DIAGNOSIS — G620 Drug-induced polyneuropathy: Secondary | ICD-10-CM

## 2018-08-23 DIAGNOSIS — Z5111 Encounter for antineoplastic chemotherapy: Secondary | ICD-10-CM | POA: Diagnosis not present

## 2018-08-23 LAB — COMPREHENSIVE METABOLIC PANEL
ALT: 45 U/L — ABNORMAL HIGH (ref 0–44)
AST: 50 U/L — ABNORMAL HIGH (ref 15–41)
Albumin: 2.7 g/dL — ABNORMAL LOW (ref 3.5–5.0)
Alkaline Phosphatase: 537 U/L — ABNORMAL HIGH (ref 38–126)
Anion gap: 11 (ref 5–15)
BILIRUBIN TOTAL: 0.8 mg/dL (ref 0.3–1.2)
BUN: 16 mg/dL (ref 6–20)
CO2: 26 mmol/L (ref 22–32)
Calcium: 10 mg/dL (ref 8.9–10.3)
Chloride: 100 mmol/L (ref 98–111)
Creatinine, Ser: 1.14 mg/dL — ABNORMAL HIGH (ref 0.44–1.00)
GFR calc Af Amer: >60 mL/min (ref >60)
GFR calc non Af Amer: 54 mL/min — ABNORMAL LOW (ref >60)
Glucose, Bld: 151 mg/dL — ABNORMAL HIGH (ref 70–99)
POTASSIUM: 3.5 mmol/L (ref 3.5–5.1)
Sodium: 137 mmol/L (ref 135–145)
TOTAL PROTEIN: 7.8 g/dL (ref 6.5–8.1)

## 2018-08-23 LAB — CBC WITH DIFFERENTIAL/PLATELET
Abs Immature Granulocytes: 0.02 10*3/uL (ref 0.00–0.07)
Basophils Absolute: 0 10*3/uL (ref 0.0–0.1)
Basophils Relative: 0 %
EOS ABS: 0 10*3/uL (ref 0.0–0.5)
Eosinophils Relative: 1 %
HCT: 26.8 % — ABNORMAL LOW (ref 36.0–46.0)
Hemoglobin: 8.8 g/dL — ABNORMAL LOW (ref 12.0–15.0)
Immature Granulocytes: 0 %
Lymphocytes Relative: 9 %
Lymphs Abs: 0.4 10*3/uL — ABNORMAL LOW (ref 0.7–4.0)
MCH: 32 pg (ref 26.0–34.0)
MCHC: 32.8 g/dL (ref 30.0–36.0)
MCV: 97.5 fL (ref 80.0–100.0)
Monocytes Absolute: 0.5 10*3/uL (ref 0.1–1.0)
Monocytes Relative: 10 %
NEUTROS PCT: 80 %
NRBC: 0 % (ref 0.0–0.2)
Neutro Abs: 3.8 10*3/uL (ref 1.7–7.7)
Platelets: 219 10*3/uL (ref 150–400)
RBC: 2.75 MIL/uL — ABNORMAL LOW (ref 3.87–5.11)
RDW: 15.1 % (ref 11.5–15.5)
WBC: 4.8 10*3/uL (ref 4.0–10.5)

## 2018-08-23 MED ORDER — PROCHLORPERAZINE MALEATE 10 MG PO TABS
10.0000 mg | ORAL_TABLET | Freq: Once | ORAL | Status: AC
  Administered 2018-08-23: 10 mg via ORAL

## 2018-08-23 MED ORDER — LEUCOVORIN CALCIUM INJECTION 350 MG
400.0000 mg/m2 | Freq: Once | INTRAVENOUS | Status: AC
  Administered 2018-08-23: 592 mg via INTRAVENOUS
  Filled 2018-08-23: qty 29.6

## 2018-08-23 MED ORDER — SODIUM CHLORIDE 0.9% FLUSH
10.0000 mL | Freq: Once | INTRAVENOUS | Status: AC
  Administered 2018-08-23: 10 mL
  Filled 2018-08-23: qty 10

## 2018-08-23 MED ORDER — SODIUM CHLORIDE 0.9 % IV SOLN
Freq: Once | INTRAVENOUS | Status: AC
  Administered 2018-08-23: 11:00:00 via INTRAVENOUS
  Filled 2018-08-23: qty 250

## 2018-08-23 MED ORDER — PROCHLORPERAZINE MALEATE 10 MG PO TABS
ORAL_TABLET | ORAL | Status: AC
  Filled 2018-08-23: qty 1

## 2018-08-23 MED ORDER — SODIUM CHLORIDE 0.9 % IV SOLN
60.0000 mg | Freq: Once | INTRAVENOUS | Status: AC
  Administered 2018-08-23: 60 mg via INTRAVENOUS
  Filled 2018-08-23: qty 13.95

## 2018-08-23 MED ORDER — SODIUM CHLORIDE 0.9 % IV SOLN
1800.0000 mg/m2 | INTRAVENOUS | Status: DC
  Administered 2018-08-23: 2650 mg via INTRAVENOUS
  Filled 2018-08-23: qty 53

## 2018-08-23 NOTE — Patient Instructions (Signed)
[facility] Discharge Instructions for Patients Receiving Chemotherapy  Today you received the following chemotherapy agents: Irinotecan liposome (Onivyde), Leucovorin, Fluorouracil (Adrucil, 5-FU)  To help prevent nausea and vomiting after your treatment, we encourage you to take your nausea medication as directed.    If you develop nausea and vomiting that is not controlled by your nausea medication, call the clinic.   BELOW ARE SYMPTOMS THAT SHOULD BE REPORTED IMMEDIATELY:  *FEVER GREATER THAN 100.5 F  *CHILLS WITH OR WITHOUT FEVER  NAUSEA AND VOMITING THAT IS NOT CONTROLLED WITH YOUR NAUSEA MEDICATION  *UNUSUAL SHORTNESS OF BREATH  *UNUSUAL BRUISING OR BLEEDING  TENDERNESS IN MOUTH AND THROAT WITH OR WITHOUT PRESENCE OF ULCERS  *URINARY PROBLEMS  *BOWEL PROBLEMS  UNUSUAL RASH Items with * indicate a potential emergency and should be followed up as soon as possible.  Feel free to call the clinic should you have any questions or concerns. The clinic phone number is [phone].  Please show the [facility] at check-in to the Emergency Department and triage nurse.  [facility] Discharge Instructions for Patients receiving Home Portable Chemo Pump   **The bag should finish at 46 hours, 96 hours or 7 days. For example, if your pump is scheduled for 46 hours and it was put on at 4pm, it should finish at 2 pm the day it is scheduled to come off regardless of your appointment time.    Estimated time to finish   _________________________ (Have your nurse fill in)     ** if the display on your pump reads "Low Volume" and it is beeping, take the batteries out of the pump and come to the cancer center for it to be taken off.   **If the pump alarms go off prior to the pump reading "Low Volume" then call the [phone] and someone can assist you.  **If the plunger comes out and the bag fluid is running out, please use your chemo spill kit to  clean up the spill. Do not use paper towels or other house hold products.  ** If you have problems or questions regarding your pump, please call either the [phone] or the cancer center Monday-Friday 8:00am-4:30pm at [phone] and we will assist you.  If you are unable to get assistance then go to [facility] Emergency Room, ask the staff to contact the IV team for assistance.

## 2018-08-23 NOTE — Progress Notes (Signed)
  [facility] OFFICE PROGRESS NOTE   Diagnosis: Pancreas cancer  CURRENT THERAPY second line 5-FU pump infusion and liposomal irinotecan every 2 weeks, started on 02/22/2018, dose reduction due to pancytopenia and neuropathy  INTERVAL HISTORY:   Ms. Usery returns as scheduled.  She completed a cycle of liposomal irinotecan 08/08/2018.  5-fluorouracil was held due to a recent febrile illness, weakness related to anemia.  She also received a blood transfusion.  She in general feels better.  No nausea or vomiting.  No mouth sores.  No diarrhea.  She has noted a decrease in appetite over the past week or so.  Her weight has remained fairly stable.  She is having intermittent pain at the right abdomen.  She takes Ultram as needed.  A refill was sent in by our office yesterday.  She reports subcutaneous skin lesions remain resolved.  Objective:  Vital signs in last 24 hours:  Blood pressure 110/70, pulse 87, temperature 98.2 F (36.8 C), temperature source Oral, resp. rate 18, height _0  (1.651 m), weight 100 lb 14.4 oz (45.8 kg), last menstrual period 10/23/2015, SpO2 100 %.    HEENT: No thrush or ulcers. Resp: Lungs clear bilaterally. Cardio: Regular rate and rhythm. GI: Abdomen is soft.  Fullness at the right upper abdomen with associated tenderness.  No definite hepatomegaly. Vascular: No leg edema. Neuro: Alert and oriented. Skin: No rash. Port-A-Cath without erythema.   Lab Results:  Lab Results  Component Value Date   WBC 4.8 08/23/2018   HGB 8.8 (L) 08/23/2018   HCT 26.8 (L) 08/23/2018   MCV 97.5 08/23/2018   PLT 219 08/23/2018   NEUTROABS 3.8 08/23/2018    Imaging:  No results found.  Medications: I have reviewed the patient's current medications.  Assessment/Plan: 1. 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 2. Weight loss.  Stable 3. Transaminitis.  Stable to improved 4. Pancytopenia secondary to chemotherapy 5. Chronic kidney disease stage III 6. Peripheral neuropathy currently on Neurontin 7. Subcutaneous skin lesions  Disposition: Ms. Fogarty appears stable.  She is on active treatment with 5-FU pump and liposomal Irinotecan.  Plan to proceed with treatment today as scheduled.  She is scheduled to undergo a restaging PET scan on 09/01/2018.  She will return for a follow-up appointment on 09/05/2018 to review the results.  She will contact the office in the interim with any problems.     Ned Card ANP/GNP-BC   08/23/2018  10:13 AM

## 2018-08-24 LAB — CANCER ANTIGEN 19-9: CA 19-9: 28 U/mL (ref 0–35)

## 2018-08-25 ENCOUNTER — Inpatient Hospital Stay: Payer: 59 | Attending: Hematology

## 2018-08-25 DIAGNOSIS — E876 Hypokalemia: Secondary | ICD-10-CM | POA: Insufficient documentation

## 2018-08-25 DIAGNOSIS — G629 Polyneuropathy, unspecified: Secondary | ICD-10-CM | POA: Diagnosis not present

## 2018-08-25 DIAGNOSIS — R42 Dizziness and giddiness: Secondary | ICD-10-CM | POA: Insufficient documentation

## 2018-08-25 DIAGNOSIS — E46 Unspecified protein-calorie malnutrition: Secondary | ICD-10-CM | POA: Diagnosis not present

## 2018-08-25 DIAGNOSIS — Z9221 Personal history of antineoplastic chemotherapy: Secondary | ICD-10-CM | POA: Insufficient documentation

## 2018-08-25 DIAGNOSIS — Z5111 Encounter for antineoplastic chemotherapy: Secondary | ICD-10-CM | POA: Diagnosis present

## 2018-08-25 DIAGNOSIS — R740 Nonspecific elevation of levels of transaminase and lactic acid dehydrogenase [LDH]: Secondary | ICD-10-CM | POA: Diagnosis not present

## 2018-08-25 DIAGNOSIS — C251 Malignant neoplasm of body of pancreas: Secondary | ICD-10-CM

## 2018-08-25 DIAGNOSIS — Z7189 Other specified counseling: Secondary | ICD-10-CM

## 2018-08-25 DIAGNOSIS — K4090 Unilateral inguinal hernia, without obstruction or gangrene, not specified as recurrent: Secondary | ICD-10-CM | POA: Diagnosis not present

## 2018-08-25 DIAGNOSIS — C259 Malignant neoplasm of pancreas, unspecified: Secondary | ICD-10-CM | POA: Insufficient documentation

## 2018-08-25 DIAGNOSIS — C787 Secondary malignant neoplasm of liver and intrahepatic bile duct: Secondary | ICD-10-CM | POA: Insufficient documentation

## 2018-08-25 DIAGNOSIS — D61810 Antineoplastic chemotherapy induced pancytopenia: Secondary | ICD-10-CM | POA: Diagnosis not present

## 2018-08-25 DIAGNOSIS — N183 Chronic kidney disease, stage 3 (moderate): Secondary | ICD-10-CM | POA: Insufficient documentation

## 2018-08-25 MED ORDER — SODIUM CHLORIDE 0.9% FLUSH
10.0000 mL | INTRAVENOUS | Status: DC | PRN
  Administered 2018-08-25: 10 mL
  Filled 2018-08-25: qty 10

## 2018-08-25 MED ORDER — PEGFILGRASTIM INJECTION 6 MG/0.6ML ~~LOC~~
6.0000 mg | PREFILLED_SYRINGE | Freq: Once | SUBCUTANEOUS | Status: AC
  Administered 2018-08-25: 6 mg via SUBCUTANEOUS

## 2018-08-25 MED ORDER — HEPARIN SOD (PORK) LOCK FLUSH 100 UNIT/ML IV SOLN
500.0000 [IU] | Freq: Once | INTRAVENOUS | Status: AC | PRN
  Administered 2018-08-25: 500 [IU]
  Filled 2018-08-25: qty 5

## 2018-08-25 MED ORDER — PEGFILGRASTIM INJECTION 6 MG/0.6ML ~~LOC~~
PREFILLED_SYRINGE | SUBCUTANEOUS | Status: AC
  Filled 2018-08-25: qty 0.6

## 2018-09-01 ENCOUNTER — Encounter (HOSPITAL_COMMUNITY)
Admission: RE | Admit: 2018-09-01 | Discharge: 2018-09-01 | Disposition: A | Discharge status: Home / Self Care | Payer: 59 | Source: Ambulatory Visit | Attending: Hematology | Admitting: Hematology

## 2018-09-01 DIAGNOSIS — C787 Secondary malignant neoplasm of liver and intrahepatic bile duct: Secondary | ICD-10-CM | POA: Diagnosis not present

## 2018-09-01 DIAGNOSIS — C251 Malignant neoplasm of body of pancreas: Secondary | ICD-10-CM | POA: Diagnosis present

## 2018-09-01 DIAGNOSIS — C259 Malignant neoplasm of pancreas, unspecified: Secondary | ICD-10-CM | POA: Diagnosis not present

## 2018-09-01 LAB — GLUCOSE, CAPILLARY: Glucose-Capillary: 102 mg/dL — ABNORMAL HIGH (ref 70–99)

## 2018-09-01 MED ORDER — FLUDEOXYGLUCOSE F - 18 (FDG) INJECTION
4.9000 | Freq: Once | INTRAVENOUS | Status: AC
  Administered 2018-09-01: 4.9 via INTRAVENOUS

## 2018-09-02 NOTE — Progress Notes (Signed)
[name]   [phone] [phone]   Clinic Follow up Note   Patient Care Team: [name], MD as PCP - General (Family Medicine) [name], MD as Consulting Physician (Otolaryngology)  Date of Service:  09/05/2018  CHIEF COMPLAINT: F/u of pancreatic cancer  SUMMARY OF ONCOLOGIC HISTORY: Oncology History   Cancer Staging Pancreatic cancer [facility]) Staging form: Exocrine Pancreas, AJCC 8th Edition - Clinical stage from 08/06/2017: Stage IV (cTX, cN0, pM1) - Signed by [name], MD on 08/12/2017       Pancreatic cancer ([name])   07/23/2017 Imaging    US abdomen limited RUQ 07/23/17 IMPRESSION: 1. Cholelithiasis.  No secondary signs of acute cholecystitis. 2. Multiple solid liver masses measuring up to 6.5 cm in the left lobe of the liver, evaluation for metastatic disease is recommended. These results will be called to the ordering clinician or representative by the Radiologist Assistant, and communication documented in the PACS or zVision Dashboard.    07/23/2017 Imaging    CT Abdomen W Contrast 07/23/17 IMPRESSION: 1. Widespread metastatic disease throughout the liver. No clear primary malignancy identified in the abdomen. The pelvis was not imaged. Tissue sampling recommended. 2. Probable adenopathy superior to the pancreatic tail. No evidence of pancreatic mass. 3. Suspected incidental hemangioma inferiorly in the right hepatic lobe. 4. Nonspecific nodularity in the breasts. The patient has undergone recent (03/25/2017 and 04/01/2017) mammography and ultrasound.    07/29/2017 Initial Diagnosis    Metastasis to liver of unknown origin ([place])    07/29/2017 PET scan    PET 07/29/17  IMPRESSION: 1. Numerous bulky liver masses are hypermetabolic compatible with malignancy. 2. Accentuated activity within or along the pancreatic tail, likely represent a primary pancreatic tumor. Consider pancreatic protocol MRI to further work  this up. 3. The peripancreatic lymph node shown above the pancreatic tail is mildly hypermetabolic favoring malignancy. 4.  Prominent stool throughout the colon favors constipation. 5. 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    08/05/2017 Pathology Results    Liver Biopsy  Diagnosis 08/05/17 Liver, needle/core biopsy - CARCINOMA. - SEE COMMENT. Microscopic Comment The malignant cells are positive for cytokeratin 7. They are negative for arginase, CDX2, cytokeratin 20, estrogen receptor, GATA-3, GCDFP, Glypican 3, Hep Par 1, Napsin A, and TTF-1. This immunohistochemical is nonspecific. Possibly primary sources include pancreatobiliary and upper gastrointestinal. Radiologic correlation is necessary. Of note, organ specific markers (GATA-3, GCDFP-breast, TTF-1, Napsin A-lung, and CDX2-colon) are negative. (JBK:ecj 08/09/2017)    08/21/2017 - 02/10/2018 Chemotherapy    FOLFIRINOX every 2 weeks starting 08/21/17. Dose reduced due to neuropahty and cytopenia     10/14/2017 Imaging    CT CAP WO Contrast 10/14/17 IMPRESSION: Evidence of known numerous liver metastases without significant interval change. No other evidence of metastatic disease within the chest, abdomen or pelvis. Cholelithiasis. Tiny pericardial effusion.    12/02/2017 Genetic Testing    Negative for pathogenic mutation.  The genes analyzed were the 83 genes on [facility]'s Multi-Cancer panel (ALK, APC, ATM, AXIN2, BAP1, BARD1, BLM, BMPR1A, BRCA1, BRCA2, BRIP1, CASR, CDC73, CDH1, CDK4, CDKN1B, CDKN1C, CDKN2A, CEBPA, CHEK2, CTNNA1, DICER1, DIS3L2, EGFR, EPCAM, FH, FLCN, GATA2, GPC3, GREM1, HOXB13, HRAS, KIT, MAX, MEN1, MET, MITF, MLH1, MSH2, MSH3, MSH6, MUTYH, NBN, NF1, NF2, NTHL1, PALB2, PDGFRA, PHOX2B, PMS2, POLD1, POLE, POT1, PRKAR1A, PTCH1, PTEN, RAD50, RAD51C, RAD51D, RB1, RECQL4, RET, RUNX1, SDHA, SDHAF2, SDHB, SDHC, SDHD, SMAD4, SMARCA4, SMARCB1, SMARCE1, STK11, SUFU, TERC, TERT, TMEM127, TP53, TSC1, TSC2, VHL, WRN, WT1).  12/17/2017 PET scan    IMPRESSION: 1. Although the liver metastases are still visible on the CT images, they are significantly smaller and retain no abnormal metabolic activity, consistent with response to therapy. 2. No abnormal activity within the pancreas. 3. No disease progression identified. 4. Cholelithiasis.    02/21/2018 PET scan    02/21/2018 PET Scan  IMPRESSION: 1. There are 2 new small nodules identified within the right lung which measure up to 5 mm. These are too small to reliably characterize by PET-CT, but warrant close interval follow-up. 2. Again noted are multifocal liver metastasis. The target lesion within the left lobe is slightly decreased in size when compared with the previous exam. Similar to previous exam there is no abnormal hypermetabolism above background liver activity identified within the liver lesions. 3. Gallstones.    02/22/2018 - 08/25/2018 Chemotherapy    5-FU pump infusion and liposomal irinotecan, every 2 weeks.  First cycle dose reduced due to cytopenia in the travel. Stopped on 08/25/2018 due to disease progression.     05/17/2018 Imaging    05/17/2018 PET Scan IMPRESSION: 1. Mixed response. The 2 small right lung nodules have decreased in size in the interval. Single focus of increased uptake within the liver is new from 02/21/2018 and concerning for recurrent metabolically active liver metastasis. 2. Splenomegaly.  New from previous exam. 3. Diffusely increased bone marrow activity, likely reflecting treatment related changes.    09/01/2018 PET scan    PET 09/01/18 IMPRESSION: 1. Unfortunately there recurrence of hepatic metastasis with multiple new hypermetabolic lesions in LEFT and RIGHT hepatic lobe. 2. New extra hepatic site of malignancy which appears associated the mid pancreas. Difficult to define lesion on noncontrast exam. 3. Diffuse marrow activity is favored benign. 4. No evidence of pulmonary metastasis.     Chemotherapy     second-line Gemcitabine and Abraxane for 2 weeks on, 1 week off starting 09/12/2018. Due to neuropathy, I will start her with dose reduced Abraxane.      Pancreatic cancer metastasized to liver ([place])   08/11/2017 Initial Diagnosis    Pancreatic cancer metastasized to liver [facility])    09/05/2018 -  Chemotherapy    The patient had gemcitabine (GEMZAR) 1,444 mg in sodium chloride 0.9 % 100 mL chemo infusion, 1,000 mg/m2 = 1,444 mg, Intravenous,  Once, 0 of 4 cycles PACLitaxel-protein bound (ABRAXANE) chemo infusion 175 mg, 125 mg/m2 = 175 mg, Intravenous, Once, 0 of 4 cycles  for chemotherapy treatment.       CURRENT THERAPY:  Second-line Gemcitabine and Abraxane for 2 weeks on, 1 week off starting 09/12/2018  INTERVAL HISTORY:  Morgan Dawson is here for a follow up and treatment. She presents to the clinic today with her husband. She notes she is doing well now. She notes feeling better abdominally after her 5-FU pump. She is no longer taking tramadol for pain as pain has much improved. She notes energy level has recovered.  She notes planning to go to [place] at the end of February for a long weekend.     REVIEW OF SYSTEMS:   Constitutional: Denies fevers, chills or abnormal weight loss Eyes: Denies blurriness of vision Ears, nose, mouth, throat, and face: Denies mucositis or sore throat Respiratory: Denies cough, dyspnea or wheezes Cardiovascular: Denies palpitation, chest discomfort or lower extremity swelling Gastrointestinal:  Denies nausea, heartburn or change in bowel habits Skin: Denies abnormal skin rashes Lymphatics: Denies new lymphadenopathy or easy bruising Neurological:Denies numbness, tingling or new weaknesses Behavioral/Psych: Mood  is stable, no new changes  All other systems were reviewed with the patient and are negative.  MEDICAL HISTORY:  Past Medical History:  Diagnosis Date  . Genetic testing 12/02/2017   Multi-Cancer panel (83 genes) @ [facility] - No  pathogenic mutations detected  . Meniere disease 2016  . Pancreatitis     SURGICAL HISTORY: Past Surgical History:  Procedure Laterality Date  . CERVICAL ABLATION    . ENDOMETRIAL ABLATION  2011  . IR FLUORO GUIDE PORT INSERTION RIGHT  08/12/2017  . IR US GUIDE VASC ACCESS RIGHT  08/12/2017  . [place]    I have reviewed the social history and family history with the patient and they are unchanged from previous note.  ALLERGIES:  has No Known Allergies.  MEDICATIONS:  Current Outpatient Medications  Medication Sig Dispense Refill  . gabapentin (NEURONTIN) 100 MG capsule Take 1 capsule (100 mg total) by mouth 3 (three) times daily. 90 capsule 2  . loperamide (IMODIUM) 2 MG capsule Take 1 capsule (2 mg total) by mouth as needed for diarrhea or loose stools. 30 capsule 0  . loratadine (CLARITIN) 10 MG tablet Take 10 mg by mouth daily as needed for allergies.    . magic mouthwash w/lidocaine SOLN Take 10 mLs by mouth 3 (three) times daily as needed for mouth pain. Swish and spit 10 ML by mouth 3 times daily as needed for mouth pain. 240 mL 0  . meclizine (ANTIVERT) 25 MG tablet Take 25 mg by mouth 3 (three) times daily as needed for dizziness.    . ondansetron (ZOFRAN-ODT) 8 MG disintegrating tablet TAKE 1 TABLET BY MOUTH EVERY 8 HOURS AS NEEDED FOR NAUSE OR VOMITING 40 tablet 0  . potassium chloride (K-DUR) 10 MEQ tablet Take 1 tablet (10 mEq total) by mouth 4 (four) times daily. 360 tablet 2  . prochlorperazine (COMPAZINE) 10 MG tablet Take 1 tablet (10 mg total) by mouth every 6 (six) hours as needed for nausea or vomiting. 40 tablet 2  . traMADol (ULTRAM) 50 MG tablet Take 1 tablet (50 mg total) by mouth every 6 (six) hours as needed. 30 tablet 0   No current facility-administered medications for this visit.     PHYSICAL EXAMINATION: ECOG PERFORMANCE STATUS: 1 - Symptomatic but completely ambulatory  Vitals:   09/05/18 1009  BP: 110/72  Pulse: 90  Resp: 18    Temp: 98.9 F (37.2 C)  SpO2: 99%   Filed Weights   09/05/18 1009  Weight: 99 lb 11.2 oz (45.2 kg)    GENERAL:alert, no distress and comfortable SKIN: skin color, texture, turgor are normal, no rashes or significant lesions EYES: normal, Conjunctiva are pink and non-injected, sclera clear OROPHARYNX:no exudate, no erythema and lips, buccal mucosa, and tongue normal  NECK: supple, thyroid normal size, non-tender, without nodularity LYMPH:  no palpable lymphadenopathy in the cervical, axillary or inguinal LUNGS: clear to auscultation and percussion with normal breathing effort HEART: regular rate & rhythm and no murmurs and no lower extremity edema ABDOMEN:abdomen soft, non-tender and normal bowel sounds Musculoskeletal:no cyanosis of digits and no clubbing  NEURO: alert & oriented x 3 with fluent speech, no focal motor/sensory deficits  LABORATORY DATA:  I have reviewed the data as listed CBC Latest Ref Rng & Units 09/05/2018 08/23/2018 08/08/2018  WBC 4.0 - 10.5 K/uL 14.4(H) 4.8 5.5  Hemoglobin 12.0 - 15.0 g/dL 8.2(L) 8.8(L) 6.7(LL)  Hematocrit 36.0 - 46.0 % 25.5(L) 26.8(L) 21.0(L)  Platelets 150 - 400  K/uL 211 219 274     CMP Latest Ref Rng & Units 09/05/2018 08/23/2018 08/08/2018  Glucose 70 - 99 mg/dL 115(H) 151(H) 128(H)  BUN 6 - 20 mg/dL 16 16 14   Creatinine 0.44 - 1.00 mg/dL 1.09(H) 1.14(H) 1.14(H)  Sodium 135 - 145 mmol/L 138 137 138  Potassium 3.5 - 5.1 mmol/L 3.7 3.5 3.9  Chloride 98 - 111 mmol/L 103 100 101  CO2 22 - 32 mmol/L 27 26 27   Calcium 8.9 - 10.3 mg/dL 9.4 10.0 9.2  Total Protein 6.5 - 8.1 g/dL 7.5 7.8 7.6  Total Bilirubin 0.3 - 1.2 mg/dL 0.6 0.8 0.6  Alkaline Phos 38 - 126 U/L 596(H) 537(H) 551(H)  AST 15 - 41 U/L 52(H) 50(H) 57(H)  ALT 0 - 44 U/L 42 45(H) 58(H)      RADIOGRAPHIC STUDIES: I have personally reviewed the radiological images as listed and agreed with the findings in the report. No results found.   ASSESSMENT & PLAN:  CEIRA HOESCHEN is a 58 y.o. female with    1. 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 Treated with chemo. She was treated withfirst linechemoFOLFIRINOX with dose reduction. Due to neuropathy and cytopenia, chemo changed tosecond line 5-FU pump infusion and liposomal irinotecan every 2 weekswith dose reductiondue to cytopenia.  -We discussed in great detail with pt and her husband her PET from 09/01/18 which shows recurrence of liver metastasis with new lesions, no metastasis in chest. I reviewed her PET images with pt and her her husband in person. Her current regimen is no longer controlling her disease. -I recommend starting second-line Gemcitabine and Abraxane for 2 weeks on, 1 week off. I briefly discussed side effects (low blood count, diarrhea, constipation, hair loss and neuropathy). Due to neuropathy, I will start her with dose reduced Abraxane and recommend ice bags during infusion. She is agreeable. Will start once insurance approves it.  --Chemotherapy consent: Side effects including but does not not limited to, fatigue, nausea, vomiting, diarrhea, hair loss, neuropathy, fluid retention, renal and kidney dysfunction, neutropenic fever, needed for blood transfusion, bleeding, were discussed with patient in great detail. She agrees to proceed. -The goal of therapy is palliative -I discussed second-line therapy will likely not control her disease as long as her first treatment. I discussed considering clinical trails down the road. She is interested in starting to look for trail options.   -Labs reviewed, WBC at 14.4, Hg at 8.2, ANC at 12.5. CMP is still pending. No need for blood transfusion today. Her tumor marker has remained normal since early 2019.  -F/u in 2 weeks   2. Weight loss and malnutrition -Due to chemo and underlying metastatic cancer. -Continue nutritional supplement and f/u with dietician. -weight slightly  decreased to 99 pounds today (09/05/2018)  3. Transaminitis -Due to underlying chemo and malignancy -Stable and mild overall   4. Pancytopenia, secondary to chemo -Required chemo dose reduction. -S/p blood transfusion as needed with gh <8. Last on 08/10/18 -Labs reviewed, WBC at 14.4, Hg at 8.2, ANC at 12., PLT normal (09/05/2018). Blood transfusion next week if needed.   5. Hypokalemia, CKD Stage III -She is currently on KCL supplements -Potassium has been normal lately. Cr improving, at 1.09 today (09/05/2018)  6. Peripheral neuropathy, grade 2 -Currently on Neurontin 200 mg at night and B complexes. Continue.  -I recommend she use ice bags with Abraxane infusion, she is agreeable.   7. Goal of  care discussion -She is full code now -She understands her cancer is incurable at this stage, and the goal of therapy is palliative, to prolong his life, and prevent cancer related symptoms.    Plan   PET scan reviewed, unfortunately showed a significant disease progression Lab, flush, chemo gemcitabine and Abraxane in 1 and 2 weeks F/u in 2 weeks Blood transfusion 2hrs in one week if Hb<8.0    No problem-specific Assessment & Plan notes found for this encounter.   No orders of the defined types were placed in this encounter.  All questions were answered. The patient knows to call the clinic with any problems, questions or concerns. No barriers to learning was detected. I spent 25 minutes counseling the patient face to face. The total time spent in the appointment was 30 minutes and more than 50% was on counseling and review of test results     [name], MD 09/05/2018   I, Joslyn Devon, am acting as scribe for [name], MD.   I have reviewed the above documentation for accuracy and completeness, and I agree with the above.

## 2018-09-04 NOTE — Progress Notes (Signed)
OFF PATHWAY REGIMEN - Pancreatic  No Change  Continue With Treatment as Ordered.   [id number]:Liposomal Irinotecan 70 mg/m2 + Leucovorin 400 mg/m2 + 5-FU 2400 mg/m2 CIV q14 Days:   A cycle is every 14 days:     Liposomal irinotecan      Leucovorin      5-Fluorouracil   **Always confirm dose/schedule in your pharmacy ordering system**  Patient Characteristics: Adenocarcinoma, Metastatic Disease, Second Line, MSS/pMMR or MSI Unknown, If FOLFIRINOX First Line 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 

## 2018-09-04 NOTE — Progress Notes (Signed)
DISCONTINUE OFF PATHWAY REGIMEN - Pancreatic   [id number]:Liposomal Irinotecan 70 mg/m2 + Leucovorin 400 mg/m2 + 5-FU 2400 mg/m2 CIV q14 Days:   A cycle is every 14 days:     Liposomal irinotecan      Leucovorin      5-Fluorouracil   **Always confirm dose/schedule in your pharmacy ordering system**  REASON: Disease Progression PRIOR TREATMENT: Off Pathway: Liposomal Irinotecan 70 mg/m2 + Leucovorin 400 mg/m2 + 5-FU 2400 mg/m2 CIV q14 Days TREATMENT RESPONSE: Partial Response (PR)  START ON PATHWAY REGIMEN - Pancreatic     A cycle is every 28 days:     Nab-paclitaxel (protein bound)      Gemcitabine   **Always confirm dose/schedule in your pharmacy ordering system**  Patient Characteristics: Adenocarcinoma, Metastatic Disease, Second Line, MSS/pMMR or MSI Unknown, Fluoropyrimidine-Based Therapy First Line 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

## 2018-09-05 ENCOUNTER — Inpatient Hospital Stay: Payer: 59

## 2018-09-05 ENCOUNTER — Encounter: Payer: Self-pay | Admitting: Hematology

## 2018-09-05 ENCOUNTER — Inpatient Hospital Stay (HOSPITAL_BASED_OUTPATIENT_CLINIC_OR_DEPARTMENT_OTHER): Payer: 59 | Admitting: Hematology

## 2018-09-05 VITALS — BP 110/72 | HR 90 | Temp 98.9°F | Resp 18 | Ht 65.0 in | Wt 99.7 lb

## 2018-09-05 DIAGNOSIS — T451X5A Adverse effect of antineoplastic and immunosuppressive drugs, initial encounter: Secondary | ICD-10-CM

## 2018-09-05 DIAGNOSIS — Z95828 Presence of other vascular implants and grafts: Secondary | ICD-10-CM

## 2018-09-05 DIAGNOSIS — N183 Chronic kidney disease, stage 3 unspecified: Secondary | ICD-10-CM

## 2018-09-05 DIAGNOSIS — C251 Malignant neoplasm of body of pancreas: Secondary | ICD-10-CM

## 2018-09-05 DIAGNOSIS — C259 Malignant neoplasm of pancreas, unspecified: Secondary | ICD-10-CM | POA: Diagnosis not present

## 2018-09-05 DIAGNOSIS — E46 Unspecified protein-calorie malnutrition: Secondary | ICD-10-CM

## 2018-09-05 DIAGNOSIS — R634 Abnormal weight loss: Secondary | ICD-10-CM

## 2018-09-05 DIAGNOSIS — C787 Secondary malignant neoplasm of liver and intrahepatic bile duct: Secondary | ICD-10-CM | POA: Diagnosis not present

## 2018-09-05 DIAGNOSIS — D61810 Antineoplastic chemotherapy induced pancytopenia: Secondary | ICD-10-CM

## 2018-09-05 DIAGNOSIS — G620 Drug-induced polyneuropathy: Secondary | ICD-10-CM

## 2018-09-05 DIAGNOSIS — Z5111 Encounter for antineoplastic chemotherapy: Secondary | ICD-10-CM | POA: Diagnosis not present

## 2018-09-05 DIAGNOSIS — R740 Nonspecific elevation of levels of transaminase and lactic acid dehydrogenase [LDH]: Secondary | ICD-10-CM

## 2018-09-05 DIAGNOSIS — D6481 Anemia due to antineoplastic chemotherapy: Secondary | ICD-10-CM

## 2018-09-05 DIAGNOSIS — E876 Hypokalemia: Secondary | ICD-10-CM

## 2018-09-05 LAB — COMPREHENSIVE METABOLIC PANEL
ALT: 42 U/L (ref 0–44)
AST: 52 U/L — ABNORMAL HIGH (ref 15–41)
Albumin: 2.8 g/dL — ABNORMAL LOW (ref 3.5–5.0)
Alkaline Phosphatase: 596 U/L — ABNORMAL HIGH (ref 38–126)
Anion gap: 8 (ref 5–15)
BUN: 16 mg/dL (ref 6–20)
CO2: 27 mmol/L (ref 22–32)
CREATININE: 1.09 mg/dL — AB (ref 0.44–1.00)
Calcium: 9.4 mg/dL (ref 8.9–10.3)
Chloride: 103 mmol/L (ref 98–111)
GFR calc Af Amer: >60 mL/min (ref >60)
GFR calc non Af Amer: 57 mL/min — ABNORMAL LOW (ref >60)
Glucose, Bld: 115 mg/dL — ABNORMAL HIGH (ref 70–99)
Potassium: 3.7 mmol/L (ref 3.5–5.1)
Sodium: 138 mmol/L (ref 135–145)
Total Bilirubin: 0.6 mg/dL (ref 0.3–1.2)
Total Protein: 7.5 g/dL (ref 6.5–8.1)

## 2018-09-05 LAB — CBC WITH DIFFERENTIAL/PLATELET
Abs Immature Granulocytes: 0.25 10*3/uL — ABNORMAL HIGH (ref 0.00–0.07)
Basophils Absolute: 0.1 10*3/uL (ref 0.0–0.1)
Basophils Relative: 0 %
EOS PCT: 0 %
Eosinophils Absolute: 0 10*3/uL (ref 0.0–0.5)
HCT: 25.5 % — ABNORMAL LOW (ref 36.0–46.0)
HEMOGLOBIN: 8.2 g/dL — AB (ref 12.0–15.0)
Immature Granulocytes: 2 %
LYMPHS PCT: 5 %
Lymphs Abs: 0.7 10*3/uL (ref 0.7–4.0)
MCH: 31.7 pg (ref 26.0–34.0)
MCHC: 32.2 g/dL (ref 30.0–36.0)
MCV: 98.5 fL (ref 80.0–100.0)
Monocytes Absolute: 0.9 10*3/uL (ref 0.1–1.0)
Monocytes Relative: 6 %
Neutro Abs: 12.5 10*3/uL — ABNORMAL HIGH (ref 1.7–7.7)
Neutrophils Relative %: 87 %
Platelets: 211 10*3/uL (ref 150–400)
RBC: 2.59 MIL/uL — ABNORMAL LOW (ref 3.87–5.11)
RDW: 16.8 % — ABNORMAL HIGH (ref 11.5–15.5)
WBC: 14.4 10*3/uL — ABNORMAL HIGH (ref 4.0–10.5)
nRBC: 0 % (ref 0.0–0.2)

## 2018-09-05 MED ORDER — SODIUM CHLORIDE 0.9% FLUSH
10.0000 mL | Freq: Once | INTRAVENOUS | Status: AC
  Administered 2018-09-05: 10 mL
  Filled 2018-09-05: qty 10

## 2018-09-05 MED ORDER — HEPARIN SOD (PORK) LOCK FLUSH 100 UNIT/ML IV SOLN
500.0000 [IU] | Freq: Once | INTRAVENOUS | Status: AC
  Administered 2018-09-05: 500 [IU] via INTRAVENOUS
  Filled 2018-09-05: qty 5

## 2018-09-06 ENCOUNTER — Telehealth: Payer: Self-pay | Admitting: Hematology

## 2018-09-06 NOTE — Telephone Encounter (Signed)
Called patient about scheduled appts.  Patient aware of appts.

## 2018-09-08 ENCOUNTER — Telehealth: Payer: Self-pay | Admitting: Hematology

## 2018-09-08 NOTE — Telephone Encounter (Signed)
R/s appt per 1/13 sch message - pt is aware of new appt

## 2018-09-12 ENCOUNTER — Other Ambulatory Visit: Payer: 59

## 2018-09-12 ENCOUNTER — Ambulatory Visit: Payer: 59

## 2018-09-13 ENCOUNTER — Inpatient Hospital Stay: Payer: 59

## 2018-09-13 VITALS — BP 109/68 | HR 88 | Temp 98.2°F | Resp 18

## 2018-09-13 DIAGNOSIS — C251 Malignant neoplasm of body of pancreas: Secondary | ICD-10-CM

## 2018-09-13 DIAGNOSIS — Z95828 Presence of other vascular implants and grafts: Secondary | ICD-10-CM

## 2018-09-13 DIAGNOSIS — Z5111 Encounter for antineoplastic chemotherapy: Secondary | ICD-10-CM | POA: Diagnosis not present

## 2018-09-13 DIAGNOSIS — C259 Malignant neoplasm of pancreas, unspecified: Secondary | ICD-10-CM

## 2018-09-13 DIAGNOSIS — C787 Secondary malignant neoplasm of liver and intrahepatic bile duct: Secondary | ICD-10-CM

## 2018-09-13 DIAGNOSIS — T451X5A Adverse effect of antineoplastic and immunosuppressive drugs, initial encounter: Principal | ICD-10-CM

## 2018-09-13 DIAGNOSIS — D6481 Anemia due to antineoplastic chemotherapy: Secondary | ICD-10-CM

## 2018-09-13 DIAGNOSIS — Z7189 Other specified counseling: Secondary | ICD-10-CM

## 2018-09-13 LAB — CBC WITH DIFFERENTIAL/PLATELET
Abs Immature Granulocytes: 0.11 10*3/uL — ABNORMAL HIGH (ref 0.00–0.07)
Basophils Absolute: 0.1 10*3/uL (ref 0.0–0.1)
Basophils Relative: 0 %
EOS PCT: 0 %
Eosinophils Absolute: 0.1 10*3/uL (ref 0.0–0.5)
HCT: 24 % — ABNORMAL LOW (ref 36.0–46.0)
Hemoglobin: 7.8 g/dL — ABNORMAL LOW (ref 12.0–15.0)
Immature Granulocytes: 1 %
LYMPHS PCT: 6 %
Lymphs Abs: 0.8 10*3/uL (ref 0.7–4.0)
MCH: 32 pg (ref 26.0–34.0)
MCHC: 32.5 g/dL (ref 30.0–36.0)
MCV: 98.4 fL (ref 80.0–100.0)
Monocytes Absolute: 0.9 10*3/uL (ref 0.1–1.0)
Monocytes Relative: 6 %
Neutro Abs: 11.6 10*3/uL — ABNORMAL HIGH (ref 1.7–7.7)
Neutrophils Relative %: 87 %
Platelets: 210 10*3/uL (ref 150–400)
RBC: 2.44 MIL/uL — ABNORMAL LOW (ref 3.87–5.11)
RDW: 16.5 % — ABNORMAL HIGH (ref 11.5–15.5)
WBC: 13.4 10*3/uL — ABNORMAL HIGH (ref 4.0–10.5)
nRBC: 0 % (ref 0.0–0.2)

## 2018-09-13 LAB — COMPREHENSIVE METABOLIC PANEL
ALT: 41 U/L (ref 0–44)
AST: 56 U/L — ABNORMAL HIGH (ref 15–41)
Albumin: 2.5 g/dL — ABNORMAL LOW (ref 3.5–5.0)
Alkaline Phosphatase: 535 U/L — ABNORMAL HIGH (ref 38–126)
Anion gap: 9 (ref 5–15)
BUN: 20 mg/dL (ref 6–20)
CO2: 27 mmol/L (ref 22–32)
Calcium: 10.1 mg/dL (ref 8.9–10.3)
Chloride: 99 mmol/L (ref 98–111)
Creatinine, Ser: 1.02 mg/dL — ABNORMAL HIGH (ref 0.44–1.00)
GFR calc non Af Amer: >60 mL/min (ref >60)
GLUCOSE: 123 mg/dL — AB (ref 70–99)
Potassium: 3.8 mmol/L (ref 3.5–5.1)
Sodium: 135 mmol/L (ref 135–145)
Total Bilirubin: 1.4 mg/dL — ABNORMAL HIGH (ref 0.3–1.2)
Total Protein: 7.7 g/dL (ref 6.5–8.1)

## 2018-09-13 LAB — PREPARE RBC (CROSSMATCH)

## 2018-09-13 LAB — SAMPLE TO BLOOD BANK

## 2018-09-13 MED ORDER — PROCHLORPERAZINE MALEATE 10 MG PO TABS
10.0000 mg | ORAL_TABLET | Freq: Once | ORAL | Status: AC
  Administered 2018-09-13: 10 mg via ORAL

## 2018-09-13 MED ORDER — HEPARIN SOD (PORK) LOCK FLUSH 100 UNIT/ML IV SOLN
500.0000 [IU] | Freq: Once | INTRAVENOUS | Status: AC | PRN
  Administered 2018-09-13: 500 [IU]
  Filled 2018-09-13: qty 5

## 2018-09-13 MED ORDER — SODIUM CHLORIDE 0.9 % IV SOLN
1000.0000 mg/m2 | Freq: Once | INTRAVENOUS | Status: AC
  Administered 2018-09-13: 1444 mg via INTRAVENOUS
  Filled 2018-09-13: qty 37.98

## 2018-09-13 MED ORDER — PACLITAXEL PROTEIN-BOUND CHEMO INJECTION 100 MG
125.0000 mg/m2 | Freq: Once | INTRAVENOUS | Status: AC
  Administered 2018-09-13: 175 mg via INTRAVENOUS
  Filled 2018-09-13: qty 35

## 2018-09-13 MED ORDER — SODIUM CHLORIDE 0.9% FLUSH
10.0000 mL | INTRAVENOUS | Status: DC | PRN
  Administered 2018-09-13: 10 mL
  Filled 2018-09-13: qty 10

## 2018-09-13 MED ORDER — SODIUM CHLORIDE 0.9 % IV SOLN
Freq: Once | INTRAVENOUS | Status: AC
  Administered 2018-09-13: 16:00:00 via INTRAVENOUS
  Filled 2018-09-13: qty 250

## 2018-09-13 MED ORDER — PROCHLORPERAZINE MALEATE 10 MG PO TABS
ORAL_TABLET | ORAL | Status: AC
  Filled 2018-09-13: qty 1

## 2018-09-13 MED ORDER — SODIUM CHLORIDE 0.9% FLUSH
10.0000 mL | INTRAVENOUS | Status: DC | PRN
  Administered 2018-09-13: 10 mL via INTRAVENOUS
  Filled 2018-09-13: qty 10

## 2018-09-13 NOTE — Patient Instructions (Signed)
[facility] Discharge Instructions for Patients Receiving Chemotherapy  Today you received the following chemotherapy agents: Abraxane, Gemzar.  To help prevent nausea and vomiting after your treatment, we encourage you to take your nausea medication as prescribed.   If you develop nausea and vomiting that is not controlled by your nausea medication, call the clinic.   BELOW ARE SYMPTOMS THAT SHOULD BE REPORTED IMMEDIATELY:  *FEVER GREATER THAN 100.5 F  *CHILLS WITH OR WITHOUT FEVER  NAUSEA AND VOMITING THAT IS NOT CONTROLLED WITH YOUR NAUSEA MEDICATION  *UNUSUAL SHORTNESS OF BREATH  *UNUSUAL BRUISING OR BLEEDING  TENDERNESS IN MOUTH AND THROAT WITH OR WITHOUT PRESENCE OF ULCERS  *URINARY PROBLEMS  *BOWEL PROBLEMS  UNUSUAL RASH Items with * indicate a potential emergency and should be followed up as soon as possible.  Feel free to call the clinic should you have any questions or concerns. The clinic phone number is [phone].  Please show the [facility] at check-in to the Emergency Department and triage nurse.   Nanoparticle Albumin-Bound Paclitaxel injection What is this medicine? NANOPARTICLE ALBUMIN-BOUND PACLITAXEL (Na no PAHR ti kuhl al BYOO muhn-bound PAK li TAX el) is a chemotherapy drug. It targets fast dividing cells, like cancer cells, and causes these cells to die. This medicine is used to treat advanced breast cancer, lung cancer, and pancreatic cancer. This medicine may be used for other purposes; ask your health care provider or pharmacist if you have questions. COMMON BRAND NAME(S): Abraxane What should I tell my health care provider before I take this medicine? They need to know if you have any of these conditions: -kidney disease -liver disease -low blood counts, like low white cell, platelet, or red cell counts -lung or breathing disease, like asthma -tingling of the fingers or toes, or other nerve disorder -an unusual or  allergic reaction to paclitaxel, albumin, other chemotherapy, other medicines, foods, dyes, or preservatives -pregnant or trying to get pregnant -breast-feeding How should I use this medicine? This drug is given as an infusion into a vein. It is administered in a hospital or clinic by a specially trained health care professional. Talk to your pediatrician regarding the use of this medicine in children. Special care may be needed. Overdosage: If you think you have taken too much of this medicine contact a poison control center or emergency room at once. NOTE: This medicine is only for you. Do not share this medicine with others. What if I miss a dose? It is important not to miss your dose. Call your doctor or health care professional if you are unable to keep an appointment. What may interact with this medicine? This medicine may interact with the following medications: -antiviral medicines for hepatitis, HIV or AIDS -certain antibiotics like erythromycin and clarithromycin -certain medicines for fungal infections like ketoconazole and itraconazole -certain medicines for seizures like carbamazepine, phenobarbital, phenytoin -gemfibrozil -nefazodone -rifampin -St. John's wort This list may not describe all possible interactions. Give your health care provider a list of all the medicines, herbs, non-prescription drugs, or dietary supplements you use. Also tell them if you smoke, drink alcohol, or use illegal drugs. Some items may interact with your medicine. What should I watch for while using this medicine? Your condition will be monitored carefully while you are receiving this medicine. You will need important blood work done while you are taking this medicine. This medicine can cause serious allergic reactions. If you experience allergic reactions like skin rash, itching or hives, swelling of  the face, lips, or tongue, tell your doctor or health care professional right away. In some cases,  you may be given additional medicines to help with side effects. Follow all directions for their use. This drug may make you feel generally unwell. This is not uncommon, as chemotherapy can affect healthy cells as well as cancer cells. Report any side effects. Continue your course of treatment even though you feel ill unless your doctor tells you to stop. Call your doctor or health care professional for advice if you get a fever, chills or sore throat, or other symptoms of a cold or flu. Do not treat yourself. This drug decreases your body's ability to fight infections. Try to avoid being around people who are sick. This medicine may increase your risk to bruise or bleed. Call your doctor or health care professional if you notice any unusual bleeding. Be careful brushing and flossing your teeth or using a toothpick because you may get an infection or bleed more easily. If you have any dental work done, tell your dentist you are receiving this medicine. Avoid taking products that contain aspirin, acetaminophen, ibuprofen, naproxen, or ketoprofen unless instructed by your doctor. These medicines may hide a fever. Do not become pregnant while taking this medicine or for 6 months after stopping it. Women should inform their doctor if they wish to become pregnant or think they might be pregnant. Men should not father a child while taking this medicine or for 3 months after stopping it. There is a potential for serious side effects to an unborn child. Talk to your health care professional or pharmacist for more information. Do not breast-feed an infant while taking this medicine or for 2 weeks after stopping it. This medicine may interfere with the ability to get pregnant or to father a child. You should talk to your doctor or health care professional if you are concerned about your fertility. What side effects may I notice from receiving this medicine? Side effects that you should report to your doctor or  health care professional as soon as possible: -allergic reactions like skin rash, itching or hives, swelling of the face, lips, or tongue -breathing problems -changes in vision -fast, irregular heartbeat -low blood pressure -mouth sores -pain, tingling, numbness in the hands or feet -signs of decreased platelets or bleeding - bruising, pinpoint red spots on the skin, black, tarry stools, blood in the urine -signs of decreased red blood cells - unusually weak or tired, feeling faint or lightheaded, falls -signs of infection - fever or chills, cough, sore throat, pain or difficulty passing urine -signs and symptoms of liver injury like dark yellow or brown urine; general ill feeling or flu-like symptoms; light-colored stools; loss of appetite; nausea; right upper belly pain; unusually weak or tired; yellowing of the eyes or skin -swelling of the ankles, feet, hands -unusually slow heartbeat Side effects that usually do not require medical attention (report to your doctor or health care professional if they continue or are bothersome): -diarrhea -hair loss -loss of appetite -nausea, vomiting -tiredness This list may not describe all possible side effects. Call your doctor for medical advice about side effects. You may report side effects to FDA at 1-800-FDA-1088. Where should I keep my medicine? This drug is given in a hospital or clinic and will not be stored at home. NOTE: This sheet is a summary. It may not cover all possible information. If you have questions about this medicine, talk to your doctor, pharmacist, or health care   provider.  2019 [employer] Standard (2017-04-13 13:03:45)   Gemcitabine injection What is this medicine? GEMCITABINE (jem SYE ta been) is a chemotherapy drug. This medicine is used to treat many types of cancer like breast cancer, lung cancer, pancreatic cancer, and ovarian cancer. This medicine may be used for other purposes; ask your health care provider or  pharmacist if you have questions. COMMON BRAND NAME(S): Gemzar, Infugem What should I tell my health care provider before I take this medicine? They need to know if you have any of these conditions: -blood disorders -infection -kidney disease -liver disease -lung or breathing disease, like asthma -recent or ongoing radiation therapy -an unusual or allergic reaction to gemcitabine, other chemotherapy, other medicines, foods, dyes, or preservatives -pregnant or trying to get pregnant -breast-feeding How should I use this medicine? This drug is given as an infusion into a vein. It is administered in a hospital or clinic by a specially trained health care professional. Talk to your pediatrician regarding the use of this medicine in children. Special care may be needed. Overdosage: If you think you have taken too much of this medicine contact a poison control center or emergency room at once. NOTE: This medicine is only for you. Do not share this medicine with others. What if I miss a dose? It is important not to miss your dose. Call your doctor or health care professional if you are unable to keep an appointment. What may interact with this medicine? -medicines to increase blood counts like filgrastim, pegfilgrastim, sargramostim -some other chemotherapy drugs like cisplatin -vaccines Talk to your doctor or health care professional before taking any of these medicines: -acetaminophen -aspirin -ibuprofen -ketoprofen -naproxen This list may not describe all possible interactions. Give your health care provider a list of all the medicines, herbs, non-prescription drugs, or dietary supplements you use. Also tell them if you smoke, drink alcohol, or use illegal drugs. Some items may interact with your medicine. What should I watch for while using this medicine? Visit your doctor for checks on your progress. This drug may make you feel generally unwell. This is not uncommon, as chemotherapy can  affect healthy cells as well as cancer cells. Report any side effects. Continue your course of treatment even though you feel ill unless your doctor tells you to stop. In some cases, you may be given additional medicines to help with side effects. Follow all directions for their use. Call your doctor or health care professional for advice if you get a fever, chills or sore throat, or other symptoms of a cold or flu. Do not treat yourself. This drug decreases your body's ability to fight infections. Try to avoid being around people who are sick. This medicine may increase your risk to bruise or bleed. Call your doctor or health care professional if you notice any unusual bleeding. Be careful brushing and flossing your teeth or using a toothpick because you may get an infection or bleed more easily. If you have any dental work done, tell your dentist you are receiving this medicine. Avoid taking products that contain aspirin, acetaminophen, ibuprofen, naproxen, or ketoprofen unless instructed by your doctor. These medicines may hide a fever. Do not become pregnant while taking this medicine or for 6 months after stopping it. Women should inform their doctor if they wish to become pregnant or think they might be pregnant. Men should not father a child while taking this medicine and for 3 months after stopping it. There is a potential for serious side  effects to an unborn child. Talk to your health care professional or pharmacist for more information. Do not breast-feed an infant while taking this medicine or for at least 1 week after stopping it. Men should inform their doctors if they wish to father a child. This medicine may lower sperm counts. Talk with your doctor or health care professional if you are concerned about your fertility. What side effects may I notice from receiving this medicine? Side effects that you should report to your doctor or health care professional as soon as possible: -allergic  reactions like skin rash, itching or hives, swelling of the face, lips, or tongue -breathing problems -pain, redness, or irritation at site where injected -signs and symptoms of a dangerous change in heartbeat or heart rhythm like chest pain; dizziness; fast or irregular heartbeat; palpitations; feeling faint or lightheaded, falls; breathing problems -signs of decreased platelets or bleeding - bruising, pinpoint red spots on the skin, black, tarry stools, blood in the urine -signs of decreased red blood cells - unusually weak or tired, feeling faint or lightheaded, falls -signs of infection - fever or chills, cough, sore throat, pain or difficulty passing urine -signs and symptoms of kidney injury like trouble passing urine or change in the amount of urine -signs and symptoms of liver injury like dark yellow or brown urine; general ill feeling or flu-like symptoms; light-colored stools; loss of appetite; nausea; right upper belly pain; unusually weak or tired; yellowing of the eyes or skin -swelling of ankles, feet, hands Side effects that usually do not require medical attention (report to your doctor or health care professional if they continue or are bothersome): -constipation -diarrhea -hair loss -loss of appetite -nausea -rash -vomiting This list may not describe all possible side effects. Call your doctor for medical advice about side effects. You may report side effects to FDA at 1-800-FDA-1088. Where should I keep my medicine? This drug is given in a hospital or clinic and will not be stored at home. NOTE: This sheet is a summary. It may not cover all possible information. If you have questions about this medicine, talk to your doctor, pharmacist, or health care provider.  2019 [employer]/Gold Standard (2017-11-03 18:06:11)

## 2018-09-13 NOTE — Progress Notes (Signed)
Per Dr. Burr Medico okay to treat with Hbg 7.8. Notified Scientist, clinical (histocompatibility and immunogenetics) Infusion

## 2018-09-14 ENCOUNTER — Inpatient Hospital Stay: Payer: 59

## 2018-09-14 ENCOUNTER — Other Ambulatory Visit: Payer: Self-pay | Admitting: Emergency Medicine

## 2018-09-14 ENCOUNTER — Other Ambulatory Visit: Payer: Self-pay

## 2018-09-14 DIAGNOSIS — D6481 Anemia due to antineoplastic chemotherapy: Secondary | ICD-10-CM

## 2018-09-14 DIAGNOSIS — T451X5A Adverse effect of antineoplastic and immunosuppressive drugs, initial encounter: Principal | ICD-10-CM

## 2018-09-14 DIAGNOSIS — D649 Anemia, unspecified: Secondary | ICD-10-CM

## 2018-09-14 DIAGNOSIS — Z5111 Encounter for antineoplastic chemotherapy: Secondary | ICD-10-CM | POA: Diagnosis not present

## 2018-09-14 LAB — PREPARE RBC (CROSSMATCH)

## 2018-09-14 MED ORDER — SODIUM CHLORIDE 0.9% FLUSH
10.0000 mL | INTRAVENOUS | Status: AC | PRN
  Administered 2018-09-14: 10 mL
  Filled 2018-09-14: qty 10

## 2018-09-14 MED ORDER — SODIUM CHLORIDE 0.9% IV SOLUTION
250.0000 mL | Freq: Once | INTRAVENOUS | Status: AC
  Administered 2018-09-14: 250 mL via INTRAVENOUS
  Filled 2018-09-14: qty 250

## 2018-09-14 MED ORDER — DIPHENHYDRAMINE HCL 25 MG PO CAPS
25.0000 mg | ORAL_CAPSULE | Freq: Once | ORAL | Status: AC
  Administered 2018-09-14: 25 mg via ORAL

## 2018-09-14 MED ORDER — ACETAMINOPHEN 325 MG PO TABS
650.0000 mg | ORAL_TABLET | Freq: Once | ORAL | Status: AC
  Administered 2018-09-14: 650 mg via ORAL

## 2018-09-14 MED ORDER — DIPHENHYDRAMINE HCL 25 MG PO CAPS
ORAL_CAPSULE | ORAL | Status: AC
  Filled 2018-09-14: qty 1

## 2018-09-14 MED ORDER — ACETAMINOPHEN 325 MG PO TABS
ORAL_TABLET | ORAL | Status: AC
  Filled 2018-09-14: qty 2

## 2018-09-14 MED ORDER — HEPARIN SOD (PORK) LOCK FLUSH 100 UNIT/ML IV SOLN
500.0000 [IU] | Freq: Every day | INTRAVENOUS | Status: AC | PRN
  Administered 2018-09-14: 500 [IU]
  Filled 2018-09-14: qty 5

## 2018-09-14 NOTE — Patient Instructions (Signed)

## 2018-09-15 LAB — BPAM RBC
BLOOD PRODUCT EXPIRATION DATE: 202002102359
ISSUE DATE / TIME: 202001221331
Unit Type and Rh: 6200

## 2018-09-15 LAB — TYPE AND SCREEN
ABO/RH(D): A POS
Antibody Screen: NEGATIVE
Unit division: 0

## 2018-09-16 ENCOUNTER — Ambulatory Visit: Payer: 59

## 2018-09-16 ENCOUNTER — Other Ambulatory Visit: Payer: 59

## 2018-09-16 ENCOUNTER — Ambulatory Visit: Payer: 59 | Admitting: Hematology

## 2018-09-16 NOTE — Progress Notes (Signed)
[place]   [phone] [phone]   Clinic Follow up Note   Patient Care Team: [name], MD as PCP - General (Family Medicine) [name], MD as Consulting Physician (Otolaryngology)  Date of Service:  09/19/2018  CHIEF COMPLAINT: F/u of pancreatic cancer  SUMMARY OF ONCOLOGIC HISTORY: Oncology History   Cancer Staging Pancreatic cancer [facility]) Staging form: Exocrine Pancreas, AJCC 8th Edition - Clinical stage from 08/06/2017: Stage IV (cTX, cN0, pM1) - Signed by [name], MD on 08/12/2017       Pancreatic cancer ([place])   07/23/2017 Imaging    US abdomen limited RUQ 07/23/17 IMPRESSION: 1. Cholelithiasis.  No secondary signs of acute cholecystitis. 2. Multiple solid liver masses measuring up to 6.5 cm in the left lobe of the liver, evaluation for metastatic disease is recommended. These results will be called to the ordering clinician or representative by the Radiologist Assistant, and communication documented in the PACS or zVision Dashboard.    07/23/2017 Imaging    CT Abdomen W Contrast 07/23/17 IMPRESSION: 1. Widespread metastatic disease throughout the liver. No clear primary malignancy identified in the abdomen. The pelvis was not imaged. Tissue sampling recommended. 2. Probable adenopathy superior to the pancreatic tail. No evidence of pancreatic mass. 3. Suspected incidental hemangioma inferiorly in the right hepatic lobe. 4. Nonspecific nodularity in the breasts. The patient has undergone recent (03/25/2017 and 04/01/2017) mammography and ultrasound.    07/29/2017 Initial Diagnosis    Metastasis to liver of unknown origin ([place])    07/29/2017 PET scan    PET 07/29/17  IMPRESSION: 1. Numerous bulky liver masses are hypermetabolic compatible with malignancy. 2. Accentuated activity within or along the pancreatic tail, likely represent a primary pancreatic tumor. Consider pancreatic protocol MRI to further work  this up. 3. The peripancreatic lymph node shown above the pancreatic tail is mildly hypermetabolic favoring malignancy. 4.  Prominent stool throughout the colon favors constipation. 5. 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    08/05/2017 Pathology Results    Liver Biopsy  Diagnosis 08/05/17 Liver, needle/core biopsy - CARCINOMA. - SEE COMMENT. Microscopic Comment The malignant cells are positive for cytokeratin 7. They are negative for arginase, CDX2, cytokeratin 20, estrogen receptor, GATA-3, GCDFP, Glypican 3, Hep Par 1, Napsin A, and TTF-1. This immunohistochemical is nonspecific. Possibly primary sources include pancreatobiliary and upper gastrointestinal. Radiologic correlation is necessary. Of note, organ specific markers (GATA-3, GCDFP-breast, TTF-1, Napsin A-lung, and CDX2-colon) are negative. (JBK:ecj 08/09/2017)    08/21/2017 - 02/10/2018 Chemotherapy    FOLFIRINOX every 2 weeks starting 08/21/17. Dose reduced due to neuropahty and cytopenia     10/14/2017 Imaging    CT CAP WO Contrast 10/14/17 IMPRESSION: Evidence of known numerous liver metastases without significant interval change. No other evidence of metastatic disease within the chest, abdomen or pelvis. Cholelithiasis. Tiny pericardial effusion.    12/02/2017 Genetic Testing    Negative for pathogenic mutation.  The genes analyzed were the 83 genes on [facility]'s Multi-Cancer panel (ALK, APC, ATM, AXIN2, BAP1, BARD1, BLM, BMPR1A, BRCA1, BRCA2, BRIP1, CASR, CDC73, CDH1, CDK4, CDKN1B, CDKN1C, CDKN2A, CEBPA, CHEK2, CTNNA1, DICER1, DIS3L2, EGFR, EPCAM, FH, FLCN, GATA2, GPC3, GREM1, HOXB13, HRAS, KIT, MAX, MEN1, MET, MITF, MLH1, MSH2, MSH3, MSH6, MUTYH, NBN, NF1, NF2, NTHL1, PALB2, PDGFRA, PHOX2B, PMS2, POLD1, POLE, POT1, PRKAR1A, PTCH1, PTEN, RAD50, RAD51C, RAD51D, RB1, RECQL4, RET, RUNX1, SDHA, SDHAF2, SDHB, SDHC, SDHD, SMAD4, SMARCA4, SMARCB1, SMARCE1, STK11, SUFU, TERC, TERT, TMEM127, TP53, TSC1, TSC2, VHL, WRN, WT1).  12/17/2017 PET scan    IMPRESSION: 1. Although the liver metastases are still visible on the CT images, they are significantly smaller and retain no abnormal metabolic activity, consistent with response to therapy. 2. No abnormal activity within the pancreas. 3. No disease progression identified. 4. Cholelithiasis.    02/21/2018 PET scan    02/21/2018 PET Scan  IMPRESSION: 1. There are 2 new small nodules identified within the right lung which measure up to 5 mm. These are too small to reliably characterize by PET-CT, but warrant close interval follow-up. 2. Again noted are multifocal liver metastasis. The target lesion within the left lobe is slightly decreased in size when compared with the previous exam. Similar to previous exam there is no abnormal hypermetabolism above background liver activity identified within the liver lesions. 3. Gallstones.    02/22/2018 - 08/25/2018 Chemotherapy    5-FU pump infusion and liposomal irinotecan, every 2 weeks.  First cycle dose reduced due to cytopenia in the travel. Stopped on 08/25/2018 due to disease progression.     05/17/2018 Imaging    05/17/2018 PET Scan IMPRESSION: 1. Mixed response. The 2 small right lung nodules have decreased in size in the interval. Single focus of increased uptake within the liver is new from 02/21/2018 and concerning for recurrent metabolically active liver metastasis. 2. Splenomegaly.  New from previous exam. 3. Diffusely increased bone marrow activity, likely reflecting treatment related changes.    09/01/2018 PET scan    PET 09/01/18 IMPRESSION: 1. Unfortunately there recurrence of hepatic metastasis with multiple new hypermetabolic lesions in LEFT and RIGHT hepatic lobe. 2. New extra hepatic site of malignancy which appears associated the mid pancreas. Difficult to define lesion on noncontrast exam. 3. Diffuse marrow activity is favored benign. 4. No evidence of pulmonary metastasis.    09/12/2018 -  Chemotherapy     second-line Gemcitabine and Abraxane for 2 weeks on, 1 week off starting 09/12/2018. Due to neuropathy, I will start her with dose reduced Abraxane.      Pancreatic cancer metastasized to liver ([place])   08/11/2017 Initial Diagnosis    Pancreatic cancer metastasized to liver ([place])    09/12/2018 -  Chemotherapy    second-line Gemcitabine and Abraxane for 2 weeks on, 1 week off starting 09/12/2018. Due to neuropathy, I will start her with dose reduced Abraxane.       CURRENT THERAPY:  Second-line Gemcitabine and Abraxane for 2 weeks on, 1 week off starting 09/12/2018  INTERVAL HISTORY:  Morgan Dawson is here for a follow up of after week 1 Gem/Cis. She presents to the clinic today by herself. She notes she tolerated well overall. She plans to see Dr. Sharin Mons for acupuncture for GI and neuropathy. She notes her hernia is getting larger and more noticeable. At times she will have stabbing pain over that area. When using there restroom she hold that area in because she feels the protrusion. She has had this hernia for 6-8 months. She notes she had 2 episodes of vertigo which she has not had in years. She was moving from laying down to get up and walk. She had to sit down soon after. This lasted 8 hours and she had nausea with emesis. Her second episode lasted 5 hours. This resolved and she took meclizine.      REVIEW OF SYSTEMS:   Constitutional: Denies fevers, chills or abnormal weight loss Eyes: Denies blurriness of vision Ears, nose, mouth, throat, and face: Denies mucositis or sore throat (+) Vertigo  Respiratory: Denies cough, dyspnea or wheezes Cardiovascular: Denies palpitation, chest discomfort or lower extremity swelling Gastrointestinal:  Denies nausea, heartburn or change in bowel habits (+) LLQ inguinal  hernia  Skin: Denies abnormal skin rashes Lymphatics: Denies new lymphadenopathy or easy bruising Neurological:Denies numbness, tingling or new weaknesses Behavioral/Psych: Mood is  stable, no new changes  All other systems were reviewed with the patient and are negative.  MEDICAL HISTORY:  Past Medical History:  Diagnosis Date  . Genetic testing 12/02/2017   Multi-Cancer panel (83 genes) @ [facility] - No pathogenic mutations detected  . Meniere disease 2016  . Pancreatitis     SURGICAL HISTORY: Past Surgical History:  Procedure Laterality Date  . CERVICAL ABLATION    . ENDOMETRIAL ABLATION  2011  . IR FLUORO GUIDE PORT INSERTION RIGHT  08/12/2017  . IR US GUIDE VASC ACCESS RIGHT  08/12/2017  . [place]    I have reviewed the social history and family history with the patient and they are unchanged from previous note.  ALLERGIES:  has No Known Allergies.  MEDICATIONS:  Current Outpatient Medications  Medication Sig Dispense Refill  . gabapentin (NEURONTIN) 100 MG capsule Take 1 capsule (100 mg total) by mouth 3 (three) times daily. 90 capsule 2  . loperamide (IMODIUM) 2 MG capsule Take 1 capsule (2 mg total) by mouth as needed for diarrhea or loose stools. 30 capsule 0  . loratadine (CLARITIN) 10 MG tablet Take 10 mg by mouth daily as needed for allergies.    . magic mouthwash w/lidocaine SOLN Take 10 mLs by mouth 3 (three) times daily as needed for mouth pain. Swish and spit 10 ML by mouth 3 times daily as needed for mouth pain. 240 mL 0  . meclizine (ANTIVERT) 25 MG tablet Take 25 mg by mouth 3 (three) times daily as needed for dizziness.    . ondansetron (ZOFRAN-ODT) 8 MG disintegrating tablet TAKE 1 TABLET BY MOUTH EVERY 8 HOURS AS NEEDED FOR NAUSE OR VOMITING 40 tablet 0  . potassium chloride (K-DUR) 10 MEQ tablet Take 1 tablet (10 mEq total) by mouth 4 (four) times daily. 360 tablet 2  . prochlorperazine (COMPAZINE) 10 MG tablet Take 1 tablet (10 mg total) by mouth every 6 (six) hours as needed for nausea or vomiting. 40 tablet 2  . traMADol (ULTRAM) 50 MG tablet Take 1 tablet (50 mg total) by mouth every 6 (six) hours as needed. 30 tablet  0   No current facility-administered medications for this visit.     PHYSICAL EXAMINATION: ECOG PERFORMANCE STATUS: 1 - Symptomatic but completely ambulatory  Vitals:   09/19/18 1501  BP: 114/69  Pulse: 87  Resp: 18  Temp: 98.2 F (36.8 C)  SpO2: 100%   Filed Weights   09/19/18 1501  Weight: 99 lb 4.8 oz (45 kg)    GENERAL:alert, no distress and comfortable SKIN: skin color, texture, turgor are normal, no rashes or significant lesions EYES: normal, Conjunctiva are pink and non-injected, sclera clear OROPHARYNX:no exudate, no erythema and lips, buccal mucosa, and tongue normal  NECK: supple, thyroid normal size, non-tender, without nodularity LYMPH:  no palpable lymphadenopathy in the cervical, axillary or inguinal LUNGS: clear to auscultation and percussion with normal breathing effort HEART: regular rate & rhythm and no murmurs and no lower extremity edema ABDOMEN:abdomen soft, non-tender and normal bowel sounds Musculoskeletal:no cyanosis of digits and no clubbing  NEURO: alert & oriented x 3 with fluent speech, no focal motor/sensory deficits  LABORATORY  DATA:  I have reviewed the data as listed CBC Latest Ref Rng & Units 09/19/2018 09/13/2018 09/05/2018  WBC 4.0 - 10.5 K/uL 5.5 13.4(H) 14.4(H)  Hemoglobin 12.0 - 15.0 g/dL 10.1(L) 7.8(L) 8.2(L)  Hematocrit 36.0 - 46.0 % 31.3(L) 24.0(L) 25.5(L)  Platelets 150 - 400 K/uL 161 210 211     CMP Latest Ref Rng & Units 09/19/2018 09/13/2018 09/05/2018  Glucose 70 - 99 mg/dL 117(H) 123(H) 115(H)  BUN 6 - 20 mg/dL 17 20 16   Creatinine 0.44 - 1.00 mg/dL 1.08(H) 1.02(H) 1.09(H)  Sodium 135 - 145 mmol/L 140 135 138  Potassium 3.5 - 5.1 mmol/L 3.6 3.8 3.7  Chloride 98 - 111 mmol/L 102 99 103  CO2 22 - 32 mmol/L 28 27 27   Calcium 8.9 - 10.3 mg/dL 10.1 10.1 9.4  Total Protein 6.5 - 8.1 g/dL 8.1 7.7 7.5  Total Bilirubin 0.3 - 1.2 mg/dL 1.2 1.4(H) 0.6  Alkaline Phos 38 - 126 U/L 624(H) 535(H) 596(H)  AST 15 - 41 U/L 90(H) 56(H)  52(H)  ALT 0 - 44 U/L 57(H) 41 42      RADIOGRAPHIC STUDIES: I have personally reviewed the radiological images as listed and agreed with the findings in the report. No results found.   ASSESSMENT & PLAN:  Morgan Dawson is a 58 y.o. female with   1. 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 Treated with chemo.She was treated withfirst linechemoFOLFIRINOX with dose reduction. Due to neuropathyand cytopenia, chemo changed tosecond line 5-FU pump infusion and liposomal irinotecan every 2 weekswith dose reductiondue to cytopenia. -Unfortunately she recently had disease progression with recurrence of liver mets.  -She started second-line Gemcitabine and Abraxane on 09/12/18. She tolerated dose reduced first week well. Proceed with chemo tomorrow  -Labs revewied, Hg at 10.1, glucose at 117, Ct at 1.08, Mildly elevated LFTs, overall stable  -F/u in 3 weeks.   2. Weight loss and malnutrition -Due to chemo and underlying metastatic cancer. -Continue nutritional supplement and f/u with dietician. -Weight remains stable   3. Transaminitis -Due to underlying chemo and malignancy -Stable and mild overall   4. Pancytopenia, secondary to chemo -Required chemo dose reduction. -S/p blood transfusion as needed with gh <8. Last on 08/10/18 -Labs reviewed,WBC at 5.5, Hg at 10.1, PLT at 161K today (09/19/18)   5. Hypokalemia, CKD Stage III -She is currently on KCL supplements -Potassium has been normal lately. Cr improving, at 1.08 today (09/19/2018)  6.Peripheral neuropathy, grade 2 -Currently on Neurontin 200 mg at night and B complexes. Continue.  -She agreed to use ice bags with Abraxane infusion  7.Goal of care discussion -She is full code now -She understands her cancer is incurable at this stage, and the goal of therapy is palliative, to prolong his life, and prevent cancer related symptoms.  8. Inguinal  hernia  -Pt notes this is increasing and being more protruded.  -I encouraged her to not lift anything too heavy  -I discussed given her cancer, surgeon will likely not proceed with hernia repair. She understands.   9. Vertigo  -She has a h/o of vertigo w/o episode in the past 1.5 years.  -She had 2 episodes, 1 with nausea and emesis last week -She has left over Meclozine which she took. I recommend she see Dr. Wilburn Cornelia about restarting medication.    Plan  -Labs reviewed, mild transaminitis is stable, anemia improved, adequate to proceed with Gem/Abraxane tomorrow, off chemo next week  -  Lab, flush, f/u with me and chemo in 3 weeks, she will see APP before chemo in 2 weeks     No problem-specific Assessment & Plan notes found for this encounter.   No orders of the defined types were placed in this encounter.  All questions were answered. The patient knows to call the clinic with any problems, questions or concerns. No barriers to learning was detected. I spent 20 minutes counseling the patient face to face. The total time spent in the appointment was 25 minutes and more than 50% was on counseling and review of test results     [name], MD 09/19/2018   I, Joslyn Devon, am acting as scribe for [name], MD.   I have reviewed the above documentation for accuracy and completeness, and I agree with the above.

## 2018-09-17 ENCOUNTER — Inpatient Hospital Stay: Payer: 59

## 2018-09-17 DIAGNOSIS — D649 Anemia, unspecified: Secondary | ICD-10-CM

## 2018-09-17 DIAGNOSIS — Z5111 Encounter for antineoplastic chemotherapy: Secondary | ICD-10-CM | POA: Diagnosis not present

## 2018-09-17 MED ORDER — SODIUM CHLORIDE 0.9% FLUSH
10.0000 mL | INTRAVENOUS | Status: AC | PRN
  Administered 2018-09-17: 10 mL
  Filled 2018-09-17: qty 10

## 2018-09-17 MED ORDER — DIPHENHYDRAMINE HCL 25 MG PO CAPS
ORAL_CAPSULE | ORAL | Status: AC
  Filled 2018-09-17: qty 1

## 2018-09-17 MED ORDER — SODIUM CHLORIDE 0.9% IV SOLUTION
250.0000 mL | Freq: Once | INTRAVENOUS | Status: AC
  Administered 2018-09-17: 250 mL via INTRAVENOUS
  Filled 2018-09-17: qty 250

## 2018-09-17 MED ORDER — HEPARIN SOD (PORK) LOCK FLUSH 100 UNIT/ML IV SOLN
500.0000 [IU] | Freq: Every day | INTRAVENOUS | Status: AC | PRN
  Administered 2018-09-17: 500 [IU]
  Filled 2018-09-17: qty 5

## 2018-09-17 MED ORDER — ACETAMINOPHEN 325 MG PO TABS: 650.0000 mg | ORAL_TABLET | Freq: Once | ORAL | Status: DC

## 2018-09-17 MED ORDER — DIPHENHYDRAMINE HCL 25 MG PO CAPS
25.0000 mg | ORAL_CAPSULE | Freq: Once | ORAL | Status: AC
  Administered 2018-09-17: 25 mg via ORAL

## 2018-09-17 NOTE — Patient Instructions (Signed)

## 2018-09-19 ENCOUNTER — Inpatient Hospital Stay: Payer: 59

## 2018-09-19 ENCOUNTER — Encounter: Payer: Self-pay | Admitting: Hematology

## 2018-09-19 ENCOUNTER — Inpatient Hospital Stay (HOSPITAL_BASED_OUTPATIENT_CLINIC_OR_DEPARTMENT_OTHER): Payer: 59 | Admitting: Hematology

## 2018-09-19 VITALS — BP 114/69 | HR 87 | Temp 98.2°F | Resp 18 | Ht 65.0 in | Wt 99.3 lb

## 2018-09-19 DIAGNOSIS — R740 Nonspecific elevation of levels of transaminase and lactic acid dehydrogenase [LDH]: Secondary | ICD-10-CM

## 2018-09-19 DIAGNOSIS — D6481 Anemia due to antineoplastic chemotherapy: Secondary | ICD-10-CM

## 2018-09-19 DIAGNOSIS — C787 Secondary malignant neoplasm of liver and intrahepatic bile duct: Secondary | ICD-10-CM | POA: Diagnosis not present

## 2018-09-19 DIAGNOSIS — K4090 Unilateral inguinal hernia, without obstruction or gangrene, not specified as recurrent: Secondary | ICD-10-CM

## 2018-09-19 DIAGNOSIS — C259 Malignant neoplasm of pancreas, unspecified: Secondary | ICD-10-CM

## 2018-09-19 DIAGNOSIS — N183 Chronic kidney disease, stage 3 unspecified: Secondary | ICD-10-CM

## 2018-09-19 DIAGNOSIS — E876 Hypokalemia: Secondary | ICD-10-CM

## 2018-09-19 DIAGNOSIS — Z9221 Personal history of antineoplastic chemotherapy: Secondary | ICD-10-CM

## 2018-09-19 DIAGNOSIS — C251 Malignant neoplasm of body of pancreas: Secondary | ICD-10-CM

## 2018-09-19 DIAGNOSIS — D61810 Antineoplastic chemotherapy induced pancytopenia: Secondary | ICD-10-CM

## 2018-09-19 DIAGNOSIS — G629 Polyneuropathy, unspecified: Secondary | ICD-10-CM

## 2018-09-19 DIAGNOSIS — E46 Unspecified protein-calorie malnutrition: Secondary | ICD-10-CM | POA: Diagnosis not present

## 2018-09-19 DIAGNOSIS — T451X5A Adverse effect of antineoplastic and immunosuppressive drugs, initial encounter: Secondary | ICD-10-CM

## 2018-09-19 DIAGNOSIS — Z95828 Presence of other vascular implants and grafts: Secondary | ICD-10-CM

## 2018-09-19 DIAGNOSIS — Z5111 Encounter for antineoplastic chemotherapy: Secondary | ICD-10-CM | POA: Diagnosis not present

## 2018-09-19 DIAGNOSIS — R42 Dizziness and giddiness: Secondary | ICD-10-CM

## 2018-09-19 LAB — CBC WITH DIFFERENTIAL/PLATELET
Abs Immature Granulocytes: 0.03 10*3/uL (ref 0.00–0.07)
BASOS ABS: 0 10*3/uL (ref 0.0–0.1)
Basophils Relative: 0 %
Eosinophils Absolute: 0 10*3/uL (ref 0.0–0.5)
Eosinophils Relative: 0 %
HCT: 31.3 % — ABNORMAL LOW (ref 36.0–46.0)
Hemoglobin: 10.1 g/dL — ABNORMAL LOW (ref 12.0–15.0)
IMMATURE GRANULOCYTES: 1 %
Lymphocytes Relative: 11 %
Lymphs Abs: 0.6 10*3/uL — ABNORMAL LOW (ref 0.7–4.0)
MCH: 30.9 pg (ref 26.0–34.0)
MCHC: 32.3 g/dL (ref 30.0–36.0)
MCV: 95.7 fL (ref 80.0–100.0)
Monocytes Absolute: 0.1 10*3/uL (ref 0.1–1.0)
Monocytes Relative: 2 %
Neutro Abs: 4.8 10*3/uL (ref 1.7–7.7)
Neutrophils Relative %: 86 %
Platelets: 161 10*3/uL (ref 150–400)
RBC: 3.27 MIL/uL — AB (ref 3.87–5.11)
RDW: 16.1 % — ABNORMAL HIGH (ref 11.5–15.5)
WBC: 5.5 10*3/uL (ref 4.0–10.5)
nRBC: 0 % (ref 0.0–0.2)

## 2018-09-19 LAB — COMPREHENSIVE METABOLIC PANEL
ALT: 57 U/L — ABNORMAL HIGH (ref 0–44)
ANION GAP: 10 (ref 5–15)
AST: 90 U/L — ABNORMAL HIGH (ref 15–41)
Albumin: 2.7 g/dL — ABNORMAL LOW (ref 3.5–5.0)
Alkaline Phosphatase: 624 U/L — ABNORMAL HIGH (ref 38–126)
BUN: 17 mg/dL (ref 6–20)
CO2: 28 mmol/L (ref 22–32)
Calcium: 10.1 mg/dL (ref 8.9–10.3)
Chloride: 102 mmol/L (ref 98–111)
Creatinine, Ser: 1.08 mg/dL — ABNORMAL HIGH (ref 0.44–1.00)
GFR calc Af Amer: >60 mL/min (ref >60)
GFR calc non Af Amer: 58 mL/min — ABNORMAL LOW (ref >60)
Glucose, Bld: 117 mg/dL — ABNORMAL HIGH (ref 70–99)
Potassium: 3.6 mmol/L (ref 3.5–5.1)
Sodium: 140 mmol/L (ref 135–145)
Total Bilirubin: 1.2 mg/dL (ref 0.3–1.2)
Total Protein: 8.1 g/dL (ref 6.5–8.1)

## 2018-09-19 LAB — SAMPLE TO BLOOD BANK

## 2018-09-19 LAB — BPAM RBC
Blood Product Expiration Date: 202002122359
Blood Product Expiration Date: 202002222359
ISSUE DATE / TIME: 202001250828
ISSUE DATE / TIME: 202001250842
UNIT TYPE AND RH: 6200
Unit Type and Rh: 6200

## 2018-09-19 LAB — TYPE AND SCREEN
ABO/RH(D): A POS
ANTIBODY SCREEN: NEGATIVE
Unit division: 0
Unit division: 0

## 2018-09-19 MED ORDER — SODIUM CHLORIDE 0.9% FLUSH
10.0000 mL | Freq: Once | INTRAVENOUS | Status: DC
  Filled 2018-09-19: qty 10

## 2018-09-19 MED ORDER — HEPARIN SOD (PORK) LOCK FLUSH 100 UNIT/ML IV SOLN
500.0000 [IU] | Freq: Once | INTRAVENOUS | Status: DC
  Filled 2018-09-19: qty 5

## 2018-09-19 NOTE — Progress Notes (Signed)
Pt did not want port accessed and flushed she wanted to have her labs drawn from her arm. Sent Pt to lab to get lab work done.

## 2018-09-20 ENCOUNTER — Telehealth: Payer: Self-pay | Admitting: Hematology

## 2018-09-20 ENCOUNTER — Inpatient Hospital Stay: Payer: 59

## 2018-09-20 VITALS — BP 114/74 | HR 80 | Temp 98.4°F | Resp 16

## 2018-09-20 DIAGNOSIS — Z5111 Encounter for antineoplastic chemotherapy: Secondary | ICD-10-CM | POA: Diagnosis not present

## 2018-09-20 DIAGNOSIS — C251 Malignant neoplasm of body of pancreas: Secondary | ICD-10-CM

## 2018-09-20 DIAGNOSIS — C259 Malignant neoplasm of pancreas, unspecified: Secondary | ICD-10-CM

## 2018-09-20 DIAGNOSIS — Z7189 Other specified counseling: Secondary | ICD-10-CM

## 2018-09-20 DIAGNOSIS — C787 Secondary malignant neoplasm of liver and intrahepatic bile duct: Principal | ICD-10-CM

## 2018-09-20 LAB — CANCER ANTIGEN 19-9: CA 19-9: 41 U/mL — ABNORMAL HIGH (ref 0–35)

## 2018-09-20 MED ORDER — SODIUM CHLORIDE 0.9 % IV SOLN
1000.0000 mg/m2 | Freq: Once | INTRAVENOUS | Status: AC
  Administered 2018-09-20: 1444 mg via INTRAVENOUS
  Filled 2018-09-20: qty 37.98

## 2018-09-20 MED ORDER — SODIUM CHLORIDE 0.9% FLUSH
10.0000 mL | INTRAVENOUS | Status: DC | PRN
  Administered 2018-09-20: 10 mL
  Filled 2018-09-20: qty 10

## 2018-09-20 MED ORDER — PACLITAXEL PROTEIN-BOUND CHEMO INJECTION 100 MG: 125.0000 mg/m2 | Freq: Once | Status: DC

## 2018-09-20 MED ORDER — PROCHLORPERAZINE MALEATE 10 MG PO TABS
10.0000 mg | ORAL_TABLET | Freq: Once | ORAL | Status: AC
  Administered 2018-09-20: 10 mg via ORAL

## 2018-09-20 MED ORDER — PROCHLORPERAZINE MALEATE 10 MG PO TABS
ORAL_TABLET | ORAL | Status: AC
  Filled 2018-09-20: qty 1

## 2018-09-20 MED ORDER — PACLITAXEL PROTEIN-BOUND CHEMO INJECTION 100 MG
125.0000 mg/m2 | Freq: Once | INTRAVENOUS | Status: AC
  Administered 2018-09-20: 175 mg via INTRAVENOUS
  Filled 2018-09-20: qty 35

## 2018-09-20 MED ORDER — SODIUM CHLORIDE 0.9 % IV SOLN
Freq: Once | INTRAVENOUS | Status: AC
  Administered 2018-09-20: 09:00:00 via INTRAVENOUS
  Filled 2018-09-20: qty 250

## 2018-09-20 MED ORDER — HEPARIN SOD (PORK) LOCK FLUSH 100 UNIT/ML IV SOLN
500.0000 [IU] | Freq: Once | INTRAVENOUS | Status: AC | PRN
  Administered 2018-09-20: 500 [IU]
  Filled 2018-09-20: qty 5

## 2018-09-20 NOTE — Progress Notes (Signed)
Per Dr. Ernestina Penna note from 09/19/2018   "Plan -Labs reviewed, mild transaminitis is stable, anemia improved, adequate to proceed with Gem/Abraxane tomorrow, off chemo next week  -Lab, flush, f/u with me and chemo in 3 weeks, she will see APP before chemo in 2 weeks "

## 2018-09-20 NOTE — Telephone Encounter (Signed)
Scheduled appt per 01/27 los.

## 2018-09-20 NOTE — Patient Instructions (Signed)
[facility] Discharge Instructions for Patients Receiving Chemotherapy  Today you received the following chemotherapy agents Abraxane, Gemzar  To help prevent nausea and vomiting after your treatment, we encourage you to take your nausea medication as prescribed.   If you develop nausea and vomiting that is not controlled by your nausea medication, call the clinic.   BELOW ARE SYMPTOMS THAT SHOULD BE REPORTED IMMEDIATELY:  *FEVER GREATER THAN 100.5 F  *CHILLS WITH OR WITHOUT FEVER  NAUSEA AND VOMITING THAT IS NOT CONTROLLED WITH YOUR NAUSEA MEDICATION  *UNUSUAL SHORTNESS OF BREATH  *UNUSUAL BRUISING OR BLEEDING  TENDERNESS IN MOUTH AND THROAT WITH OR WITHOUT PRESENCE OF ULCERS  *URINARY PROBLEMS  *BOWEL PROBLEMS  UNUSUAL RASH Items with * indicate a potential emergency and should be followed up as soon as possible.  Feel free to call the clinic should you have any questions or concerns. The clinic phone number is [phone].  Please show the CHEMO ALERT CARD at check-in to the Emergency Department and triage nurse.   

## 2018-09-22 ENCOUNTER — Ambulatory Visit: Payer: 59 | Admitting: Hematology

## 2018-09-22 ENCOUNTER — Other Ambulatory Visit: Payer: 59

## 2018-09-23 ENCOUNTER — Ambulatory Visit: Payer: 59

## 2018-09-26 ENCOUNTER — Telehealth: Payer: Self-pay

## 2018-09-26 NOTE — Telephone Encounter (Signed)
Faxed script for Gabapentin (90 day supply) to [place], Renie Ora, sent to HIM for scanning to chart.

## 2018-09-30 NOTE — Progress Notes (Signed)
[place]   [phone] [phone]) [phone]   Clinic Follow up Note   Patient Care Team: [name], MD as PCP - General (Family Medicine) [name], MD as Consulting Physician (Otolaryngology)  Date of Service:  10/03/2018  CHIEF COMPLAINT: F/u of pancreatic cancer  SUMMARY OF ONCOLOGIC HISTORY: Oncology History   Cancer Staging Pancreatic cancer [facility]) Staging form: Exocrine Pancreas, AJCC 8th Edition - Clinical stage from 08/06/2017: Stage IV (cTX, cN0, pM1) - Signed by [name], MD on 08/12/2017       Pancreatic cancer ([place])   07/23/2017 Imaging    US abdomen limited RUQ 07/23/17 IMPRESSION: 1. Cholelithiasis.  No secondary signs of acute cholecystitis. 2. Multiple solid liver masses measuring up to 6.5 cm in the left lobe of the liver, evaluation for metastatic disease is recommended. These results will be called to the ordering clinician or representative by the Radiologist Assistant, and communication documented in the PACS or zVision Dashboard.    07/23/2017 Imaging    CT Abdomen W Contrast 07/23/17 IMPRESSION: 1. Widespread metastatic disease throughout the liver. No clear primary malignancy identified in the abdomen. The pelvis was not imaged. Tissue sampling recommended. 2. Probable adenopathy superior to the pancreatic tail. No evidence of pancreatic mass. 3. Suspected incidental hemangioma inferiorly in the right hepatic lobe. 4. Nonspecific nodularity in the breasts. The patient has undergone recent (03/25/2017 and 04/01/2017) mammography and ultrasound.    07/29/2017 Initial Diagnosis    Metastasis to liver of unknown origin ([place])    07/29/2017 PET scan    PET 07/29/17  IMPRESSION: 1. Numerous bulky liver masses are hypermetabolic compatible with malignancy. 2. Accentuated activity within or along the pancreatic tail, likely represent a primary pancreatic tumor. Consider pancreatic protocol MRI to further work  this up. 3. The peripancreatic lymph node shown above the pancreatic tail is mildly hypermetabolic favoring malignancy. 4.  Prominent stool throughout the colon favors constipation. 5. 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    08/05/2017 Pathology Results    Liver Biopsy  Diagnosis 08/05/17 Liver, needle/core biopsy - CARCINOMA. - SEE COMMENT. Microscopic Comment The malignant cells are positive for cytokeratin 7. They are negative for arginase, CDX2, cytokeratin 20, estrogen receptor, GATA-3, GCDFP, Glypican 3, Hep Par 1, Napsin A, and TTF-1. This immunohistochemical is nonspecific. Possibly primary sources include pancreatobiliary and upper gastrointestinal. Radiologic correlation is necessary. Of note, organ specific markers (GATA-3, GCDFP-breast, TTF-1, Napsin A-lung, and CDX2-colon) are negative. (JBK:ecj 08/09/2017)    08/21/2017 - 02/10/2018 Chemotherapy    FOLFIRINOX every 2 weeks starting 08/21/17. Dose reduced due to neuropahty and cytopenia     10/14/2017 Imaging    CT CAP WO Contrast 10/14/17 IMPRESSION: Evidence of known numerous liver metastases without significant interval change. No other evidence of metastatic disease within the chest, abdomen or pelvis. Cholelithiasis. Tiny pericardial effusion.    12/02/2017 Genetic Testing    Negative for pathogenic mutation.  The genes analyzed were the 83 genes on [facility]'s Multi-Cancer panel (ALK, APC, ATM, AXIN2, BAP1, BARD1, BLM, BMPR1A, BRCA1, BRCA2, BRIP1, CASR, CDC73, CDH1, CDK4, CDKN1B, CDKN1C, CDKN2A, CEBPA, CHEK2, CTNNA1, DICER1, DIS3L2, EGFR, EPCAM, FH, FLCN, GATA2, GPC3, GREM1, HOXB13, HRAS, KIT, MAX, MEN1, MET, MITF, MLH1, MSH2, MSH3, MSH6, MUTYH, NBN, NF1, NF2, NTHL1, PALB2, PDGFRA, PHOX2B, PMS2, POLD1, POLE, POT1, PRKAR1A, PTCH1, PTEN, RAD50, RAD51C, RAD51D, RB1, RECQL4, RET, RUNX1, SDHA, SDHAF2, SDHB, SDHC, SDHD, SMAD4, SMARCA4, SMARCB1, SMARCE1, STK11, SUFU, TERC, TERT, TMEM127, TP53, TSC1, TSC2, VHL, WRN, WT1).  12/17/2017 PET scan    IMPRESSION: 1. Although the liver metastases are still visible on the CT images, they are significantly smaller and retain no abnormal metabolic activity, consistent with response to therapy. 2. No abnormal activity within the pancreas. 3. No disease progression identified. 4. Cholelithiasis.    02/21/2018 PET scan    02/21/2018 PET Scan  IMPRESSION: 1. There are 2 new small nodules identified within the right lung which measure up to 5 mm. These are too small to reliably characterize by PET-CT, but warrant close interval follow-up. 2. Again noted are multifocal liver metastasis. The target lesion within the left lobe is slightly decreased in size when compared with the previous exam. Similar to previous exam there is no abnormal hypermetabolism above background liver activity identified within the liver lesions. 3. Gallstones.    02/22/2018 - 08/25/2018 Chemotherapy    5-FU pump infusion and liposomal irinotecan, every 2 weeks.  First cycle dose reduced due to cytopenia in the travel. Stopped on 08/25/2018 due to disease progression.     05/17/2018 Imaging    05/17/2018 PET Scan IMPRESSION: 1. Mixed response. The 2 small right lung nodules have decreased in size in the interval. Single focus of increased uptake within the liver is new from 02/21/2018 and concerning for recurrent metabolically active liver metastasis. 2. Splenomegaly.  New from previous exam. 3. Diffusely increased bone marrow activity, likely reflecting treatment related changes.    09/01/2018 PET scan    PET 09/01/18 IMPRESSION: 1. Unfortunately there recurrence of hepatic metastasis with multiple new hypermetabolic lesions in LEFT and RIGHT hepatic lobe. 2. New extra hepatic site of malignancy which appears associated the mid pancreas. Difficult to define lesion on noncontrast exam. 3. Diffuse marrow activity is favored benign. 4. No evidence of pulmonary metastasis.    09/12/2018 -  Chemotherapy     second-line Gemcitabine and Abraxane for 2 weeks on, 1 week off starting 09/12/2018. Due to neuropathy, I will start Morgan Dawson with dose reduced Abraxane.      Pancreatic cancer metastasized to liver ([place])   08/11/2017 Initial Diagnosis    Pancreatic cancer metastasized to liver ([place])    09/12/2018 -  Chemotherapy    second-line Gemcitabine and Abraxane for 2 weeks on, 1 week off starting 09/12/2018. Due to neuropathy, I will start Morgan Dawson with dose reduced Abraxane.       CURRENT THERAPY:  Second-line Gemcitabine and Abraxane for 2 weeks on, 1 week off starting 09/12/2018  INTERVAL HISTORY:  Morgan Dawson is here for a follow up with Morgan Dawson husband. She notes she is doing well, eating well and more. She is upset she has not reach 100 pounds. She has not felt sick after treatment lately. She does not hair loss again.  She notes a larger bruise on Morgan Dawson left inner ankle.     REVIEW OF SYSTEMS:   Constitutional: Denies fevers, chills or abnormal weight loss  (+) Hair loss  Eyes: Denies blurriness of vision Ears, nose, mouth, throat, and face: Denies mucositis or sore throat Respiratory: Denies cough, dyspnea or wheezes Cardiovascular: Denies palpitation, chest discomfort or lower extremity swelling Gastrointestinal:  Denies nausea, heartburn or change in bowel habits Skin: Denies abnormal skin rash Lymphatics: Denies new lymphadenopathy or easy bruising Neurological:Denies numbness, tingling or new weaknesses Behavioral/Psych: Mood is stable, no new changes  All other systems were reviewed with the patient and are negative.  MEDICAL HISTORY:  Past Medical History:  Diagnosis Date  . Genetic testing 12/02/2017   Multi-Cancer  panel (83 genes) @ [facility] - No pathogenic mutations detected  . Meniere disease 2016  . Pancreatitis     SURGICAL HISTORY: Past Surgical History:  Procedure Laterality Date  . CERVICAL ABLATION    . ENDOMETRIAL ABLATION  2011  . IR FLUORO GUIDE PORT INSERTION  RIGHT  08/12/2017  . IR US GUIDE VASC ACCESS RIGHT  08/12/2017  . [place]    I have reviewed the social history and family history with the patient and they are unchanged from previous note.  ALLERGIES:  has No Known Allergies.  MEDICATIONS:  Current Outpatient Medications  Medication Sig Dispense Refill  . gabapentin (NEURONTIN) 100 MG capsule Take 1 capsule (100 mg total) by mouth 3 (three) times daily. 90 capsule 2  . loperamide (IMODIUM) 2 MG capsule Take 1 capsule (2 mg total) by mouth as needed for diarrhea or loose stools. 30 capsule 0  . loratadine (CLARITIN) 10 MG tablet Take 10 mg by mouth daily as needed for allergies.    . magic mouthwash w/lidocaine SOLN Take 10 mLs by mouth 3 (three) times daily as needed for mouth pain. Swish and spit 10 ML by mouth 3 times daily as needed for mouth pain. 240 mL 0  . meclizine (ANTIVERT) 25 MG tablet Take 25 mg by mouth 3 (three) times daily as needed for dizziness.    . ondansetron (ZOFRAN-ODT) 8 MG disintegrating tablet TAKE 1 TABLET BY MOUTH EVERY 8 HOURS AS NEEDED FOR NAUSE OR VOMITING 40 tablet 0  . potassium chloride (K-DUR) 10 MEQ tablet Take 1 tablet (10 mEq total) by mouth 4 (four) times daily. 360 tablet 2  . prochlorperazine (COMPAZINE) 10 MG tablet Take 1 tablet (10 mg total) by mouth every 6 (six) hours as needed for nausea or vomiting. 40 tablet 2  . traMADol (ULTRAM) 50 MG tablet Take 1 tablet (50 mg total) by mouth every 6 (six) hours as needed. 30 tablet 0   No current facility-administered medications for this visit.     PHYSICAL EXAMINATION: ECOG PERFORMANCE STATUS: 1 - Symptomatic but completely ambulatory  Vitals:   10/03/18 1200  BP: 108/72  Pulse: 80  Resp: 18  Temp: 98.4 F (36.9 C)  SpO2: 100%   Filed Weights   10/03/18 1200  Weight: 98 lb 9.6 oz (44.7 kg)    GENERAL:alert, no distress and comfortable SKIN: skin color, texture, turgor are normal, no rashes or significant lesions EYES:  normal, Conjunctiva are pink and non-injected, sclera clear OROPHARYNX:no exudate, no erythema and lips, buccal mucosa, and tongue normal  NECK: supple, thyroid normal size, non-tender, without nodularity LYMPH:  no palpable lymphadenopathy in the cervical, axillary or inguinal LUNGS: clear to auscultation and percussion with normal breathing effort HEART: regular rate & rhythm and no murmurs and no lower extremity edema ABDOMEN:abdomen soft, non-tender and normal bowel sounds Musculoskeletal:no cyanosis of digits and no clubbing  NEURO: alert & oriented x 3 with fluent speech, no focal motor/sensory deficits  LABORATORY DATA:  I have reviewed the data as listed CBC Latest Ref Rng & Units 10/03/2018 09/19/2018 09/13/2018  WBC 4.0 - 10.5 K/uL 3.2(L) 5.5 13.4(H)  Hemoglobin 12.0 - 15.0 g/dL 9.3(L) 10.1(L) 7.8(L)  Hematocrit 36.0 - 46.0 % 29.8(L) 31.3(L) 24.0(L)  Platelets 150 - 400 K/uL 191 161 210     CMP Latest Ref Rng & Units 10/03/2018 09/19/2018 09/13/2018  Glucose 70 - 99 mg/dL 106(H) 117(H) 123(H)  BUN 6 - 20 mg/dL 15 17 20  Creatinine 0.44 - 1.00 mg/dL 1.13(H) 1.08(H) 1.02(H)  Sodium 135 - 145 mmol/L 141 140 135  Potassium 3.5 - 5.1 mmol/L 3.4(L) 3.6 3.8  Chloride 98 - 111 mmol/L 105 102 99  CO2 22 - 32 mmol/L 27 28 27   Calcium 8.9 - 10.3 mg/dL 9.4 10.1 10.1  Total Protein 6.5 - 8.1 g/dL 7.6 8.1 7.7  Total Bilirubin 0.3 - 1.2 mg/dL 1.1 1.2 1.4(H)  Alkaline Phos 38 - 126 U/L 414(H) 624(H) 535(H)  AST 15 - 41 U/L 70(H) 90(H) 56(H)  ALT 0 - 44 U/L 50(H) 57(H) 41      RADIOGRAPHIC STUDIES: I have personally reviewed the radiological images as listed and agreed with the findings in the report. No results found.   ASSESSMENT & PLAN:  BLAKE GOYA is a 58 y.o. female with   1. 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 Treated with chemo.She was treated withfirst linechemoFOLFIRINOX with dose reduction. Due  to neuropathyand cytopenia, chemo changed tosecond line 5-FU pump infusion and liposomal irinotecan every 2 weekswith dose reductiondue to cytopenia. -Unfortunately she recently had disease progression with worsening liver mets.  -She started second-line Gemcitabine and Abraxane 2 weeks on/1 week off on 09/12/18. She tolerated first cycle well with no significant side effects except hair loss.  -Plan to scan after 4 cycles to monitor response, or sooner if CA19.9 increases. -Labs reviewed, WBC at 3.2, Hg at 9.3, CMP showed a stable transaminitis, and a mild increased creatinine, adequate for treatment, will proceed with Gem/Abraxane today.  -F/u in 1 week   2. Weight lossandmalnutrition -Due to chemo and underlying metastatic cancer. -Continue nutritional supplement and f/u with dietician. -Weight remains stable  -I encouraged Morgan Dawson to increase calorie and carbohydrate intake.   3. Transaminitis -Due to underlying chemo and malignancy -Stableand mild overall  4. Pancytopenia, secondary to chemo -Required chemo dose reduction. -S/p blood transfusion as needed with gh <8. Last on 08/10/18 -Labs reviewed, WBC at 3.2, Hg at 9.3, PLT normal (10/03/18)  5. Hypokalemia, CKD Stage III -Sheis currently on KCL supplements -K3.4, Cr 1.13, overall stable    6.Peripheral neuropathy, grade 2 -Currently on Neurontin 200 mg at night and B complexes. Continue. -She agreed to use ice bags with Abraxane infusion, continue   7.Goal of care discussion -She is full code now -She understands Morgan Dawson cancer is incurable at this stage, and the goal of therapy is palliative, to prolong his life, and prevent cancer related symptoms.  8. Inguinal hernia  -Pt notes this is increasing and being more protruded.  -I encouraged Morgan Dawson to not lift anything too heavy  -I discussed given Morgan Dawson cancer, surgeon will likely not proceed with hernia repair. She understands.  -She notes starting to protrude more,  no pain but uncomfortable. To prevent bowel becoming herniated, I advised Morgan Dawson to lay down and push Morgan Dawson hernia back in place with discomfort, If she cough she should hold in hernia and she should avoid any constipation.  -I recommend she use colace     Plan -Labs reviewed and adequate to proceed with C2D1 Gem/Abraxane today  -lab, flush, f/u and Gem/Abraxane in 1 week   No problem-specific Assessment & Plan notes found for this encounter.   No orders of the defined types were placed in this encounter.  All questions were answered. The patient knows to call the clinic with any problems, questions or concerns. No barriers to learning was detected. I spent  20 minutes counseling the patient face to face. The total time spent in the appointment was 25 minutes and more than 50% was on counseling and review of test results     [name], MD 10/03/2018   I, Joslyn Devon, am acting as scribe for [name], MD.   I have reviewed the above documentation for accuracy and completeness, and I agree with the above.

## 2018-10-03 ENCOUNTER — Inpatient Hospital Stay (HOSPITAL_BASED_OUTPATIENT_CLINIC_OR_DEPARTMENT_OTHER): Payer: 59 | Admitting: Hematology

## 2018-10-03 ENCOUNTER — Ambulatory Visit: Payer: 59

## 2018-10-03 ENCOUNTER — Encounter: Payer: Self-pay | Admitting: Hematology

## 2018-10-03 ENCOUNTER — Other Ambulatory Visit: Payer: 59

## 2018-10-03 ENCOUNTER — Inpatient Hospital Stay: Payer: 59

## 2018-10-03 ENCOUNTER — Inpatient Hospital Stay: Payer: 59 | Attending: Hematology

## 2018-10-03 VITALS — BP 108/72 | HR 80 | Temp 98.4°F | Resp 18 | Ht 65.0 in | Wt 98.6 lb

## 2018-10-03 DIAGNOSIS — D61810 Antineoplastic chemotherapy induced pancytopenia: Secondary | ICD-10-CM | POA: Diagnosis not present

## 2018-10-03 DIAGNOSIS — K4090 Unilateral inguinal hernia, without obstruction or gangrene, not specified as recurrent: Secondary | ICD-10-CM | POA: Diagnosis not present

## 2018-10-03 DIAGNOSIS — N183 Chronic kidney disease, stage 3 unspecified: Secondary | ICD-10-CM

## 2018-10-03 DIAGNOSIS — Z95828 Presence of other vascular implants and grafts: Secondary | ICD-10-CM

## 2018-10-03 DIAGNOSIS — Z5111 Encounter for antineoplastic chemotherapy: Secondary | ICD-10-CM | POA: Insufficient documentation

## 2018-10-03 DIAGNOSIS — C787 Secondary malignant neoplasm of liver and intrahepatic bile duct: Secondary | ICD-10-CM | POA: Diagnosis present

## 2018-10-03 DIAGNOSIS — Z79899 Other long term (current) drug therapy: Secondary | ICD-10-CM

## 2018-10-03 DIAGNOSIS — E876 Hypokalemia: Secondary | ICD-10-CM

## 2018-10-03 DIAGNOSIS — D6481 Anemia due to antineoplastic chemotherapy: Secondary | ICD-10-CM

## 2018-10-03 DIAGNOSIS — Z7189 Other specified counseling: Secondary | ICD-10-CM

## 2018-10-03 DIAGNOSIS — C259 Malignant neoplasm of pancreas, unspecified: Secondary | ICD-10-CM | POA: Insufficient documentation

## 2018-10-03 DIAGNOSIS — G629 Polyneuropathy, unspecified: Secondary | ICD-10-CM | POA: Diagnosis not present

## 2018-10-03 DIAGNOSIS — R740 Nonspecific elevation of levels of transaminase and lactic acid dehydrogenase [LDH]: Secondary | ICD-10-CM | POA: Diagnosis not present

## 2018-10-03 DIAGNOSIS — C251 Malignant neoplasm of body of pancreas: Secondary | ICD-10-CM

## 2018-10-03 DIAGNOSIS — T451X5A Adverse effect of antineoplastic and immunosuppressive drugs, initial encounter: Secondary | ICD-10-CM

## 2018-10-03 LAB — CBC WITH DIFFERENTIAL/PLATELET
ABS IMMATURE GRANULOCYTES: 0.02 10*3/uL (ref 0.00–0.07)
BASOS ABS: 0 10*3/uL (ref 0.0–0.1)
Basophils Relative: 1 %
Eosinophils Absolute: 0.1 10*3/uL (ref 0.0–0.5)
Eosinophils Relative: 2 %
HCT: 29.8 % — ABNORMAL LOW (ref 36.0–46.0)
Hemoglobin: 9.3 g/dL — ABNORMAL LOW (ref 12.0–15.0)
Immature Granulocytes: 1 %
Lymphocytes Relative: 10 %
Lymphs Abs: 0.3 10*3/uL — ABNORMAL LOW (ref 0.7–4.0)
MCH: 31.5 pg (ref 26.0–34.0)
MCHC: 31.2 g/dL (ref 30.0–36.0)
MCV: 101 fL — ABNORMAL HIGH (ref 80.0–100.0)
Monocytes Absolute: 0.6 10*3/uL (ref 0.1–1.0)
Monocytes Relative: 20 %
Neutro Abs: 2.1 10*3/uL (ref 1.7–7.7)
Neutrophils Relative %: 66 %
Platelets: 191 10*3/uL (ref 150–400)
RBC: 2.95 MIL/uL — ABNORMAL LOW (ref 3.87–5.11)
RDW: 19.1 % — ABNORMAL HIGH (ref 11.5–15.5)
WBC: 3.2 10*3/uL — ABNORMAL LOW (ref 4.0–10.5)
nRBC: 0 % (ref 0.0–0.2)

## 2018-10-03 LAB — COMPREHENSIVE METABOLIC PANEL
ALT: 50 U/L — AB (ref 0–44)
ANION GAP: 9 (ref 5–15)
AST: 70 U/L — ABNORMAL HIGH (ref 15–41)
Albumin: 3.1 g/dL — ABNORMAL LOW (ref 3.5–5.0)
Alkaline Phosphatase: 414 U/L — ABNORMAL HIGH (ref 38–126)
BUN: 15 mg/dL (ref 6–20)
CO2: 27 mmol/L (ref 22–32)
Calcium: 9.4 mg/dL (ref 8.9–10.3)
Chloride: 105 mmol/L (ref 98–111)
Creatinine, Ser: 1.13 mg/dL — ABNORMAL HIGH (ref 0.44–1.00)
GFR calc Af Amer: >60 mL/min (ref >60)
GFR, EST NON AFRICAN AMERICAN: 55 mL/min — AB (ref >60)
GLUCOSE: 106 mg/dL — AB (ref 70–99)
Potassium: 3.4 mmol/L — ABNORMAL LOW (ref 3.5–5.1)
Sodium: 141 mmol/L (ref 135–145)
Total Bilirubin: 1.1 mg/dL (ref 0.3–1.2)
Total Protein: 7.6 g/dL (ref 6.5–8.1)

## 2018-10-03 MED ORDER — HEPARIN SOD (PORK) LOCK FLUSH 100 UNIT/ML IV SOLN
500.0000 [IU] | Freq: Once | INTRAVENOUS | Status: AC | PRN
  Administered 2018-10-03: 500 [IU]
  Filled 2018-10-03: qty 5

## 2018-10-03 MED ORDER — SODIUM CHLORIDE 0.9% FLUSH
10.0000 mL | INTRAVENOUS | Status: DC | PRN
  Administered 2018-10-03: 10 mL
  Filled 2018-10-03: qty 10

## 2018-10-03 MED ORDER — SODIUM CHLORIDE 0.9% FLUSH
10.0000 mL | Freq: Once | INTRAVENOUS | Status: AC
  Administered 2018-10-03: 10 mL
  Filled 2018-10-03: qty 10

## 2018-10-03 MED ORDER — SODIUM CHLORIDE 0.9 % IV SOLN
Freq: Once | INTRAVENOUS | Status: AC
  Administered 2018-10-03: 13:00:00 via INTRAVENOUS
  Filled 2018-10-03: qty 250

## 2018-10-03 MED ORDER — PROCHLORPERAZINE MALEATE 10 MG PO TABS
10.0000 mg | ORAL_TABLET | Freq: Once | ORAL | Status: AC
  Administered 2018-10-03: 10 mg via ORAL

## 2018-10-03 MED ORDER — SODIUM CHLORIDE 0.9 % IV SOLN
1000.0000 mg/m2 | Freq: Once | INTRAVENOUS | Status: AC
  Administered 2018-10-03: 1444 mg via INTRAVENOUS
  Filled 2018-10-03: qty 37.98

## 2018-10-03 MED ORDER — PROCHLORPERAZINE MALEATE 10 MG PO TABS
ORAL_TABLET | ORAL | Status: AC
  Filled 2018-10-03: qty 1

## 2018-10-03 MED ORDER — PACLITAXEL PROTEIN-BOUND CHEMO INJECTION 100 MG
125.0000 mg/m2 | Freq: Once | INTRAVENOUS | Status: AC
  Administered 2018-10-03: 175 mg via INTRAVENOUS
  Filled 2018-10-03: qty 35

## 2018-10-03 NOTE — Patient Instructions (Signed)
[facility] Discharge Instructions for Patients Receiving Chemotherapy  Today you received the following chemotherapy agents Gemzar and Abraxane  To help prevent nausea and vomiting after your treatment, we encourage you to take your nausea medication as directed.   If you develop nausea and vomiting that is not controlled by your nausea medication, call the clinic.   BELOW ARE SYMPTOMS THAT SHOULD BE REPORTED IMMEDIATELY:  *FEVER GREATER THAN 100.5 F  *CHILLS WITH OR WITHOUT FEVER  NAUSEA AND VOMITING THAT IS NOT CONTROLLED WITH YOUR NAUSEA MEDICATION  *UNUSUAL SHORTNESS OF BREATH  *UNUSUAL BRUISING OR BLEEDING  TENDERNESS IN MOUTH AND THROAT WITH OR WITHOUT PRESENCE OF ULCERS  *URINARY PROBLEMS  *BOWEL PROBLEMS  UNUSUAL RASH Items with * indicate a potential emergency and should be followed up as soon as possible.  Feel free to call the clinic should you have any questions or concerns. The clinic phone number is [phone].  Please show the CHEMO ALERT CARD at check-in to the Emergency Department and triage nurse.   

## 2018-10-04 ENCOUNTER — Telehealth: Payer: Self-pay | Admitting: Hematology

## 2018-10-04 NOTE — Telephone Encounter (Signed)
No los per 2/10.

## 2018-10-07 NOTE — Progress Notes (Signed)
[place]   [phone] [phone]   Clinic Follow up Note   Patient Care Team: [name], MD as PCP - General (Family Medicine) [name], MD as Consulting Physician (Otolaryngology)  Date of Service:  10/10/2018  CHIEF COMPLAINT: F/u of pancreatic cancer  SUMMARY OF ONCOLOGIC HISTORY: Oncology History   Cancer Staging Pancreatic cancer [facility]) Staging form: Exocrine Pancreas, AJCC 8th Edition - Clinical stage from 08/06/2017: Stage IV (cTX, cN0, pM1) - Signed by [name], MD on 08/12/2017       Pancreatic cancer ([place])   07/23/2017 Imaging    US abdomen limited RUQ 07/23/17 IMPRESSION: 1. Cholelithiasis.  No secondary signs of acute cholecystitis. 2. Multiple solid liver masses measuring up to 6.5 cm in the left lobe of the liver, evaluation for metastatic disease is recommended. These results will be called to the ordering clinician or representative by the Radiologist Assistant, and communication documented in the PACS or zVision Dashboard.    07/23/2017 Imaging    CT Abdomen W Contrast 07/23/17 IMPRESSION: 1. Widespread metastatic disease throughout the liver. No clear primary malignancy identified in the abdomen. The pelvis was not imaged. Tissue sampling recommended. 2. Probable adenopathy superior to the pancreatic tail. No evidence of pancreatic mass. 3. Suspected incidental hemangioma inferiorly in the right hepatic lobe. 4. Nonspecific nodularity in the breasts. The patient has undergone recent (03/25/2017 and 04/01/2017) mammography and ultrasound.    07/29/2017 Initial Diagnosis    Metastasis to liver of unknown origin ([place])    07/29/2017 PET scan    PET 07/29/17  IMPRESSION: 1. Numerous bulky liver masses are hypermetabolic compatible with malignancy. 2. Accentuated activity within or along the pancreatic tail, likely represent a primary pancreatic tumor. Consider pancreatic protocol MRI to further work  this up. 3. The peripancreatic lymph node shown above the pancreatic tail is mildly hypermetabolic favoring malignancy. 4.  Prominent stool throughout the colon favors constipation. 5. 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    08/05/2017 Pathology Results    Liver Biopsy  Diagnosis 08/05/17 Liver, needle/core biopsy - CARCINOMA. - SEE COMMENT. Microscopic Comment The malignant cells are positive for cytokeratin 7. They are negative for arginase, CDX2, cytokeratin 20, estrogen receptor, GATA-3, GCDFP, Glypican 3, Hep Par 1, Napsin A, and TTF-1. This immunohistochemical is nonspecific. Possibly primary sources include pancreatobiliary and upper gastrointestinal. Radiologic correlation is necessary. Of note, organ specific markers (GATA-3, GCDFP-breast, TTF-1, Napsin A-lung, and CDX2-colon) are negative. (JBK:ecj 08/09/2017)    08/21/2017 - 02/10/2018 Chemotherapy    FOLFIRINOX every 2 weeks starting 08/21/17. Dose reduced due to neuropahty and cytopenia     10/14/2017 Imaging    CT CAP WO Contrast 10/14/17 IMPRESSION: Evidence of known numerous liver metastases without significant interval change. No other evidence of metastatic disease within the chest, abdomen or pelvis. Cholelithiasis. Tiny pericardial effusion.    12/02/2017 Genetic Testing    Negative for pathogenic mutation.  The genes analyzed were the 83 genes on [facility]'s Multi-Cancer panel (ALK, APC, ATM, AXIN2, BAP1, BARD1, BLM, BMPR1A, BRCA1, BRCA2, BRIP1, CASR, CDC73, CDH1, CDK4, CDKN1B, CDKN1C, CDKN2A, CEBPA, CHEK2, CTNNA1, DICER1, DIS3L2, EGFR, EPCAM, FH, FLCN, GATA2, GPC3, GREM1, HOXB13, HRAS, KIT, MAX, MEN1, MET, MITF, MLH1, MSH2, MSH3, MSH6, MUTYH, NBN, NF1, NF2, NTHL1, PALB2, PDGFRA, PHOX2B, PMS2, POLD1, POLE, POT1, PRKAR1A, PTCH1, PTEN, RAD50, RAD51C, RAD51D, RB1, RECQL4, RET, RUNX1, SDHA, SDHAF2, SDHB, SDHC, SDHD, SMAD4, SMARCA4, SMARCB1, SMARCE1, STK11, SUFU, TERC, TERT, TMEM127, TP53, TSC1, TSC2, VHL, WRN, WT1).  12/17/2017 PET scan    IMPRESSION: 1. Although the liver metastases are still visible on the CT images, they are significantly smaller and retain no abnormal metabolic activity, consistent with response to therapy. 2. No abnormal activity within the pancreas. 3. No disease progression identified. 4. Cholelithiasis.    02/21/2018 PET scan    02/21/2018 PET Scan  IMPRESSION: 1. There are 2 new small nodules identified within the right lung which measure up to 5 mm. These are too small to reliably characterize by PET-CT, but warrant close interval follow-up. 2. Again noted are multifocal liver metastasis. The target lesion within the left lobe is slightly decreased in size when compared with the previous exam. Similar to previous exam there is no abnormal hypermetabolism above background liver activity identified within the liver lesions. 3. Gallstones.    02/22/2018 - 08/25/2018 Chemotherapy    5-FU pump infusion and liposomal irinotecan, every 2 weeks.  First cycle dose reduced due to cytopenia in the travel. Stopped on 08/25/2018 due to disease progression.     05/17/2018 Imaging    05/17/2018 PET Scan IMPRESSION: 1. Mixed response. The 2 small right lung nodules have decreased in size in the interval. Single focus of increased uptake within the liver is new from 02/21/2018 and concerning for recurrent metabolically active liver metastasis. 2. Splenomegaly.  New from previous exam. 3. Diffusely increased bone marrow activity, likely reflecting treatment related changes.    09/01/2018 PET scan    PET 09/01/18 IMPRESSION: 1. Unfortunately there recurrence of hepatic metastasis with multiple new hypermetabolic lesions in LEFT and RIGHT hepatic lobe. 2. New extra hepatic site of malignancy which appears associated the mid pancreas. Difficult to define lesion on noncontrast exam. 3. Diffuse marrow activity is favored benign. 4. No evidence of pulmonary metastasis.    09/12/2018 -  Chemotherapy     second-line Gemcitabine and Abraxane for 2 weeks on, 1 week off starting 09/12/2018. Due to neuropathy, I will start her with dose reduced Abraxane.      Pancreatic cancer metastasized to liver ([place])   08/11/2017 Initial Diagnosis    Pancreatic cancer metastasized to liver ([place])    09/12/2018 -  Chemotherapy    second-line Gemcitabine and Abraxane for 2 weeks on, 1 week off starting 09/12/2018. Due to neuropathy, I will start her with dose reduced Abraxane.       CURRENT THERAPY:  Second-line Gemcitabine and Abraxane for 2 weeks on, 1 week off starting 09/12/2018  INTERVAL HISTORY:  Morgan Dawson is here for a follow up and treatment. She presents to the clinic today with her husband. She notes she has a stable and adequate week last week. She is happy she was able to gain the 1-2 pounds to get her 100lbs. She feels that she is eating well. She denies any pain. She is not able to do much exercise. She notes her neuropathy remains stable.  She notes she plans to fly to [place] on 2/27-3/1.     REVIEW OF SYSTEMS:   Constitutional: Denies fevers, chills (+) weight gain  Eyes: Denies blurriness of vision Ears, nose, mouth, throat, and face: Denies mucositis or sore throat Respiratory: Denies cough, dyspnea or wheezes Cardiovascular: Denies palpitation, chest discomfort or lower extremity swelling Gastrointestinal:  Denies nausea, heartburn or change in bowel habits Skin: Denies abnormal skin rashes Lymphatics: Denies new lymphadenopathy or easy bruising Neurological:Denies new weaknesses (+) stable neuropathy  Behavioral/Psych: Mood is stable, no new changes  All other systems were reviewed with the  patient and are negative.  MEDICAL HISTORY:  Past Medical History:  Diagnosis Date  . Genetic testing 12/02/2017   Multi-Cancer panel (83 genes) @ [facility] - No pathogenic mutations detected  . Meniere disease 2016  . Pancreatitis     SURGICAL HISTORY: Past Surgical History:    Procedure Laterality Date  . CERVICAL ABLATION    . ENDOMETRIAL ABLATION  2011  . IR FLUORO GUIDE PORT INSERTION RIGHT  08/12/2017  . IR US GUIDE VASC ACCESS RIGHT  08/12/2017  . [place]    I have reviewed the social history and family history with the patient and they are unchanged from previous note.  ALLERGIES:  has No Known Allergies.  MEDICATIONS:  Current Outpatient Medications  Medication Sig Dispense Refill  . gabapentin (NEURONTIN) 100 MG capsule Take 1 capsule (100 mg total) by mouth 3 (three) times daily. 90 capsule 2  . loperamide (IMODIUM) 2 MG capsule Take 1 capsule (2 mg total) by mouth as needed for diarrhea or loose stools. 30 capsule 0  . loratadine (CLARITIN) 10 MG tablet Take 10 mg by mouth daily as needed for allergies.    . magic mouthwash w/lidocaine SOLN Take 10 mLs by mouth 3 (three) times daily as needed for mouth pain. Swish and spit 10 ML by mouth 3 times daily as needed for mouth pain. 240 mL 0  . meclizine (ANTIVERT) 25 MG tablet Take 25 mg by mouth 3 (three) times daily as needed for dizziness.    . ondansetron (ZOFRAN-ODT) 8 MG disintegrating tablet TAKE 1 TABLET BY MOUTH EVERY 8 HOURS AS NEEDED FOR NAUSE OR VOMITING 40 tablet 0  . potassium chloride (K-DUR) 10 MEQ tablet Take 1 tablet (10 mEq total) by mouth 4 (four) times daily. 360 tablet 2  . prochlorperazine (COMPAZINE) 10 MG tablet Take 1 tablet (10 mg total) by mouth every 6 (six) hours as needed for nausea or vomiting. 40 tablet 2  . traMADol (ULTRAM) 50 MG tablet Take 1 tablet (50 mg total) by mouth every 6 (six) hours as needed. 30 tablet 0   No current facility-administered medications for this visit.    Facility-Administered Medications Ordered in Other Visits  Medication Dose Route Frequency Provider Last Rate Last Dose  . gemcitabine (GEMZAR) 1,444 mg in sodium chloride 0.9 % 250 mL chemo infusion  1,000 mg/m2 (Treatment Plan Recorded) Intravenous Once [name], MD      .  heparin lock flush 100 unit/mL  500 Units Intracatheter Once PRN [name], MD      . PACLitaxel-protein bound (ABRAXANE) chemo infusion 175 mg  125 mg/m2 (Treatment Plan Recorded) Intravenous Once [name], MD 999 mL/hr at 10/10/18 1120 175 mg at 10/10/18 1120  . sodium chloride flush (NS) 0.9 % injection 10 mL  10 mL Intracatheter PRN [name], MD        PHYSICAL EXAMINATION: ECOG PERFORMANCE STATUS: 1 - Symptomatic but completely ambulatory  Vitals:   10/10/18 0843  BP: 122/80  Pulse: 83  Resp: 18  Temp: 98.1 F (36.7 C)  SpO2: 100%   Filed Weights   10/10/18 0843  Weight: 100 lb 3.2 oz (45.5 kg)    GENERAL:alert, no distress and comfortable SKIN: skin color, texture, turgor are normal, no rashes or significant lesions EYES: normal, Conjunctiva are pink and non-injected, sclera clear OROPHARYNX:no exudate, no erythema and lips, buccal mucosa, and tongue normal  NECK: supple, thyroid normal size, non-tender, without nodularity LYMPH:  no palpable lymphadenopathy in  the cervical, axillary or inguinal LUNGS: clear to auscultation and percussion with normal breathing effort HEART: regular rate & rhythm and no murmurs and no lower extremity edema ABDOMEN:abdomen soft, non-tender and normal bowel sounds Musculoskeletal:no cyanosis of digits and no clubbing  NEURO: alert & oriented x 3 with fluent speech, no focal motor/sensory deficits  LABORATORY DATA:  I have reviewed the data as listed CBC Latest Ref Rng & Units 10/10/2018 10/03/2018 09/19/2018  WBC 4.0 - 10.5 K/uL 2.0(L) 3.2(L) 5.5  Hemoglobin 12.0 - 15.0 g/dL 8.8(L) 9.3(L) 10.1(L)  Hematocrit 36.0 - 46.0 % 28.0(L) 29.8(L) 31.3(L)  Platelets 150 - 400 K/uL 134(L) 191 161     CMP Latest Ref Rng & Units 10/10/2018 10/03/2018 09/19/2018  Glucose 70 - 99 mg/dL 130(H) 106(H) 117(H)  BUN 6 - 20 mg/dL 15 15 17   Creatinine 0.44 - 1.00 mg/dL 1.06(H) 1.13(H) 1.08(H)  Sodium 135 - 145 mmol/L 141 141 140  Potassium 3.5 - 5.1 mmol/L  3.4(L) 3.4(L) 3.6  Chloride 98 - 111 mmol/L 106 105 102  CO2 22 - 32 mmol/L 24 27 28   Calcium 8.9 - 10.3 mg/dL 9.6 9.4 10.1  Total Protein 6.5 - 8.1 g/dL 7.6 7.6 8.1  Total Bilirubin 0.3 - 1.2 mg/dL 0.8 1.1 1.2  Alkaline Phos 38 - 126 U/L 422(H) 414(H) 624(H)  AST 15 - 41 U/L 92(H) 70(H) 90(H)  ALT 0 - 44 U/L 76(H) 50(H) 57(H)      RADIOGRAPHIC STUDIES: I have personally reviewed the radiological images as listed and agreed with the findings in the report. No results found.   ASSESSMENT & PLAN:  Morgan Dawson is a 58 y.o. female with   1. 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 Treated with chemo.She was treated withfirst linechemoFOLFIRINOX with dose reduction. Due to neuropathyand cytopenia, chemo changed tosecond line 5-FU pump infusion and liposomal irinotecan every 2 weekswith dose reductiondue to cytopenia. -Unfortunately she recently had disease progression with worsening liver mets.  -She started second-line Gemcitabine and Abraxane 2 weeks on/1 week off on 09/12/18. She tolerates well with no significant side effects except hair loss.  -I encouraged her to increase her activity level to help her energy and neuropathy.  -Plan to scan after 4 cycles to monitor response, or sooner if CA19.9 increases. -Labs reviewed and overall adeqaute to proceed with Gem/Abraxane today  -F/u in 2 weeks   2. Weight lossandmalnutrition -Due to chemo and underlying metastatic cancer. -Continue nutritional supplement and f/u with dietician. -Appetite and eating much improved. She has been able to gain weight to 100 lbs   3. Transaminitis -Due to underlying chemo and malignancy -Stableand mild overall  4. Pancytopenia, secondary to chemo -Required chemo dose reduction. -S/p blood transfusion as needed with gh <8. Last on 08/10/18 -Labs reviewed, WBC at 2, Hg at 8.8, PLT at 134K today (10/10/18).  -We will  repeat her CBC next week to see if she needs blood transfusion before her trip  5. Hypokalemia, CKD Stage III -Sheis currently on KCL supplements -K3.4, Cr 1.13, overall stable    6.Peripheral neuropathy, grade 2 -Currently on Neurontin 200 mg at night and B complexes. Continue. -She agreed to use ice bags with Abraxane infusion, continue  -stable   7.Goal of care discussion -She is full code now -She understands her cancer is incurable at this stage, and the goal of therapy is palliative, to prolong his life, and prevent cancer related symptoms.  8. Inguinal hernia  -I discussed given her cancer, surgeon will likely not proceed with hernia repair. She understands.  -She notes starting to protrude more, no pain but uncomfortable. To prevent bowel becoming herniated, she has been advised her to lay down and push her hernia back in place when in discomfort, If she coughs she should hold in hernia and she should avoid any constipation.      Plan -Labs reviewed, mild neutropenia and stable transaminitis, and Overall adeqaute to proceed with Gem/Abraxane today  -lab, flush, f/u and Gem/Abraxane in 2 weeks    No problem-specific Assessment & Plan notes found for this encounter.   No orders of the defined types were placed in this encounter.  All questions were answered. The patient knows to call the clinic with any problems, questions or concerns. No barriers to learning was detected. I spent 10 minutes counseling the patient face to face. The total time spent in the appointment was 15 minutes and more than 50% was on counseling and review of test results     [name], MD 10/10/2018   I, Joslyn Devon, am acting as scribe for [name], MD.   I have reviewed the above documentation for accuracy and completeness, and I agree with the above.

## 2018-10-10 ENCOUNTER — Ambulatory Visit: Payer: 59

## 2018-10-10 ENCOUNTER — Inpatient Hospital Stay: Payer: 59

## 2018-10-10 ENCOUNTER — Encounter: Payer: Self-pay | Admitting: Hematology

## 2018-10-10 ENCOUNTER — Ambulatory Visit: Payer: 59 | Admitting: Hematology

## 2018-10-10 ENCOUNTER — Other Ambulatory Visit: Payer: 59

## 2018-10-10 ENCOUNTER — Inpatient Hospital Stay (HOSPITAL_BASED_OUTPATIENT_CLINIC_OR_DEPARTMENT_OTHER): Payer: 59 | Admitting: Hematology

## 2018-10-10 VITALS — BP 122/80 | HR 83 | Temp 98.1°F | Resp 18 | Ht 65.0 in | Wt 100.2 lb

## 2018-10-10 DIAGNOSIS — C251 Malignant neoplasm of body of pancreas: Secondary | ICD-10-CM

## 2018-10-10 DIAGNOSIS — R740 Nonspecific elevation of levels of transaminase and lactic acid dehydrogenase [LDH]: Secondary | ICD-10-CM | POA: Diagnosis not present

## 2018-10-10 DIAGNOSIS — C787 Secondary malignant neoplasm of liver and intrahepatic bile duct: Secondary | ICD-10-CM | POA: Diagnosis not present

## 2018-10-10 DIAGNOSIS — Z95828 Presence of other vascular implants and grafts: Secondary | ICD-10-CM

## 2018-10-10 DIAGNOSIS — C259 Malignant neoplasm of pancreas, unspecified: Secondary | ICD-10-CM

## 2018-10-10 DIAGNOSIS — Z7189 Other specified counseling: Secondary | ICD-10-CM

## 2018-10-10 DIAGNOSIS — E876 Hypokalemia: Secondary | ICD-10-CM

## 2018-10-10 DIAGNOSIS — T451X5A Adverse effect of antineoplastic and immunosuppressive drugs, initial encounter: Secondary | ICD-10-CM

## 2018-10-10 DIAGNOSIS — G629 Polyneuropathy, unspecified: Secondary | ICD-10-CM

## 2018-10-10 DIAGNOSIS — Z5111 Encounter for antineoplastic chemotherapy: Secondary | ICD-10-CM | POA: Diagnosis not present

## 2018-10-10 DIAGNOSIS — K4090 Unilateral inguinal hernia, without obstruction or gangrene, not specified as recurrent: Secondary | ICD-10-CM

## 2018-10-10 DIAGNOSIS — D61810 Antineoplastic chemotherapy induced pancytopenia: Secondary | ICD-10-CM

## 2018-10-10 DIAGNOSIS — N183 Chronic kidney disease, stage 3 unspecified: Secondary | ICD-10-CM

## 2018-10-10 DIAGNOSIS — D6481 Anemia due to antineoplastic chemotherapy: Secondary | ICD-10-CM

## 2018-10-10 DIAGNOSIS — Z79899 Other long term (current) drug therapy: Secondary | ICD-10-CM

## 2018-10-10 LAB — CBC WITH DIFFERENTIAL/PLATELET
Abs Immature Granulocytes: 0.02 10*3/uL (ref 0.00–0.07)
BASOS PCT: 1 %
Basophils Absolute: 0 10*3/uL (ref 0.0–0.1)
Eosinophils Absolute: 0 10*3/uL (ref 0.0–0.5)
Eosinophils Relative: 1 %
HCT: 28 % — ABNORMAL LOW (ref 36.0–46.0)
Hemoglobin: 8.8 g/dL — ABNORMAL LOW (ref 12.0–15.0)
Immature Granulocytes: 1 %
Lymphocytes Relative: 25 %
Lymphs Abs: 0.5 10*3/uL — ABNORMAL LOW (ref 0.7–4.0)
MCH: 31.7 pg (ref 26.0–34.0)
MCHC: 31.4 g/dL (ref 30.0–36.0)
MCV: 100.7 fL — ABNORMAL HIGH (ref 80.0–100.0)
Monocytes Absolute: 0.2 10*3/uL (ref 0.1–1.0)
Monocytes Relative: 10 %
Neutro Abs: 1.2 10*3/uL — ABNORMAL LOW (ref 1.7–7.7)
Neutrophils Relative %: 62 %
Platelets: 134 10*3/uL — ABNORMAL LOW (ref 150–400)
RBC: 2.78 MIL/uL — ABNORMAL LOW (ref 3.87–5.11)
RDW: 18.1 % — AB (ref 11.5–15.5)
WBC: 2 10*3/uL — ABNORMAL LOW (ref 4.0–10.5)
nRBC: 0 % (ref 0.0–0.2)

## 2018-10-10 LAB — COMPREHENSIVE METABOLIC PANEL
ALT: 76 U/L — ABNORMAL HIGH (ref 0–44)
ANION GAP: 11 (ref 5–15)
AST: 92 U/L — ABNORMAL HIGH (ref 15–41)
Albumin: 3.2 g/dL — ABNORMAL LOW (ref 3.5–5.0)
Alkaline Phosphatase: 422 U/L — ABNORMAL HIGH (ref 38–126)
BUN: 15 mg/dL (ref 6–20)
CO2: 24 mmol/L (ref 22–32)
Calcium: 9.6 mg/dL (ref 8.9–10.3)
Chloride: 106 mmol/L (ref 98–111)
Creatinine, Ser: 1.06 mg/dL — ABNORMAL HIGH (ref 0.44–1.00)
GFR calc Af Amer: >60 mL/min (ref >60)
GFR calc non Af Amer: 59 mL/min — ABNORMAL LOW (ref >60)
Glucose, Bld: 130 mg/dL — ABNORMAL HIGH (ref 70–99)
Potassium: 3.4 mmol/L — ABNORMAL LOW (ref 3.5–5.1)
Sodium: 141 mmol/L (ref 135–145)
Total Bilirubin: 0.8 mg/dL (ref 0.3–1.2)
Total Protein: 7.6 g/dL (ref 6.5–8.1)

## 2018-10-10 MED ORDER — PACLITAXEL PROTEIN-BOUND CHEMO INJECTION 100 MG
125.0000 mg/m2 | Freq: Once | INTRAVENOUS | Status: AC
  Administered 2018-10-10: 175 mg via INTRAVENOUS
  Filled 2018-10-10: qty 35

## 2018-10-10 MED ORDER — SODIUM CHLORIDE 0.9% FLUSH
10.0000 mL | Freq: Once | INTRAVENOUS | Status: AC
  Administered 2018-10-10: 10 mL
  Filled 2018-10-10: qty 10

## 2018-10-10 MED ORDER — SODIUM CHLORIDE 0.9% FLUSH
10.0000 mL | INTRAVENOUS | Status: DC | PRN
  Administered 2018-10-10: 10 mL
  Filled 2018-10-10: qty 10

## 2018-10-10 MED ORDER — PACLITAXEL PROTEIN-BOUND CHEMO INJECTION 100 MG
125.0000 mg/m2 | Freq: Once | Status: DC
  Filled 2018-10-10: qty 35

## 2018-10-10 MED ORDER — SODIUM CHLORIDE 0.9 % IV SOLN
1000.0000 mg/m2 | Freq: Once | INTRAVENOUS | Status: AC
  Administered 2018-10-10: 1444 mg via INTRAVENOUS
  Filled 2018-10-10: qty 37.98

## 2018-10-10 MED ORDER — PROCHLORPERAZINE MALEATE 10 MG PO TABS
10.0000 mg | ORAL_TABLET | Freq: Once | ORAL | Status: AC
  Administered 2018-10-10: 10 mg via ORAL

## 2018-10-10 MED ORDER — PROCHLORPERAZINE MALEATE 10 MG PO TABS
ORAL_TABLET | ORAL | Status: AC
  Filled 2018-10-10: qty 1

## 2018-10-10 MED ORDER — SODIUM CHLORIDE 0.9 % IV SOLN
Freq: Once | INTRAVENOUS | Status: AC
  Administered 2018-10-10: 10:00:00 via INTRAVENOUS
  Filled 2018-10-10: qty 250

## 2018-10-10 MED ORDER — HEPARIN SOD (PORK) LOCK FLUSH 100 UNIT/ML IV SOLN
500.0000 [IU] | Freq: Once | INTRAVENOUS | Status: AC | PRN
  Administered 2018-10-10: 500 [IU]
  Filled 2018-10-10: qty 5

## 2018-10-10 NOTE — Patient Instructions (Signed)
[facility] Discharge Instructions for Patients Receiving Chemotherapy  Today you received the following chemotherapy agents Abraxane, Gemzar  To help prevent nausea and vomiting after your treatment, we encourage you to take your nausea medication as prescribed.   If you develop nausea and vomiting that is not controlled by your nausea medication, call the clinic.   BELOW ARE SYMPTOMS THAT SHOULD BE REPORTED IMMEDIATELY:  *FEVER GREATER THAN 100.5 F  *CHILLS WITH OR WITHOUT FEVER  NAUSEA AND VOMITING THAT IS NOT CONTROLLED WITH YOUR NAUSEA MEDICATION  *UNUSUAL SHORTNESS OF BREATH  *UNUSUAL BRUISING OR BLEEDING  TENDERNESS IN MOUTH AND THROAT WITH OR WITHOUT PRESENCE OF ULCERS  *URINARY PROBLEMS  *BOWEL PROBLEMS  UNUSUAL RASH Items with * indicate a potential emergency and should be followed up as soon as possible.  Feel free to call the clinic should you have any questions or concerns. The clinic phone number is [phone].  Please show the CHEMO ALERT CARD at check-in to the Emergency Department and triage nurse.   

## 2018-10-10 NOTE — Addendum Note (Signed)
Addended by: [name] on: 10/10/2018 11:43 AM   Modules accepted: Orders

## 2018-10-10 NOTE — Progress Notes (Signed)
Per Dr. Burr Medico okay to treat based on labs AST, ALT and Total Bili and ANC 1.2 as resulted today 10/10/2018, using same dose, notified Nichole in Infusion.

## 2018-10-11 ENCOUNTER — Telehealth: Payer: Self-pay | Admitting: Hematology

## 2018-10-11 LAB — CANCER ANTIGEN 19-9: CA 19-9: 41 U/mL — ABNORMAL HIGH (ref 0–35)

## 2018-10-11 NOTE — Telephone Encounter (Signed)
Appts were already scheduled. Just needed to be checked out.

## 2018-10-17 ENCOUNTER — Telehealth: Payer: Self-pay

## 2018-10-17 ENCOUNTER — Inpatient Hospital Stay: Payer: 59

## 2018-10-17 DIAGNOSIS — T451X5A Adverse effect of antineoplastic and immunosuppressive drugs, initial encounter: Principal | ICD-10-CM

## 2018-10-17 DIAGNOSIS — C787 Secondary malignant neoplasm of liver and intrahepatic bile duct: Secondary | ICD-10-CM

## 2018-10-17 DIAGNOSIS — D6481 Anemia due to antineoplastic chemotherapy: Secondary | ICD-10-CM

## 2018-10-17 DIAGNOSIS — C259 Malignant neoplasm of pancreas, unspecified: Secondary | ICD-10-CM

## 2018-10-17 DIAGNOSIS — Z95828 Presence of other vascular implants and grafts: Secondary | ICD-10-CM

## 2018-10-17 DIAGNOSIS — C251 Malignant neoplasm of body of pancreas: Secondary | ICD-10-CM

## 2018-10-17 DIAGNOSIS — Z5111 Encounter for antineoplastic chemotherapy: Secondary | ICD-10-CM | POA: Diagnosis not present

## 2018-10-17 LAB — SAMPLE TO BLOOD BANK

## 2018-10-17 LAB — COMPREHENSIVE METABOLIC PANEL
ALT: 87 U/L — ABNORMAL HIGH (ref 0–44)
AST: 86 U/L — ABNORMAL HIGH (ref 15–41)
Albumin: 3.4 g/dL — ABNORMAL LOW (ref 3.5–5.0)
Alkaline Phosphatase: 406 U/L — ABNORMAL HIGH (ref 38–126)
Anion gap: 10 (ref 5–15)
BUN: 17 mg/dL (ref 6–20)
CO2: 25 mmol/L (ref 22–32)
Calcium: 10 mg/dL (ref 8.9–10.3)
Chloride: 105 mmol/L (ref 98–111)
Creatinine, Ser: 1.22 mg/dL — ABNORMAL HIGH (ref 0.44–1.00)
GFR calc non Af Amer: 50 mL/min — ABNORMAL LOW (ref >60)
GFR, EST AFRICAN AMERICAN: 58 mL/min — AB (ref >60)
Glucose, Bld: 107 mg/dL — ABNORMAL HIGH (ref 70–99)
Potassium: 3.7 mmol/L (ref 3.5–5.1)
Sodium: 140 mmol/L (ref 135–145)
Total Bilirubin: 0.8 mg/dL (ref 0.3–1.2)
Total Protein: 7.9 g/dL (ref 6.5–8.1)

## 2018-10-17 LAB — CBC WITH DIFFERENTIAL/PLATELET
Abs Immature Granulocytes: 0.09 10*3/uL — ABNORMAL HIGH (ref 0.00–0.07)
Basophils Absolute: 0 10*3/uL (ref 0.0–0.1)
Basophils Relative: 1 %
EOS ABS: 0 10*3/uL (ref 0.0–0.5)
Eosinophils Relative: 1 %
HCT: 27.1 % — ABNORMAL LOW (ref 36.0–46.0)
Hemoglobin: 8.5 g/dL — ABNORMAL LOW (ref 12.0–15.0)
Immature Granulocytes: 3 %
Lymphocytes Relative: 15 %
Lymphs Abs: 0.5 10*3/uL — ABNORMAL LOW (ref 0.7–4.0)
MCH: 32.2 pg (ref 26.0–34.0)
MCHC: 31.4 g/dL (ref 30.0–36.0)
MCV: 102.7 fL — ABNORMAL HIGH (ref 80.0–100.0)
Monocytes Absolute: 0.3 10*3/uL (ref 0.1–1.0)
Monocytes Relative: 9 %
Neutro Abs: 2.6 10*3/uL (ref 1.7–7.7)
Neutrophils Relative %: 71 %
Platelets: 57 10*3/uL — ABNORMAL LOW (ref 150–400)
RBC: 2.64 MIL/uL — ABNORMAL LOW (ref 3.87–5.11)
RDW: 19.3 % — ABNORMAL HIGH (ref 11.5–15.5)
WBC: 3.5 10*3/uL — ABNORMAL LOW (ref 4.0–10.5)
nRBC: 0 % (ref 0.0–0.2)

## 2018-10-17 MED ORDER — HEPARIN SOD (PORK) LOCK FLUSH 100 UNIT/ML IV SOLN
500.0000 [IU] | Freq: Once | INTRAVENOUS | Status: DC
  Filled 2018-10-17: qty 5

## 2018-10-17 MED ORDER — SODIUM CHLORIDE 0.9% FLUSH
10.0000 mL | Freq: Once | INTRAVENOUS | Status: DC
  Filled 2018-10-17: qty 10

## 2018-10-17 NOTE — Progress Notes (Signed)
Pt. Requested blood to be drawn peripheral.

## 2018-10-17 NOTE — Telephone Encounter (Signed)
Spoke with patient regarding lab results from today, Hemoglobin 8.5, patient asymptomatic, per Dr. Burr Medico no need for blood transfusion. Patient verbalized an understanding.

## 2018-10-18 ENCOUNTER — Telehealth: Payer: Self-pay

## 2018-10-18 NOTE — Telephone Encounter (Signed)
Spoke with patient regarding lab results, platelets are low, creatinine is slightly elevated.  Per Dr. Burr Medico instructed her to watch for bleeding, avoid NSAIDS, drink plently of fluids.   Patient verbalized an understanding.

## 2018-10-18 NOTE — Telephone Encounter (Signed)
-----   Message from [name], MD sent at 10/17/2018  8:49 PM EST ----- Please let her know that her plt is low today, secondary to chemo. Her Cr is also slightly elevated, make sure she drinks fluids adequately. No NSAIDs, be careful about bleeding. Thanks   [name]  10/17/2018

## 2018-10-21 NOTE — Progress Notes (Signed)
[name]   [phone] [phone]   Clinic Follow up Note   Patient Care Team: [name], MD as PCP - General (Family Medicine) [name], MD as Consulting Physician (Otolaryngology)  Date of Service:  10/24/2018  CHIEF COMPLAINT:  F/u of pancreatic cancer  SUMMARY OF ONCOLOGIC HISTORY: Oncology History   Cancer Staging Pancreatic cancer [facility]) Staging form: Exocrine Pancreas, AJCC 8th Edition - Clinical stage from 08/06/2017: Stage IV (cTX, cN0, pM1) - Signed by [name], MD on 08/12/2017       Pancreatic cancer ([facility])   07/23/2017 Imaging    US abdomen limited RUQ 07/23/17 IMPRESSION: 1. Cholelithiasis.  No secondary signs of acute cholecystitis. 2. Multiple solid liver masses measuring up to 6.5 cm in the left lobe of the liver, evaluation for metastatic disease is recommended. These results will be called to the ordering clinician or representative by the Radiologist Assistant, and communication documented in the PACS or zVision Dashboard.    07/23/2017 Imaging    CT Abdomen W Contrast 07/23/17 IMPRESSION: 1. Widespread metastatic disease throughout the liver. No clear primary malignancy identified in the abdomen. The pelvis was not imaged. Tissue sampling recommended. 2. Probable adenopathy superior to the pancreatic tail. No evidence of pancreatic mass. 3. Suspected incidental hemangioma inferiorly in the right hepatic lobe. 4. Nonspecific nodularity in the breasts. The patient has undergone recent (03/25/2017 and 04/01/2017) mammography and ultrasound.    07/29/2017 Initial Diagnosis    Metastasis to liver of unknown origin ([place])    07/29/2017 PET scan    PET 07/29/17  IMPRESSION: 1. Numerous bulky liver masses are hypermetabolic compatible with malignancy. 2. Accentuated activity within or along the pancreatic tail, likely represent a primary pancreatic tumor. Consider pancreatic protocol MRI to further work  this up. 3. The peripancreatic lymph node shown above the pancreatic tail is mildly hypermetabolic favoring malignancy. 4.  Prominent stool throughout the colon favors constipation. 5. 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    08/05/2017 Pathology Results    Liver Biopsy  Diagnosis 08/05/17 Liver, needle/core biopsy - CARCINOMA. - SEE COMMENT. Microscopic Comment The malignant cells are positive for cytokeratin 7. They are negative for arginase, CDX2, cytokeratin 20, estrogen receptor, GATA-3, GCDFP, Glypican 3, Hep Par 1, Napsin A, and TTF-1. This immunohistochemical is nonspecific. Possibly primary sources include pancreatobiliary and upper gastrointestinal. Radiologic correlation is necessary. Of note, organ specific markers (GATA-3, GCDFP-breast, TTF-1, Napsin A-lung, and CDX2-colon) are negative. (JBK:ecj 08/09/2017)    08/21/2017 - 02/10/2018 Chemotherapy    FOLFIRINOX every 2 weeks starting 08/21/17. Dose reduced due to neuropahty and cytopenia     10/14/2017 Imaging    CT CAP WO Contrast 10/14/17 IMPRESSION: Evidence of known numerous liver metastases without significant interval change. No other evidence of metastatic disease within the chest, abdomen or pelvis. Cholelithiasis. Tiny pericardial effusion.    12/02/2017 Genetic Testing    Negative for pathogenic mutation.  The genes analyzed were the 83 genes on [facility]'s Multi-Cancer panel (ALK, APC, ATM, AXIN2, BAP1, BARD1, BLM, BMPR1A, BRCA1, BRCA2, BRIP1, CASR, CDC73, CDH1, CDK4, CDKN1B, CDKN1C, CDKN2A, CEBPA, CHEK2, CTNNA1, DICER1, DIS3L2, EGFR, EPCAM, FH, FLCN, GATA2, GPC3, GREM1, HOXB13, HRAS, KIT, MAX, MEN1, MET, MITF, MLH1, MSH2, MSH3, MSH6, MUTYH, NBN, NF1, NF2, NTHL1, PALB2, PDGFRA, PHOX2B, PMS2, POLD1, POLE, POT1, PRKAR1A, PTCH1, PTEN, RAD50, RAD51C, RAD51D, RB1, RECQL4, RET, RUNX1, SDHA, SDHAF2, SDHB, SDHC, SDHD, SMAD4, SMARCA4, SMARCB1, SMARCE1, STK11, SUFU, TERC, TERT, TMEM127, TP53, TSC1, TSC2, VHL, WRN, WT1).  12/17/2017 PET scan    IMPRESSION: 1. Although the liver metastases are still visible on the CT images, they are significantly smaller and retain no abnormal metabolic activity, consistent with response to therapy. 2. No abnormal activity within the pancreas. 3. No disease progression identified. 4. Cholelithiasis.    02/21/2018 PET scan    02/21/2018 PET Scan  IMPRESSION: 1. There are 2 new small nodules identified within the right lung which measure up to 5 mm. These are too small to reliably characterize by PET-CT, but warrant close interval follow-up. 2. Again noted are multifocal liver metastasis. The target lesion within the left lobe is slightly decreased in size when compared with the previous exam. Similar to previous exam there is no abnormal hypermetabolism above background liver activity identified within the liver lesions. 3. Gallstones.    02/22/2018 - 08/25/2018 Chemotherapy    5-FU pump infusion and liposomal irinotecan, every 2 weeks.  First cycle dose reduced due to cytopenia in the travel. Stopped on 08/25/2018 due to disease progression.     05/17/2018 Imaging    05/17/2018 PET Scan IMPRESSION: 1. Mixed response. The 2 small right lung nodules have decreased in size in the interval. Single focus of increased uptake within the liver is new from 02/21/2018 and concerning for recurrent metabolically active liver metastasis. 2. Splenomegaly.  New from previous exam. 3. Diffusely increased bone marrow activity, likely reflecting treatment related changes.    09/01/2018 PET scan    PET 09/01/18 IMPRESSION: 1. Unfortunately there recurrence of hepatic metastasis with multiple new hypermetabolic lesions in LEFT and RIGHT hepatic lobe. 2. New extra hepatic site of malignancy which appears associated the mid pancreas. Difficult to define lesion on noncontrast exam. 3. Diffuse marrow activity is favored benign. 4. No evidence of pulmonary metastasis.    09/12/2018 -  Chemotherapy     second-line Gemcitabine and Abraxane for 2 weeks on, 1 week off starting 09/12/2018. Due to neuropathy, I will start her with dose reduced Abraxane.      Pancreatic cancer metastasized to liver ([name])   08/11/2017 Initial Diagnosis    Pancreatic cancer metastasized to liver ([place])    09/12/2018 -  Chemotherapy    second-line Gemcitabine and Abraxane for 2 weeks on, 1 week off starting 09/12/2018. Due to neuropathy, I will start her with dose reduced Abraxane.       CURRENT THERAPY:  Second-line Gemcitabine and Abraxane for 2 weeks on, 1 week off starting 09/12/2018  INTERVAL HISTORY:  BREANA LITTS is here for a follow up and treatment. She presents to the clinic today by herself. She notes she has been eating well with improved appetite and has gained 1 pound. She notes her recent trip to [place] went well. She does not plan to travel anytime soon.     REVIEW OF SYSTEMS:  Constitutional: Denies fevers, chills or abnormal weight loss Eyes: Denies blurriness of vision Ears, nose, mouth, throat, and face: Denies mucositis or sore throat Respiratory: Denies cough, dyspnea or wheezes Cardiovascular: Denies palpitation, chest discomfort or lower extremity swelling Gastrointestinal:  Denies nausea, heartburn or change in bowel habits Skin: Denies abnormal skin rashes Lymphatics: Denies new lymphadenopathy or easy bruising Neurological:Denies numbness, tingling or new weaknesses Behavioral/Psych: Mood is stable, no new changes  All other systems were reviewed with the patient and are negative.  MEDICAL HISTORY:  Past Medical History:  Diagnosis Date  . Genetic testing 12/02/2017   Multi-Cancer panel (83 genes) @ [facility] - No pathogenic mutations detected  .  Meniere disease 2016  . Pancreatitis     SURGICAL HISTORY: Past Surgical History:  Procedure Laterality Date  . CERVICAL ABLATION    . ENDOMETRIAL ABLATION  2011  . IR FLUORO GUIDE PORT INSERTION RIGHT  08/12/2017  . IR  US GUIDE VASC ACCESS RIGHT  08/12/2017  . [place]    I have reviewed the social history and family history with the patient and they are unchanged from previous note.  ALLERGIES:  has No Known Allergies.  MEDICATIONS:  Current Outpatient Medications  Medication Sig Dispense Refill  . gabapentin (NEURONTIN) 100 MG capsule Take 1 capsule (100 mg total) by mouth 3 (three) times daily. 90 capsule 2  . loperamide (IMODIUM) 2 MG capsule Take 1 capsule (2 mg total) by mouth as needed for diarrhea or loose stools. 30 capsule 0  . loratadine (CLARITIN) 10 MG tablet Take 10 mg by mouth daily as needed for allergies.    . magic mouthwash w/lidocaine SOLN Take 10 mLs by mouth 3 (three) times daily as needed for mouth pain. Swish and spit 10 ML by mouth 3 times daily as needed for mouth pain. 240 mL 0  . meclizine (ANTIVERT) 25 MG tablet Take 25 mg by mouth 3 (three) times daily as needed for dizziness.    . ondansetron (ZOFRAN-ODT) 8 MG disintegrating tablet TAKE 1 TABLET BY MOUTH EVERY 8 HOURS AS NEEDED FOR NAUSE OR VOMITING 40 tablet 0  . potassium chloride (K-DUR) 10 MEQ tablet Take 1 tablet (10 mEq total) by mouth 4 (four) times daily. 360 tablet 2  . prochlorperazine (COMPAZINE) 10 MG tablet Take 1 tablet (10 mg total) by mouth every 6 (six) hours as needed for nausea or vomiting. 40 tablet 2  . traMADol (ULTRAM) 50 MG tablet Take 1 tablet (50 mg total) by mouth every 6 (six) hours as needed. 30 tablet 0   No current facility-administered medications for this visit.    Facility-Administered Medications Ordered in Other Visits  Medication Dose Route Frequency Provider Last Rate Last Dose  . 0.9 %  sodium chloride infusion   Intravenous Once [name], MD      . gemcitabine (GEMZAR) 1,444 mg in sodium chloride 0.9 % 100 mL chemo infusion  1,000 mg/m2 (Treatment Plan Recorded) Intravenous Once [name], MD      . heparin lock flush 100 unit/mL  500 Units Intracatheter Once PRN [name], MD      . PACLitaxel-protein bound (ABRAXANE) chemo infusion 175 mg  125 mg/m2 (Treatment Plan Recorded) Intravenous Once [name], MD      . prochlorperazine (COMPAZINE) tablet 10 mg  10 mg Oral Once [name], MD      . sodium chloride flush (NS) 0.9 % injection 10 mL  10 mL Intracatheter PRN [name], MD        PHYSICAL EXAMINATION: ECOG PERFORMANCE STATUS: 1 - Symptomatic but completely ambulatory  Vitals with BMI 10/24/2018  Height _0   Weight 101 lbs  BMI 16.10  Systolic 960  Diastolic 80  Pulse 82  Respirations 16    GENERAL:alert, no distress and comfortable SKIN: skin color, texture, turgor are normal, no rashes or significant lesions EYES: normal, Conjunctiva are pink and non-injected, sclera clear OROPHARYNX:no exudate, no erythema and lips, buccal mucosa, and tongue normal  NECK: supple, thyroid normal size, non-tender, without nodularity LYMPH:  no palpable lymphadenopathy in the cervical, axillary or inguinal LUNGS: clear to auscultation and percussion with normal breathing effort  HEART: regular rate & rhythm and no murmurs and no lower extremity edema ABDOMEN:abdomen soft, non-tender and normal bowel sounds Musculoskeletal:no cyanosis of digits and no clubbing  NEURO: alert & oriented x 3 with fluent speech, no focal motor/sensory deficits  LABORATORY DATA:  I have reviewed the data as listed CBC Latest Ref Rng & Units 10/24/2018 10/17/2018 10/10/2018  WBC 4.0 - 10.5 K/uL 3.5(L) 3.5(L) 2.0(L)  Hemoglobin 12.0 - 15.0 g/dL 8.9(L) 8.5(L) 8.8(L)  Hematocrit 36.0 - 46.0 % 27.9(L) 27.1(L) 28.0(L)  Platelets 150 - 400 K/uL 177 57(L) 134(L)     CMP Latest Ref Rng & Units 10/24/2018 10/17/2018 10/10/2018  Glucose 70 - 99 mg/dL 107(H) 107(H) 130(H)  BUN 6 - 20 mg/dL _0 Creatinine 0.44 - 1.00 mg/dL 1.09(H) 1.22(H) 1.06(H)  Sodium 135 - 145 mmol/L 142 140 141  Potassium 3.5 - 5.1 mmol/L 3.6 3.7 3.4(L)  Chloride 98 - 111 mmol/L 105 105 106  CO2 22 - 32 mmol/L  _1 Calcium 8.9 - 10.3 mg/dL 9.4 10.0 9.6  Total Protein 6.5 - 8.1 g/dL 7.5 7.9 7.6  Total Bilirubin 0.3 - 1.2 mg/dL 0.8 0.8 0.8  Alkaline Phos 38 - 126 U/L 313(H) 406(H) 422(H)  AST 15 - 41 U/L 58(H) 86(H) 92(H)  ALT 0 - 44 U/L 45(H) 87(H) 76(H)      RADIOGRAPHIC STUDIES: I have personally reviewed the radiological images as listed and agreed with the findings in the report. No results found.   ASSESSMENT & PLAN:  Morgan Dawson is a 58 y.o. female with   1. 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 Treated with chemo.She was treated withfirst linechemoFOLFIRINOX with dose reduction. Due to neuropathyand cytopenia, chemo changed tosecond line 5-FU pump infusion and liposomal irinotecan every 2 weekswith dose reductiondue to cytopenia. -Unfortunately she recently had disease progression withworseningliver mets.  -She started second-line Gemcitabine and Abraxane2 weeks on/1 week offon 09/12/18. She tolerates well with no significant side effects except hair loss.  -Labs reviewed and adequate to proceed with Cycle 3 today. Last Ca 19-9 has plateaued at 41. Overall adequate to proceed with Gem/Abraxane  -Plan to scan after 4 cycles to monitor response -Labs reviewed and overall adequate to proceed with Gem/Abraxane today  -F/u in 4 weeks   2. Weight lossandmalnutrition -Due to chemo and underlying metastatic cancer. -Continue nutritional supplement and f/u with dietician. -Appetite and eating much improved. She has been able to gain weight to 100 lbs. Appetite and weight continues to increase.   3. Transaminitis -Due to underlying chemo and malignancy -Improved and mild   4. Pancytopenia, secondary to chemo -Required chemo dose reduction. -S/p blood transfusion as needed with gh <8. Last on 08/10/18 -Labs reviewed,WBC 3.5, Hg at 8.9, PLT normal. (10/24/18) -We will repeat her CBC next week to  see if she needs blood transfusion before her trip  5. Hypokalemia, CKD Stage III -Sheis currently on KCL supplements -K normal, Cr improved to 1.09 (10/24/18)  6.Peripheral neuropathy, grade 2 -Currently on Neurontin 200 mg at night and B complexes. Continue. -She agreed to use ice bags with Abraxane infusion, continue -stable   7.Goal of care discussion -She is full code now -She understands her cancer is incurable at this stage, and the goal of therapy is palliative, to prolong his life, and prevent cancer related symptoms.  8. Inguinal hernia  -I discussed given her cancer, surgeon will likely not proceed  with hernia repair. She understands. -She notes starting to protrude more, no pain but uncomfortable. To prevent bowel becoming herniated, she has been advised her to lay down and push her hernia back in place when in discomfort, If she coughs she should hold in hernia and she should avoid any constipation.  -Stable      Plan -Labs reviewed and Overall adequate to proceed with C3D1 Gem/Abraxane today.  -She will return next week for day 8 chemo -lab, flush, f/u and Gem/Abraxane in 3 weeks     No problem-specific Assessment & Plan notes found for this encounter.   No orders of the defined types were placed in this encounter.  All questions were answered. The patient knows to call the clinic with any problems, questions or concerns. No barriers to learning was detected. I spent 10 minutes counseling the patient face to face. The total time spent in the appointment was 15 minutes and more than 50% was on counseling and review of test results     [name], MD 10/24/2018   I, Joslyn Devon, am acting as scribe for [name], MD.   I have reviewed the above documentation for accuracy and completeness, and I agree with the above.

## 2018-10-23 ENCOUNTER — Other Ambulatory Visit: Payer: Self-pay | Admitting: Hematology

## 2018-10-23 DIAGNOSIS — C251 Malignant neoplasm of body of pancreas: Secondary | ICD-10-CM

## 2018-10-24 ENCOUNTER — Inpatient Hospital Stay (HOSPITAL_BASED_OUTPATIENT_CLINIC_OR_DEPARTMENT_OTHER): Payer: 59 | Admitting: Hematology

## 2018-10-24 ENCOUNTER — Inpatient Hospital Stay: Payer: 59

## 2018-10-24 ENCOUNTER — Telehealth: Payer: Self-pay | Admitting: Hematology

## 2018-10-24 ENCOUNTER — Other Ambulatory Visit: Payer: 59

## 2018-10-24 ENCOUNTER — Ambulatory Visit: Payer: 59 | Admitting: Hematology

## 2018-10-24 ENCOUNTER — Inpatient Hospital Stay: Payer: 59 | Attending: Hematology

## 2018-10-24 ENCOUNTER — Ambulatory Visit: Payer: 59

## 2018-10-24 VITALS — BP 121/80 | HR 82 | Temp 98.3°F | Resp 16 | Ht 65.0 in | Wt 101.0 lb

## 2018-10-24 DIAGNOSIS — E46 Unspecified protein-calorie malnutrition: Secondary | ICD-10-CM | POA: Insufficient documentation

## 2018-10-24 DIAGNOSIS — Z5111 Encounter for antineoplastic chemotherapy: Secondary | ICD-10-CM | POA: Diagnosis present

## 2018-10-24 DIAGNOSIS — K4090 Unilateral inguinal hernia, without obstruction or gangrene, not specified as recurrent: Secondary | ICD-10-CM

## 2018-10-24 DIAGNOSIS — E876 Hypokalemia: Secondary | ICD-10-CM | POA: Diagnosis not present

## 2018-10-24 DIAGNOSIS — D61810 Antineoplastic chemotherapy induced pancytopenia: Secondary | ICD-10-CM | POA: Insufficient documentation

## 2018-10-24 DIAGNOSIS — R740 Nonspecific elevation of levels of transaminase and lactic acid dehydrogenase [LDH]: Secondary | ICD-10-CM

## 2018-10-24 DIAGNOSIS — G629 Polyneuropathy, unspecified: Secondary | ICD-10-CM | POA: Insufficient documentation

## 2018-10-24 DIAGNOSIS — N183 Chronic kidney disease, stage 3 unspecified: Secondary | ICD-10-CM

## 2018-10-24 DIAGNOSIS — C251 Malignant neoplasm of body of pancreas: Secondary | ICD-10-CM

## 2018-10-24 DIAGNOSIS — L98491 Non-pressure chronic ulcer of skin of other sites limited to breakdown of skin: Secondary | ICD-10-CM | POA: Diagnosis not present

## 2018-10-24 DIAGNOSIS — C787 Secondary malignant neoplasm of liver and intrahepatic bile duct: Secondary | ICD-10-CM | POA: Insufficient documentation

## 2018-10-24 DIAGNOSIS — C259 Malignant neoplasm of pancreas, unspecified: Secondary | ICD-10-CM

## 2018-10-24 DIAGNOSIS — T451X5A Adverse effect of antineoplastic and immunosuppressive drugs, initial encounter: Secondary | ICD-10-CM

## 2018-10-24 DIAGNOSIS — D61818 Other pancytopenia: Secondary | ICD-10-CM

## 2018-10-24 DIAGNOSIS — Z95828 Presence of other vascular implants and grafts: Secondary | ICD-10-CM

## 2018-10-24 DIAGNOSIS — D6481 Anemia due to antineoplastic chemotherapy: Secondary | ICD-10-CM

## 2018-10-24 DIAGNOSIS — Z7189 Other specified counseling: Secondary | ICD-10-CM

## 2018-10-24 LAB — CBC WITH DIFFERENTIAL/PLATELET
Abs Immature Granulocytes: 0.02 10*3/uL (ref 0.00–0.07)
Basophils Absolute: 0 10*3/uL (ref 0.0–0.1)
Basophils Relative: 1 %
Eosinophils Absolute: 0.1 10*3/uL (ref 0.0–0.5)
Eosinophils Relative: 3 %
HEMATOCRIT: 27.9 % — AB (ref 36.0–46.0)
HEMOGLOBIN: 8.9 g/dL — AB (ref 12.0–15.0)
Immature Granulocytes: 1 %
Lymphocytes Relative: 12 %
Lymphs Abs: 0.4 10*3/uL — ABNORMAL LOW (ref 0.7–4.0)
MCH: 33.1 pg (ref 26.0–34.0)
MCHC: 31.9 g/dL (ref 30.0–36.0)
MCV: 103.7 fL — ABNORMAL HIGH (ref 80.0–100.0)
MONOS PCT: 18 %
Monocytes Absolute: 0.6 10*3/uL (ref 0.1–1.0)
Neutro Abs: 2.3 10*3/uL (ref 1.7–7.7)
Neutrophils Relative %: 65 %
Platelets: 177 10*3/uL (ref 150–400)
RBC: 2.69 MIL/uL — ABNORMAL LOW (ref 3.87–5.11)
RDW: 20.2 % — ABNORMAL HIGH (ref 11.5–15.5)
WBC: 3.5 10*3/uL — ABNORMAL LOW (ref 4.0–10.5)
nRBC: 0 % (ref 0.0–0.2)

## 2018-10-24 LAB — COMPREHENSIVE METABOLIC PANEL
ALBUMIN: 3.4 g/dL — AB (ref 3.5–5.0)
ALT: 45 U/L — ABNORMAL HIGH (ref 0–44)
AST: 58 U/L — ABNORMAL HIGH (ref 15–41)
Alkaline Phosphatase: 313 U/L — ABNORMAL HIGH (ref 38–126)
Anion gap: 11 (ref 5–15)
BUN: 17 mg/dL (ref 6–20)
CO2: 26 mmol/L (ref 22–32)
CREATININE: 1.09 mg/dL — AB (ref 0.44–1.00)
Calcium: 9.4 mg/dL (ref 8.9–10.3)
Chloride: 105 mmol/L (ref 98–111)
GFR calc Af Amer: >60 mL/min (ref >60)
GFR calc non Af Amer: 57 mL/min — ABNORMAL LOW (ref >60)
Glucose, Bld: 107 mg/dL — ABNORMAL HIGH (ref 70–99)
Potassium: 3.6 mmol/L (ref 3.5–5.1)
Sodium: 142 mmol/L (ref 135–145)
Total Bilirubin: 0.8 mg/dL (ref 0.3–1.2)
Total Protein: 7.5 g/dL (ref 6.5–8.1)

## 2018-10-24 LAB — SAMPLE TO BLOOD BANK

## 2018-10-24 MED ORDER — HEPARIN SOD (PORK) LOCK FLUSH 100 UNIT/ML IV SOLN
500.0000 [IU] | Freq: Once | INTRAVENOUS | Status: AC | PRN
  Administered 2018-10-24: 500 [IU]
  Filled 2018-10-24: qty 5

## 2018-10-24 MED ORDER — PROCHLORPERAZINE MALEATE 10 MG PO TABS
10.0000 mg | ORAL_TABLET | Freq: Once | ORAL | Status: AC
  Administered 2018-10-24: 10 mg via ORAL

## 2018-10-24 MED ORDER — SODIUM CHLORIDE 0.9% FLUSH
10.0000 mL | INTRAVENOUS | Status: DC | PRN
  Administered 2018-10-24: 10 mL
  Filled 2018-10-24: qty 10

## 2018-10-24 MED ORDER — PACLITAXEL PROTEIN-BOUND CHEMO INJECTION 100 MG
125.0000 mg/m2 | Freq: Once | INTRAVENOUS | Status: AC
  Administered 2018-10-24: 175 mg via INTRAVENOUS
  Filled 2018-10-24: qty 35

## 2018-10-24 MED ORDER — SODIUM CHLORIDE 0.9% FLUSH
10.0000 mL | Freq: Once | INTRAVENOUS | Status: AC
  Administered 2018-10-24: 10 mL
  Filled 2018-10-24: qty 10

## 2018-10-24 MED ORDER — SODIUM CHLORIDE 0.9 % IV SOLN
Freq: Once | INTRAVENOUS | Status: AC
  Administered 2018-10-24: 14:00:00 via INTRAVENOUS
  Filled 2018-10-24: qty 250

## 2018-10-24 MED ORDER — SODIUM CHLORIDE 0.9 % IV SOLN
1000.0000 mg/m2 | Freq: Once | INTRAVENOUS | Status: AC
  Administered 2018-10-24: 1444 mg via INTRAVENOUS
  Filled 2018-10-24: qty 37.98

## 2018-10-24 MED ORDER — PROCHLORPERAZINE MALEATE 10 MG PO TABS
ORAL_TABLET | ORAL | Status: AC
  Filled 2018-10-24: qty 1

## 2018-10-24 NOTE — Telephone Encounter (Signed)
Scheduled appt per 3/2 los. 

## 2018-10-24 NOTE — Patient Instructions (Signed)
[facility] Discharge Instructions for Patients Receiving Chemotherapy  Today you received the following chemotherapy agents Abraxane, Gemzar  To help prevent nausea and vomiting after your treatment, we encourage you to take your nausea medication as prescribed.   If you develop nausea and vomiting that is not controlled by your nausea medication, call the clinic.   BELOW ARE SYMPTOMS THAT SHOULD BE REPORTED IMMEDIATELY:  *FEVER GREATER THAN 100.5 F  *CHILLS WITH OR WITHOUT FEVER  NAUSEA AND VOMITING THAT IS NOT CONTROLLED WITH YOUR NAUSEA MEDICATION  *UNUSUAL SHORTNESS OF BREATH  *UNUSUAL BRUISING OR BLEEDING  TENDERNESS IN MOUTH AND THROAT WITH OR WITHOUT PRESENCE OF ULCERS  *URINARY PROBLEMS  *BOWEL PROBLEMS  UNUSUAL RASH Items with * indicate a potential emergency and should be followed up as soon as possible.  Feel free to call the clinic should you have any questions or concerns. The clinic phone number is [phone].  Please show the CHEMO ALERT CARD at check-in to the Emergency Department and triage nurse.   

## 2018-10-25 ENCOUNTER — Encounter: Payer: Self-pay | Admitting: Hematology

## 2018-10-31 ENCOUNTER — Ambulatory Visit: Payer: 59 | Admitting: Hematology

## 2018-10-31 ENCOUNTER — Inpatient Hospital Stay: Payer: 59

## 2018-10-31 VITALS — BP 114/76 | HR 84 | Temp 98.2°F | Resp 16 | Wt 100.5 lb

## 2018-10-31 DIAGNOSIS — C251 Malignant neoplasm of body of pancreas: Secondary | ICD-10-CM

## 2018-10-31 DIAGNOSIS — Z7189 Other specified counseling: Secondary | ICD-10-CM

## 2018-10-31 DIAGNOSIS — Z5111 Encounter for antineoplastic chemotherapy: Secondary | ICD-10-CM | POA: Diagnosis not present

## 2018-10-31 DIAGNOSIS — C259 Malignant neoplasm of pancreas, unspecified: Secondary | ICD-10-CM

## 2018-10-31 DIAGNOSIS — C787 Secondary malignant neoplasm of liver and intrahepatic bile duct: Principal | ICD-10-CM

## 2018-10-31 LAB — COMPREHENSIVE METABOLIC PANEL
ALT: 64 U/L — ABNORMAL HIGH (ref 0–44)
AST: 77 U/L — ABNORMAL HIGH (ref 15–41)
Albumin: 3.8 g/dL (ref 3.5–5.0)
Alkaline Phosphatase: 305 U/L — ABNORMAL HIGH (ref 38–126)
Anion gap: 10 (ref 5–15)
BUN: 20 mg/dL (ref 6–20)
CO2: 26 mmol/L (ref 22–32)
Calcium: 9.7 mg/dL (ref 8.9–10.3)
Chloride: 104 mmol/L (ref 98–111)
Creatinine, Ser: 1.12 mg/dL — ABNORMAL HIGH (ref 0.44–1.00)
GFR calc Af Amer: >60 mL/min (ref >60)
GFR calc non Af Amer: 55 mL/min — ABNORMAL LOW (ref >60)
Glucose, Bld: 121 mg/dL — ABNORMAL HIGH (ref 70–99)
Potassium: 3.8 mmol/L (ref 3.5–5.1)
Sodium: 140 mmol/L (ref 135–145)
Total Bilirubin: 0.5 mg/dL (ref 0.3–1.2)
Total Protein: 7.7 g/dL (ref 6.5–8.1)

## 2018-10-31 LAB — CBC WITH DIFFERENTIAL/PLATELET
Abs Immature Granulocytes: 0.06 10*3/uL (ref 0.00–0.07)
Basophils Absolute: 0 10*3/uL (ref 0.0–0.1)
Basophils Relative: 1 %
Eosinophils Absolute: 0 10*3/uL (ref 0.0–0.5)
Eosinophils Relative: 2 %
HCT: 28.7 % — ABNORMAL LOW (ref 36.0–46.0)
Hemoglobin: 9.2 g/dL — ABNORMAL LOW (ref 12.0–15.0)
Immature Granulocytes: 2 %
Lymphocytes Relative: 18 %
Lymphs Abs: 0.5 10*3/uL — ABNORMAL LOW (ref 0.7–4.0)
MCH: 33.5 pg (ref 26.0–34.0)
MCHC: 32.1 g/dL (ref 30.0–36.0)
MCV: 104.4 fL — ABNORMAL HIGH (ref 80.0–100.0)
Monocytes Absolute: 0.4 10*3/uL (ref 0.1–1.0)
Monocytes Relative: 14 %
NEUTROS PCT: 63 %
Neutro Abs: 1.6 10*3/uL — ABNORMAL LOW (ref 1.7–7.7)
PLATELETS: 135 10*3/uL — AB (ref 150–400)
RBC: 2.75 MIL/uL — ABNORMAL LOW (ref 3.87–5.11)
RDW: 18.7 % — AB (ref 11.5–15.5)
WBC: 2.6 10*3/uL — ABNORMAL LOW (ref 4.0–10.5)
nRBC: 0 % (ref 0.0–0.2)

## 2018-10-31 MED ORDER — HEPARIN SOD (PORK) LOCK FLUSH 100 UNIT/ML IV SOLN
500.0000 [IU] | Freq: Once | INTRAVENOUS | Status: AC | PRN
  Administered 2018-10-31: 500 [IU]
  Filled 2018-10-31: qty 5

## 2018-10-31 MED ORDER — SODIUM CHLORIDE 0.9 % IV SOLN
Freq: Once | INTRAVENOUS | Status: AC
  Administered 2018-10-31: 11:00:00 via INTRAVENOUS
  Filled 2018-10-31: qty 250

## 2018-10-31 MED ORDER — PACLITAXEL PROTEIN-BOUND CHEMO INJECTION 100 MG
125.0000 mg/m2 | Freq: Once | INTRAVENOUS | Status: AC
  Administered 2018-10-31: 175 mg via INTRAVENOUS
  Filled 2018-10-31: qty 35

## 2018-10-31 MED ORDER — SODIUM CHLORIDE 0.9 % IV SOLN
1000.0000 mg/m2 | Freq: Once | INTRAVENOUS | Status: AC
  Administered 2018-10-31: 1444 mg via INTRAVENOUS
  Filled 2018-10-31: qty 37.98

## 2018-10-31 MED ORDER — SODIUM CHLORIDE 0.9% FLUSH
10.0000 mL | INTRAVENOUS | Status: DC | PRN
  Administered 2018-10-31: 10 mL
  Filled 2018-10-31: qty 10

## 2018-10-31 MED ORDER — PROCHLORPERAZINE MALEATE 10 MG PO TABS
ORAL_TABLET | ORAL | Status: AC
  Filled 2018-10-31: qty 1

## 2018-10-31 MED ORDER — PROCHLORPERAZINE MALEATE 10 MG PO TABS
10.0000 mg | ORAL_TABLET | Freq: Once | ORAL | Status: AC
  Administered 2018-10-31: 10 mg via ORAL

## 2018-10-31 NOTE — Patient Instructions (Signed)
[facility] Discharge Instructions for Patients Receiving Chemotherapy  Today you received the following chemotherapy agents Gemzar and Abraxane  To help prevent nausea and vomiting after your treatment, we encourage you to take your nausea medication as directed.   If you develop nausea and vomiting that is not controlled by your nausea medication, call the clinic.   BELOW ARE SYMPTOMS THAT SHOULD BE REPORTED IMMEDIATELY:  *FEVER GREATER THAN 100.5 F  *CHILLS WITH OR WITHOUT FEVER  NAUSEA AND VOMITING THAT IS NOT CONTROLLED WITH YOUR NAUSEA MEDICATION  *UNUSUAL SHORTNESS OF BREATH  *UNUSUAL BRUISING OR BLEEDING  TENDERNESS IN MOUTH AND THROAT WITH OR WITHOUT PRESENCE OF ULCERS  *URINARY PROBLEMS  *BOWEL PROBLEMS  UNUSUAL RASH Items with * indicate a potential emergency and should be followed up as soon as possible.  Feel free to call the clinic should you have any questions or concerns. The clinic phone number is [phone].  Please show the CHEMO ALERT CARD at check-in to the Emergency Department and triage nurse.   

## 2018-11-14 ENCOUNTER — Other Ambulatory Visit: Payer: Self-pay | Admitting: Hematology

## 2018-11-14 ENCOUNTER — Telehealth: Payer: Self-pay

## 2018-11-14 NOTE — Telephone Encounter (Signed)
Patient calls coming in for treatment tomorrow 3/24, started Gemzar end of January and has had which she thinks are short term side effects related to this that she wanted Dr. Burr Medico to know about.   1)  After first treatment had about 3 hours of bone chilling cold.    2) February 17th treatment had acupuncture about 2 days later, experienced left ankle swelling and shooting pain, it resolved the next day.  3)  March 9th had a fever of 102.4 that evening, she took Tylenol and it was gone the next morning.  [phone]  Message given to Dr. Burr Medico

## 2018-11-15 ENCOUNTER — Inpatient Hospital Stay: Payer: 59

## 2018-11-15 ENCOUNTER — Other Ambulatory Visit: Payer: Self-pay

## 2018-11-15 VITALS — BP 108/76 | HR 84 | Temp 99.1°F | Resp 17 | Wt 101.5 lb

## 2018-11-15 DIAGNOSIS — C259 Malignant neoplasm of pancreas, unspecified: Secondary | ICD-10-CM

## 2018-11-15 DIAGNOSIS — Z5111 Encounter for antineoplastic chemotherapy: Secondary | ICD-10-CM | POA: Diagnosis not present

## 2018-11-15 DIAGNOSIS — C251 Malignant neoplasm of body of pancreas: Secondary | ICD-10-CM

## 2018-11-15 DIAGNOSIS — Z7189 Other specified counseling: Secondary | ICD-10-CM

## 2018-11-15 DIAGNOSIS — Z95828 Presence of other vascular implants and grafts: Secondary | ICD-10-CM

## 2018-11-15 DIAGNOSIS — C787 Secondary malignant neoplasm of liver and intrahepatic bile duct: Principal | ICD-10-CM

## 2018-11-15 LAB — CBC WITH DIFFERENTIAL/PLATELET
Abs Immature Granulocytes: 0.04 10*3/uL (ref 0.00–0.07)
Basophils Absolute: 0.1 10*3/uL (ref 0.0–0.1)
Basophils Relative: 1 %
Eosinophils Absolute: 0.2 10*3/uL (ref 0.0–0.5)
Eosinophils Relative: 4 %
HCT: 29.8 % — ABNORMAL LOW (ref 36.0–46.0)
HEMOGLOBIN: 9.7 g/dL — AB (ref 12.0–15.0)
Immature Granulocytes: 1 %
Lymphocytes Relative: 10 %
Lymphs Abs: 0.5 10*3/uL — ABNORMAL LOW (ref 0.7–4.0)
MCH: 34.2 pg — ABNORMAL HIGH (ref 26.0–34.0)
MCHC: 32.6 g/dL (ref 30.0–36.0)
MCV: 104.9 fL — ABNORMAL HIGH (ref 80.0–100.0)
MONO ABS: 0.7 10*3/uL (ref 0.1–1.0)
Monocytes Relative: 15 %
Neutro Abs: 3.3 10*3/uL (ref 1.7–7.7)
Neutrophils Relative %: 69 %
Platelets: 184 10*3/uL (ref 150–400)
RBC: 2.84 MIL/uL — AB (ref 3.87–5.11)
RDW: 17.6 % — ABNORMAL HIGH (ref 11.5–15.5)
WBC: 4.7 10*3/uL (ref 4.0–10.5)
nRBC: 0 % (ref 0.0–0.2)

## 2018-11-15 LAB — COMPREHENSIVE METABOLIC PANEL
ALT: 43 U/L (ref 0–44)
AST: 47 U/L — AB (ref 15–41)
Albumin: 3.4 g/dL — ABNORMAL LOW (ref 3.5–5.0)
Alkaline Phosphatase: 245 U/L — ABNORMAL HIGH (ref 38–126)
Anion gap: 12 (ref 5–15)
BUN: 18 mg/dL (ref 6–20)
CO2: 25 mmol/L (ref 22–32)
Calcium: 9.4 mg/dL (ref 8.9–10.3)
Chloride: 103 mmol/L (ref 98–111)
Creatinine, Ser: 1.06 mg/dL — ABNORMAL HIGH (ref 0.44–1.00)
GFR calc Af Amer: >60 mL/min (ref >60)
GFR, EST NON AFRICAN AMERICAN: 59 mL/min — AB (ref >60)
Glucose, Bld: 114 mg/dL — ABNORMAL HIGH (ref 70–99)
Potassium: 3.9 mmol/L (ref 3.5–5.1)
Sodium: 140 mmol/L (ref 135–145)
Total Bilirubin: 0.7 mg/dL (ref 0.3–1.2)
Total Protein: 7.7 g/dL (ref 6.5–8.1)

## 2018-11-15 MED ORDER — HEPARIN SOD (PORK) LOCK FLUSH 100 UNIT/ML IV SOLN
500.0000 [IU] | Freq: Once | INTRAVENOUS | Status: AC | PRN
  Administered 2018-11-15: 500 [IU]
  Filled 2018-11-15: qty 5

## 2018-11-15 MED ORDER — PROCHLORPERAZINE MALEATE 10 MG PO TABS
10.0000 mg | ORAL_TABLET | Freq: Once | ORAL | Status: AC
  Administered 2018-11-15: 10 mg via ORAL

## 2018-11-15 MED ORDER — PACLITAXEL PROTEIN-BOUND CHEMO INJECTION 100 MG
125.0000 mg/m2 | Freq: Once | INTRAVENOUS | Status: AC
  Administered 2018-11-15: 175 mg via INTRAVENOUS
  Filled 2018-11-15: qty 35

## 2018-11-15 MED ORDER — SODIUM CHLORIDE 0.9% FLUSH
10.0000 mL | INTRAVENOUS | Status: DC | PRN
  Administered 2018-11-15: 10 mL
  Filled 2018-11-15: qty 10

## 2018-11-15 MED ORDER — SODIUM CHLORIDE 0.9 % IV SOLN
Freq: Once | INTRAVENOUS | Status: AC
  Administered 2018-11-15: 12:00:00 via INTRAVENOUS
  Filled 2018-11-15: qty 250

## 2018-11-15 MED ORDER — SODIUM CHLORIDE 0.9 % IV SOLN
1000.0000 mg/m2 | Freq: Once | INTRAVENOUS | Status: AC
  Administered 2018-11-15: 1444 mg via INTRAVENOUS
  Filled 2018-11-15: qty 37.98

## 2018-11-15 MED ORDER — SODIUM CHLORIDE 0.9% FLUSH
10.0000 mL | Freq: Once | INTRAVENOUS | Status: AC
  Administered 2018-11-15: 10 mL
  Filled 2018-11-15: qty 10

## 2018-11-15 MED ORDER — PROCHLORPERAZINE MALEATE 10 MG PO TABS
ORAL_TABLET | ORAL | Status: AC
  Filled 2018-11-15: qty 1

## 2018-11-15 NOTE — Patient Instructions (Signed)
[facility] Discharge Instructions for Patients Receiving Chemotherapy  Today you received the following chemotherapy agents Abraxane, Gemzar  To help prevent nausea and vomiting after your treatment, we encourage you to take your nausea medication as prescribed.   If you develop nausea and vomiting that is not controlled by your nausea medication, call the clinic.   BELOW ARE SYMPTOMS THAT SHOULD BE REPORTED IMMEDIATELY:  *FEVER GREATER THAN 100.5 F  *CHILLS WITH OR WITHOUT FEVER  NAUSEA AND VOMITING THAT IS NOT CONTROLLED WITH YOUR NAUSEA MEDICATION  *UNUSUAL SHORTNESS OF BREATH  *UNUSUAL BRUISING OR BLEEDING  TENDERNESS IN MOUTH AND THROAT WITH OR WITHOUT PRESENCE OF ULCERS  *URINARY PROBLEMS  *BOWEL PROBLEMS  UNUSUAL RASH Items with * indicate a potential emergency and should be followed up as soon as possible.  Feel free to call the clinic should you have any questions or concerns. The clinic phone number is [phone].  Please show the CHEMO ALERT CARD at check-in to the Emergency Department and triage nurse.   

## 2018-11-16 LAB — CANCER ANTIGEN 19-9: CAN 19-9: 33 U/mL (ref 0–35)

## 2018-11-21 NOTE — Progress Notes (Signed)
Morgan Dawson   [phone] [phone]   Clinic Follow up Note   Patient Care Team: [name], MD as PCP - General (Family Medicine) [name], MD as Consulting Physician (Otolaryngology)  Date of Service:  11/22/2018  CHIEF COMPLAINT: F/u of pancreatic cancer  SUMMARY OF ONCOLOGIC HISTORY: Oncology History   Cancer Staging Pancreatic cancer [facility]) Staging form: Exocrine Pancreas, AJCC 8th Edition - Clinical stage from 08/06/2017: Stage IV (cTX, cN0, pM1) - Signed by [name], MD on 08/12/2017       Pancreatic cancer ([place])   07/23/2017 Imaging    US abdomen limited RUQ 07/23/17 IMPRESSION: 1. Cholelithiasis.  No secondary signs of acute cholecystitis. 2. Multiple solid liver masses measuring up to 6.5 cm in the left lobe of the liver, evaluation for metastatic disease is recommended. These results will be called to the ordering clinician or representative by the Radiologist Assistant, and communication documented in the PACS or zVision Dashboard.    07/23/2017 Imaging    CT Abdomen W Contrast 07/23/17 IMPRESSION: 1. Widespread metastatic disease throughout the liver. No clear primary malignancy identified in the abdomen. The pelvis was not imaged. Tissue sampling recommended. 2. Probable adenopathy superior to the pancreatic tail. No evidence of pancreatic mass. 3. Suspected incidental hemangioma inferiorly in the right hepatic lobe. 4. Nonspecific nodularity in the breasts. The patient has undergone recent (03/25/2017 and 04/01/2017) mammography and ultrasound.    07/29/2017 Initial Diagnosis    Metastasis to liver of unknown origin ([place])    07/29/2017 PET scan    PET 07/29/17  IMPRESSION: 1. Numerous bulky liver masses are hypermetabolic compatible with malignancy. 2. Accentuated activity within or along the pancreatic tail, likely represent a primary pancreatic tumor. Consider pancreatic protocol MRI to further work  this up. 3. The peripancreatic lymph node shown above the pancreatic tail is mildly hypermetabolic favoring malignancy. 4.  Prominent stool throughout the colon favors constipation. 5. 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    08/05/2017 Pathology Results    Liver Biopsy  Diagnosis 08/05/17 Liver, needle/core biopsy - CARCINOMA. - SEE COMMENT. Microscopic Comment The malignant cells are positive for cytokeratin 7. They are negative for arginase, CDX2, cytokeratin 20, estrogen receptor, GATA-3, GCDFP, Glypican 3, Hep Par 1, Napsin A, and TTF-1. This immunohistochemical is nonspecific. Possibly primary sources include pancreatobiliary and upper gastrointestinal. Radiologic correlation is necessary. Of note, organ specific markers (GATA-3, GCDFP-breast, TTF-1, Napsin A-lung, and CDX2-colon) are negative. (JBK:ecj 08/09/2017)    08/21/2017 - 02/10/2018 Chemotherapy    FOLFIRINOX every 2 weeks starting 08/21/17. Dose reduced due to neuropahty and cytopenia     10/14/2017 Imaging    CT CAP WO Contrast 10/14/17 IMPRESSION: Evidence of known numerous liver metastases without significant interval change. No other evidence of metastatic disease within the chest, abdomen or pelvis. Cholelithiasis. Tiny pericardial effusion.    12/02/2017 Genetic Testing    Negative for pathogenic mutation.  The genes analyzed were the 83 genes on [facility]'s Multi-Cancer panel (ALK, APC, ATM, AXIN2, BAP1, BARD1, BLM, BMPR1A, BRCA1, BRCA2, BRIP1, CASR, CDC73, CDH1, CDK4, CDKN1B, CDKN1C, CDKN2A, CEBPA, CHEK2, CTNNA1, DICER1, DIS3L2, EGFR, EPCAM, FH, FLCN, GATA2, GPC3, GREM1, HOXB13, HRAS, KIT, MAX, MEN1, MET, MITF, MLH1, MSH2, MSH3, MSH6, MUTYH, NBN, NF1, NF2, NTHL1, PALB2, PDGFRA, PHOX2B, PMS2, POLD1, POLE, POT1, PRKAR1A, PTCH1, PTEN, RAD50, RAD51C, RAD51D, RB1, RECQL4, RET, RUNX1, SDHA, SDHAF2, SDHB, SDHC, SDHD, SMAD4, SMARCA4, SMARCB1, SMARCE1, STK11, SUFU, TERC, TERT, TMEM127, TP53, TSC1, TSC2, VHL, WRN, WT1).  12/17/2017 PET scan    IMPRESSION: 1. Although the liver metastases are still visible on the CT images, they are significantly smaller and retain no abnormal metabolic activity, consistent with response to therapy. 2. No abnormal activity within the pancreas. 3. No disease progression identified. 4. Cholelithiasis.    02/21/2018 PET scan    02/21/2018 PET Scan  IMPRESSION: 1. There are 2 new small nodules identified within the right lung which measure up to 5 mm. These are too small to reliably characterize by PET-CT, but warrant close interval follow-up. 2. Again noted are multifocal liver metastasis. The target lesion within the left lobe is slightly decreased in size when compared with the previous exam. Similar to previous exam there is no abnormal hypermetabolism above background liver activity identified within the liver lesions. 3. Gallstones.    02/22/2018 - 08/25/2018 Chemotherapy    5-FU pump infusion and liposomal irinotecan, every 2 weeks.  First cycle dose reduced due to cytopenia in the travel. Stopped on 08/25/2018 due to disease progression.     05/17/2018 Imaging    05/17/2018 PET Scan IMPRESSION: 1. Mixed response. The 2 small right lung nodules have decreased in size in the interval. Single focus of increased uptake within the liver is new from 02/21/2018 and concerning for recurrent metabolically active liver metastasis. 2. Splenomegaly.  New from previous exam. 3. Diffusely increased bone marrow activity, likely reflecting treatment related changes.    09/01/2018 PET scan    PET 09/01/18 IMPRESSION: 1. Unfortunately there recurrence of hepatic metastasis with multiple new hypermetabolic lesions in LEFT and RIGHT hepatic lobe. 2. New extra hepatic site of malignancy which appears associated the mid pancreas. Difficult to define lesion on noncontrast exam. 3. Diffuse marrow activity is favored benign. 4. No evidence of pulmonary metastasis.    09/12/2018 -  Chemotherapy     second-line Gemcitabine and Abraxane for 2 weeks on, 1 week off starting 09/12/2018. Due to neuropathy, I will start her with dose reduced Abraxane.      Pancreatic cancer metastasized to liver ([place])   08/11/2017 Initial Diagnosis    Pancreatic cancer metastasized to liver ([place])    09/12/2018 -  Chemotherapy    second-line Gemcitabine and Abraxane for 2 weeks on, 1 week off starting 09/12/2018. Due to neuropathy, I will start her with dose reduced Abraxane.       CURRENT THERAPY:  Second-line Gemcitabine and Abraxane for 2 weeks on, 1 week off starting 09/12/2018   INTERVAL HISTORY:  LUCCA GREGGS is here for a follow up and treatment. She presents to the clinic today by herself. She notes she is doing well. She feels chemo is going well. She still has a spiked fever the night of infusion that goes to 102.4-102.6. She takes a Tylenol before bed and the fever will break. That has only happened the last 2 times. She notes her first 2 infusions she did have chills but did not check her temperature. This has not occurred again. She denies cough, congestion, muscle pain, or diarrhea.  She notes sores on her left distal arm appear 5 days ago and lately started to dry up and scab however, it started to bleed lately and does so when she takes Band-Aid off. Only stings    REVIEW OF SYSTEMS:   Constitutional: Denies fevers, chills or abnormal weight loss Eyes: Denies blurriness of vision Ears, nose, mouth, throat, and face: Denies mucositis or sore throat Respiratory: Denies cough, dyspnea or wheezes Cardiovascular: Denies palpitation, chest discomfort  or lower extremity swelling Gastrointestinal:  Denies nausea, heartburn or change in bowel habits Skin: Denies abnormal skin rashes (+) distal left arm sore, open and bleeding Lymphatics: Denies new lymphadenopathy or easy bruising Neurological:Denies numbness, tingling or new weaknesses Behavioral/Psych: Mood is stable, no new changes  All  other systems were reviewed with the patient and are negative.  MEDICAL HISTORY:  Past Medical History:  Diagnosis Date  . Genetic testing 12/02/2017   Multi-Cancer panel (83 genes) @ [facility] - No pathogenic mutations detected  . Meniere disease 2016  . Pancreatitis     SURGICAL HISTORY: Past Surgical History:  Procedure Laterality Date  . CERVICAL ABLATION    . ENDOMETRIAL ABLATION  2011  . IR FLUORO GUIDE PORT INSERTION RIGHT  08/12/2017  . IR US GUIDE VASC ACCESS RIGHT  08/12/2017  . [place]    I have reviewed the social history and family history with the patient and they are unchanged from previous note.  ALLERGIES:  has No Known Allergies.  MEDICATIONS:  Current Outpatient Medications  Medication Sig Dispense Refill  . gabapentin (NEURONTIN) 100 MG capsule Take 1 capsule (100 mg total) by mouth 3 (three) times daily. 90 capsule 2  . meclizine (ANTIVERT) 25 MG tablet Take 25 mg by mouth 3 (three) times daily as needed for dizziness.    . potassium chloride (K-DUR) 10 MEQ tablet Take 1 tablet (10 mEq total) by mouth 4 (four) times daily. 360 tablet 2  . loperamide (IMODIUM) 2 MG capsule Take 1 capsule (2 mg total) by mouth as needed for diarrhea or loose stools. (Patient not taking: Reported on 11/22/2018) 30 capsule 0  . loratadine (CLARITIN) 10 MG tablet Take 10 mg by mouth daily as needed for allergies.    . magic mouthwash w/lidocaine SOLN Take 10 mLs by mouth 3 (three) times daily as needed for mouth pain. Swish and spit 10 ML by mouth 3 times daily as needed for mouth pain. (Patient not taking: Reported on 11/22/2018) 240 mL 0  . ondansetron (ZOFRAN-ODT) 8 MG disintegrating tablet TAKE 1 TABLET BY MOUTH EVERY 8 HOURS AS NEEDED FOR NAUSE OR VOMITING (Patient not taking: Reported on 11/22/2018) 40 tablet 0  . prochlorperazine (COMPAZINE) 10 MG tablet Take 1 tablet (10 mg total) by mouth every 6 (six) hours as needed for nausea or vomiting. (Patient not taking:  Reported on 11/22/2018) 40 tablet 2  . traMADol (ULTRAM) 50 MG tablet Take 1 tablet (50 mg total) by mouth every 6 (six) hours as needed. (Patient not taking: Reported on 11/22/2018) 30 tablet 0   No current facility-administered medications for this visit.    Facility-Administered Medications Ordered in Other Visits  Medication Dose Route Frequency Provider Last Rate Last Dose  . sodium chloride flush (NS) 0.9 % injection 10 mL  10 mL Intracatheter PRN [name], MD   10 mL at 11/22/18 1537    PHYSICAL EXAMINATION: ECOG PERFORMANCE STATUS: 1 - Symptomatic but completely ambulatory  Vitals:   11/22/18 1147  BP: 114/71  Pulse: 77  Resp: 18  Temp: 98.5 F (36.9 C)  SpO2: 100%   Filed Weights   11/22/18 1147  Weight: 102 lb 9.6 oz (46.5 kg)    GENERAL:alert, no distress and comfortable SKIN: skin color, texture, turgor are normal, no rashes or significant lesions (+) distal left arm 1 cm shallow ulcer with bleeding EYES: normal, Conjunctiva are pink and non-injected, sclera clear OROPHARYNX:no exudate, no erythema and lips, buccal mucosa, and  tongue normal  NECK: supple, thyroid normal size, non-tender, without nodularity LYMPH:  no palpable lymphadenopathy in the cervical, axillary or inguinal LUNGS: clear to auscultation and percussion with normal breathing effort HEART: regular rate & rhythm and no murmurs and no lower extremity edema ABDOMEN:abdomen soft, non-tender and normal bowel sounds Musculoskeletal:no cyanosis of digits and no clubbing  NEURO: alert & oriented x 3 with fluent speech, no focal motor/sensory deficits  LABORATORY DATA:  I have reviewed the data as listed CBC Latest Ref Rng & Units 11/22/2018 11/15/2018 10/31/2018  WBC 4.0 - 10.5 K/uL 2.0(L) 4.7 2.6(L)  Hemoglobin 12.0 - 15.0 g/dL 9.1(L) 9.7(L) 9.2(L)  Hematocrit 36.0 - 46.0 % 28.5(L) 29.8(L) 28.7(L)  Platelets 150 - 400 K/uL 127(L) 184 135(L)     CMP Latest Ref Rng & Units 11/22/2018 11/15/2018 10/31/2018   Glucose 70 - 99 mg/dL 107(H) 114(H) 121(H)  BUN 6 - 20 mg/dL _0 Creatinine 0.44 - 1.00 mg/dL 1.08(H) 1.06(H) 1.12(H)  Sodium 135 - 145 mmol/L 142 140 140  Potassium 3.5 - 5.1 mmol/L 4.0 3.9 3.8  Chloride 98 - 111 mmol/L 106 103 104  CO2 22 - 32 mmol/L _1 Calcium 8.9 - 10.3 mg/dL 9.7 9.4 9.7  Total Protein 6.5 - 8.1 g/dL 7.8 7.7 7.7  Total Bilirubin 0.3 - 1.2 mg/dL 0.5 0.7 0.5  Alkaline Phos 38 - 126 U/L 292(H) 245(H) 305(H)  AST 15 - 41 U/L 69(H) 47(H) 77(H)  ALT 0 - 44 U/L 71(H) 43 64(H)      RADIOGRAPHIC STUDIES: I have personally reviewed the radiological images as listed and agreed with the findings in the report. No results found.   ASSESSMENT & PLAN:  Morgan Dawson is a 58 y.o. female with   1. 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 Treated with chemo.She was treated withfirst linechemoFOLFIRINOX with dose reduction. Due to neuropathyand cytopenia, chemo changed tosecond line 5-FU pump infusion and liposomal irinotecan every 2 weekswith dose reductiondue to cytopenia. -Unfortunately she recently had disease progression withworseningliver mets.  -She started second-line Gemcitabine and Abraxane2 weeks on/1 week offon 09/12/18. She tolerateswell with no significant side effects except hair loss and spiked fever after last 2 infusions.  -She is clinically improving and doing well. I will push her CT CAP to early May given the current COVID-19 outbreak  -Labs reviewed, CBC shows WBC 2, Hg 9.1, PLT 127K, ANC 1.1, CMP WNL except elevated liver enzymes, which are overall stable. Overall adequate to proceed with Gem/Abraxane today  -F/u in 2 weeks  2. Weight lossandmalnutrition -Due to chemo and underlying metastatic cancer. -Continue nutritional supplement and f/u with dietician. -Appetite and eating much improved. Weight continues to increase over 100lbs.  3. Transaminitis -Due  to underlying chemo and malignancy -Improved, overall mild and stable  4. Pancytopenia, secondary to chemo -Required chemo dose reduction. -S/p blood transfusion as needed with gh <8. Last on 09/20/18 -Labs reviewed,WBC 2, Hg at 9.1, PLT 127K. (10/3118)   5. Hypokalemia, CKD Stage III -Sheis currently on KCL supplements -Potassium normal today   6.Peripheral neuropathy, grade 2 -Currently on Neurontin 200 mg at night and B complexes. Continue. -She agreed to use ice bags with Abraxane infusion, continue -stable   7.Goal of care discussion -She is full code now -She understands her cancer is incurable at this stage, and the goal of therapy is palliative, to prolong his life, and prevent cancer related  symptoms.  8. Inguinal hernia  -I discussed given her cancer, surgeon will likely not proceed with hernia repair. She understands. -She notes starting to protrude more, no pain but uncomfortable. To prevent bowel becoming herniated,she has beenadvised her to lay down and push her hernia back in place when indiscomfort, If she coughsshe should hold in hernia and she should avoid any constipation.  -Stable   9. Distal left arm skin ulcer with bleeding -I encouraged her to keep it clean and dry with bandage. Will monitor.  -Will give dressing supplies today   10. Intermittent transit fever after chemo -Patient reports episode of fever at night of chemotherapy, with drenching night sweats, no additional chills, chest discomfort, or abdominal pain.  Fever resolved with Tylenol, no other residual symptoms and next day. This has happened after the last two dose of chemo. This is a possible related to chemotherapy -Patient is afebrile, labs and exam today are not suspicious for infection. Will continue monitoring   Plan -Labs reviewed and Overall adequate to proceed with C4D8 Gem/Abraxane today.  -lab, flush, f/u and Gem/Abraxanein 2 weeks    No problem-specific  Assessment & Plan notes found for this encounter.   No orders of the defined types were placed in this encounter.  All questions were answered. The patient knows to call the clinic with any problems, questions or concerns. No barriers to learning was detected. I spent 20 minutes counseling the patient face to face. The total time spent in the appointment was 25 minutes and more than 50% was on counseling and review of test results     [name], MD 11/22/2018   I, Joslyn Devon, am acting as scribe for [name], MD.   I have reviewed the above documentation for accuracy and completeness, and I agree with the above.

## 2018-11-22 ENCOUNTER — Inpatient Hospital Stay: Payer: 59

## 2018-11-22 ENCOUNTER — Other Ambulatory Visit: Payer: Self-pay

## 2018-11-22 ENCOUNTER — Encounter: Payer: Self-pay | Admitting: Hematology

## 2018-11-22 ENCOUNTER — Inpatient Hospital Stay (HOSPITAL_BASED_OUTPATIENT_CLINIC_OR_DEPARTMENT_OTHER): Payer: 59 | Admitting: Hematology

## 2018-11-22 VITALS — BP 114/71 | HR 77 | Temp 98.5°F | Resp 18 | Ht 65.0 in | Wt 102.6 lb

## 2018-11-22 DIAGNOSIS — Z95828 Presence of other vascular implants and grafts: Secondary | ICD-10-CM

## 2018-11-22 DIAGNOSIS — Z7189 Other specified counseling: Secondary | ICD-10-CM

## 2018-11-22 DIAGNOSIS — N183 Chronic kidney disease, stage 3 unspecified: Secondary | ICD-10-CM

## 2018-11-22 DIAGNOSIS — C259 Malignant neoplasm of pancreas, unspecified: Secondary | ICD-10-CM

## 2018-11-22 DIAGNOSIS — C787 Secondary malignant neoplasm of liver and intrahepatic bile duct: Secondary | ICD-10-CM | POA: Diagnosis not present

## 2018-11-22 DIAGNOSIS — E876 Hypokalemia: Secondary | ICD-10-CM

## 2018-11-22 DIAGNOSIS — D6481 Anemia due to antineoplastic chemotherapy: Secondary | ICD-10-CM

## 2018-11-22 DIAGNOSIS — G629 Polyneuropathy, unspecified: Secondary | ICD-10-CM

## 2018-11-22 DIAGNOSIS — L98491 Non-pressure chronic ulcer of skin of other sites limited to breakdown of skin: Secondary | ICD-10-CM

## 2018-11-22 DIAGNOSIS — R740 Nonspecific elevation of levels of transaminase and lactic acid dehydrogenase [LDH]: Secondary | ICD-10-CM | POA: Diagnosis not present

## 2018-11-22 DIAGNOSIS — C251 Malignant neoplasm of body of pancreas: Secondary | ICD-10-CM

## 2018-11-22 DIAGNOSIS — E46 Unspecified protein-calorie malnutrition: Secondary | ICD-10-CM | POA: Diagnosis not present

## 2018-11-22 DIAGNOSIS — K4090 Unilateral inguinal hernia, without obstruction or gangrene, not specified as recurrent: Secondary | ICD-10-CM

## 2018-11-22 DIAGNOSIS — T451X5A Adverse effect of antineoplastic and immunosuppressive drugs, initial encounter: Secondary | ICD-10-CM

## 2018-11-22 DIAGNOSIS — Z5111 Encounter for antineoplastic chemotherapy: Secondary | ICD-10-CM | POA: Diagnosis not present

## 2018-11-22 DIAGNOSIS — D61810 Antineoplastic chemotherapy induced pancytopenia: Secondary | ICD-10-CM

## 2018-11-22 LAB — COMPREHENSIVE METABOLIC PANEL
ALT: 71 U/L — ABNORMAL HIGH (ref 0–44)
AST: 69 U/L — ABNORMAL HIGH (ref 15–41)
Albumin: 3.5 g/dL (ref 3.5–5.0)
Alkaline Phosphatase: 292 U/L — ABNORMAL HIGH (ref 38–126)
Anion gap: 9 (ref 5–15)
BILIRUBIN TOTAL: 0.5 mg/dL (ref 0.3–1.2)
BUN: 19 mg/dL (ref 6–20)
CO2: 27 mmol/L (ref 22–32)
Calcium: 9.7 mg/dL (ref 8.9–10.3)
Chloride: 106 mmol/L (ref 98–111)
Creatinine, Ser: 1.08 mg/dL — ABNORMAL HIGH (ref 0.44–1.00)
GFR calc Af Amer: >60 mL/min (ref >60)
GFR calc non Af Amer: 58 mL/min — ABNORMAL LOW (ref >60)
Glucose, Bld: 107 mg/dL — ABNORMAL HIGH (ref 70–99)
Potassium: 4 mmol/L (ref 3.5–5.1)
Sodium: 142 mmol/L (ref 135–145)
Total Protein: 7.8 g/dL (ref 6.5–8.1)

## 2018-11-22 LAB — CBC WITH DIFFERENTIAL/PLATELET
Abs Immature Granulocytes: 0.05 10*3/uL (ref 0.00–0.07)
Basophils Absolute: 0 10*3/uL (ref 0.0–0.1)
Basophils Relative: 1 %
Eosinophils Absolute: 0 10*3/uL (ref 0.0–0.5)
Eosinophils Relative: 2 %
HEMATOCRIT: 28.5 % — AB (ref 36.0–46.0)
Hemoglobin: 9.1 g/dL — ABNORMAL LOW (ref 12.0–15.0)
Immature Granulocytes: 3 %
Lymphocytes Relative: 24 %
Lymphs Abs: 0.5 10*3/uL — ABNORMAL LOW (ref 0.7–4.0)
MCH: 34.3 pg — ABNORMAL HIGH (ref 26.0–34.0)
MCHC: 31.9 g/dL (ref 30.0–36.0)
MCV: 107.5 fL — ABNORMAL HIGH (ref 80.0–100.0)
MONO ABS: 0.3 10*3/uL (ref 0.1–1.0)
Monocytes Relative: 15 %
Neutro Abs: 1.1 10*3/uL — ABNORMAL LOW (ref 1.7–7.7)
Neutrophils Relative %: 55 %
Platelets: 127 10*3/uL — ABNORMAL LOW (ref 150–400)
RBC: 2.65 MIL/uL — ABNORMAL LOW (ref 3.87–5.11)
RDW: 15.8 % — ABNORMAL HIGH (ref 11.5–15.5)
WBC: 2 10*3/uL — AB (ref 4.0–10.5)
nRBC: 0 % (ref 0.0–0.2)

## 2018-11-22 MED ORDER — PROCHLORPERAZINE MALEATE 10 MG PO TABS
ORAL_TABLET | ORAL | Status: AC
  Filled 2018-11-22: qty 1

## 2018-11-22 MED ORDER — SODIUM CHLORIDE 0.9% FLUSH
10.0000 mL | Freq: Once | INTRAVENOUS | Status: AC
  Administered 2018-11-22: 10 mL
  Filled 2018-11-22: qty 10

## 2018-11-22 MED ORDER — PROCHLORPERAZINE MALEATE 10 MG PO TABS
10.0000 mg | ORAL_TABLET | Freq: Once | ORAL | Status: AC
  Administered 2018-11-22: 10 mg via ORAL

## 2018-11-22 MED ORDER — HEPARIN SOD (PORK) LOCK FLUSH 100 UNIT/ML IV SOLN
500.0000 [IU] | Freq: Once | INTRAVENOUS | Status: AC | PRN
  Administered 2018-11-22: 500 [IU]
  Filled 2018-11-22: qty 5

## 2018-11-22 MED ORDER — SODIUM CHLORIDE 0.9 % IV SOLN
Freq: Once | INTRAVENOUS | Status: AC
  Administered 2018-11-22: 13:00:00 via INTRAVENOUS
  Filled 2018-11-22: qty 250

## 2018-11-22 MED ORDER — SODIUM CHLORIDE 0.9 % IV SOLN
1000.0000 mg/m2 | Freq: Once | INTRAVENOUS | Status: AC
  Administered 2018-11-22: 1444 mg via INTRAVENOUS
  Filled 2018-11-22: qty 37.98

## 2018-11-22 MED ORDER — PACLITAXEL PROTEIN-BOUND CHEMO INJECTION 100 MG
125.0000 mg/m2 | Freq: Once | INTRAVENOUS | Status: AC
  Administered 2018-11-22: 175 mg via INTRAVENOUS
  Filled 2018-11-22: qty 35

## 2018-11-22 MED ORDER — SODIUM CHLORIDE 0.9% FLUSH
10.0000 mL | INTRAVENOUS | Status: DC | PRN
  Administered 2018-11-22: 10 mL
  Filled 2018-11-22: qty 10

## 2018-11-22 NOTE — Patient Instructions (Signed)
Coronavirus (COVID-19) Are you at risk?  Are you at risk for the Coronavirus (COVID-19)?  To be considered HIGH RISK for Coronavirus (COVID-19), you have to meet the following criteria:  . Traveled to [place], [place], [place], [place] or [place]; or in the [place] to [place], [place], [place], or [place]; and have fever, cough, and shortness of breath within the last 2 weeks of travel OR . Been in close contact with a person diagnosed with COVID-19 within the last 2 weeks and have fever, cough, and shortness of breath . IF YOU DO NOT MEET THESE CRITERIA, YOU ARE CONSIDERED LOW RISK FOR COVID-19.  What to do if you are HIGH RISK for COVID-19?  . If you are having a medical emergency, call 911. . Seek medical care right away. Before you go to a doctor's office, urgent care or emergency department, call ahead and tell them about your recent travel, contact with someone diagnosed with COVID-19, and your symptoms. You should receive instructions from your physician's office regarding next steps of care.  . When you arrive at healthcare provider, tell the healthcare staff immediately you have returned from visiting [place], [place], [place], [place] or [place]; or traveled in the [place] to [place], [place], [place], or [place]; in the last two weeks or you have been in close contact with a person diagnosed with COVID-19 in the last 2 weeks.   . Tell the health care staff about your symptoms: fever, cough and shortness of breath. . After you have been seen by a medical provider, you will be either: o Tested for (COVID-19) and discharged home on quarantine except to seek medical care if symptoms worsen, and asked to  - Stay home and avoid contact with others until you get your results (4-5 days)  - Avoid travel on public transportation if possible (such as bus, train, or airplane) or o Sent to the Emergency Department by EMS for evaluation, COVID-19 testing, and possible  admission depending on your condition and test results.  What to do if you are LOW RISK for COVID-19?  Reduce your risk of any infection by using the same precautions used for avoiding the common cold or flu:  . Wash your hands often with soap and warm water for at least 20 seconds.  If soap and water are not readily available, use an alcohol-based hand sanitizer with at least 60% alcohol.  . If coughing or sneezing, cover your mouth and nose by coughing or sneezing into the elbow areas of your shirt or coat, into a tissue or into your sleeve (not your hands). . Avoid shaking hands with others and consider head nods or verbal greetings only. . Avoid touching your eyes, nose, or mouth with unwashed hands.  . Avoid close contact with people who are sick. . Avoid places or events with large numbers of people in one location, like concerts or sporting events. . Carefully consider travel plans you have or are making. . If you are planning any travel outside or inside the [place], visit the CDC's Travelers' Health webpage for the latest health notices. . If you have some symptoms but not all symptoms, continue to monitor at home and seek medical attention if your symptoms worsen. . If you are having a medical emergency, call 911.   ADDITIONAL HEALTHCARE OPTIONS FOR PATIENTS  [facility] / e-Visit: [email or web address]         [facility]: [phone]  Onset   Urgent Care: [facility]: [place] Discharge Instructions for Patients Receiving Chemotherapy  Today you received the following chemotherapy agents:  Abraxane, Gemzar  To help prevent nausea and vomiting after your treatment, we encourage you to take your nausea medication as prescribed.   If you develop nausea and vomiting that is not controlled by your nausea medication, call the clinic.    BELOW ARE SYMPTOMS THAT SHOULD BE REPORTED IMMEDIATELY:  *FEVER GREATER THAN 100.5 F  *CHILLS WITH OR WITHOUT FEVER  NAUSEA AND VOMITING THAT IS NOT CONTROLLED WITH YOUR NAUSEA MEDICATION  *UNUSUAL SHORTNESS OF BREATH  *UNUSUAL BRUISING OR BLEEDING  TENDERNESS IN MOUTH AND THROAT WITH OR WITHOUT PRESENCE OF ULCERS  *URINARY PROBLEMS  *BOWEL PROBLEMS  UNUSUAL RASH Items with * indicate a potential emergency and should be followed up as soon as possible.  Feel free to call the clinic should you have any questions or concerns. The clinic phone number is [phone].  Please show the [place] at check-in to the Emergency Department and triage nurse.

## 2018-11-22 NOTE — Progress Notes (Signed)
Per Dr. Feng, ok to treat with ANC 1.1.  

## 2018-11-23 ENCOUNTER — Telehealth: Payer: Self-pay | Admitting: Hematology

## 2018-11-23 NOTE — Telephone Encounter (Signed)
No los per 3/31. °

## 2018-12-02 NOTE — Progress Notes (Signed)
Morgan Dawson   Telephone:(336) [phone] Fax:(336) [phone]   Clinic Follow up Note   Patient Care Team: [name], MD as PCP - General (Family Medicine) [name], MD as Consulting Physician (Otolaryngology)  Date of Service:  12/05/2018  CHIEF COMPLAINT: F/u of pancreatic cancer  SUMMARY OF ONCOLOGIC HISTORY: Oncology History   Cancer Staging Pancreatic cancer [facility]) Staging form: Exocrine Pancreas, AJCC 8th Edition - Clinical stage from 08/06/2017: Stage IV (cTX, cN0, pM1) - Signed by [name], MD on 08/12/2017       Pancreatic cancer ([place])   07/23/2017 Imaging    US abdomen limited RUQ 07/23/17 IMPRESSION: 1. Cholelithiasis.  No secondary signs of acute cholecystitis. 2. Multiple solid liver masses measuring up to 6.5 cm in the left lobe of the liver, evaluation for metastatic disease is recommended. These results will be called to the ordering clinician or representative by the Radiologist Assistant, and communication documented in the PACS or zVision Dashboard.    07/23/2017 Imaging    CT Abdomen W Contrast 07/23/17 IMPRESSION: 1. Widespread metastatic disease throughout the liver. No clear primary malignancy identified in the abdomen. The pelvis was not imaged. Tissue sampling recommended. 2. Probable adenopathy superior to the pancreatic tail. No evidence of pancreatic mass. 3. Suspected incidental hemangioma inferiorly in the right hepatic lobe. 4. Nonspecific nodularity in the breasts. The patient has undergone recent (03/25/2017 and 04/01/2017) mammography and ultrasound.    07/29/2017 Initial Diagnosis    Metastasis to liver of unknown origin ([place])    07/29/2017 PET scan    PET 07/29/17  IMPRESSION: 1. Numerous bulky liver masses are hypermetabolic compatible with malignancy. 2. Accentuated activity within or along the pancreatic tail, likely represent a primary pancreatic tumor. Consider pancreatic protocol MRI to further work  this up. 3. The peripancreatic lymph node shown above the pancreatic tail is mildly hypermetabolic favoring malignancy. 4.  Prominent stool throughout the colon favors constipation. 5. 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    08/05/2017 Pathology Results    Liver Biopsy  Diagnosis 08/05/17 Liver, needle/core biopsy - CARCINOMA. - SEE COMMENT. Microscopic Comment The malignant cells are positive for cytokeratin 7. They are negative for arginase, CDX2, cytokeratin 20, estrogen receptor, GATA-3, GCDFP, Glypican 3, Hep Par 1, Napsin A, and TTF-1. This immunohistochemical is nonspecific. Possibly primary sources include pancreatobiliary and upper gastrointestinal. Radiologic correlation is necessary. Of note, organ specific markers (GATA-3, GCDFP-breast, TTF-1, Napsin A-lung, and CDX2-colon) are negative. (JBK:ecj 08/09/2017)    08/21/2017 - 02/10/2018 Chemotherapy    FOLFIRINOX every 2 weeks starting 08/21/17. Dose reduced due to neuropahty and cytopenia     10/14/2017 Imaging    CT CAP WO Contrast 10/14/17 IMPRESSION: Evidence of known numerous liver metastases without significant interval change. No other evidence of metastatic disease within the chest, abdomen or pelvis. Cholelithiasis. Tiny pericardial effusion.    12/02/2017 Genetic Testing    Negative for pathogenic mutation.  The genes analyzed were the 83 genes on [facility]'s Multi-Cancer panel (ALK, APC, ATM, AXIN2, BAP1, BARD1, BLM, BMPR1A, BRCA1, BRCA2, BRIP1, CASR, CDC73, CDH1, CDK4, CDKN1B, CDKN1C, CDKN2A, CEBPA, CHEK2, CTNNA1, DICER1, DIS3L2, EGFR, EPCAM, FH, FLCN, GATA2, GPC3, GREM1, HOXB13, HRAS, KIT, MAX, MEN1, MET, MITF, MLH1, MSH2, MSH3, MSH6, MUTYH, NBN, NF1, NF2, NTHL1, PALB2, PDGFRA, PHOX2B, PMS2, POLD1, POLE, POT1, PRKAR1A, PTCH1, PTEN, RAD50, RAD51C, RAD51D, RB1, RECQL4, RET, RUNX1, SDHA, SDHAF2, SDHB, SDHC, SDHD, SMAD4, SMARCA4, SMARCB1, SMARCE1, STK11, SUFU, TERC, TERT, TMEM127, TP53, TSC1, TSC2, VHL, WRN, WT1).  12/17/2017 PET scan    IMPRESSION: 1. Although the liver metastases are still visible on the CT images, they are significantly smaller and retain no abnormal metabolic activity, consistent with response to therapy. 2. No abnormal activity within the pancreas. 3. No disease progression identified. 4. Cholelithiasis.    02/21/2018 PET scan    02/21/2018 PET Scan  IMPRESSION: 1. There are 2 new small nodules identified within the right lung which measure up to 5 mm. These are too small to reliably characterize by PET-CT, but warrant close interval follow-up. 2. Again noted are multifocal liver metastasis. The target lesion within the left lobe is slightly decreased in size when compared with the previous exam. Similar to previous exam there is no abnormal hypermetabolism above background liver activity identified within the liver lesions. 3. Gallstones.    02/22/2018 - 08/25/2018 Chemotherapy    5-FU pump infusion and liposomal irinotecan, every 2 weeks.  First cycle dose reduced due to cytopenia in the travel. Stopped on 08/25/2018 due to disease progression.     05/17/2018 Imaging    05/17/2018 PET Scan IMPRESSION: 1. Mixed response. The 2 small right lung nodules have decreased in size in the interval. Single focus of increased uptake within the liver is new from 02/21/2018 and concerning for recurrent metabolically active liver metastasis. 2. Splenomegaly.  New from previous exam. 3. Diffusely increased bone marrow activity, likely reflecting treatment related changes.    09/01/2018 PET scan    PET 09/01/18 IMPRESSION: 1. Unfortunately there recurrence of hepatic metastasis with multiple new hypermetabolic lesions in LEFT and RIGHT hepatic lobe. 2. New extra hepatic site of malignancy which appears associated the mid pancreas. Difficult to define lesion on noncontrast exam. 3. Diffuse marrow activity is favored benign. 4. No evidence of pulmonary metastasis.    09/12/2018 -  Chemotherapy     second-line Gemcitabine and Abraxane for 2 weeks on, 1 week off starting 09/12/2018. Due to neuropathy, I will start her with dose reduced Abraxane.      Pancreatic cancer metastasized to liver ([place])   08/11/2017 Initial Diagnosis    Pancreatic cancer metastasized to liver ([place])    09/12/2018 -  Chemotherapy    second-line Gemcitabine and Abraxane for 2 weeks on, 1 week off starting 09/12/2018. Due to neuropathy, I will start her with dose reduced Abraxane.       CURRENT THERAPY:  Second-line Gemcitabine and Abraxane for 2 weeks on, 1 week off starting 09/12/2018  INTERVAL HISTORY:  Morgan Dawson is here for a follow up and treatment. She presents to the clinic today by herself. She note she is doing well overall. She denies fever after last chemo, highest chemo was 99.7 the night of chemo. She notes her left distal arm ulcer is nearly healed. She denies any rash, mouth sores or any other issues.    REVIEW OF SYSTEMS:   Constitutional: Denies fevers, chills or abnormal weight loss Eyes: Denies blurriness of vision Ears, nose, mouth, throat, and face: Denies mucositis or sore throat Respiratory: Denies cough, dyspnea or wheezes Cardiovascular: Denies palpitation, chest discomfort or lower extremity swelling Gastrointestinal:  Denies nausea, heartburn or change in bowel habits Skin: Denies abnormal skin rashes (+) Left distal arm ulcer healing well.  Lymphatics: Denies new lymphadenopathy or easy bruising Neurological:Denies numbness, tingling or new weaknesses Behavioral/Psych: Mood is stable, no new changes  All other systems were reviewed with the patient and are negative.  MEDICAL HISTORY:  Past Medical History:  Diagnosis Date  Genetic testing 12/02/2017   Multi-Cancer panel (83 genes) @ [facility] - No pathogenic mutations detected   Meniere disease 2016   Pancreatitis     SURGICAL HISTORY: Past Surgical History:  Procedure Laterality Date   CERVICAL ABLATION       ENDOMETRIAL ABLATION  2011   IR FLUORO GUIDE PORT INSERTION RIGHT  08/12/2017   IR US GUIDE VASC ACCESS RIGHT  08/12/2017   MANDIBLE SURGERY  1999    I have reviewed the social history and family history with the patient and they are unchanged from previous note.  ALLERGIES:  has No Known Allergies.  MEDICATIONS:  Current Outpatient Medications  Medication Sig Dispense Refill   gabapentin (NEURONTIN) 100 MG capsule Take 1 capsule (100 mg total) by mouth 3 (three) times daily. 90 capsule 2   loperamide (IMODIUM) 2 MG capsule Take 1 capsule (2 mg total) by mouth as needed for diarrhea or loose stools. 30 capsule 0   loratadine (CLARITIN) 10 MG tablet Take 10 mg by mouth daily as needed for allergies.     magic mouthwash w/lidocaine SOLN Take 10 mLs by mouth 3 (three) times daily as needed for mouth pain. Swish and spit 10 ML by mouth 3 times daily as needed for mouth pain. 240 mL 0   meclizine (ANTIVERT) 25 MG tablet Take 25 mg by mouth 3 (three) times daily as needed for dizziness.     ondansetron (ZOFRAN-ODT) 8 MG disintegrating tablet TAKE 1 TABLET BY MOUTH EVERY 8 HOURS AS NEEDED FOR NAUSE OR VOMITING 40 tablet 0   potassium chloride (K-DUR) 10 MEQ tablet Take 1 tablet (10 mEq total) by mouth 4 (four) times daily. 360 tablet 2   prochlorperazine (COMPAZINE) 10 MG tablet Take 1 tablet (10 mg total) by mouth every 6 (six) hours as needed for nausea or vomiting. 40 tablet 2   traMADol (ULTRAM) 50 MG tablet Take 1 tablet (50 mg total) by mouth every 6 (six) hours as needed. 30 tablet 0   No current facility-administered medications for this visit.     PHYSICAL EXAMINATION: ECOG PERFORMANCE STATUS: 1 - Symptomatic but completely ambulatory  Vitals:   12/05/18 1113  BP: 113/69  Pulse: 75  Resp: 18  Temp: 98.3 F (36.8 C)  SpO2: 100%   Filed Weights   12/05/18 1113  Weight: 103 lb 12.8 oz (47.1 kg)    GENERAL:alert, no distress and comfortable SKIN: skin color,  texture, turgor are normal, no rashes or significant lesions (+) Left distal arm ulcer healing well  EYES: normal, Conjunctiva are pink and non-injected, sclera clear OROPHARYNX:no exudate, no erythema and lips, buccal mucosa, and tongue normal  NECK: supple, thyroid normal size, non-tender, without nodularity LYMPH:  no palpable lymphadenopathy in the cervical, axillary or inguinal LUNGS: clear to auscultation and percussion with normal breathing effort HEART: regular rate & rhythm and no murmurs and no lower extremity edema ABDOMEN:abdomen soft, non-tender and normal bowel sounds Musculoskeletal:no cyanosis of digits and no clubbing  NEURO: alert & oriented x 3 with fluent speech, no focal motor/sensory deficits  LABORATORY DATA:  I have reviewed the data as listed CBC Latest Ref Rng & Units 12/05/2018 11/22/2018 11/15/2018  WBC 4.0 - 10.5 K/uL 3.3(L) 2.0(L) 4.7  Hemoglobin 12.0 - 15.0 g/dL 9.8(L) 9.1(L) 9.7(L)  Hematocrit 36.0 - 46.0 % 30.1(L) 28.5(L) 29.8(L)  Platelets 150 - 400 K/uL 126(L) 127(L) 184     CMP Latest Ref Rng & Units 12/05/2018 11/22/2018 11/15/2018  Glucose 70 -  99 mg/dL 110(H) 107(H) 114(H)  BUN 6 - 20 mg/dL 23(H) 19 18  Creatinine 0.44 - 1.00 mg/dL 1.08(H) 1.08(H) 1.06(H)  Sodium 135 - 145 mmol/L 140 142 140  Potassium 3.5 - 5.1 mmol/L 3.8 4.0 3.9  Chloride 98 - 111 mmol/L 105 106 103  CO2 22 - 32 mmol/L 27 27 25   Calcium 8.9 - 10.3 mg/dL 9.5 9.7 9.4  Total Protein 6.5 - 8.1 g/dL 7.7 7.8 7.7  Total Bilirubin 0.3 - 1.2 mg/dL 0.5 0.5 0.7  Alkaline Phos 38 - 126 U/L 204(H) 292(H) 245(H)  AST 15 - 41 U/L 56(H) 69(H) 47(H)  ALT 0 - 44 U/L 58(H) 71(H) 43      RADIOGRAPHIC STUDIES: I have personally reviewed the radiological images as listed and agreed with the findings in the report. No results found.   ASSESSMENT & PLAN:  Morgan Dawson is a 58 y.o. female with   1. 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 Treated with chemo.She was treated withfirst linechemoFOLFIRINOX with dose reduction. Due to neuropathyand cytopenia, chemo changed tosecond line 5-FU pump infusion and liposomal irinotecan every 2 weekswith dose reductiondue to cytopenia. -Unfortunately she recently had disease progression withworseningliver mets.  -She started second-line Gemcitabine and Abraxane2 weeks on/1 week offon 09/12/18. She tolerateswell with no significant side effects except hair loss and spiked fever after last 2 infusions. Much less after cycle 4 infusion.  -She is clinically stable and doing well. Plan for restaging CT CAP in 2-3 weeks. This has been slightly postponed due to current COVID 19 pandemic, plus she is clinically doing well  -her CA19.9 has came down to normal range again, indication probably good response to chemo -Labs reviewed, CBC WNL except stable pancytopenia, ANC 2.2, CMP shows stable transaminitis. Overall adequate to proceed with Gem/Abraxanetoday  -F/u in3weeks  2. Weight lossandmalnutrition -Due to chemo and underlying metastatic cancer. -Continue nutritional supplement and f/u with dietician. -Appetite and eating much improved. Weight continues to increase over 100lbs.  3. Transaminitis -Due to underlying chemo and malignancy -Improved, overall mild and stable  4. Pancytopenia, secondary to chemo -Required chemo dose reduction. -S/p blood transfusion as needed with gh <8. Last on 09/20/18 -Labs reviewed,WBC3.3, Hg at 9.8, PLT 126K. (11/1318)   5. Hypokalemia, CKD Stage III -Sheis currently on KCL supplements  6.Peripheral neuropathy, grade 2 -Currently on Neurontin 200 mg at night and B complexes. Continue. -She agreed to use ice bags with Abraxane infusion, continue -stable  7.Goal of care discussion -She is full code now -She understands her cancer is incurable at this stage, and the goal of therapy is palliative, to prolong his life,  and prevent cancer related symptoms.  8. Inguinal hernia  -I discussed given her cancer, surgeon will likely not proceed with hernia repair. She understands. -She notes starting to protrude more, no pain but uncomfortable. To prevent bowel becoming herniated,she has beenadvised her to lay down and push her hernia back in place when indiscomfort, If she coughsshe should hold in hernia and she should avoid any constipation. -Stable  9. Distal left arm skin ulcer with bleeding -completed healed now   10. Intermittent transit fever after chemo -Patient reports episode of fever at night of chemotherapy, with drenching night sweats, no additional chills, chest discomfort, or abdominal pain.  Fever resolved with Tylenol, no other residual symptoms and next day. This is a possible related to chemotherapy -Patient is afebrile, had low grade T99.7  the night after last chemo, resolved quickly    Plan -Labs reviewed and Overalladequateto proceed withC5D1 Gem/Abraxane today, and D8 chemo next week -lab, flush, f/u and Gem/Abraxanein3weeks  -CT CAP with contrast in GI in 2-3 weeks (will hold IV contrast if EGFR<50)     No problem-specific Assessment & Plan notes found for this encounter.   Orders Placed This Encounter  Procedures   CT Abdomen Pelvis W Contrast    Standing Status:   Future    Standing Expiration Date:   12/05/2019    Order Specific Question:   If indicated for the ordered procedure, I authorize the administration of contrast media per Radiology protocol    Answer:   Yes    Order Specific Question:   Is patient pregnant?    Answer:   No    Order Specific Question:   Preferred imaging location?    Answer:   [facility]    Order Specific Question:   Is Oral Contrast requested for this exam?    Answer:   Yes, Per Radiology protocol    Order Specific Question:   Radiology Contrast Protocol - do NOT remove file path    Answer:    \[email or web address]   CT Chest W Contrast    Hold iv contrast if EGFR<50    Standing Status:   Future    Standing Expiration Date:   12/05/2019    Order Specific Question:   If indicated for the ordered procedure, I authorize the administration of contrast media per Radiology protocol    Answer:   Yes    Order Specific Question:   Is patient pregnant?    Answer:   No    Order Specific Question:   Preferred imaging location?    Answer:   [facility]    Order Specific Question:   Radiology Contrast Protocol - do NOT remove file path    Answer:   \[email or web address]   All questions were answered. The patient knows to call the clinic with any problems, questions or concerns. No barriers to learning was detected. I spent 20 minutes counseling the patient face to face. The total time spent in the appointment was 25 minutes and more than 50% was on counseling and review of test results     [name], MD 12/05/2018   I, Joslyn Devon, am acting as scribe for [name], MD.   I have reviewed the above documentation for accuracy and completeness, and I agree with the above.

## 2018-12-05 ENCOUNTER — Telehealth: Payer: Self-pay | Admitting: Hematology

## 2018-12-05 ENCOUNTER — Encounter: Payer: Self-pay | Admitting: Hematology

## 2018-12-05 ENCOUNTER — Other Ambulatory Visit: Payer: Self-pay

## 2018-12-05 ENCOUNTER — Inpatient Hospital Stay: Payer: 59 | Attending: Hematology

## 2018-12-05 ENCOUNTER — Inpatient Hospital Stay (HOSPITAL_BASED_OUTPATIENT_CLINIC_OR_DEPARTMENT_OTHER): Payer: 59 | Admitting: Hematology

## 2018-12-05 ENCOUNTER — Inpatient Hospital Stay: Payer: 59

## 2018-12-05 VITALS — BP 113/69 | HR 75 | Temp 98.3°F | Resp 18 | Ht 65.0 in | Wt 103.8 lb

## 2018-12-05 DIAGNOSIS — C259 Malignant neoplasm of pancreas, unspecified: Secondary | ICD-10-CM | POA: Diagnosis not present

## 2018-12-05 DIAGNOSIS — C251 Malignant neoplasm of body of pancreas: Secondary | ICD-10-CM | POA: Diagnosis not present

## 2018-12-05 DIAGNOSIS — D696 Thrombocytopenia, unspecified: Secondary | ICD-10-CM

## 2018-12-05 DIAGNOSIS — N183 Chronic kidney disease, stage 3 (moderate): Secondary | ICD-10-CM | POA: Insufficient documentation

## 2018-12-05 DIAGNOSIS — G620 Drug-induced polyneuropathy: Secondary | ICD-10-CM | POA: Diagnosis not present

## 2018-12-05 DIAGNOSIS — Z79899 Other long term (current) drug therapy: Secondary | ICD-10-CM | POA: Insufficient documentation

## 2018-12-05 DIAGNOSIS — Z5111 Encounter for antineoplastic chemotherapy: Secondary | ICD-10-CM | POA: Insufficient documentation

## 2018-12-05 DIAGNOSIS — E876 Hypokalemia: Secondary | ICD-10-CM | POA: Diagnosis not present

## 2018-12-05 DIAGNOSIS — C787 Secondary malignant neoplasm of liver and intrahepatic bile duct: Secondary | ICD-10-CM | POA: Insufficient documentation

## 2018-12-05 DIAGNOSIS — T451X5D Adverse effect of antineoplastic and immunosuppressive drugs, subsequent encounter: Secondary | ICD-10-CM | POA: Diagnosis not present

## 2018-12-05 DIAGNOSIS — Z95828 Presence of other vascular implants and grafts: Secondary | ICD-10-CM

## 2018-12-05 DIAGNOSIS — Z9221 Personal history of antineoplastic chemotherapy: Secondary | ICD-10-CM | POA: Insufficient documentation

## 2018-12-05 DIAGNOSIS — Z7189 Other specified counseling: Secondary | ICD-10-CM

## 2018-12-05 LAB — CBC WITH DIFFERENTIAL/PLATELET
Abs Immature Granulocytes: 0.01 10*3/uL (ref 0.00–0.07)
Basophils Absolute: 0 10*3/uL (ref 0.0–0.1)
Basophils Relative: 1 %
Eosinophils Absolute: 0.1 10*3/uL (ref 0.0–0.5)
Eosinophils Relative: 3 %
HCT: 30.1 % — ABNORMAL LOW (ref 36.0–46.0)
Hemoglobin: 9.8 g/dL — ABNORMAL LOW (ref 12.0–15.0)
Immature Granulocytes: 0 %
Lymphocytes Relative: 15 %
Lymphs Abs: 0.5 10*3/uL — ABNORMAL LOW (ref 0.7–4.0)
MCH: 34.8 pg — ABNORMAL HIGH (ref 26.0–34.0)
MCHC: 32.6 g/dL (ref 30.0–36.0)
MCV: 106.7 fL — ABNORMAL HIGH (ref 80.0–100.0)
Monocytes Absolute: 0.5 10*3/uL (ref 0.1–1.0)
Monocytes Relative: 15 %
Neutro Abs: 2.2 10*3/uL (ref 1.7–7.7)
Neutrophils Relative %: 66 %
Platelets: 126 10*3/uL — ABNORMAL LOW (ref 150–400)
RBC: 2.82 MIL/uL — ABNORMAL LOW (ref 3.87–5.11)
RDW: 16.2 % — ABNORMAL HIGH (ref 11.5–15.5)
WBC: 3.3 10*3/uL — ABNORMAL LOW (ref 4.0–10.5)
nRBC: 0 % (ref 0.0–0.2)

## 2018-12-05 LAB — COMPREHENSIVE METABOLIC PANEL
ALT: 58 U/L — ABNORMAL HIGH (ref 0–44)
AST: 56 U/L — ABNORMAL HIGH (ref 15–41)
Albumin: 3.6 g/dL (ref 3.5–5.0)
Alkaline Phosphatase: 204 U/L — ABNORMAL HIGH (ref 38–126)
Anion gap: 8 (ref 5–15)
BUN: 23 mg/dL — ABNORMAL HIGH (ref 6–20)
CO2: 27 mmol/L (ref 22–32)
Calcium: 9.5 mg/dL (ref 8.9–10.3)
Chloride: 105 mmol/L (ref 98–111)
Creatinine, Ser: 1.08 mg/dL — ABNORMAL HIGH (ref 0.44–1.00)
GFR calc Af Amer: >60 mL/min (ref >60)
GFR calc non Af Amer: 58 mL/min — ABNORMAL LOW (ref >60)
Glucose, Bld: 110 mg/dL — ABNORMAL HIGH (ref 70–99)
Potassium: 3.8 mmol/L (ref 3.5–5.1)
Sodium: 140 mmol/L (ref 135–145)
Total Bilirubin: 0.5 mg/dL (ref 0.3–1.2)
Total Protein: 7.7 g/dL (ref 6.5–8.1)

## 2018-12-05 MED ORDER — SODIUM CHLORIDE 0.9% FLUSH
10.0000 mL | INTRAVENOUS | Status: DC | PRN
  Administered 2018-12-05: 10 mL
  Filled 2018-12-05: qty 10

## 2018-12-05 MED ORDER — PROCHLORPERAZINE MALEATE 10 MG PO TABS
10.0000 mg | ORAL_TABLET | Freq: Once | ORAL | Status: AC
  Administered 2018-12-05: 12:00:00 10 mg via ORAL

## 2018-12-05 MED ORDER — PROCHLORPERAZINE MALEATE 10 MG PO TABS
ORAL_TABLET | ORAL | Status: AC
  Filled 2018-12-05: qty 1

## 2018-12-05 MED ORDER — SODIUM CHLORIDE 0.9% FLUSH
10.0000 mL | Freq: Once | INTRAVENOUS | Status: AC
  Administered 2018-12-05: 10 mL
  Filled 2018-12-05: qty 10

## 2018-12-05 MED ORDER — HEPARIN SOD (PORK) LOCK FLUSH 100 UNIT/ML IV SOLN
500.0000 [IU] | Freq: Once | INTRAVENOUS | Status: AC | PRN
  Administered 2018-12-05: 15:00:00 500 [IU]
  Filled 2018-12-05: qty 5

## 2018-12-05 MED ORDER — SODIUM CHLORIDE 0.9 % IV SOLN
1000.0000 mg/m2 | Freq: Once | INTRAVENOUS | Status: AC
  Administered 2018-12-05: 14:00:00 1444 mg via INTRAVENOUS
  Filled 2018-12-05: qty 37.98

## 2018-12-05 MED ORDER — SODIUM CHLORIDE 0.9 % IV SOLN
Freq: Once | INTRAVENOUS | Status: AC
  Administered 2018-12-05: 12:00:00 via INTRAVENOUS
  Filled 2018-12-05: qty 250

## 2018-12-05 MED ORDER — PACLITAXEL PROTEIN-BOUND CHEMO INJECTION 100 MG
125.0000 mg/m2 | Freq: Once | INTRAVENOUS | Status: AC
  Administered 2018-12-05: 13:00:00 175 mg via INTRAVENOUS
  Filled 2018-12-05: qty 35

## 2018-12-05 NOTE — Patient Instructions (Signed)
Coronavirus (COVID-19) Are you at risk?  Are you at risk for the Coronavirus (COVID-19)?  To be considered HIGH RISK for Coronavirus (COVID-19), you have to meet the following criteria:  . Traveled to [place], [place], [place], [place] or [place]; or in the [place] to [place], [place], [place], or [place]; and have fever, cough, and shortness of breath within the last 2 weeks of travel OR . Been in close contact with a person diagnosed with COVID-19 within the last 2 weeks and have fever, cough, and shortness of breath . IF YOU DO NOT MEET THESE CRITERIA, YOU ARE CONSIDERED LOW RISK FOR COVID-19.  What to do if you are HIGH RISK for COVID-19?  . If you are having a medical emergency, call 911. . Seek medical care right away. Before you go to a doctor's office, urgent care or emergency department, call ahead and tell them about your recent travel, contact with someone diagnosed with COVID-19, and your symptoms. You should receive instructions from your physician's office regarding next steps of care.  . When you arrive at healthcare provider, tell the healthcare staff immediately you have returned from visiting [place], [place], [place], [place] or [place]; or traveled in the [place] to [place], [place], [place], or [place]; in the last two weeks or you have been in close contact with a person diagnosed with COVID-19 in the last 2 weeks.   . Tell the health care staff about your symptoms: fever, cough and shortness of breath. . After you have been seen by a medical provider, you will be either: o Tested for (COVID-19) and discharged home on quarantine except to seek medical care if symptoms worsen, and asked to  - Stay home and avoid contact with others until you get your results (4-5 days)  - Avoid travel on public transportation if possible (such as bus, train, or airplane) or o Sent to the Emergency Department by EMS for evaluation, COVID-19 testing, and possible  admission depending on your condition and test results.  What to do if you are LOW RISK for COVID-19?  Reduce your risk of any infection by using the same precautions used for avoiding the common cold or flu:  . Wash your hands often with soap and warm water for at least 20 seconds.  If soap and water are not readily available, use an alcohol-based hand sanitizer with at least 60% alcohol.  . If coughing or sneezing, cover your mouth and nose by coughing or sneezing into the elbow areas of your shirt or coat, into a tissue or into your sleeve (not your hands). . Avoid shaking hands with others and consider head nods or verbal greetings only. . Avoid touching your eyes, nose, or mouth with unwashed hands.  . Avoid close contact with people who are sick. . Avoid places or events with large numbers of people in one location, like concerts or sporting events. . Carefully consider travel plans you have or are making. . If you are planning any travel outside or inside the [place], visit the CDC's Travelers' Health webpage for the latest health notices. . If you have some symptoms but not all symptoms, continue to monitor at home and seek medical attention if your symptoms worsen. . If you are having a medical emergency, call 911.   ADDITIONAL HEALTHCARE OPTIONS FOR PATIENTS  [facility] / e-Visit: [email or web address]         [facility]: [phone]  Onset   Urgent Care: [facility]: [place] Discharge Instructions for Patients Receiving Chemotherapy  Today you received the following chemotherapy agents:  Abraxane, Gemzar  To help prevent nausea and vomiting after your treatment, we encourage you to take your nausea medication as prescribed.   If you develop nausea and vomiting that is not controlled by your nausea medication, call the clinic.    BELOW ARE SYMPTOMS THAT SHOULD BE REPORTED IMMEDIATELY:  *FEVER GREATER THAN 100.5 F  *CHILLS WITH OR WITHOUT FEVER  NAUSEA AND VOMITING THAT IS NOT CONTROLLED WITH YOUR NAUSEA MEDICATION  *UNUSUAL SHORTNESS OF BREATH  *UNUSUAL BRUISING OR BLEEDING  TENDERNESS IN MOUTH AND THROAT WITH OR WITHOUT PRESENCE OF ULCERS  *URINARY PROBLEMS  *BOWEL PROBLEMS  UNUSUAL RASH Items with * indicate a potential emergency and should be followed up as soon as possible.  Feel free to call the clinic should you have any questions or concerns. The clinic phone number is [phone].  Please show the [place] at check-in to the Emergency Department and triage nurse.

## 2018-12-05 NOTE — Telephone Encounter (Signed)
[facility] will contact patient about scan appt.

## 2018-12-12 ENCOUNTER — Inpatient Hospital Stay: Payer: 59

## 2018-12-12 ENCOUNTER — Other Ambulatory Visit: Payer: Self-pay

## 2018-12-12 VITALS — BP 112/74 | HR 75 | Temp 98.5°F | Resp 18

## 2018-12-12 DIAGNOSIS — C251 Malignant neoplasm of body of pancreas: Secondary | ICD-10-CM

## 2018-12-12 DIAGNOSIS — N183 Chronic kidney disease, stage 3 (moderate): Secondary | ICD-10-CM | POA: Diagnosis present

## 2018-12-12 DIAGNOSIS — D539 Nutritional anemia, unspecified: Secondary | ICD-10-CM | POA: Diagnosis present

## 2018-12-12 DIAGNOSIS — C259 Malignant neoplasm of pancreas, unspecified: Secondary | ICD-10-CM | POA: Diagnosis present

## 2018-12-12 DIAGNOSIS — Z803 Family history of malignant neoplasm of breast: Secondary | ICD-10-CM

## 2018-12-12 DIAGNOSIS — Z800 Family history of malignant neoplasm of digestive organs: Secondary | ICD-10-CM

## 2018-12-12 DIAGNOSIS — Z79891 Long term (current) use of opiate analgesic: Secondary | ICD-10-CM

## 2018-12-12 DIAGNOSIS — C787 Secondary malignant neoplasm of liver and intrahepatic bile duct: Secondary | ICD-10-CM

## 2018-12-12 DIAGNOSIS — D6481 Anemia due to antineoplastic chemotherapy: Secondary | ICD-10-CM | POA: Diagnosis present

## 2018-12-12 DIAGNOSIS — I82442 Acute embolism and thrombosis of left tibial vein: Secondary | ICD-10-CM | POA: Diagnosis present

## 2018-12-12 DIAGNOSIS — K8000 Calculus of gallbladder with acute cholecystitis without obstruction: Principal | ICD-10-CM | POA: Diagnosis present

## 2018-12-12 DIAGNOSIS — D6959 Other secondary thrombocytopenia: Secondary | ICD-10-CM | POA: Diagnosis present

## 2018-12-12 DIAGNOSIS — Z95828 Presence of other vascular implants and grafts: Secondary | ICD-10-CM

## 2018-12-12 DIAGNOSIS — Z808 Family history of malignant neoplasm of other organs or systems: Secondary | ICD-10-CM

## 2018-12-12 DIAGNOSIS — Z807 Family history of other malignant neoplasms of lymphoid, hematopoietic and related tissues: Secondary | ICD-10-CM

## 2018-12-12 DIAGNOSIS — Z79899 Other long term (current) drug therapy: Secondary | ICD-10-CM

## 2018-12-12 DIAGNOSIS — D630 Anemia in neoplastic disease: Secondary | ICD-10-CM | POA: Diagnosis present

## 2018-12-12 DIAGNOSIS — E876 Hypokalemia: Secondary | ICD-10-CM | POA: Diagnosis not present

## 2018-12-12 DIAGNOSIS — R946 Abnormal results of thyroid function studies: Secondary | ICD-10-CM | POA: Diagnosis not present

## 2018-12-12 DIAGNOSIS — K4090 Unilateral inguinal hernia, without obstruction or gangrene, not specified as recurrent: Secondary | ICD-10-CM | POA: Diagnosis present

## 2018-12-12 DIAGNOSIS — D61810 Antineoplastic chemotherapy induced pancytopenia: Secondary | ICD-10-CM | POA: Diagnosis present

## 2018-12-12 DIAGNOSIS — K0889 Other specified disorders of teeth and supporting structures: Secondary | ICD-10-CM | POA: Diagnosis present

## 2018-12-12 DIAGNOSIS — R1011 Right upper quadrant pain: Secondary | ICD-10-CM | POA: Diagnosis not present

## 2018-12-12 DIAGNOSIS — D62 Acute posthemorrhagic anemia: Secondary | ICD-10-CM | POA: Diagnosis not present

## 2018-12-12 DIAGNOSIS — E440 Moderate protein-calorie malnutrition: Secondary | ICD-10-CM | POA: Diagnosis not present

## 2018-12-12 DIAGNOSIS — Z7189 Other specified counseling: Secondary | ICD-10-CM

## 2018-12-12 DIAGNOSIS — Z681 Body mass index (BMI) 19 or less, adult: Secondary | ICD-10-CM

## 2018-12-12 LAB — CBC WITH DIFFERENTIAL/PLATELET
Abs Immature Granulocytes: 0.01 10*3/uL (ref 0.00–0.07)
Basophils Absolute: 0 10*3/uL (ref 0.0–0.1)
Basophils Relative: 1 %
Eosinophils Absolute: 0 10*3/uL (ref 0.0–0.5)
Eosinophils Relative: 1 %
HCT: 26.8 % — ABNORMAL LOW (ref 36.0–46.0)
Hemoglobin: 8.8 g/dL — ABNORMAL LOW (ref 12.0–15.0)
Immature Granulocytes: 1 %
Lymphocytes Relative: 23 %
Lymphs Abs: 0.5 10*3/uL — ABNORMAL LOW (ref 0.7–4.0)
MCH: 35.1 pg — ABNORMAL HIGH (ref 26.0–34.0)
MCHC: 32.8 g/dL (ref 30.0–36.0)
MCV: 106.8 fL — ABNORMAL HIGH (ref 80.0–100.0)
Monocytes Absolute: 0.4 10*3/uL (ref 0.1–1.0)
Monocytes Relative: 20 %
Neutro Abs: 1.1 10*3/uL — ABNORMAL LOW (ref 1.7–7.7)
Neutrophils Relative %: 54 %
Platelets: 120 10*3/uL — ABNORMAL LOW (ref 150–400)
RBC: 2.51 MIL/uL — ABNORMAL LOW (ref 3.87–5.11)
RDW: 14.7 % (ref 11.5–15.5)
WBC: 2 10*3/uL — ABNORMAL LOW (ref 4.0–10.5)
nRBC: 0 % (ref 0.0–0.2)

## 2018-12-12 LAB — CMP (CANCER CENTER ONLY)
ALT: 53 U/L — ABNORMAL HIGH (ref 0–44)
AST: 48 U/L — ABNORMAL HIGH (ref 15–41)
Albumin: 3.4 g/dL — ABNORMAL LOW (ref 3.5–5.0)
Alkaline Phosphatase: 317 U/L — ABNORMAL HIGH (ref 38–126)
Anion gap: 8 (ref 5–15)
BUN: 21 mg/dL — ABNORMAL HIGH (ref 6–20)
CO2: 27 mmol/L (ref 22–32)
Calcium: 9.3 mg/dL (ref 8.9–10.3)
Chloride: 103 mmol/L (ref 98–111)
Creatinine: 1.04 mg/dL — ABNORMAL HIGH (ref 0.44–1.00)
GFR, Est AFR Am: >60 mL/min (ref >60)
GFR, Estimated: >60 mL/min (ref >60)
Glucose, Bld: 110 mg/dL — ABNORMAL HIGH (ref 70–99)
Potassium: 3.8 mmol/L (ref 3.5–5.1)
Sodium: 138 mmol/L (ref 135–145)
Total Bilirubin: 0.5 mg/dL (ref 0.3–1.2)
Total Protein: 7.7 g/dL (ref 6.5–8.1)

## 2018-12-12 MED ORDER — SODIUM CHLORIDE 0.9 % IV SOLN
Freq: Once | INTRAVENOUS | Status: AC
  Administered 2018-12-12: 12:00:00 via INTRAVENOUS
  Filled 2018-12-12: qty 250

## 2018-12-12 MED ORDER — PACLITAXEL PROTEIN-BOUND CHEMO INJECTION 100 MG
125.0000 mg/m2 | Freq: Once | INTRAVENOUS | Status: AC
  Administered 2018-12-12: 13:00:00 175 mg via INTRAVENOUS
  Filled 2018-12-12: qty 35

## 2018-12-12 MED ORDER — SODIUM CHLORIDE 0.9% FLUSH
10.0000 mL | INTRAVENOUS | Status: DC | PRN
  Administered 2018-12-12: 10 mL
  Filled 2018-12-12: qty 10

## 2018-12-12 MED ORDER — PROCHLORPERAZINE MALEATE 10 MG PO TABS
ORAL_TABLET | ORAL | Status: AC
  Filled 2018-12-12: qty 1

## 2018-12-12 MED ORDER — HEPARIN SOD (PORK) LOCK FLUSH 100 UNIT/ML IV SOLN
500.0000 [IU] | Freq: Once | INTRAVENOUS | Status: AC | PRN
  Administered 2018-12-12: 15:00:00 500 [IU]
  Filled 2018-12-12: qty 5

## 2018-12-12 MED ORDER — PROCHLORPERAZINE MALEATE 10 MG PO TABS
10.0000 mg | ORAL_TABLET | Freq: Once | ORAL | Status: AC
  Administered 2018-12-12: 10 mg via ORAL

## 2018-12-12 MED ORDER — SODIUM CHLORIDE 0.9 % IV SOLN
1000.0000 mg/m2 | Freq: Once | INTRAVENOUS | Status: AC
  Administered 2018-12-12: 1444 mg via INTRAVENOUS
  Filled 2018-12-12: qty 37.98

## 2018-12-12 MED ORDER — SODIUM CHLORIDE 0.9% FLUSH
10.0000 mL | Freq: Once | INTRAVENOUS | Status: AC
  Administered 2018-12-12: 10 mL
  Filled 2018-12-12: qty 10

## 2018-12-12 NOTE — Progress Notes (Signed)
OK to treat with ANC and lab values today per MD Burr Medico

## 2018-12-12 NOTE — Patient Instructions (Signed)
Coronavirus (COVID-19) Are you at risk?  Are you at risk for the Coronavirus (COVID-19)?  To be considered HIGH RISK for Coronavirus (COVID-19), you have to meet the following criteria:  . Traveled to [place], [place], [place], [place] or [place]; or in the [place] to [place], [place], [place], or [place]; and have fever, cough, and shortness of breath within the last 2 weeks of travel OR . Been in close contact with a person diagnosed with COVID-19 within the last 2 weeks and have fever, cough, and shortness of breath . IF YOU DO NOT MEET THESE CRITERIA, YOU ARE CONSIDERED LOW RISK FOR COVID-19.  What to do if you are HIGH RISK for COVID-19?  . If you are having a medical emergency, call 911. . Seek medical care right away. Before you go to a doctor's office, urgent care or emergency department, call ahead and tell them about your recent travel, contact with someone diagnosed with COVID-19, and your symptoms. You should receive instructions from your physician's office regarding next steps of care.  . When you arrive at healthcare provider, tell the healthcare staff immediately you have returned from visiting [place], [place], [place], [place] or [place]; or traveled in the [place] to [place], [place], [place], or [place]; in the last two weeks or you have been in close contact with a person diagnosed with COVID-19 in the last 2 weeks.   . Tell the health care staff about your symptoms: fever, cough and shortness of breath. . After you have been seen by a medical provider, you will be either: o Tested for (COVID-19) and discharged home on quarantine except to seek medical care if symptoms worsen, and asked to  - Stay home and avoid contact with others until you get your results (4-5 days)  - Avoid travel on public transportation if possible (such as bus, train, or airplane) or o Sent to the Emergency Department by EMS for evaluation, COVID-19 testing, and possible  admission depending on your condition and test results.  What to do if you are LOW RISK for COVID-19?  Reduce your risk of any infection by using the same precautions used for avoiding the common cold or flu:  . Wash your hands often with soap and warm water for at least 20 seconds.  If soap and water are not readily available, use an alcohol-based hand sanitizer with at least 60% alcohol.  . If coughing or sneezing, cover your mouth and nose by coughing or sneezing into the elbow areas of your shirt or coat, into a tissue or into your sleeve (not your hands). . Avoid shaking hands with others and consider head nods or verbal greetings only. . Avoid touching your eyes, nose, or mouth with unwashed hands.  . Avoid close contact with people who are sick. . Avoid places or events with large numbers of people in one location, like concerts or sporting events. . Carefully consider travel plans you have or are making. . If you are planning any travel outside or inside the [place], visit the CDC's Travelers' Health webpage for the latest health notices. . If you have some symptoms but not all symptoms, continue to monitor at home and seek medical attention if your symptoms worsen. . If you are having a medical emergency, call 911.   ADDITIONAL HEALTHCARE OPTIONS FOR PATIENTS  [facility] / e-Visit: [email or web address]         [facility]: [phone]  On[facility]   Urgent Care: [facility]: [place] Discharge Instructions for Patients Receiving Chemotherapy  Today you received the following chemotherapy agents:  Abraxane, Gemzar  To help prevent nausea and vomiting after your treatment, we encourage you to take your nausea medication as prescribed.   If you develop nausea and vomiting that is not controlled by your nausea medication, call the clinic.    BELOW ARE SYMPTOMS THAT SHOULD BE REPORTED IMMEDIATELY:  *FEVER GREATER THAN 100.5 F  *CHILLS WITH OR WITHOUT FEVER  NAUSEA AND VOMITING THAT IS NOT CONTROLLED WITH YOUR NAUSEA MEDICATION  *UNUSUAL SHORTNESS OF BREATH  *UNUSUAL BRUISING OR BLEEDING  TENDERNESS IN MOUTH AND THROAT WITH OR WITHOUT PRESENCE OF ULCERS  *URINARY PROBLEMS  *BOWEL PROBLEMS  UNUSUAL RASH Items with * indicate a potential emergency and should be followed up as soon as possible.  Feel free to call the clinic should you have any questions or concerns. The clinic phone number is [phone].  Please show the [facility] at check-in to the Emergency Department and triage nurse.

## 2018-12-13 LAB — CANCER ANTIGEN 19-9: CA 19-9: 39 U/mL — ABNORMAL HIGH (ref 0–35)

## 2018-12-14 ENCOUNTER — Other Ambulatory Visit: Payer: Self-pay | Admitting: Medical

## 2018-12-14 ENCOUNTER — Ambulatory Visit (HOSPITAL_COMMUNITY)
Admission: RE | Admit: 2018-12-14 | Discharge: 2018-12-14 | Disposition: A | Discharge status: Home / Self Care | Payer: 59 | Source: Ambulatory Visit | Attending: Medical | Admitting: Medical

## 2018-12-14 ENCOUNTER — Inpatient Hospital Stay (HOSPITAL_COMMUNITY)
Admission: EM | Admit: 2018-12-14 | Discharge: 2018-12-19 | DRG: 444 | Disposition: A | Discharge status: Home / Self Care | Payer: 59 | Attending: Internal Medicine | Admitting: Internal Medicine

## 2018-12-14 ENCOUNTER — Inpatient Hospital Stay (HOSPITAL_BASED_OUTPATIENT_CLINIC_OR_DEPARTMENT_OTHER): Payer: 59 | Admitting: Medical

## 2018-12-14 ENCOUNTER — Encounter (HOSPITAL_COMMUNITY): Payer: Self-pay | Admitting: Surgery

## 2018-12-14 ENCOUNTER — Other Ambulatory Visit: Payer: Self-pay

## 2018-12-14 ENCOUNTER — Inpatient Hospital Stay: Payer: 59

## 2018-12-14 ENCOUNTER — Telehealth: Payer: Self-pay

## 2018-12-14 VITALS — BP 122/75 | HR 108 | Temp 100.4°F | Resp 18

## 2018-12-14 DIAGNOSIS — R1011 Right upper quadrant pain: Secondary | ICD-10-CM

## 2018-12-14 DIAGNOSIS — Z79899 Other long term (current) drug therapy: Secondary | ICD-10-CM | POA: Diagnosis not present

## 2018-12-14 DIAGNOSIS — D539 Nutritional anemia, unspecified: Secondary | ICD-10-CM | POA: Diagnosis present

## 2018-12-14 DIAGNOSIS — T451X5A Adverse effect of antineoplastic and immunosuppressive drugs, initial encounter: Secondary | ICD-10-CM

## 2018-12-14 DIAGNOSIS — D62 Acute posthemorrhagic anemia: Secondary | ICD-10-CM | POA: Diagnosis not present

## 2018-12-14 DIAGNOSIS — D6959 Other secondary thrombocytopenia: Secondary | ICD-10-CM | POA: Diagnosis present

## 2018-12-14 DIAGNOSIS — E876 Hypokalemia: Secondary | ICD-10-CM | POA: Diagnosis not present

## 2018-12-14 DIAGNOSIS — H8101 Meniere's disease, right ear: Secondary | ICD-10-CM | POA: Diagnosis present

## 2018-12-14 DIAGNOSIS — C259 Malignant neoplasm of pancreas, unspecified: Secondary | ICD-10-CM

## 2018-12-14 DIAGNOSIS — Z681 Body mass index (BMI) 19 or less, adult: Secondary | ICD-10-CM | POA: Diagnosis not present

## 2018-12-14 DIAGNOSIS — K819 Cholecystitis, unspecified: Secondary | ICD-10-CM

## 2018-12-14 DIAGNOSIS — Z79891 Long term (current) use of opiate analgesic: Secondary | ICD-10-CM | POA: Diagnosis not present

## 2018-12-14 DIAGNOSIS — K8020 Calculus of gallbladder without cholecystitis without obstruction: Secondary | ICD-10-CM | POA: Diagnosis not present

## 2018-12-14 DIAGNOSIS — Z803 Family history of malignant neoplasm of breast: Secondary | ICD-10-CM | POA: Diagnosis not present

## 2018-12-14 DIAGNOSIS — Z5111 Encounter for antineoplastic chemotherapy: Secondary | ICD-10-CM

## 2018-12-14 DIAGNOSIS — C251 Malignant neoplasm of body of pancreas: Secondary | ICD-10-CM

## 2018-12-14 DIAGNOSIS — R509 Fever, unspecified: Secondary | ICD-10-CM

## 2018-12-14 DIAGNOSIS — N183 Chronic kidney disease, stage 3 unspecified: Secondary | ICD-10-CM | POA: Diagnosis present

## 2018-12-14 DIAGNOSIS — K8000 Calculus of gallbladder with acute cholecystitis without obstruction: Secondary | ICD-10-CM | POA: Diagnosis not present

## 2018-12-14 DIAGNOSIS — K810 Acute cholecystitis: Secondary | ICD-10-CM | POA: Diagnosis not present

## 2018-12-14 DIAGNOSIS — K8043 Calculus of bile duct with acute cholecystitis with obstruction: Secondary | ICD-10-CM | POA: Diagnosis not present

## 2018-12-14 DIAGNOSIS — I82442 Acute embolism and thrombosis of left tibial vein: Secondary | ICD-10-CM | POA: Diagnosis not present

## 2018-12-14 DIAGNOSIS — R609 Edema, unspecified: Secondary | ICD-10-CM | POA: Diagnosis not present

## 2018-12-14 DIAGNOSIS — C787 Secondary malignant neoplasm of liver and intrahepatic bile duct: Secondary | ICD-10-CM

## 2018-12-14 DIAGNOSIS — D6481 Anemia due to antineoplastic chemotherapy: Secondary | ICD-10-CM | POA: Diagnosis not present

## 2018-12-14 DIAGNOSIS — K0889 Other specified disorders of teeth and supporting structures: Secondary | ICD-10-CM | POA: Diagnosis present

## 2018-12-14 DIAGNOSIS — Z95828 Presence of other vascular implants and grafts: Secondary | ICD-10-CM

## 2018-12-14 DIAGNOSIS — Z807 Family history of other malignant neoplasms of lymphoid, hematopoietic and related tissues: Secondary | ICD-10-CM | POA: Diagnosis not present

## 2018-12-14 DIAGNOSIS — D630 Anemia in neoplastic disease: Secondary | ICD-10-CM | POA: Diagnosis present

## 2018-12-14 DIAGNOSIS — J9811 Atelectasis: Secondary | ICD-10-CM | POA: Diagnosis not present

## 2018-12-14 DIAGNOSIS — D1803 Hemangioma of intra-abdominal structures: Secondary | ICD-10-CM

## 2018-12-14 DIAGNOSIS — Z8507 Personal history of malignant neoplasm of pancreas: Secondary | ICD-10-CM | POA: Diagnosis not present

## 2018-12-14 DIAGNOSIS — K4090 Unilateral inguinal hernia, without obstruction or gangrene, not specified as recurrent: Secondary | ICD-10-CM | POA: Diagnosis present

## 2018-12-14 DIAGNOSIS — R42 Dizziness and giddiness: Secondary | ICD-10-CM

## 2018-12-14 DIAGNOSIS — E440 Moderate protein-calorie malnutrition: Secondary | ICD-10-CM | POA: Diagnosis not present

## 2018-12-14 DIAGNOSIS — Z808 Family history of malignant neoplasm of other organs or systems: Secondary | ICD-10-CM | POA: Diagnosis not present

## 2018-12-14 DIAGNOSIS — R600 Localized edema: Secondary | ICD-10-CM | POA: Diagnosis present

## 2018-12-14 DIAGNOSIS — K8050 Calculus of bile duct without cholangitis or cholecystitis without obstruction: Secondary | ICD-10-CM

## 2018-12-14 DIAGNOSIS — R109 Unspecified abdominal pain: Secondary | ICD-10-CM | POA: Diagnosis not present

## 2018-12-14 DIAGNOSIS — R946 Abnormal results of thyroid function studies: Secondary | ICD-10-CM | POA: Diagnosis not present

## 2018-12-14 DIAGNOSIS — Z800 Family history of malignant neoplasm of digestive organs: Secondary | ICD-10-CM | POA: Diagnosis not present

## 2018-12-14 DIAGNOSIS — C799 Secondary malignant neoplasm of unspecified site: Secondary | ICD-10-CM | POA: Diagnosis not present

## 2018-12-14 DIAGNOSIS — D61810 Antineoplastic chemotherapy induced pancytopenia: Secondary | ICD-10-CM | POA: Diagnosis not present

## 2018-12-14 DIAGNOSIS — D696 Thrombocytopenia, unspecified: Secondary | ICD-10-CM | POA: Diagnosis present

## 2018-12-14 HISTORY — DX: Noninfective gastroenteritis and colitis, unspecified: K52.9

## 2018-12-14 HISTORY — DX: Diarrhea, unspecified: R19.7

## 2018-12-14 LAB — URINALYSIS, COMPLETE (UACMP) WITH MICROSCOPIC
Bacteria, UA: NONE SEEN
Bilirubin Urine: NEGATIVE
Glucose, UA: NEGATIVE mg/dL
Ketones, ur: NEGATIVE mg/dL
Leukocytes,Ua: NEGATIVE
Nitrite: NEGATIVE
Protein, ur: 30 mg/dL — AB
Specific Gravity, Urine: 1.024 (ref 1.005–1.030)
pH: 5 (ref 5.0–8.0)

## 2018-12-14 LAB — COMPREHENSIVE METABOLIC PANEL
ALT: 57 U/L — ABNORMAL HIGH (ref 0–44)
AST: 55 U/L — ABNORMAL HIGH (ref 15–41)
Albumin: 3.4 g/dL — ABNORMAL LOW (ref 3.5–5.0)
Alkaline Phosphatase: 354 U/L — ABNORMAL HIGH (ref 38–126)
Anion gap: 9 (ref 5–15)
BUN: 23 mg/dL — ABNORMAL HIGH (ref 6–20)
CO2: 27 mmol/L (ref 22–32)
Calcium: 9 mg/dL (ref 8.9–10.3)
Chloride: 99 mmol/L (ref 98–111)
Creatinine, Ser: 1.14 mg/dL — ABNORMAL HIGH (ref 0.44–1.00)
GFR calc Af Amer: >60 mL/min (ref >60)
GFR calc non Af Amer: 54 mL/min — ABNORMAL LOW (ref >60)
Glucose, Bld: 147 mg/dL — ABNORMAL HIGH (ref 70–99)
Potassium: 3.5 mmol/L (ref 3.5–5.1)
Sodium: 135 mmol/L (ref 135–145)
Total Bilirubin: 1.5 mg/dL — ABNORMAL HIGH (ref 0.3–1.2)
Total Protein: 7.7 g/dL (ref 6.5–8.1)

## 2018-12-14 LAB — CBC WITH DIFFERENTIAL/PLATELET
Abs Immature Granulocytes: 0.05 10*3/uL (ref 0.00–0.07)
Basophils Absolute: 0 10*3/uL (ref 0.0–0.1)
Basophils Relative: 0 %
Eosinophils Absolute: 0 10*3/uL (ref 0.0–0.5)
Eosinophils Relative: 0 %
HCT: 26.3 % — ABNORMAL LOW (ref 36.0–46.0)
Hemoglobin: 8.9 g/dL — ABNORMAL LOW (ref 12.0–15.0)
Immature Granulocytes: 1 %
Lymphocytes Relative: 2 %
Lymphs Abs: 0.2 10*3/uL — ABNORMAL LOW (ref 0.7–4.0)
MCH: 34.8 pg — ABNORMAL HIGH (ref 26.0–34.0)
MCHC: 33.8 g/dL (ref 30.0–36.0)
MCV: 102.7 fL — ABNORMAL HIGH (ref 80.0–100.0)
Monocytes Absolute: 0.1 10*3/uL (ref 0.1–1.0)
Monocytes Relative: 1 %
Neutro Abs: 7.1 10*3/uL (ref 1.7–7.7)
Neutrophils Relative %: 96 %
Platelets: 132 10*3/uL — ABNORMAL LOW (ref 150–400)
RBC: 2.56 MIL/uL — ABNORMAL LOW (ref 3.87–5.11)
RDW: 15.1 % (ref 11.5–15.5)
WBC: 7.4 10*3/uL (ref 4.0–10.5)
nRBC: 0 % (ref 0.0–0.2)

## 2018-12-14 LAB — AMYLASE: Amylase: 53 U/L (ref 28–100)

## 2018-12-14 LAB — LIPASE, BLOOD: Lipase: 65 U/L — ABNORMAL HIGH (ref 11–51)

## 2018-12-14 MED ORDER — HYDROCORTISONE 1 % EX CREA: 1.0000 "application " | TOPICAL_CREAM | Freq: Three times a day (TID) | CUTANEOUS | Status: DC | PRN

## 2018-12-14 MED ORDER — GUAIFENESIN-DM 100-10 MG/5ML PO SYRP: 10.0000 mL | ORAL_SOLUTION | ORAL | Status: DC | PRN

## 2018-12-14 MED ORDER — PROCHLORPERAZINE EDISYLATE 10 MG/2ML IJ SOLN: 5.0000 mg | INTRAMUSCULAR | Status: DC | PRN

## 2018-12-14 MED ORDER — PIPERACILLIN-TAZOBACTAM 3.375 G IVPB
3.3750 g | Freq: Three times a day (TID) | INTRAVENOUS | Status: DC
  Administered 2018-12-15 – 2018-12-17 (×7): 3.375 g via INTRAVENOUS
  Filled 2018-12-14 (×8): qty 50

## 2018-12-14 MED ORDER — LORATADINE 10 MG PO TABS: 10.0000 mg | ORAL_TABLET | Freq: Every day | ORAL | Status: DC | PRN

## 2018-12-14 MED ORDER — ACETAMINOPHEN 325 MG PO TABS: 325.0000 mg | ORAL_TABLET | Freq: Four times a day (QID) | ORAL | Status: DC | PRN

## 2018-12-14 MED ORDER — SODIUM CHLORIDE 0.9 % IV SOLN
8.0000 mg | Freq: Four times a day (QID) | INTRAVENOUS | Status: DC | PRN
  Filled 2018-12-14: qty 4

## 2018-12-14 MED ORDER — GABAPENTIN 100 MG PO CAPS
100.0000 mg | ORAL_CAPSULE | Freq: Two times a day (BID) | ORAL | Status: DC
  Administered 2018-12-14 – 2018-12-19 (×9): 100 mg via ORAL
  Filled 2018-12-14 (×9): qty 1

## 2018-12-14 MED ORDER — POTASSIUM CHLORIDE 10 MEQ/100ML IV SOLN
10.0000 meq | INTRAVENOUS | Status: AC
  Administered 2018-12-14 (×2): 10 meq via INTRAVENOUS
  Filled 2018-12-14 (×2): qty 100

## 2018-12-14 MED ORDER — LIP MEDEX EX OINT
1.0000 "application " | TOPICAL_OINTMENT | Freq: Two times a day (BID) | CUTANEOUS | Status: DC
  Administered 2018-12-16 – 2018-12-17 (×2): 1 via TOPICAL
  Filled 2018-12-14 (×2): qty 7

## 2018-12-14 MED ORDER — ALUM & MAG HYDROXIDE-SIMETH 200-200-20 MG/5ML PO SUSP: 30.0000 mL | Freq: Four times a day (QID) | ORAL | Status: DC | PRN

## 2018-12-14 MED ORDER — HEPARIN SOD (PORK) LOCK FLUSH 100 UNIT/ML IV SOLN
500.0000 [IU] | Freq: Once | INTRAVENOUS | Status: DC
  Filled 2018-12-14: qty 5

## 2018-12-14 MED ORDER — MENTHOL 3 MG MT LOZG: 1.0000 | LOZENGE | OROMUCOSAL | Status: DC | PRN

## 2018-12-14 MED ORDER — HEPARIN SOD (PORK) LOCK FLUSH 100 UNIT/ML IV SOLN: 500.0000 [IU] | Freq: Once | INTRAVENOUS | Status: DC

## 2018-12-14 MED ORDER — IOHEXOL 300 MG/ML  SOLN
100.0000 mL | Freq: Once | INTRAMUSCULAR | Status: AC | PRN
  Administered 2018-12-14: 80 mL via INTRAVENOUS

## 2018-12-14 MED ORDER — LACTATED RINGERS IV BOLUS: 1000.0000 mL | Freq: Three times a day (TID) | INTRAVENOUS | Status: AC | PRN

## 2018-12-14 MED ORDER — MECLIZINE HCL 25 MG PO TABS
25.0000 mg | ORAL_TABLET | Freq: Three times a day (TID) | ORAL | Status: DC | PRN
  Administered 2018-12-14: 25 mg via ORAL
  Filled 2018-12-14: qty 1

## 2018-12-14 MED ORDER — TRAMADOL HCL 50 MG PO TABS
50.0000 mg | ORAL_TABLET | Freq: Four times a day (QID) | ORAL | Status: DC | PRN
  Administered 2018-12-14 – 2018-12-18 (×4): 50 mg via ORAL
  Filled 2018-12-14 (×4): qty 1

## 2018-12-14 MED ORDER — SODIUM CHLORIDE 0.9 % IV SOLN
INTRAVENOUS | Status: DC | PRN
  Administered 2018-12-14: 20:00:00 500 mL via INTRAVENOUS
  Administered 2018-12-15: 250 mL via INTRAVENOUS

## 2018-12-14 MED ORDER — MAGIC MOUTHWASH
15.0000 mL | Freq: Four times a day (QID) | ORAL | Status: DC | PRN
  Filled 2018-12-14: qty 15

## 2018-12-14 MED ORDER — PIPERACILLIN-TAZOBACTAM 3.375 G IVPB 30 MIN
3.3750 g | Freq: Once | INTRAVENOUS | Status: AC
  Administered 2018-12-14: 3.375 g via INTRAVENOUS
  Filled 2018-12-14: qty 50

## 2018-12-14 MED ORDER — DIPHENHYDRAMINE HCL 50 MG/ML IJ SOLN: 12.5000 mg | Freq: Four times a day (QID) | INTRAMUSCULAR | Status: DC | PRN

## 2018-12-14 MED ORDER — SODIUM CHLORIDE (PF) 0.9 % IJ SOLN
INTRAMUSCULAR | Status: AC
  Filled 2018-12-14: qty 50

## 2018-12-14 MED ORDER — HYDROCORTISONE (PERIANAL) 2.5 % EX CREA: 1.0000 "application " | TOPICAL_CREAM | Freq: Four times a day (QID) | CUTANEOUS | Status: DC | PRN

## 2018-12-14 MED ORDER — ONDANSETRON HCL 4 MG/2ML IJ SOLN
4.0000 mg | Freq: Four times a day (QID) | INTRAMUSCULAR | Status: DC | PRN
  Administered 2018-12-14: 4 mg via INTRAVENOUS
  Filled 2018-12-14: qty 2

## 2018-12-14 MED ORDER — SODIUM CHLORIDE 0.9% FLUSH
10.0000 mL | Freq: Once | INTRAVENOUS | Status: AC
  Filled 2018-12-14: qty 10

## 2018-12-14 MED ORDER — ACETAMINOPHEN 650 MG RE SUPP: 650.0000 mg | Freq: Four times a day (QID) | RECTAL | Status: DC | PRN

## 2018-12-14 MED ORDER — HYDROMORPHONE HCL 1 MG/ML IJ SOLN
0.5000 mg | INTRAMUSCULAR | Status: DC | PRN
  Administered 2018-12-15 – 2018-12-16 (×2): 1 mg via INTRAVENOUS
  Filled 2018-12-14 (×2): qty 1

## 2018-12-14 MED ORDER — PHENOL 1.4 % MT LIQD: 1.0000 | OROMUCOSAL | Status: DC | PRN

## 2018-12-14 MED ORDER — METHOCARBAMOL 1000 MG/10ML IJ SOLN
1000.0000 mg | Freq: Four times a day (QID) | INTRAVENOUS | Status: DC | PRN
  Filled 2018-12-14: qty 10

## 2018-12-14 NOTE — Telephone Encounter (Signed)
Patient calls with complaint of abdominal pain, painful with any kind of movement, received treatment on Monday ran fever that night, gone Tuesday morning, checked temperature now 100.1, spoke with Cira Rue NP who would like patient to come in to be evaluated, notified patient to be here at 1:00.  Scheduled. Lab at 1:00, see Cira Rue NP at 1:30.

## 2018-12-14 NOTE — H&P (Signed)
Morgan Dawson NIO:[id number] DOB: 1960-11-21 DOA: 12/14/2018     PCP: [name], MD   Outpatient Specialists:    Oncology  Dr.Feng    Patient arrived to ER on 12/14/18 at 1728  Patient coming from: home Lives With family    Chief Complaint:  Chief Complaint  Patient presents with   Abdominal Pain    HPI: Morgan Dawson is a 58 y.o. 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 No nausea, no diarrhea, She lost her appetite. No sick contacts Her last chemotherapy treatment was on Monday on 12/12/2018 and at that time.  Fevers up to 102.6 Tylenol not seem to have helped.  This morning she had a low-grade temperature of 100.1 continue to have right upper quadrant pain She presented to oncology office today at that time they obtain urine and blood cultures (urine analysis which was unremarkable) and chemistry as well as CBC chest x-ray was unremarkable her LFTs noted to be elevated at which point she undergone CT of her abdomen and pelvis and chest showing cholecystitis/choledocholithiasis She was instructed to go to emergency department  Infectious risk factors:  Reports  fever, abdominal pain but was found to have cholecystitis     Regarding pertinent Chronic problems:    History of Metastatic pancreatic cancer currently undergoing chemotherapy While in ER: Initially noted to be tachycardic with persistent right upper quadrant abdominal tenderness General surgery has been consulted but given complexity with patient recommended admission to try at hospitalist She was started on Zosyn Pain treated with Dilaudid The following Work up has been ordered so far:  Orders Placed This Encounter  Procedures   Comprehensive metabolic panel   CBC   Diet clear liquid  Room service appropriate? Yes; Fluid consistency: Thin   Diet NPO time specified Except for: Sips with Meds, Ice Chips   Apply ice to affected area   K pad   Use shower chair   Shower with wound open   Reinforce dressing   Up in chair   Ambulate in hall   Converse order/instruction: Pharmacy may adjust medication dosing, strength, schedule, rate of infusion, etc as needed to optimize therapy   Consult to general surgery   Consult to hospitalist   piperacillin-tazobactam (ZOSYN) per pharmacy consult     Following Medications were ordered in ER: Medications  piperacillin-tazobactam (ZOSYN) IVPB 3.375 g (has no administration in time range)  0.9 %  sodium chloride infusion (has no administration in time range)  lactated ringers bolus 1,000 mL (has no administration in time range)  ondansetron (ZOFRAN) injection 4 mg (has no administration in time range)    Or  ondansetron (ZOFRAN) 8 mg in sodium chloride 0.9 % 50 mL IVPB (has no administration in time range)  prochlorperazine (COMPAZINE) injection 5-10 mg (has no administration in time range)  lip balm (CARMEX) ointment 1 application (has no administration in time range)  magic mouthwash (has no administration in time range)  guaiFENesin-dextromethorphan (ROBITUSSIN DM) 100-10 MG/5ML syrup 10 mL (has no administration in time range)  hydrocortisone (ANUSOL-HC) 2.5 % rectal cream 1 application (has no administration in time range)  alum & mag hydroxide-simeth (MAALOX/MYLANTA) 200-200-20 MG/5ML suspension 30 mL (has no administration in time range)  hydrocortisone cream 1 %  1 application (has no administration in time range)  menthol-cetylpyridinium (CEPACOL) lozenge 3 mg (has no administration in time range)  phenol (CHLORASEPTIC) mouth spray 1-2 spray (has no administration in time range)  diphenhydrAMINE (BENADRYL) injection 12.5-25 mg (has no administration in time range)  HYDROmorphone (DILAUDID) injection  0.5-2 mg (has no administration in time range)  acetaminophen (TYLENOL) tablet 325-650 mg (has no administration in time range)  acetaminophen (TYLENOL) suppository 650 mg (has no administration in time range)  methocarbamol (ROBAXIN) 1,000 mg in dextrose 5 % 50 mL IVPB (has no administration in time range)  gabapentin (NEURONTIN) capsule 100 mg (has no administration in time range)  loratadine (CLARITIN) tablet 10 mg (has no administration in time range)  meclizine (ANTIVERT) tablet 25 mg (has no administration in time range)  traMADol (ULTRAM) tablet 50 mg (has no administration in time range)  piperacillin-tazobactam (ZOSYN) IVPB 3.375 g (has no administration in time range)        Consult Orders  (From admission, onward)         Start     Ordered   12/14/18 1846  Consult to hospitalist  Once    Provider:  Toy Baker, MD  Question Answer Comment  Place call to: [facility]   Reason for Consult Admit      12/14/18 1845          ER Provider Called: general surgery    Dr.Gross  They Recommend admit to medicine  Will see in AM    Significant initial  Findings: Abnormal Labs Reviewed - No abnormal labs to display    Otherwise labs showing:    Recent Labs  Lab 12/12/18 1056 12/14/18 1317  NA 138 135  K 3.8 3.5  CO2 27 27  GLUCOSE 110* 147*  BUN 21* 23*  CREATININE 1.04* 1.14*  CALCIUM 9.3 9.0    Cr   stable,   Lab Results  Component Value Date   CREATININE 1.14 (H) 12/14/2018   CREATININE 1.04 (H) 12/12/2018   CREATININE 1.08 (H) 12/05/2018    Recent Labs  Lab 12/12/18 1056 12/14/18 1317  AST 48* 55*  ALT 53* 57*  ALKPHOS 317* 354*  BILITOT 0.5 1.5*  PROT 7.7 7.7  ALBUMIN 3.4* 3.4*        WBC      Component Value Date/Time   WBC 7.4 12/14/2018 1317      HG/HCT Up from baseline see below    Component Value Date/Time   HGB 8.9 (L) 12/14/2018 1317   HGB 6.6 (LL) 01/12/2018 1130   HGB 11.9 08/23/2017 0951   HCT 26.3  (L) 12/14/2018 1317   HCT 36.0 08/23/2017 0951    Recent Labs  Lab 12/14/18 1332  LIPASE 65*  AMYLASE 53        UA   no evidence of UTI    Urine analysis:    Component Value Date/Time   COLORURINE AMBER (A) 12/14/2018 1438   APPEARANCEUR HAZY (A) 12/14/2018 1438   LABSPEC 1.024 12/14/2018 1438   PHURINE 5.0 12/14/2018 1438   GLUCOSEU NEGATIVE 12/14/2018 1438   HGBUR SMALL (A) 12/14/2018 1438   BILIRUBINUR NEGATIVE 12/14/2018 1438   [place] 12/14/2018 1438   PROTEINUR 30 (A) 12/14/2018 1438   NITRITE NEGATIVE 12/14/2018 1438   LEUKOCYTESUR NEGATIVE 12/14/2018 1438       CXR -  NON acute  CTabd/pelvis - acute cholecystitis, with choledocholithiasis of the cystic duct  CTA chest -  nonacute,   ECG:  Not obtained   ED Triage Vitals [12/14/18 1731]  Enc Vitals Group     BP 131/83     Pulse Rate 100     Resp (!) 25     Temp (!) 100.6 F (38.1 C)     Temp Source Oral     SpO2 100 %     Weight      Height      Head Circumference      Peak Flow      Pain Score      Pain Loc      Pain Edu?      Excl. in [place]?   DZHG(99)@       Latest  Blood pressure 112/70, pulse (!) 104, temperature (!) 100.6 F (38.1 C), temperature source Oral, resp. rate 20, last menstrual period 10/23/2015, SpO2 97 %.     Hospitalist was called for admission for cholecystitis   Review of Systems:    Pertinent positives include: Fevers, chills,abdominal pain, nausea,  Constitutional:  No weight loss, night sweats, fatigue, weight loss  HEENT:  No headaches, Difficulty swallowing,Tooth/dental problems,Sore throat,  No sneezing, itching, ear ache, nasal congestion, post nasal drip,  Cardio-vascular:  No chest pain, Orthopnea, PND, anasarca, dizziness, palpitations.no Bilateral lower extremity swelling  GI:  No heartburn, indigestion,  vomiting, diarrhea, change in bowel habits, loss of appetite, melena, blood in stool, hematemesis Resp:  no shortness of breath at rest.  No dyspnea on exertion, No excess mucus, no productive cough, No non-productive cough, No coughing up of blood.No change in color of mucus.No wheezing. Skin:  no rash or lesions. No jaundice GU:  no dysuria, change in color of urine, no urgency or frequency. No straining to urinate.  No flank pain.  Musculoskeletal:  No joint pain or no joint swelling. No decreased range of motion. No back pain.  Psych:  No change in mood or affect. No depression or anxiety. No memory loss.  Neuro: no localizing neurological complaints, no tingling, no weakness, no double vision, no gait abnormality, no slurred speech, no confusion  All systems reviewed and apart from [place] all are negative  Past Medical History:   Past Medical History:  Diagnosis Date   Gastroenteritis 01/12/2018   Genetic testing 12/02/2017   Multi-Cancer panel (83 genes) @ [facility] - No pathogenic mutations detected   Meniere disease 2016   Pancreatitis       Past Surgical History:  Procedure Laterality Date   CERVICAL ABLATION     ENDOMETRIAL ABLATION  2011   IR FLUORO GUIDE PORT INSERTION RIGHT  08/12/2017   IR US GUIDE VASC ACCESS RIGHT  08/12/2017   MANDIBLE SURGERY  1999    Social History:  Ambulatory  independently       reports that she has never smoked. She has never used smokeless tobacco. She reports that she does not drink alcohol or use drugs.     Family History:   Family History  Problem Relation Age of Onset   Colon cancer Father 4       currently 69   Colon cancer Maternal Grandfather    Breast cancer Cousin    Thyroid cancer Mother 50       facial radiation for acne as teen   Lymphoma Paternal Aunt 87       deceased 103   Breast cancer Paternal Aunt     Allergies: No Known Allergies   Prior to Admission medications   Medication Sig Start Date  End Date Taking? Authorizing Provider  gabapentin (NEURONTIN) 100 MG capsule Take 1 capsule (100 mg total) by mouth 3 (three) times  daily. 06/03/18   [name], MD  loperamide (IMODIUM) 2 MG capsule Take 1 capsule (2 mg total) by mouth as needed for diarrhea or loose stools. 01/15/18   Lavina Hamman, MD  loratadine (CLARITIN) 10 MG tablet Take 10 mg by mouth daily as needed for allergies.    [provider]  magic mouthwash w/lidocaine SOLN Take 10 mLs by mouth 3 (three) times daily as needed for mouth pain. Swish and spit 10 ML by mouth 3 times daily as needed for mouth pain. 08/31/17   Alla Feeling, NP  meclizine (ANTIVERT) 25 MG tablet Take 25 mg by mouth 3 (three) times daily as needed for dizziness.    [provider]  ondansetron (ZOFRAN-ODT) 8 MG disintegrating tablet TAKE 1 TABLET BY MOUTH EVERY 8 HOURS AS NEEDED FOR NAUSE OR VOMITING 01/25/18   Alla Feeling, NP  potassium chloride (K-DUR) 10 MEQ tablet Take 1 tablet (10 mEq total) by mouth 4 (four) times daily. 06/08/18   [name], MD  prochlorperazine (COMPAZINE) 10 MG tablet Take 1 tablet (10 mg total) by mouth every 6 (six) hours as needed for nausea or vomiting. 12/06/17   Alla Feeling, NP  traMADol (ULTRAM) 50 MG tablet Take 1 tablet (50 mg total) by mouth every 6 (six) hours as needed. 08/22/18   [name], MD   Physical Exam: Blood pressure 112/70, pulse (!) 104, temperature (!) 100.6 F (38.1 C), temperature source Oral, resp. rate 20, last menstrual period 10/23/2015, SpO2 97 %. 1. General:  in No Acute distress    Chronically ill  -appearing 2. Psychological: Alert and   Oriented 3. Head/ENT:     Dry Mucous Membranes                          Head Non traumatic, neck supple                          Poor Dentition 4. SKIN: normal   decreased Skin turgor,  Skin clean Dry and intact no rash 5. Heart: Regular rate and rhythm no Murmur, no Rub or gallop 6. Lungs:  Clear to auscultation bilaterally, no wheezes or crackles   7. Abdomen: Soft, RUQ pain no  rebound  tenderness, Non distended   8. Lower extremities: no clubbing, cyanosis,     Edema LEft >right 9. Neurologically Grossly intact, moving all 4 extremities equally  10. MSK: Normal range of motion   All other LABS:     Recent Labs  Lab 12/12/18 1056 12/14/18 1317  WBC 2.0* 7.4  NEUTROABS 1.1* 7.1  HGB 8.8* 8.9*  HCT 26.8* 26.3*  MCV 106.8* 102.7*  PLT 120* 132*     Recent Labs  Lab 12/12/18 1056 12/14/18 1317  NA 138 135  K 3.8 3.5  CL 103 99  CO2 27 27  GLUCOSE 110* 147*  BUN 21* 23*  CREATININE 1.04* 1.14*  CALCIUM 9.3 9.0     Recent Labs  Lab 12/12/18 1056 12/14/18 1317  AST 48* 55*  ALT 53* 57*  ALKPHOS 317* 354*  BILITOT 0.5 1.5*  PROT 7.7 7.7  ALBUMIN 3.4* 3.4*       Cultures:    Component Value Date/Time   SDES BLOOD PORTA CATH 12/14/2018 1350  SPECREQUEST  12/14/2018 1350    BOTTLES DRAWN AEROBIC AND ANAEROBIC Blood Culture adequate volume Performed at [facility] [address] [place],  [address]    CULT PENDING 12/14/2018 1350   REPTSTATUS PENDING 12/14/2018 1350     Radiological Exams on Admission: Dg Chest 2 View  Result Date: 12/14/2018 CLINICAL DATA:  Abdominal pain EXAM: CHEST - 2 VIEW COMPARISON:  09/01/2018 PET FINDINGS: Right jugular Port-A-Cath with its tip at the cavoatrial junction is stable. Normal heart size. Clear lungs. No pneumothorax or pleural effusion. IMPRESSION: No active cardiopulmonary disease. Electronically Signed   By: Marybelle Killings M.D.   On: 12/14/2018 16:08   Ct Chest W Contrast  Addendum Date: 12/14/2018   ADDENDUM REPORT: 12/14/2018 19:17 ADDENDUM: Addendum created for clarification of findings related to the gallbladder. Distended gallbladder with inflammatory changes compatible with acute cholecystitis. The obstructive calcified stone is likely in the infundibulum/proximal cystic duct, not within common bile duct. No calcified stones are visualized within the common bile duct. Findings discussed with Dr. Johney Maine of general surgery. Electronically Signed   By: Corrie Mckusick D.O.   On: 12/14/2018 19:17   Result Date: 12/14/2018 CLINICAL DATA:  58 year old female with a history of metastatic pancreatic cancer with right upper quadrant pain and fever EXAM: CT CHEST, ABDOMEN, AND PELVIS WITH CONTRAST TECHNIQUE: Multidetector CT imaging of the chest, abdomen and pelvis was performed following the standard protocol during bolus administration of intravenous contrast. CONTRAST:  176mL OMNIPAQUE IOHEXOL 300 MG/ML  SOLN COMPARISON:  Multiple prior PET CT most recent 09/01/2018, most recent CT abdomen 10/14/2017 FINDINGS: CT CHEST FINDINGS Cardiovascular: Heart size within normal limits. No pericardial fluid/thickening. Unremarkable course caliber contour of the thoracic aorta. Unremarkable diameter of the main pulmonary artery. No proximal filling defects. No significant atherosclerosis. Port catheter on the right chest wall with IJ approach. Mediastinum/Nodes: No adenopathy. Unremarkable appearance of the thoracic esophagus. Unremarkable thoracic inlet. Lungs/Pleura: No pneumothorax or pleural effusion. No confluent airspace disease. Atelectatic changes/scarring bilateral lungs, predominantly in the dependent lung bases, lingula, right middle lobe. Musculoskeletal: No acute displaced fracture. No significant degenerative changes. No bony canal narrowing. CT ABDOMEN PELVIS FINDINGS Hepatobiliary: There are treatment changes within the liver, with multiple regions of necrotic/cystic change, new from the baseline CT of 07/23/2017. Compared to the most recent PET/CT they are has been significant interval decrease in size of all of the lesions previously noted, involving all liver segments. Index lesion in segment 4 B measures 5.8 cm with internal necrosis. Index lesion within segment 5/6 measuring 5.7 cm, with internal necrosis. Index lesion segment 8 measures 3.2 cm with internal necrosis. Hypervascular region on the margin of a treated lesion within segment 5 (image 122 of series 3)  likely perfusion anomaly/Thad. Distention of the gallbladder with gallbladder wall thickening and multiple intrahepatic stones. Calcified stone in the hilum of the liver, likely within the cystic duct. No calcified stone is identified within the distal common bile duct. Pancreas: Unchanged appearance of the pancreas with mild dilation of the pancreatic duct in the body of the pancreas. No inflammatory changes or calcified parenchyma. Spleen: Unremarkable spleen Adrenals/Urinary Tract: Unremarkable appearance of the adrenal glands. No evidence of hydronephrosis of the right or left kidney. No nephrolithiasis. Unremarkable course of the bilateral ureters. Unremarkable appearance of the urinary bladder. Stomach/Bowel: Unremarkable stomach. Small bowel unremarkable without abnormal distention or focal inflammatory changes. Enteric contrast reaches the terminal ileum. Moderate stool burden. No evidence of obstruction. Vascular/Lymphatic: No significant  atherosclerotic changes. Edema within the fat at the liver hilum, surrounding the gallbladder within the right abdomen, with mild edema within the mesenteric fat of the mid abdomen. No adenopathy. Reproductive: Unremarkable uterus. Other: None Musculoskeletal: No acute displaced fracture. IMPRESSION: CT demonstrates acute cholecystitis, with choledocholithiasis of the cystic duct. These results were discussed by telephone at the time of interpretation on 12/14/2018 at 5:05 pm with Dr. Burr Medico. Redemonstration of known liver metastases, with positive response to therapy, interval reduction in size of all lesions, with multiple demonstrating significant internal necrosis. No acute finding of the chest. Ancillary findings as above. Electronically Signed: By: Corrie Mckusick D.O. On: 12/14/2018 17:12   Ct Abdomen Pelvis W Contrast  Addendum Date: 12/14/2018   ADDENDUM REPORT: 12/14/2018 19:17 ADDENDUM: Addendum created for clarification of findings related to the gallbladder.  Distended gallbladder with inflammatory changes compatible with acute cholecystitis. The obstructive calcified stone is likely in the infundibulum/proximal cystic duct, not within common bile duct. No calcified stones are visualized within the common bile duct. Findings discussed with Dr. Johney Maine of general surgery. Electronically Signed   By: Corrie Mckusick D.O.   On: 12/14/2018 19:17   Result Date: 12/14/2018 CLINICAL DATA:  58 year old female with a history of metastatic pancreatic cancer with right upper quadrant pain and fever EXAM: CT CHEST, ABDOMEN, AND PELVIS WITH CONTRAST TECHNIQUE: Multidetector CT imaging of the chest, abdomen and pelvis was performed following the standard protocol during bolus administration of intravenous contrast. CONTRAST:  138mL OMNIPAQUE IOHEXOL 300 MG/ML  SOLN COMPARISON:  Multiple prior PET CT most recent 09/01/2018, most recent CT abdomen 10/14/2017 FINDINGS: CT CHEST FINDINGS Cardiovascular: Heart size within normal limits. No pericardial fluid/thickening. Unremarkable course caliber contour of the thoracic aorta. Unremarkable diameter of the main pulmonary artery. No proximal filling defects. No significant atherosclerosis. Port catheter on the right chest wall with IJ approach. Mediastinum/Nodes: No adenopathy. Unremarkable appearance of the thoracic esophagus. Unremarkable thoracic inlet. Lungs/Pleura: No pneumothorax or pleural effusion. No confluent airspace disease. Atelectatic changes/scarring bilateral lungs, predominantly in the dependent lung bases, lingula, right middle lobe. Musculoskeletal: No acute displaced fracture. No significant degenerative changes. No bony canal narrowing. CT ABDOMEN PELVIS FINDINGS Hepatobiliary: There are treatment changes within the liver, with multiple regions of necrotic/cystic change, new from the baseline CT of 07/23/2017. Compared to the most recent PET/CT they are has been significant interval decrease in size of all of the lesions  previously noted, involving all liver segments. Index lesion in segment 4 B measures 5.8 cm with internal necrosis. Index lesion within segment 5/6 measuring 5.7 cm, with internal necrosis. Index lesion segment 8 measures 3.2 cm with internal necrosis. Hypervascular region on the margin of a treated lesion within segment 5 (image 122 of series 3) likely perfusion anomaly/Thad. Distention of the gallbladder with gallbladder wall thickening and multiple intrahepatic stones. Calcified stone in the hilum of the liver, likely within the cystic duct. No calcified stone is identified within the distal common bile duct. Pancreas: Unchanged appearance of the pancreas with mild dilation of the pancreatic duct in the body of the pancreas. No inflammatory changes or calcified parenchyma. Spleen: Unremarkable spleen Adrenals/Urinary Tract: Unremarkable appearance of the adrenal glands. No evidence of hydronephrosis of the right or left kidney. No nephrolithiasis. Unremarkable course of the bilateral ureters. Unremarkable appearance of the urinary bladder. Stomach/Bowel: Unremarkable stomach. Small bowel unremarkable without abnormal distention or focal inflammatory changes. Enteric contrast reaches the terminal ileum. Moderate stool burden. No evidence of obstruction. Vascular/Lymphatic: No significant  atherosclerotic changes. Edema within the fat at the liver hilum, surrounding the gallbladder within the right abdomen, with mild edema within the mesenteric fat of the mid abdomen. No adenopathy. Reproductive: Unremarkable uterus. Other: None Musculoskeletal: No acute displaced fracture. IMPRESSION: CT demonstrates acute cholecystitis, with choledocholithiasis of the cystic duct. These results were discussed by telephone at the time of interpretation on 12/14/2018 at 5:05 pm with Dr. Burr Medico. Redemonstration of known liver metastases, with positive response to therapy, interval reduction in size of all lesions, with multiple  demonstrating significant internal necrosis. No acute finding of the chest. Ancillary findings as above. Electronically Signed: By: Corrie Mckusick D.O. On: 12/14/2018 17:12    Chart has been reviewed    Assessment/Plan  58 y.o. female with medical history significant of pancreatic cancer with metastasis to the liver currently on Gemcitabine and Abraxane     Admitted for cholecystitis for now conservative management can get cleared for surgery by oncology  Present on Admission:  Cholecystitis - admit for IV rehydration and pain control, bowel rest, and empiric antibiotics (zosyn)  Appreciate general surgery consult recommend continuing IV antibiotics for now to see if can treat conservatively.  Would like to have discussion with oncology first prior to proceeding to the OR given history of pancreatic cancer metastases to the liver which can interfere with healing   Pancreatic cancer metastasized to liver [facility]) - folowed by Dr. Burr Medico oncology is aware of patient condition.  Emailed Dr. 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  DVT prophylaxis:  SCD      Code Status:  FULL CODE  as per patient   I had personally discussed CODE STATUS with patient    Family Communication:   Family not at  Bedside    Disposition Plan:      To home once workup is complete and patient is stable                                        Consults called: general surgery  Admission status:  ED Disposition    None      inpatient     Expect 2 midnight stay secondary to severity of patient's current illness including     Severe lab/radiological/exam abnormalities including: Evidence of cholecystitis   and extensive  comorbidities including:   malignancy,   That are currently affecting medical management.   I expect  patient to be hospitalized for 2 midnights requiring inpatient medical care.  Patient is at high risk for adverse outcome (such as loss of life or disability) if not treated.  Indication for inpatient stay as follows:  severe pain requiring acute inpatient management,  inability to maintain oral hydration    Need for operative/procedural  intervention    Need for IV antibiotics, IV fluids,  IV pain medications      Level of care    medical floor    Precautions:  NONE  No active isolations  PPE: Used by the provider:   P100  eye Goggles,  Gloves      Daxx Tiggs 12/14/2018, 9:25 PM  [facility]     after 2 AM please page floor coverage PA If 7AM-7PM, please contact the day team taking care of the patient using [email or web address]

## 2018-12-14 NOTE — ED Provider Notes (Signed)
[facility] Provider Note   CSN: [id number] Arrival date & time: 12/14/18  1728    History   Chief Complaint Chief Complaint  Patient presents with   Abdominal Pain    HPI Morgan Dawson is a 58 y.o. female.     HPI Patient presents to the emergency department with low-grade fever with upper abdominal discomfort that started last night.  The patient is currently being treated for pancreatic cancer.  She states this pain is abnormal for her.  Patient states that she went to the cancer center where they did laboratory testing and CT scan imaging.  The patient states that they sent her here to the fact she had what they thought was cholecystitis.  Patient states that she has had some nausea but no vomiting.  The patient denies chest pain, shortness of breath, headache,blurred vision, neck pain,  cough, weakness, numbness, dizziness, anorexia, edema,  vomiting, diarrhea, rash, back pain, dysuria, hematemesis, bloody stool, near syncope, or syncope. Past Medical History:  Diagnosis Date   Diarrhea of presumed infectious origin 01/12/2018   Gastroenteritis 01/12/2018   Genetic testing 12/02/2017   Multi-Cancer panel (83 genes) @ [facility] - No pathogenic mutations detected   Meniere disease 2016   Pancreatitis     Patient Active Problem List   Diagnosis Date Noted   Liver metastases - presumed pancreatic cancer primary 12/14/2018   Cholelithiasis - calcified gallstones 12/14/2018   Acute calculous cholecystitis 12/14/2018   On antineoplastic chemotherapy 12/14/2018   Inguinal hernia, left 12/14/2018   Leg edema, left 12/14/2018   Hypokalemia 01/12/2018   Hypomagnesemia 01/12/2018   Thrombocytopenia ([place]) 01/12/2018   Febrile neutropenia ([place]) 01/12/2018   CKD (chronic kidney disease), stage III ([place]) 11/14/2017   Anemia due to antineoplastic chemotherapy 11/14/2017   Port-A-Cath in place 09/21/2017   Pancreatic cancer  metastasized to liver ([place]) 08/11/2017   Goals of care, counseling/discussion 08/11/2017   Pancreatic cancer ([place]) 07/29/2017   Hemangioma of liver, right lobe segment 5 12/13/2016   Vertigo 10/21/2015   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      Home Medications    Prior to Admission medications   Medication Sig Start Date End Date Taking? Authorizing Provider  b complex vitamins tablet Take 1 tablet by mouth daily.   Yes [provider]  gabapentin (NEURONTIN) 100 MG capsule Take 1 capsule (100 mg total) by mouth 3 (three) times daily. Patient taking differently: Take 100 mg by mouth 2 (two) times daily.  06/03/18  Yes [name], MD  potassium chloride (K-DUR) 10 MEQ tablet Take 1 tablet (10 mEq total) by mouth 4 (four) times daily. 06/08/18  Yes [name], MD  traMADol (ULTRAM) 50 MG tablet Take 1 tablet (50 mg total) by mouth every 6 (six) hours as needed. Patient taking differently: Take 50 mg by mouth every 6 (six) hours as needed for moderate pain or severe pain.  08/22/18  Yes [name], MD  loperamide (IMODIUM) 2 MG capsule Take 1 capsule (2 mg total) by mouth as needed for  diarrhea or loose stools. 01/15/18   Lavina Hamman, MD  loratadine (CLARITIN) 10 MG tablet Take 10 mg by mouth daily as needed for allergies.    [provider]  magic mouthwash w/lidocaine SOLN Take 10 mLs by mouth 3 (three) times daily as needed for mouth pain. Swish and spit 10 ML by mouth 3 times daily as needed for mouth pain. 08/31/17   Alla Feeling, NP  meclizine (ANTIVERT) 25 MG tablet Take 25 mg by mouth 3 (three) times daily as needed for  dizziness.    [provider]  ondansetron (ZOFRAN-ODT) 8 MG disintegrating tablet TAKE 1 TABLET BY MOUTH EVERY 8 HOURS AS NEEDED FOR NAUSE OR VOMITING Patient taking differently: Take 8 mg by mouth every 8 (eight) hours as needed for nausea or vomiting.  01/25/18   Alla Feeling, NP  prochlorperazine (COMPAZINE) 10 MG tablet Take 1 tablet (10 mg total) by mouth every 6 (six) hours as needed for nausea or vomiting. 12/06/17   Alla Feeling, NP    Family History Family History  Problem Relation Age of Onset   Colon cancer Father 64       currently 70   Colon cancer Maternal Grandfather    Breast cancer Cousin    Thyroid cancer Mother 83       facial radiation for acne as teen   Lymphoma Paternal Aunt 70       deceased 58   Breast cancer Paternal Aunt     Social History Social History   Tobacco Use   Smoking status: Never Smoker   Smokeless tobacco: Never Used  Substance Use Topics   Alcohol use: No    Frequency: Never   Drug use: No     Allergies   Patient has no known allergies.   Review of Systems Review of Systems All other systems negative except as documented in the HPI. All pertinent positives and negatives as reviewed in the HPI.  Physical Exam Updated Vital Signs BP (!) 143/83 (BP Location: Right Arm)    Pulse (!) 102    Temp 98.6 F (37 C) (Oral)    Resp 18    Ht 5\' 5"  (1.651 m)    Wt 47.2 kg    LMP 10/23/2015    SpO2 97%    BMI 17.32 kg/m   Physical Exam Vitals signs and nursing note reviewed.  Constitutional:      General: She is not in acute distress.    Appearance: She is well-developed.  HENT:     Head: Normocephalic and atraumatic.  Eyes:     Pupils: Pupils are equal, round, and reactive to light.  Neck:     Musculoskeletal: Normal range of motion and neck supple.  Cardiovascular:     Rate and Rhythm: Normal rate and regular rhythm.     Heart sounds: Normal heart sounds. No murmur. No friction rub. No gallop.   Pulmonary:       Effort: Pulmonary effort is normal. No respiratory distress.     Breath sounds: Normal breath sounds. No wheezing.  Abdominal:     General: Bowel sounds are normal. There is no distension.     Palpations: Abdomen is soft.     Tenderness: There is abdominal tenderness in the right upper quadrant and epigastric area.  Skin:    General: Skin is warm and dry.     Capillary Refill: Capillary refill takes less than 2 seconds.     Findings: No  erythema or rash.  Neurological:     Mental Status: She is alert and oriented to person, place, and time.     Motor: No abnormal muscle tone.     Coordination: Coordination normal.  Psychiatric:        Behavior: Behavior normal.      ED Treatments / Results  Labs (all labs ordered are listed, but only abnormal results are displayed) Labs Reviewed  COMPREHENSIVE METABOLIC PANEL  CBC  TYPE AND SCREEN    EKG None  Radiology Dg Chest 2 View  Result Date: 12/14/2018 CLINICAL DATA:  Abdominal pain EXAM: CHEST - 2 VIEW COMPARISON:  09/01/2018 PET FINDINGS: Right jugular Port-A-Cath with its tip at the cavoatrial junction is stable. Normal heart size. Clear lungs. No pneumothorax or pleural effusion. IMPRESSION: No active cardiopulmonary disease. Electronically Signed   By: Marybelle Killings M.D.   On: 12/14/2018 16:08   Ct Chest W Contrast  Addendum Date: 12/14/2018   ADDENDUM REPORT: 12/14/2018 19:17 ADDENDUM: Addendum created for clarification of findings related to the gallbladder. Distended gallbladder with inflammatory changes compatible with acute cholecystitis. The obstructive calcified stone is likely in the infundibulum/proximal cystic duct, not within common bile duct. No calcified stones are visualized within the common bile duct. Findings discussed with Dr. Johney Maine of general surgery. Electronically Signed   By: Corrie Mckusick D.O.   On: 12/14/2018 19:17   Result Date: 12/14/2018 CLINICAL DATA:  58 year old female with a history of  metastatic pancreatic cancer with right upper quadrant pain and fever EXAM: CT CHEST, ABDOMEN, AND PELVIS WITH CONTRAST TECHNIQUE: Multidetector CT imaging of the chest, abdomen and pelvis was performed following the standard protocol during bolus administration of intravenous contrast. CONTRAST:  162mL OMNIPAQUE IOHEXOL 300 MG/ML  SOLN COMPARISON:  Multiple prior PET CT most recent 09/01/2018, most recent CT abdomen 10/14/2017 FINDINGS: CT CHEST FINDINGS Cardiovascular: Heart size within normal limits. No pericardial fluid/thickening. Unremarkable course caliber contour of the thoracic aorta. Unremarkable diameter of the main pulmonary artery. No proximal filling defects. No significant atherosclerosis. Port catheter on the right chest wall with IJ approach. Mediastinum/Nodes: No adenopathy. Unremarkable appearance of the thoracic esophagus. Unremarkable thoracic inlet. Lungs/Pleura: No pneumothorax or pleural effusion. No confluent airspace disease. Atelectatic changes/scarring bilateral lungs, predominantly in the dependent lung bases, lingula, right middle lobe. Musculoskeletal: No acute displaced fracture. No significant degenerative changes. No bony canal narrowing. CT ABDOMEN PELVIS FINDINGS Hepatobiliary: There are treatment changes within the liver, with multiple regions of necrotic/cystic change, new from the baseline CT of 07/23/2017. Compared to the most recent PET/CT they are has been significant interval decrease in size of all of the lesions previously noted, involving all liver segments. Index lesion in segment 4 B measures 5.8 cm with internal necrosis. Index lesion within segment 5/6 measuring 5.7 cm, with internal necrosis. Index lesion segment 8 measures 3.2 cm with internal necrosis. Hypervascular region on the margin of a treated lesion within segment 5 (image 122 of series 3) likely perfusion anomaly/Thad. Distention of the gallbladder with gallbladder wall thickening and multiple intrahepatic  stones. Calcified stone in the hilum of the liver, likely within the cystic duct. No calcified stone is identified within the distal common bile duct. Pancreas: Unchanged appearance of the pancreas with mild dilation of the pancreatic duct in the body of the pancreas. No inflammatory changes or calcified parenchyma. Spleen: Unremarkable spleen Adrenals/Urinary Tract: Unremarkable appearance of the adrenal glands. No evidence of hydronephrosis of the right or left kidney. No nephrolithiasis. Unremarkable  course of the bilateral ureters. Unremarkable appearance of the urinary bladder. Stomach/Bowel: Unremarkable stomach. Small bowel unremarkable without abnormal distention or focal inflammatory changes. Enteric contrast reaches the terminal ileum. Moderate stool burden. No evidence of obstruction. Vascular/Lymphatic: No significant atherosclerotic changes. Edema within the fat at the liver hilum, surrounding the gallbladder within the right abdomen, with mild edema within the mesenteric fat of the mid abdomen. No adenopathy. Reproductive: Unremarkable uterus. Other: None Musculoskeletal: No acute displaced fracture. IMPRESSION: CT demonstrates acute cholecystitis, with choledocholithiasis of the cystic duct. These results were discussed by telephone at the time of interpretation on 12/14/2018 at 5:05 pm with Dr. Burr Medico. Redemonstration of known liver metastases, with positive response to therapy, interval reduction in size of all lesions, with multiple demonstrating significant internal necrosis. No acute finding of the chest. Ancillary findings as above. Electronically Signed: By: Corrie Mckusick D.O. On: 12/14/2018 17:12   Ct Abdomen Pelvis W Contrast  Addendum Date: 12/14/2018   ADDENDUM REPORT: 12/14/2018 19:17 ADDENDUM: Addendum created for clarification of findings related to the gallbladder. Distended gallbladder with inflammatory changes compatible with acute cholecystitis. The obstructive calcified stone is  likely in the infundibulum/proximal cystic duct, not within common bile duct. No calcified stones are visualized within the common bile duct. Findings discussed with Dr. Johney Maine of general surgery. Electronically Signed   By: Corrie Mckusick D.O.   On: 12/14/2018 19:17   Result Date: 12/14/2018 CLINICAL DATA:  58 year old female with a history of metastatic pancreatic cancer with right upper quadrant pain and fever EXAM: CT CHEST, ABDOMEN, AND PELVIS WITH CONTRAST TECHNIQUE: Multidetector CT imaging of the chest, abdomen and pelvis was performed following the standard protocol during bolus administration of intravenous contrast. CONTRAST:  117mL OMNIPAQUE IOHEXOL 300 MG/ML  SOLN COMPARISON:  Multiple prior PET CT most recent 09/01/2018, most recent CT abdomen 10/14/2017 FINDINGS: CT CHEST FINDINGS Cardiovascular: Heart size within normal limits. No pericardial fluid/thickening. Unremarkable course caliber contour of the thoracic aorta. Unremarkable diameter of the main pulmonary artery. No proximal filling defects. No significant atherosclerosis. Port catheter on the right chest wall with IJ approach. Mediastinum/Nodes: No adenopathy. Unremarkable appearance of the thoracic esophagus. Unremarkable thoracic inlet. Lungs/Pleura: No pneumothorax or pleural effusion. No confluent airspace disease. Atelectatic changes/scarring bilateral lungs, predominantly in the dependent lung bases, lingula, right middle lobe. Musculoskeletal: No acute displaced fracture. No significant degenerative changes. No bony canal narrowing. CT ABDOMEN PELVIS FINDINGS Hepatobiliary: There are treatment changes within the liver, with multiple regions of necrotic/cystic change, new from the baseline CT of 07/23/2017. Compared to the most recent PET/CT they are has been significant interval decrease in size of all of the lesions previously noted, involving all liver segments. Index lesion in segment 4 B measures 5.8 cm with internal necrosis. Index  lesion within segment 5/6 measuring 5.7 cm, with internal necrosis. Index lesion segment 8 measures 3.2 cm with internal necrosis. Hypervascular region on the margin of a treated lesion within segment 5 (image 122 of series 3) likely perfusion anomaly/Thad. Distention of the gallbladder with gallbladder wall thickening and multiple intrahepatic stones. Calcified stone in the hilum of the liver, likely within the cystic duct. No calcified stone is identified within the distal common bile duct. Pancreas: Unchanged appearance of the pancreas with mild dilation of the pancreatic duct in the body of the pancreas. No inflammatory changes or calcified parenchyma. Spleen: Unremarkable spleen Adrenals/Urinary Tract: Unremarkable appearance of the adrenal glands. No evidence of hydronephrosis of the right or left kidney. No nephrolithiasis. Unremarkable  course of the bilateral ureters. Unremarkable appearance of the urinary bladder. Stomach/Bowel: Unremarkable stomach. Small bowel unremarkable without abnormal distention or focal inflammatory changes. Enteric contrast reaches the terminal ileum. Moderate stool burden. No evidence of obstruction. Vascular/Lymphatic: No significant atherosclerotic changes. Edema within the fat at the liver hilum, surrounding the gallbladder within the right abdomen, with mild edema within the mesenteric fat of the mid abdomen. No adenopathy. Reproductive: Unremarkable uterus. Other: None Musculoskeletal: No acute displaced fracture. IMPRESSION: CT demonstrates acute cholecystitis, with choledocholithiasis of the cystic duct. These results were discussed by telephone at the time of interpretation on 12/14/2018 at 5:05 pm with Dr. Burr Medico. Redemonstration of known liver metastases, with positive response to therapy, interval reduction in size of all lesions, with multiple demonstrating significant internal necrosis. No acute finding of the chest. Ancillary findings as above. Electronically Signed:  By: Corrie Mckusick D.O. On: 12/14/2018 17:12    Procedures Procedures (including critical care time)  Medications Ordered in ED Medications  0.9 %  sodium chloride infusion ( Intravenous Stopped 12/14/18 2028)  lactated ringers bolus 1,000 mL (has no administration in time range)  ondansetron (ZOFRAN) injection 4 mg (4 mg Intravenous Given 12/14/18 2046)    Or  ondansetron (ZOFRAN) 8 mg in sodium chloride 0.9 % 50 mL IVPB ( Intravenous See Alternative 12/14/18 2046)  prochlorperazine (COMPAZINE) injection 5-10 mg (has no administration in time range)  lip balm (CARMEX) ointment 1 application (1 application Topical Not Given 12/14/18 2215)  magic mouthwash (has no administration in time range)  guaiFENesin-dextromethorphan (ROBITUSSIN DM) 100-10 MG/5ML syrup 10 mL (has no administration in time range)  hydrocortisone (ANUSOL-HC) 2.5 % rectal cream 1 application (has no administration in time range)  alum & mag hydroxide-simeth (MAALOX/MYLANTA) 200-200-20 MG/5ML suspension 30 mL (has no administration in time range)  hydrocortisone cream 1 % 1 application (has no administration in time range)  menthol-cetylpyridinium (CEPACOL) lozenge 3 mg (has no administration in time range)  phenol (CHLORASEPTIC) mouth spray 1-2 spray (has no administration in time range)  diphenhydrAMINE (BENADRYL) injection 12.5-25 mg (has no administration in time range)  HYDROmorphone (DILAUDID) injection 0.5-2 mg (has no administration in time range)  acetaminophen (TYLENOL) tablet 325-650 mg (has no administration in time range)  acetaminophen (TYLENOL) suppository 650 mg (has no administration in time range)  methocarbamol (ROBAXIN) 1,000 mg in dextrose 5 % 50 mL IVPB (has no administration in time range)  gabapentin (NEURONTIN) capsule 100 mg (100 mg Oral Given 12/14/18 2230)  loratadine (CLARITIN) tablet 10 mg (has no administration in time range)  meclizine (ANTIVERT) tablet 25 mg (25 mg Oral Given 12/14/18 2046)   traMADol (ULTRAM) tablet 50 mg (50 mg Oral Given 12/14/18 2230)  piperacillin-tazobactam (ZOSYN) IVPB 3.375 g (has no administration in time range)  potassium chloride 10 mEq in 100 mL IVPB (10 mEq Intravenous New Bag/Given 12/14/18 2250)  piperacillin-tazobactam (ZOSYN) IVPB 3.375 g (0 g Intravenous Stopped 12/14/18 2028)     Initial Impression / Assessment and Plan / ED Course  I have reviewed the triage vital signs and the nursing notes.  Pertinent labs & imaging results that were available during my care of the patient were reviewed by me and considered in my medical decision making (see chart for details).        Patient will be admitted to the hospital by the [facility].  I spoke with general surgery who will consult on the patient.  I did start antibiotics on her.  She was given IV  fluids as well.  She has been hemodynamically stable here in the emergency department.  Final Clinical Impressions(s) / ED Diagnoses   Final diagnoses:  Cholecystitis    ED Discharge Orders    None       Rebeca Allegra 12/14/18 2308    Blanchie Dessert, MD 12/16/18 2135

## 2018-12-14 NOTE — Progress Notes (Signed)
Pharmacy Antibiotic Note  Morgan Dawson is a 58 y.o. female admitted on 12/14/2018 with Cholecystitis and Choledocholithiasis.  Pharmacy has been consulted for zosyn dosing.  Plan: Zosyn 3.375g IV q8h (4 hour infusion).    Temp (24hrs), Avg:101 F (38.3 C), Min:100.4 F (38 C), Max:102.5 F (39.2 C)  Recent Labs  Lab 12/12/18 1056 12/14/18 1317  WBC 2.0* 7.4  CREATININE 1.04* 1.14*    Estimated Creatinine Clearance: 41 mL/min (A) (by C-G formula based on SCr of 1.14 mg/dL (H)).    No Known Allergies  Antimicrobials this admission: 4/22  ZEI>> Dose adjustments this admission:  Microbiology results: 4/22 UCx>>sent 4/22 Bcx2>>sent  Thank you for allowing pharmacy to be a part of this patient's care.  Eudelia Bunch, Pharm.D [phone] 12/14/2018 7:09 PM

## 2018-12-14 NOTE — Consult Note (Signed)
Morgan Dawson  [birth year] [id number]  CARE TEAM:  PCP: Morgan Small, MD  Outpatient Care Team: Patient Care Team: Morgan Small, MD as PCP - General (Family Medicine) Morgan Belfast, MD as Consulting Physician (Otolaryngology) Morgan Merle, MD as Consulting Physician (Oncology)  Inpatient Treatment Team: Treatment Team: Attending Provider: Blanchie Dessert, MD; Technician: Rolm Gala, EMT; Physician Assistant: Morgan Heading, PA-C; Registered Nurse: Morgan Chapel, RN; Registered Nurse: Morgan Hacker, RN; Consulting Physician: Morgan Merle, MD   This patient is a 58 y.o.female who presents today for surgical evaluation at the request of Morgan Dawson, [facility].   Chief complaint / Reason for evaluation: Gallstones & probable cholecystitis in the setting of metastatic pancreatic cancer  Woman with abdominal pain in 2018 and found to have liver masses.  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  Most likely culprit in the body of the pancreas.  Has undergone palliative chemotherapy under the care of Dr. Truitt [facility] at the [facility].  She has had numerous regimens adjusted. She was treated withfirst linechemoFOLFIRINOX with dose reduction.  Due to neuropathyand cytopenia, chemo changed tosecond line 5-FU pump infusion and liposomal irinotecan every 2 weekswith dose reductiondue to cytopenia.  She recently had disease progression withworseningliver mets. She started second-line Morgan Dawson and Abraxane2 weeks on/1 week offon 09/12/18.  Last dose 2 days ago.    She started complaining of recurrent upper abdominal pain and discomfort.  Chronically mildly elevated liver function tests.  Temperature of 100.5 at cancer Dawson concerning.  She has chronic anemia and thrombocytopenia presumed to be related to her chronic chemotherapy.  Blood culture, urinary test done.  Scan ordered as well.  Apparently no nausea or vomiting.  No  constipation or diarrhea.  She has had episode of gastroenteritis in the past.  CT scan done today shows gallbladder wall thickening suspicious for cholecystitis with chronic calcified gallstones.  She was told to go to the emergency room to sort out what to do.  No personal nor family history of inflammatory bowel disease, irritable bowel syndrome, allergy such as Morgan Dawson, dietary/dairy problems, colitis, ulcers nor gastritis.  No recent sick contacts/gastroenteritis.  No travel outside the country.  No changes in diet.  No dysphagia to solids or liquids.  No significant heartburn or reflux.  No hematochezia, hematemesis, coffee ground emesis.  No evidence of prior gastric/peptic ulceration.    Assessment  Morgan Dawson  58 y.o. female       Problem List:  Principal Problem:   Acute calculous cholecystitis Active Problems:   Pancreatic cancer Morgan Dawson)   Pancreatic cancer metastasized to liver Morgan Dawson)   Liver metastases - presumed pancreatic cancer primary   Port-A-Cath in place   CKD (chronic kidney disease), stage III (Morgan Dawson)   Anemia due to antineoplastic chemotherapy   Thrombocytopenia (HCC)   Vertigo   Cholelithiasis - calcified gallstones   On antineoplastic chemotherapy   Hemangioma of liver, right lobe segment 5   Cholecystitis   Probable acute calculus cholecystitis in the setting of metastatic pancreatic cancer with numerous liver metastases on chronic chemotherapy for the past year and a half.  Plan:  The initial impression talked about choledocholithiasis.  I called and clarified with Morgan Dawson with radiology.  His intention was to note that there are stones in the gallbladder and one is impacted in the cystic duct.  After discussion he would favor more of a Mirizzi's type syndrome.  He sees no definite evidence  of common bile duct obstruction or dilatation or choledocholithiasis/common bile duct stones.  To my view there is layering hyperintense of fluid in the  gallbladder.  I was worried about a possible fistula from the duodenum to the gallbladder.  However there is no gas.  Morgan Dawson suspects that it is most likely purulence or blood and not true enteral contrast getting into the gallbladder.  He would think if she had a chronic choleduodenal fistula, she would not get inflammation/cholecystitis.  Pneumobilia    Patient may benefit from an MRCP to get a better assessment of the biliary system.  We will table that for now, tonight.  Initially wondered if gastroenterology should be involved but of the common bile duct is not obstructed at this time, I do not know if they have much to add.  Defer to the team in the morning  IV antibiotics.  Zosyn given high risk  Medical admission and stabilization given her chronic chemotherapy and medical complexity.  Dr Morgan Dawson seeing patient  Standard of care is usually laparoscopic cholecystectomy.  However, with her having metastatic pancreatic cancer with numerous liver lesions that probably is a poor idea unless we can get a sense that the disease is free from that.  It is obvious she still has a liver lesions although not as bad on initial presentation.  I think her risk of having to go through tumor with nonhealing bile fistulas, etc. seems rather high on initial impression.  Would like to get medical clearance to assess if she could even tolerate an operation.  Would like input from her medical oncologist, Dr. Morey Dawson, who knows this patient better than the focusing her tonight to sort out what the goals and prognosis are.  Perhaps we can cool this down with antibiotics only.  Ma be percutaneous cholecystostomy by interventional radiology.  Try and stabilize her first and develop a better plan tomorrow  -thrombocytopenia.  Mild - probably not an issue   -anemia - stable  -left inguinal hernia Dawson & reducible.  Would not repair at this time, especially on chemotherapy with infection.    -VTE prophylaxis- SCDs,  etc -mobilize as tolerated to help recovery  55 minutes spent in review, evaluation, examination, counseling, and coordination of care.  More than 50% of that time was spent in counseling.  I updated the patient's status to the patient, nurse, PA.Marland Kitchen  Recommendations were made.  Questions were answered.  They expressed understanding & appreciation.   Adin Hector, MD, FACS, MASCRS Gastrointestinal and Minimally Invasive Surgery    [address] [place], [place] [address] [phone] Main / Paging [phone] Fax   12/14/2018      Past Medical History:  Diagnosis Date   Gastroenteritis 01/12/2018   Genetic testing 12/02/2017   Multi-Cancer panel (83 genes) @ [facility] - No pathogenic mutations detected   Meniere disease 2016   Pancreatitis     Past Surgical History:  Procedure Laterality Date   CERVICAL ABLATION     ENDOMETRIAL ABLATION  2011   IR FLUORO GUIDE PORT INSERTION RIGHT  08/12/2017   IR US GUIDE VASC ACCESS RIGHT  08/12/2017   MANDIBLE SURGERY  1999    Social History   Socioeconomic History   Marital status: Married    Spouse name: Not on file   Number of children: Not on file   Years of education: Not on file   Highest education level: Not on file  Occupational History  Not on file  Social Needs   Financial resource strain: Not hard at all   Food insecurity:    Worry: Never true    Inability: Never true   Transportation needs:    Medical: No    Non-medical: No  Tobacco Use   Smoking status: Never Smoker   Smokeless tobacco: Never Used  Substance and Sexual Activity   Alcohol use: No    Frequency: Never   Drug use: No   Sexual activity: Not Currently  Lifestyle   Physical activity:    Days per week: 4 days    Minutes per session: 30 min   Stress: Not at all  Relationships   Social connections:    Talks on phone: Patient refused    Gets together: Patient refused    Attends religious  service: Patient refused    Active member of club or organization: Patient refused    Attends meetings of clubs or organizations: Patient refused    Relationship status: Patient refused   Intimate partner violence:    Fear of current or ex partner: Patient refused    Emotionally abused: Patient refused    Physically abused: Patient refused    Forced sexual activity: Patient refused  Other Topics Concern   Not on file  Social History Narrative   Not on file    Family History  Problem Relation Age of Onset   Colon cancer Father 24       currently 15   Colon cancer Maternal Grandfather    Breast cancer Cousin    Thyroid cancer Mother 28       facial radiation for acne as teen   Lymphoma Paternal Aunt 70       deceased 3   Breast cancer Paternal Aunt     Current Facility-Administered Medications  Medication Dose Route Frequency Provider Last Rate Last Dose   0.9 %  sodium chloride infusion   Intravenous PRN Blanchie Dessert, MD       acetaminophen (TYLENOL) suppository 650 mg  650 mg Rectal Q6H PRN Michael Boston, MD       acetaminophen (TYLENOL) tablet 325-650 mg  325-650 mg Oral Q6H PRN Michael Boston, MD       alum & mag hydroxide-simeth (MAALOX/MYLANTA) 200-200-20 MG/5ML suspension 30 mL  30 mL Oral Q6H PRN Michael Boston, MD       diphenhydrAMINE (BENADRYL) injection 12.5-25 mg  12.5-25 mg Intravenous Q6H PRN Michael Boston, MD       gabapentin (NEURONTIN) capsule 100 mg  100 mg Oral TID Michael Boston, MD       guaiFENesin-dextromethorphan (ROBITUSSIN DM) 100-10 MG/5ML syrup 10 mL  10 mL Oral Q4H PRN Michael Boston, MD       hydrocortisone (ANUSOL-HC) 2.5 % rectal cream 1 application  1 application Topical QID PRN Michael Boston, MD       hydrocortisone cream 1 % 1 application  1 application Topical TID PRN Michael Boston, MD       HYDROmorphone (DILAUDID) injection 0.5-2 mg  0.5-2 mg Intravenous Q2H PRN Hannia Matchett, Remo Lipps, MD       lactated ringers bolus 1,000 mL   1,000 mL Intravenous Q8H PRN Willette Mudry, Remo Lipps, MD       lip balm (CARMEX) ointment 1 application  1 application Topical BID Shelbylynn Walczyk, Remo Lipps, MD       loratadine (CLARITIN) tablet 10 mg  10 mg Oral Daily PRN Michael Boston, MD       magic mouthwash  15 mL  Oral QID PRN Michael Boston, MD       meclizine (ANTIVERT) tablet 25 mg  25 mg Oral TID PRN Michael Boston, MD       menthol-cetylpyridinium (CEPACOL) lozenge 3 mg  1 lozenge Oral PRN Michael Boston, MD       methocarbamol (ROBAXIN) 1,000 mg in dextrose 5 % 50 mL IVPB  1,000 mg Intravenous Q6H PRN Michael Boston, MD       ondansetron [facility]) injection 4 mg  4 mg Intravenous Q6H PRN Michael Boston, MD       Or   ondansetron (ZOFRAN) 8 mg in sodium chloride 0.9 % 50 mL IVPB  8 mg Intravenous Q6H PRN Michael Boston, MD       phenol (CHLORASEPTIC) mouth spray 1-2 spray  1-2 spray Mouth/Throat PRN Michael Boston, MD       piperacillin-tazobactam (ZOSYN) IVPB 3.375 g  3.375 g Intravenous Once Morgan Heading, PA-C       [START ON 12/15/2018] piperacillin-tazobactam (ZOSYN) IVPB 3.375 g  3.375 g Intravenous Q8H Bell, Michelle T, RPH       prochlorperazine (COMPAZINE) injection 5-10 mg  5-10 mg Intravenous Q4H PRN Michael Boston, MD       traMADol Veatrice Bourbon) tablet 50 mg  50 mg Oral Q6H PRN Michael Boston, MD       Current Outpatient Medications  Medication Sig Dispense Refill   gabapentin (NEURONTIN) 100 MG capsule Take 1 capsule (100 mg total) by mouth 3 (three) times daily. 90 capsule 2   loperamide (IMODIUM) 2 MG capsule Take 1 capsule (2 mg total) by mouth as needed for diarrhea or loose stools. 30 capsule 0   loratadine (CLARITIN) 10 MG tablet Take 10 mg by mouth daily as needed for allergies.     magic mouthwash w/lidocaine SOLN Take 10 mLs by mouth 3 (three) times daily as needed for mouth pain. Swish and spit 10 ML by mouth 3 times daily as needed for mouth pain. 240 mL 0   meclizine (ANTIVERT) 25 MG tablet Take 25 mg by mouth 3 (three)  times daily as needed for dizziness.     ondansetron (ZOFRAN-ODT) 8 MG disintegrating tablet TAKE 1 TABLET BY MOUTH EVERY 8 HOURS AS NEEDED FOR NAUSE OR VOMITING 40 tablet 0   potassium chloride (K-DUR) 10 MEQ tablet Take 1 tablet (10 mEq total) by mouth 4 (four) times daily. 360 tablet 2   prochlorperazine (COMPAZINE) 10 MG tablet Take 1 tablet (10 mg total) by mouth every 6 (six) hours as needed for nausea or vomiting. 40 tablet 2   traMADol (ULTRAM) 50 MG tablet Take 1 tablet (50 mg total) by mouth every 6 (six) hours as needed. 30 tablet 0   Facility-Administered Medications Ordered in Other Encounters  Medication Dose Route Frequency Provider Last Rate Last Dose   heparin lock flush 100 unit/mL  500 Units Intravenous Once Harle Stanford., PA-C       heparin lock flush 100 unit/mL  500 Units Intracatheter Once Morgan Merle, MD       sodium chloride (PF) 0.9 % injection            sodium chloride flush (NS) 0.9 % injection 10 mL  10 mL Intracatheter Once Morgan Merle, MD         No Known Allergies  ROS:   All other systems reviewed & are negative except per HPI or as noted below: Constitutional:  +fevers, chills.  No sweats.  Weight stable Eyes:  No vision  changes, No discharge HENT:  No sore throats, nasal drainage Lymph: No neck swelling, No bruising easily Pulmonary:  No cough, productive sputum CV: No orthopnea, PND  Patient walks 20 minutes for about 1/2-1 miles without difficulty.  No exertional chest/neck/shoulder/arm pain. GI:  + Pancreatic CA per HPI.  No personal nor family history of GI/colon cancer, inflammatory bowel disease, irritable bowel syndrome, allergy such as Morgan Dawson, dietary/dairy problems, colitis, ulcers nor gastritis.  No recent sick contacts/gastroenteritis.  No travel outside the country.  No changes in diet. Renal: No UTIs, No hematuria Genital:  No drainage, bleeding, masses Musculoskeletal: No severe joint pain.  Good ROM major joints Skin:  No sores  or lesions.  No rashes Heme/Lymph:  No easy bleeding.  No swollen lymph nodes Neuro: No focal weakness/numbness.  No seizures Psych: No suicidal ideation.  No hallucinations  BP 112/70    Pulse (!) 104    Temp (!) 100.6 F (38.1 C) (Oral)    Resp 20    LMP 10/23/2015    SpO2 97%   Physical Exam: General: Pt awake/alert/oriented x4 in no major acute distress.  Cachectic but bright/alert Eyes: PERRL, normal EOM. Sclera nonicteric Neuro: CN II-XII intact w/o focal sensory/motor deficits. Lymph: No head/neck/groin lymphadenopathy Psych:  No delerium/psychosis/paranoia HENT: Normocephalic, Mucus membranes moist.  No thrush Neck: Supple, No tracheal deviation Chest: No pain.  Good respiratory excursion. CV:  Pulses intact.  Regular rhythm Abdomen: Soft, Nondistended.  RUQ TTP with Percell Miller sign.  Nontender.  No incarcerated hernias. Gen:  Dawson left inguinal hernia - reducible.  No right inguinal hernia.  No inguinal lymphadenopathy.   Ext:  SCDs BLE.  No significant edema.  No cyanosis Skin: No petechiae / purpurea.  No major sores Musculoskeletal: No severe joint pain.  Good ROM major joints   Results:   Labs: Results for orders placed or performed in visit on 12/14/18 (from the past 48 hour(s))  Comprehensive metabolic panel     Status: Abnormal   Collection Time: 12/14/18  1:17 PM  Result Value Ref Range   Sodium 135 135 - 145 mmol/L   Potassium 3.5 3.5 - 5.1 mmol/L   Chloride 99 98 - 111 mmol/L   CO2 27 22 - 32 mmol/L   Glucose, Bld 147 (H) 70 - 99 mg/dL   BUN 23 (H) 6 - 20 mg/dL   Creatinine, Ser 1.14 (H) 0.44 - 1.00 mg/dL   Calcium 9.0 8.9 - 10.3 mg/dL   Total Protein 7.7 6.5 - 8.1 g/dL   Albumin 3.4 (L) 3.5 - 5.0 g/dL   AST 55 (H) 15 - 41 U/L   ALT 57 (H) 0 - 44 U/L   Alkaline Phosphatase 354 (H) 38 - 126 U/L   Total Bilirubin 1.5 (H) 0.3 - 1.2 mg/dL   GFR calc non Af Amer 54 (L) >60 mL/min   GFR calc Af Amer >60 >60 mL/min   Anion gap 9 5 - 15    Comment: Performed  at [facility], [address], [place], [place] [address]  CBC with Differential     Status: Abnormal   Collection Time: 12/14/18  1:17 PM  Result Value Ref Range   WBC 7.4 4.0 - 10.5 K/uL   RBC 2.56 (L) 3.87 - 5.11 MIL/uL   Hemoglobin 8.9 (L) 12.0 - 15.0 g/dL   HCT 26.3 (L) 36.0 - 46.0 %   MCV 102.7 (H) 80.0 - 100.0 fL   MCH 34.8 (H) 26.0 -  34.0 pg   MCHC 33.8 30.0 - 36.0 g/dL   RDW 15.1 11.5 - 15.5 %   Platelets 132 (L) 150 - 400 K/uL   nRBC 0.0 0.0 - 0.2 %   Neutrophils Relative % 96 %   Neutro Abs 7.1 1.7 - 7.7 K/uL   Lymphocytes Relative 2 %   Lymphs Abs 0.2 (L) 0.7 - 4.0 K/uL   Monocytes Relative 1 %   Monocytes Absolute 0.1 0.1 - 1.0 K/uL   Eosinophils Relative 0 %   Eosinophils Absolute 0.0 0.0 - 0.5 K/uL   Basophils Relative 0 %   Basophils Absolute 0.0 0.0 - 0.1 K/uL   Immature Granulocytes 1 %   Abs Immature Granulocytes 0.05 0.00 - 0.07 K/uL    Comment: Performed at [facility], [place] [address], [place], [place] [address]  Culture, Blood     Status: None (Preliminary result)   Collection Time: 12/14/18  1:27 PM  Result Value Ref Range   Specimen Description      BLOOD LEFT ARM Performed at [facility], [place] [address], [place], [place] [address]    Special Requests      BOTTLES DRAWN AEROBIC AND ANAEROBIC Blood Culture results may not be optimal due to an excessive volume of blood received in culture bottles Performed at [place] [address] [place], [place] [address]    Culture PENDING    Report Status PENDING   Amylase     Status: None   Collection Time: 12/14/18  1:32 PM  Result Value Ref Range   Amylase 53 28 - 100 U/L    Comment: Performed at [facility], [place] [address], [place], [place] [address]  Lipase, blood     Status: Abnormal   Collection Time: 12/14/18  1:32 PM  Result Value Ref Range   Lipase 65 (H) 11 - 51 U/L    Comment: Performed  at [facility], [place] [address], [place], [place] [address]  Culture, Blood     Status: None (Preliminary result)   Collection Time: 12/14/18  1:50 PM  Result Value Ref Range   Specimen Description BLOOD PORTA CATH    Special Requests      BOTTLES DRAWN AEROBIC AND ANAEROBIC Blood Culture adequate volume Performed at [facility], [place] [address], [place], [place] [address]    Culture PENDING    Report Status PENDING   Urinalysis, Complete w Microscopic     Status: Abnormal   Collection Time: 12/14/18  2:38 PM  Result Value Ref Range   Color, Urine AMBER (A) YELLOW    Comment: BIOCHEMICALS MAY BE AFFECTED BY COLOR   APPearance HAZY (A) CLEAR   Specific Gravity, Urine 1.024 1.005 - 1.030   pH 5.0 5.0 - 8.0   Glucose, UA NEGATIVE NEGATIVE mg/dL   Hgb urine dipstick Dawson (A) NEGATIVE   Bilirubin Urine NEGATIVE NEGATIVE   Ketones, ur NEGATIVE NEGATIVE mg/dL   Protein, ur 30 (A) NEGATIVE mg/dL   Nitrite NEGATIVE NEGATIVE   Leukocytes,Ua NEGATIVE NEGATIVE   RBC / HPF 0-5 0 - 5 RBC/hpf   WBC, UA 0-5 0 - 5 WBC/hpf   Bacteria, UA NONE SEEN NONE SEEN   Squamous Epithelial / LPF 0-5 0 - 5   Mucus PRESENT    Granular Casts, UA PRESENT     Comment: Performed at [facility], [place] [address], [place],  [address]  Imaging / Studies: Dg Chest 2 View  Result Date: 12/14/2018 CLINICAL DATA:  Abdominal pain EXAM: CHEST - 2 VIEW COMPARISON:  09/01/2018 PET FINDINGS: Right jugular Port-A-Cath with its tip at the cavoatrial junction is stable. Normal heart size. Clear lungs. No pneumothorax or pleural effusion. IMPRESSION: No active cardiopulmonary disease. Electronically Signed   By: Marybelle Killings M.D.   On: 12/14/2018 16:08   Ct Chest W Contrast  Result Date: 12/14/2018 CLINICAL DATA:  58 year old female with a history of metastatic pancreatic cancer with right upper quadrant pain and fever EXAM: CT CHEST, ABDOMEN, AND PELVIS WITH CONTRAST  TECHNIQUE: Multidetector CT imaging of the chest, abdomen and pelvis was performed following the standard protocol during bolus administration of intravenous contrast. CONTRAST:  164mL OMNIPAQUE IOHEXOL 300 MG/ML  SOLN COMPARISON:  Multiple prior PET CT most recent 09/01/2018, most recent CT abdomen 10/14/2017 FINDINGS: CT CHEST FINDINGS Cardiovascular: Heart size within normal limits. No pericardial fluid/thickening. Unremarkable course caliber contour of the thoracic aorta. Unremarkable diameter of the main pulmonary artery. No proximal filling defects. No significant atherosclerosis. Port catheter on the right chest wall with IJ approach. Mediastinum/Nodes: No adenopathy. Unremarkable appearance of the thoracic esophagus. Unremarkable thoracic inlet. Lungs/Pleura: No pneumothorax or pleural effusion. No confluent airspace disease. Atelectatic changes/scarring bilateral lungs, predominantly in the dependent lung bases, lingula, right middle lobe. Musculoskeletal: No acute displaced fracture. No significant degenerative changes. No bony canal narrowing. CT ABDOMEN PELVIS FINDINGS Hepatobiliary: There are treatment changes within the liver, with multiple regions of necrotic/cystic change, new from the baseline CT of 07/23/2017. Compared to the most recent PET/CT they are has been significant interval decrease in size of all of the lesions previously noted, involving all liver segments. Index lesion in segment 4 B measures 5.8 cm with internal necrosis. Index lesion within segment 5/6 measuring 5.7 cm, with internal necrosis. Index lesion segment 8 measures 3.2 cm with internal necrosis. Hypervascular region on the margin of a treated lesion within segment 5 (image 122 of series 3) likely perfusion anomaly/Thad. Distention of the gallbladder with gallbladder wall thickening and multiple intrahepatic stones. Calcified stone in the hilum of the liver, likely within the cystic duct. No calcified stone is identified  within the distal common bile duct. Pancreas: Unchanged appearance of the pancreas with mild dilation of the pancreatic duct in the body of the pancreas. No inflammatory changes or calcified parenchyma. Spleen: Unremarkable spleen Adrenals/Urinary Tract: Unremarkable appearance of the adrenal glands. No evidence of hydronephrosis of the right or left kidney. No nephrolithiasis. Unremarkable course of the bilateral ureters. Unremarkable appearance of the urinary bladder. Stomach/Bowel: Unremarkable stomach. Dawson bowel unremarkable without abnormal distention or focal inflammatory changes. Enteric contrast reaches the terminal ileum. Moderate stool burden. No evidence of obstruction. Vascular/Lymphatic: No significant atherosclerotic changes. Edema within the fat at the liver hilum, surrounding the gallbladder within the right abdomen, with mild edema within the mesenteric fat of the mid abdomen. No adenopathy. Reproductive: Unremarkable uterus. Other: None Musculoskeletal: No acute displaced fracture. IMPRESSION: CT demonstrates acute cholecystitis, with choledocholithiasis of the cystic duct. These results were discussed by telephone at the time of interpretation on 12/14/2018 at 5:05 pm with Dr. Burr Medico. Redemonstration of known liver metastases, with positive response to therapy, interval reduction in size of all lesions, with multiple demonstrating significant internal necrosis. No acute finding of the chest. Ancillary findings as above. Electronically Signed   By: Corrie Mckusick D.O.   On: 12/14/2018 17:12   Ct Abdomen Pelvis W Contrast  Result  Date: 12/14/2018 CLINICAL DATA:  58 year old female with a history of metastatic pancreatic cancer with right upper quadrant pain and fever EXAM: CT CHEST, ABDOMEN, AND PELVIS WITH CONTRAST TECHNIQUE: Multidetector CT imaging of the chest, abdomen and pelvis was performed following the standard protocol during bolus administration of intravenous contrast. CONTRAST:  122mL  OMNIPAQUE IOHEXOL 300 MG/ML  SOLN COMPARISON:  Multiple prior PET CT most recent 09/01/2018, most recent CT abdomen 10/14/2017 FINDINGS: CT CHEST FINDINGS Cardiovascular: Heart size within normal limits. No pericardial fluid/thickening. Unremarkable course caliber contour of the thoracic aorta. Unremarkable diameter of the main pulmonary artery. No proximal filling defects. No significant atherosclerosis. Port catheter on the right chest wall with IJ approach. Mediastinum/Nodes: No adenopathy. Unremarkable appearance of the thoracic esophagus. Unremarkable thoracic inlet. Lungs/Pleura: No pneumothorax or pleural effusion. No confluent airspace disease. Atelectatic changes/scarring bilateral lungs, predominantly in the dependent lung bases, lingula, right middle lobe. Musculoskeletal: No acute displaced fracture. No significant degenerative changes. No bony canal narrowing. CT ABDOMEN PELVIS FINDINGS Hepatobiliary: There are treatment changes within the liver, with multiple regions of necrotic/cystic change, new from the baseline CT of 07/23/2017. Compared to the most recent PET/CT they are has been significant interval decrease in size of all of the lesions previously noted, involving all liver segments. Index lesion in segment 4 B measures 5.8 cm with internal necrosis. Index lesion within segment 5/6 measuring 5.7 cm, with internal necrosis. Index lesion segment 8 measures 3.2 cm with internal necrosis. Hypervascular region on the margin of a treated lesion within segment 5 (image 122 of series 3) likely perfusion anomaly/Thad. Distention of the gallbladder with gallbladder wall thickening and multiple intrahepatic stones. Calcified stone in the hilum of the liver, likely within the cystic duct. No calcified stone is identified within the distal common bile duct. Pancreas: Unchanged appearance of the pancreas with mild dilation of the pancreatic duct in the body of the pancreas. No inflammatory changes or  calcified parenchyma. Spleen: Unremarkable spleen Adrenals/Urinary Tract: Unremarkable appearance of the adrenal glands. No evidence of hydronephrosis of the right or left kidney. No nephrolithiasis. Unremarkable course of the bilateral ureters. Unremarkable appearance of the urinary bladder. Stomach/Bowel: Unremarkable stomach. Dawson bowel unremarkable without abnormal distention or focal inflammatory changes. Enteric contrast reaches the terminal ileum. Moderate stool burden. No evidence of obstruction. Vascular/Lymphatic: No significant atherosclerotic changes. Edema within the fat at the liver hilum, surrounding the gallbladder within the right abdomen, with mild edema within the mesenteric fat of the mid abdomen. No adenopathy. Reproductive: Unremarkable uterus. Other: None Musculoskeletal: No acute displaced fracture. IMPRESSION: CT demonstrates acute cholecystitis, with choledocholithiasis of the cystic duct. These results were discussed by telephone at the time of interpretation on 12/14/2018 at 5:05 pm with Dr. Burr Medico. Redemonstration of known liver metastases, with positive response to therapy, interval reduction in size of all lesions, with multiple demonstrating significant internal necrosis. No acute finding of the chest. Ancillary findings as above. Electronically Signed   By: Corrie Mckusick D.O.   On: 12/14/2018 17:12    Medications / Allergies: per chart  Antibiotics: Anti-infectives (From admission, onward)   Start     Dose/Rate Route Frequency Ordered Stop   12/15/18 0200  piperacillin-tazobactam (ZOSYN) IVPB 3.375 g     3.375 g 12.5 mL/hr over 240 Minutes Intravenous Every 8 hours 12/14/18 1910     12/14/18 1900  piperacillin-tazobactam (ZOSYN) IVPB 3.375 g     3.375 g 100 mL/hr over 30 Minutes Intravenous  Once 12/14/18 1845  Note: Portions of this report may have been transcribed using voice recognition software. Every effort was made to ensure accuracy; however,  inadvertent computerized transcription errors may be present.   Any transcriptional errors that result from this process are unintentional.    Adin Hector, MD, FACS, MASCRS Gastrointestinal and Minimally Invasive Surgery    [address], [place], [place] [address] [phone] Main / Paging [phone] Fax   12/14/2018

## 2018-12-14 NOTE — ED Triage Notes (Signed)
Patient is coming from the cancer center with complaint abdominal pain and fever. Pt reportedly had a CT scan that showed gallbladder enlargement and stones.

## 2018-12-14 NOTE — Patient Instructions (Signed)

## 2018-12-14 NOTE — Progress Notes (Signed)
Two sets of blood cultures drawn, one from [place] and one from port, before starting any abx.  Per PA Lucianne Lei d/t pt's temp of 100.5 and need for abdominal scans pt is to be placed on NPO orders (other than contrast) and no oral tylenol or IVF/other medications are to be given at this time.  Pt states her pain is gone at rest but increases to 6/10 with abdominal palpation and movement.  Denies N/V/D/C or changes in bowels/urination.  No obvious swelling visible in abdomen.  Denies recent injury.  A&Ox4.  Pt's port left accessed with sorbaview dressing, AM disc, and power port needle in place, saline locked.  Pt drank both bottles of contrast for her CT scan, tolerated well.  Escorted via wc by RN to radiology for CT and CXR, belongings secured in [facility].  Pt transported to ED via [facility] with PA Lucianne Lei and RN Learta Codding.  Report given to RN Orpah Greek.  Belongings with patient.

## 2018-12-14 NOTE — ED Notes (Signed)
Bed: WA07 Expected date:  Expected time:  Means of arrival:  Comments: Cancer

## 2018-12-14 NOTE — ED Notes (Signed)
Gave report to [place], Therapist, sports for room [phone]

## 2018-12-14 NOTE — Progress Notes (Addendum)
[facility] Progress Note   Morgan Dawson [id number] March 06, 1961 58 y.o.  Morgan Dawson is managed by Dr. Truitt Dawson  Actively treated with chemotherapy/immunotherapy/hormonal therapy: yes  Current therapy: Gemcitabine and Abraxane  Last treated: 12/12/2018 (cycle 5, day 8)  Assessment: Plan:    Port-A-Cath in place - Plan: heparin lock flush 100 unit/mL  Pancreatic cancer metastasized to liver ([place]) - Plan: heparin lock flush 100 unit/mL  Fever, unspecified fever cause  Cholecystitis  Choledocholithiasis   Metastatic pancreatic cancer with liver metastasis: The patient continues to be followed by Dr. Truitt Dawson and is status post cycle 5, day 8 of gemcitabine and Abraxane which was dosed on 12/12/2018.  Restaging CT scans completed today returned showing:  CT demonstrates acute cholecystitis, with choledocholithiasis of the cystic duct.  These results were discussed by telephone at the time of interpretation on 12/14/2018 at 5:05 pm with Dr. Burr Dawson.  Redemonstration of known liver metastases, with positive response to therapy, interval reduction in size of all lesions, with multiple demonstrating significant internal necrosis.  No acute finding of the chest.   Cholecystitis with choledocholithiasis of the cystic duct and fever:  A CT scan from today returned showing acute cholecystitis, with choledocholithiasis of the cystic duct.  She was taken to the ER for evaluation and management.    Please see After Visit Summary for patient specific instructions.  Future Appointments  Date Time Provider [place]  12/26/2018  9:00 AM [facility] LAB 5 [facility] None  12/26/2018  9:15 AM [facility] [place] FLUSH [facility] None  12/26/2018  9:45 AM Morgan Merle, MD [facility] None  12/26/2018 10:15 AM [facility] INFUSION [facility] None  01/02/2019 10:30 AM [facility] LAB 2 [facility] None  01/02/2019 10:45 AM [facility] [place] FLUSH [facility] None  01/02/2019 11:15 AM  [facility] INFUSION [facility] None    No orders of the defined types were placed in this encounter.      Subjective:   Patient ID:  Morgan Dawson is a 58 y.o. (DOB [birth year]) female.  Chief Complaint:  Chief Complaint  Patient presents with  . Abdominal Pain    HPI Morgan Dawson   is a 58 year old female with a history of a metastatic pancreatic cancer with liver metastasis who is managed by Dr. Truitt Dawson and is status post cycle 5, day 8 of gemcitabine and Abraxane which was dosed on 12/12/2018.  The patient reports that she has had abdominal pain with radiation to her left back since last Thursday.  Friday her abdominal pain continued but her lower back pain improved.  She gradually does developed right upper quadrant pain.  She reports that her left abdominal pain and left back pain were better on Sunday but that her right upper quadrant pain continued.  On Monday she reported that she had no issue.  She reports that she has had fevers following chemotherapy.  She had a temperature of 102.6 after her treatment on Monday.  She took Tylenol with resolution of her fever.  Yesterday her temperature was 99.2 dropped to 98.6 later in the day.  This morning her temperature was 100.1.  Her right upper quadrant pain continues.  When she was checked in for her appointment today her temperature was noted to be 102.5.  Based on this she was pancultured with a urine culture, blood cultures x2, urinalysis and had a chemistry panel and CBC completed.  Her CBC and chemistry panel are as noted below.  Her urinalysis did not show evidence  of a urinary tract infection.  Her urine culture is pending along with her blood cultures.  She was referred for a chest x-ray which showed no acute pathology.  CT scans of her chest abdomen pelvis returned with results as follows:  CT demonstrates acute cholecystitis, with choledocholithiasis of the cystic duct.  Redemonstration of known liver metastases, with  positive response to therapy, interval reduction in size of all lesions, with multiple demonstrating significant internal necrosis.  No acute finding of the chest.  Based on these acute findings of cholecystitis and cholelithiasis of the cystic duct she was transported to the emergency room for evaluation and management.  Dr. Truitt Dawson will follow Morgan Dawson while she is hospitalized.  Medications: I have reviewed the patient's current medications.  Allergies: No Known Allergies  Past Medical History:  Diagnosis Date  . Genetic testing 12/02/2017   Multi-Cancer panel (83 genes) @ [facility] - No pathogenic mutations detected  . Meniere disease 2016  . Pancreatitis     Past Surgical History:  Procedure Laterality Date  . CERVICAL ABLATION    . ENDOMETRIAL ABLATION  2011  . IR FLUORO GUIDE PORT INSERTION RIGHT  08/12/2017  . IR US GUIDE VASC ACCESS RIGHT  08/12/2017  . MANDIBLE SURGERY  1999    Family History  Problem Relation Age of Onset  . Colon cancer Father 63       currently 68  . Colon cancer Maternal Grandfather   . Breast cancer Cousin   . Thyroid cancer Mother 68       facial radiation for acne as teen  . Lymphoma Paternal Aunt 73       deceased 66  . Breast cancer Paternal Aunt     Social History   Socioeconomic History  . Marital status: Married    Spouse name: Not on file  . Number of children: Not on file  . Years of education: Not on file  . Highest education level: Not on file  Occupational History  . Not on file  Social Needs  . Financial resource strain: Not hard at all  . Food insecurity:    Worry: Never true    Inability: Never true  . Transportation needs:    Medical: No    Non-medical: No  Tobacco Use  . Smoking status: Never Smoker  . Smokeless tobacco: Never Used  Substance and Sexual Activity  . Alcohol use: No    Frequency: Never  . Drug use: No  . Sexual activity: Not Currently  Lifestyle  . Physical activity:    Days per week:  4 days    Minutes per session: 30 min  . Stress: Not at all  Relationships  . Social connections:    Talks on phone: Patient refused    Gets together: Patient refused    Attends religious service: Patient refused    Active member of club or organization: Patient refused    Attends meetings of clubs or organizations: Patient refused    Relationship status: Patient refused  . Intimate partner violence:    Fear of current or ex partner: Patient refused    Emotionally abused: Patient refused    Physically abused: Patient refused    Forced sexual activity: Patient refused  Other Topics Concern  . Not on file  Social History Narrative  . Not on file    Past Medical History, Surgical history, Social history, and Family history were reviewed and updated as appropriate.   Please see review of  systems for further details on the patient's review from today.   Review of Systems:  Review of Systems  Constitutional: Positive for fever. Negative for activity change, appetite change, chills and diaphoresis.  HENT: Negative for trouble swallowing.   Respiratory: Negative for cough, chest tightness and shortness of breath.   Cardiovascular: Negative for chest pain, palpitations and leg swelling.  Gastrointestinal: Positive for abdominal pain. Negative for abdominal distention, constipation, diarrhea, nausea and vomiting.  Genitourinary: Negative for difficulty urinating and dysuria.  Neurological: Negative for headaches.    Objective:   Physical Exam:  BP 122/75 (BP Location: Right Arm, Patient Position: Sitting)   Pulse (!) 108   Temp (!) 100.4 F (38 C) (Oral)   Resp 18   LMP 10/23/2015   SpO2 100%  ECOG: 1  Physical Exam Constitutional:      General: She is not in acute distress.    Appearance: She is not diaphoretic.  HENT:     Head: Normocephalic and atraumatic.     Mouth/Throat:     Pharynx: No oropharyngeal exudate.  Eyes:     General: No scleral icterus. Neck:      Musculoskeletal: Normal range of motion and neck supple.  Cardiovascular:     Rate and Rhythm: Regular rhythm. Tachycardia present.     Heart sounds: Normal heart sounds. No murmur. No friction rub. No gallop.   Pulmonary:     Effort: Pulmonary effort is normal. No respiratory distress.     Breath sounds: Normal breath sounds. No wheezing or rales.  Abdominal:     General: Bowel sounds are normal. There is no distension.     Palpations: Abdomen is soft. There is no mass.     Tenderness: There is abdominal tenderness in the right upper quadrant. There is no guarding or rebound.  Lymphadenopathy:     Cervical: No cervical adenopathy.  Skin:    General: Skin is warm and dry.     Findings: No erythema or rash.  Neurological:     Mental Status: She is alert.     Coordination: Coordination normal.  Psychiatric:        Mood and Affect: Mood normal. Mood is not anxious or depressed.        Behavior: Behavior normal.        Thought Content: Thought content normal.        Judgment: Judgment normal.     Lab Review:     Component Value Date/Time   NA 135 12/14/2018 1317   NA 134 (L) 08/23/2017 0951   K 3.5 12/14/2018 1317   K 2.5 Repeated and Verified (LL) 08/23/2017 0951   CL 99 12/14/2018 1317   CO2 27 12/14/2018 1317   CO2 26 08/23/2017 0951   GLUCOSE 147 (H) 12/14/2018 1317   GLUCOSE 116 08/23/2017 0951   BUN 23 (H) 12/14/2018 1317   BUN 11.6 08/23/2017 0951   CREATININE 1.14 (H) 12/14/2018 1317   CREATININE 1.04 (H) 12/12/2018 1056   CREATININE 1.0 08/23/2017 0951   CALCIUM 9.0 12/14/2018 1317   CALCIUM 10.4 08/23/2017 0951   PROT 7.7 12/14/2018 1317   PROT 6.4 08/23/2017 0951   ALBUMIN 3.4 (L) 12/14/2018 1317   ALBUMIN 2.9 (L) 08/23/2017 0951   AST 55 (H) 12/14/2018 1317   AST 48 (H) 12/12/2018 1056   AST 208 (HH) 08/23/2017 0951   ALT 57 (H) 12/14/2018 1317   ALT 53 (H) 12/12/2018 1056   ALT 151 (H) 08/23/2017 6314  ALKPHOS 354 (H) 12/14/2018 1317   ALKPHOS 549  (H) 08/23/2017 0951   BILITOT 1.5 (H) 12/14/2018 1317   BILITOT 0.5 12/12/2018 1056   BILITOT 0.82 08/23/2017 0951   GFRNONAA 54 (L) 12/14/2018 1317   GFRNONAA >60 12/12/2018 1056   GFRAA >60 12/14/2018 1317   GFRAA >60 12/12/2018 1056       Component Value Date/Time   WBC 7.4 12/14/2018 1317   RBC 2.56 (L) 12/14/2018 1317   HGB 8.9 (L) 12/14/2018 1317   HGB 6.6 (LL) 01/12/2018 1130   HGB 11.9 08/23/2017 0951   HCT 26.3 (L) 12/14/2018 1317   HCT 36.0 08/23/2017 0951   PLT 132 (L) 12/14/2018 1317   PLT 31 (L) 01/12/2018 1130   PLT 401 (H) 08/23/2017 0951   MCV 102.7 (H) 12/14/2018 1317   MCV 92.8 08/23/2017 0951   MCH 34.8 (H) 12/14/2018 1317   MCHC 33.8 12/14/2018 1317   RDW 15.1 12/14/2018 1317   RDW 14.2 08/23/2017 0951   LYMPHSABS 0.2 (L) 12/14/2018 1317   LYMPHSABS 0.8 (L) 08/23/2017 0951   MONOABS 0.1 12/14/2018 1317   MONOABS 1.3 (H) 08/23/2017 0951   EOSABS 0.0 12/14/2018 1317   EOSABS 0.1 08/23/2017 0951   BASOSABS 0.0 12/14/2018 1317   BASOSABS 0.0 08/23/2017 0951   -------------------------------  Imaging from last 24 hours (if applicable):  Radiology interpretation: Dg Chest 2 View  Result Date: 12/14/2018 CLINICAL DATA:  Abdominal pain EXAM: CHEST - 2 VIEW COMPARISON:  09/01/2018 PET FINDINGS: Right jugular Port-A-Cath with its tip at the cavoatrial junction is stable. Normal heart size. Clear lungs. No pneumothorax or pleural effusion. IMPRESSION: No active cardiopulmonary disease. Electronically Signed   By: Marybelle Killings M.D.   On: 12/14/2018 16:08   Ct Chest W Contrast  Result Date: 12/14/2018 CLINICAL DATA:  58 year old female with a history of metastatic pancreatic cancer with right upper quadrant pain and fever EXAM: CT CHEST, ABDOMEN, AND PELVIS WITH CONTRAST TECHNIQUE: Multidetector CT imaging of the chest, abdomen and pelvis was performed following the standard protocol during bolus administration of intravenous contrast. CONTRAST:  126mL OMNIPAQUE  IOHEXOL 300 MG/ML  SOLN COMPARISON:  Multiple prior PET CT most recent 09/01/2018, most recent CT abdomen 10/14/2017 FINDINGS: CT CHEST FINDINGS Cardiovascular: Heart size within normal limits. No pericardial fluid/thickening. Unremarkable course caliber contour of the thoracic aorta. Unremarkable diameter of the main pulmonary artery. No proximal filling defects. No significant atherosclerosis. Port catheter on the right chest wall with IJ approach. Mediastinum/Nodes: No adenopathy. Unremarkable appearance of the thoracic esophagus. Unremarkable thoracic inlet. Lungs/Pleura: No pneumothorax or pleural effusion. No confluent airspace disease. Atelectatic changes/scarring bilateral lungs, predominantly in the dependent lung bases, lingula, right middle lobe. Musculoskeletal: No acute displaced fracture. No significant degenerative changes. No bony canal narrowing. CT ABDOMEN PELVIS FINDINGS Hepatobiliary: There are treatment changes within the liver, with multiple regions of necrotic/cystic change, new from the baseline CT of 07/23/2017. Compared to the most recent PET/CT they are has been significant interval decrease in size of all of the lesions previously noted, involving all liver segments. Index lesion in segment 4 B measures 5.8 cm with internal necrosis. Index lesion within segment 5/6 measuring 5.7 cm, with internal necrosis. Index lesion segment 8 measures 3.2 cm with internal necrosis. Hypervascular region on the margin of a treated lesion within segment 5 (image 122 of series 3) likely perfusion anomaly/Thad. Distention of the gallbladder with gallbladder wall thickening and multiple intrahepatic stones. Calcified stone in the hilum of the  liver, likely within the cystic duct. No calcified stone is identified within the distal common bile duct. Pancreas: Unchanged appearance of the pancreas with mild dilation of the pancreatic duct in the body of the pancreas. No inflammatory changes or calcified  parenchyma. Spleen: Unremarkable spleen Adrenals/Urinary Tract: Unremarkable appearance of the adrenal glands. No evidence of hydronephrosis of the right or left kidney. No nephrolithiasis. Unremarkable course of the bilateral ureters. Unremarkable appearance of the urinary bladder. Stomach/Bowel: Unremarkable stomach. Small bowel unremarkable without abnormal distention or focal inflammatory changes. Enteric contrast reaches the terminal ileum. Moderate stool burden. No evidence of obstruction. Vascular/Lymphatic: No significant atherosclerotic changes. Edema within the fat at the liver hilum, surrounding the gallbladder within the right abdomen, with mild edema within the mesenteric fat of the mid abdomen. No adenopathy. Reproductive: Unremarkable uterus. Other: None Musculoskeletal: No acute displaced fracture. IMPRESSION: CT demonstrates acute cholecystitis, with choledocholithiasis of the cystic duct. These results were discussed by telephone at the time of interpretation on 12/14/2018 at 5:05 pm with Dr. Burr Dawson. Redemonstration of known liver metastases, with positive response to therapy, interval reduction in size of all lesions, with multiple demonstrating significant internal necrosis. No acute finding of the chest. Ancillary findings as above. Electronically Signed   By: Corrie Mckusick D.O.   On: 12/14/2018 17:12   Ct Abdomen Pelvis W Contrast  Result Date: 12/14/2018 CLINICAL DATA:  58 year old female with a history of metastatic pancreatic cancer with right upper quadrant pain and fever EXAM: CT CHEST, ABDOMEN, AND PELVIS WITH CONTRAST TECHNIQUE: Multidetector CT imaging of the chest, abdomen and pelvis was performed following the standard protocol during bolus administration of intravenous contrast. CONTRAST:  134mL OMNIPAQUE IOHEXOL 300 MG/ML  SOLN COMPARISON:  Multiple prior PET CT most recent 09/01/2018, most recent CT abdomen 10/14/2017 FINDINGS: CT CHEST FINDINGS Cardiovascular: Heart size within  normal limits. No pericardial fluid/thickening. Unremarkable course caliber contour of the thoracic aorta. Unremarkable diameter of the main pulmonary artery. No proximal filling defects. No significant atherosclerosis. Port catheter on the right chest wall with IJ approach. Mediastinum/Nodes: No adenopathy. Unremarkable appearance of the thoracic esophagus. Unremarkable thoracic inlet. Lungs/Pleura: No pneumothorax or pleural effusion. No confluent airspace disease. Atelectatic changes/scarring bilateral lungs, predominantly in the dependent lung bases, lingula, right middle lobe. Musculoskeletal: No acute displaced fracture. No significant degenerative changes. No bony canal narrowing. CT ABDOMEN PELVIS FINDINGS Hepatobiliary: There are treatment changes within the liver, with multiple regions of necrotic/cystic change, new from the baseline CT of 07/23/2017. Compared to the most recent PET/CT they are has been significant interval decrease in size of all of the lesions previously noted, involving all liver segments. Index lesion in segment 4 B measures 5.8 cm with internal necrosis. Index lesion within segment 5/6 measuring 5.7 cm, with internal necrosis. Index lesion segment 8 measures 3.2 cm with internal necrosis. Hypervascular region on the margin of a treated lesion within segment 5 (image 122 of series 3) likely perfusion anomaly/Thad. Distention of the gallbladder with gallbladder wall thickening and multiple intrahepatic stones. Calcified stone in the hilum of the liver, likely within the cystic duct. No calcified stone is identified within the distal common bile duct. Pancreas: Unchanged appearance of the pancreas with mild dilation of the pancreatic duct in the body of the pancreas. No inflammatory changes or calcified parenchyma. Spleen: Unremarkable spleen Adrenals/Urinary Tract: Unremarkable appearance of the adrenal glands. No evidence of hydronephrosis of the right or left kidney. No  nephrolithiasis. Unremarkable course of the bilateral ureters. Unremarkable appearance of  the urinary bladder. Stomach/Bowel: Unremarkable stomach. Small bowel unremarkable without abnormal distention or focal inflammatory changes. Enteric contrast reaches the terminal ileum. Moderate stool burden. No evidence of obstruction. Vascular/Lymphatic: No significant atherosclerotic changes. Edema within the fat at the liver hilum, surrounding the gallbladder within the right abdomen, with mild edema within the mesenteric fat of the mid abdomen. No adenopathy. Reproductive: Unremarkable uterus. Other: None Musculoskeletal: No acute displaced fracture. IMPRESSION: CT demonstrates acute cholecystitis, with choledocholithiasis of the cystic duct. These results were discussed by telephone at the time of interpretation on 12/14/2018 at 5:05 pm with Dr. Burr Dawson. Redemonstration of known liver metastases, with positive response to therapy, interval reduction in size of all lesions, with multiple demonstrating significant internal necrosis. No acute finding of the chest. Ancillary findings as above. Electronically Signed   By: Corrie Mckusick D.O.   On: 12/14/2018 17:12        This patient was seen with Dr. Burr Dawson with my treatment plan reviewed with her. She expressed agreement with my medical management of this patient.  Addendum  I have seen the patient, examined her. I agree with the assessment and and plan and have edited the notes.   I reviewed her CT scan in person and discussed with radiologist Dr. Earleen Newport. It showed acute cholecystitis with choledocholithiasis, her metastatic cancer in liver has improved. I discussed the findings with pt and will send her to ED for admission. Surgical consult will be obtained. Although she has stage IV incurable pancreatic cancer, she is responding to second line chemotherapy well, her life expectancy is probably more than 6 months.  Surgery should be considered if indicated. If elective  surgery is preferred, I will hold off her chemo before surgery. I will f/u in the hospital.   Morgan Dawson  12/15/2018

## 2018-12-14 NOTE — ED Notes (Signed)
ED TO INPATIENT HANDOFF REPORT  ED Nurse Name and Phone #:  Duard Larsen RN [phone]  S Name/Age/Gender Morgan Dawson 58 y.o. female Room/Bed: WA07/WA07  Code Status   Code Status: Prior  Home/SNF/Other Home Patient oriented to: self, place, time and situation Is this baseline? Yes   Triage Complete: Triage complete  Chief Complaint Abd pain  Triage Note Patient is coming from the cancer center with complaint abdominal pain and fever. Pt reportedly had a CT scan that showed gallbladder enlargement and stones.    Allergies No Known Allergies  Level of Care/Admitting Diagnosis ED Disposition    ED Disposition Condition Comment   Admit  Hospital Area: [facility] [[id number]]  Level of Care: Med-Surg [16]  Covid Evaluation: N/A  Diagnosis: Cholecystitis [703500]  Admitting Physician: Toy Baker [[id number]]  Attending Physician: Toy Baker [[id number]]  Estimated length of stay: past midnight tomorrow  Certification:: I certify this patient will need inpatient services for at least 2 midnights  PT Class (Do Not Modify): Inpatient [101]  PT Acc Code (Do Not Modify): Private [1]       B Medical/Surgery History Past Medical History:  Diagnosis Date  . Diarrhea of presumed infectious origin 01/12/2018  . Gastroenteritis 01/12/2018  . Genetic testing 12/02/2017   Multi-Cancer panel (83 genes) @ [facility] - No pathogenic mutations detected  . Meniere disease 2016  . Pancreatitis    Past Surgical History:  Procedure Laterality Date  . CERVICAL ABLATION    . ENDOMETRIAL ABLATION  2011  . IR FLUORO GUIDE PORT INSERTION RIGHT  08/12/2017  . IR US GUIDE VASC ACCESS RIGHT  08/12/2017  . MANDIBLE SURGERY  1999     A IV Location/Drains/Wounds Patient Lines/Drains/Airways Status   Active Line/Drains/Airways    Name:   Placement date:   Placement time:   Site:   Days:   Implanted Port 08/12/17 Right Chest   08/12/17    1501    Chest   489    Incision (Closed) 08/05/17 Abdomen Right;Upper   08/05/17    0846     496          Intake/Output Last 24 hours No intake or output data in the 24 hours ending 12/14/18 2020  Labs/Imaging Results for orders placed or performed in visit on 12/14/18 (from the past 48 hour(s))  Comprehensive metabolic panel     Status: Abnormal   Collection Time: 12/14/18  1:17 PM  Result Value Ref Range   Sodium 135 135 - 145 mmol/L   Potassium 3.5 3.5 - 5.1 mmol/L   Chloride 99 98 - 111 mmol/L   CO2 27 22 - 32 mmol/L   Glucose, Bld 147 (H) 70 - 99 mg/dL   BUN 23 (H) 6 - 20 mg/dL   Creatinine, Ser 1.14 (H) 0.44 - 1.00 mg/dL   Calcium 9.0 8.9 - 10.3 mg/dL   Total Protein 7.7 6.5 - 8.1 g/dL   Albumin 3.4 (L) 3.5 - 5.0 g/dL   AST 55 (H) 15 - 41 U/L   ALT 57 (H) 0 - 44 U/L   Alkaline Phosphatase 354 (H) 38 - 126 U/L   Total Bilirubin 1.5 (H) 0.3 - 1.2 mg/dL   GFR calc non Af Amer 54 (L) >60 mL/min   GFR calc Af Amer >60 >60 mL/min   Anion gap 9 5 - 15    Comment: Performed at [facility], [address], [place], [place] [address]  CBC with Differential     Status: Abnormal   Collection Time: 12/14/18  1:17 PM  Result Value Ref Range   WBC 7.4 4.0 - 10.5 K/uL   RBC 2.56 (L) 3.87 - 5.11 MIL/uL   Hemoglobin 8.9 (L) 12.0 - 15.0 g/dL   HCT 26.3 (L) 36.0 - 46.0 %   MCV 102.7 (H) 80.0 - 100.0 fL   MCH 34.8 (H) 26.0 - 34.0 pg   MCHC 33.8 30.0 - 36.0 g/dL   RDW 15.1 11.5 - 15.5 %   Platelets 132 (L) 150 - 400 K/uL   nRBC 0.0 0.0 - 0.2 %   Neutrophils Relative % 96 %   Neutro Abs 7.1 1.7 - 7.7 K/uL   Lymphocytes Relative 2 %   Lymphs Abs 0.2 (L) 0.7 - 4.0 K/uL   Monocytes Relative 1 %   Monocytes Absolute 0.1 0.1 - 1.0 K/uL   Eosinophils Relative 0 %   Eosinophils Absolute 0.0 0.0 - 0.5 K/uL   Basophils Relative 0 %   Basophils Absolute 0.0 0.0 - 0.1 K/uL   Immature Granulocytes 1 %   Abs Immature Granulocytes 0.05 0.00 - 0.07 K/uL    Comment: Performed at [facility], [address], [place], [place] [address]  Culture, Blood     Status: None (Preliminary result)   Collection Time: 12/14/18  1:27 PM  Result Value Ref Range   Specimen Description      BLOOD LEFT ARM Performed at [facility], [place] [address] [place], [place] [address]    Special Requests      BOTTLES DRAWN AEROBIC AND ANAEROBIC Blood Culture results may not be optimal due to an excessive volume of blood received in culture bottles Performed at [place] [address] [place], [place] [address]    Culture PENDING    Report Status PENDING   Amylase     Status: None   Collection Time: 12/14/18  1:32 PM  Result Value Ref Range   Amylase 53 28 - 100 U/L    Comment: Performed at [facility], [place] [address], [place], [place] [address]  Lipase, blood     Status: Abnormal   Collection Time: 12/14/18  1:32 PM  Result Value Ref Range   Lipase 65 (H) 11 - 51 U/L    Comment: Performed at [facility], [place] [address] [place], [place] [address]  Culture, Blood     Status: None (Preliminary result)   Collection Time: 12/14/18  1:50 PM  Result Value Ref Range   Specimen Description BLOOD PORTA CATH    Special Requests      BOTTLES DRAWN AEROBIC AND ANAEROBIC Blood Culture adequate volume Performed at [facility], [place] [address] [place], [place] [address]    Culture PENDING    Report Status PENDING   Urinalysis, Complete w Microscopic     Status: Abnormal   Collection Time: 12/14/18  2:38 PM  Result Value Ref Range   Color, Urine AMBER (A) YELLOW    Comment: BIOCHEMICALS MAY BE AFFECTED BY COLOR   APPearance HAZY (A) CLEAR   Specific Gravity, Urine 1.024 1.005 - 1.030   pH 5.0 5.0 - 8.0   Glucose, UA NEGATIVE NEGATIVE mg/dL   Hgb urine dipstick SMALL (A) NEGATIVE   Bilirubin Urine NEGATIVE NEGATIVE   Ketones, ur NEGATIVE NEGATIVE mg/dL   Protein, ur 30 (A) NEGATIVE  mg/dL   Nitrite  NEGATIVE NEGATIVE   Leukocytes,Ua NEGATIVE NEGATIVE   RBC / HPF 0-5 0 - 5 RBC/hpf   WBC, UA 0-5 0 - 5 WBC/hpf   Bacteria, UA NONE SEEN NONE SEEN   Squamous Epithelial / LPF 0-5 0 - 5   Mucus PRESENT    Granular Casts, UA PRESENT     Comment: Performed at [facility], [place] [address] [place], [place] [address]   Dg Chest 2 View  Result Date: 12/14/2018 CLINICAL DATA:  Abdominal pain EXAM: CHEST - 2 VIEW COMPARISON:  09/01/2018 PET FINDINGS: Right jugular Port-A-Cath with its tip at the cavoatrial junction is stable. Normal heart size. Clear lungs. No pneumothorax or pleural effusion. IMPRESSION: No active cardiopulmonary disease. Electronically Signed   By: Marybelle Killings M.D.   On: 12/14/2018 16:08   Ct Chest W Contrast  Addendum Date: 12/14/2018   ADDENDUM REPORT: 12/14/2018 19:17 ADDENDUM: Addendum created for clarification of findings related to the gallbladder. Distended gallbladder with inflammatory changes compatible with acute cholecystitis. The obstructive calcified stone is likely in the infundibulum/proximal cystic duct, not within common bile duct. No calcified stones are visualized within the common bile duct. Findings discussed with Dr. Johney Maine of general surgery. Electronically Signed   By: Corrie Mckusick D.O.   On: 12/14/2018 19:17   Result Date: 12/14/2018 CLINICAL DATA:  58 year old female with a history of metastatic pancreatic cancer with right upper quadrant pain and fever EXAM: CT CHEST, ABDOMEN, AND PELVIS WITH CONTRAST TECHNIQUE: Multidetector CT imaging of the chest, abdomen and pelvis was performed following the standard protocol during bolus administration of intravenous contrast. CONTRAST:  139mL OMNIPAQUE IOHEXOL 300 MG/ML  SOLN COMPARISON:  Multiple prior PET CT most recent 09/01/2018, most recent CT abdomen 10/14/2017 FINDINGS: CT CHEST FINDINGS Cardiovascular: Heart size within normal limits. No pericardial fluid/thickening.  Unremarkable course caliber contour of the thoracic aorta. Unremarkable diameter of the main pulmonary artery. No proximal filling defects. No significant atherosclerosis. Port catheter on the right chest wall with IJ approach. Mediastinum/Nodes: No adenopathy. Unremarkable appearance of the thoracic esophagus. Unremarkable thoracic inlet. Lungs/Pleura: No pneumothorax or pleural effusion. No confluent airspace disease. Atelectatic changes/scarring bilateral lungs, predominantly in the dependent lung bases, lingula, right middle lobe. Musculoskeletal: No acute displaced fracture. No significant degenerative changes. No bony canal narrowing. CT ABDOMEN PELVIS FINDINGS Hepatobiliary: There are treatment changes within the liver, with multiple regions of necrotic/cystic change, new from the baseline CT of 07/23/2017. Compared to the most recent PET/CT they are has been significant interval decrease in size of all of the lesions previously noted, involving all liver segments. Index lesion in segment 4 B measures 5.8 cm with internal necrosis. Index lesion within segment 5/6 measuring 5.7 cm, with internal necrosis. Index lesion segment 8 measures 3.2 cm with internal necrosis. Hypervascular region on the margin of a treated lesion within segment 5 (image 122 of series 3) likely perfusion anomaly/Thad. Distention of the gallbladder with gallbladder wall thickening and multiple intrahepatic stones. Calcified stone in the hilum of the liver, likely within the cystic duct. No calcified stone is identified within the distal common bile duct. Pancreas: Unchanged appearance of the pancreas with mild dilation of the pancreatic duct in the body of the pancreas. No inflammatory changes or calcified parenchyma. Spleen: Unremarkable spleen Adrenals/Urinary Tract: Unremarkable appearance of the adrenal glands. No evidence of hydronephrosis of the right or left kidney. No nephrolithiasis. Unremarkable course of the bilateral ureters.  Unremarkable appearance of the urinary bladder. Stomach/Bowel: Unremarkable stomach. Small  bowel unremarkable without abnormal distention or focal inflammatory changes. Enteric contrast reaches the terminal ileum. Moderate stool burden. No evidence of obstruction. Vascular/Lymphatic: No significant atherosclerotic changes. Edema within the fat at the liver hilum, surrounding the gallbladder within the right abdomen, with mild edema within the mesenteric fat of the mid abdomen. No adenopathy. Reproductive: Unremarkable uterus. Other: None Musculoskeletal: No acute displaced fracture. IMPRESSION: CT demonstrates acute cholecystitis, with choledocholithiasis of the cystic duct. These results were discussed by telephone at the time of interpretation on 12/14/2018 at 5:05 pm with Dr. Burr Medico. Redemonstration of known liver metastases, with positive response to therapy, interval reduction in size of all lesions, with multiple demonstrating significant internal necrosis. No acute finding of the chest. Ancillary findings as above. Electronically Signed: By: Corrie Mckusick D.O. On: 12/14/2018 17:12   Ct Abdomen Pelvis W Contrast  Addendum Date: 12/14/2018   ADDENDUM REPORT: 12/14/2018 19:17 ADDENDUM: Addendum created for clarification of findings related to the gallbladder. Distended gallbladder with inflammatory changes compatible with acute cholecystitis. The obstructive calcified stone is likely in the infundibulum/proximal cystic duct, not within common bile duct. No calcified stones are visualized within the common bile duct. Findings discussed with Dr. Johney Maine of general surgery. Electronically Signed   By: Corrie Mckusick D.O.   On: 12/14/2018 19:17   Result Date: 12/14/2018 CLINICAL DATA:  58 year old female with a history of metastatic pancreatic cancer with right upper quadrant pain and fever EXAM: CT CHEST, ABDOMEN, AND PELVIS WITH CONTRAST TECHNIQUE: Multidetector CT imaging of the chest, abdomen and pelvis was  performed following the standard protocol during bolus administration of intravenous contrast. CONTRAST:  132mL OMNIPAQUE IOHEXOL 300 MG/ML  SOLN COMPARISON:  Multiple prior PET CT most recent 09/01/2018, most recent CT abdomen 10/14/2017 FINDINGS: CT CHEST FINDINGS Cardiovascular: Heart size within normal limits. No pericardial fluid/thickening. Unremarkable course caliber contour of the thoracic aorta. Unremarkable diameter of the main pulmonary artery. No proximal filling defects. No significant atherosclerosis. Port catheter on the right chest wall with IJ approach. Mediastinum/Nodes: No adenopathy. Unremarkable appearance of the thoracic esophagus. Unremarkable thoracic inlet. Lungs/Pleura: No pneumothorax or pleural effusion. No confluent airspace disease. Atelectatic changes/scarring bilateral lungs, predominantly in the dependent lung bases, lingula, right middle lobe. Musculoskeletal: No acute displaced fracture. No significant degenerative changes. No bony canal narrowing. CT ABDOMEN PELVIS FINDINGS Hepatobiliary: There are treatment changes within the liver, with multiple regions of necrotic/cystic change, new from the baseline CT of 07/23/2017. Compared to the most recent PET/CT they are has been significant interval decrease in size of all of the lesions previously noted, involving all liver segments. Index lesion in segment 4 B measures 5.8 cm with internal necrosis. Index lesion within segment 5/6 measuring 5.7 cm, with internal necrosis. Index lesion segment 8 measures 3.2 cm with internal necrosis. Hypervascular region on the margin of a treated lesion within segment 5 (image 122 of series 3) likely perfusion anomaly/Thad. Distention of the gallbladder with gallbladder wall thickening and multiple intrahepatic stones. Calcified stone in the hilum of the liver, likely within the cystic duct. No calcified stone is identified within the distal common bile duct. Pancreas: Unchanged appearance of the  pancreas with mild dilation of the pancreatic duct in the body of the pancreas. No inflammatory changes or calcified parenchyma. Spleen: Unremarkable spleen Adrenals/Urinary Tract: Unremarkable appearance of the adrenal glands. No evidence of hydronephrosis of the right or left kidney. No nephrolithiasis. Unremarkable course of the bilateral ureters. Unremarkable appearance of the urinary bladder. Stomach/Bowel: Unremarkable stomach. Small  bowel unremarkable without abnormal distention or focal inflammatory changes. Enteric contrast reaches the terminal ileum. Moderate stool burden. No evidence of obstruction. Vascular/Lymphatic: No significant atherosclerotic changes. Edema within the fat at the liver hilum, surrounding the gallbladder within the right abdomen, with mild edema within the mesenteric fat of the mid abdomen. No adenopathy. Reproductive: Unremarkable uterus. Other: None Musculoskeletal: No acute displaced fracture. IMPRESSION: CT demonstrates acute cholecystitis, with choledocholithiasis of the cystic duct. These results were discussed by telephone at the time of interpretation on 12/14/2018 at 5:05 pm with Dr. Burr Medico. Redemonstration of known liver metastases, with positive response to therapy, interval reduction in size of all lesions, with multiple demonstrating significant internal necrosis. No acute finding of the chest. Ancillary findings as above. Electronically Signed: By: Corrie Mckusick D.O. On: 12/14/2018 17:12    Pending Labs Unresulted Labs (From admission, onward)    Start     Ordered   12/15/18 0500  Comprehensive metabolic panel  Tomorrow morning,   R    Question:  Specimen collection method  Answer:  IV Team   12/14/18 1907   12/15/18 0500  CBC  Tomorrow morning,   R    Question:  Specimen collection method  Answer:  IV Team   12/14/18 1907   Signed and Held  Magnesium  Tomorrow morning,   R    Comments:  Call MD if <1.5    Signed and Held   Signed and Held  Phosphorus   Tomorrow morning,   R     Signed and Held   Signed and Held  TSH  Once,   R    Comments:  Cancel if already done within 1 month and notify MD    Signed and Held   Signed and Held  Comprehensive metabolic panel  Once,   R    Comments:  Cal MD for K<3.5 or >5.0    Signed and Held   Signed and Held  CBC  Once,   R    Comments:  Call for hg <8.0    Signed and Held          Vitals/Pain Today's Vitals   12/14/18 1900 12/14/18 1915 12/14/18 1930 12/14/18 1945  BP: 116/69 108/71 119/72 95/65  Pulse: 99 94 90 94  Resp: 20 18 18 12   Temp:      TempSrc:      SpO2: 91% 96% 93% 96%    Isolation Precautions No active isolations  Medications Medications  piperacillin-tazobactam (ZOSYN) IVPB 3.375 g (3.375 g Intravenous New Bag/Given 12/14/18 1953)  0.9 %  sodium chloride infusion (500 mLs Intravenous New Bag/Given 12/14/18 1950)  lactated ringers bolus 1,000 mL (has no administration in time range)  ondansetron (ZOFRAN) injection 4 mg (has no administration in time range)    Or  ondansetron (ZOFRAN) 8 mg in sodium chloride 0.9 % 50 mL IVPB (has no administration in time range)  prochlorperazine (COMPAZINE) injection 5-10 mg (has no administration in time range)  lip balm (CARMEX) ointment 1 application (has no administration in time range)  magic mouthwash (has no administration in time range)  guaiFENesin-dextromethorphan (ROBITUSSIN DM) 100-10 MG/5ML syrup 10 mL (has no administration in time range)  hydrocortisone (ANUSOL-HC) 2.5 % rectal cream 1 application (has no administration in time range)  alum & mag hydroxide-simeth (MAALOX/MYLANTA) 200-200-20 MG/5ML suspension 30 mL (has no administration in time range)  hydrocortisone cream 1 % 1 application (has no administration in time range)  menthol-cetylpyridinium (CEPACOL) lozenge 3 mg (  has no administration in time range)  phenol (CHLORASEPTIC) mouth spray 1-2 spray (has no administration in time range)  diphenhydrAMINE  (BENADRYL) injection 12.5-25 mg (has no administration in time range)  HYDROmorphone (DILAUDID) injection 0.5-2 mg (has no administration in time range)  acetaminophen (TYLENOL) tablet 325-650 mg (has no administration in time range)  acetaminophen (TYLENOL) suppository 650 mg (has no administration in time range)  methocarbamol (ROBAXIN) 1,000 mg in dextrose 5 % 50 mL IVPB (has no administration in time range)  gabapentin (NEURONTIN) capsule 100 mg (has no administration in time range)  loratadine (CLARITIN) tablet 10 mg (has no administration in time range)  meclizine (ANTIVERT) tablet 25 mg (has no administration in time range)  traMADol (ULTRAM) tablet 50 mg (has no administration in time range)  piperacillin-tazobactam (ZOSYN) IVPB 3.375 g (has no administration in time range)    Mobility walks Low fall risk   Focused Assessments GI   R Recommendations: See Admitting Provider Note  Report given to:   Additional Notes: N/A

## 2018-12-15 ENCOUNTER — Encounter (HOSPITAL_COMMUNITY): Payer: Self-pay

## 2018-12-15 ENCOUNTER — Other Ambulatory Visit: Payer: Self-pay

## 2018-12-15 ENCOUNTER — Inpatient Hospital Stay (HOSPITAL_COMMUNITY): Payer: 59

## 2018-12-15 ENCOUNTER — Encounter: Payer: Self-pay | Admitting: Medical

## 2018-12-15 DIAGNOSIS — R609 Edema, unspecified: Secondary | ICD-10-CM

## 2018-12-15 DIAGNOSIS — C799 Secondary malignant neoplasm of unspecified site: Secondary | ICD-10-CM

## 2018-12-15 HISTORY — PX: IR PERC CHOLECYSTOSTOMY: IMG2326

## 2018-12-15 LAB — CBC
HCT: 21.3 % — ABNORMAL LOW (ref 36.0–46.0)
HCT: 21.4 % — ABNORMAL LOW (ref 36.0–46.0)
Hemoglobin: 7.1 g/dL — ABNORMAL LOW (ref 12.0–15.0)
Hemoglobin: 7.2 g/dL — ABNORMAL LOW (ref 12.0–15.0)
MCH: 34.6 pg — ABNORMAL HIGH (ref 26.0–34.0)
MCH: 35.3 pg — ABNORMAL HIGH (ref 26.0–34.0)
MCHC: 33.2 g/dL (ref 30.0–36.0)
MCHC: 33.8 g/dL (ref 30.0–36.0)
MCV: 104.4 fL — ABNORMAL HIGH (ref 80.0–100.0)
MCV: 104.4 fL — ABNORMAL HIGH (ref 80.0–100.0)
Platelets: 90 10*3/uL — ABNORMAL LOW (ref 150–400)
Platelets: 93 10*3/uL — ABNORMAL LOW (ref 150–400)
RBC: 2.04 MIL/uL — ABNORMAL LOW (ref 3.87–5.11)
RBC: 2.05 MIL/uL — ABNORMAL LOW (ref 3.87–5.11)
RDW: 14.7 % (ref 11.5–15.5)
RDW: 15 % (ref 11.5–15.5)
WBC: 5.9 10*3/uL (ref 4.0–10.5)
WBC: 5.9 10*3/uL (ref 4.0–10.5)
nRBC: 0 % (ref 0.0–0.2)
nRBC: 0 % (ref 0.0–0.2)

## 2018-12-15 LAB — COMPREHENSIVE METABOLIC PANEL
ALT: 45 U/L — ABNORMAL HIGH (ref 0–44)
ALT: 45 U/L — ABNORMAL HIGH (ref 0–44)
AST: 38 U/L (ref 15–41)
AST: 38 U/L (ref 15–41)
Albumin: 3 g/dL — ABNORMAL LOW (ref 3.5–5.0)
Albumin: 3 g/dL — ABNORMAL LOW (ref 3.5–5.0)
Alkaline Phosphatase: 221 U/L — ABNORMAL HIGH (ref 38–126)
Alkaline Phosphatase: 227 U/L — ABNORMAL HIGH (ref 38–126)
Anion gap: 7 (ref 5–15)
Anion gap: 7 (ref 5–15)
BUN: 22 mg/dL — ABNORMAL HIGH (ref 6–20)
BUN: 22 mg/dL — ABNORMAL HIGH (ref 6–20)
CO2: 28 mmol/L (ref 22–32)
CO2: 28 mmol/L (ref 22–32)
Calcium: 8.6 mg/dL — ABNORMAL LOW (ref 8.9–10.3)
Calcium: 8.6 mg/dL — ABNORMAL LOW (ref 8.9–10.3)
Chloride: 100 mmol/L (ref 98–111)
Chloride: 100 mmol/L (ref 98–111)
Creatinine, Ser: 0.96 mg/dL (ref 0.44–1.00)
Creatinine, Ser: 0.99 mg/dL (ref 0.44–1.00)
GFR calc Af Amer: >60 mL/min (ref >60)
GFR calc Af Amer: >60 mL/min (ref >60)
GFR calc non Af Amer: >60 mL/min (ref >60)
GFR calc non Af Amer: >60 mL/min (ref >60)
Glucose, Bld: 97 mg/dL (ref 70–99)
Glucose, Bld: 98 mg/dL (ref 70–99)
Potassium: 3.2 mmol/L — ABNORMAL LOW (ref 3.5–5.1)
Potassium: 3.3 mmol/L — ABNORMAL LOW (ref 3.5–5.1)
Sodium: 135 mmol/L (ref 135–145)
Sodium: 135 mmol/L (ref 135–145)
Total Bilirubin: 1.8 mg/dL — ABNORMAL HIGH (ref 0.3–1.2)
Total Bilirubin: 1.9 mg/dL — ABNORMAL HIGH (ref 0.3–1.2)
Total Protein: 6.5 g/dL (ref 6.5–8.1)
Total Protein: 6.6 g/dL (ref 6.5–8.1)

## 2018-12-15 LAB — PHOSPHORUS: Phosphorus: 3.6 mg/dL (ref 2.5–4.6)

## 2018-12-15 LAB — TSH: TSH: 13.948 u[IU]/mL — ABNORMAL HIGH (ref 0.350–4.500)

## 2018-12-15 LAB — URINE CULTURE: Culture: <10000 — AB

## 2018-12-15 LAB — MAGNESIUM: Magnesium: 1.7 mg/dL (ref 1.7–2.4)

## 2018-12-15 LAB — HEPARIN LEVEL (UNFRACTIONATED): Heparin Unfractionated: 0.1 IU/mL — ABNORMAL LOW (ref 0.30–0.70)

## 2018-12-15 LAB — ABO/RH: ABO/RH(D): A POS

## 2018-12-15 LAB — PROTIME-INR
INR: 1 (ref 0.8–1.2)
Prothrombin Time: 13.4 seconds (ref 11.4–15.2)

## 2018-12-15 LAB — APTT: aPTT: 46 seconds — ABNORMAL HIGH (ref 24–36)

## 2018-12-15 MED ORDER — MIDAZOLAM HCL 2 MG/2ML IJ SOLN
INTRAMUSCULAR | Status: AC | PRN
  Administered 2018-12-15 (×2): 1 mg via INTRAVENOUS

## 2018-12-15 MED ORDER — FENTANYL CITRATE (PF) 100 MCG/2ML IJ SOLN
INTRAMUSCULAR | Status: AC
  Filled 2018-12-15: qty 2

## 2018-12-15 MED ORDER — LIDOCAINE HCL (PF) 1 % IJ SOLN
INTRAMUSCULAR | Status: AC | PRN
  Administered 2018-12-15: 20 mL

## 2018-12-15 MED ORDER — IOHEXOL 300 MG/ML  SOLN
50.0000 mL | Freq: Once | INTRAMUSCULAR | Status: AC | PRN
  Administered 2018-12-15: 5 mL via INTRAVENOUS

## 2018-12-15 MED ORDER — IOHEXOL 300 MG/ML  SOLN: 50.0000 mL | Freq: Once | INTRAMUSCULAR | Status: DC | PRN

## 2018-12-15 MED ORDER — LIDOCAINE HCL 1 % IJ SOLN
INTRAMUSCULAR | Status: AC
  Filled 2018-12-15: qty 20

## 2018-12-15 MED ORDER — POTASSIUM CHLORIDE CRYS ER 10 MEQ PO TBCR
40.0000 meq | EXTENDED_RELEASE_TABLET | Freq: Once | ORAL | Status: AC
  Administered 2018-12-15: 40 meq via ORAL
  Filled 2018-12-15: qty 4

## 2018-12-15 MED ORDER — SODIUM CHLORIDE 0.9 % IV BOLUS
500.0000 mL | Freq: Once | INTRAVENOUS | Status: AC
  Administered 2018-12-15: 500 mL via INTRAVENOUS

## 2018-12-15 MED ORDER — SODIUM CHLORIDE 0.9% FLUSH
10.0000 mL | Freq: Two times a day (BID) | INTRAVENOUS | Status: DC
  Administered 2018-12-19: 10 mL

## 2018-12-15 MED ORDER — MIDAZOLAM HCL 2 MG/2ML IJ SOLN
INTRAMUSCULAR | Status: AC
  Filled 2018-12-15: qty 4

## 2018-12-15 MED ORDER — SODIUM CHLORIDE 0.9% FLUSH: 10.0000 mL | INTRAVENOUS | Status: DC | PRN

## 2018-12-15 MED ORDER — HEPARIN (PORCINE) 25000 UT/250ML-% IV SOLN
1050.0000 [IU]/h | INTRAVENOUS | Status: DC
  Administered 2018-12-15: 900 [IU]/h via INTRAVENOUS
  Filled 2018-12-15 (×2): qty 250

## 2018-12-15 MED ORDER — FENTANYL CITRATE (PF) 100 MCG/2ML IJ SOLN
INTRAMUSCULAR | Status: AC | PRN
  Administered 2018-12-15 (×2): 50 ug via INTRAVENOUS

## 2018-12-15 MED ORDER — SODIUM CHLORIDE 0.9 % IV SOLN
INTRAVENOUS | Status: AC
  Administered 2018-12-15 (×2): via INTRAVENOUS

## 2018-12-15 NOTE — Procedures (Signed)
Interventional Radiology Procedure:   Indications: Acute cholecystitis with metastatic pancreatic cancer  Procedure: Image guided cholecystostomy tube placement  Findings: Distended, thick wall gallbladder with stones and sludge.  10 Fr drain placed in gallbladder and removed 80 ml of thick bloody bilious fluid.  Complications: None     EBL: Less than 10 ml  Plan: Will flush drain for now due to thick output.  Send fluid for culture.      Mellony Danziger R. Anselm Pancoast, MD  Pager: [phone]

## 2018-12-15 NOTE — Progress Notes (Signed)
These preliminary result these preliminary results were noted.  Awaiting final report.

## 2018-12-15 NOTE — Progress Notes (Signed)
PROGRESS NOTE    Morgan Dawson  GUY:[id number] DOB: Jun 06, 1961 DOA: 12/14/2018 PCP: [name], MD     Brief Narrative:  Morgan Dawson is a 58 year old female with medical history significant for pancreatic cancer with metastases to the liver, currently on chemotherapy.  She presents with 2-day history of abdominal pain, low-grade fevers.  She admits to abdominal pain radiating to her back, worse in the right upper quadrant.  Denies any nausea or diarrhea.  She presented to the oncology office, underwent CT of the abdomen and pelvis which revealed cholecystitis.  She was referred to the emergency department at that time.  Patient admitted for acute cholecystitis, started on IV Zosyn with general surgery consulted.  New events last 24 hours / Subjective: Feeling much better than yesterday.  Continues to have some intermittent right upper quadrant abdominal pain.  Denies any nausea or vomiting.  Some left ankle swelling but no leg pain.  Assessment & Plan:   Principal Problem:   Acute calculous cholecystitis Active Problems:   Pancreatic cancer ([place])   Pancreatic cancer metastasized to liver ([place])   Port-A-Cath in place   CKD (chronic kidney disease), stage III ([place])   Anemia due to antineoplastic chemotherapy   Hypokalemia   Thrombocytopenia ([facility])   Vertigo   Meniere's disease of right ear   Liver metastases - presumed pancreatic cancer primary   Cholelithiasis - calcified gallstones   On antineoplastic chemotherapy   Inguinal hernia, left   Hemangioma of liver, right lobe segment 5   Leg edema, left   Acute calculous cholecystitis General surgery following, IR consulted for percutaneous chole tube Continue IV Zosyn  Pancreatic cancer with mets to the liver Followed by Dr. Burr Medico, oncology  Left lower leg DVT Start IV heparin after IR procedure  CKD stage III Creatinine remains stable  Hypokalemia Replace, trend  Elevated liver enzymes Trending  downward  Chronic macrocytic anemia Continue to trend hemoglobin   DVT prophylaxis: IV heparin ordered Code Status: Full Family Communication: None Disposition Plan: Pending IR evaluation for perc cholecystostomy tube   Consultants:   General surgery  Oncology  IR  Procedures:   None  Antimicrobials:  Anti-infectives (From admission, onward)   Start     Dose/Rate Route Frequency Ordered Stop   12/15/18 0200  piperacillin-tazobactam (ZOSYN) IVPB 3.375 g     3.375 g 12.5 mL/hr over 240 Minutes Intravenous Every 8 hours 12/14/18 1910     12/14/18 1900  piperacillin-tazobactam (ZOSYN) IVPB 3.375 g     3.375 g 100 mL/hr over 30 Minutes Intravenous  Once 12/14/18 1845 12/14/18 2028       Objective: Vitals:   12/14/18 1945 12/14/18 2000 12/14/18 2015 12/14/18 2044  BP: 95/65 (!) 142/88 112/73 (!) 143/83  Pulse: 94 (!) 113 94 (!) 102  Resp: 12 17 18 18   Temp:    98.6 F (37 C)  TempSrc:    Oral  SpO2: 96% 95% 94% 97%  Weight:    47.2 kg  Height:    5\' 5"  (1.651 m)    Intake/Output Summary (Last 24 hours) at 12/15/2018 1017 Last data filed at 12/15/2018 0600 Gross per 24 hour  Intake 80.44 ml  Output --  Net 80.44 ml   Filed Weights   12/14/18 2044  Weight: 47.2 kg    Examination:  General exam: Appears calm and comfortable  Respiratory system: Clear to auscultation. Respiratory effort normal. Cardiovascular system: S1 & S2 heard, RRR. No JVD, murmurs, rubs, gallops  or clicks. No pedal edema. Gastrointestinal system: Abdomen is nondistended, soft and TTP RUQ. No organomegaly or masses felt. Normal bowel sounds heard. Central nervous system: Alert and oriented. No focal neurological deficits. Extremities: Symmetric 5 x 5 power. No edema or erythema of lower extremities, no tenderness to palpation of left calf  Skin: No rashes, lesions or ulcers Psychiatry: Judgement and insight appear normal. Mood & affect appropriate.   Data Reviewed: I have personally  reviewed following labs and imaging studies  CBC: Recent Labs  Lab 12/12/18 1056 12/14/18 1317 12/15/18 0841  WBC 2.0* 7.4 5.9   5.9  NEUTROABS 1.1* 7.1  --   HGB 8.8* 8.9* 7.1*   7.2*  HCT 26.8* 26.3* 21.4*   21.3*  MCV 106.8* 102.7* 104.4*   104.4*  PLT 120* 132* 93*   90*   Basic Metabolic Panel: Recent Labs  Lab 12/12/18 1056 12/14/18 1317 12/15/18 0841  NA 138 135 135   135  K 3.8 3.5 3.2*   3.3*  CL 103 99 100   100  CO2 27 27 28   28   GLUCOSE 110* 147* 98   97  BUN 21* 23* 22*   22*  CREATININE 1.04* 1.14* 0.96   0.99  CALCIUM 9.3 9.0 8.6*   8.6*  MG  --   --  1.7  PHOS  --   --  3.6   GFR: Estimated Creatinine Clearance: 48.8 mL/min (by C-G formula based on SCr of 0.96 mg/dL). Liver Function Tests: Recent Labs  Lab 12/12/18 1056 12/14/18 1317 12/15/18 0841  AST 48* 55* 38   38  ALT 53* 57* 45*   45*  ALKPHOS 317* 354* 221*   227*  BILITOT 0.5 1.5* 1.9*   1.8*  PROT 7.7 7.7 6.5   6.6  ALBUMIN 3.4* 3.4* 3.0*   3.0*   Recent Labs  Lab 12/14/18 1332  LIPASE 65*  AMYLASE 53   No results for input(s): AMMONIA in the last 168 hours. Coagulation Profile: No results for input(s): INR, PROTIME in the last 168 hours. Cardiac Enzymes: No results for input(s): CKTOTAL, CKMB, CKMBINDEX, TROPONINI in the last 168 hours. BNP (last 3 results) No results for input(s): PROBNP in the last 8760 hours. HbA1C: No results for input(s): HGBA1C in the last 72 hours. CBG: No results for input(s): GLUCAP in the last 168 hours. Lipid Profile: No results for input(s): CHOL, HDL, LDLCALC, TRIG, CHOLHDL, LDLDIRECT in the last 72 hours. Thyroid Function Tests: No results for input(s): TSH, T4TOTAL, FREET4, T3FREE, THYROIDAB in the last 72 hours. Anemia Panel: No results for input(s): VITAMINB12, FOLATE, FERRITIN, TIBC, IRON, RETICCTPCT in the last 72 hours. Sepsis Labs: No results for input(s): PROCALCITON, LATICACIDVEN in the last 168 hours.  Recent Results (from the  past 240 hour(s))  Culture, Blood     Status: None (Preliminary result)   Collection Time: 12/14/18  1:27 PM  Result Value Ref Range Status   Specimen Description   Final    BLOOD LEFT ARM Performed at [facility], [address] [place], [place] [address]    Special Requests   Final    BOTTLES DRAWN AEROBIC AND ANAEROBIC Blood Culture results may not be optimal due to an excessive volume of blood received in culture bottles Performed at [facility] [address], [place], [place] [address]    Culture PENDING  Incomplete   Report Status PENDING  Incomplete  Culture, Blood  Status: None (Preliminary result)   Collection Time: 12/14/18  1:50 PM  Result Value Ref Range Status   Specimen Description BLOOD PORTA CATH  Final   Special Requests   Final    BOTTLES DRAWN AEROBIC AND ANAEROBIC Blood Culture adequate volume Performed at [facility], [address] [place], [place] [address]    Culture PENDING  Incomplete   Report Status PENDING  Incomplete       Radiology Studies: Dg Chest 2 View  Result Date: 12/14/2018 CLINICAL DATA:  Abdominal pain EXAM: CHEST - 2 VIEW COMPARISON:  09/01/2018 PET FINDINGS: Right jugular Port-A-Cath with its tip at the cavoatrial junction is stable. Normal heart size. Clear lungs. No pneumothorax or pleural effusion. IMPRESSION: No active cardiopulmonary disease. Electronically Signed   By: Marybelle Killings M.D.   On: 12/14/2018 16:08   Ct Chest W Contrast  Addendum Date: 12/14/2018   ADDENDUM REPORT: 12/14/2018 19:17 ADDENDUM: Addendum created for clarification of findings related to the gallbladder. Distended gallbladder with inflammatory changes compatible with acute cholecystitis. The obstructive calcified stone is likely in the infundibulum/proximal cystic duct, not within common bile duct. No calcified stones are visualized within the common bile duct. Findings discussed with Dr. Johney Maine of general surgery.  Electronically Signed   By: Corrie Mckusick D.O.   On: 12/14/2018 19:17   Result Date: 12/14/2018 CLINICAL DATA:  59 year old female with a history of metastatic pancreatic cancer with right upper quadrant pain and fever EXAM: CT CHEST, ABDOMEN, AND PELVIS WITH CONTRAST TECHNIQUE: Multidetector CT imaging of the chest, abdomen and pelvis was performed following the standard protocol during bolus administration of intravenous contrast. CONTRAST:  169mL OMNIPAQUE IOHEXOL 300 MG/ML  SOLN COMPARISON:  Multiple prior PET CT most recent 09/01/2018, most recent CT abdomen 10/14/2017 FINDINGS: CT CHEST FINDINGS Cardiovascular: Heart size within normal limits. No pericardial fluid/thickening. Unremarkable course caliber contour of the thoracic aorta. Unremarkable diameter of the main pulmonary artery. No proximal filling defects. No significant atherosclerosis. Port catheter on the right chest wall with IJ approach. Mediastinum/Nodes: No adenopathy. Unremarkable appearance of the thoracic esophagus. Unremarkable thoracic inlet. Lungs/Pleura: No pneumothorax or pleural effusion. No confluent airspace disease. Atelectatic changes/scarring bilateral lungs, predominantly in the dependent lung bases, lingula, right middle lobe. Musculoskeletal: No acute displaced fracture. No significant degenerative changes. No bony canal narrowing. CT ABDOMEN PELVIS FINDINGS Hepatobiliary: There are treatment changes within the liver, with multiple regions of necrotic/cystic change, new from the baseline CT of 07/23/2017. Compared to the most recent PET/CT they are has been significant interval decrease in size of all of the lesions previously noted, involving all liver segments. Index lesion in segment 4 B measures 5.8 cm with internal necrosis. Index lesion within segment 5/6 measuring 5.7 cm, with internal necrosis. Index lesion segment 8 measures 3.2 cm with internal necrosis. Hypervascular region on the margin of a treated lesion within  segment 5 (image 122 of series 3) likely perfusion anomaly/Thad. Distention of the gallbladder with gallbladder wall thickening and multiple intrahepatic stones. Calcified stone in the hilum of the liver, likely within the cystic duct. No calcified stone is identified within the distal common bile duct. Pancreas: Unchanged appearance of the pancreas with mild dilation of the pancreatic duct in the body of the pancreas. No inflammatory changes or calcified parenchyma. Spleen: Unremarkable spleen Adrenals/Urinary Tract: Unremarkable appearance of the adrenal glands. No evidence of hydronephrosis of the right or left kidney. No nephrolithiasis. Unremarkable course of the bilateral ureters. Unremarkable appearance of the  urinary bladder. Stomach/Bowel: Unremarkable stomach. Small bowel unremarkable without abnormal distention or focal inflammatory changes. Enteric contrast reaches the terminal ileum. Moderate stool burden. No evidence of obstruction. Vascular/Lymphatic: No significant atherosclerotic changes. Edema within the fat at the liver hilum, surrounding the gallbladder within the right abdomen, with mild edema within the mesenteric fat of the mid abdomen. No adenopathy. Reproductive: Unremarkable uterus. Other: None Musculoskeletal: No acute displaced fracture. IMPRESSION: CT demonstrates acute cholecystitis, with choledocholithiasis of the cystic duct. These results were discussed by telephone at the time of interpretation on 12/14/2018 at 5:05 pm with Dr. Burr Medico. Redemonstration of known liver metastases, with positive response to therapy, interval reduction in size of all lesions, with multiple demonstrating significant internal necrosis. No acute finding of the chest. Ancillary findings as above. Electronically Signed: By: Corrie Mckusick D.O. On: 12/14/2018 17:12   Ct Abdomen Pelvis W Contrast  Addendum Date: 12/14/2018   ADDENDUM REPORT: 12/14/2018 19:17 ADDENDUM: Addendum created for clarification of  findings related to the gallbladder. Distended gallbladder with inflammatory changes compatible with acute cholecystitis. The obstructive calcified stone is likely in the infundibulum/proximal cystic duct, not within common bile duct. No calcified stones are visualized within the common bile duct. Findings discussed with Dr. Johney Maine of general surgery. Electronically Signed   By: Corrie Mckusick D.O.   On: 12/14/2018 19:17   Result Date: 12/14/2018 CLINICAL DATA:  58 year old female with a history of metastatic pancreatic cancer with right upper quadrant pain and fever EXAM: CT CHEST, ABDOMEN, AND PELVIS WITH CONTRAST TECHNIQUE: Multidetector CT imaging of the chest, abdomen and pelvis was performed following the standard protocol during bolus administration of intravenous contrast. CONTRAST:  166mL OMNIPAQUE IOHEXOL 300 MG/ML  SOLN COMPARISON:  Multiple prior PET CT most recent 09/01/2018, most recent CT abdomen 10/14/2017 FINDINGS: CT CHEST FINDINGS Cardiovascular: Heart size within normal limits. No pericardial fluid/thickening. Unremarkable course caliber contour of the thoracic aorta. Unremarkable diameter of the main pulmonary artery. No proximal filling defects. No significant atherosclerosis. Port catheter on the right chest wall with IJ approach. Mediastinum/Nodes: No adenopathy. Unremarkable appearance of the thoracic esophagus. Unremarkable thoracic inlet. Lungs/Pleura: No pneumothorax or pleural effusion. No confluent airspace disease. Atelectatic changes/scarring bilateral lungs, predominantly in the dependent lung bases, lingula, right middle lobe. Musculoskeletal: No acute displaced fracture. No significant degenerative changes. No bony canal narrowing. CT ABDOMEN PELVIS FINDINGS Hepatobiliary: There are treatment changes within the liver, with multiple regions of necrotic/cystic change, new from the baseline CT of 07/23/2017. Compared to the most recent PET/CT they are has been significant interval  decrease in size of all of the lesions previously noted, involving all liver segments. Index lesion in segment 4 B measures 5.8 cm with internal necrosis. Index lesion within segment 5/6 measuring 5.7 cm, with internal necrosis. Index lesion segment 8 measures 3.2 cm with internal necrosis. Hypervascular region on the margin of a treated lesion within segment 5 (image 122 of series 3) likely perfusion anomaly/Thad. Distention of the gallbladder with gallbladder wall thickening and multiple intrahepatic stones. Calcified stone in the hilum of the liver, likely within the cystic duct. No calcified stone is identified within the distal common bile duct. Pancreas: Unchanged appearance of the pancreas with mild dilation of the pancreatic duct in the body of the pancreas. No inflammatory changes or calcified parenchyma. Spleen: Unremarkable spleen Adrenals/Urinary Tract: Unremarkable appearance of the adrenal glands. No evidence of hydronephrosis of the right or left kidney. No nephrolithiasis. Unremarkable course of the bilateral ureters. Unremarkable appearance of the  urinary bladder. Stomach/Bowel: Unremarkable stomach. Small bowel unremarkable without abnormal distention or focal inflammatory changes. Enteric contrast reaches the terminal ileum. Moderate stool burden. No evidence of obstruction. Vascular/Lymphatic: No significant atherosclerotic changes. Edema within the fat at the liver hilum, surrounding the gallbladder within the right abdomen, with mild edema within the mesenteric fat of the mid abdomen. No adenopathy. Reproductive: Unremarkable uterus. Other: None Musculoskeletal: No acute displaced fracture. IMPRESSION: CT demonstrates acute cholecystitis, with choledocholithiasis of the cystic duct. These results were discussed by telephone at the time of interpretation on 12/14/2018 at 5:05 pm with Dr. Burr Medico. Redemonstration of known liver metastases, with positive response to therapy, interval reduction in size  of all lesions, with multiple demonstrating significant internal necrosis. No acute finding of the chest. Ancillary findings as above. Electronically Signed: By: Corrie Mckusick D.O. On: 12/14/2018 17:12   Vas [place] Lower Extremity Venous (dvt)  Result Date: 12/15/2018  Lower Venous Study Indications: Edema. Left greater than right  Risk Factors: Cancer Pancreatic. Performing Technologist: Toma Copier RVS  Examination Guidelines: A complete evaluation includes B-mode imaging, spectral Doppler, color Doppler, and power Doppler as needed of all accessible portions of each vessel. Bilateral testing is considered an integral part of a complete examination. Limited examinations for reoccurring indications may be performed as noted.  +-----+---------------+---------+-----------+----------+-------+  RIGHT Compressibility Phasicity Spontaneity Properties Summary  +-----+---------------+---------+-----------+----------+-------+  CFV   Full            Yes       Yes                             +-----+---------------+---------+-----------+----------+-------+  SFJ   Full                                                      +-----+---------------+---------+-----------+----------+-------+   +---------+---------------+---------+-----------+----------+------------------+  LEFT      Compressibility Phasicity Spontaneity Properties Summary             +---------+---------------+---------+-----------+----------+------------------+  CFV       Full            Yes       Yes                                        +---------+---------------+---------+-----------+----------+------------------+  SFJ       Full                                                                 +---------+---------------+---------+-----------+----------+------------------+  FV Prox   Full            Yes       Yes                                        +---------+---------------+---------+-----------+----------+------------------+  FV Mid    Full                                                                  +---------+---------------+---------+-----------+----------+------------------+  FV Distal Full            Yes       Yes                                        +---------+---------------+---------+-----------+----------+------------------+  PFV       Full            Yes       Yes                                        +---------+---------------+---------+-----------+----------+------------------+  POP       Full            Yes       Yes                                        +---------+---------------+---------+-----------+----------+------------------+  PTV       Partial                                          Acute               +---------+---------------+---------+-----------+----------+------------------+  PERO      Full                                             Difficult to image  +---------+---------------+---------+-----------+----------+------------------+   Left Technical Findings: Acute DVT noted in one of the paired posterior tibial veins in the distal calf   Summary: Right: There is no evidence of a common femoral vein obstruction. Left: Findings consistent with acute deep vein thrombosis involving the left posterior tibial vein. See technical findings listed above  *See table(s) above for measurements and observations.    Preliminary       Scheduled Meds:  gabapentin  100 mg Oral BID   lip balm  1 application Topical BID   potassium chloride  40 mEq Oral Once   sodium chloride flush  10-40 mL Intracatheter Q12H   Continuous Infusions:  sodium chloride Stopped (12/14/18 2028)   sodium chloride 75 mL/hr at 12/15/18 0949   lactated ringers     methocarbamol (ROBAXIN) IV     ondansetron (ZOFRAN) IV     piperacillin-tazobactam (ZOSYN)  IV 3.375 g (12/15/18 0946)     LOS: 1 day    Time spent: 40 minutes   Dessa Phi, DO [facility] [email or web address] 12/15/2018, 10:17 AM

## 2018-12-15 NOTE — Progress Notes (Signed)
Subjective/Chief Complaint: Still with some ruq pain, otherwise well   Objective: Vital signs in last 24 hours: Temp:  [98.6 F (37 C)-102.5 F (39.2 C)] 98.6 F (37 C) (04/22 2044) Pulse Rate:  [90-113] 102 (04/22 2044) Resp:  [12-25] 18 (04/22 2044) BP: (95-143)/(65-88) 143/83 (04/22 2044) SpO2:  [91 %-100 %] 97 % (04/22 2044) Weight:  [47.2 kg] 47.2 kg (04/22 2044) Last BM Date: 12/14/18  Intake/Output from previous day: 04/22 0701 - 04/23 0700 In: 80.4 [P.O.:30; I.V.:1.2; IV Piggyback:49.3] Out: -  Intake/Output this shift: No intake/output data recorded.  Abdomen: soft, bs present, tender ruq with murphys sign   Lab Results:  Recent Labs    12/12/18 1056 12/14/18 1317  WBC 2.0* 7.4  HGB 8.8* 8.9*  HCT 26.8* 26.3*  PLT 120* 132*   BMET Recent Labs    12/12/18 1056 12/14/18 1317  NA 138 135  K 3.8 3.5  CL 103 99  CO2 27 27  GLUCOSE 110* 147*  BUN 21* 23*  CREATININE 1.04* 1.14*  CALCIUM 9.3 9.0   PT/INR No results for input(s): LABPROT, INR in the last 72 hours. ABG No results for input(s): PHART, HCO3 in the last 72 hours.  Invalid input(s): PCO2, PO2  Studies/Results: Dg Chest 2 View  Result Date: 12/14/2018 CLINICAL DATA:  Abdominal pain EXAM: CHEST - 2 VIEW COMPARISON:  09/01/2018 PET FINDINGS: Right jugular Port-A-Cath with its tip at the cavoatrial junction is stable. Normal heart size. Clear lungs. No pneumothorax or pleural effusion. IMPRESSION: No active cardiopulmonary disease. Electronically Signed   By: Marybelle Killings M.D.   On: 12/14/2018 16:08   Ct Chest W Contrast  Addendum Date: 12/14/2018   ADDENDUM REPORT: 12/14/2018 19:17 ADDENDUM: Addendum created for clarification of findings related to the gallbladder. Distended gallbladder with inflammatory changes compatible with acute cholecystitis. The obstructive calcified stone is likely in the infundibulum/proximal cystic duct, not within common bile duct. No calcified stones are  visualized within the common bile duct. Findings discussed with Dr. Johney Maine of general surgery. Electronically Signed   By: Corrie Mckusick D.O.   On: 12/14/2018 19:17   Result Date: 12/14/2018 CLINICAL DATA:  58 year old female with a history of metastatic pancreatic cancer with right upper quadrant pain and fever EXAM: CT CHEST, ABDOMEN, AND PELVIS WITH CONTRAST TECHNIQUE: Multidetector CT imaging of the chest, abdomen and pelvis was performed following the standard protocol during bolus administration of intravenous contrast. CONTRAST:  194mL OMNIPAQUE IOHEXOL 300 MG/ML  SOLN COMPARISON:  Multiple prior PET CT most recent 09/01/2018, most recent CT abdomen 10/14/2017 FINDINGS: CT CHEST FINDINGS Cardiovascular: Heart size within normal limits. No pericardial fluid/thickening. Unremarkable course caliber contour of the thoracic aorta. Unremarkable diameter of the main pulmonary artery. No proximal filling defects. No significant atherosclerosis. Port catheter on the right chest wall with IJ approach. Mediastinum/Nodes: No adenopathy. Unremarkable appearance of the thoracic esophagus. Unremarkable thoracic inlet. Lungs/Pleura: No pneumothorax or pleural effusion. No confluent airspace disease. Atelectatic changes/scarring bilateral lungs, predominantly in the dependent lung bases, lingula, right middle lobe. Musculoskeletal: No acute displaced fracture. No significant degenerative changes. No bony canal narrowing. CT ABDOMEN PELVIS FINDINGS Hepatobiliary: There are treatment changes within the liver, with multiple regions of necrotic/cystic change, new from the baseline CT of 07/23/2017. Compared to the most recent PET/CT they are has been significant interval decrease in size of all of the lesions previously noted, involving all liver segments. Index lesion in segment 4 B measures 5.8 cm with internal necrosis. Index  lesion within segment 5/6 measuring 5.7 cm, with internal necrosis. Index lesion segment 8 measures  3.2 cm with internal necrosis. Hypervascular region on the margin of a treated lesion within segment 5 (image 122 of series 3) likely perfusion anomaly/Thad. Distention of the gallbladder with gallbladder wall thickening and multiple intrahepatic stones. Calcified stone in the hilum of the liver, likely within the cystic duct. No calcified stone is identified within the distal common bile duct. Pancreas: Unchanged appearance of the pancreas with mild dilation of the pancreatic duct in the body of the pancreas. No inflammatory changes or calcified parenchyma. Spleen: Unremarkable spleen Adrenals/Urinary Tract: Unremarkable appearance of the adrenal glands. No evidence of hydronephrosis of the right or left kidney. No nephrolithiasis. Unremarkable course of the bilateral ureters. Unremarkable appearance of the urinary bladder. Stomach/Bowel: Unremarkable stomach. Small bowel unremarkable without abnormal distention or focal inflammatory changes. Enteric contrast reaches the terminal ileum. Moderate stool burden. No evidence of obstruction. Vascular/Lymphatic: No significant atherosclerotic changes. Edema within the fat at the liver hilum, surrounding the gallbladder within the right abdomen, with mild edema within the mesenteric fat of the mid abdomen. No adenopathy. Reproductive: Unremarkable uterus. Other: None Musculoskeletal: No acute displaced fracture. IMPRESSION: CT demonstrates acute cholecystitis, with choledocholithiasis of the cystic duct. These results were discussed by telephone at the time of interpretation on 12/14/2018 at 5:05 pm with Dr. Burr Medico. Redemonstration of known liver metastases, with positive response to therapy, interval reduction in size of all lesions, with multiple demonstrating significant internal necrosis. No acute finding of the chest. Ancillary findings as above. Electronically Signed: By: Corrie Mckusick D.O. On: 12/14/2018 17:12   Ct Abdomen Pelvis W Contrast  Addendum Date:  12/14/2018   ADDENDUM REPORT: 12/14/2018 19:17 ADDENDUM: Addendum created for clarification of findings related to the gallbladder. Distended gallbladder with inflammatory changes compatible with acute cholecystitis. The obstructive calcified stone is likely in the infundibulum/proximal cystic duct, not within common bile duct. No calcified stones are visualized within the common bile duct. Findings discussed with Dr. Johney Maine of general surgery. Electronically Signed   By: Corrie Mckusick D.O.   On: 12/14/2018 19:17   Result Date: 12/14/2018 CLINICAL DATA:  58 year old female with a history of metastatic pancreatic cancer with right upper quadrant pain and fever EXAM: CT CHEST, ABDOMEN, AND PELVIS WITH CONTRAST TECHNIQUE: Multidetector CT imaging of the chest, abdomen and pelvis was performed following the standard protocol during bolus administration of intravenous contrast. CONTRAST:  187mL OMNIPAQUE IOHEXOL 300 MG/ML  SOLN COMPARISON:  Multiple prior PET CT most recent 09/01/2018, most recent CT abdomen 10/14/2017 FINDINGS: CT CHEST FINDINGS Cardiovascular: Heart size within normal limits. No pericardial fluid/thickening. Unremarkable course caliber contour of the thoracic aorta. Unremarkable diameter of the main pulmonary artery. No proximal filling defects. No significant atherosclerosis. Port catheter on the right chest wall with IJ approach. Mediastinum/Nodes: No adenopathy. Unremarkable appearance of the thoracic esophagus. Unremarkable thoracic inlet. Lungs/Pleura: No pneumothorax or pleural effusion. No confluent airspace disease. Atelectatic changes/scarring bilateral lungs, predominantly in the dependent lung bases, lingula, right middle lobe. Musculoskeletal: No acute displaced fracture. No significant degenerative changes. No bony canal narrowing. CT ABDOMEN PELVIS FINDINGS Hepatobiliary: There are treatment changes within the liver, with multiple regions of necrotic/cystic change, new from the baseline  CT of 07/23/2017. Compared to the most recent PET/CT they are has been significant interval decrease in size of all of the lesions previously noted, involving all liver segments. Index lesion in segment 4 B measures 5.8 cm with internal necrosis.  Index lesion within segment 5/6 measuring 5.7 cm, with internal necrosis. Index lesion segment 8 measures 3.2 cm with internal necrosis. Hypervascular region on the margin of a treated lesion within segment 5 (image 122 of series 3) likely perfusion anomaly/Thad. Distention of the gallbladder with gallbladder wall thickening and multiple intrahepatic stones. Calcified stone in the hilum of the liver, likely within the cystic duct. No calcified stone is identified within the distal common bile duct. Pancreas: Unchanged appearance of the pancreas with mild dilation of the pancreatic duct in the body of the pancreas. No inflammatory changes or calcified parenchyma. Spleen: Unremarkable spleen Adrenals/Urinary Tract: Unremarkable appearance of the adrenal glands. No evidence of hydronephrosis of the right or left kidney. No nephrolithiasis. Unremarkable course of the bilateral ureters. Unremarkable appearance of the urinary bladder. Stomach/Bowel: Unremarkable stomach. Small bowel unremarkable without abnormal distention or focal inflammatory changes. Enteric contrast reaches the terminal ileum. Moderate stool burden. No evidence of obstruction. Vascular/Lymphatic: No significant atherosclerotic changes. Edema within the fat at the liver hilum, surrounding the gallbladder within the right abdomen, with mild edema within the mesenteric fat of the mid abdomen. No adenopathy. Reproductive: Unremarkable uterus. Other: None Musculoskeletal: No acute displaced fracture. IMPRESSION: CT demonstrates acute cholecystitis, with choledocholithiasis of the cystic duct. These results were discussed by telephone at the time of interpretation on 12/14/2018 at 5:05 pm with Dr. Burr Medico.  Redemonstration of known liver metastases, with positive response to therapy, interval reduction in size of all lesions, with multiple demonstrating significant internal necrosis. No acute finding of the chest. Ancillary findings as above. Electronically Signed: By: Corrie Mckusick D.O. On: 12/14/2018 17:12    Anti-infectives: Anti-infectives (From admission, onward)   Start     Dose/Rate Route Frequency Ordered Stop   12/15/18 0200  piperacillin-tazobactam (ZOSYN) IVPB 3.375 g     3.375 g 12.5 mL/hr over 240 Minutes Intravenous Every 8 hours 12/14/18 1910     12/14/18 1900  piperacillin-tazobactam (ZOSYN) IVPB 3.375 g     3.375 g 100 mL/hr over 30 Minutes Intravenous  Once 12/14/18 1845 12/14/18 2028      Assessment/Plan: Acute calculous cholecystitis Stage IV pancreatic ca -discussed with Dr Burr Medico, she is responding to second line chemo. Goal would be to treat cholecystitis and have her continue treatment -I think best plan would be perc chole and avoid surgery. Would plan reassess in six weeks. If her cystic duct is patent then without sx could remove tube and accept recurrence risk to keep her doing chemo.  If not patent could either leave drain or consider surgery but goal would be to keep her out of or.    Rolm Bookbinder 12/15/2018

## 2018-12-15 NOTE — Progress Notes (Addendum)
HEMATOLOGY-ONCOLOGY PROGRESS NOTE  SUBJECTIVE: The patient still has RUQ pain with movement. At rest, she has no pain. Has been afebrile since admission. Had 2 episodes of vomiting yesterday. Thinks this may be related to vertigo though. Has mild swelling in her left ankle. None in the right. U/S this am shows acute DVT in the left posterior tibial vein. No other complaints this am.   Oncology History   Cancer Staging Pancreatic cancer [facility]) Staging form: Exocrine Pancreas, AJCC 8th Edition - Clinical stage from 08/06/2017: Stage IV (cTX, cN0, pM1) - Signed by [name], MD on 08/12/2017       Pancreatic cancer ([place])   07/23/2017 Imaging    US abdomen limited RUQ 07/23/17 IMPRESSION: 1. Cholelithiasis.  No secondary signs of acute cholecystitis. 2. Multiple solid liver masses measuring up to 6.5 cm in the left lobe of the liver, evaluation for metastatic disease is recommended. These results will be called to the ordering clinician or representative by the Radiologist Assistant, and communication documented in the PACS or zVision Dashboard.    07/23/2017 Imaging    CT Abdomen W Contrast 07/23/17 IMPRESSION: 1. Widespread metastatic disease throughout the liver. No clear primary malignancy identified in the abdomen. The pelvis was not imaged. Tissue sampling recommended. 2. Probable adenopathy superior to the pancreatic tail. No evidence of pancreatic mass. 3. Suspected incidental hemangioma inferiorly in the right hepatic lobe. 4. Nonspecific nodularity in the breasts. The patient has undergone recent (03/25/2017 and 04/01/2017) mammography and ultrasound.    07/29/2017 Initial Diagnosis    Metastasis to liver of unknown origin ([place])    07/29/2017 PET scan    PET 07/29/17  IMPRESSION: 1. Numerous bulky liver masses are hypermetabolic compatible with malignancy. 2. Accentuated activity within or along the pancreatic tail, likely represent a primary pancreatic tumor.  Consider pancreatic protocol MRI to further work this up. 3. The peripancreatic lymph node shown above the pancreatic tail is mildly hypermetabolic favoring malignancy. 4.  Prominent stool throughout the colon favors constipation. 5. 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    08/05/2017 Pathology Results    Liver Biopsy  Diagnosis 08/05/17 Liver, needle/core biopsy - CARCINOMA. - SEE COMMENT. Microscopic Comment The malignant cells are positive for cytokeratin 7. They are negative for arginase, CDX2, cytokeratin 20, estrogen receptor, GATA-3, GCDFP, Glypican 3, Hep Par 1, Napsin A, and TTF-1. This immunohistochemical is nonspecific. Possibly primary sources include pancreatobiliary and upper gastrointestinal. Radiologic correlation is necessary. Of note, organ specific markers (GATA-3, GCDFP-breast, TTF-1, Napsin A-lung, and CDX2-colon) are negative. (JBK:ecj 08/09/2017)    08/21/2017 - 02/10/2018 Chemotherapy    FOLFIRINOX every 2 weeks starting 08/21/17. Dose reduced due to neuropahty and cytopenia     10/14/2017 Imaging    CT CAP WO Contrast 10/14/17 IMPRESSION: Evidence of known numerous liver metastases without significant interval change. No other evidence of metastatic disease within the chest, abdomen or pelvis. Cholelithiasis. Tiny pericardial effusion.    12/02/2017 Genetic Testing    Negative for pathogenic mutation.  The genes analyzed were the 83 genes on [facility]'s Multi-Cancer panel (ALK, APC, ATM, AXIN2, BAP1, BARD1, BLM, BMPR1A, BRCA1, BRCA2, BRIP1, CASR, CDC73, CDH1, CDK4, CDKN1B, CDKN1C, CDKN2A, CEBPA, CHEK2, CTNNA1, DICER1, DIS3L2, EGFR, EPCAM, FH, FLCN, GATA2, GPC3, GREM1, HOXB13, HRAS, KIT, MAX, MEN1, MET, MITF, MLH1, MSH2, MSH3, MSH6, MUTYH, NBN, NF1, NF2, NTHL1, PALB2, PDGFRA, PHOX2B, PMS2, POLD1, POLE, POT1, PRKAR1A, PTCH1, PTEN, RAD50, RAD51C, RAD51D, RB1, RECQL4, RET, RUNX1, SDHA, SDHAF2, SDHB, SDHC, SDHD, SMAD4, SMARCA4, SMARCB1, SMARCE1, STK11, SUFU,  TERC,  TERT, TMEM127, TP53, TSC1, TSC2, VHL, WRN, WT1).    12/17/2017 PET scan    IMPRESSION: 1. Although the liver metastases are still visible on the CT images, they are significantly smaller and retain no abnormal metabolic activity, consistent with response to therapy. 2. No abnormal activity within the pancreas. 3. No disease progression identified. 4. Cholelithiasis.    02/21/2018 PET scan    02/21/2018 PET Scan  IMPRESSION: 1. There are 2 new small nodules identified within the right lung which measure up to 5 mm. These are too small to reliably characterize by PET-CT, but warrant close interval follow-up. 2. Again noted are multifocal liver metastasis. The target lesion within the left lobe is slightly decreased in size when compared with the previous exam. Similar to previous exam there is no abnormal hypermetabolism above background liver activity identified within the liver lesions. 3. Gallstones.    02/22/2018 - 08/25/2018 Chemotherapy    5-FU pump infusion and liposomal irinotecan, every 2 weeks.  First cycle dose reduced due to cytopenia in the travel. Stopped on 08/25/2018 due to disease progression.     05/17/2018 Imaging    05/17/2018 PET Scan IMPRESSION: 1. Mixed response. The 2 small right lung nodules have decreased in size in the interval. Single focus of increased uptake within the liver is new from 02/21/2018 and concerning for recurrent metabolically active liver metastasis. 2. Splenomegaly.  New from previous exam. 3. Diffusely increased bone marrow activity, likely reflecting treatment related changes.    09/01/2018 PET scan    PET 09/01/18 IMPRESSION: 1. Unfortunately there recurrence of hepatic metastasis with multiple new hypermetabolic lesions in LEFT and RIGHT hepatic lobe. 2. New extra hepatic site of malignancy which appears associated the mid pancreas. Difficult to define lesion on noncontrast exam. 3. Diffuse marrow activity is favored benign. 4. No evidence  of pulmonary metastasis.    09/12/2018 -  Chemotherapy    second-line Gemcitabine and Abraxane for 2 weeks on, 1 week off starting 09/12/2018. Due to neuropathy, I will start her with dose reduced Abraxane.      Pancreatic cancer metastasized to liver ([place])   08/11/2017 Initial Diagnosis    Pancreatic cancer metastasized to liver ([place])    09/12/2018 -  Chemotherapy    second-line Gemcitabine and Abraxane for 2 weeks on, 1 week off starting 09/12/2018. Due to neuropathy, I will start her with dose reduced Abraxane.      REVIEW OF SYSTEMS:   Constitutional: Afebrile since admission. Currently NPO so not eating.  Eyes: Denies blurriness of vision Ears, nose, mouth, throat, and face: Denies mucositis or sore throat Respiratory: Denies cough, dyspnea or wheezes Cardiovascular: Denies palpitation, chest discomfort Gastrointestinal:  Had vomiting yesterday. Thinks this is due to vertigo.  Skin: Denies abnormal skin rashes Lymphatics: Denies new lymphadenopathy or easy bruising Neurological:Denies numbness, tingling or new weaknesses Behavioral/Psych: Mood is stable, no new changes  Extremities: reports edema left ankle. No edema on the right.  All other systems were reviewed with the patient and are negative.  I have reviewed the past medical history, past surgical history, social history and family history with the patient and they are unchanged from previous note.   PHYSICAL EXAMINATION:  Vitals:   12/14/18 2015 12/14/18 2044  BP: 112/73 (!) 143/83  Pulse: 94 (!) 102  Resp: 18 18  Temp:  98.6 F (37 C)  SpO2: 94% 97%   Filed Weights   12/14/18 2044  Weight: 104 lb 0.9 oz (47.2 kg)  GENERAL:alert, no distress and comfortable SKIN: skin color, texture, turgor are normal, no rashes or significant lesions EYES: normal, Conjunctiva are pink and non-injected, sclera clear OROPHARYNX:no exudate, no erythema and lips, buccal mucosa, and tongue normal  NECK: supple, thyroid normal  size, non-tender, without nodularity LYMPH:  no palpable lymphadenopathy in the cervical, axillary or inguinal LUNGS: clear to auscultation and percussion with normal breathing effort HEART: regular rate & rhythm and no murmurs. Trace edema left ankle. None on the right.  ABDOMEN: normal bowel sounds. Tender ruq. Musculoskeletal:no cyanosis of digits and no clubbing  NEURO: alert & oriented x 3 with fluent speech, no focal motor/sensory deficits  LABORATORY DATA:  I have reviewed the data as listed CMP Latest Ref Rng & Units 12/14/2018 12/12/2018 12/05/2018  Glucose 70 - 99 mg/dL 147(H) 110(H) 110(H)  BUN 6 - 20 mg/dL 23(H) 21(H) 23(H)  Creatinine 0.44 - 1.00 mg/dL 1.14(H) 1.04(H) 1.08(H)  Sodium 135 - 145 mmol/L 135 138 140  Potassium 3.5 - 5.1 mmol/L 3.5 3.8 3.8  Chloride 98 - 111 mmol/L 99 103 105  CO2 22 - 32 mmol/L 27 27 27   Calcium 8.9 - 10.3 mg/dL 9.0 9.3 9.5  Total Protein 6.5 - 8.1 g/dL 7.7 7.7 7.7  Total Bilirubin 0.3 - 1.2 mg/dL 1.5(H) 0.5 0.5  Alkaline Phos 38 - 126 U/L 354(H) 317(H) 204(H)  AST 15 - 41 U/L 55(H) 48(H) 56(H)  ALT 0 - 44 U/L 57(H) 53(H) 58(H)    Lab Results  Component Value Date   WBC 7.4 12/14/2018   HGB 8.9 (L) 12/14/2018   HCT 26.3 (L) 12/14/2018   MCV 102.7 (H) 12/14/2018   PLT 132 (L) 12/14/2018   NEUTROABS 7.1 12/14/2018    ASSESSMENT AND PLAN:  1. Acute cholecystitis 2. Stage IV pancreatic ca 3. Acute DVT L posterior tibial vein 4. Anemia due to recent chemotherapy 5. Thrombocytopenia due to recent chemotherapy 6. Elevated LFTs 7. Hypokalemia  -General Surgery has seen the patient and recommends percutaneous chole drain. IR has been consulted. Surgery may be considered if drain not effective. Continue IV Zosyn. -CT scan shows that her metastatic lesions in the liver have improved. Although she has stage IV incurable pancreatic cancer, she is responding to second line chemotherapy well, her life expectancy is probably more than 6 months.   Surgery should be considered if indicated at some point in the future. If elective surgery is preferred, we can hold off her chemo before surgery. -Discussed new DVT with hospitalist. Plans to start patient on heparin after IR procedure.  -Recommend PRBC transfusion for Hgb <7.0 or active bleeding. Platelet transfusion for platelet count <20,000 or active bleeding.  -LFTs trending down. Monitor.  -Replete K+ per hospitalist.  Mikey Bussing, DNP, AGPCNP-BC, AOCNP  Addendum  I have seen the patient, examined her. I agree with the assessment and and plan and have edited the notes.   I saw pt in the later afternoon, she has had cholecystostomy tube placement by IR Dr. Anselm Pancoast this afternoon, and tolerated the procedure well.  She is afebrile today, on antibiotics. I discussed her case with surgeon Dr. Donne Hazel this morning, and agree with antibiotics and drainage, holding surgery for now, because surgery will likely postpone her chemo for a while. Pt is in complete agreement with this plan. I will f/u as needed when she is in house. I appreciate all the help from hospitalist team, surgery and IR.   [name]  12/15/2018

## 2018-12-15 NOTE — Progress Notes (Signed)
ANTICOAGULATION CONSULT NOTE - Initial Consult  Pharmacy Consult for IV heparin Indication: New DVT  No Known Allergies  Patient Measurements: Height: 5\' 5"  (165.1 cm) Weight: 104 lb 0.9 oz (47.2 kg) IBW/kg (Calculated) : 57 Heparin Dosing Weight: 47  Vital Signs: Temp: 98.8 F (37.1 C) (04/23 1508) Temp Source: Oral (04/23 1508) BP: 91/58 (04/23 1523) Pulse Rate: 75 (04/23 1508)  Labs: Recent Labs    12/14/18 1317 12/15/18 0841 12/15/18 0941  HGB 8.9* 7.1*  7.2*  --   HCT 26.3* 21.4*  21.3*  --   PLT 132* 93*  90*  --   APTT  --   --  46*  LABPROT  --   --  13.4  INR  --   --  1.0  CREATININE 1.14* 0.96  0.99  --     Estimated Creatinine Clearance: 48.8 mL/min (by C-G formula based on SCr of 0.96 mg/dL).   Medical History: Past Medical History:  Diagnosis Date  . Diarrhea of presumed infectious origin 01/12/2018  . Gastroenteritis 01/12/2018  . Genetic testing 12/02/2017   Multi-Cancer panel (83 genes) @ [facility] - No pathogenic mutations detected  . Meniere disease 2016  . Pancreatitis    Assessment: 58 yo female with metastatic pancreatic cancer that just got chemo on 4/20 now to start IV heparin after an IR guided cholecystostomy tube placement earlier for a new DVT. Per Dr. Anselm Pancoast, it is safe to start IV heparin 2 hours after procedure was completed which was around 1500. Baseline labs low likely from chemo with Hgb of 7.1 and Plt of 90 - monitor closely  Goal of Therapy:  Heparin level 0.3-0.7   Plan:  1) No IV heparin bolus 2) Start IV heparin at rate of 900 units/hr  3) Check heparin level 6 hours after start of IV heparin drip 4) Daily CBC  Kara Mead 12/15/2018,4:19 PM

## 2018-12-15 NOTE — Progress Notes (Signed)
Referring Physician(s): Jeanmarie Hubert  Supervising Physician: Markus Daft  Patient Status:  [facility] - In-pt  Chief Complaint:  Abdominal pain, cholecystitis, metastatic pancreatic cancer  Subjective: Patient familiar to IR service from prior right hepatic lobe lesion biopsy in 2018 and Port-A-Cath placement in 2018.  She has a history of metastatic pancreatic cancer and was recently admitted to [facility] with right upper quadrant abdominal pain, low-grade temp elevation, mild left leg swelling. Subsequent imaging revealed distended gallbladder with inflammatory changes compatible with acute cholecystitis.  There was an obstructive calcified stone likely in the infundibulum/proximal cystic duct.  Also present were known liver metastases.  Left lower extremity venous Doppler study performed today revealed findings consistent with acute DVT involving the left posterior tibial vein.  IV heparin treatment pending. Request now received from surgery for percutaneous cholecystostomy.    Past Medical History:  Diagnosis Date   Diarrhea of presumed infectious origin 01/12/2018   Gastroenteritis 01/12/2018   Genetic testing 12/02/2017   Multi-Cancer panel (83 genes) @ [facility] - No pathogenic mutations detected   Meniere disease 2016   Pancreatitis    Past Surgical History:  Procedure Laterality Date   CERVICAL ABLATION     ENDOMETRIAL ABLATION  2011   IR FLUORO GUIDE PORT INSERTION RIGHT  08/12/2017   IR US GUIDE VASC ACCESS RIGHT  08/12/2017   MANDIBLE SURGERY  1999      Allergies: Patient has no known allergies.  Medications: Prior to Admission medications   Medication Sig Start Date End Date Taking? Authorizing Provider  b complex vitamins tablet Take 1 tablet by mouth daily.   Yes [provider]  gabapentin (NEURONTIN) 100 MG capsule Take 1 capsule (100 mg total) by mouth 3 (three) times daily. Patient taking differently: Take 100 mg by mouth 2 (two)  times daily.  06/03/18  Yes [name], MD  potassium chloride (K-DUR) 10 MEQ tablet Take 1 tablet (10 mEq total) by mouth 4 (four) times daily. 06/08/18  Yes [name], MD  traMADol (ULTRAM) 50 MG tablet Take 1 tablet (50 mg total) by mouth every 6 (six) hours as needed. Patient taking differently: Take 50 mg by mouth every 6 (six) hours as needed for moderate pain or severe pain.  08/22/18  Yes [name], MD  loperamide (IMODIUM) 2 MG capsule Take 1 capsule (2 mg total) by mouth as needed for diarrhea or loose stools. 01/15/18   Lavina Hamman, MD  loratadine (CLARITIN) 10 MG tablet Take 10 mg by mouth daily as needed for allergies.    [provider]  magic mouthwash w/lidocaine SOLN Take 10 mLs by mouth 3 (three) times daily as needed for mouth pain. Swish and spit 10 ML by mouth 3 times daily as needed for mouth pain. 08/31/17   Alla Feeling, NP  meclizine (ANTIVERT) 25 MG tablet Take 25 mg by mouth 3 (three) times daily as needed for dizziness.    [provider]  ondansetron (ZOFRAN-ODT) 8 MG disintegrating tablet TAKE 1 TABLET BY MOUTH EVERY 8 HOURS AS NEEDED FOR NAUSE OR VOMITING Patient taking differently: Take 8 mg by mouth every 8 (eight) hours as needed for nausea or vomiting.  01/25/18   Alla Feeling, NP  prochlorperazine (COMPAZINE) 10 MG tablet Take 1 tablet (10 mg total) by mouth every 6 (six) hours as needed for nausea or vomiting. 12/06/17   Alla Feeling, NP     Vital Signs: BP (!) 143/83 (BP Location: Right  Arm)    Pulse (!) 102    Temp 98.6 F (37 C) (Oral)    Resp 18    Ht 5\' 5"  (1.651 m)    Wt 104 lb 0.9 oz (47.2 kg)    LMP 10/23/2015    SpO2 97%    BMI 17.32 kg/m   Physical Exam awake/alert; chest- few bilat crackles; heart-RRR; abd- soft,+BS, mod tender RUQ; no sig LE edema  Imaging: Dg Chest 2 View  Result Date: 12/14/2018 CLINICAL DATA:  Abdominal pain EXAM: CHEST - 2 VIEW COMPARISON:  09/01/2018 PET FINDINGS: Right jugular Port-A-Cath with its  tip at the cavoatrial junction is stable. Normal heart size. Clear lungs. No pneumothorax or pleural effusion. IMPRESSION: No active cardiopulmonary disease. Electronically Signed   By: Marybelle Killings M.D.   On: 12/14/2018 16:08   Ct Chest W Contrast  Addendum Date: 12/14/2018   ADDENDUM REPORT: 12/14/2018 19:17 ADDENDUM: Addendum created for clarification of findings related to the gallbladder. Distended gallbladder with inflammatory changes compatible with acute cholecystitis. The obstructive calcified stone is likely in the infundibulum/proximal cystic duct, not within common bile duct. No calcified stones are visualized within the common bile duct. Findings discussed with Dr. Johney Maine of general surgery. Electronically Signed   By: Corrie Mckusick D.O.   On: 12/14/2018 19:17   Result Date: 12/14/2018 CLINICAL DATA:  58 year old female with a history of metastatic pancreatic cancer with right upper quadrant pain and fever EXAM: CT CHEST, ABDOMEN, AND PELVIS WITH CONTRAST TECHNIQUE: Multidetector CT imaging of the chest, abdomen and pelvis was performed following the standard protocol during bolus administration of intravenous contrast. CONTRAST:  186mL OMNIPAQUE IOHEXOL 300 MG/ML  SOLN COMPARISON:  Multiple prior PET CT most recent 09/01/2018, most recent CT abdomen 10/14/2017 FINDINGS: CT CHEST FINDINGS Cardiovascular: Heart size within normal limits. No pericardial fluid/thickening. Unremarkable course caliber contour of the thoracic aorta. Unremarkable diameter of the main pulmonary artery. No proximal filling defects. No significant atherosclerosis. Port catheter on the right chest wall with IJ approach. Mediastinum/Nodes: No adenopathy. Unremarkable appearance of the thoracic esophagus. Unremarkable thoracic inlet. Lungs/Pleura: No pneumothorax or pleural effusion. No confluent airspace disease. Atelectatic changes/scarring bilateral lungs, predominantly in the dependent lung bases, lingula, right middle  lobe. Musculoskeletal: No acute displaced fracture. No significant degenerative changes. No bony canal narrowing. CT ABDOMEN PELVIS FINDINGS Hepatobiliary: There are treatment changes within the liver, with multiple regions of necrotic/cystic change, new from the baseline CT of 07/23/2017. Compared to the most recent PET/CT they are has been significant interval decrease in size of all of the lesions previously noted, involving all liver segments. Index lesion in segment 4 B measures 5.8 cm with internal necrosis. Index lesion within segment 5/6 measuring 5.7 cm, with internal necrosis. Index lesion segment 8 measures 3.2 cm with internal necrosis. Hypervascular region on the margin of a treated lesion within segment 5 (image 122 of series 3) likely perfusion anomaly/Thad. Distention of the gallbladder with gallbladder wall thickening and multiple intrahepatic stones. Calcified stone in the hilum of the liver, likely within the cystic duct. No calcified stone is identified within the distal common bile duct. Pancreas: Unchanged appearance of the pancreas with mild dilation of the pancreatic duct in the body of the pancreas. No inflammatory changes or calcified parenchyma. Spleen: Unremarkable spleen Adrenals/Urinary Tract: Unremarkable appearance of the adrenal glands. No evidence of hydronephrosis of the right or left kidney. No nephrolithiasis. Unremarkable course of the bilateral ureters. Unremarkable appearance of the urinary bladder. Stomach/Bowel: Unremarkable stomach.  Small bowel unremarkable without abnormal distention or focal inflammatory changes. Enteric contrast reaches the terminal ileum. Moderate stool burden. No evidence of obstruction. Vascular/Lymphatic: No significant atherosclerotic changes. Edema within the fat at the liver hilum, surrounding the gallbladder within the right abdomen, with mild edema within the mesenteric fat of the mid abdomen. No adenopathy. Reproductive: Unremarkable uterus.  Other: None Musculoskeletal: No acute displaced fracture. IMPRESSION: CT demonstrates acute cholecystitis, with choledocholithiasis of the cystic duct. These results were discussed by telephone at the time of interpretation on 12/14/2018 at 5:05 pm with Dr. Burr Medico. Redemonstration of known liver metastases, with positive response to therapy, interval reduction in size of all lesions, with multiple demonstrating significant internal necrosis. No acute finding of the chest. Ancillary findings as above. Electronically Signed: By: Corrie Mckusick D.O. On: 12/14/2018 17:12   Ct Abdomen Pelvis W Contrast  Addendum Date: 12/14/2018   ADDENDUM REPORT: 12/14/2018 19:17 ADDENDUM: Addendum created for clarification of findings related to the gallbladder. Distended gallbladder with inflammatory changes compatible with acute cholecystitis. The obstructive calcified stone is likely in the infundibulum/proximal cystic duct, not within common bile duct. No calcified stones are visualized within the common bile duct. Findings discussed with Dr. Johney Maine of general surgery. Electronically Signed   By: Corrie Mckusick D.O.   On: 12/14/2018 19:17   Result Date: 12/14/2018 CLINICAL DATA:  59 year old female with a history of metastatic pancreatic cancer with right upper quadrant pain and fever EXAM: CT CHEST, ABDOMEN, AND PELVIS WITH CONTRAST TECHNIQUE: Multidetector CT imaging of the chest, abdomen and pelvis was performed following the standard protocol during bolus administration of intravenous contrast. CONTRAST:  146mL OMNIPAQUE IOHEXOL 300 MG/ML  SOLN COMPARISON:  Multiple prior PET CT most recent 09/01/2018, most recent CT abdomen 10/14/2017 FINDINGS: CT CHEST FINDINGS Cardiovascular: Heart size within normal limits. No pericardial fluid/thickening. Unremarkable course caliber contour of the thoracic aorta. Unremarkable diameter of the main pulmonary artery. No proximal filling defects. No significant atherosclerosis. Port catheter  on the right chest wall with IJ approach. Mediastinum/Nodes: No adenopathy. Unremarkable appearance of the thoracic esophagus. Unremarkable thoracic inlet. Lungs/Pleura: No pneumothorax or pleural effusion. No confluent airspace disease. Atelectatic changes/scarring bilateral lungs, predominantly in the dependent lung bases, lingula, right middle lobe. Musculoskeletal: No acute displaced fracture. No significant degenerative changes. No bony canal narrowing. CT ABDOMEN PELVIS FINDINGS Hepatobiliary: There are treatment changes within the liver, with multiple regions of necrotic/cystic change, new from the baseline CT of 07/23/2017. Compared to the most recent PET/CT they are has been significant interval decrease in size of all of the lesions previously noted, involving all liver segments. Index lesion in segment 4 B measures 5.8 cm with internal necrosis. Index lesion within segment 5/6 measuring 5.7 cm, with internal necrosis. Index lesion segment 8 measures 3.2 cm with internal necrosis. Hypervascular region on the margin of a treated lesion within segment 5 (image 122 of series 3) likely perfusion anomaly/Thad. Distention of the gallbladder with gallbladder wall thickening and multiple intrahepatic stones. Calcified stone in the hilum of the liver, likely within the cystic duct. No calcified stone is identified within the distal common bile duct. Pancreas: Unchanged appearance of the pancreas with mild dilation of the pancreatic duct in the body of the pancreas. No inflammatory changes or calcified parenchyma. Spleen: Unremarkable spleen Adrenals/Urinary Tract: Unremarkable appearance of the adrenal glands. No evidence of hydronephrosis of the right or left kidney. No nephrolithiasis. Unremarkable course of the bilateral ureters. Unremarkable appearance of the urinary bladder. Stomach/Bowel: Unremarkable stomach.  Small bowel unremarkable without abnormal distention or focal inflammatory changes. Enteric contrast  reaches the terminal ileum. Moderate stool burden. No evidence of obstruction. Vascular/Lymphatic: No significant atherosclerotic changes. Edema within the fat at the liver hilum, surrounding the gallbladder within the right abdomen, with mild edema within the mesenteric fat of the mid abdomen. No adenopathy. Reproductive: Unremarkable uterus. Other: None Musculoskeletal: No acute displaced fracture. IMPRESSION: CT demonstrates acute cholecystitis, with choledocholithiasis of the cystic duct. These results were discussed by telephone at the time of interpretation on 12/14/2018 at 5:05 pm with Dr. Burr Medico. Redemonstration of known liver metastases, with positive response to therapy, interval reduction in size of all lesions, with multiple demonstrating significant internal necrosis. No acute finding of the chest. Ancillary findings as above. Electronically Signed: By: Corrie Mckusick D.O. On: 12/14/2018 17:12   Vas [place] Lower Extremity Venous (dvt)  Result Date: 12/15/2018  Lower Venous Study Indications: Edema. Left greater than right  Risk Factors: Cancer Pancreatic. Performing Technologist: Toma Copier RVS  Examination Guidelines: A complete evaluation includes B-mode imaging, spectral Doppler, color Doppler, and power Doppler as needed of all accessible portions of each vessel. Bilateral testing is considered an integral part of a complete examination. Limited examinations for reoccurring indications may be performed as noted.  +-----+---------------+---------+-----------+----------+-------+  RIGHT Compressibility Phasicity Spontaneity Properties Summary  +-----+---------------+---------+-----------+----------+-------+  CFV   Full            Yes       Yes                             +-----+---------------+---------+-----------+----------+-------+  SFJ   Full                                                      +-----+---------------+---------+-----------+----------+-------+    +---------+---------------+---------+-----------+----------+------------------+  LEFT      Compressibility Phasicity Spontaneity Properties Summary             +---------+---------------+---------+-----------+----------+------------------+  CFV       Full            Yes       Yes                                        +---------+---------------+---------+-----------+----------+------------------+  SFJ       Full                                                                 +---------+---------------+---------+-----------+----------+------------------+  FV Prox   Full            Yes       Yes                                        +---------+---------------+---------+-----------+----------+------------------+  FV Mid    Full                                                                 +---------+---------------+---------+-----------+----------+------------------+  FV Distal Full            Yes       Yes                                        +---------+---------------+---------+-----------+----------+------------------+  PFV       Full            Yes       Yes                                        +---------+---------------+---------+-----------+----------+------------------+  POP       Full            Yes       Yes                                        +---------+---------------+---------+-----------+----------+------------------+  PTV       Partial                                          Acute               +---------+---------------+---------+-----------+----------+------------------+  PERO      Full                                             Difficult to image  +---------+---------------+---------+-----------+----------+------------------+   Left Technical Findings: Acute DVT noted in one of the paired posterior tibial veins in the distal calf   Summary: Right: There is no evidence of a common femoral vein obstruction. Left: Findings consistent with acute deep vein thrombosis involving the left posterior  tibial vein. See technical findings listed above  *See table(s) above for measurements and observations.    Preliminary     Labs:  CBC: Recent Labs    12/05/18 1058 12/12/18 1056 12/14/18 1317 12/15/18 0841  WBC 3.3* 2.0* 7.4 5.9   5.9  HGB 9.8* 8.8* 8.9* 7.1*   7.2*  HCT 30.1* 26.8* 26.3* 21.4*   21.3*  PLT 126* 120* 132* 93*   90*    COAGS: Recent Labs    12/15/18 0941  INR 1.0    BMP: Recent Labs    12/05/18 1058 12/12/18 1056 12/14/18 1317 12/15/18 0841  NA 140 138 135 135   135  K 3.8 3.8 3.5 3.2*   3.3*  CL 105 103 99 100   100  CO2 27 27 27 28   28   GLUCOSE 110* 110* 147* 98   97  BUN 23* 21* 23* 22*   22*  CALCIUM 9.5 9.3 9.0 8.6*   8.6*  CREATININE 1.08* 1.04* 1.14* 0.96   0.99  GFRNONAA 58* >60 54* >60   >60  GFRAA >60 >60 >60 >60   >60    LIVER FUNCTION TESTS: Recent Labs    12/05/18 1058 12/12/18 1056 12/14/18 1317 12/15/18 0841  BILITOT 0.5 0.5 1.5* 1.9*   1.8*  AST 56* 48* 55* 38  38  ALT 58* 53* 57* 45*   45*  ALKPHOS 204* 317* 354* 221*   227*  PROT 7.7 7.7 7.7 6.5   6.6  ALBUMIN 3.6 3.4* 3.4* 3.0*   3.0*    Assessment and Plan: Pt with history of metastatic pancreatic cancer (on chemo) ; recently admitted to [facility] with right upper quadrant abdominal pain, low-grade temp elevation, left leg swelling. Subsequent imaging revealed distended gallbladder with inflammatory changes compatible with acute cholecystitis.  There was an obstructive calcified stone likely in the infundibulum/proximal cystic duct.  Also present were known liver metastases.  Left lower extremity venous Doppler study performed today revealed findings consistent with acute DVT involving the left posterior tibial vein.  IV heparin treatment pending. Request now received from surgery for percutaneous cholecystostomy.  Imaging studies have been reviewed by Dr. Anselm Pancoast.  Details/risks of procedure, including but not limited to, internal bleeding, infection, injury  to adjacent structures discussed with patient with her understanding and consent.  Procedure scheduled for later today.  Current labs include WBC 5.9, hemoglobin 7.1, platelets 93k, PT 13.4, INR 1.0, creatinine 0.99, total bilirubin 1.8.  Blood/urine cultures pending.  All of the patient's questions were answered, patient is agreeable to proceed. Consent signed and in chart.     Electronically Signed: D. Rowe Robert, PA-C 12/15/2018, 11:25 AM   I spent a total of 25 minutes at the the patient's bedside AND on the patient's hospital floor or unit, greater than 50% of which was counseling/coordinating care for percutaneous cholecystostomy    Patient ID: DORENDA PFANNENSTIEL, female   DOB: 1960/12/23, 58 y.o.   MRN: [id number]

## 2018-12-15 NOTE — Progress Notes (Signed)
Left lower extremity venous duplex completed. Preliminary results in Chart review CV Proc. [facility], [place] 12/15/2018, 9:20 am

## 2018-12-16 LAB — HEMOGLOBIN AND HEMATOCRIT, BLOOD
HCT: 23.8 % — ABNORMAL LOW (ref 36.0–46.0)
Hemoglobin: 8.1 g/dL — ABNORMAL LOW (ref 12.0–15.0)

## 2018-12-16 LAB — HEPATIC FUNCTION PANEL
ALT: 42 U/L (ref 0–44)
AST: 37 U/L (ref 15–41)
Albumin: 2.9 g/dL — ABNORMAL LOW (ref 3.5–5.0)
Alkaline Phosphatase: 255 U/L — ABNORMAL HIGH (ref 38–126)
Bilirubin, Direct: 0.5 mg/dL — ABNORMAL HIGH (ref 0.0–0.2)
Indirect Bilirubin: 0.6 mg/dL (ref 0.3–0.9)
Total Bilirubin: 1.1 mg/dL (ref 0.3–1.2)
Total Protein: 6.2 g/dL — ABNORMAL LOW (ref 6.5–8.1)

## 2018-12-16 LAB — CBC
HCT: 20.1 % — ABNORMAL LOW (ref 36.0–46.0)
Hemoglobin: 6.5 g/dL — CL (ref 12.0–15.0)
MCH: 34.2 pg — ABNORMAL HIGH (ref 26.0–34.0)
MCHC: 32.3 g/dL (ref 30.0–36.0)
MCV: 105.8 fL — ABNORMAL HIGH (ref 80.0–100.0)
Platelets: 91 10*3/uL — ABNORMAL LOW (ref 150–400)
RBC: 1.9 MIL/uL — ABNORMAL LOW (ref 3.87–5.11)
RDW: 15 % (ref 11.5–15.5)
WBC: 4 10*3/uL (ref 4.0–10.5)
nRBC: 0 % (ref 0.0–0.2)

## 2018-12-16 LAB — BASIC METABOLIC PANEL
Anion gap: 8 (ref 5–15)
BUN: 17 mg/dL (ref 6–20)
CO2: 26 mmol/L (ref 22–32)
Calcium: 8.5 mg/dL — ABNORMAL LOW (ref 8.9–10.3)
Chloride: 103 mmol/L (ref 98–111)
Creatinine, Ser: 0.99 mg/dL (ref 0.44–1.00)
GFR calc Af Amer: >60 mL/min (ref >60)
GFR calc non Af Amer: >60 mL/min (ref >60)
Glucose, Bld: 100 mg/dL — ABNORMAL HIGH (ref 70–99)
Potassium: 3.4 mmol/L — ABNORMAL LOW (ref 3.5–5.1)
Sodium: 137 mmol/L (ref 135–145)

## 2018-12-16 LAB — HEPARIN LEVEL (UNFRACTIONATED): Heparin Unfractionated: 0.3 IU/mL (ref 0.30–0.70)

## 2018-12-16 LAB — GLUCOSE, CAPILLARY: Glucose-Capillary: 94 mg/dL (ref 70–99)

## 2018-12-16 LAB — PREPARE RBC (CROSSMATCH)

## 2018-12-16 MED ORDER — HYDROMORPHONE HCL 1 MG/ML IJ SOLN: 0.5000 mg | INTRAMUSCULAR | Status: DC | PRN

## 2018-12-16 MED ORDER — POTASSIUM CHLORIDE CRYS ER 10 MEQ PO TBCR
40.0000 meq | EXTENDED_RELEASE_TABLET | Freq: Once | ORAL | Status: AC
  Administered 2018-12-16: 40 meq via ORAL
  Filled 2018-12-16: qty 4

## 2018-12-16 MED ORDER — SODIUM CHLORIDE 0.9% IV SOLUTION
Freq: Once | INTRAVENOUS | Status: AC
  Administered 2018-12-16: 11:00:00 via INTRAVENOUS

## 2018-12-16 NOTE — Progress Notes (Signed)
DBIV: Pt refusing peripheral lab draws. Heparin level drawn from port. Paused infusion x5 minutes. Flushed site w 75mL saline, wasted 12mL prior to drawing sample. Note to pharmacy with this information.

## 2018-12-16 NOTE — Progress Notes (Signed)
ANTICOAGULATION CONSULT NOTE - Follow Up Consult  Pharmacy Consult for Heparin Indication: DVT  No Known Allergies  Patient Measurements: Height: 5\' 5"  (165.1 cm) Weight: 104 lb 0.9 oz (47.2 kg) IBW/kg (Calculated) : 57 Heparin Dosing Weight:   Vital Signs: Temp: 98.8 F (37.1 C) (04/23 2055) Temp Source: Oral (04/23 1508) BP: 101/65 (04/23 2055) Pulse Rate: 81 (04/23 2055)  Labs: Recent Labs    12/14/18 1317 12/15/18 0841 12/15/18 0941 12/15/18 2258  HGB 8.9* 7.1*  7.2*  --   --   HCT 26.3* 21.4*  21.3*  --   --   PLT 132* 93*  90*  --   --   APTT  --   --  46*  --   LABPROT  --   --  13.4  --   INR  --   --  1.0  --   HEPARINUNFRC  --   --   --  0.10*  CREATININE 1.14* 0.96  0.99  --   --     Estimated Creatinine Clearance: 48.8 mL/min (by C-G formula based on SCr of 0.96 mg/dL).   Medications:  Infusions:  . sodium chloride 250 mL (12/15/18 2206)  . heparin 900 Units/hr (12/15/18 1731)  . lactated ringers    . methocarbamol (ROBAXIN) IV    . ondansetron (ZOFRAN) IV    . piperacillin-tazobactam (ZOSYN)  IV 3.375 g (12/15/18 1728)    Assessment: Patient with low heparin level.  No heparin issues per RN.  Goal of Therapy:  Heparin level 0.3-0.7 units/ml Monitor platelets by anticoagulation protocol: Yes   Plan:  Increase heparin to 1050 units/hr Recheck level at [address] [place] 12/16/2018,12:16 AM

## 2018-12-16 NOTE — Progress Notes (Signed)
Patient having increased blood coming from percutaneous cholecystostomy tube.  Unable to even empty drainage bag due to clots.  Called Dr. Maylene Roes, and order was received to stop heparin drip.  Interventional radiology not available.  Went to OR to find new bag, but could not find an equivalent.

## 2018-12-16 NOTE — Progress Notes (Signed)
Assessment & Plan: Acute calculous cholecystitis  Perc cholecystostomy tube placed yesterday  Monitor output - serosanguinous this AM  On abx Stage IV pancreatic ca  Continue chemo Rx        Armandina Gemma, MD       [facility]       Office: [phone]   Chief Complaint: Acute cholecystitis  Subjective: Patient in bed, bright, alert, mild pain.  Objective: Vital signs in last 24 hours: Temp:  [98.2 F (36.8 C)-98.8 F (37.1 C)] 98.2 F (36.8 C) (04/24 0558) Pulse Rate:  [72-81] 75 (04/24 0558) Resp:  [13-22] 17 (04/24 0558) BP: (89-106)/(58-65) 94/58 (04/24 0558) SpO2:  [97 %-100 %] 99 % (04/24 0558) Last BM Date: 12/15/18  Intake/Output from previous day: 04/23 0701 - 04/24 0700 In: 1405.2 [P.O.:800; I.V.:504.5; IV Piggyback:100.7] Out: 500 [Urine:300; Drains:200] Intake/Output this shift: No intake/output data recorded.  Physical Exam: HEENT - sclerae clear, mucous membranes moist Abdomen - soft, mild distension; mild diffuse tenderness, max RUQ; perc drain with serosanguinous output Ext - no edema, non-tender Neuro - alert & oriented, no focal deficits  Lab Results:  Recent Labs    12/14/18 1317 12/15/18 0841  WBC 7.4 5.9   5.9  HGB 8.9* 7.1*   7.2*  HCT 26.3* 21.4*   21.3*  PLT 132* 93*   90*   BMET Recent Labs    12/14/18 1317 12/15/18 0841  NA 135 135   135  K 3.5 3.2*   3.3*  CL 99 100   100  CO2 27 28   28   GLUCOSE 147* 98   97  BUN 23* 22*   22*  CREATININE 1.14* 0.96   0.99  CALCIUM 9.0 8.6*   8.6*   PT/INR Recent Labs    12/15/18 0941  LABPROT 13.4  INR 1.0   Comprehensive Metabolic Panel:    Component Value Date/Time   NA 135 12/15/2018 0841   NA 135 12/15/2018 0841   NA 134 (L) 08/23/2017 0951   NA 132 (L) 08/20/2017 1257   K 3.2 (L) 12/15/2018 0841   K 3.3 (L) 12/15/2018 0841   K 2.5 Repeated and Verified (LL) 08/23/2017 0951   K 3.6 08/20/2017 1257   CL 100 12/15/2018 0841   CL 100  12/15/2018 0841   CO2 28 12/15/2018 0841   CO2 28 12/15/2018 0841   CO2 26 08/23/2017 0951   CO2 29 08/20/2017 1257   BUN 22 (H) 12/15/2018 0841   BUN 22 (H) 12/15/2018 0841   BUN 11.6 08/23/2017 0951   BUN 17.5 08/20/2017 1257   CREATININE 0.96 12/15/2018 0841   CREATININE 0.99 12/15/2018 0841   CREATININE 1.04 (H) 12/12/2018 1056   CREATININE 1.61 (H) 01/12/2018 1130   CREATININE 1.0 08/23/2017 0951   CREATININE 1.4 (H) 08/20/2017 1257   GLUCOSE 98 12/15/2018 0841   GLUCOSE 97 12/15/2018 0841   GLUCOSE 116 08/23/2017 0951   GLUCOSE 114 08/20/2017 1257   CALCIUM 8.6 (L) 12/15/2018 0841   CALCIUM 8.6 (L) 12/15/2018 0841   CALCIUM 10.4 08/23/2017 0951   CALCIUM 14.2 (HH) 08/20/2017 1257   AST 38 12/15/2018 0841   AST 38 12/15/2018 0841   AST 48 (H) 12/12/2018 1056   AST 49 (H) 01/12/2018 1130   AST 208 (HH) 08/23/2017 0951   AST 221 (HH) 08/20/2017 1257   ALT 45 (H) 12/15/2018 0841   ALT 45 (H) 12/15/2018 0841   ALT 53 (H) 12/12/2018  1056   ALT 44 01/12/2018 1130   ALT 151 (H) 08/23/2017 0951   ALT 170 (H) 08/20/2017 1257   ALKPHOS 221 (H) 12/15/2018 0841   ALKPHOS 227 (H) 12/15/2018 0841   ALKPHOS 549 (H) 08/23/2017 0951   ALKPHOS 531 (H) 08/20/2017 1257   BILITOT 1.9 (H) 12/15/2018 0841   BILITOT 1.8 (H) 12/15/2018 0841   BILITOT 0.5 12/12/2018 1056   BILITOT 1.1 01/12/2018 1130   BILITOT 0.82 08/23/2017 0951   BILITOT 1.92 (H) 08/20/2017 1257   PROT 6.5 12/15/2018 0841   PROT 6.6 12/15/2018 0841   PROT 6.4 08/23/2017 0951   PROT 7.2 08/20/2017 1257   ALBUMIN 3.0 (L) 12/15/2018 0841   ALBUMIN 3.0 (L) 12/15/2018 0841   ALBUMIN 2.9 (L) 08/23/2017 0951   ALBUMIN 3.4 (L) 08/20/2017 1257    Studies/Results: Dg Chest 2 View  Result Date: 12/14/2018 CLINICAL DATA:  Abdominal pain EXAM: CHEST - 2 VIEW COMPARISON:  09/01/2018 PET FINDINGS: Right jugular Port-A-Cath with its tip at the cavoatrial junction is stable. Normal heart size. Clear lungs. No pneumothorax or  pleural effusion. IMPRESSION: No active cardiopulmonary disease. Electronically Signed   By: Marybelle Killings M.D.   On: 12/14/2018 16:08   Ct Chest W Contrast  Addendum Date: 12/14/2018   ADDENDUM REPORT: 12/14/2018 19:17 ADDENDUM: Addendum created for clarification of findings related to the gallbladder. Distended gallbladder with inflammatory changes compatible with acute cholecystitis. The obstructive calcified stone is likely in the infundibulum/proximal cystic duct, not within common bile duct. No calcified stones are visualized within the common bile duct. Findings discussed with Dr. Johney Maine of general surgery. Electronically Signed   By: Corrie Mckusick D.O.   On: 12/14/2018 19:17   Result Date: 12/14/2018 CLINICAL DATA:  58 year old female with a history of metastatic pancreatic cancer with right upper quadrant pain and fever EXAM: CT CHEST, ABDOMEN, AND PELVIS WITH CONTRAST TECHNIQUE: Multidetector CT imaging of the chest, abdomen and pelvis was performed following the standard protocol during bolus administration of intravenous contrast. CONTRAST:  169mL OMNIPAQUE IOHEXOL 300 MG/ML  SOLN COMPARISON:  Multiple prior PET CT most recent 09/01/2018, most recent CT abdomen 10/14/2017 FINDINGS: CT CHEST FINDINGS Cardiovascular: Heart size within normal limits. No pericardial fluid/thickening. Unremarkable course caliber contour of the thoracic aorta. Unremarkable diameter of the main pulmonary artery. No proximal filling defects. No significant atherosclerosis. Port catheter on the right chest wall with IJ approach. Mediastinum/Nodes: No adenopathy. Unremarkable appearance of the thoracic esophagus. Unremarkable thoracic inlet. Lungs/Pleura: No pneumothorax or pleural effusion. No confluent airspace disease. Atelectatic changes/scarring bilateral lungs, predominantly in the dependent lung bases, lingula, right middle lobe. Musculoskeletal: No acute displaced fracture. No significant degenerative changes. No bony  canal narrowing. CT ABDOMEN PELVIS FINDINGS Hepatobiliary: There are treatment changes within the liver, with multiple regions of necrotic/cystic change, new from the baseline CT of 07/23/2017. Compared to the most recent PET/CT they are has been significant interval decrease in size of all of the lesions previously noted, involving all liver segments. Index lesion in segment 4 B measures 5.8 cm with internal necrosis. Index lesion within segment 5/6 measuring 5.7 cm, with internal necrosis. Index lesion segment 8 measures 3.2 cm with internal necrosis. Hypervascular region on the margin of a treated lesion within segment 5 (image 122 of series 3) likely perfusion anomaly/Thad. Distention of the gallbladder with gallbladder wall thickening and multiple intrahepatic stones. Calcified stone in the hilum of the liver, likely within the cystic duct. No calcified stone is identified within  the distal common bile duct. Pancreas: Unchanged appearance of the pancreas with mild dilation of the pancreatic duct in the body of the pancreas. No inflammatory changes or calcified parenchyma. Spleen: Unremarkable spleen Adrenals/Urinary Tract: Unremarkable appearance of the adrenal glands. No evidence of hydronephrosis of the right or left kidney. No nephrolithiasis. Unremarkable course of the bilateral ureters. Unremarkable appearance of the urinary bladder. Stomach/Bowel: Unremarkable stomach. Small bowel unremarkable without abnormal distention or focal inflammatory changes. Enteric contrast reaches the terminal ileum. Moderate stool burden. No evidence of obstruction. Vascular/Lymphatic: No significant atherosclerotic changes. Edema within the fat at the liver hilum, surrounding the gallbladder within the right abdomen, with mild edema within the mesenteric fat of the mid abdomen. No adenopathy. Reproductive: Unremarkable uterus. Other: None Musculoskeletal: No acute displaced fracture. IMPRESSION: CT demonstrates acute  cholecystitis, with choledocholithiasis of the cystic duct. These results were discussed by telephone at the time of interpretation on 12/14/2018 at 5:05 pm with Dr. Burr Medico. Redemonstration of known liver metastases, with positive response to therapy, interval reduction in size of all lesions, with multiple demonstrating significant internal necrosis. No acute finding of the chest. Ancillary findings as above. Electronically Signed: By: Corrie Mckusick D.O. On: 12/14/2018 17:12   Ct Abdomen Pelvis W Contrast  Addendum Date: 12/14/2018   ADDENDUM REPORT: 12/14/2018 19:17 ADDENDUM: Addendum created for clarification of findings related to the gallbladder. Distended gallbladder with inflammatory changes compatible with acute cholecystitis. The obstructive calcified stone is likely in the infundibulum/proximal cystic duct, not within common bile duct. No calcified stones are visualized within the common bile duct. Findings discussed with Dr. Johney Maine of general surgery. Electronically Signed   By: Corrie Mckusick D.O.   On: 12/14/2018 19:17   Result Date: 12/14/2018 CLINICAL DATA:  58 year old female with a history of metastatic pancreatic cancer with right upper quadrant pain and fever EXAM: CT CHEST, ABDOMEN, AND PELVIS WITH CONTRAST TECHNIQUE: Multidetector CT imaging of the chest, abdomen and pelvis was performed following the standard protocol during bolus administration of intravenous contrast. CONTRAST:  154mL OMNIPAQUE IOHEXOL 300 MG/ML  SOLN COMPARISON:  Multiple prior PET CT most recent 09/01/2018, most recent CT abdomen 10/14/2017 FINDINGS: CT CHEST FINDINGS Cardiovascular: Heart size within normal limits. No pericardial fluid/thickening. Unremarkable course caliber contour of the thoracic aorta. Unremarkable diameter of the main pulmonary artery. No proximal filling defects. No significant atherosclerosis. Port catheter on the right chest wall with IJ approach. Mediastinum/Nodes: No adenopathy. Unremarkable  appearance of the thoracic esophagus. Unremarkable thoracic inlet. Lungs/Pleura: No pneumothorax or pleural effusion. No confluent airspace disease. Atelectatic changes/scarring bilateral lungs, predominantly in the dependent lung bases, lingula, right middle lobe. Musculoskeletal: No acute displaced fracture. No significant degenerative changes. No bony canal narrowing. CT ABDOMEN PELVIS FINDINGS Hepatobiliary: There are treatment changes within the liver, with multiple regions of necrotic/cystic change, new from the baseline CT of 07/23/2017. Compared to the most recent PET/CT they are has been significant interval decrease in size of all of the lesions previously noted, involving all liver segments. Index lesion in segment 4 B measures 5.8 cm with internal necrosis. Index lesion within segment 5/6 measuring 5.7 cm, with internal necrosis. Index lesion segment 8 measures 3.2 cm with internal necrosis. Hypervascular region on the margin of a treated lesion within segment 5 (image 122 of series 3) likely perfusion anomaly/Thad. Distention of the gallbladder with gallbladder wall thickening and multiple intrahepatic stones. Calcified stone in the hilum of the liver, likely within the cystic duct. No calcified stone is identified within  the distal common bile duct. Pancreas: Unchanged appearance of the pancreas with mild dilation of the pancreatic duct in the body of the pancreas. No inflammatory changes or calcified parenchyma. Spleen: Unremarkable spleen Adrenals/Urinary Tract: Unremarkable appearance of the adrenal glands. No evidence of hydronephrosis of the right or left kidney. No nephrolithiasis. Unremarkable course of the bilateral ureters. Unremarkable appearance of the urinary bladder. Stomach/Bowel: Unremarkable stomach. Small bowel unremarkable without abnormal distention or focal inflammatory changes. Enteric contrast reaches the terminal ileum. Moderate stool burden. No evidence of obstruction.  Vascular/Lymphatic: No significant atherosclerotic changes. Edema within the fat at the liver hilum, surrounding the gallbladder within the right abdomen, with mild edema within the mesenteric fat of the mid abdomen. No adenopathy. Reproductive: Unremarkable uterus. Other: None Musculoskeletal: No acute displaced fracture. IMPRESSION: CT demonstrates acute cholecystitis, with choledocholithiasis of the cystic duct. These results were discussed by telephone at the time of interpretation on 12/14/2018 at 5:05 pm with Dr. Burr Medico. Redemonstration of known liver metastases, with positive response to therapy, interval reduction in size of all lesions, with multiple demonstrating significant internal necrosis. No acute finding of the chest. Ancillary findings as above. Electronically Signed: By: Corrie Mckusick D.O. On: 12/14/2018 17:12   Ir Perc Cholecystostomy  Result Date: 12/15/2018 INDICATION: 58 year old with metastatic pancreatic cancer and acute cholecystitis. Patient is not a surgical candidate. EXAM: PLACEMENT OF CHOLECYSTOSTOMY TUBE WITH ULTRASOUND AND FLUOROSCOPIC GUIDANCE MEDICATIONS: Inpatient receiving scheduled IV antibiotics. ANESTHESIA/SEDATION: Moderate (conscious) sedation was employed during this procedure. A total of Versed 2.0 mg and Fentanyl 100 mcg was administered intravenously. Moderate Sedation Time: 18 minutes minutes. The patient's level of consciousness and vital signs were monitored continuously by radiology nursing throughout the procedure under my direct supervision. FLUOROSCOPY TIME:  Fluoroscopy Time: 54 seconds, 3 mGy CONTRAST:  5 mL Omnipaque 409 COMPLICATIONS: None immediate. PROCEDURE: Informed written consent was obtained from the patient after a thorough discussion of the procedural risks, benefits and alternatives. All questions were addressed. Maximal Sterile Barrier Technique was utilized including caps, mask, sterile gowns, sterile gloves, sterile drape, hand hygiene and skin  antiseptic. A timeout was performed prior to the initiation of the procedure. Right abdomen was prepped and draped in sterile fashion. Ultrasound was used to identify an abnormal gallbladder. Skin was anesthetized with 1% lidocaine. Small skin incision was made. 21 gauge needle was directed into the gallbladder with ultrasound guidance. 0.018 wire was advanced into the gallbladder and a transitional dilator set was placed. Stiff Amplatz wire was advanced into the gallbladder and the tract was dilated to accommodate a 10.2 [place] multipurpose drain. 80 mL of bloody bilious thick fluid was removed from the gallbladder. Fluid sample sent for culture. Catheter was sutured to skin and attached to a gravity bag. FINDINGS: Gallbladder is markedly abnormal. Gallbladder is distended with wall thickening. Extensive echogenic material in the gallbladder compatible with stones and sludge. Small amount of pericholecystic fluid or ascites. 10.2 French drain was successfully advanced into the gallbladder. 80 mL of bloody bilious fluid was removed from the gallbladder. IMPRESSION: Successful image guided cholecystostomy tube placement. Electronically Signed   By: Markus Daft M.D.   On: 12/15/2018 15:00   Vas [place] Lower Extremity Venous (dvt)  Result Date: 12/15/2018  Lower Venous Study Indications: Edema. Left greater than right  Risk Factors: Cancer Pancreatic. Performing Technologist: Toma Copier RVS  Examination Guidelines: A complete evaluation includes B-mode imaging, spectral Doppler, color Doppler, and power Doppler as needed of all accessible portions of each vessel. Bilateral  testing is considered an integral part of a complete examination. Limited examinations for reoccurring indications may be performed as noted.  +-----+---------------+---------+-----------+----------+-------+  RIGHT Compressibility Phasicity Spontaneity Properties Summary  +-----+---------------+---------+-----------+----------+-------+  CFV    Full            Yes       Yes                             +-----+---------------+---------+-----------+----------+-------+  SFJ   Full                                                      +-----+---------------+---------+-----------+----------+-------+   +---------+---------------+---------+-----------+----------+------------------+  LEFT      Compressibility Phasicity Spontaneity Properties Summary             +---------+---------------+---------+-----------+----------+------------------+  CFV       Full            Yes       Yes                                        +---------+---------------+---------+-----------+----------+------------------+  SFJ       Full                                                                 +---------+---------------+---------+-----------+----------+------------------+  FV Prox   Full            Yes       Yes                                        +---------+---------------+---------+-----------+----------+------------------+  FV Mid    Full                                                                 +---------+---------------+---------+-----------+----------+------------------+  FV Distal Full            Yes       Yes                                        +---------+---------------+---------+-----------+----------+------------------+  PFV       Full            Yes       Yes                                        +---------+---------------+---------+-----------+----------+------------------+  POP       Full  Yes       Yes                                        +---------+---------------+---------+-----------+----------+------------------+  PTV       Partial                                          Acute               +---------+---------------+---------+-----------+----------+------------------+  PERO      Full                                             Difficult to image  +---------+---------------+---------+-----------+----------+------------------+   Left  Technical Findings: Acute DVT noted in one of the paired posterior tibial veins in the distal calf   Summary: Right: There is no evidence of a common femoral vein obstruction. Left: Findings consistent with acute deep vein thrombosis involving the left posterior tibial vein. See technical findings listed above  *See table(s) above for measurements and observations. Electronically signed by Servando Snare MD on 12/15/2018 at 5:10:58 PM.    Final       Armandina Gemma 12/16/2018  Patient ID: Morgan Dawson, female   DOB: 1961-06-28, 58 y.o.   MRN: [id number]

## 2018-12-16 NOTE — Plan of Care (Signed)
  Problem: Clinical Measurements: Goal: Will remain free from infection Outcome: Progressing   Problem: Nutrition: Goal: Adequate nutrition will be maintained Outcome: Progressing   Problem: Coping: Goal: Level of anxiety will decrease Outcome: Progressing   Problem: Pain Managment: Goal: General experience of comfort will improve Outcome: Progressing   Problem: Elimination: Goal: Will not experience complications related to bowel motility Outcome: Progressing

## 2018-12-16 NOTE — Progress Notes (Signed)
Referring Physician(s): Jeanmarie Hubert  Supervising Physician: Markus Daft  Patient Status:  [facility] - In-pt  Chief Complaint: Abdominal pain, cholecystitis, metastatic pancreatic cancer   Subjective: Patient doing okay this a.m.  Has some mild right upper quadrant discomfort; denies nausea or vomiting, tolerating diet okay.  Bloody output from gallbladder drain.  Receiving blood transfusion. On IV heparin for LE (post tib)DVT   Allergies: Patient has no known allergies.  Medications: Prior to Admission medications   Medication Sig Start Date End Date Taking? Authorizing Provider  b complex vitamins tablet Take 1 tablet by mouth daily.   Yes [provider]  gabapentin (NEURONTIN) 100 MG capsule Take 1 capsule (100 mg total) by mouth 3 (three) times daily. Patient taking differently: Take 100 mg by mouth 2 (two) times daily.  06/03/18  Yes [name], MD  potassium chloride (K-DUR) 10 MEQ tablet Take 1 tablet (10 mEq total) by mouth 4 (four) times daily. 06/08/18  Yes [name], MD  traMADol (ULTRAM) 50 MG tablet Take 1 tablet (50 mg total) by mouth every 6 (six) hours as needed. Patient taking differently: Take 50 mg by mouth every 6 (six) hours as needed for moderate pain or severe pain.  08/22/18  Yes [name], MD  loperamide (IMODIUM) 2 MG capsule Take 1 capsule (2 mg total) by mouth as needed for diarrhea or loose stools. 01/15/18   Lavina Hamman, MD  loratadine (CLARITIN) 10 MG tablet Take 10 mg by mouth daily as needed for allergies.    [provider]  magic mouthwash w/lidocaine SOLN Take 10 mLs by mouth 3 (three) times daily as needed for mouth pain. Swish and spit 10 ML by mouth 3 times daily as needed for mouth pain. 08/31/17   Alla Feeling, NP  meclizine (ANTIVERT) 25 MG tablet Take 25 mg by mouth 3 (three) times daily as needed for dizziness.    [provider]  ondansetron (ZOFRAN-ODT) 8 MG disintegrating tablet TAKE 1 TABLET BY MOUTH EVERY 8  HOURS AS NEEDED FOR NAUSE OR VOMITING Patient taking differently: Take 8 mg by mouth every 8 (eight) hours as needed for nausea or vomiting.  01/25/18   Alla Feeling, NP  prochlorperazine (COMPAZINE) 10 MG tablet Take 1 tablet (10 mg total) by mouth every 6 (six) hours as needed for nausea or vomiting. 12/06/17   Alla Feeling, NP     Vital Signs: BP 92/68 (BP Location: Left Arm) Comment: david notified   Pulse 85    Temp 99 F (37.2 C) (Oral)    Resp 17    Ht 5\' 5"  (1.651 m)    Wt 104 lb 0.9 oz (47.2 kg)    LMP 10/23/2015    SpO2 98%    BMI 17.32 kg/m   Physical Exam awake, alert.  Gallbladder drain intact, dressing dry, site mildly tender to palpation, output 200 cc bloody fluid.  Drain flushes okay per nurse.  Imaging: Dg Chest 2 View  Result Date: 12/14/2018 CLINICAL DATA:  Abdominal pain EXAM: CHEST - 2 VIEW COMPARISON:  09/01/2018 PET FINDINGS: Right jugular Port-A-Cath with its tip at the cavoatrial junction is stable. Normal heart size. Clear lungs. No pneumothorax or pleural effusion. IMPRESSION: No active cardiopulmonary disease. Electronically Signed   By: Marybelle Killings M.D.   On: 12/14/2018 16:08   Ct Chest W Contrast  Addendum Date: 12/14/2018   ADDENDUM REPORT: 12/14/2018 19:17 ADDENDUM: Addendum created for clarification of findings related to the gallbladder.  Distended gallbladder with inflammatory changes compatible with acute cholecystitis. The obstructive calcified stone is likely in the infundibulum/proximal cystic duct, not within common bile duct. No calcified stones are visualized within the common bile duct. Findings discussed with Dr. Johney Maine of general surgery. Electronically Signed   By: Corrie Mckusick D.O.   On: 12/14/2018 19:17   Result Date: 12/14/2018 CLINICAL DATA:  58 year old female with a history of metastatic pancreatic cancer with right upper quadrant pain and fever EXAM: CT CHEST, ABDOMEN, AND PELVIS WITH CONTRAST TECHNIQUE: Multidetector CT imaging of the  chest, abdomen and pelvis was performed following the standard protocol during bolus administration of intravenous contrast. CONTRAST:  175mL OMNIPAQUE IOHEXOL 300 MG/ML  SOLN COMPARISON:  Multiple prior PET CT most recent 09/01/2018, most recent CT abdomen 10/14/2017 FINDINGS: CT CHEST FINDINGS Cardiovascular: Heart size within normal limits. No pericardial fluid/thickening. Unremarkable course caliber contour of the thoracic aorta. Unremarkable diameter of the main pulmonary artery. No proximal filling defects. No significant atherosclerosis. Port catheter on the right chest wall with IJ approach. Mediastinum/Nodes: No adenopathy. Unremarkable appearance of the thoracic esophagus. Unremarkable thoracic inlet. Lungs/Pleura: No pneumothorax or pleural effusion. No confluent airspace disease. Atelectatic changes/scarring bilateral lungs, predominantly in the dependent lung bases, lingula, right middle lobe. Musculoskeletal: No acute displaced fracture. No significant degenerative changes. No bony canal narrowing. CT ABDOMEN PELVIS FINDINGS Hepatobiliary: There are treatment changes within the liver, with multiple regions of necrotic/cystic change, new from the baseline CT of 07/23/2017. Compared to the most recent PET/CT they are has been significant interval decrease in size of all of the lesions previously noted, involving all liver segments. Index lesion in segment 4 B measures 5.8 cm with internal necrosis. Index lesion within segment 5/6 measuring 5.7 cm, with internal necrosis. Index lesion segment 8 measures 3.2 cm with internal necrosis. Hypervascular region on the margin of a treated lesion within segment 5 (image 122 of series 3) likely perfusion anomaly/Thad. Distention of the gallbladder with gallbladder wall thickening and multiple intrahepatic stones. Calcified stone in the hilum of the liver, likely within the cystic duct. No calcified stone is identified within the distal common bile duct. Pancreas:  Unchanged appearance of the pancreas with mild dilation of the pancreatic duct in the body of the pancreas. No inflammatory changes or calcified parenchyma. Spleen: Unremarkable spleen Adrenals/Urinary Tract: Unremarkable appearance of the adrenal glands. No evidence of hydronephrosis of the right or left kidney. No nephrolithiasis. Unremarkable course of the bilateral ureters. Unremarkable appearance of the urinary bladder. Stomach/Bowel: Unremarkable stomach. Small bowel unremarkable without abnormal distention or focal inflammatory changes. Enteric contrast reaches the terminal ileum. Moderate stool burden. No evidence of obstruction. Vascular/Lymphatic: No significant atherosclerotic changes. Edema within the fat at the liver hilum, surrounding the gallbladder within the right abdomen, with mild edema within the mesenteric fat of the mid abdomen. No adenopathy. Reproductive: Unremarkable uterus. Other: None Musculoskeletal: No acute displaced fracture. IMPRESSION: CT demonstrates acute cholecystitis, with choledocholithiasis of the cystic duct. These results were discussed by telephone at the time of interpretation on 12/14/2018 at 5:05 pm with Dr. Burr Medico. Redemonstration of known liver metastases, with positive response to therapy, interval reduction in size of all lesions, with multiple demonstrating significant internal necrosis. No acute finding of the chest. Ancillary findings as above. Electronically Signed: By: Corrie Mckusick D.O. On: 12/14/2018 17:12   Ct Abdomen Pelvis W Contrast  Addendum Date: 12/14/2018   ADDENDUM REPORT: 12/14/2018 19:17 ADDENDUM: Addendum created for clarification of findings related to the gallbladder.  Distended gallbladder with inflammatory changes compatible with acute cholecystitis. The obstructive calcified stone is likely in the infundibulum/proximal cystic duct, not within common bile duct. No calcified stones are visualized within the common bile duct. Findings discussed  with Dr. Johney Maine of general surgery. Electronically Signed   By: Corrie Mckusick D.O.   On: 12/14/2018 19:17   Result Date: 12/14/2018 CLINICAL DATA:  58 year old female with a history of metastatic pancreatic cancer with right upper quadrant pain and fever EXAM: CT CHEST, ABDOMEN, AND PELVIS WITH CONTRAST TECHNIQUE: Multidetector CT imaging of the chest, abdomen and pelvis was performed following the standard protocol during bolus administration of intravenous contrast. CONTRAST:  156mL OMNIPAQUE IOHEXOL 300 MG/ML  SOLN COMPARISON:  Multiple prior PET CT most recent 09/01/2018, most recent CT abdomen 10/14/2017 FINDINGS: CT CHEST FINDINGS Cardiovascular: Heart size within normal limits. No pericardial fluid/thickening. Unremarkable course caliber contour of the thoracic aorta. Unremarkable diameter of the main pulmonary artery. No proximal filling defects. No significant atherosclerosis. Port catheter on the right chest wall with IJ approach. Mediastinum/Nodes: No adenopathy. Unremarkable appearance of the thoracic esophagus. Unremarkable thoracic inlet. Lungs/Pleura: No pneumothorax or pleural effusion. No confluent airspace disease. Atelectatic changes/scarring bilateral lungs, predominantly in the dependent lung bases, lingula, right middle lobe. Musculoskeletal: No acute displaced fracture. No significant degenerative changes. No bony canal narrowing. CT ABDOMEN PELVIS FINDINGS Hepatobiliary: There are treatment changes within the liver, with multiple regions of necrotic/cystic change, new from the baseline CT of 07/23/2017. Compared to the most recent PET/CT they are has been significant interval decrease in size of all of the lesions previously noted, involving all liver segments. Index lesion in segment 4 B measures 5.8 cm with internal necrosis. Index lesion within segment 5/6 measuring 5.7 cm, with internal necrosis. Index lesion segment 8 measures 3.2 cm with internal necrosis. Hypervascular region on the  margin of a treated lesion within segment 5 (image 122 of series 3) likely perfusion anomaly/Thad. Distention of the gallbladder with gallbladder wall thickening and multiple intrahepatic stones. Calcified stone in the hilum of the liver, likely within the cystic duct. No calcified stone is identified within the distal common bile duct. Pancreas: Unchanged appearance of the pancreas with mild dilation of the pancreatic duct in the body of the pancreas. No inflammatory changes or calcified parenchyma. Spleen: Unremarkable spleen Adrenals/Urinary Tract: Unremarkable appearance of the adrenal glands. No evidence of hydronephrosis of the right or left kidney. No nephrolithiasis. Unremarkable course of the bilateral ureters. Unremarkable appearance of the urinary bladder. Stomach/Bowel: Unremarkable stomach. Small bowel unremarkable without abnormal distention or focal inflammatory changes. Enteric contrast reaches the terminal ileum. Moderate stool burden. No evidence of obstruction. Vascular/Lymphatic: No significant atherosclerotic changes. Edema within the fat at the liver hilum, surrounding the gallbladder within the right abdomen, with mild edema within the mesenteric fat of the mid abdomen. No adenopathy. Reproductive: Unremarkable uterus. Other: None Musculoskeletal: No acute displaced fracture. IMPRESSION: CT demonstrates acute cholecystitis, with choledocholithiasis of the cystic duct. These results were discussed by telephone at the time of interpretation on 12/14/2018 at 5:05 pm with Dr. Burr Medico. Redemonstration of known liver metastases, with positive response to therapy, interval reduction in size of all lesions, with multiple demonstrating significant internal necrosis. No acute finding of the chest. Ancillary findings as above. Electronically Signed: By: Corrie Mckusick D.O. On: 12/14/2018 17:12   Ir Perc Cholecystostomy  Result Date: 12/15/2018 INDICATION: 58 year old with metastatic pancreatic cancer and  acute cholecystitis. Patient is not a surgical candidate. EXAM: PLACEMENT OF  CHOLECYSTOSTOMY TUBE WITH ULTRASOUND AND FLUOROSCOPIC GUIDANCE MEDICATIONS: Inpatient receiving scheduled IV antibiotics. ANESTHESIA/SEDATION: Moderate (conscious) sedation was employed during this procedure. A total of Versed 2.0 mg and Fentanyl 100 mcg was administered intravenously. Moderate Sedation Time: 18 minutes minutes. The patient's level of consciousness and vital signs were monitored continuously by radiology nursing throughout the procedure under my direct supervision. FLUOROSCOPY TIME:  Fluoroscopy Time: 54 seconds, 3 mGy CONTRAST:  5 mL Omnipaque 063 COMPLICATIONS: None immediate. PROCEDURE: Informed written consent was obtained from the patient after a thorough discussion of the procedural risks, benefits and alternatives. All questions were addressed. Maximal Sterile Barrier Technique was utilized including caps, mask, sterile gowns, sterile gloves, sterile drape, hand hygiene and skin antiseptic. A timeout was performed prior to the initiation of the procedure. Right abdomen was prepped and draped in sterile fashion. Ultrasound was used to identify an abnormal gallbladder. Skin was anesthetized with 1% lidocaine. Small skin incision was made. 21 gauge needle was directed into the gallbladder with ultrasound guidance. 0.018 wire was advanced into the gallbladder and a transitional dilator set was placed. Stiff Amplatz wire was advanced into the gallbladder and the tract was dilated to accommodate a 10.2 [place] multipurpose drain. 80 mL of bloody bilious thick fluid was removed from the gallbladder. Fluid sample sent for culture. Catheter was sutured to skin and attached to a gravity bag. FINDINGS: Gallbladder is markedly abnormal. Gallbladder is distended with wall thickening. Extensive echogenic material in the gallbladder compatible with stones and sludge. Small amount of pericholecystic fluid or ascites. 10.2 French  drain was successfully advanced into the gallbladder. 80 mL of bloody bilious fluid was removed from the gallbladder. IMPRESSION: Successful image guided cholecystostomy tube placement. Electronically Signed   By: Markus Daft M.D.   On: 12/15/2018 15:00   Vas [place] Lower Extremity Venous (dvt)  Result Date: 12/15/2018  Lower Venous Study Indications: Edema. Left greater than right  Risk Factors: Cancer Pancreatic. Performing Technologist: Toma Copier RVS  Examination Guidelines: A complete evaluation includes B-mode imaging, spectral Doppler, color Doppler, and power Doppler as needed of all accessible portions of each vessel. Bilateral testing is considered an integral part of a complete examination. Limited examinations for reoccurring indications may be performed as noted.  +-----+---------------+---------+-----------+----------+-------+  RIGHT Compressibility Phasicity Spontaneity Properties Summary  +-----+---------------+---------+-----------+----------+-------+  CFV   Full            Yes       Yes                             +-----+---------------+---------+-----------+----------+-------+  SFJ   Full                                                      +-----+---------------+---------+-----------+----------+-------+   +---------+---------------+---------+-----------+----------+------------------+  LEFT      Compressibility Phasicity Spontaneity Properties Summary             +---------+---------------+---------+-----------+----------+------------------+  CFV       Full            Yes       Yes                                        +---------+---------------+---------+-----------+----------+------------------+  SFJ       Full                                                                 +---------+---------------+---------+-----------+----------+------------------+  FV Prox   Full            Yes       Yes                                         +---------+---------------+---------+-----------+----------+------------------+  FV Mid    Full                                                                 +---------+---------------+---------+-----------+----------+------------------+  FV Distal Full            Yes       Yes                                        +---------+---------------+---------+-----------+----------+------------------+  PFV       Full            Yes       Yes                                        +---------+---------------+---------+-----------+----------+------------------+  POP       Full            Yes       Yes                                        +---------+---------------+---------+-----------+----------+------------------+  PTV       Partial                                          Acute               +---------+---------------+---------+-----------+----------+------------------+  PERO      Full                                             Difficult to image  +---------+---------------+---------+-----------+----------+------------------+   Left Technical Findings: Acute DVT noted in one of the paired posterior tibial veins in the distal calf   Summary: Right: There is no evidence of a common femoral vein obstruction. Left: Findings consistent with acute deep vein thrombosis involving the left posterior tibial vein. See technical findings listed above  *See table(s) above for measurements and observations. Electronically signed by Servando Snare MD  on 12/15/2018 at 5:10:58 PM.    Final     Labs:  CBC: Recent Labs    12/12/18 1056 12/14/18 1317 12/15/18 0841 12/16/18 0826  WBC 2.0* 7.4 5.9   5.9 4.0  HGB 8.8* 8.9* 7.1*   7.2* 6.5*  HCT 26.8* 26.3* 21.4*   21.3* 20.1*  PLT 120* 132* 93*   90* 91*    COAGS: Recent Labs    12/15/18 0941  INR 1.0  APTT 46*    BMP: Recent Labs    12/12/18 1056 12/14/18 1317 12/15/18 0841 12/16/18 0826  NA 138 135 135   135 137  K 3.8 3.5 3.2*   3.3* 3.4*  CL 103 99 100    100 103  CO2 27 27 28   28 26   GLUCOSE 110* 147* 98   97 100*  BUN 21* 23* 22*   22* 17  CALCIUM 9.3 9.0 8.6*   8.6* 8.5*  CREATININE 1.04* 1.14* 0.96   0.99 0.99  GFRNONAA >60 54* >60   >60 >60  GFRAA >60 >60 >60   >60 >60    LIVER FUNCTION TESTS: Recent Labs    12/12/18 1056 12/14/18 1317 12/15/18 0841 12/16/18 0826  BILITOT 0.5 1.5* 1.9*   1.8* 1.1  AST 48* 55* 38   38 37  ALT 53* 57* 45*   45* 42  ALKPHOS 317* 354* 221*   227* 255*  PROT 7.7 7.7 6.5   6.6 6.2*  ALBUMIN 3.4* 3.4* 3.0*   3.0* 2.9*    Assessment and Plan: Patient with history of metastatic pancreatic cancer to the liver, abdominal pain, acute cholecystitis, status post percutaneous cholecystostomy on 12/15/2018; also with left posterior tibial DVT ,currently on IV heparin.  Afebrile, WBC normal, hemoglobin 6.5 -currently receiving transfusion ;creatinine normal, total bilirubin 1.1, platelets 91k, bile/blood/urine cultures pending; continue drain irrigation twice daily and output monitoring; drain will need to remain in place at least 4 to 6 weeks unless cholecystectomy done in interim.  Consider brief hold of IV heparin should bleeding continue from gallbladder drain (plans noted for eventual transition to Eliquis prior to discharge) as well as f/u LE venous doppler before commiting to long term anticoagulation due to only distal calf involvement.   Electronically Signed: D. Rowe Robert, PA-C 12/16/2018, 11:25 AM   I spent a total of 15 minutes at the the patient's bedside AND on the patient's hospital floor or unit, greater than 50% of which was counseling/coordinating care for gallbladder drain    Patient ID: Morgan Dawson, female   DOB: [birth year], 58 y.o.   MRN: [id number]

## 2018-12-16 NOTE — Progress Notes (Signed)
PROGRESS NOTE    Morgan Dawson  URK:[id number] DOB: Oct 06, 1960 DOA: 12/14/2018 PCP: [name], MD     Brief Narrative:  Morgan Dawson is a 58 year old female with medical history significant for pancreatic cancer with metastases to the liver, currently on chemotherapy.  She presents with 2-day history of abdominal pain, low-grade fevers.  She admits to abdominal pain radiating to her back, worse in the right upper quadrant.  Denies any nausea or diarrhea.  She presented to the oncology office, underwent CT of the abdomen and pelvis which revealed cholecystitis.  She was referred to the emergency department at that time.  Patient admitted for acute cholecystitis, started on IV Zosyn with general surgery consulted.  Patient underwent percutaneous cholecystostomy tube placement by IR 4/23.  New events last 24 hours / Subjective: Feeling well overall, continues to have some right upper quadrant abdominal pain.  Percutaneous chole tube draining sanguinous fluid.  Has been walking the halls without any issue.  Assessment & Plan:   Principal Problem:   Acute calculous cholecystitis Active Problems:   Pancreatic cancer ([place])   Pancreatic cancer metastasized to liver ([place])   Port-A-Cath in place   CKD (chronic kidney disease), stage III ([place])   Anemia due to antineoplastic chemotherapy   Hypokalemia   Thrombocytopenia ([facility])   Vertigo   Meniere's disease of right ear   Liver metastases - presumed pancreatic cancer primary   Cholelithiasis - calcified gallstones   On antineoplastic chemotherapy   Inguinal hernia, left   Hemangioma of liver, right lobe segment 5   Leg edema, left   Acute calculous cholecystitis General surgery following S/p percutaneous cholecystostomy tube placement by IR 4/23.  Removed 80 mL thick bloody bilious fluid, fluid culture pending Continue IV Zosyn  Pancreatic cancer with mets to the liver Followed by Dr. Burr Medico, oncology  Left lower leg DVT Currently  on IV heparin, maintain on IV heparin until hemoglobin stabilizes.  Plan to transition to Eliquis prior to discharge as long as hemoglobin remains stable  Acute blood loss anemia on chronic macrocytic anemia Baseline hemoglobin around 9, noted also to be pancytopenic while on chemotherapy Hemoglobin this morning 6.5, transfuse 1 unit packed red blood cells.  Patient remains on IV heparin  CKD stage III Creatinine remains stable  Hypokalemia Replace, trend  Elevated liver enzymes Resolved  Elevated TSH Repeat labs as an outpatient in several weeks   DVT prophylaxis: IV heparin  Code Status: Full Family Communication: None Disposition Plan: Transfuse 1 unit packed red blood cells, monitor H&H.  Remains on IV Zosyn.  Continue to monitor cholecystostomy tube drainage.  Remains on IV heparin for now   Consultants:   General surgery  Oncology  IR  Procedures:   S/p percutaneous cholecystostomy tube placement by IR 4/23.  Removed 80 mL thick bloody bilious fluid  Antimicrobials:  Anti-infectives (From admission, onward)   Start     Dose/Rate Route Frequency Ordered Stop   12/15/18 0200  piperacillin-tazobactam (ZOSYN) IVPB 3.375 g     3.375 g 12.5 mL/hr over 240 Minutes Intravenous Every 8 hours 12/14/18 1910     12/14/18 1900  piperacillin-tazobactam (ZOSYN) IVPB 3.375 g     3.375 g 100 mL/hr over 30 Minutes Intravenous  Once 12/14/18 1845 12/14/18 2028       Objective: Vitals:   12/15/18 1523 12/15/18 1700 12/15/18 2055 12/16/18 0558  BP: (!) 91/58 97/62 101/65 (!) 94/58  Pulse:   81 75  Resp: 18  17  17  Temp:   98.8 F (37.1 C) 98.2 F (36.8 C)  TempSrc:      SpO2:   99% 99%  Weight:      Height:        Intake/Output Summary (Last 24 hours) at 12/16/2018 0937 Last data filed at 12/16/2018 0553 Gross per 24 hour  Intake 1405.18 ml  Output 500 ml  Net 905.18 ml   Filed Weights   12/14/18 2044  Weight: 47.2 kg    Examination: General exam: Appears  calm and comfortable, thin Respiratory system: Clear to auscultation. Respiratory effort normal. Cardiovascular system: S1 & S2 heard, RRR. No JVD, murmurs, rubs, gallops or clicks. No pedal edema. Gastrointestinal system: Abdomen is nondistended, soft and tender to palpation right upper quadrant.  Chole drain with sanguinous fluid Central nervous system: Alert and oriented. No focal neurological deficits. Extremities: Symmetric 5 x 5 power. Skin: No rashes, lesions or ulcers Psychiatry: Judgement and insight appear normal. Mood & affect appropriate.   Data Reviewed: I have personally reviewed following labs and imaging studies  CBC: Recent Labs  Lab 12/12/18 1056 12/14/18 1317 12/15/18 0841 12/16/18 0826  WBC 2.0* 7.4 5.9   5.9 4.0  NEUTROABS 1.1* 7.1  --   --   HGB 8.8* 8.9* 7.1*   7.2* 6.5*  HCT 26.8* 26.3* 21.4*   21.3* 20.1*  MCV 106.8* 102.7* 104.4*   104.4* 105.8*  PLT 120* 132* 93*   90* 91*   Basic Metabolic Panel: Recent Labs  Lab 12/12/18 1056 12/14/18 1317 12/15/18 0841 12/16/18 0826  NA 138 135 135   135 137  K 3.8 3.5 3.2*   3.3* 3.4*  CL 103 99 100   100 103  CO2 27 27 28   28 26   GLUCOSE 110* 147* 98   97 100*  BUN 21* 23* 22*   22* 17  CREATININE 1.04* 1.14* 0.96   0.99 0.99  CALCIUM 9.3 9.0 8.6*   8.6* 8.5*  MG  --   --  1.7  --   PHOS  --   --  3.6  --    GFR: Estimated Creatinine Clearance: 47.3 mL/min (by C-G formula based on SCr of 0.99 mg/dL). Liver Function Tests: Recent Labs  Lab 12/12/18 1056 12/14/18 1317 12/15/18 0841 12/16/18 0826  AST 48* 55* 38   38 37  ALT 53* 57* 45*   45* 42  ALKPHOS 317* 354* 221*   227* 255*  BILITOT 0.5 1.5* 1.9*   1.8* 1.1  PROT 7.7 7.7 6.5   6.6 6.2*  ALBUMIN 3.4* 3.4* 3.0*   3.0* 2.9*   Recent Labs  Lab 12/14/18 1332  LIPASE 65*  AMYLASE 53   No results for input(s): AMMONIA in the last 168 hours. Coagulation Profile: Recent Labs  Lab 12/15/18 0941  INR 1.0   Cardiac Enzymes: No results  for input(s): CKTOTAL, CKMB, CKMBINDEX, TROPONINI in the last 168 hours. BNP (last 3 results) No results for input(s): PROBNP in the last 8760 hours. HbA1C: No results for input(s): HGBA1C in the last 72 hours. CBG: No results for input(s): GLUCAP in the last 168 hours. Lipid Profile: No results for input(s): CHOL, HDL, LDLCALC, TRIG, CHOLHDL, LDLDIRECT in the last 72 hours. Thyroid Function Tests: Recent Labs    12/15/18 0841  TSH 13.948*   Anemia Panel: No results for input(s): VITAMINB12, FOLATE, FERRITIN, TIBC, IRON, RETICCTPCT in the last 72 hours. Sepsis Labs: No results for input(s): PROCALCITON, LATICACIDVEN in  the last 168 hours.  Recent Results (from the past 240 hour(s))  Culture, Blood     Status: None (Preliminary result)   Collection Time: 12/14/18  1:27 PM  Result Value Ref Range Status   Specimen Description   Final    BLOOD LEFT ARM Performed at [facility], [address], [place], [place] [address]    Special Requests   Final    BOTTLES DRAWN AEROBIC AND ANAEROBIC Blood Culture results may not be optimal due to an excessive volume of blood received in culture bottles   Culture   Final    NO GROWTH 2 DAYS Performed at [facility], [place] [address] [place], [place] [address]    Report Status PENDING  Incomplete  Culture, Blood     Status: None (Preliminary result)   Collection Time: 12/14/18  1:50 PM  Result Value Ref Range Status   Specimen Description BLOOD PORTA CATH  Final   Special Requests   Final    BOTTLES DRAWN AEROBIC AND ANAEROBIC Blood Culture adequate volume   Culture   Final    NO GROWTH 2 DAYS Performed at [facility], [place] [address], [place], [place] [address]    Report Status PENDING  Incomplete  Urine Culture     Status: Abnormal   Collection Time: 12/14/18  2:38 PM  Result Value Ref Range Status   Specimen Description   Final    URINE, CLEAN CATCH Performed at [facility], [place] [address] [place], [place] [address]    Special Requests   Final    NONE Performed at [facility], [place] [address], [place], [place] [address]    Culture (A)  Final    <10,000 COLONIES/mL INSIGNIFICANT GROWTH Performed at [employer] [address], [place], [place] [address]    Report Status 12/15/2018 FINAL  Final  Aerobic/Anaerobic Culture (surgical/deep wound)     Status: None (Preliminary result)   Collection Time: 12/15/18  2:57 PM  Result Value Ref Range Status   Specimen Description   Final    BILE Performed at [place] [address], [place], [place] [address]    Special Requests   Final    NONE Performed at [facility], [place] [address] [place], [place] [address]    Gram Stain   Final    ABUNDANT WBC PRESENT, PREDOMINANTLY PMN NO ORGANISMS SEEN Performed at [facility], [place] [address] [place], [place] [address]    Culture PENDING  Incomplete   Report Status PENDING  Incomplete       Radiology Studies: Dg Chest 2 View  Result Date: 12/14/2018 CLINICAL DATA:  Abdominal pain EXAM: CHEST - 2 VIEW COMPARISON:  09/01/2018 PET FINDINGS: Right jugular Port-A-Cath with its tip at the cavoatrial junction is stable. Normal heart size. Clear lungs. No pneumothorax or pleural effusion. IMPRESSION: No active cardiopulmonary disease. Electronically Signed   By: Marybelle Killings M.D.   On: 12/14/2018 16:08   Ct Chest W Contrast  Addendum Date: 12/14/2018   ADDENDUM REPORT: 12/14/2018 19:17 ADDENDUM: Addendum created for clarification of findings related to the gallbladder. Distended gallbladder with inflammatory changes compatible with acute cholecystitis. The obstructive calcified stone is likely in the infundibulum/proximal cystic duct, not within common bile duct. No calcified stones are visualized within the common bile duct. Findings discussed with Dr. Johney Maine of general surgery.  Electronically Signed  By: Corrie Mckusick D.O.   On: 12/14/2018 19:17   Result Date: 12/14/2018 CLINICAL DATA:  58 year old female with a history of metastatic pancreatic cancer with right upper quadrant pain and fever EXAM: CT CHEST, ABDOMEN, AND PELVIS WITH CONTRAST TECHNIQUE: Multidetector CT imaging of the chest, abdomen and pelvis was performed following the standard protocol during bolus administration of intravenous contrast. CONTRAST:  144mL OMNIPAQUE IOHEXOL 300 MG/ML  SOLN COMPARISON:  Multiple prior PET CT most recent 09/01/2018, most recent CT abdomen 10/14/2017 FINDINGS: CT CHEST FINDINGS Cardiovascular: Heart size within normal limits. No pericardial fluid/thickening. Unremarkable course caliber contour of the thoracic aorta. Unremarkable diameter of the main pulmonary artery. No proximal filling defects. No significant atherosclerosis. Port catheter on the right chest wall with IJ approach. Mediastinum/Nodes: No adenopathy. Unremarkable appearance of the thoracic esophagus. Unremarkable thoracic inlet. Lungs/Pleura: No pneumothorax or pleural effusion. No confluent airspace disease. Atelectatic changes/scarring bilateral lungs, predominantly in the dependent lung bases, lingula, right middle lobe. Musculoskeletal: No acute displaced fracture. No significant degenerative changes. No bony canal narrowing. CT ABDOMEN PELVIS FINDINGS Hepatobiliary: There are treatment changes within the liver, with multiple regions of necrotic/cystic change, new from the baseline CT of 07/23/2017. Compared to the most recent PET/CT they are has been significant interval decrease in size of all of the lesions previously noted, involving all liver segments. Index lesion in segment 4 B measures 5.8 cm with internal necrosis. Index lesion within segment 5/6 measuring 5.7 cm, with internal necrosis. Index lesion segment 8 measures 3.2 cm with internal necrosis. Hypervascular region on the margin of a treated lesion within  segment 5 (image 122 of series 3) likely perfusion anomaly/Thad. Distention of the gallbladder with gallbladder wall thickening and multiple intrahepatic stones. Calcified stone in the hilum of the liver, likely within the cystic duct. No calcified stone is identified within the distal common bile duct. Pancreas: Unchanged appearance of the pancreas with mild dilation of the pancreatic duct in the body of the pancreas. No inflammatory changes or calcified parenchyma. Spleen: Unremarkable spleen Adrenals/Urinary Tract: Unremarkable appearance of the adrenal glands. No evidence of hydronephrosis of the right or left kidney. No nephrolithiasis. Unremarkable course of the bilateral ureters. Unremarkable appearance of the urinary bladder. Stomach/Bowel: Unremarkable stomach. Small bowel unremarkable without abnormal distention or focal inflammatory changes. Enteric contrast reaches the terminal ileum. Moderate stool burden. No evidence of obstruction. Vascular/Lymphatic: No significant atherosclerotic changes. Edema within the fat at the liver hilum, surrounding the gallbladder within the right abdomen, with mild edema within the mesenteric fat of the mid abdomen. No adenopathy. Reproductive: Unremarkable uterus. Other: None Musculoskeletal: No acute displaced fracture. IMPRESSION: CT demonstrates acute cholecystitis, with choledocholithiasis of the cystic duct. These results were discussed by telephone at the time of interpretation on 12/14/2018 at 5:05 pm with Dr. Burr Medico. Redemonstration of known liver metastases, with positive response to therapy, interval reduction in size of all lesions, with multiple demonstrating significant internal necrosis. No acute finding of the chest. Ancillary findings as above. Electronically Signed: By: Corrie Mckusick D.O. On: 12/14/2018 17:12   Ct Abdomen Pelvis W Contrast  Addendum Date: 12/14/2018   ADDENDUM REPORT: 12/14/2018 19:17 ADDENDUM: Addendum created for clarification of  findings related to the gallbladder. Distended gallbladder with inflammatory changes compatible with acute cholecystitis. The obstructive calcified stone is likely in the infundibulum/proximal cystic duct, not within common bile duct. No calcified stones are visualized within the common bile duct. Findings discussed with Dr. Johney Maine of general surgery. Electronically Signed  By: Corrie Mckusick D.O.   On: 12/14/2018 19:17   Result Date: 12/14/2018 CLINICAL DATA:  58 year old female with a history of metastatic pancreatic cancer with right upper quadrant pain and fever EXAM: CT CHEST, ABDOMEN, AND PELVIS WITH CONTRAST TECHNIQUE: Multidetector CT imaging of the chest, abdomen and pelvis was performed following the standard protocol during bolus administration of intravenous contrast. CONTRAST:  16mL OMNIPAQUE IOHEXOL 300 MG/ML  SOLN COMPARISON:  Multiple prior PET CT most recent 09/01/2018, most recent CT abdomen 10/14/2017 FINDINGS: CT CHEST FINDINGS Cardiovascular: Heart size within normal limits. No pericardial fluid/thickening. Unremarkable course caliber contour of the thoracic aorta. Unremarkable diameter of the main pulmonary artery. No proximal filling defects. No significant atherosclerosis. Port catheter on the right chest wall with IJ approach. Mediastinum/Nodes: No adenopathy. Unremarkable appearance of the thoracic esophagus. Unremarkable thoracic inlet. Lungs/Pleura: No pneumothorax or pleural effusion. No confluent airspace disease. Atelectatic changes/scarring bilateral lungs, predominantly in the dependent lung bases, lingula, right middle lobe. Musculoskeletal: No acute displaced fracture. No significant degenerative changes. No bony canal narrowing. CT ABDOMEN PELVIS FINDINGS Hepatobiliary: There are treatment changes within the liver, with multiple regions of necrotic/cystic change, new from the baseline CT of 07/23/2017. Compared to the most recent PET/CT they are has been significant interval  decrease in size of all of the lesions previously noted, involving all liver segments. Index lesion in segment 4 B measures 5.8 cm with internal necrosis. Index lesion within segment 5/6 measuring 5.7 cm, with internal necrosis. Index lesion segment 8 measures 3.2 cm with internal necrosis. Hypervascular region on the margin of a treated lesion within segment 5 (image 122 of series 3) likely perfusion anomaly/Thad. Distention of the gallbladder with gallbladder wall thickening and multiple intrahepatic stones. Calcified stone in the hilum of the liver, likely within the cystic duct. No calcified stone is identified within the distal common bile duct. Pancreas: Unchanged appearance of the pancreas with mild dilation of the pancreatic duct in the body of the pancreas. No inflammatory changes or calcified parenchyma. Spleen: Unremarkable spleen Adrenals/Urinary Tract: Unremarkable appearance of the adrenal glands. No evidence of hydronephrosis of the right or left kidney. No nephrolithiasis. Unremarkable course of the bilateral ureters. Unremarkable appearance of the urinary bladder. Stomach/Bowel: Unremarkable stomach. Small bowel unremarkable without abnormal distention or focal inflammatory changes. Enteric contrast reaches the terminal ileum. Moderate stool burden. No evidence of obstruction. Vascular/Lymphatic: No significant atherosclerotic changes. Edema within the fat at the liver hilum, surrounding the gallbladder within the right abdomen, with mild edema within the mesenteric fat of the mid abdomen. No adenopathy. Reproductive: Unremarkable uterus. Other: None Musculoskeletal: No acute displaced fracture. IMPRESSION: CT demonstrates acute cholecystitis, with choledocholithiasis of the cystic duct. These results were discussed by telephone at the time of interpretation on 12/14/2018 at 5:05 pm with Dr. Burr Medico. Redemonstration of known liver metastases, with positive response to therapy, interval reduction in size  of all lesions, with multiple demonstrating significant internal necrosis. No acute finding of the chest. Ancillary findings as above. Electronically Signed: By: Corrie Mckusick D.O. On: 12/14/2018 17:12   Ir Perc Cholecystostomy  Result Date: 12/15/2018 INDICATION: 58 year old with metastatic pancreatic cancer and acute cholecystitis. Patient is not a surgical candidate. EXAM: PLACEMENT OF CHOLECYSTOSTOMY TUBE WITH ULTRASOUND AND FLUOROSCOPIC GUIDANCE MEDICATIONS: Inpatient receiving scheduled IV antibiotics. ANESTHESIA/SEDATION: Moderate (conscious) sedation was employed during this procedure. A total of Versed 2.0 mg and Fentanyl 100 mcg was administered intravenously. Moderate Sedation Time: 18 minutes minutes. The patient's level of consciousness and vital  signs were monitored continuously by radiology nursing throughout the procedure under my direct supervision. FLUOROSCOPY TIME:  Fluoroscopy Time: 54 seconds, 3 mGy CONTRAST:  5 mL Omnipaque [id number] COMPLICATIONS: None immediate. PROCEDURE: Informed written consent was obtained from the patient after a thorough discussion of the procedural risks, benefits and alternatives. All questions were addressed. Maximal Sterile Barrier Technique was utilized including caps, mask, sterile gowns, sterile gloves, sterile drape, hand hygiene and skin antiseptic. A timeout was performed prior to the initiation of the procedure. Right abdomen was prepped and draped in sterile fashion. Ultrasound was used to identify an abnormal gallbladder. Skin was anesthetized with 1% lidocaine. Small skin incision was made. 21 gauge needle was directed into the gallbladder with ultrasound guidance. 0.018 wire was advanced into the gallbladder and a transitional dilator set was placed. Stiff Amplatz wire was advanced into the gallbladder and the tract was dilated to accommodate a 10.2 [place] multipurpose drain. 80 mL of bloody bilious thick fluid was removed from the gallbladder. Fluid sample  sent for culture. Catheter was sutured to skin and attached to a gravity bag. FINDINGS: Gallbladder is markedly abnormal. Gallbladder is distended with wall thickening. Extensive echogenic material in the gallbladder compatible with stones and sludge. Small amount of pericholecystic fluid or ascites. 10.2 French drain was successfully advanced into the gallbladder. 80 mL of bloody bilious fluid was removed from the gallbladder. IMPRESSION: Successful image guided cholecystostomy tube placement. Electronically Signed   By: Markus Daft M.D.   On: 12/15/2018 15:00   Vas [place] Lower Extremity Venous (dvt)  Result Date: 12/15/2018  Lower Venous Study Indications: Edema. Left greater than right  Risk Factors: Cancer Pancreatic. Performing Technologist: Toma Copier RVS  Examination Guidelines: A complete evaluation includes B-mode imaging, spectral Doppler, color Doppler, and power Doppler as needed of all accessible portions of each vessel. Bilateral testing is considered an integral part of a complete examination. Limited examinations for reoccurring indications may be performed as noted.  +-----+---------------+---------+-----------+----------+-------+  RIGHT Compressibility Phasicity Spontaneity Properties Summary  +-----+---------------+---------+-----------+----------+-------+  CFV   Full            Yes       Yes                             +-----+---------------+---------+-----------+----------+-------+  SFJ   Full                                                      +-----+---------------+---------+-----------+----------+-------+   +---------+---------------+---------+-----------+----------+------------------+  LEFT      Compressibility Phasicity Spontaneity Properties Summary             +---------+---------------+---------+-----------+----------+------------------+  CFV       Full            Yes       Yes                                         +---------+---------------+---------+-----------+----------+------------------+  SFJ       Full                                                                 +---------+---------------+---------+-----------+----------+------------------+  FV Prox   Full            Yes       Yes                                        +---------+---------------+---------+-----------+----------+------------------+  FV Mid    Full                                                                 +---------+---------------+---------+-----------+----------+------------------+  FV Distal Full            Yes       Yes                                        +---------+---------------+---------+-----------+----------+------------------+  PFV       Full            Yes       Yes                                        +---------+---------------+---------+-----------+----------+------------------+  POP       Full            Yes       Yes                                        +---------+---------------+---------+-----------+----------+------------------+  PTV       Partial                                          Acute               +---------+---------------+---------+-----------+----------+------------------+  PERO      Full                                             Difficult to image  +---------+---------------+---------+-----------+----------+------------------+   Left Technical Findings: Acute DVT noted in one of the paired posterior tibial veins in the distal calf   Summary: Right: There is no evidence of a common femoral vein obstruction. Left: Findings consistent with acute deep vein thrombosis involving the left posterior tibial vein. See technical findings listed above  *See table(s) above for measurements and observations. Electronically signed by Servando Snare MD on 12/15/2018 at 5:10:58 PM.    Final       Scheduled Meds:  sodium chloride   Intravenous Once   gabapentin  100 mg Oral BID   lip balm  1 application Topical BID    potassium chloride  40 mEq Oral Once   sodium chloride flush  10-40 mL Intracatheter Q12H   Continuous Infusions:  sodium chloride 250 mL (12/15/18 2206)   heparin  1,050 Units/hr (12/16/18 0028)   lactated ringers     methocarbamol (ROBAXIN) IV     ondansetron (ZOFRAN) IV     piperacillin-tazobactam (ZOSYN)  IV 3.375 g (12/16/18 0902)     LOS: 2 days    Time spent: 25 minutes   Dessa Phi, DO [facility] [email or web address] 12/16/2018, 9:37 AM

## 2018-12-16 NOTE — Progress Notes (Signed)
ANTICOAGULATION CONSULT NOTE - Initial Consult  Pharmacy Consult for IV heparin Indication: New DVT  No Known Allergies  Patient Measurements: Height: 5\' 5"  (165.1 cm) Weight: 104 lb 0.9 oz (47.2 kg) IBW/kg (Calculated) : 57 Heparin Dosing Weight: 47  Vital Signs: Temp: 98.2 F (36.8 C) (04/24 0558) BP: 94/58 (04/24 0558) Pulse Rate: 75 (04/24 0558)  Labs: Recent Labs    12/14/18 1317 12/15/18 0841 12/15/18 0941 12/15/18 2258 12/16/18 0826  HGB 8.9* 7.1*  7.2*  --   --  6.5*  HCT 26.3* 21.4*  21.3*  --   --  20.1*  PLT 132* 93*  90*  --   --  91*  APTT  --   --  46*  --   --   LABPROT  --   --  13.4  --   --   INR  --   --  1.0  --   --   HEPARINUNFRC  --   --   --  0.10* 0.30  CREATININE 1.14* 0.96  0.99  --   --  0.99    Estimated Creatinine Clearance: 47.3 mL/min (by C-G formula based on SCr of 0.99 mg/dL).   Medical History: Past Medical History:  Diagnosis Date  . Diarrhea of presumed infectious origin 01/12/2018  . Gastroenteritis 01/12/2018  . Genetic testing 12/02/2017   Multi-Cancer panel (83 genes) @ [facility] - No pathogenic mutations detected  . Meniere disease 2016  . Pancreatitis    Assessment: 58 yo female with metastatic pancreatic cancer - most recent chemotherapy on 12/12/18. Patient diagnosed with new left lower extremity DVT during hospitalization. Patient underwent percutaneous cholecystostomy tube placement on 4/23 and heparin drip was initiated post-procedure.   Baseline labs low likely from chemotherapy with Hgb of 7.1 and Plt of 90 - monitor closely  Today, 12/16/18  HL - 0.30 is therapeutic on heparin infusion of 1050 units/hr  Hgb 6.5 is low and slightly decreased. Pt pancytopenic s/p chemotherapy.  MD aware of low Hgb: per note, transfuse 1 unit PRBC and continue IV heparin until hemoglobin stabilizes and then likely transition to apixaban  Plt (91) - low but stable  Confirmed with RN that heparin running at correct rate and  no signs/symptoms of bleeding or bruising at this time.  Goal of Therapy:  Heparin level 0.3-0.7   Plan:   Continue heparin infusion at current rate of 1,050 units/hr  Check confirmatory HL in 6 hours  Monitor CBC and HL daily   Monitor closely for any signs/symptoms of bleeding  Lenis Noon, PharmD 12/16/2018,9:58 AM

## 2018-12-16 NOTE — Progress Notes (Signed)
Morgan Dawson   DOB:September 07, 1960   JM#:[id number]   QIW#:[id number]   Oncology follow up   Subjective: pt still has some pain in right upper quadrant of abdomen, she has been having bloody biliary drainage since the procedure yesterday.  She was found to have a left lower extremity DVT, currently on heparin.  No other signs of bleeding.  Afebrile, vital signs stable.  She is tolerating liquids, jelly and pudding well.    Objective:  Vitals:   12/16/18 1125 12/16/18 1335  BP: 103/72 97/64  Pulse: 83 82  Resp: 17 20  Temp: 98.7 F (37.1 C) 99.1 F (37.3 C)  SpO2: 99% 98%    Body mass index is 17.32 kg/m.  Intake/Output Summary (Last 24 hours) at 12/16/2018 1621 Last data filed at 12/16/2018 1500 Gross per 24 hour  Intake 1993.69 ml  Output 500 ml  Net 1493.69 ml     Sclerae unicteric  Oropharynx clear  No peripheral adenopathy  Lungs clear -- no rales or rhonchi  Heart regular rate and rhythm  Abdomen benign  MSK no focal spinal tenderness, no peripheral edema  Neuro nonfocal   CBG (last 3)  Recent Labs    12/15/18 2058  GLUCAP 94     Labs:  Urine Studies No results for input(s): UHGB, CRYS in the last 72 hours.  Invalid input(s): UACOL, UAPR, USPG, UPH, UTP, UGL, UKET, UBIL, UNIT, UROB, [place], UEPI, UWBC, Junie Panning [place], [place], [place]  Basic Metabolic Panel: Recent Labs  Lab 12/12/18 1056 12/14/18 1317 12/15/18 0841 12/16/18 0826  NA 138 135 135  135 137  K 3.8 3.5 3.2*  3.3* 3.4*  CL 103 99 100  100 103  CO2 27 27 28  28 26   GLUCOSE 110* 147* 98  97 100*  BUN 21* 23* 22*  22* 17  CREATININE 1.04* 1.14* 0.96  0.99 0.99  CALCIUM 9.3 9.0 8.6*  8.6* 8.5*  MG  --   --  1.7  --   PHOS  --   --  3.6  --    GFR Estimated Creatinine Clearance: 47.3 mL/min (by C-G formula based on SCr of 0.99 mg/dL). Liver Function Tests: Recent Labs  Lab 12/12/18 1056 12/14/18 1317 12/15/18 0841 12/16/18 0826  AST 48* 55* 38  38 37  ALT 53* 57* 45*  45* 42   ALKPHOS 317* 354* 221*  227* 255*  BILITOT 0.5 1.5* 1.9*  1.8* 1.1  PROT 7.7 7.7 6.5  6.6 6.2*  ALBUMIN 3.4* 3.4* 3.0*  3.0* 2.9*   Recent Labs  Lab 12/14/18 1332  LIPASE 65*  AMYLASE 53   No results for input(s): AMMONIA in the last 168 hours. Coagulation profile Recent Labs  Lab 12/15/18 0941  INR 1.0    CBC: Recent Labs  Lab 12/12/18 1056 12/14/18 1317 12/15/18 0841 12/16/18 0826  WBC 2.0* 7.4 5.9  5.9 4.0  NEUTROABS 1.1* 7.1  --   --   HGB 8.8* 8.9* 7.1*  7.2* 6.5*  HCT 26.8* 26.3* 21.4*  21.3* 20.1*  MCV 106.8* 102.7* 104.4*  104.4* 105.8*  PLT 120* 132* 93*  90* 91*   Cardiac Enzymes: No results for input(s): CKTOTAL, CKMB, CKMBINDEX, TROPONINI in the last 168 hours. BNP: Invalid input(s): POCBNP CBG: Recent Labs  Lab 12/15/18 2058  GLUCAP 94   D-Dimer No results for input(s): DDIMER in the last 72 hours. Hgb A1c No results for input(s): HGBA1C in the last 72 hours. Lipid Profile No results  for input(s): CHOL, HDL, LDLCALC, TRIG, CHOLHDL, LDLDIRECT in the last 72 hours. Thyroid function studies Recent Labs    12/15/18 0841  TSH 13.948*   Anemia work up No results for input(s): VITAMINB12, FOLATE, FERRITIN, TIBC, IRON, RETICCTPCT in the last 72 hours. Microbiology Recent Results (from the past 240 hour(s))  Culture, Blood     Status: None (Preliminary result)   Collection Time: 12/14/18  1:27 PM  Result Value Ref Range Status   Specimen Description   Final    BLOOD LEFT ARM Performed at [facility], [address], [place], [place] [address]    Special Requests   Final    BOTTLES DRAWN AEROBIC AND ANAEROBIC Blood Culture results may not be optimal due to an excessive volume of blood received in culture bottles   Culture   Final    NO GROWTH 2 DAYS Performed at [facility], [place] [address] [place], [place] [address]    Report Status PENDING  Incomplete  Culture, Blood     Status: None (Preliminary  result)   Collection Time: 12/14/18  1:50 PM  Result Value Ref Range Status   Specimen Description BLOOD PORTA CATH  Final   Special Requests   Final    BOTTLES DRAWN AEROBIC AND ANAEROBIC Blood Culture adequate volume   Culture   Final    NO GROWTH 2 DAYS Performed at [facility], [place] [address], [place], [place] [address]    Report Status PENDING  Incomplete  Urine Culture     Status: Abnormal   Collection Time: 12/14/18  2:38 PM  Result Value Ref Range Status   Specimen Description   Final    URINE, CLEAN CATCH Performed at [facility], [place] [address] [place], [place] [address]    Special Requests   Final    NONE Performed at [facility], [place] [address] [place], [place] [address]    Culture (A)  Final    <10,000 COLONIES/mL INSIGNIFICANT GROWTH Performed at [place] [address] [place], [place] [address]    Report Status 12/15/2018 FINAL  Final  Aerobic/Anaerobic Culture (surgical/deep wound)     Status: None (Preliminary result)   Collection Time: 12/15/18  2:57 PM  Result Value Ref Range Status   Specimen Description   Final    BILE Performed at [place] [address], [place], [place] [address]    Special Requests   Final    NONE Performed at [facility], [place] [address] [place], [place] [address]    Gram Stain   Final    ABUNDANT WBC PRESENT, PREDOMINANTLY PMN NO ORGANISMS SEEN    Culture   Final    NO GROWTH < 24 HOURS Performed at [place] [address], [place], [place] [address]    Report Status PENDING  Incomplete      Studies:  Ct Chest W Contrast  Addendum Date: 12/14/2018   ADDENDUM REPORT: 12/14/2018 19:17 ADDENDUM: Addendum created for clarification of findings related to the gallbladder. Distended gallbladder with inflammatory changes compatible with acute cholecystitis. The obstructive calcified stone is likely in  the infundibulum/proximal cystic duct, not within common bile duct. No calcified stones are visualized within the common bile duct. Findings discussed with Dr. Johney Maine of general surgery. Electronically Signed   By: Corrie Mckusick D.O.   On: 12/14/2018 19:17   Result Date: 12/14/2018  CLINICAL DATA:  58 year old female with a history of metastatic pancreatic cancer with right upper quadrant pain and fever EXAM: CT CHEST, ABDOMEN, AND PELVIS WITH CONTRAST TECHNIQUE: Multidetector CT imaging of the chest, abdomen and pelvis was performed following the standard protocol during bolus administration of intravenous contrast. CONTRAST:  157mL OMNIPAQUE IOHEXOL 300 MG/ML  SOLN COMPARISON:  Multiple prior PET CT most recent 09/01/2018, most recent CT abdomen 10/14/2017 FINDINGS: CT CHEST FINDINGS Cardiovascular: Heart size within normal limits. No pericardial fluid/thickening. Unremarkable course caliber contour of the thoracic aorta. Unremarkable diameter of the main pulmonary artery. No proximal filling defects. No significant atherosclerosis. Port catheter on the right chest wall with IJ approach. Mediastinum/Nodes: No adenopathy. Unremarkable appearance of the thoracic esophagus. Unremarkable thoracic inlet. Lungs/Pleura: No pneumothorax or pleural effusion. No confluent airspace disease. Atelectatic changes/scarring bilateral lungs, predominantly in the dependent lung bases, lingula, right middle lobe. Musculoskeletal: No acute displaced fracture. No significant degenerative changes. No bony canal narrowing. CT ABDOMEN PELVIS FINDINGS Hepatobiliary: There are treatment changes within the liver, with multiple regions of necrotic/cystic change, new from the baseline CT of 07/23/2017. Compared to the most recent PET/CT they are has been significant interval decrease in size of all of the lesions previously noted, involving all liver segments. Index lesion in segment 4 B measures 5.8 cm with internal necrosis. Index lesion  within segment 5/6 measuring 5.7 cm, with internal necrosis. Index lesion segment 8 measures 3.2 cm with internal necrosis. Hypervascular region on the margin of a treated lesion within segment 5 (image 122 of series 3) likely perfusion anomaly/Thad. Distention of the gallbladder with gallbladder wall thickening and multiple intrahepatic stones. Calcified stone in the hilum of the liver, likely within the cystic duct. No calcified stone is identified within the distal common bile duct. Pancreas: Unchanged appearance of the pancreas with mild dilation of the pancreatic duct in the body of the pancreas. No inflammatory changes or calcified parenchyma. Spleen: Unremarkable spleen Adrenals/Urinary Tract: Unremarkable appearance of the adrenal glands. No evidence of hydronephrosis of the right or left kidney. No nephrolithiasis. Unremarkable course of the bilateral ureters. Unremarkable appearance of the urinary bladder. Stomach/Bowel: Unremarkable stomach. Small bowel unremarkable without abnormal distention or focal inflammatory changes. Enteric contrast reaches the terminal ileum. Moderate stool burden. No evidence of obstruction. Vascular/Lymphatic: No significant atherosclerotic changes. Edema within the fat at the liver hilum, surrounding the gallbladder within the right abdomen, with mild edema within the mesenteric fat of the mid abdomen. No adenopathy. Reproductive: Unremarkable uterus. Other: None Musculoskeletal: No acute displaced fracture. IMPRESSION: CT demonstrates acute cholecystitis, with choledocholithiasis of the cystic duct. These results were discussed by telephone at the time of interpretation on 12/14/2018 at 5:05 pm with Dr. Burr Medico. Redemonstration of known liver metastases, with positive response to therapy, interval reduction in size of all lesions, with multiple demonstrating significant internal necrosis. No acute finding of the chest. Ancillary findings as above. Electronically Signed: By: Corrie Mckusick D.O. On: 12/14/2018 17:12   Ct Abdomen Pelvis W Contrast  Addendum Date: 12/14/2018   ADDENDUM REPORT: 12/14/2018 19:17 ADDENDUM: Addendum created for clarification of findings related to the gallbladder. Distended gallbladder with inflammatory changes compatible with acute cholecystitis. The obstructive calcified stone is likely in the infundibulum/proximal cystic duct, not within common bile duct. No calcified stones are visualized within the common bile duct. Findings discussed with Dr. Johney Maine of general surgery. Electronically Signed   By: Corrie Mckusick D.O.   On: 12/14/2018 19:17   Result Date: 12/14/2018  CLINICAL DATA:  58 year old female with a history of metastatic pancreatic cancer with right upper quadrant pain and fever EXAM: CT CHEST, ABDOMEN, AND PELVIS WITH CONTRAST TECHNIQUE: Multidetector CT imaging of the chest, abdomen and pelvis was performed following the standard protocol during bolus administration of intravenous contrast. CONTRAST:  123mL OMNIPAQUE IOHEXOL 300 MG/ML  SOLN COMPARISON:  Multiple prior PET CT most recent 09/01/2018, most recent CT abdomen 10/14/2017 FINDINGS: CT CHEST FINDINGS Cardiovascular: Heart size within normal limits. No pericardial fluid/thickening. Unremarkable course caliber contour of the thoracic aorta. Unremarkable diameter of the main pulmonary artery. No proximal filling defects. No significant atherosclerosis. Port catheter on the right chest wall with IJ approach. Mediastinum/Nodes: No adenopathy. Unremarkable appearance of the thoracic esophagus. Unremarkable thoracic inlet. Lungs/Pleura: No pneumothorax or pleural effusion. No confluent airspace disease. Atelectatic changes/scarring bilateral lungs, predominantly in the dependent lung bases, lingula, right middle lobe. Musculoskeletal: No acute displaced fracture. No significant degenerative changes. No bony canal narrowing. CT ABDOMEN PELVIS FINDINGS Hepatobiliary: There are treatment changes  within the liver, with multiple regions of necrotic/cystic change, new from the baseline CT of 07/23/2017. Compared to the most recent PET/CT they are has been significant interval decrease in size of all of the lesions previously noted, involving all liver segments. Index lesion in segment 4 B measures 5.8 cm with internal necrosis. Index lesion within segment 5/6 measuring 5.7 cm, with internal necrosis. Index lesion segment 8 measures 3.2 cm with internal necrosis. Hypervascular region on the margin of a treated lesion within segment 5 (image 122 of series 3) likely perfusion anomaly/Thad. Distention of the gallbladder with gallbladder wall thickening and multiple intrahepatic stones. Calcified stone in the hilum of the liver, likely within the cystic duct. No calcified stone is identified within the distal common bile duct. Pancreas: Unchanged appearance of the pancreas with mild dilation of the pancreatic duct in the body of the pancreas. No inflammatory changes or calcified parenchyma. Spleen: Unremarkable spleen Adrenals/Urinary Tract: Unremarkable appearance of the adrenal glands. No evidence of hydronephrosis of the right or left kidney. No nephrolithiasis. Unremarkable course of the bilateral ureters. Unremarkable appearance of the urinary bladder. Stomach/Bowel: Unremarkable stomach. Small bowel unremarkable without abnormal distention or focal inflammatory changes. Enteric contrast reaches the terminal ileum. Moderate stool burden. No evidence of obstruction. Vascular/Lymphatic: No significant atherosclerotic changes. Edema within the fat at the liver hilum, surrounding the gallbladder within the right abdomen, with mild edema within the mesenteric fat of the mid abdomen. No adenopathy. Reproductive: Unremarkable uterus. Other: None Musculoskeletal: No acute displaced fracture. IMPRESSION: CT demonstrates acute cholecystitis, with choledocholithiasis of the cystic duct. These results were discussed by  telephone at the time of interpretation on 12/14/2018 at 5:05 pm with Dr. Burr Medico. Redemonstration of known liver metastases, with positive response to therapy, interval reduction in size of all lesions, with multiple demonstrating significant internal necrosis. No acute finding of the chest. Ancillary findings as above. Electronically Signed: By: Corrie Mckusick D.O. On: 12/14/2018 17:12   Ir Perc Cholecystostomy  Result Date: 12/15/2018 INDICATION: 58 year old with metastatic pancreatic cancer and acute cholecystitis. Patient is not a surgical candidate. EXAM: PLACEMENT OF CHOLECYSTOSTOMY TUBE WITH ULTRASOUND AND FLUOROSCOPIC GUIDANCE MEDICATIONS: Inpatient receiving scheduled IV antibiotics. ANESTHESIA/SEDATION: Moderate (conscious) sedation was employed during this procedure. A total of Versed 2.0 mg and Fentanyl 100 mcg was administered intravenously. Moderate Sedation Time: 18 minutes minutes. The patient's level of consciousness and vital signs were monitored continuously by radiology nursing throughout the procedure under my direct supervision. FLUOROSCOPY  TIME:  Fluoroscopy Time: 54 seconds, 3 mGy CONTRAST:  5 mL Omnipaque 431 COMPLICATIONS: None immediate. PROCEDURE: Informed written consent was obtained from the patient after a thorough discussion of the procedural risks, benefits and alternatives. All questions were addressed. Maximal Sterile Barrier Technique was utilized including caps, mask, sterile gowns, sterile gloves, sterile drape, hand hygiene and skin antiseptic. A timeout was performed prior to the initiation of the procedure. Right abdomen was prepped and draped in sterile fashion. Ultrasound was used to identify an abnormal gallbladder. Skin was anesthetized with 1% lidocaine. Small skin incision was made. 21 gauge needle was directed into the gallbladder with ultrasound guidance. 0.018 wire was advanced into the gallbladder and a transitional dilator set was placed. Stiff Amplatz wire was  advanced into the gallbladder and the tract was dilated to accommodate a 10.2 [place] multipurpose drain. 80 mL of bloody bilious thick fluid was removed from the gallbladder. Fluid sample sent for culture. Catheter was sutured to skin and attached to a gravity bag. FINDINGS: Gallbladder is markedly abnormal. Gallbladder is distended with wall thickening. Extensive echogenic material in the gallbladder compatible with stones and sludge. Small amount of pericholecystic fluid or ascites. 10.2 French drain was successfully advanced into the gallbladder. 80 mL of bloody bilious fluid was removed from the gallbladder. IMPRESSION: Successful image guided cholecystostomy tube placement. Electronically Signed   By: Markus Daft M.D.   On: 12/15/2018 15:00   Vas [place] Lower Extremity Venous (dvt)  Result Date: 12/15/2018  Lower Venous Study Indications: Edema. Left greater than right  Risk Factors: Cancer Pancreatic. Performing Technologist: Toma Copier RVS  Examination Guidelines: A complete evaluation includes B-mode imaging, spectral Doppler, color Doppler, and power Doppler as needed of all accessible portions of each vessel. Bilateral testing is considered an integral part of a complete examination. Limited examinations for reoccurring indications may be performed as noted.  +-----+---------------+---------+-----------+----------+-------+ RIGHTCompressibilityPhasicitySpontaneityPropertiesSummary +-----+---------------+---------+-----------+----------+-------+ CFV  Full           Yes      Yes                          +-----+---------------+---------+-----------+----------+-------+ SFJ  Full                                                 +-----+---------------+---------+-----------+----------+-------+   +---------+---------------+---------+-----------+----------+------------------+ LEFT     CompressibilityPhasicitySpontaneityPropertiesSummary             +---------+---------------+---------+-----------+----------+------------------+ CFV      Full           Yes      Yes                                     +---------+---------------+---------+-----------+----------+------------------+ SFJ      Full                                                            +---------+---------------+---------+-----------+----------+------------------+ FV Prox  Full           Yes      Yes                                     +---------+---------------+---------+-----------+----------+------------------+  FV Mid   Full                                                            +---------+---------------+---------+-----------+----------+------------------+ FV DistalFull           Yes      Yes                                     +---------+---------------+---------+-----------+----------+------------------+ PFV      Full           Yes      Yes                                     +---------+---------------+---------+-----------+----------+------------------+ POP      Full           Yes      Yes                                     +---------+---------------+---------+-----------+----------+------------------+ PTV      Partial                                      Acute              +---------+---------------+---------+-----------+----------+------------------+ PERO     Full                                         Difficult to image +---------+---------------+---------+-----------+----------+------------------+   Left Technical Findings: Acute DVT noted in one of the paired posterior tibial veins in the distal calf   Summary: Right: There is no evidence of a common femoral vein obstruction. Left: Findings consistent with acute deep vein thrombosis involving the left posterior tibial vein. See technical findings listed above  *See table(s) above for measurements and observations. Electronically signed by Servando Snare MD on 12/15/2018  at 5:10:58 PM.    Final     Assessment: 58 y.o.  Female   1.  Acute cholecystitis, status post cholecystectomy tube placement 4/23 2. Acute LE DVT, on heparin  3.  Metastatic pancreatic cancer on second line chemotherapy with good response 4. Anemia chemotherapy, malignancy, and bleeding, worsening  5.  Moderate thrombocytopenia, secondary to chemotherapy 6.  Transaminitis secondary to chemo antibody test 7.  Moderate protein and calorie malnutrition   Plan:  -I agree with blood transfusion for her anemia, please keep her Hg above 7.0 -Her thrombocytopenia may get worse due to her recent chemotherapy, will be cautious about anticoagulation, may need to hold if platelet drops below 50K -I agree with Eliquis on discharge,due to her prior history of mild CKD  -antibiotics course per primary team, f/u culture  -will hold her chemo for now, I will f/u her in my clinic within one week of discharge  -please call mr or my on-call partner if you have any concerns over the weekend -I appreciate the  excellent care from her hospitalist, IR and surgery     [name], MD 12/16/2018  4:21 PM

## 2018-12-17 LAB — TYPE AND SCREEN
ABO/RH(D): A POS
Antibody Screen: NEGATIVE
Unit division: 0

## 2018-12-17 LAB — BPAM RBC
Blood Product Expiration Date: 202005022359
ISSUE DATE / TIME: 202004241106
Unit Type and Rh: 6200

## 2018-12-17 LAB — BASIC METABOLIC PANEL
Anion gap: 10 (ref 5–15)
BUN: 19 mg/dL (ref 6–20)
CO2: 26 mmol/L (ref 22–32)
Calcium: 8.9 mg/dL (ref 8.9–10.3)
Chloride: 102 mmol/L (ref 98–111)
Creatinine, Ser: 1.15 mg/dL — ABNORMAL HIGH (ref 0.44–1.00)
GFR calc Af Amer: >60 mL/min (ref >60)
GFR calc non Af Amer: 53 mL/min — ABNORMAL LOW (ref >60)
Glucose, Bld: 96 mg/dL (ref 70–99)
Potassium: 3.5 mmol/L (ref 3.5–5.1)
Sodium: 138 mmol/L (ref 135–145)

## 2018-12-17 LAB — CBC
HCT: 22.2 % — ABNORMAL LOW (ref 36.0–46.0)
Hemoglobin: 7.4 g/dL — ABNORMAL LOW (ref 12.0–15.0)
MCH: 33.6 pg (ref 26.0–34.0)
MCHC: 33.3 g/dL (ref 30.0–36.0)
MCV: 100.9 fL — ABNORMAL HIGH (ref 80.0–100.0)
Platelets: 75 10*3/uL — ABNORMAL LOW (ref 150–400)
RBC: 2.2 MIL/uL — ABNORMAL LOW (ref 3.87–5.11)
RDW: 16.8 % — ABNORMAL HIGH (ref 11.5–15.5)
WBC: 2.6 10*3/uL — ABNORMAL LOW (ref 4.0–10.5)
nRBC: 0 % (ref 0.0–0.2)

## 2018-12-17 LAB — HEPATIC FUNCTION PANEL
ALT: 38 U/L (ref 0–44)
AST: 38 U/L (ref 15–41)
Albumin: 2.9 g/dL — ABNORMAL LOW (ref 3.5–5.0)
Alkaline Phosphatase: 296 U/L — ABNORMAL HIGH (ref 38–126)
Bilirubin, Direct: 0.4 mg/dL — ABNORMAL HIGH (ref 0.0–0.2)
Indirect Bilirubin: 0.7 mg/dL (ref 0.3–0.9)
Total Bilirubin: 1.1 mg/dL (ref 0.3–1.2)
Total Protein: 6.4 g/dL — ABNORMAL LOW (ref 6.5–8.1)

## 2018-12-17 LAB — MAGNESIUM: Magnesium: 1.6 mg/dL — ABNORMAL LOW (ref 1.7–2.4)

## 2018-12-17 MED ORDER — MAGNESIUM SULFATE 2 GM/50ML IV SOLN
2.0000 g | Freq: Once | INTRAVENOUS | Status: AC
  Administered 2018-12-17: 09:00:00 2 g via INTRAVENOUS
  Filled 2018-12-17: qty 50

## 2018-12-17 MED ORDER — AMOXICILLIN-POT CLAVULANATE 875-125 MG PO TABS
1.0000 | ORAL_TABLET | Freq: Two times a day (BID) | ORAL | Status: DC
  Administered 2018-12-17 – 2018-12-19 (×5): 1 via ORAL
  Filled 2018-12-17 (×5): qty 1

## 2018-12-17 NOTE — Progress Notes (Addendum)
Assessment & Plan: Acute calculous cholecystitis  Perc cholecystostomy tube placed 2 days ago  Monitor output - sang this AM  On abx  Cultures NTD  Tolerating diet  Generally rec total of 2 weeks of abx for cholecystitis (doesn't  have to be IV)  Discussed GB mgmt with cholecystostomy tube and drew  diagrams Stage IV pancreatic ca  Continue chemo Rx Acute LLE DVT - primary team holding anticoag due to ongoing bleeding for now; VS stable Anemia - hgb 6.5-->1u-->8.1-->7.4 this am; repeat in AM to make sure stable      [name]. [name], MD, FACS General, Bariatric, & Minimally Invasive Surgery [facility], [place]    Chief Complaint: Acute cholecystitis  Subjective: Patient in bed, bright, alert, mild pain. Stopped hep gtt due to ongoing bloody drainage in drain  Objective: Vital signs in last 24 hours: Temp:  [98.2 F (36.8 C)-99.1 F (37.3 C)] 98.2 F (36.8 C) (04/25 0538) Pulse Rate:  [73-89] 73 (04/25 0538) Resp:  [15-20] 15 (04/25 0538) BP: (92-111)/(60-79) 96/60 (04/25 0538) SpO2:  [98 %-99 %] 98 % (04/25 0538) Last BM Date: 12/16/18  Intake/Output from previous day: 04/24 0701 - 04/25 0700 In: 904.7 [I.V.:544; Blood:315; IV Piggyback:45.8] Out: -  Intake/Output this shift: No intake/output data recorded.  Physical Exam: HEENT - sclerae clear, mucous membranes moist Abdomen - soft, mild distension; mild diffuse tenderness, max RUQ; perc drain with serosanguinous output Ext - no edema, non-tender Neuro - alert & oriented, no focal deficits  Lab Results:  Recent Labs    12/16/18 0826 12/16/18 2023 12/17/18 0350  WBC 4.0  --  2.6*  HGB 6.5* 8.1* 7.4*  HCT 20.1* 23.8* 22.2*  PLT 91*  --  75*   BMET Recent Labs    12/16/18 0826 12/17/18 0350  NA 137 138  K 3.4* 3.5  CL 103 102  CO2 26 26  GLUCOSE 100* 96  BUN 17 19  CREATININE 0.99 1.15*  CALCIUM 8.5* 8.9   PT/INR Recent Labs    12/15/18 0941  LABPROT 13.4  INR 1.0    Comprehensive Metabolic Panel:    Component Value Date/Time   NA 138 12/17/2018 0350   NA 137 12/16/2018 0826   NA 134 (L) 08/23/2017 0951   NA 132 (L) 08/20/2017 1257   K 3.5 12/17/2018 0350   K 3.4 (L) 12/16/2018 0826   K 2.5 Repeated and Verified (LL) 08/23/2017 0951   K 3.6 08/20/2017 1257   CL 102 12/17/2018 0350   CL 103 12/16/2018 0826   CO2 26 12/17/2018 0350   CO2 26 12/16/2018 0826   CO2 26 08/23/2017 0951   CO2 29 08/20/2017 1257   BUN 19 12/17/2018 0350   BUN 17 12/16/2018 0826   BUN 11.6 08/23/2017 0951   BUN 17.5 08/20/2017 1257   CREATININE 1.15 (H) 12/17/2018 0350   CREATININE 0.99 12/16/2018 0826   CREATININE 1.04 (H) 12/12/2018 1056   CREATININE 1.61 (H) 01/12/2018 1130   CREATININE 1.0 08/23/2017 0951   CREATININE 1.4 (H) 08/20/2017 1257   GLUCOSE 96 12/17/2018 0350   GLUCOSE 100 (H) 12/16/2018 0826   GLUCOSE 116 08/23/2017 0951   GLUCOSE 114 08/20/2017 1257   CALCIUM 8.9 12/17/2018 0350   CALCIUM 8.5 (L) 12/16/2018 0826   CALCIUM 10.4 08/23/2017 0951   CALCIUM 14.2 (HH) 08/20/2017 1257   AST 38 12/17/2018 0350   AST 37 12/16/2018 0826   AST 48 (H) 12/12/2018 1056  AST 49 (H) 01/12/2018 1130   AST 208 (HH) 08/23/2017 0951   AST 221 (HH) 08/20/2017 1257   ALT 38 12/17/2018 0350   ALT 42 12/16/2018 0826   ALT 53 (H) 12/12/2018 1056   ALT 44 01/12/2018 1130   ALT 151 (H) 08/23/2017 0951   ALT 170 (H) 08/20/2017 1257   ALKPHOS 296 (H) 12/17/2018 0350   ALKPHOS 255 (H) 12/16/2018 0826   ALKPHOS 549 (H) 08/23/2017 0951   ALKPHOS 531 (H) 08/20/2017 1257   BILITOT 1.1 12/17/2018 0350   BILITOT 1.1 12/16/2018 0826   BILITOT 0.5 12/12/2018 1056   BILITOT 1.1 01/12/2018 1130   BILITOT 0.82 08/23/2017 0951   BILITOT 1.92 (H) 08/20/2017 1257   PROT 6.4 (L) 12/17/2018 0350   PROT 6.2 (L) 12/16/2018 0826   PROT 6.4 08/23/2017 0951   PROT 7.2 08/20/2017 1257   ALBUMIN 2.9 (L) 12/17/2018 0350   ALBUMIN 2.9 (L) 12/16/2018 0826   ALBUMIN 2.9 (L)  08/23/2017 0951   ALBUMIN 3.4 (L) 08/20/2017 1257    Studies/Results: Ir Perc Cholecystostomy  Result Date: 12/15/2018 INDICATION: 58 year old with metastatic pancreatic cancer and acute cholecystitis. Patient is not a surgical candidate. EXAM: PLACEMENT OF CHOLECYSTOSTOMY TUBE WITH ULTRASOUND AND FLUOROSCOPIC GUIDANCE MEDICATIONS: Inpatient receiving scheduled IV antibiotics. ANESTHESIA/SEDATION: Moderate (conscious) sedation was employed during this procedure. A total of Versed 2.0 mg and Fentanyl 100 mcg was administered intravenously. Moderate Sedation Time: 18 minutes minutes. The patient's level of consciousness and vital signs were monitored continuously by radiology nursing throughout the procedure under my direct supervision. FLUOROSCOPY TIME:  Fluoroscopy Time: 54 seconds, 3 mGy CONTRAST:  5 mL Omnipaque [id number] COMPLICATIONS: None immediate. PROCEDURE: Informed written consent was obtained from the patient after a thorough discussion of the procedural risks, benefits and alternatives. All questions were addressed. Maximal Sterile Barrier Technique was utilized including caps, mask, sterile gowns, sterile gloves, sterile drape, hand hygiene and skin antiseptic. A timeout was performed prior to the initiation of the procedure. Right abdomen was prepped and draped in sterile fashion. Ultrasound was used to identify an abnormal gallbladder. Skin was anesthetized with 1% lidocaine. Small skin incision was made. 21 gauge needle was directed into the gallbladder with ultrasound guidance. 0.018 wire was advanced into the gallbladder and a transitional dilator set was placed. Stiff Amplatz wire was advanced into the gallbladder and the tract was dilated to accommodate a 10.2 [place] multipurpose drain. 80 mL of bloody bilious thick fluid was removed from the gallbladder. Fluid sample sent for culture. Catheter was sutured to skin and attached to a gravity bag. FINDINGS: Gallbladder is markedly abnormal.  Gallbladder is distended with wall thickening. Extensive echogenic material in the gallbladder compatible with stones and sludge. Small amount of pericholecystic fluid or ascites. 10.2 French drain was successfully advanced into the gallbladder. 80 mL of bloody bilious fluid was removed from the gallbladder. IMPRESSION: Successful image guided cholecystostomy tube placement. Electronically Signed   By: Markus Daft M.D.   On: 12/15/2018 15:00      Greer Pickerel 12/17/2018  Patient ID: Morgan Dawson, female   DOB: [birth year], 58 y.o.   MRN: [id number]

## 2018-12-17 NOTE — Progress Notes (Signed)
PROGRESS NOTE    ANYELY Dawson  CWC:[id number] DOB: 03/07/1961 DOA: 12/14/2018 PCP: [name], MD     Brief Narrative:  Morgan Dawson is a 58 year old female with medical history significant for pancreatic cancer with metastases to the liver, currently on chemotherapy.  She presents with 2-day history of abdominal pain, low-grade fevers.  She admits to abdominal pain radiating to her back, worse in the right upper quadrant.  Denies any nausea or diarrhea.  She presented to the oncology office, underwent CT of the abdomen and pelvis which revealed cholecystitis.  She was referred to the emergency department at that time.  Patient admitted for acute cholecystitis, started on IV Zosyn with general surgery consulted.  Patient underwent percutaneous cholecystostomy tube placement by IR 4/23.  New events last 24 hours / Subjective: Had bleeding in her percutaneous cholecystostomy tube yesterday evening.  We stopped IV heparin yesterday evening.  Bleeding has slowed down overnight.  We discussed risks and benefits of continuing anticoagulation therapy.  Her DVT was in the distal left calf.  She is pretty asymptomatic from the DVT standpoint.  We discussed options such as resuming IV heparin, monitoring bloody output and needing blood transfusions versus foregoing anticoagulation and repeating a Doppler ultrasound in several weeks to reevaluate her DVT.  Patient elected to stop anticoagulation at this point, and follow-up as an outpatient for repeat ultrasound.  She does also have pancytopenia secondary to chemotherapy which puts her at increased risk for bleeding at this time.  Assessment & Plan:   Principal Problem:   Acute calculous cholecystitis Active Problems:   Pancreatic cancer ([place])   Pancreatic cancer metastasized to liver ([place])   Port-A-Cath in place   CKD (chronic kidney disease), stage III ([place])   Anemia due to antineoplastic chemotherapy   Hypokalemia   Thrombocytopenia ([facility])  Vertigo   Meniere's disease of right ear   Liver metastases - presumed pancreatic cancer primary   Cholelithiasis - calcified gallstones   On antineoplastic chemotherapy   Inguinal hernia, left   Hemangioma of liver, right lobe segment 5   Leg edema, left   Acute calculous cholecystitis General surgery following S/p percutaneous cholecystostomy tube placement by IR 4/23.  Removed 80 mL thick bloody bilious fluid, fluid culture no growth at 2 days IV Zosyn transition to Augmentin.  We will continue for total 2-week therapy  Pancreatic cancer with mets to the liver Followed by Dr. Burr Medico, oncology  Left lower leg DVT She was started on IV heparin, this has been stopped due to bleeding in her cholecystostomy tube.  Patient has elected to forego systemic anticoagulation at this point, recommended to follow-up as an outpatient in several weeks for repeat DVT ultrasound.  We discussed early mobilization, SCD to decrease future risk of DVT  Acute blood loss anemia on chronic macrocytic anemia Baseline hemoglobin around 9, noted also to be pancytopenic while on chemotherapy Received 1 unit packed red blood cells 4/24 Monitor CBC  CKD stage III Creatinine remains stable  Elevated liver enzymes Resolved  Elevated TSH Repeat labs as an outpatient in several weeks  Hypomagnesemia Replace, trend   DVT prophylaxis: SCD Code Status: Full Family Communication: None.  Patient has been updating her family and friends Disposition Plan: Monitor CBC and blood output from her cholecystostomy tube.  Hopefully discharge in the next 1 to 2 days   Consultants:   General surgery  Oncology  IR  Procedures:   S/p percutaneous cholecystostomy tube placement by IR 4/23.  Removed 80 mL thick bloody bilious fluid  Antimicrobials:  Anti-infectives (From admission, onward)   Start     Dose/Rate Route Frequency Ordered Stop   12/17/18 1000  amoxicillin-clavulanate (AUGMENTIN) 875-125 MG per  tablet 1 tablet     1 tablet Oral Every 12 hours 12/17/18 0825     12/15/18 0200  piperacillin-tazobactam (ZOSYN) IVPB 3.375 g  Status:  Discontinued     3.375 g 12.5 mL/hr over 240 Minutes Intravenous Every 8 hours 12/14/18 1910 12/17/18 0825   12/14/18 1900  piperacillin-tazobactam (ZOSYN) IVPB 3.375 g     3.375 g 100 mL/hr over 30 Minutes Intravenous  Once 12/14/18 1845 12/14/18 2028       Objective: Vitals:   12/16/18 1125 12/16/18 1335 12/16/18 2045 12/17/18 0538  BP: 103/72 97/64 111/79 96/60  Pulse: 83 82 89 73  Resp: 17 20 17 15   Temp: 98.7 F (37.1 C) 99.1 F (37.3 C) 98.4 F (36.9 C) 98.2 F (36.8 C)  TempSrc: Oral Oral  Oral  SpO2: 99% 98% 98% 98%  Weight:      Height:        Intake/Output Summary (Last 24 hours) at 12/17/2018 0948 Last data filed at 12/16/2018 1500 Gross per 24 hour  Intake 904.72 ml  Output --  Net 904.72 ml   Filed Weights   12/14/18 2044  Weight: 47.2 kg    Examination: General exam: Appears calm and comfortable, thin Respiratory system: Clear to auscultation. Respiratory effort normal. Cardiovascular system: S1 & S2 heard, RRR. No JVD, murmurs, rubs, gallops or clicks. No pedal edema. Gastrointestinal system: Abdomen is nondistended, soft and tender to palpation right upper quadrant.  Cholecystostomy tube draining sanguinous fluid approximately 150 mL Central nervous system: Alert and oriented. No focal neurological deficits. Extremities: Symmetric 5 x 5 power. Skin: No rashes, lesions or ulcers Psychiatry: Judgement and insight appear normal. Mood & affect appropriate.    Data Reviewed: I have personally reviewed following labs and imaging studies  CBC: Recent Labs  Lab 12/12/18 1056 12/14/18 1317 12/15/18 0841 12/16/18 0826 12/16/18 2023 12/17/18 0350  WBC 2.0* 7.4 5.9   5.9 4.0  --  2.6*  NEUTROABS 1.1* 7.1  --   --   --   --   HGB 8.8* 8.9* 7.1*   7.2* 6.5* 8.1* 7.4*  HCT 26.8* 26.3* 21.4*   21.3* 20.1* 23.8* 22.2*   MCV 106.8* 102.7* 104.4*   104.4* 105.8*  --  100.9*  PLT 120* 132* 93*   90* 91*  --  75*   Basic Metabolic Panel: Recent Labs  Lab 12/12/18 1056 12/14/18 1317 12/15/18 0841 12/16/18 0826 12/17/18 0350  NA 138 135 135   135 137 138  K 3.8 3.5 3.2*   3.3* 3.4* 3.5  CL 103 99 100   100 103 102  CO2 27 27 28   28 26 26   GLUCOSE 110* 147* 98   97 100* 96  BUN 21* 23* 22*   22* 17 19  CREATININE 1.04* 1.14* 0.96   0.99 0.99 1.15*  CALCIUM 9.3 9.0 8.6*   8.6* 8.5* 8.9  MG  --   --  1.7  --  1.6*  PHOS  --   --  3.6  --   --    GFR: Estimated Creatinine Clearance: 40.7 mL/min (A) (by C-G formula based on SCr of 1.15 mg/dL (H)). Liver Function Tests: Recent Labs  Lab 12/12/18 1056 12/14/18 1317 12/15/18 1610 12/16/18 0826 12/17/18 0350  AST 48* 55* 38   38 37 38  ALT 53* 57* 45*   45* 42 38  ALKPHOS 317* 354* 221*   227* 255* 296*  BILITOT 0.5 1.5* 1.9*   1.8* 1.1 1.1  PROT 7.7 7.7 6.5   6.6 6.2* 6.4*  ALBUMIN 3.4* 3.4* 3.0*   3.0* 2.9* 2.9*   Recent Labs  Lab 12/14/18 1332  LIPASE 65*  AMYLASE 53   No results for input(s): AMMONIA in the last 168 hours. Coagulation Profile: Recent Labs  Lab 12/15/18 0941  INR 1.0   Cardiac Enzymes: No results for input(s): CKTOTAL, CKMB, CKMBINDEX, TROPONINI in the last 168 hours. BNP (last 3 results) No results for input(s): PROBNP in the last 8760 hours. HbA1C: No results for input(s): HGBA1C in the last 72 hours. CBG: Recent Labs  Lab 12/15/18 2058  GLUCAP 94   Lipid Profile: No results for input(s): CHOL, HDL, LDLCALC, TRIG, CHOLHDL, LDLDIRECT in the last 72 hours. Thyroid Function Tests: Recent Labs    12/15/18 0841  TSH 13.948*   Anemia Panel: No results for input(s): VITAMINB12, FOLATE, FERRITIN, TIBC, IRON, RETICCTPCT in the last 72 hours. Sepsis Labs: No results for input(s): PROCALCITON, LATICACIDVEN in the last 168 hours.  Recent Results (from the past 240 hour(s))  Culture, Blood     Status: None  (Preliminary result)   Collection Time: 12/14/18  1:27 PM  Result Value Ref Range Status   Specimen Description   Final    BLOOD LEFT ARM Performed at [facility], [address] [place], [place] [address]    Special Requests   Final    BOTTLES DRAWN AEROBIC AND ANAEROBIC Blood Culture results may not be optimal due to an excessive volume of blood received in culture bottles   Culture   Final    NO GROWTH 2 DAYS Performed at [facility], [place] [address], [place], [place] [address]    Report Status PENDING  Incomplete  Culture, Blood     Status: None (Preliminary result)   Collection Time: 12/14/18  1:50 PM  Result Value Ref Range Status   Specimen Description BLOOD PORTA CATH  Final   Special Requests   Final    BOTTLES DRAWN AEROBIC AND ANAEROBIC Blood Culture adequate volume   Culture   Final    NO GROWTH 2 DAYS Performed at [facility], [place] [address] [place], [place] [address]    Report Status PENDING  Incomplete  Urine Culture     Status: Abnormal   Collection Time: 12/14/18  2:38 PM  Result Value Ref Range Status   Specimen Description   Final    URINE, CLEAN CATCH Performed at [facility], [place] [address] [place], [place] [address]    Special Requests   Final    NONE Performed at [facility], [place] [address] [place], [place] [address]    Culture (A)  Final    <10,000 COLONIES/mL INSIGNIFICANT GROWTH Performed at [facility] [address], [place], [place] [address]    Report Status 12/15/2018 FINAL  Final  Aerobic/Anaerobic Culture (surgical/deep wound)     Status: None (Preliminary result)   Collection Time: 12/15/18  2:57 PM  Result Value Ref Range Status   Specimen Description   Final    BILE Performed at [place] [address], [place],  [address]    Special Requests   Final  NONE Performed at [facility], [place] [address], [place], [place] [address]    Gram Stain   Final    ABUNDANT WBC PRESENT, PREDOMINANTLY PMN NO ORGANISMS SEEN    Culture   Final    NO GROWTH 2 DAYS NO ANAEROBES ISOLATED; CULTURE IN PROGRESS FOR 5 DAYS Performed at [facility] [address], [place], [place] [address]    Report Status PENDING  Incomplete       Radiology Studies: Ir Perc Cholecystostomy  Result Date: 12/15/2018 INDICATION: 58 year old with metastatic pancreatic cancer and acute cholecystitis. Patient is not a surgical candidate. EXAM: PLACEMENT OF CHOLECYSTOSTOMY TUBE WITH ULTRASOUND AND FLUOROSCOPIC GUIDANCE MEDICATIONS: Inpatient receiving scheduled IV antibiotics. ANESTHESIA/SEDATION: Moderate (conscious) sedation was employed during this procedure. A total of Versed 2.0 mg and Fentanyl 100 mcg was administered intravenously. Moderate Sedation Time: 18 minutes minutes. The patient's level of consciousness and vital signs were monitored continuously by radiology nursing throughout the procedure under my direct supervision. FLUOROSCOPY TIME:  Fluoroscopy Time: 54 seconds, 3 mGy CONTRAST:  5 mL Omnipaque 387 COMPLICATIONS: None immediate. PROCEDURE: Informed written consent was obtained from the patient after a thorough discussion of the procedural risks, benefits and alternatives. All questions were addressed. Maximal Sterile Barrier Technique was utilized including caps, mask, sterile gowns, sterile gloves, sterile drape, hand hygiene and skin antiseptic. A timeout was performed prior to the initiation of the procedure. Right abdomen was prepped and draped in sterile fashion. Ultrasound was used to identify an abnormal gallbladder. Skin was anesthetized with 1% lidocaine. Small skin incision was made. 21 gauge needle was directed into the gallbladder with ultrasound guidance. 0.018 wire was advanced into the gallbladder and a transitional dilator set was placed. Stiff Amplatz wire was advanced  into the gallbladder and the tract was dilated to accommodate a 10.2 [place] multipurpose drain. 80 mL of bloody bilious thick fluid was removed from the gallbladder. Fluid sample sent for culture. Catheter was sutured to skin and attached to a gravity bag. FINDINGS: Gallbladder is markedly abnormal. Gallbladder is distended with wall thickening. Extensive echogenic material in the gallbladder compatible with stones and sludge. Small amount of pericholecystic fluid or ascites. 10.2 French drain was successfully advanced into the gallbladder. 80 mL of bloody bilious fluid was removed from the gallbladder. IMPRESSION: Successful image guided cholecystostomy tube placement. Electronically Signed   By: Markus Daft M.D.   On: 12/15/2018 15:00      Scheduled Meds:  amoxicillin-clavulanate  1 tablet Oral Q12H   gabapentin  100 mg Oral BID   lip balm  1 application Topical BID   sodium chloride flush  10-40 mL Intracatheter Q12H   Continuous Infusions:  sodium chloride 250 mL (12/15/18 2206)   magnesium sulfate 1 - 4 g bolus IVPB 2 g (12/17/18 0903)   methocarbamol (ROBAXIN) IV     ondansetron (ZOFRAN) IV       LOS: 3 days    Time spent: 25 minutes   Dessa Phi, DO [facility] [email or web address] 12/17/2018, 9:48 AM

## 2018-12-18 LAB — BASIC METABOLIC PANEL
Anion gap: 8 (ref 5–15)
BUN: 17 mg/dL (ref 6–20)
CO2: 27 mmol/L (ref 22–32)
Calcium: 8.9 mg/dL (ref 8.9–10.3)
Chloride: 102 mmol/L (ref 98–111)
Creatinine, Ser: 1.06 mg/dL — ABNORMAL HIGH (ref 0.44–1.00)
GFR calc Af Amer: >60 mL/min (ref >60)
GFR calc non Af Amer: 59 mL/min — ABNORMAL LOW (ref >60)
Glucose, Bld: 98 mg/dL (ref 70–99)
Potassium: 3.4 mmol/L — ABNORMAL LOW (ref 3.5–5.1)
Sodium: 137 mmol/L (ref 135–145)

## 2018-12-18 LAB — CBC
HCT: 22.6 % — ABNORMAL LOW (ref 36.0–46.0)
Hemoglobin: 7.6 g/dL — ABNORMAL LOW (ref 12.0–15.0)
MCH: 34.4 pg — ABNORMAL HIGH (ref 26.0–34.0)
MCHC: 33.6 g/dL (ref 30.0–36.0)
MCV: 102.3 fL — ABNORMAL HIGH (ref 80.0–100.0)
Platelets: 67 10*3/uL — ABNORMAL LOW (ref 150–400)
RBC: 2.21 MIL/uL — ABNORMAL LOW (ref 3.87–5.11)
RDW: 16 % — ABNORMAL HIGH (ref 11.5–15.5)
WBC: 2.1 10*3/uL — ABNORMAL LOW (ref 4.0–10.5)
nRBC: 0 % (ref 0.0–0.2)

## 2018-12-18 MED ORDER — SODIUM CHLORIDE 0.9% FLUSH: 5.0000 mL | Freq: Every day | INTRAVENOUS | 3 refills | Status: DC

## 2018-12-18 MED ORDER — POTASSIUM CHLORIDE CRYS ER 10 MEQ PO TBCR
40.0000 meq | EXTENDED_RELEASE_TABLET | Freq: Once | ORAL | Status: AC
  Administered 2018-12-18: 40 meq via ORAL
  Filled 2018-12-18: qty 4

## 2018-12-18 MED ORDER — OXYCODONE HCL 5 MG PO TABS: 5.0000 mg | ORAL_TABLET | ORAL | Status: DC | PRN

## 2018-12-18 MED ORDER — POTASSIUM CHLORIDE CRYS ER 20 MEQ PO TBCR
40.0000 meq | EXTENDED_RELEASE_TABLET | Freq: Once | ORAL | Status: DC
  Filled 2018-12-18: qty 2

## 2018-12-18 NOTE — Progress Notes (Signed)
PROGRESS NOTE    Morgan Dawson  TDD:[id number] DOB: 08-Aug-1961 DOA: 12/14/2018 PCP: [name], MD     Brief Narrative:  Morgan Dawson is a 58 year old female with medical history significant for pancreatic cancer with metastases to the liver, currently on chemotherapy.  She presents with 2-day history of abdominal pain, low-grade fevers.  She admits to abdominal pain radiating to her back, worse in the right upper quadrant.  Denies any nausea or diarrhea.  She presented to the oncology office, underwent CT of the abdomen and pelvis which revealed cholecystitis.  She was referred to the emergency department at that time.  Patient admitted for acute cholecystitis, started on IV Zosyn with general surgery consulted.  Patient underwent percutaneous cholecystostomy tube placement by IR 4/23. Also dx with LLE DVT and started on IV heparin.   She had bleeding in her percutaneous cholecystostomy tube since the procedure.  We stopped IV heparin.  We discussed risks and benefits of continuing anticoagulation therapy.  Her DVT was in the distal left calf.  She is pretty asymptomatic from the DVT standpoint.  We discussed options such as resuming IV heparin, monitoring bloody output and needing blood transfusions versus foregoing anticoagulation and repeating a Doppler ultrasound in several weeks to reevaluate her DVT.  Patient elected to stop anticoagulation at this point, and follow-up as an outpatient for repeat ultrasound.  She does also have pancytopenia secondary to chemotherapy which puts her at increased risk for bleeding at this time.  New events last 24 hours / Subjective: Continues to have sanguinous output in chole tube. 271ml output documented yesterday and 237ml already in bag this morning. She had couple of episodes of epistaxis overnight as well. Tolerating diet, otherwise feeling well, has worsening pain with cough but overall pain well controlled.   Assessment & Plan:   Principal  Problem:   Acute calculous cholecystitis Active Problems:   Pancreatic cancer ([place])   Pancreatic cancer metastasized to liver ([place])   Port-A-Cath in place   CKD (chronic kidney disease), stage III ([place])   Anemia due to antineoplastic chemotherapy   Hypokalemia   Thrombocytopenia (HCC)   Vertigo   Meniere's disease of right ear   Liver metastases - presumed pancreatic cancer primary   Cholelithiasis - calcified gallstones   On antineoplastic chemotherapy   Inguinal hernia, left   Hemangioma of liver, right lobe segment 5   Leg edema, left   Acute calculous cholecystitis -General surgery following -S/p percutaneous cholecystostomy tube placement by IR 4/23.  Removed 80 mL thick bloody bilious fluid, fluid culture no growth to date  -IV Zosyn transition to Augmentin.  We will continue for total 2-week therapy  Pancreatic cancer with mets to the liver -Followed by Dr. Burr Medico, oncology  Left lower leg DVT -She was started on IV heparin, this has been stopped due to bleeding in her cholecystostomy tube.  Patient has elected to forego systemic anticoagulation at this point, recommended to follow-up as an outpatient in several weeks for repeat DVT ultrasound.  We discussed early mobilization, SCD to decrease future risk of DVT  Acute blood loss anemia on chronic macrocytic anemia -Baseline hemoglobin around 9, noted also to be pancytopenic while on chemotherapy -Received 1 unit packed red blood cells 4/24 -Monitor CBC, Hgb stable today 7.6   CKD stage III -Creatinine remains stable  Elevated liver enzymes -Resolved  Elevated TSH -Repeat labs as an outpatient in several weeks  Hypokalemia -Replace, trend   DVT prophylaxis: SCD Code Status:  Full Family Communication: None.  Patient has been updating her family and friends Disposition Plan: Monitor CBC and blood output from her cholecystostomy tube.  Hopefully discharge in the next 1 to 2 days   Consultants:   General  surgery  Oncology  IR  Procedures:   S/p percutaneous cholecystostomy tube placement by IR 4/23.  Removed 80 mL thick bloody bilious fluid  Antimicrobials:  Anti-infectives (From admission, onward)   Start     Dose/Rate Route Frequency Ordered Stop   12/17/18 1000  amoxicillin-clavulanate (AUGMENTIN) 875-125 MG per tablet 1 tablet     1 tablet Oral Every 12 hours 12/17/18 0825     12/15/18 0200  piperacillin-tazobactam (ZOSYN) IVPB 3.375 g  Status:  Discontinued     3.375 g 12.5 mL/hr over 240 Minutes Intravenous Every 8 hours 12/14/18 1910 12/17/18 0825   12/14/18 1900  piperacillin-tazobactam (ZOSYN) IVPB 3.375 g     3.375 g 100 mL/hr over 30 Minutes Intravenous  Once 12/14/18 1845 12/14/18 2028       Objective: Vitals:   12/17/18 1351 12/17/18 1352 12/17/18 2057 12/18/18 0535  BP: 97/67  115/73 94/66  Pulse: 76  84 73  Resp: 18 18 19 19   Temp: 98.3 F (36.8 C)  98.7 F (37.1 C) 98.6 F (37 C)  TempSrc: Oral  Oral Oral  SpO2: 97%  98% 96%  Weight:      Height:        Intake/Output Summary (Last 24 hours) at 12/18/2018 0818 Last data filed at 12/17/2018 1300 Gross per 24 hour  Intake 340 ml  Output 225 ml  Net 115 ml   Filed Weights   12/14/18 2044  Weight: 47.2 kg    Examination: General exam: Appears calm and comfortable  Respiratory system: Clear to auscultation. Respiratory effort normal. Cardiovascular system: S1 & S2 heard, RRR. No JVD, murmurs, rubs, gallops or clicks. No pedal edema. Gastrointestinal system: Abdomen is nondistended, soft and mildly TTP RUQ, chole drain with sanguinous fluid  Central nervous system: Alert and oriented. No focal neurological deficits. Extremities: Symmetric 5 x 5 power. Skin: No rashes, lesions or ulcers Psychiatry: Judgement and insight appear normal. Mood & affect appropriate.    Data Reviewed: I have personally reviewed following labs and imaging studies  CBC: Recent Labs  Lab 12/12/18 1056 12/14/18 1317  12/15/18 0841 12/16/18 0826 12/16/18 2023 12/17/18 0350 12/18/18 0410  WBC 2.0* 7.4 5.9  5.9 4.0  --  2.6* 2.1*  NEUTROABS 1.1* 7.1  --   --   --   --   --   HGB 8.8* 8.9* 7.1*  7.2* 6.5* 8.1* 7.4* 7.6*  HCT 26.8* 26.3* 21.4*  21.3* 20.1* 23.8* 22.2* 22.6*  MCV 106.8* 102.7* 104.4*  104.4* 105.8*  --  100.9* 102.3*  PLT 120* 132* 93*  90* 91*  --  75* 67*   Basic Metabolic Panel: Recent Labs  Lab 12/14/18 1317 12/15/18 0841 12/16/18 0826 12/17/18 0350 12/18/18 0410  NA 135 135  135 137 138 137  K 3.5 3.2*  3.3* 3.4* 3.5 3.4*  CL 99 100  100 103 102 102  CO2 27 28  28 26 26 27   GLUCOSE 147* 98  97 100* 96 98  BUN 23* 22*  22* 17 19 17   CREATININE 1.14* 0.96  0.99 0.99 1.15* 1.06*  CALCIUM 9.0 8.6*  8.6* 8.5* 8.9 8.9  MG  --  1.7  --  1.6*  --   PHOS  --  3.6  --   --   --    GFR: Estimated Creatinine Clearance: 44.2 mL/min (A) (by C-G formula based on SCr of 1.06 mg/dL (H)). Liver Function Tests: Recent Labs  Lab 12/12/18 1056 12/14/18 1317 12/15/18 0841 12/16/18 0826 12/17/18 0350  AST 48* 55* 38  38 37 38  ALT 53* 57* 45*  45* 42 38  ALKPHOS 317* 354* 221*  227* 255* 296*  BILITOT 0.5 1.5* 1.9*  1.8* 1.1 1.1  PROT 7.7 7.7 6.5  6.6 6.2* 6.4*  ALBUMIN 3.4* 3.4* 3.0*  3.0* 2.9* 2.9*   Recent Labs  Lab 12/14/18 1332  LIPASE 65*  AMYLASE 53   No results for input(s): AMMONIA in the last 168 hours. Coagulation Profile: Recent Labs  Lab 12/15/18 0941  INR 1.0   Cardiac Enzymes: No results for input(s): CKTOTAL, CKMB, CKMBINDEX, TROPONINI in the last 168 hours. BNP (last 3 results) No results for input(s): PROBNP in the last 8760 hours. HbA1C: No results for input(s): HGBA1C in the last 72 hours. CBG: Recent Labs  Lab 12/15/18 2058  GLUCAP 94   Lipid Profile: No results for input(s): CHOL, HDL, LDLCALC, TRIG, CHOLHDL, LDLDIRECT in the last 72 hours. Thyroid Function Tests: Recent Labs    12/15/18 0841  TSH 13.948*   Anemia  Panel: No results for input(s): VITAMINB12, FOLATE, FERRITIN, TIBC, IRON, RETICCTPCT in the last 72 hours. Sepsis Labs: No results for input(s): PROCALCITON, LATICACIDVEN in the last 168 hours.  Recent Results (from the past 240 hour(s))  Culture, Blood     Status: None (Preliminary result)   Collection Time: 12/14/18  1:27 PM  Result Value Ref Range Status   Specimen Description   Final    BLOOD LEFT ARM Performed at [facility], [address] [place], [place] [address]    Special Requests   Final    BOTTLES DRAWN AEROBIC AND ANAEROBIC Blood Culture results may not be optimal due to an excessive volume of blood received in culture bottles   Culture   Final    NO GROWTH 4 DAYS Performed at [facility], [place] [address] [place], [place] [address]    Report Status PENDING  Incomplete  Culture, Blood     Status: None (Preliminary result)   Collection Time: 12/14/18  1:50 PM  Result Value Ref Range Status   Specimen Description BLOOD PORTA CATH  Final   Special Requests   Final    BOTTLES DRAWN AEROBIC AND ANAEROBIC Blood Culture adequate volume   Culture   Final    NO GROWTH 4 DAYS Performed at [facility], [place] [address] [place], [place] [address]    Report Status PENDING  Incomplete  Urine Culture     Status: Abnormal   Collection Time: 12/14/18  2:38 PM  Result Value Ref Range Status   Specimen Description   Final    URINE, CLEAN CATCH Performed at [facility], [place] [address], [place], [place] [address]    Special Requests   Final    NONE Performed at [facility], [place] [address] [place], [place] [address]    Culture (A)  Final    <10,000 COLONIES/mL INSIGNIFICANT GROWTH Performed at [place] [address] [place], [place] [address]    Report Status 12/15/2018 FINAL  Final  Aerobic/Anaerobic Culture (surgical/deep wound)     Status: None (Preliminary result)    Collection Time: 12/15/18  2:57 PM  Result Value Ref Range Status   Specimen Description   Final    BILE Performed at [place] [address] [place], [place] [address]    Special Requests   Final    NONE Performed at [facility], [place] [address] [place], [place] [address]    Gram Stain   Final    ABUNDANT WBC PRESENT, PREDOMINANTLY PMN NO ORGANISMS SEEN    Culture   Final    NO GROWTH 2 DAYS NO ANAEROBES ISOLATED; CULTURE IN PROGRESS FOR 5 DAYS Performed at [place] [address] [place], [place] [address]    Report Status PENDING  Incomplete       Radiology Studies: No results found.    Scheduled Meds: . amoxicillin-clavulanate  1 tablet Oral Q12H  . gabapentin  100 mg Oral BID  . lip balm  1 application Topical BID  . potassium chloride  40 mEq Oral Once  . sodium chloride flush  10-40 mL Intracatheter Q12H   Continuous Infusions: . sodium chloride 250 mL (12/15/18 2206)  . methocarbamol (ROBAXIN) IV    . ondansetron (ZOFRAN) IV       LOS: 4 days    Time spent: 25 minutes   Dessa Phi, DO [facility] [email or web address] 12/18/2018, 8:18 AM

## 2018-12-18 NOTE — Progress Notes (Signed)
PROGRESS NOTE    Morgan Dawson  OVF:[id number] DOB: 08/28/1960 DOA: 12/14/2018 PCP: [name], MD     Brief Narrative:  Morgan Dawson is a 58 year old female with medical history significant for pancreatic cancer with metastases to the liver, currently on chemotherapy.  She presents with 2-day history of abdominal pain, low-grade fevers.  She admits to abdominal pain radiating to her back, worse in the right upper quadrant.  Denies any nausea or diarrhea.  She presented to the oncology office, underwent CT of the abdomen and pelvis which revealed cholecystitis.  She was referred to the emergency department at that time.  Patient admitted for acute cholecystitis, started on IV Zosyn with general surgery consulted.  Patient underwent percutaneous cholecystostomy tube placement by IR 4/23. Also dx with LLE DVT and started on IV heparin.   She had bleeding in her percutaneous cholecystostomy tube since the procedure.  We stopped IV heparin.  We discussed risks and benefits of continuing anticoagulation therapy.  Her DVT was in the distal left calf.  She is pretty asymptomatic from the DVT standpoint.  We discussed options such as resuming IV heparin, monitoring bloody output and needing blood transfusions versus foregoing anticoagulation and repeating a Doppler ultrasound in several weeks to reevaluate her DVT.  Patient elected to stop anticoagulation at this point, and follow-up as an outpatient for repeat ultrasound.  She does also have pancytopenia secondary to chemotherapy which puts her at increased risk for bleeding at this time.  New events last 24 hours / Subjective: Continues to have sanguinous output in chole tube. 273ml output documented yesterday and 246ml already in bag this morning. She had couple of episodes of epistaxis overnight as well. Tolerating diet, otherwise feeling well, has worsening pain with cough but overall pain well controlled.   Assessment & Plan:   Principal  Problem:   Acute calculous cholecystitis Active Problems:   Pancreatic cancer ([place])   Pancreatic cancer metastasized to liver ([place])   Port-A-Cath in place   CKD (chronic kidney disease), stage III ([place])   Anemia due to antineoplastic chemotherapy   Hypokalemia   Thrombocytopenia (HCC)   Vertigo   Meniere's disease of right ear   Liver metastases - presumed pancreatic cancer primary   Cholelithiasis - calcified gallstones   On antineoplastic chemotherapy   Inguinal hernia, left   Hemangioma of liver, right lobe segment 5   Leg edema, left   Acute calculous cholecystitis -General surgery following -S/p percutaneous cholecystostomy tube placement by IR 4/23.  Removed 80 mL thick bloody bilious fluid, fluid culture no growth to date  -IV Zosyn transition to Augmentin.  We will continue for total 2-week therapy  Pancreatic cancer with mets to the liver -Followed by Dr. Burr Medico, oncology  Left lower leg DVT -She was started on IV heparin, this has been stopped due to bleeding in her cholecystostomy tube.  Patient has elected to forego systemic anticoagulation at this point due to bleeding risk.  We discussed early mobilization, SCD to decrease future risk of DVT -Discussed with Dr. Burr Medico over the phone today regarding low-dose anticoagulation versus IVC filter placement.  For now, hold off on systemic anticoagulation.  Dr. Burr Medico will discuss with patient tomorrow for further plan  Acute blood loss anemia on chronic macrocytic anemia -Baseline hemoglobin around 9, noted also to be pancytopenic while on chemotherapy -Received 1 unit packed red blood cells 4/24 -Monitor CBC, Hgb stable today 7.6   CKD stage III -Creatinine remains stable  Elevated  liver enzymes -Resolved  Elevated TSH -Repeat labs as an outpatient in several weeks  Hypokalemia -Replace, trend   DVT prophylaxis: SCD Code Status: Full Family Communication: None.  Patient has been updating her family and friends  Disposition Plan: Monitor CBC and blood output from her cholecystostomy tube.  Hopefully discharge in the next 1 to 2 days   Consultants:   General surgery  Oncology  IR  Procedures:   S/p percutaneous cholecystostomy tube placement by IR 4/23.  Removed 80 mL thick bloody bilious fluid  Antimicrobials:  Anti-infectives (From admission, onward)   Start     Dose/Rate Route Frequency Ordered Stop   12/17/18 1000  amoxicillin-clavulanate (AUGMENTIN) 875-125 MG per tablet 1 tablet     1 tablet Oral Every 12 hours 12/17/18 0825     12/15/18 0200  piperacillin-tazobactam (ZOSYN) IVPB 3.375 g  Status:  Discontinued     3.375 g 12.5 mL/hr over 240 Minutes Intravenous Every 8 hours 12/14/18 1910 12/17/18 0825   12/14/18 1900  piperacillin-tazobactam (ZOSYN) IVPB 3.375 g     3.375 g 100 mL/hr over 30 Minutes Intravenous  Once 12/14/18 1845 12/14/18 2028       Objective: Vitals:   12/17/18 1351 12/17/18 1352 12/17/18 2057 12/18/18 0535  BP: 97/67  115/73 94/66  Pulse: 76  84 73  Resp: 18 18 19 19   Temp: 98.3 F (36.8 C)  98.7 F (37.1 C) 98.6 F (37 C)  TempSrc: Oral  Oral Oral  SpO2: 97%  98% 96%  Weight:      Height:        Intake/Output Summary (Last 24 hours) at 12/18/2018 0920 Last data filed at 12/17/2018 1300 Gross per 24 hour  Intake 100 ml  Output 225 ml  Net -125 ml   Filed Weights   12/14/18 2044  Weight: 47.2 kg    Examination: General exam: Appears calm and comfortable  Respiratory system: Clear to auscultation. Respiratory effort normal. Cardiovascular system: S1 & S2 heard, RRR. No JVD, murmurs, rubs, gallops or clicks. No pedal edema. Gastrointestinal system: Abdomen is nondistended, soft and mildly TTP RUQ, chole drain with sanguinous fluid  Central nervous system: Alert and oriented. No focal neurological deficits. Extremities: Symmetric 5 x 5 power. Skin: No rashes, lesions or ulcers Psychiatry: Judgement and insight appear normal. Mood & affect  appropriate.    Data Reviewed: I have personally reviewed following labs and imaging studies  CBC: Recent Labs  Lab 12/12/18 1056 12/14/18 1317 12/15/18 0841 12/16/18 0826 12/16/18 2023 12/17/18 0350 12/18/18 0410  WBC 2.0* 7.4 5.9  5.9 4.0  --  2.6* 2.1*  NEUTROABS 1.1* 7.1  --   --   --   --   --   HGB 8.8* 8.9* 7.1*  7.2* 6.5* 8.1* 7.4* 7.6*  HCT 26.8* 26.3* 21.4*  21.3* 20.1* 23.8* 22.2* 22.6*  MCV 106.8* 102.7* 104.4*  104.4* 105.8*  --  100.9* 102.3*  PLT 120* 132* 93*  90* 91*  --  75* 67*   Basic Metabolic Panel: Recent Labs  Lab 12/14/18 1317 12/15/18 0841 12/16/18 0826 12/17/18 0350 12/18/18 0410  NA 135 135  135 137 138 137  K 3.5 3.2*  3.3* 3.4* 3.5 3.4*  CL 99 100  100 103 102 102  CO2 27 28  28 26 26 27   GLUCOSE 147* 98  97 100* 96 98  BUN 23* 22*  22* 17 19 17   CREATININE 1.14* 0.96  0.99 0.99 1.15* 1.06*  CALCIUM 9.0 8.6*  8.6* 8.5* 8.9 8.9  MG  --  1.7  --  1.6*  --   PHOS  --  3.6  --   --   --    GFR: Estimated Creatinine Clearance: 44.2 mL/min (A) (by C-G formula based on SCr of 1.06 mg/dL (H)). Liver Function Tests: Recent Labs  Lab 12/12/18 1056 12/14/18 1317 12/15/18 0841 12/16/18 0826 12/17/18 0350  AST 48* 55* 38  38 37 38  ALT 53* 57* 45*  45* 42 38  ALKPHOS 317* 354* 221*  227* 255* 296*  BILITOT 0.5 1.5* 1.9*  1.8* 1.1 1.1  PROT 7.7 7.7 6.5  6.6 6.2* 6.4*  ALBUMIN 3.4* 3.4* 3.0*  3.0* 2.9* 2.9*   Recent Labs  Lab 12/14/18 1332  LIPASE 65*  AMYLASE 53   No results for input(s): AMMONIA in the last 168 hours. Coagulation Profile: Recent Labs  Lab 12/15/18 0941  INR 1.0   Cardiac Enzymes: No results for input(s): CKTOTAL, CKMB, CKMBINDEX, TROPONINI in the last 168 hours. BNP (last 3 results) No results for input(s): PROBNP in the last 8760 hours. HbA1C: No results for input(s): HGBA1C in the last 72 hours. CBG: Recent Labs  Lab 12/15/18 2058  GLUCAP 94   Lipid Profile: No results for  input(s): CHOL, HDL, LDLCALC, TRIG, CHOLHDL, LDLDIRECT in the last 72 hours. Thyroid Function Tests: No results for input(s): TSH, T4TOTAL, FREET4, T3FREE, THYROIDAB in the last 72 hours. Anemia Panel: No results for input(s): VITAMINB12, FOLATE, FERRITIN, TIBC, IRON, RETICCTPCT in the last 72 hours. Sepsis Labs: No results for input(s): PROCALCITON, LATICACIDVEN in the last 168 hours.  Recent Results (from the past 240 hour(s))  Culture, Blood     Status: None (Preliminary result)   Collection Time: 12/14/18  1:27 PM  Result Value Ref Range Status   Specimen Description   Final    BLOOD LEFT ARM Performed at [facility], [address], [place], [place] [address]    Special Requests   Final    BOTTLES DRAWN AEROBIC AND ANAEROBIC Blood Culture results may not be optimal due to an excessive volume of blood received in culture bottles   Culture   Final    NO GROWTH 4 DAYS Performed at [facility], [place] [address] [place], [place] [address]    Report Status PENDING  Incomplete  Culture, Blood     Status: None (Preliminary result)   Collection Time: 12/14/18  1:50 PM  Result Value Ref Range Status   Specimen Description BLOOD PORTA CATH  Final   Special Requests   Final    BOTTLES DRAWN AEROBIC AND ANAEROBIC Blood Culture adequate volume   Culture   Final    NO GROWTH 4 DAYS Performed at [facility], [place] [address] [place], [place] [address]    Report Status PENDING  Incomplete  Urine Culture     Status: Abnormal   Collection Time: 12/14/18  2:38 PM  Result Value Ref Range Status   Specimen Description   Final    URINE, CLEAN CATCH Performed at [facility], [place] [address] [place], [place] [address]    Special Requests   Final    NONE Performed at [facility], [place] [address] [place], [place] [address]    Culture (A)  Final    <10,000 COLONIES/mL INSIGNIFICANT GROWTH Performed at  [place] [address] [place], [place]  [address]    Report Status 12/15/2018 FINAL  Final  Aerobic/Anaerobic Culture (surgical/deep wound)     Status: None (Preliminary result)   Collection Time: 12/15/18  2:57 PM  Result Value Ref Range Status   Specimen Description   Final    BILE Performed at [place] [address], [place], [place] [address]    Special Requests   Final    NONE Performed at [facility], [place] [address], [place], [place] [address]    Gram Stain   Final    ABUNDANT WBC PRESENT, PREDOMINANTLY PMN NO ORGANISMS SEEN    Culture   Final    NO GROWTH 2 DAYS NO ANAEROBES ISOLATED; CULTURE IN PROGRESS FOR 5 DAYS Performed at [place] [address] [place], [place] [address]    Report Status PENDING  Incomplete       Radiology Studies: No results found.    Scheduled Meds: . amoxicillin-clavulanate  1 tablet Oral Q12H  . gabapentin  100 mg Oral BID  . lip balm  1 application Topical BID  . potassium chloride  40 mEq Oral Once  . sodium chloride flush  10-40 mL Intracatheter Q12H   Continuous Infusions: . sodium chloride 250 mL (12/15/18 2206)  . methocarbamol (ROBAXIN) IV    . ondansetron (ZOFRAN) IV       LOS: 4 days    Time spent: 25 minutes   Dessa Phi, DO [facility] [email or web address] 12/18/2018, 9:20 AM

## 2018-12-18 NOTE — Progress Notes (Signed)
Referring Physician(s): Jeanmarie Hubert  Supervising Physician: Aletta Edouard  Patient Status:  [facility] - In-pt  Chief Complaint: Bleeding from percutaneous cholecystostomy.  Subjective:  Patient reports that drainage in bag is still bloody but is more thin/clear today. She tells me that she is very concerned about caring for her drain at home - she wants to be sure she is shown again how to take care of it prior to discharge. She is states that RN attempted to change the bag because they were unable to empty it due to clots and had to pull the clots out of the bottom using scissors and tweezers yesterday. Today the bag drained without issue. She has not noticed any bleeding from insertion site. She is hopeful she can go home today.  Allergies: Patient has no known allergies.  Medications: Prior to Admission medications   Medication Sig Start Date End Date Taking? Authorizing Provider  b complex vitamins tablet Take 1 tablet by mouth daily.   Yes [provider]  gabapentin (NEURONTIN) 100 MG capsule Take 1 capsule (100 mg total) by mouth 3 (three) times daily. Patient taking differently: Take 100 mg by mouth 2 (two) times daily.  06/03/18  Yes [name], MD  potassium chloride (K-DUR) 10 MEQ tablet Take 1 tablet (10 mEq total) by mouth 4 (four) times daily. 06/08/18  Yes [name], MD  traMADol (ULTRAM) 50 MG tablet Take 1 tablet (50 mg total) by mouth every 6 (six) hours as needed. Patient taking differently: Take 50 mg by mouth every 6 (six) hours as needed for moderate pain or severe pain.  08/22/18  Yes [name], MD  loperamide (IMODIUM) 2 MG capsule Take 1 capsule (2 mg total) by mouth as needed for diarrhea or loose stools. 01/15/18   Lavina Hamman, MD  loratadine (CLARITIN) 10 MG tablet Take 10 mg by mouth daily as needed for allergies.    [provider]  magic mouthwash w/lidocaine SOLN Take 10 mLs by mouth 3 (three) times daily as needed for mouth pain. Swish  and spit 10 ML by mouth 3 times daily as needed for mouth pain. 08/31/17   Alla Feeling, NP  meclizine (ANTIVERT) 25 MG tablet Take 25 mg by mouth 3 (three) times daily as needed for dizziness.    [provider]  ondansetron (ZOFRAN-ODT) 8 MG disintegrating tablet TAKE 1 TABLET BY MOUTH EVERY 8 HOURS AS NEEDED FOR NAUSE OR VOMITING Patient taking differently: Take 8 mg by mouth every 8 (eight) hours as needed for nausea or vomiting.  01/25/18   Alla Feeling, NP  prochlorperazine (COMPAZINE) 10 MG tablet Take 1 tablet (10 mg total) by mouth every 6 (six) hours as needed for nausea or vomiting. 12/06/17   Alla Feeling, NP     Vital Signs: BP 93/62 (BP Location: Left Arm)    Pulse 74    Temp 98.3 F (36.8 C) (Oral)    Resp 18    Ht 5\' 5"  (1.651 m)    Wt 104 lb 0.9 oz (47.2 kg)    LMP 10/23/2015    SpO2 97%    BMI 17.32 kg/m   Physical Exam Vitals signs and nursing note reviewed.  Constitutional:      General: She is not in acute distress.    Appearance: She is ill-appearing.  HENT:     Head: Normocephalic.  Cardiovascular:     Rate and Rhythm: Normal rate.  Pulmonary:  Effort: Pulmonary effort is normal.  Abdominal:     Palpations: Abdomen is soft.     Tenderness: There is no abdominal tenderness.     Comments: (+) RUQ drain insertion site clean, dry, dressed appropriately. No tenderness to palpation. Drain to gravity with thin, bloody output. Flushes easily with some clot in drain tubing - does not appear to be preventing appropriate drainage.   Skin:    General: Skin is warm and dry.  Neurological:     Mental Status: She is alert. Mental status is at baseline.     Imaging: Dg Chest 2 View  Result Date: 12/14/2018 CLINICAL DATA:  Abdominal pain EXAM: CHEST - 2 VIEW COMPARISON:  09/01/2018 PET FINDINGS: Right jugular Port-A-Cath with its tip at the cavoatrial junction is stable. Normal heart size. Clear lungs. No pneumothorax or pleural effusion. IMPRESSION: No  active cardiopulmonary disease. Electronically Signed   By: Marybelle Killings M.D.   On: 12/14/2018 16:08   Ct Chest W Contrast  Addendum Date: 12/14/2018   ADDENDUM REPORT: 12/14/2018 19:17 ADDENDUM: Addendum created for clarification of findings related to the gallbladder. Distended gallbladder with inflammatory changes compatible with acute cholecystitis. The obstructive calcified stone is likely in the infundibulum/proximal cystic duct, not within common bile duct. No calcified stones are visualized within the common bile duct. Findings discussed with Dr. Johney Maine of general surgery. Electronically Signed   By: Corrie Mckusick D.O.   On: 12/14/2018 19:17   Result Date: 12/14/2018 CLINICAL DATA:  58 year old female with a history of metastatic pancreatic cancer with right upper quadrant pain and fever EXAM: CT CHEST, ABDOMEN, AND PELVIS WITH CONTRAST TECHNIQUE: Multidetector CT imaging of the chest, abdomen and pelvis was performed following the standard protocol during bolus administration of intravenous contrast. CONTRAST:  178mL OMNIPAQUE IOHEXOL 300 MG/ML  SOLN COMPARISON:  Multiple prior PET CT most recent 09/01/2018, most recent CT abdomen 10/14/2017 FINDINGS: CT CHEST FINDINGS Cardiovascular: Heart size within normal limits. No pericardial fluid/thickening. Unremarkable course caliber contour of the thoracic aorta. Unremarkable diameter of the main pulmonary artery. No proximal filling defects. No significant atherosclerosis. Port catheter on the right chest wall with IJ approach. Mediastinum/Nodes: No adenopathy. Unremarkable appearance of the thoracic esophagus. Unremarkable thoracic inlet. Lungs/Pleura: No pneumothorax or pleural effusion. No confluent airspace disease. Atelectatic changes/scarring bilateral lungs, predominantly in the dependent lung bases, lingula, right middle lobe. Musculoskeletal: No acute displaced fracture. No significant degenerative changes. No bony canal narrowing. CT ABDOMEN  PELVIS FINDINGS Hepatobiliary: There are treatment changes within the liver, with multiple regions of necrotic/cystic change, new from the baseline CT of 07/23/2017. Compared to the most recent PET/CT they are has been significant interval decrease in size of all of the lesions previously noted, involving all liver segments. Index lesion in segment 4 B measures 5.8 cm with internal necrosis. Index lesion within segment 5/6 measuring 5.7 cm, with internal necrosis. Index lesion segment 8 measures 3.2 cm with internal necrosis. Hypervascular region on the margin of a treated lesion within segment 5 (image 122 of series 3) likely perfusion anomaly/Thad. Distention of the gallbladder with gallbladder wall thickening and multiple intrahepatic stones. Calcified stone in the hilum of the liver, likely within the cystic duct. No calcified stone is identified within the distal common bile duct. Pancreas: Unchanged appearance of the pancreas with mild dilation of the pancreatic duct in the body of the pancreas. No inflammatory changes or calcified parenchyma. Spleen: Unremarkable spleen Adrenals/Urinary Tract: Unremarkable appearance of the adrenal glands. No evidence of  hydronephrosis of the right or left kidney. No nephrolithiasis. Unremarkable course of the bilateral ureters. Unremarkable appearance of the urinary bladder. Stomach/Bowel: Unremarkable stomach. Small bowel unremarkable without abnormal distention or focal inflammatory changes. Enteric contrast reaches the terminal ileum. Moderate stool burden. No evidence of obstruction. Vascular/Lymphatic: No significant atherosclerotic changes. Edema within the fat at the liver hilum, surrounding the gallbladder within the right abdomen, with mild edema within the mesenteric fat of the mid abdomen. No adenopathy. Reproductive: Unremarkable uterus. Other: None Musculoskeletal: No acute displaced fracture. IMPRESSION: CT demonstrates acute cholecystitis, with  choledocholithiasis of the cystic duct. These results were discussed by telephone at the time of interpretation on 12/14/2018 at 5:05 pm with Dr. Burr Medico. Redemonstration of known liver metastases, with positive response to therapy, interval reduction in size of all lesions, with multiple demonstrating significant internal necrosis. No acute finding of the chest. Ancillary findings as above. Electronically Signed: By: Corrie Mckusick D.O. On: 12/14/2018 17:12   Ct Abdomen Pelvis W Contrast  Addendum Date: 12/14/2018   ADDENDUM REPORT: 12/14/2018 19:17 ADDENDUM: Addendum created for clarification of findings related to the gallbladder. Distended gallbladder with inflammatory changes compatible with acute cholecystitis. The obstructive calcified stone is likely in the infundibulum/proximal cystic duct, not within common bile duct. No calcified stones are visualized within the common bile duct. Findings discussed with Dr. Johney Maine of general surgery. Electronically Signed   By: Corrie Mckusick D.O.   On: 12/14/2018 19:17   Result Date: 12/14/2018 CLINICAL DATA:  58 year old female with a history of metastatic pancreatic cancer with right upper quadrant pain and fever EXAM: CT CHEST, ABDOMEN, AND PELVIS WITH CONTRAST TECHNIQUE: Multidetector CT imaging of the chest, abdomen and pelvis was performed following the standard protocol during bolus administration of intravenous contrast. CONTRAST:  134mL OMNIPAQUE IOHEXOL 300 MG/ML  SOLN COMPARISON:  Multiple prior PET CT most recent 09/01/2018, most recent CT abdomen 10/14/2017 FINDINGS: CT CHEST FINDINGS Cardiovascular: Heart size within normal limits. No pericardial fluid/thickening. Unremarkable course caliber contour of the thoracic aorta. Unremarkable diameter of the main pulmonary artery. No proximal filling defects. No significant atherosclerosis. Port catheter on the right chest wall with IJ approach. Mediastinum/Nodes: No adenopathy. Unremarkable appearance of the  thoracic esophagus. Unremarkable thoracic inlet. Lungs/Pleura: No pneumothorax or pleural effusion. No confluent airspace disease. Atelectatic changes/scarring bilateral lungs, predominantly in the dependent lung bases, lingula, right middle lobe. Musculoskeletal: No acute displaced fracture. No significant degenerative changes. No bony canal narrowing. CT ABDOMEN PELVIS FINDINGS Hepatobiliary: There are treatment changes within the liver, with multiple regions of necrotic/cystic change, new from the baseline CT of 07/23/2017. Compared to the most recent PET/CT they are has been significant interval decrease in size of all of the lesions previously noted, involving all liver segments. Index lesion in segment 4 B measures 5.8 cm with internal necrosis. Index lesion within segment 5/6 measuring 5.7 cm, with internal necrosis. Index lesion segment 8 measures 3.2 cm with internal necrosis. Hypervascular region on the margin of a treated lesion within segment 5 (image 122 of series 3) likely perfusion anomaly/Thad. Distention of the gallbladder with gallbladder wall thickening and multiple intrahepatic stones. Calcified stone in the hilum of the liver, likely within the cystic duct. No calcified stone is identified within the distal common bile duct. Pancreas: Unchanged appearance of the pancreas with mild dilation of the pancreatic duct in the body of the pancreas. No inflammatory changes or calcified parenchyma. Spleen: Unremarkable spleen Adrenals/Urinary Tract: Unremarkable appearance of the adrenal glands. No evidence of  hydronephrosis of the right or left kidney. No nephrolithiasis. Unremarkable course of the bilateral ureters. Unremarkable appearance of the urinary bladder. Stomach/Bowel: Unremarkable stomach. Small bowel unremarkable without abnormal distention or focal inflammatory changes. Enteric contrast reaches the terminal ileum. Moderate stool burden. No evidence of obstruction. Vascular/Lymphatic: No  significant atherosclerotic changes. Edema within the fat at the liver hilum, surrounding the gallbladder within the right abdomen, with mild edema within the mesenteric fat of the mid abdomen. No adenopathy. Reproductive: Unremarkable uterus. Other: None Musculoskeletal: No acute displaced fracture. IMPRESSION: CT demonstrates acute cholecystitis, with choledocholithiasis of the cystic duct. These results were discussed by telephone at the time of interpretation on 12/14/2018 at 5:05 pm with Dr. Burr Medico. Redemonstration of known liver metastases, with positive response to therapy, interval reduction in size of all lesions, with multiple demonstrating significant internal necrosis. No acute finding of the chest. Ancillary findings as above. Electronically Signed: By: Corrie Mckusick D.O. On: 12/14/2018 17:12   Ir Perc Cholecystostomy  Result Date: 12/15/2018 INDICATION: 58 year old with metastatic pancreatic cancer and acute cholecystitis. Patient is not a surgical candidate. EXAM: PLACEMENT OF CHOLECYSTOSTOMY TUBE WITH ULTRASOUND AND FLUOROSCOPIC GUIDANCE MEDICATIONS: Inpatient receiving scheduled IV antibiotics. ANESTHESIA/SEDATION: Moderate (conscious) sedation was employed during this procedure. A total of Versed 2.0 mg and Fentanyl 100 mcg was administered intravenously. Moderate Sedation Time: 18 minutes minutes. The patient's level of consciousness and vital signs were monitored continuously by radiology nursing throughout the procedure under my direct supervision. FLUOROSCOPY TIME:  Fluoroscopy Time: 54 seconds, 3 mGy CONTRAST:  5 mL Omnipaque 458 COMPLICATIONS: None immediate. PROCEDURE: Informed written consent was obtained from the patient after a thorough discussion of the procedural risks, benefits and alternatives. All questions were addressed. Maximal Sterile Barrier Technique was utilized including caps, mask, sterile gowns, sterile gloves, sterile drape, hand hygiene and skin antiseptic. A timeout was  performed prior to the initiation of the procedure. Right abdomen was prepped and draped in sterile fashion. Ultrasound was used to identify an abnormal gallbladder. Skin was anesthetized with 1% lidocaine. Small skin incision was made. 21 gauge needle was directed into the gallbladder with ultrasound guidance. 0.018 wire was advanced into the gallbladder and a transitional dilator set was placed. Stiff Amplatz wire was advanced into the gallbladder and the tract was dilated to accommodate a 10.2 [place] multipurpose drain. 80 mL of bloody bilious thick fluid was removed from the gallbladder. Fluid sample sent for culture. Catheter was sutured to skin and attached to a gravity bag. FINDINGS: Gallbladder is markedly abnormal. Gallbladder is distended with wall thickening. Extensive echogenic material in the gallbladder compatible with stones and sludge. Small amount of pericholecystic fluid or ascites. 10.2 French drain was successfully advanced into the gallbladder. 80 mL of bloody bilious fluid was removed from the gallbladder. IMPRESSION: Successful image guided cholecystostomy tube placement. Electronically Signed   By: Markus Daft M.D.   On: 12/15/2018 15:00   Vas [place] Lower Extremity Venous (dvt)  Result Date: 12/15/2018  Lower Venous Study Indications: Edema. Left greater than right  Risk Factors: Cancer Pancreatic. Performing Technologist: Toma Copier RVS  Examination Guidelines: A complete evaluation includes B-mode imaging, spectral Doppler, color Doppler, and power Doppler as needed of all accessible portions of each vessel. Bilateral testing is considered an integral part of a complete examination. Limited examinations for reoccurring indications may be performed as noted.  +-----+---------------+---------+-----------+----------+-------+  RIGHT Compressibility Phasicity Spontaneity Properties Summary  +-----+---------------+---------+-----------+----------+-------+  CFV   Full  Yes        Yes                             +-----+---------------+---------+-----------+----------+-------+  SFJ   Full                                                      +-----+---------------+---------+-----------+----------+-------+   +---------+---------------+---------+-----------+----------+------------------+  LEFT      Compressibility Phasicity Spontaneity Properties Summary             +---------+---------------+---------+-----------+----------+------------------+  CFV       Full            Yes       Yes                                        +---------+---------------+---------+-----------+----------+------------------+  SFJ       Full                                                                 +---------+---------------+---------+-----------+----------+------------------+  FV Prox   Full            Yes       Yes                                        +---------+---------------+---------+-----------+----------+------------------+  FV Mid    Full                                                                 +---------+---------------+---------+-----------+----------+------------------+  FV Distal Full            Yes       Yes                                        +---------+---------------+---------+-----------+----------+------------------+  PFV       Full            Yes       Yes                                        +---------+---------------+---------+-----------+----------+------------------+  POP       Full            Yes       Yes                                        +---------+---------------+---------+-----------+----------+------------------+  PTV       Partial                                          Acute               +---------+---------------+---------+-----------+----------+------------------+  PERO      Full                                             Difficult to image  +---------+---------------+---------+-----------+----------+------------------+   Left Technical Findings: Acute DVT  noted in one of the paired posterior tibial veins in the distal calf   Summary: Right: There is no evidence of a common femoral vein obstruction. Left: Findings consistent with acute deep vein thrombosis involving the left posterior tibial vein. See technical findings listed above  *See table(s) above for measurements and observations. Electronically signed by Servando Snare MD on 12/15/2018 at 5:10:58 PM.    Final     Labs:  CBC: Recent Labs    12/15/18 0841 12/16/18 0826 12/16/18 2023 12/17/18 0350 12/18/18 0410  WBC 5.9   5.9 4.0  --  2.6* 2.1*  HGB 7.1*   7.2* 6.5* 8.1* 7.4* 7.6*  HCT 21.4*   21.3* 20.1* 23.8* 22.2* 22.6*  PLT 93*   90* 91*  --  75* 67*    COAGS: Recent Labs    12/15/18 0941  INR 1.0  APTT 46*    BMP: Recent Labs    12/15/18 0841 12/16/18 0826 12/17/18 0350 12/18/18 0410  NA 135   135 137 138 137  K 3.2*   3.3* 3.4* 3.5 3.4*  CL 100   100 103 102 102  CO2 28   28 26 26 27   GLUCOSE 98   97 100* 96 98  BUN 22*   22* 17 19 17   CALCIUM 8.6*   8.6* 8.5* 8.9 8.9  CREATININE 0.96   0.99 0.99 1.15* 1.06*  GFRNONAA >60   >60 >60 53* 59*  GFRAA >60   >60 >60 >60 >60    LIVER FUNCTION TESTS: Recent Labs    12/14/18 1317 12/15/18 0841 12/16/18 0826 12/17/18 0350  BILITOT 1.5* 1.9*   1.8* 1.1 1.1  AST 55* 38   38 37 38  ALT 57* 45*   45* 42 38  ALKPHOS 354* 221*   227* 255* 296*  PROT 7.7 6.5   6.6 6.2* 6.4*  ALBUMIN 3.4* 3.0*   3.0* 2.9* 2.9*    Assessment and Plan:  58 y/o F s/p percutaneous cholecystostomy 12/15/18 in IR. Output remains bloody although per patient more clear/thin today after IV heparin was held. Concern for clots in bag preventing emptying yesterday, RN unable to obtain appropriate replacement bag last night. No issues emptying bag this morning per patient. Afebrile, WBC 2.1, hgb 7.6, plt 67.  Per I/O 150 cc OP today, aspirate culture pending. Drain flushes easily on exam today.  Patient expressed many concerns to me today  regarding drain care after discharge - I have demonstrated drain flushing with her today, discussed PRN dressing changes, covering drain during showering as best as possible. I ensured her that she will receive written care instructions upon discharge, which I have placed in her discharge summary today. I have also exchanged  her gravity bag and provided a bag with some supplies for at home drain care given her great anxiety about obtaining appropriate materials and home care.   Continue current management while inpatient. Upon discharge patient will need to flush drain once daily with  5 cc NS, empty gravity bag at least once daily (may empty as often as she needs), record total output once daily. She will follow up with IR in 8 weeks if she has not undergone cholecystectomy before then.   Electronically Signed: Joaquim Nam, PA-C 12/18/2018, 2:31 PM   I spent a total of 25 Minutes at the the patient's bedside AND on the patient's hospital floor or unit, greater than 50% of which was counseling/coordinating care for percutaneous cholecystostomy follow up.

## 2018-12-18 NOTE — Progress Notes (Signed)
Assessment & Plan: Acute calculous cholecystitis  Perc cholecystostomy tube placed 3days ago  Monitor output - sang this AM  On abx  Cultures NTD  Tolerating diet  Generally rec total of 2 weeks of abx for cholecystitis (doesn't  have to be IV)  reDiscussed GB mgmt with cholecystostomy tube and drew  diagrams Stage IV pancreatic ca  Continue chemo Rx Acute LLE DVT - primary team holding anticoag due to ongoing bleeding for now; VS stable; discussed with [facility] about considering IVC filter given pt's underlying malignancy. Will defer to [facility] and oncology about mgmt Anemia - hgb 6.5-->1u-->8.1-->7.4-->7.6 this am; repeat in AM to make sure stable; even though still having sang output from biliary drain hgb is stable      Morgan Dawson. Morgan Pulling, MD, FACS General, Bariatric, & Minimally Invasive Surgery [facility], [place]    Chief Complaint: Acute cholecystitis  Subjective: Patient in bed, bright, alert, mild pain around drain when had some coughing. No n/v. Eating well.  Nose bleed this am 225 out of biliary drain  Objective: Vital signs in last 24 hours: Temp:  [98.3 F (36.8 C)-98.7 F (37.1 C)] 98.6 F (37 C) (04/26 0535) Pulse Rate:  [73-84] 73 (04/26 0535) Resp:  [18-19] 19 (04/26 0535) BP: (94-115)/(66-73) 94/66 (04/26 0535) SpO2:  [96 %-98 %] 96 % (04/26 0535) Last BM Date: 12/16/18  Intake/Output from previous day: 04/25 0701 - 04/26 0700 In: 340 [P.O.:340] Out: 225 [Drains:225] Intake/Output this shift: No intake/output data recorded.  Physical Exam: HEENT - sclerae clear, mucous membranes moist Abdomen - soft, mild distension; some RUQ TTP around drain; perc drain with sanguinous output Ext - no edema, non-tender Neuro - alert & oriented, no focal deficits  Lab Results:  Recent Labs    12/17/18 0350 12/18/18 0410  WBC 2.6* 2.1*  HGB 7.4* 7.6*  HCT 22.2* 22.6*  PLT 75* 67*   BMET Recent Labs    12/17/18 0350 12/18/18 0410  NA 138 137   K 3.5 3.4*  CL 102 102  CO2 26 27  GLUCOSE 96 98  BUN 19 17  CREATININE 1.15* 1.06*  CALCIUM 8.9 8.9   PT/INR Recent Labs    12/15/18 0941  LABPROT 13.4  INR 1.0   Comprehensive Metabolic Panel:    Component Value Date/Time   NA 137 12/18/2018 0410   NA 138 12/17/2018 0350   NA 134 (L) 08/23/2017 0951   NA 132 (L) 08/20/2017 1257   K 3.4 (L) 12/18/2018 0410   K 3.5 12/17/2018 0350   K 2.5 Repeated and Verified (LL) 08/23/2017 0951   K 3.6 08/20/2017 1257   CL 102 12/18/2018 0410   CL 102 12/17/2018 0350   CO2 27 12/18/2018 0410   CO2 26 12/17/2018 0350   CO2 26 08/23/2017 0951   CO2 29 08/20/2017 1257   BUN 17 12/18/2018 0410   BUN 19 12/17/2018 0350   BUN 11.6 08/23/2017 0951   BUN 17.5 08/20/2017 1257   CREATININE 1.06 (H) 12/18/2018 0410   CREATININE 1.15 (H) 12/17/2018 0350   CREATININE 1.04 (H) 12/12/2018 1056   CREATININE 1.61 (H) 01/12/2018 1130   CREATININE 1.0 08/23/2017 0951   CREATININE 1.4 (H) 08/20/2017 1257   GLUCOSE 98 12/18/2018 0410   GLUCOSE 96 12/17/2018 0350   GLUCOSE 116 08/23/2017 0951   GLUCOSE 114 08/20/2017 1257   CALCIUM 8.9 12/18/2018 0410   CALCIUM 8.9 12/17/2018 0350   CALCIUM 10.4 08/23/2017 0951  CALCIUM 14.2 (HH) 08/20/2017 1257   AST 38 12/17/2018 0350   AST 37 12/16/2018 0826   AST 48 (H) 12/12/2018 1056   AST 49 (H) 01/12/2018 1130   AST 208 (HH) 08/23/2017 0951   AST 221 (HH) 08/20/2017 1257   ALT 38 12/17/2018 0350   ALT 42 12/16/2018 0826   ALT 53 (H) 12/12/2018 1056   ALT 44 01/12/2018 1130   ALT 151 (H) 08/23/2017 0951   ALT 170 (H) 08/20/2017 1257   ALKPHOS 296 (H) 12/17/2018 0350   ALKPHOS 255 (H) 12/16/2018 0826   ALKPHOS 549 (H) 08/23/2017 0951   ALKPHOS 531 (H) 08/20/2017 1257   BILITOT 1.1 12/17/2018 0350   BILITOT 1.1 12/16/2018 0826   BILITOT 0.5 12/12/2018 1056   BILITOT 1.1 01/12/2018 1130   BILITOT 0.82 08/23/2017 0951   BILITOT 1.92 (H) 08/20/2017 1257   PROT 6.4 (L) 12/17/2018 0350   PROT  6.2 (L) 12/16/2018 0826   PROT 6.4 08/23/2017 0951   PROT 7.2 08/20/2017 1257   ALBUMIN 2.9 (L) 12/17/2018 0350   ALBUMIN 2.9 (L) 12/16/2018 0826   ALBUMIN 2.9 (L) 08/23/2017 0951   ALBUMIN 3.4 (L) 08/20/2017 1257    Studies/Results: No results found.    Morgan Dawson 12/18/2018  Patient ID: Morgan Dawson, female   DOB: September 28, 1960, 58 y.o.   MRN: [id number]

## 2018-12-18 NOTE — Progress Notes (Signed)
These preliminary result these preliminary results were noted.  Awaiting final report.

## 2018-12-19 ENCOUNTER — Telehealth: Payer: Self-pay | Admitting: Hematology

## 2018-12-19 LAB — CBC
HCT: 22.4 % — ABNORMAL LOW (ref 36.0–46.0)
Hemoglobin: 7.4 g/dL — ABNORMAL LOW (ref 12.0–15.0)
MCH: 34.6 pg — ABNORMAL HIGH (ref 26.0–34.0)
MCHC: 33 g/dL (ref 30.0–36.0)
MCV: 104.7 fL — ABNORMAL HIGH (ref 80.0–100.0)
Platelets: 62 10*3/uL — ABNORMAL LOW (ref 150–400)
RBC: 2.14 MIL/uL — ABNORMAL LOW (ref 3.87–5.11)
RDW: 15.7 % — ABNORMAL HIGH (ref 11.5–15.5)
WBC: 4.8 10*3/uL (ref 4.0–10.5)
nRBC: 0.4 % — ABNORMAL HIGH (ref 0.0–0.2)

## 2018-12-19 LAB — BASIC METABOLIC PANEL
Anion gap: 8 (ref 5–15)
BUN: 18 mg/dL (ref 6–20)
CO2: 26 mmol/L (ref 22–32)
Calcium: 9 mg/dL (ref 8.9–10.3)
Chloride: 103 mmol/L (ref 98–111)
Creatinine, Ser: 1.03 mg/dL — ABNORMAL HIGH (ref 0.44–1.00)
GFR calc Af Amer: >60 mL/min (ref >60)
GFR calc non Af Amer: >60 mL/min (ref >60)
Glucose, Bld: 96 mg/dL (ref 70–99)
Potassium: 3.7 mmol/L (ref 3.5–5.1)
Sodium: 137 mmol/L (ref 135–145)

## 2018-12-19 LAB — CULTURE, BLOOD (SINGLE)
Culture: NO GROWTH
Culture: NO GROWTH
Special Requests: ADEQUATE

## 2018-12-19 MED ORDER — HEPARIN SOD (PORK) LOCK FLUSH 100 UNIT/ML IV SOLN
500.0000 [IU] | INTRAVENOUS | Status: AC | PRN
  Administered 2018-12-19: 500 [IU]
  Filled 2018-12-19: qty 5

## 2018-12-19 MED ORDER — AMOXICILLIN-POT CLAVULANATE 875-125 MG PO TABS: 1.0000 | ORAL_TABLET | Freq: Two times a day (BID) | ORAL | 0 refills | Status: AC

## 2018-12-19 MED ORDER — RIVAROXABAN 15 MG PO TABS: 15.0000 mg | ORAL_TABLET | Freq: Every day | ORAL | 0 refills | Status: DC

## 2018-12-19 MED ORDER — OXYCODONE HCL 5 MG PO TABS: 5.0000 mg | ORAL_TABLET | ORAL | 0 refills | Status: AC | PRN

## 2018-12-19 NOTE — Telephone Encounter (Signed)
Added and cancelled appointments per 4/27 schedule message. Spoke with patient.

## 2018-12-19 NOTE — Discharge Summary (Signed)
Physician Discharge Summary  Morgan Dawson:[id number] DOB: June 14, 1961 DOA: 12/14/2018  PCP: [name], MD  Admit date: 12/14/2018 Discharge date: 12/19/2018  Admitted From: Home Disposition:  Home  Recommendations for Outpatient Follow-up:  1. Follow up with Dr. Burr Medico next week 2. Follow up CBC 2-3 days  3. Follow up with IR in 8 weeks. Flush drain once daily with 5cc NS, empty gravity bag daily, record total output daily.  4. Follow up with general surgery on 01/17/2019 5. Repeat TSH in 4-6 weeks as outpatient   Discharge Condition: Stable CODE STATUS: Full  Diet recommendation: Regular diet   Brief/Interim Summary: Morgan Dawson is a 58 year old female with medical history significant for pancreatic cancer with metastases to the liver, currently on chemotherapy.  She presents with 2-day history of abdominal pain, low-grade fevers.  She admits to abdominal pain radiating to her back, worse in the right upper quadrant.  Denies any nausea or diarrhea.  She presented to the oncology office, underwent CT of the abdomen and pelvis which revealed cholecystitis.  She was referred to the emergency department at that time.  Patient admitted for acute cholecystitis, started on IV Zosyn with general surgery consulted.  Patient underwent percutaneous cholecystostomy tube placement by IR 4/23. Also dx with LLE DVT and started on IV heparin. She had bleeding in her percutaneous cholecystostomy tube since the procedure.  We stopped IV heparin. She required 1u pRBC transfusion. After discussion with oncology, she was started on lower dose of Xarelto and recommended to follow up closely to watch CBC.   Discharge Diagnoses:  Principal Problem:   Acute calculous cholecystitis Active Problems:   Pancreatic cancer ([facility])   Pancreatic cancer metastasized to liver ([facility])   Port-A-Cath in place   CKD (chronic kidney disease), stage III ([facility])   Anemia due to antineoplastic chemotherapy   Hypokalemia    Thrombocytopenia ([facility])   Vertigo   Meniere's disease of right ear   Liver metastases - presumed pancreatic cancer primary   Cholelithiasis - calcified gallstones   On antineoplastic chemotherapy   Inguinal hernia, left   Hemangioma of liver, right lobe segment 5   Leg edema, left   Acute calculous cholecystitis -General surgery following -S/p percutaneous cholecystostomy tube placement by IR 4/23.  Removed 80 mL thick bloody bilious fluid, fluid culture no growth to date  -IV Zosyn transition to Augmentin.  We will continue for total 2-week therapy  Pancreatic cancer with mets to the liver -Followed by Dr. Burr Medico, oncology  Left lower leg DVT -She was started on IV heparin, this has been stopped due to bleeding in her cholecystostomy tube. Started on lower dose of Xarelto   Acute blood loss anemia on chronic macrocytic anemia -Baseline hemoglobin around 9, noted also to be pancytopenic while on chemotherapy -Received 1 unit packed red blood cells 4/24 -Monitor CBC, Hgb stable today 7.4  CKD stage III -Creatinine remains stable  Elevated liver enzymes -Resolved  Elevated TSH -Repeat labs as an outpatient in several weeks    Discharge Instructions  Discharge Instructions    Call MD for:  extreme fatigue   Complete by:  As directed    Call MD for:  hives   Complete by:  As directed    Call MD for:  persistant nausea and vomiting   Complete by:  As directed    Call MD for:  redness, tenderness, or signs of infection (pain, swelling, redness, odor or green/yellow discharge around incision site)   Complete  by:  As directed    Call MD for:  severe uncontrolled pain   Complete by:  As directed    Call MD for:  temperature >100.4   Complete by:  As directed    Diet - low sodium heart healthy   Complete by:  As directed    Increase activity slowly   Complete by:  As directed      Allergies as of 12/19/2018   No Known Allergies     Medication List    TAKE  these medications   amoxicillin-clavulanate 875-125 MG tablet Commonly known as:  AUGMENTIN Take 1 tablet by mouth every 12 (twelve) hours for 9 days.   b complex vitamins tablet Take 1 tablet by mouth daily.   gabapentin 100 MG capsule Commonly known as:  Neurontin Take 1 capsule (100 mg total) by mouth 3 (three) times daily. What changed:  when to take this   loperamide 2 MG capsule Commonly known as:  IMODIUM Take 1 capsule (2 mg total) by mouth as needed for diarrhea or loose stools.   loratadine 10 MG tablet Commonly known as:  CLARITIN Take 10 mg by mouth daily as needed for allergies.   magic mouthwash w/lidocaine Soln Take 10 mLs by mouth 3 (three) times daily as needed for mouth pain. Swish and spit 10 ML by mouth 3 times daily as needed for mouth pain.   meclizine 25 MG tablet Commonly known as:  ANTIVERT Take 25 mg by mouth 3 (three) times daily as needed for dizziness.   ondansetron 8 MG disintegrating tablet Commonly known as:  ZOFRAN-ODT TAKE 1 TABLET BY MOUTH EVERY 8 HOURS AS NEEDED FOR NAUSE OR VOMITING What changed:  See the new instructions.   oxyCODONE 5 MG immediate release tablet Commonly known as:  Oxy IR/ROXICODONE Take 1 tablet (5 mg total) by mouth every 4 (four) hours as needed for up to 5 days for moderate pain or severe pain.   potassium chloride 10 MEQ tablet Commonly known as:  K-DUR Take 1 tablet (10 mEq total) by mouth 4 (four) times daily.   prochlorperazine 10 MG tablet Commonly known as:  COMPAZINE Take 1 tablet (10 mg total) by mouth every 6 (six) hours as needed for nausea or vomiting.   Rivaroxaban 15 MG Tabs tablet Commonly known as:  Xarelto Take 1 tablet (15 mg total) by mouth daily with supper for 30 days.   sodium chloride flush 0.9 % Soln Commonly known as:  NS 5 mLs by Intracatheter route daily.   traMADol 50 MG tablet Commonly known as:  ULTRAM Take 1 tablet (50 mg total) by mouth every 6 (six) hours as needed. What  changed:  reasons to take this      Follow-up Information    Rolm Bookbinder, MD. Call on 01/17/2019.   Specialty:  General Surgery Why:  5/26 at 4:10pm. Due to coronavirus we are decreasing foot traffic in office. Instead of coming to an appt a provider will call you at the above date/time.  Contact information: [address] [place] [address] [phone]        Markus Daft, MD Follow up.   Specialties:  Interventional Radiology, Radiology Why:  IR scheduler will call you with appointment date and time (approximately 8 weeks from placement) -- please call with any questions or concerns before your appointment. Contact information: [address] [place] [address] [phone]        [name],  MD. Schedule an appointment as soon as possible for a visit in 1 week(s).   Specialties:  Hematology, Oncology Why:  Will need repeat CBC in 2-3 days  Contact information: [place] [address] [phone]          No Known Allergies  Consultations:  General surgery  IR  Oncology    Procedures/Studies: Dg Chest 2 View  Result Date: 12/14/2018 CLINICAL DATA:  Abdominal pain EXAM: CHEST - 2 VIEW COMPARISON:  09/01/2018 PET FINDINGS: Right jugular Port-A-Cath with its tip at the cavoatrial junction is stable. Normal heart size. Clear lungs. No pneumothorax or pleural effusion. IMPRESSION: No active cardiopulmonary disease. Electronically Signed   By: Marybelle Killings M.D.   On: 12/14/2018 16:08   Ct Chest W Contrast  Addendum Date: 12/14/2018   ADDENDUM REPORT: 12/14/2018 19:17 ADDENDUM: Addendum created for clarification of findings related to the gallbladder. Distended gallbladder with inflammatory changes compatible with acute cholecystitis. The obstructive calcified stone is likely in the infundibulum/proximal cystic duct, not within common bile duct. No calcified stones are visualized within the common bile duct.  Findings discussed with Dr. Johney Maine of general surgery. Electronically Signed   By: Corrie Mckusick D.O.   On: 12/14/2018 19:17   Result Date: 12/14/2018 CLINICAL DATA:  58 year old female with a history of metastatic pancreatic cancer with right upper quadrant pain and fever EXAM: CT CHEST, ABDOMEN, AND PELVIS WITH CONTRAST TECHNIQUE: Multidetector CT imaging of the chest, abdomen and pelvis was performed following the standard protocol during bolus administration of intravenous contrast. CONTRAST:  148mL OMNIPAQUE IOHEXOL 300 MG/ML  SOLN COMPARISON:  Multiple prior PET CT most recent 09/01/2018, most recent CT abdomen 10/14/2017 FINDINGS: CT CHEST FINDINGS Cardiovascular: Heart size within normal limits. No pericardial fluid/thickening. Unremarkable course caliber contour of the thoracic aorta. Unremarkable diameter of the main pulmonary artery. No proximal filling defects. No significant atherosclerosis. Port catheter on the right chest wall with IJ approach. Mediastinum/Nodes: No adenopathy. Unremarkable appearance of the thoracic esophagus. Unremarkable thoracic inlet. Lungs/Pleura: No pneumothorax or pleural effusion. No confluent airspace disease. Atelectatic changes/scarring bilateral lungs, predominantly in the dependent lung bases, lingula, right middle lobe. Musculoskeletal: No acute displaced fracture. No significant degenerative changes. No bony canal narrowing. CT ABDOMEN PELVIS FINDINGS Hepatobiliary: There are treatment changes within the liver, with multiple regions of necrotic/cystic change, new from the baseline CT of 07/23/2017. Compared to the most recent PET/CT they are has been significant interval decrease in size of all of the lesions previously noted, involving all liver segments. Index lesion in segment 4 B measures 5.8 cm with internal necrosis. Index lesion within segment 5/6 measuring 5.7 cm, with internal necrosis. Index lesion segment 8 measures 3.2 cm with internal necrosis.  Hypervascular region on the margin of a treated lesion within segment 5 (image 122 of series 3) likely perfusion anomaly/Thad. Distention of the gallbladder with gallbladder wall thickening and multiple intrahepatic stones. Calcified stone in the hilum of the liver, likely within the cystic duct. No calcified stone is identified within the distal common bile duct. Pancreas: Unchanged appearance of the pancreas with mild dilation of the pancreatic duct in the body of the pancreas. No inflammatory changes or calcified parenchyma. Spleen: Unremarkable spleen Adrenals/Urinary Tract: Unremarkable appearance of the adrenal glands. No evidence of hydronephrosis of the right or left kidney. No nephrolithiasis. Unremarkable course of the bilateral ureters. Unremarkable appearance of the urinary bladder. Stomach/Bowel: Unremarkable stomach. Small bowel unremarkable without abnormal distention or focal inflammatory changes. Enteric  contrast reaches the terminal ileum. Moderate stool burden. No evidence of obstruction. Vascular/Lymphatic: No significant atherosclerotic changes. Edema within the fat at the liver hilum, surrounding the gallbladder within the right abdomen, with mild edema within the mesenteric fat of the mid abdomen. No adenopathy. Reproductive: Unremarkable uterus. Other: None Musculoskeletal: No acute displaced fracture. IMPRESSION: CT demonstrates acute cholecystitis, with choledocholithiasis of the cystic duct. These results were discussed by telephone at the time of interpretation on 12/14/2018 at 5:05 pm with Dr. Burr Medico. Redemonstration of known liver metastases, with positive response to therapy, interval reduction in size of all lesions, with multiple demonstrating significant internal necrosis. No acute finding of the chest. Ancillary findings as above. Electronically Signed: By: Corrie Mckusick D.O. On: 12/14/2018 17:12   Ct Abdomen Pelvis W Contrast  Addendum Date: 12/14/2018   ADDENDUM REPORT:  12/14/2018 19:17 ADDENDUM: Addendum created for clarification of findings related to the gallbladder. Distended gallbladder with inflammatory changes compatible with acute cholecystitis. The obstructive calcified stone is likely in the infundibulum/proximal cystic duct, not within common bile duct. No calcified stones are visualized within the common bile duct. Findings discussed with Dr. Johney Maine of general surgery. Electronically Signed   By: Corrie Mckusick D.O.   On: 12/14/2018 19:17   Result Date: 12/14/2018 CLINICAL DATA:  59 year old female with a history of metastatic pancreatic cancer with right upper quadrant pain and fever EXAM: CT CHEST, ABDOMEN, AND PELVIS WITH CONTRAST TECHNIQUE: Multidetector CT imaging of the chest, abdomen and pelvis was performed following the standard protocol during bolus administration of intravenous contrast. CONTRAST:  164mL OMNIPAQUE IOHEXOL 300 MG/ML  SOLN COMPARISON:  Multiple prior PET CT most recent 09/01/2018, most recent CT abdomen 10/14/2017 FINDINGS: CT CHEST FINDINGS Cardiovascular: Heart size within normal limits. No pericardial fluid/thickening. Unremarkable course caliber contour of the thoracic aorta. Unremarkable diameter of the main pulmonary artery. No proximal filling defects. No significant atherosclerosis. Port catheter on the right chest wall with IJ approach. Mediastinum/Nodes: No adenopathy. Unremarkable appearance of the thoracic esophagus. Unremarkable thoracic inlet. Lungs/Pleura: No pneumothorax or pleural effusion. No confluent airspace disease. Atelectatic changes/scarring bilateral lungs, predominantly in the dependent lung bases, lingula, right middle lobe. Musculoskeletal: No acute displaced fracture. No significant degenerative changes. No bony canal narrowing. CT ABDOMEN PELVIS FINDINGS Hepatobiliary: There are treatment changes within the liver, with multiple regions of necrotic/cystic change, new from the baseline CT of 07/23/2017. Compared to  the most recent PET/CT they are has been significant interval decrease in size of all of the lesions previously noted, involving all liver segments. Index lesion in segment 4 B measures 5.8 cm with internal necrosis. Index lesion within segment 5/6 measuring 5.7 cm, with internal necrosis. Index lesion segment 8 measures 3.2 cm with internal necrosis. Hypervascular region on the margin of a treated lesion within segment 5 (image 122 of series 3) likely perfusion anomaly/Thad. Distention of the gallbladder with gallbladder wall thickening and multiple intrahepatic stones. Calcified stone in the hilum of the liver, likely within the cystic duct. No calcified stone is identified within the distal common bile duct. Pancreas: Unchanged appearance of the pancreas with mild dilation of the pancreatic duct in the body of the pancreas. No inflammatory changes or calcified parenchyma. Spleen: Unremarkable spleen Adrenals/Urinary Tract: Unremarkable appearance of the adrenal glands. No evidence of hydronephrosis of the right or left kidney. No nephrolithiasis. Unremarkable course of the bilateral ureters. Unremarkable appearance of the urinary bladder. Stomach/Bowel: Unremarkable stomach. Small bowel unremarkable without abnormal distention or focal inflammatory changes. Enteric  contrast reaches the terminal ileum. Moderate stool burden. No evidence of obstruction. Vascular/Lymphatic: No significant atherosclerotic changes. Edema within the fat at the liver hilum, surrounding the gallbladder within the right abdomen, with mild edema within the mesenteric fat of the mid abdomen. No adenopathy. Reproductive: Unremarkable uterus. Other: None Musculoskeletal: No acute displaced fracture. IMPRESSION: CT demonstrates acute cholecystitis, with choledocholithiasis of the cystic duct. These results were discussed by telephone at the time of interpretation on 12/14/2018 at 5:05 pm with Dr. Burr Medico. Redemonstration of known liver metastases,  with positive response to therapy, interval reduction in size of all lesions, with multiple demonstrating significant internal necrosis. No acute finding of the chest. Ancillary findings as above. Electronically Signed: By: Corrie Mckusick D.O. On: 12/14/2018 17:12   Ir Perc Cholecystostomy  Result Date: 12/15/2018 INDICATION: 58 year old with metastatic pancreatic cancer and acute cholecystitis. Patient is not a surgical candidate. EXAM: PLACEMENT OF CHOLECYSTOSTOMY TUBE WITH ULTRASOUND AND FLUOROSCOPIC GUIDANCE MEDICATIONS: Inpatient receiving scheduled IV antibiotics. ANESTHESIA/SEDATION: Moderate (conscious) sedation was employed during this procedure. A total of Versed 2.0 mg and Fentanyl 100 mcg was administered intravenously. Moderate Sedation Time: 18 minutes minutes. The patient's level of consciousness and vital signs were monitored continuously by radiology nursing throughout the procedure under my direct supervision. FLUOROSCOPY TIME:  Fluoroscopy Time: 54 seconds, 3 mGy CONTRAST:  5 mL Omnipaque 932 COMPLICATIONS: None immediate. PROCEDURE: Informed written consent was obtained from the patient after a thorough discussion of the procedural risks, benefits and alternatives. All questions were addressed. Maximal Sterile Barrier Technique was utilized including caps, mask, sterile gowns, sterile gloves, sterile drape, hand hygiene and skin antiseptic. A timeout was performed prior to the initiation of the procedure. Right abdomen was prepped and draped in sterile fashion. Ultrasound was used to identify an abnormal gallbladder. Skin was anesthetized with 1% lidocaine. Small skin incision was made. 21 gauge needle was directed into the gallbladder with ultrasound guidance. 0.018 wire was advanced into the gallbladder and a transitional dilator set was placed. Stiff Amplatz wire was advanced into the gallbladder and the tract was dilated to accommodate a 10.2 [place] multipurpose drain. 80 mL of bloody  bilious thick fluid was removed from the gallbladder. Fluid sample sent for culture. Catheter was sutured to skin and attached to a gravity bag. FINDINGS: Gallbladder is markedly abnormal. Gallbladder is distended with wall thickening. Extensive echogenic material in the gallbladder compatible with stones and sludge. Small amount of pericholecystic fluid or ascites. 10.2 French drain was successfully advanced into the gallbladder. 80 mL of bloody bilious fluid was removed from the gallbladder. IMPRESSION: Successful image guided cholecystostomy tube placement. Electronically Signed   By: Markus Daft M.D.   On: 12/15/2018 15:00   Vas [place] Lower Extremity Venous (dvt)  Result Date: 12/15/2018  Lower Venous Study Indications: Edema. Left greater than right  Risk Factors: Cancer Pancreatic. Performing Technologist: Toma Copier RVS  Examination Guidelines: A complete evaluation includes B-mode imaging, spectral Doppler, color Doppler, and power Doppler as needed of all accessible portions of each vessel. Bilateral testing is considered an integral part of a complete examination. Limited examinations for reoccurring indications may be performed as noted.  +-----+---------------+---------+-----------+----------+-------+ RIGHTCompressibilityPhasicitySpontaneityPropertiesSummary +-----+---------------+---------+-----------+----------+-------+ CFV  Full           Yes      Yes                          +-----+---------------+---------+-----------+----------+-------+ SFJ  Full                                                 +-----+---------------+---------+-----------+----------+-------+   +---------+---------------+---------+-----------+----------+------------------+  LEFT     CompressibilityPhasicitySpontaneityPropertiesSummary            +---------+---------------+---------+-----------+----------+------------------+ CFV      Full           Yes      Yes                                      +---------+---------------+---------+-----------+----------+------------------+ SFJ      Full                                                            +---------+---------------+---------+-----------+----------+------------------+ FV Prox  Full           Yes      Yes                                     +---------+---------------+---------+-----------+----------+------------------+ FV Mid   Full                                                            +---------+---------------+---------+-----------+----------+------------------+ FV DistalFull           Yes      Yes                                     +---------+---------------+---------+-----------+----------+------------------+ PFV      Full           Yes      Yes                                     +---------+---------------+---------+-----------+----------+------------------+ POP      Full           Yes      Yes                                     +---------+---------------+---------+-----------+----------+------------------+ PTV      Partial                                      Acute              +---------+---------------+---------+-----------+----------+------------------+ PERO     Full                                         Difficult to image +---------+---------------+---------+-----------+----------+------------------+   Left Technical Findings: Acute DVT noted in one of the paired posterior tibial veins in the distal calf   Summary: Right: There is no evidence of a common femoral vein obstruction. Left: Findings consistent with acute deep vein thrombosis involving  the left posterior tibial vein. See technical findings listed above  *See table(s) above for measurements and observations. Electronically signed by Servando Snare MD on 12/15/2018 at 5:10:58 PM.    Final        Discharge Exam: Vitals:   12/19/18 0517 12/19/18 0833  BP: 99/65   Pulse: 71   Resp: 18   Temp: 98.2 F (36.8 C)    SpO2: 98% 97%    General: Pt is alert, awake, not in acute distress Cardiovascular: RRR, S1/S2 +, no rubs, no gallops Respiratory: CTA bilaterally, no wheezing, no rhonchi Abdominal: Soft, NT, ND, bowel sounds +, +chole drain with sanguinous output  Extremities: no edema, no cyanosis    The results of significant diagnostics from this hospitalization (including imaging, microbiology, ancillary and laboratory) are listed below for reference.     Microbiology: Recent Results (from the past 240 hour(s))  Culture, Blood     Status: None   Collection Time: 12/14/18  1:27 PM  Result Value Ref Range Status   Specimen Description   Final    BLOOD LEFT ARM Performed at [facility], [address], [place], [place] [address]    Special Requests   Final    BOTTLES DRAWN AEROBIC AND ANAEROBIC Blood Culture results may not be optimal due to an excessive volume of blood received in culture bottles   Culture   Final    NO GROWTH 5 DAYS Performed at [facility], [place] [address], [place], [place] [address]    Report Status 12/19/2018 FINAL  Final  Culture, Blood     Status: None   Collection Time: 12/14/18  1:50 PM  Result Value Ref Range Status   Specimen Description BLOOD PORTA CATH  Final   Special Requests   Final    BOTTLES DRAWN AEROBIC AND ANAEROBIC Blood Culture adequate volume   Culture   Final    NO GROWTH 5 DAYS Performed at [facility], [place] [address], [place], [place] [address]    Report Status 12/19/2018 FINAL  Final  Urine Culture     Status: Abnormal   Collection Time: 12/14/18  2:38 PM  Result Value Ref Range Status   Specimen Description   Final    URINE, CLEAN CATCH Performed at [facility], [place] [address] [place], [place] [address]    Special Requests   Final    NONE Performed at [facility], [place] [address] [place], [place] [address]    Culture (A)  Final    <10,000  COLONIES/mL INSIGNIFICANT GROWTH Performed at [facility] [address], [place], [place] [address]    Report Status 12/15/2018 FINAL  Final  Aerobic/Anaerobic Culture (surgical/deep wound)     Status: None (Preliminary result)   Collection Time: 12/15/18  2:57 PM  Result Value Ref Range Status   Specimen Description   Final    BILE Performed at [facility] [address] [place], [place] [address]    Special Requests   Final    NONE Performed at [facility], [place] [address] [place], [place] [address]    Gram Stain   Final    ABUNDANT WBC PRESENT, PREDOMINANTLY PMN NO ORGANISMS SEEN    Culture   Final    NO GROWTH 3 DAYS NO ANAEROBES ISOLATED; CULTURE IN PROGRESS FOR 5 DAYS Performed at [facility] [address] [place], [place]  [id number]    Report Status PENDING  Incomplete     Labs: BNP (last 3 results) No results for input(s): BNP in the last 8760 hours. Basic Metabolic Panel: Recent Labs  Lab 12/15/18 0841 12/16/18 0826 12/17/18 0350 12/18/18 0410 12/19/18 0431  NA 135  135 137 138 137 137  K 3.2*  3.3* 3.4* 3.5 3.4* 3.7  CL 100  100 103 102 102 103  CO2 28  28 26 26 27 26   GLUCOSE 98  97 100* 96 98 96  BUN 22*  22* 17 19 17 18   CREATININE 0.96  0.99 0.99 1.15* 1.06* 1.03*  CALCIUM 8.6*  8.6* 8.5* 8.9 8.9 9.0  MG 1.7  --  1.6*  --   --   PHOS 3.6  --   --   --   --    Liver Function Tests: Recent Labs  Lab 12/12/18 1056 12/14/18 1317 12/15/18 0841 12/16/18 0826 12/17/18 0350  AST 48* 55* 38  38 37 38  ALT 53* 57* 45*  45* 42 38  ALKPHOS 317* 354* 221*  227* 255* 296*  BILITOT 0.5 1.5* 1.9*  1.8* 1.1 1.1  PROT 7.7 7.7 6.5  6.6 6.2* 6.4*  ALBUMIN 3.4* 3.4* 3.0*  3.0* 2.9* 2.9*   Recent Labs  Lab 12/14/18 1332  LIPASE 65*  AMYLASE 53   No results for input(s): AMMONIA in the last 168 hours. CBC: Recent Labs  Lab 12/12/18 1056 12/14/18 1317 12/15/18 0841 12/16/18 0826  12/16/18 2023 12/17/18 0350 12/18/18 0410 12/19/18 0431  WBC 2.0* 7.4 5.9  5.9 4.0  --  2.6* 2.1* 4.8  NEUTROABS 1.1* 7.1  --   --   --   --   --   --   HGB 8.8* 8.9* 7.1*  7.2* 6.5* 8.1* 7.4* 7.6* 7.4*  HCT 26.8* 26.3* 21.4*  21.3* 20.1* 23.8* 22.2* 22.6* 22.4*  MCV 106.8* 102.7* 104.4*  104.4* 105.8*  --  100.9* 102.3* 104.7*  PLT 120* 132* 93*  90* 91*  --  75* 67* 62*   Cardiac Enzymes: No results for input(s): CKTOTAL, CKMB, CKMBINDEX, TROPONINI in the last 168 hours. BNP: Invalid input(s): POCBNP CBG: Recent Labs  Lab 12/15/18 2058  GLUCAP 94   D-Dimer No results for input(s): DDIMER in the last 72 hours. Hgb A1c No results for input(s): HGBA1C in the last 72 hours. Lipid Profile No results for input(s): CHOL, HDL, LDLCALC, TRIG, CHOLHDL, LDLDIRECT in the last 72 hours. Thyroid function studies No results for input(s): TSH, T4TOTAL, T3FREE, THYROIDAB in the last 72 hours.  Invalid input(s): FREET3 Anemia work up No results for input(s): VITAMINB12, FOLATE, FERRITIN, TIBC, IRON, RETICCTPCT in the last 72 hours. Urinalysis    Component Value Date/Time   COLORURINE AMBER (A) 12/14/2018 1438   APPEARANCEUR HAZY (A) 12/14/2018 1438   LABSPEC 1.024 12/14/2018 1438   PHURINE 5.0 12/14/2018 1438   GLUCOSEU NEGATIVE 12/14/2018 1438   HGBUR SMALL (A) 12/14/2018 1438   BILIRUBINUR NEGATIVE 12/14/2018 1438   KETONESUR NEGATIVE 12/14/2018 1438   PROTEINUR 30 (A) 12/14/2018 1438   NITRITE NEGATIVE 12/14/2018 1438   LEUKOCYTESUR NEGATIVE 12/14/2018 1438   Sepsis Labs Invalid input(s): PROCALCITONIN,  WBC,  LACTICIDVEN Microbiology Recent Results (from the past 240 hour(s))  Culture, Blood     Status: None   Collection Time: 12/14/18  1:27 PM  Result Value Ref Range Status   Specimen Description   Final    BLOOD LEFT ARM Performed at [facility], [place] [address], [place], [place] [address]    Special Requests   Final    BOTTLES DRAWN  AEROBIC AND ANAEROBIC Blood Culture results may not be optimal due to an excessive volume of blood received in culture bottles   Culture   Final    NO GROWTH 5 DAYS Performed at [facility], [place] [address], [place], [place] [address]    Report Status 12/19/2018 FINAL  Final  Culture, Blood     Status: None   Collection Time: 12/14/18  1:50 PM  Result Value Ref Range Status   Specimen Description BLOOD PORTA CATH  Final   Special Requests   Final    BOTTLES DRAWN AEROBIC AND ANAEROBIC Blood Culture adequate volume   Culture   Final    NO GROWTH 5 DAYS Performed at [facility], [place] [address], [place], [place] [address]    Report Status 12/19/2018 FINAL  Final  Urine Culture     Status: Abnormal   Collection Time: 12/14/18  2:38 PM  Result Value Ref Range Status   Specimen Description   Final    URINE, CLEAN CATCH Performed at [facility], [place] [address], [place], [place] [address]    Special Requests   Final    NONE Performed at [facility], [place] [address], [place], [place] [address]    Culture (A)  Final    <10,000 COLONIES/mL INSIGNIFICANT GROWTH Performed at [place] [address] [place], [place] [address]    Report Status 12/15/2018 FINAL  Final  Aerobic/Anaerobic Culture (surgical/deep wound)     Status: None (Preliminary result)   Collection Time: 12/15/18  2:57 PM  Result Value Ref Range Status   Specimen Description   Final    BILE Performed at [place] [address] [place], [place] [address]    Special Requests   Final    NONE Performed at [facility], [place] [address], [place], [place] [address]    Gram Stain   Final    ABUNDANT WBC PRESENT, PREDOMINANTLY PMN NO ORGANISMS SEEN    Culture   Final    NO GROWTH 3 DAYS NO ANAEROBES ISOLATED; CULTURE IN PROGRESS FOR 5 DAYS Performed at [place] [address] [place],  [place] [address]    Report Status PENDING  Incomplete     Patient was seen and examined on the day of discharge and was found to be in stable condition. Time coordinating discharge: 25 minutes including assessment and coordination of care, as well as examination of the patient.   SIGNED:  Dessa Phi, DO [facility] [email or web address] 12/19/2018, 9:18 AM

## 2018-12-19 NOTE — Progress Notes (Signed)
Morgan Dawson   DOB:August 17, 1961   OM#:[id number]   JGG#:[id number]   Oncology follow up   Subjective: Pt is stable over the weekend.  She still has bloody biliary drainage, and has gone bleeding when she was on heparin drip.  Heparin was stopped over the weekend due to the concern of thrombocytopenia because of bleeding.  No significant lower extremity pain or edema.  She is eager to go home.   Objective:  Vitals:   12/18/18 2025 12/19/18 0517  BP: 105/69 99/65  Pulse: 80 71  Resp: 19 18  Temp: 98.4 F (36.9 C) 98.2 F (36.8 C)  SpO2: 99% 98%    Body mass index is 17.32 kg/m.  Intake/Output Summary (Last 24 hours) at 12/19/2018 0824 Last data filed at 12/19/2018 0500 Gross per 24 hour  Intake 330 ml  Output 200 ml  Net 130 ml     Sclerae unicteric  Oropharynx clear  No peripheral adenopathy  Lungs clear -- no rales or rhonchi  Heart regular rate and rhythm  Abdomen benign, biliary drainage tube on the right side with bloody fluid in bag   MSK no focal spinal tenderness, no peripheral edema  Neuro nonfocal   CBG (last 3)  No results for input(s): GLUCAP in the last 72 hours.   Labs:  Urine Studies No results for input(s): UHGB, CRYS in the last 72 hours.  Invalid input(s): UACOL, UAPR, USPG, UPH, UTP, UGL, UKET, UBIL, UNIT, UROB, [place], UEPI, UWBC, Duwayne Heck [place], [place]  Basic Metabolic Panel: Recent Labs  Lab 12/15/18 0841 12/16/18 0826 12/17/18 0350 12/18/18 0410 12/19/18 0431  NA 135  135 137 138 137 137  K 3.2*  3.3* 3.4* 3.5 3.4* 3.7  CL 100  100 103 102 102 103  CO2 28  28 26 26 27 26   GLUCOSE 98  97 100* 96 98 96  BUN 22*  22* 17 19 17 18   CREATININE 0.96  0.99 0.99 1.15* 1.06* 1.03*  CALCIUM 8.6*  8.6* 8.5* 8.9 8.9 9.0  MG 1.7  --  1.6*  --   --   PHOS 3.6  --   --   --   --    GFR Estimated Creatinine Clearance: 45.4 mL/min (A) (by C-G formula based on SCr of 1.03 mg/dL (H)). Liver Function Tests: Recent Labs  Lab  12/12/18 1056 12/14/18 1317 12/15/18 0841 12/16/18 0826 12/17/18 0350  AST 48* 55* 38  38 37 38  ALT 53* 57* 45*  45* 42 38  ALKPHOS 317* 354* 221*  227* 255* 296*  BILITOT 0.5 1.5* 1.9*  1.8* 1.1 1.1  PROT 7.7 7.7 6.5  6.6 6.2* 6.4*  ALBUMIN 3.4* 3.4* 3.0*  3.0* 2.9* 2.9*   Recent Labs  Lab 12/14/18 1332  LIPASE 65*  AMYLASE 53   No results for input(s): AMMONIA in the last 168 hours. Coagulation profile Recent Labs  Lab 12/15/18 0941  INR 1.0    CBC Latest Ref Rng & Units 12/19/2018 12/18/2018 12/17/2018  WBC 4.0 - 10.5 K/uL 4.8 2.1(L) 2.6(L)  Hemoglobin 12.0 - 15.0 g/dL 7.4(L) 7.6(L) 7.4(L)  Hematocrit 36.0 - 46.0 % 22.4(L) 22.6(L) 22.2(L)  Platelets 150 - 400 K/uL 62(L) 67(L) 75(L)   Cardiac Enzymes: No results for input(s): CKTOTAL, CKMB, CKMBINDEX, TROPONINI in the last 168 hours. BNP: Invalid input(s): POCBNP CBG: Recent Labs  Lab 12/15/18 2058  GLUCAP 94   D-Dimer No results for input(s): DDIMER in the last 72 hours. Hgb A1c  No results for input(s): HGBA1C in the last 72 hours. Lipid Profile No results for input(s): CHOL, HDL, LDLCALC, TRIG, CHOLHDL, LDLDIRECT in the last 72 hours. Thyroid function studies No results for input(s): TSH, T4TOTAL, T3FREE, THYROIDAB in the last 72 hours.  Invalid input(s): FREET3 Anemia work up No results for input(s): VITAMINB12, FOLATE, FERRITIN, TIBC, IRON, RETICCTPCT in the last 72 hours. Microbiology Recent Results (from the past 240 hour(s))  Culture, Blood     Status: None (Preliminary result)   Collection Time: 12/14/18  1:27 PM  Result Value Ref Range Status   Specimen Description   Final    BLOOD LEFT ARM Performed at [facility], [address], [place], [place] [address]    Special Requests   Final    BOTTLES DRAWN AEROBIC AND ANAEROBIC Blood Culture results may not be optimal due to an excessive volume of blood received in culture bottles   Culture   Final    NO GROWTH 4  DAYS Performed at [facility], [place] [address] [place], [place] [address]    Report Status PENDING  Incomplete  Culture, Blood     Status: None (Preliminary result)   Collection Time: 12/14/18  1:50 PM  Result Value Ref Range Status   Specimen Description BLOOD PORTA CATH  Final   Special Requests   Final    BOTTLES DRAWN AEROBIC AND ANAEROBIC Blood Culture adequate volume   Culture   Final    NO GROWTH 4 DAYS Performed at [facility], [place] [address], [place], [place] [address]    Report Status PENDING  Incomplete  Urine Culture     Status: Abnormal   Collection Time: 12/14/18  2:38 PM  Result Value Ref Range Status   Specimen Description   Final    URINE, CLEAN CATCH Performed at [facility], [place] [address], [place], [place] [address]    Special Requests   Final    NONE Performed at [facility], [place] [address] [place], [place] [address]    Culture (A)  Final    <10,000 COLONIES/mL INSIGNIFICANT GROWTH Performed at [place] [address], [place], [place] [address]    Report Status 12/15/2018 FINAL  Final  Aerobic/Anaerobic Culture (surgical/deep wound)     Status: None (Preliminary result)   Collection Time: 12/15/18  2:57 PM  Result Value Ref Range Status   Specimen Description   Final    BILE Performed at [place] [address], [place], [place] [address]    Special Requests   Final    NONE Performed at [facility], [place] [address] [place], [place] [address]    Gram Stain   Final    ABUNDANT WBC PRESENT, PREDOMINANTLY PMN NO ORGANISMS SEEN    Culture   Final    NO GROWTH 3 DAYS NO ANAEROBES ISOLATED; CULTURE IN PROGRESS FOR 5 DAYS Performed at [place] [address] [place], [place] [address]    Report Status PENDING  Incomplete      Studies:  No results found.  Assessment: 58 y.o.  Female   1.  Acute cholecystitis,  status post cholecystectomy tube placement 4/23 2. Acute LE DVT 3.  Metastatic pancreatic cancer on second line chemotherapy with good response 4. Anemia secondary to chemotherapy, malignancy, and bleeding, worsening  5.  Moderate thrombocytopenia, secondary to chemotherapy 6.  Transaminitis secondary to chemo  antibody test 7.  Moderate protein and calorie malnutrition   Plan:  -lab reviewed, plt 62K this morning, her glucose of bleeding has stopped since heparin was held. -We discussed the high risk of recurrent DVT due to her underline pancreatic cancer and chemotherapy, and risk of bleeding from anticoagulation due to her thrombocytopenia.  Her last cycle chemo was 1 week ago, and I anticipate her platelet count will recover this week.  I recommend her go home with Xarelto 15 mg daily (50% loading dose) and a compromised anticoagulation regimen, to reduce her risk of bleeding. I will check her CBC in 2-3 days and hold Xarelto if platelet drops below 50K or if she has worsening signs of bleeding. She is agreeable with the plan -We discussed postponing her chemo for a week, I will see her back next week in my clinic. -antibiotics per primary team. I appreciate the help. I spoke with 'dr. Maylene Roes yesterday.  -she will also f/u with IR and surgery after discharge    [name], MD 12/19/2018  8:24 AM

## 2018-12-19 NOTE — TOC Transition Note (Addendum)
Transition of Care [facility]) - CM/SW Discharge Note   Patient Details  Name: Morgan Dawson MRN: [id number] Date of Birth: 1961-03-24  [facility] CM/SW Contact:  Leeroy Cha, RN Phone Number: 12/19/2018, 9:41 AM   Clinical Narrative:    Discharged to home with self-care, orders checked for hhc needs. No TOC needs present at time of discharge.  Patient is able to arrangement own appointments and home care.   Final next level of care: Home/Self Care Barriers to Discharge: No Barriers Identified   Patient Goals and CMS Choice Patient states their goals for this hospitalization and ongoing recovery are:: i have none other to go home CMS Medicare.gov Compare Post Acute Care list provided to:: Patient    Discharge Placement                       Discharge Plan and Services   Discharge Planning Services: CM Consult                                 Social Determinants of Health (SDOH) Interventions     Readmission Risk Interventions No flowsheet data found.

## 2018-12-19 NOTE — Progress Notes (Signed)
Morgan Dawson [id number] 07-08-1961  CARE TEAM:  PCP: [name], MD  Outpatient Care Team: Patient Care Team: [name], MD as PCP - General (Family Medicine) [name], MD as Consulting Physician (Otolaryngology) [name], MD as Consulting Physician (Oncology)  Inpatient Treatment Team: Treatment Team: Attending Provider: Dessa Phi, DO; Consulting Physician: [name], MD; Consulting Physician: Edison Pace, Md, MD; Rounding Team: Joycelyn Das, MD; Registered Nurse: Dwain Sarna Loralee Pacas, RN; Rounding Team: Dorthy Cooler Radiology, MD; Technician: Henri Medal, NT; Registered Nurse: Consuela Mimes, RN; Registered Nurse: Thermon Leyland, RN; Utilization Review: Micah Noel, RN; Case Manager: Frann Rider, RN   Problem List:   Principal Problem:   Acute calculous cholecystitis Active Problems:   Pancreatic cancer [facility])   Pancreatic cancer metastasized to liver [facility])   Liver metastases - presumed pancreatic cancer primary   On antineoplastic chemotherapy   Port-A-Cath in place   CKD (chronic kidney disease), stage III ([place])   Anemia due to antineoplastic chemotherapy   Hypokalemia   Thrombocytopenia ([place])   Vertigo   Meniere's disease of right ear   Cholelithiasis - calcified gallstones   Inguinal hernia, left   Hemangioma of liver, right lobe segment 5   Leg edema, left      * No surgery found *  12/15/2018.  Percutaneous cholecystostomy by interventional radiology    Assessment  Improving  [facility] Stay = 5 days)  Plan:  Assessment & Plan: Acute calculous cholecystitis             Perc cholecystostomy tube placed 3days ago             Monitor output             Cultures NTD             Tolerating diet With her malnourished immunosuppressed for her metastatic cancer and recent chemotherapy would rec total of 2 weeks of abx for cholecystitis (doesn't have to be IV)  Rediscussed GB mgmt with cholecystostomy tube.  Follow-up drain  study.  Ideally patient would get interval cholecystectomy in 6 weeks once inflammation has gone down.  With her metastatic disease and liver metastases, risks of surgery markedly increased as noted in prior notes.  This may be a situation where drain tube would be removed if outpatient drain study shows no cystic duct obstruction in the hopes to avoid surgery.  May benefit from follow-up with our surgical oncologist, Dr. Barry Dienes.  Stage IV pancreatic ca             Continue chemo Rx  Acute LLE DVT - primary team holding anticoag due to ongoing bleeding for now; VS stable; discussed with [facility] about considering IVC filter given pt's underlying malignancy. Will defer to [facility] and oncology about mgmt  Anemia - hgb 6.5-->1u-->8.1-->7.4-->7.6 -->7.4 this am; even though still having sang output from biliary drain hgb is stable  Left inguinal hernia.  Unchanged and reducible.  Hold off on any repair in the setting of metastatic pancreatic disease  30 minutes spent in review, evaluation, examination, counseling, and coordination of care.  More than 50% of that time was spent in counseling.  12/19/2018    Subjective: (Chief complaint)  Feeling better overall.  Less pain.  Tolerating solid food.  Hoping to go home.  Objective:  Vital signs:  Vitals:   12/18/18 1426 12/18/18 2025 12/19/18 0517 12/19/18 0833  BP: 93/62 105/69 99/65   Pulse: 74 80 71   Resp: 18 19  18   Temp: 98.3 F (36.8 C) 98.4 F (36.9 C) 98.2 F (36.8 C)   TempSrc: Oral Oral Oral   SpO2: 97% 99% 98% 97%  Weight:      Height:        Last BM Date: 12/16/18  Intake/Output   Yesterday:  04/26 0701 - 04/27 0700 In: 6578 [P.O.:1190] Out: 200 [Drains:200] This shift:  Total I/O In: 10 [I.V.:10] Out: -   Bowel function:  Flatus: YES  BM:  No  Drain: Sanguino-bilious   Physical Exam:  General: Pt awake/alert/oriented x4 in no acute distress.  Thin but not frankly cachectic.  Bright and alert. Eyes:  PERRL, normal EOM.  Sclera clear.  No icterus Neuro: CN II-XII intact w/o focal sensory/motor deficits. Lymph: No head/neck/groin lymphadenopathy Psych:  No delerium/psychosis/paranoia HENT: Normocephalic, Mucus membranes moist.  No thrush Neck: Supple, No tracheal deviation Chest: No chest wall pain w good excursion CV:  Pulses intact.  Regular rhythm MS: Normal AROM mjr joints.  No obvious deformity  Abdomen: Soft.  Nondistended.  Tenderness at drain site only.  No evidence of peritonitis.  No incarcerated hernias.  GU: No major lymphadenopathy.  Reducible left inguinal hernia.  Unchanged.  Ext:   No deformity.  No mjr edema.  No cyanosis Skin: No petechiae / purpura  Results:   Labs: Results for orders placed or performed during the hospital encounter of 12/14/18 (from the past 48 hour(s))  CBC     Status: Abnormal   Collection Time: 12/18/18  4:10 AM  Result Value Ref Range   WBC 2.1 (L) 4.0 - 10.5 K/uL   RBC 2.21 (L) 3.87 - 5.11 MIL/uL   Hemoglobin 7.6 (L) 12.0 - 15.0 g/dL   HCT 22.6 (L) 36.0 - 46.0 %   MCV 102.3 (H) 80.0 - 100.0 fL   MCH 34.4 (H) 26.0 - 34.0 pg   MCHC 33.6 30.0 - 36.0 g/dL   RDW 16.0 (H) 11.5 - 15.5 %   Platelets 67 (L) 150 - 400 K/uL    Comment: REPEATED TO VERIFY Immature Platelet Fraction may be clinically indicated, consider ordering this additional test [id number] CONSISTENT WITH PREVIOUS RESULT    nRBC 0.0 0.0 - 0.2 %    Comment: Performed at [facility], [place] [address], [place], [place] [address]  Basic metabolic panel     Status: Abnormal   Collection Time: 12/18/18  4:10 AM  Result Value Ref Range   Sodium 137 135 - 145 mmol/L   Potassium 3.4 (L) 3.5 - 5.1 mmol/L   Chloride 102 98 - 111 mmol/L   CO2 27 22 - 32 mmol/L   Glucose, Bld 98 70 - 99 mg/dL   BUN 17 6 - 20 mg/dL   Creatinine, Ser 1.06 (H) 0.44 - 1.00 mg/dL   Calcium 8.9 8.9 - 10.3 mg/dL   GFR calc non Af Amer 59 (L) >60 mL/min   GFR calc Af Amer >60 >60  mL/min   Anion gap 8 5 - 15    Comment: Performed at [facility], [place] [address], [place], [place] [address]  CBC     Status: Abnormal   Collection Time: 12/19/18  4:31 AM  Result Value Ref Range   WBC 4.8 4.0 - 10.5 K/uL   RBC 2.14 (L) 3.87 - 5.11 MIL/uL   Hemoglobin 7.4 (L) 12.0 - 15.0 g/dL   HCT 22.4 (L) 36.0 - 46.0 %   MCV 104.7 (H) 80.0 - 100.0  fL   MCH 34.6 (H) 26.0 - 34.0 pg   MCHC 33.0 30.0 - 36.0 g/dL   RDW 15.7 (H) 11.5 - 15.5 %   Platelets 62 (L) 150 - 400 K/uL    Comment: Immature Platelet Fraction may be clinically indicated, consider ordering this additional test [id number] CONSISTENT WITH PREVIOUS RESULT    nRBC 0.4 (H) 0.0 - 0.2 %    Comment: Performed at [facility], [place] [address], [place], [place] [address]  Basic metabolic panel     Status: Abnormal   Collection Time: 12/19/18  4:31 AM  Result Value Ref Range   Sodium 137 135 - 145 mmol/L   Potassium 3.7 3.5 - 5.1 mmol/L   Chloride 103 98 - 111 mmol/L   CO2 26 22 - 32 mmol/L   Glucose, Bld 96 70 - 99 mg/dL   BUN 18 6 - 20 mg/dL   Creatinine, Ser 1.03 (H) 0.44 - 1.00 mg/dL   Calcium 9.0 8.9 - 10.3 mg/dL   GFR calc non Af Amer >60 >60 mL/min   GFR calc Af Amer >60 >60 mL/min   Anion gap 8 5 - 15    Comment: Performed at [facility], [place] [address], [place], [place] [address]    Imaging / Studies: No results found.  Medications / Allergies: per chart  Antibiotics: Anti-infectives (From admission, onward)   Start     Dose/Rate Route Frequency Ordered Stop   12/19/18 0000  amoxicillin-clavulanate (AUGMENTIN) 875-125 MG tablet     1 tablet Oral Every 12 hours 12/19/18 0916 12/28/18 2359   12/17/18 1000  amoxicillin-clavulanate (AUGMENTIN) 875-125 MG per tablet 1 tablet     1 tablet Oral Every 12 hours 12/17/18 0825     12/15/18 0200  piperacillin-tazobactam (ZOSYN) IVPB 3.375 g  Status:  Discontinued     3.375 g 12.5 mL/hr over 240 Minutes  Intravenous Every 8 hours 12/14/18 1910 12/17/18 0825   12/14/18 1900  piperacillin-tazobactam (ZOSYN) IVPB 3.375 g     3.375 g 100 mL/hr over 30 Minutes Intravenous  Once 12/14/18 1845 12/14/18 2028        Note: Portions of this report may have been transcribed using voice recognition software. Every effort was made to ensure accuracy; however, inadvertent computerized transcription errors may be present.   Any transcriptional errors that result from this process are unintentional.     Adin Hector, MD, FACS, MASCRS Gastrointestinal and Minimally Invasive Surgery    [address] [place], [place] [address] [phone] Main / Paging [phone] Fax

## 2018-12-20 LAB — AEROBIC/ANAEROBIC CULTURE W GRAM STAIN (SURGICAL/DEEP WOUND): Culture: NO GROWTH

## 2018-12-21 ENCOUNTER — Encounter: Payer: Self-pay | Admitting: General Practice

## 2018-12-21 NOTE — Progress Notes (Signed)
[facility] Spiritual Care Note  LVM of support and encouragement to f/u after recent inpt discharge. Encouraged callback if it would be helpful to connect.   [place], [place], [facility] Pager [phone] Voicemail [phone]

## 2018-12-22 ENCOUNTER — Other Ambulatory Visit: Payer: Self-pay

## 2018-12-22 ENCOUNTER — Inpatient Hospital Stay (HOSPITAL_BASED_OUTPATIENT_CLINIC_OR_DEPARTMENT_OTHER): Payer: 59 | Admitting: Nurse Practitioner

## 2018-12-22 ENCOUNTER — Inpatient Hospital Stay: Payer: 59

## 2018-12-22 ENCOUNTER — Encounter: Payer: Self-pay | Admitting: Nurse Practitioner

## 2018-12-22 VITALS — BP 105/66 | HR 84 | Temp 98.0°F | Resp 18 | Ht 65.0 in | Wt 103.7 lb

## 2018-12-22 DIAGNOSIS — K819 Cholecystitis, unspecified: Secondary | ICD-10-CM

## 2018-12-22 DIAGNOSIS — C787 Secondary malignant neoplasm of liver and intrahepatic bile duct: Secondary | ICD-10-CM

## 2018-12-22 DIAGNOSIS — G620 Drug-induced polyneuropathy: Secondary | ICD-10-CM

## 2018-12-22 DIAGNOSIS — T451X5D Adverse effect of antineoplastic and immunosuppressive drugs, subsequent encounter: Secondary | ICD-10-CM | POA: Diagnosis not present

## 2018-12-22 DIAGNOSIS — N183 Chronic kidney disease, stage 3 (moderate): Secondary | ICD-10-CM | POA: Diagnosis not present

## 2018-12-22 DIAGNOSIS — Z79899 Other long term (current) drug therapy: Secondary | ICD-10-CM | POA: Diagnosis not present

## 2018-12-22 DIAGNOSIS — C251 Malignant neoplasm of body of pancreas: Secondary | ICD-10-CM

## 2018-12-22 DIAGNOSIS — D649 Anemia, unspecified: Secondary | ICD-10-CM

## 2018-12-22 DIAGNOSIS — Z9221 Personal history of antineoplastic chemotherapy: Secondary | ICD-10-CM

## 2018-12-22 DIAGNOSIS — E876 Hypokalemia: Secondary | ICD-10-CM | POA: Diagnosis not present

## 2018-12-22 DIAGNOSIS — C259 Malignant neoplasm of pancreas, unspecified: Secondary | ICD-10-CM | POA: Diagnosis not present

## 2018-12-22 DIAGNOSIS — Z5111 Encounter for antineoplastic chemotherapy: Secondary | ICD-10-CM | POA: Diagnosis not present

## 2018-12-22 LAB — CBC WITH DIFFERENTIAL/PLATELET
Abs Immature Granulocytes: 0.65 10*3/uL — ABNORMAL HIGH (ref 0.00–0.07)
Basophils Absolute: 0 10*3/uL (ref 0.0–0.1)
Basophils Relative: 1 %
Eosinophils Absolute: 0.1 10*3/uL (ref 0.0–0.5)
Eosinophils Relative: 1 %
HCT: 23.9 % — ABNORMAL LOW (ref 36.0–46.0)
Hemoglobin: 7.7 g/dL — ABNORMAL LOW (ref 12.0–15.0)
Immature Granulocytes: 11 %
Lymphocytes Relative: 15 %
Lymphs Abs: 0.9 10*3/uL (ref 0.7–4.0)
MCH: 34.4 pg — ABNORMAL HIGH (ref 26.0–34.0)
MCHC: 32.2 g/dL (ref 30.0–36.0)
MCV: 106.7 fL — ABNORMAL HIGH (ref 80.0–100.0)
Monocytes Absolute: 0.7 10*3/uL (ref 0.1–1.0)
Monocytes Relative: 11 %
Neutro Abs: 3.7 10*3/uL (ref 1.7–7.7)
Neutrophils Relative %: 61 %
Platelets: 118 10*3/uL — ABNORMAL LOW (ref 150–400)
RBC: 2.24 MIL/uL — ABNORMAL LOW (ref 3.87–5.11)
RDW: 17.5 % — ABNORMAL HIGH (ref 11.5–15.5)
WBC: 6 10*3/uL (ref 4.0–10.5)
nRBC: 0 % (ref 0.0–0.2)

## 2018-12-22 LAB — COMPREHENSIVE METABOLIC PANEL
ALT: 42 U/L (ref 0–44)
AST: 44 U/L — ABNORMAL HIGH (ref 15–41)
Albumin: 3.3 g/dL — ABNORMAL LOW (ref 3.5–5.0)
Alkaline Phosphatase: 345 U/L — ABNORMAL HIGH (ref 38–126)
Anion gap: 10 (ref 5–15)
BUN: 22 mg/dL — ABNORMAL HIGH (ref 6–20)
CO2: 26 mmol/L (ref 22–32)
Calcium: 9.1 mg/dL (ref 8.9–10.3)
Chloride: 105 mmol/L (ref 98–111)
Creatinine, Ser: 1.13 mg/dL — ABNORMAL HIGH (ref 0.44–1.00)
GFR calc Af Amer: >60 mL/min (ref >60)
GFR calc non Af Amer: 54 mL/min — ABNORMAL LOW (ref >60)
Glucose, Bld: 112 mg/dL — ABNORMAL HIGH (ref 70–99)
Potassium: 3.6 mmol/L (ref 3.5–5.1)
Sodium: 141 mmol/L (ref 135–145)
Total Bilirubin: 0.4 mg/dL (ref 0.3–1.2)
Total Protein: 7 g/dL (ref 6.5–8.1)

## 2018-12-22 MED ORDER — TRAMADOL HCL 50 MG PO TABS: 50.0000 mg | ORAL_TABLET | Freq: Four times a day (QID) | ORAL | 0 refills | Status: DC | PRN

## 2018-12-22 NOTE — Progress Notes (Signed)
[place]   [phone] [phone]   Clinic Follow up Note   Patient Care Team: [name], MD as PCP - General (Family Medicine) [name], MD as Consulting Physician (Otolaryngology) [name], MD as Consulting Physician (Oncology) 12/22/2018  CHIEF COMPLAINT: f/u hospital discharge, pancreatic cancer  SUMMARY OF ONCOLOGIC HISTORY: Oncology History   Cancer Staging Pancreatic cancer [facility]) Staging form: Exocrine Pancreas, AJCC 8th Edition - Clinical stage from 08/06/2017: Stage IV (cTX, cN0, pM1) - Signed by [name], MD on 08/12/2017       Pancreatic cancer ([place])   07/23/2017 Imaging    US abdomen limited RUQ 07/23/17 IMPRESSION: 1. Cholelithiasis.  No secondary signs of acute cholecystitis. 2. Multiple solid liver masses measuring up to 6.5 cm in the left lobe of the liver, evaluation for metastatic disease is recommended. These results will be called to the ordering clinician or representative by the Radiologist Assistant, and communication documented in the PACS or zVision Dashboard.    07/23/2017 Imaging    CT Abdomen W Contrast 07/23/17 IMPRESSION: 1. Widespread metastatic disease throughout the liver. No clear primary malignancy identified in the abdomen. The pelvis was not imaged. Tissue sampling recommended. 2. Probable adenopathy superior to the pancreatic tail. No evidence of pancreatic mass. 3. Suspected incidental hemangioma inferiorly in the right hepatic lobe. 4. Nonspecific nodularity in the breasts. The patient has undergone recent (03/25/2017 and 04/01/2017) mammography and ultrasound.    07/29/2017 Initial Diagnosis    Metastasis to liver of unknown origin ([place])    07/29/2017 PET scan    PET 07/29/17  IMPRESSION: 1. Numerous bulky liver masses are hypermetabolic compatible with malignancy. 2. Accentuated activity within or along the pancreatic tail, likely represent a primary pancreatic tumor.  Consider pancreatic protocol MRI to further work this up. 3. The peripancreatic lymph node shown above the pancreatic tail is mildly hypermetabolic favoring malignancy. 4.  Prominent stool throughout the colon favors constipation. 5. 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    08/05/2017 Pathology Results    Liver Biopsy  Diagnosis 08/05/17 Liver, needle/core biopsy - CARCINOMA. - SEE COMMENT. Microscopic Comment The malignant cells are positive for cytokeratin 7. They are negative for arginase, CDX2, cytokeratin 20, estrogen receptor, GATA-3, GCDFP, Glypican 3, Hep Par 1, Napsin A, and TTF-1. This immunohistochemical is nonspecific. Possibly primary sources include pancreatobiliary and upper gastrointestinal. Radiologic correlation is necessary. Of note, organ specific markers (GATA-3, GCDFP-breast, TTF-1, Napsin A-lung, and CDX2-colon) are negative. (JBK:ecj 08/09/2017)    08/21/2017 - 02/10/2018 Chemotherapy    FOLFIRINOX every 2 weeks starting 08/21/17. Dose reduced due to neuropahty and cytopenia     10/14/2017 Imaging    CT CAP WO Contrast 10/14/17 IMPRESSION: Evidence of known numerous liver metastases without significant interval change. No other evidence of metastatic disease within the chest, abdomen or pelvis. Cholelithiasis. Tiny pericardial effusion.    12/02/2017 Genetic Testing    Negative for pathogenic mutation.  The genes analyzed were the 83 genes on [facility]'s Multi-Cancer panel (ALK, APC, ATM, AXIN2, BAP1, BARD1, BLM, BMPR1A, BRCA1, BRCA2, BRIP1, CASR, CDC73, CDH1, CDK4, CDKN1B, CDKN1C, CDKN2A, CEBPA, CHEK2, CTNNA1, DICER1, DIS3L2, EGFR, EPCAM, FH, FLCN, GATA2, GPC3, GREM1, HOXB13, HRAS, KIT, MAX, MEN1, MET, MITF, MLH1, MSH2, MSH3, MSH6, MUTYH, NBN, NF1, NF2, NTHL1, PALB2, PDGFRA, PHOX2B, PMS2, POLD1, POLE, POT1, PRKAR1A, PTCH1, PTEN, RAD50, RAD51C, RAD51D, RB1, RECQL4, RET, RUNX1, SDHA, SDHAF2, SDHB, SDHC, SDHD, SMAD4, SMARCA4, SMARCB1, SMARCE1, STK11, SUFU, TERC,  TERT, TMEM127, TP53, TSC1, TSC2, VHL, WRN, WT1).  12/17/2017 PET scan    IMPRESSION: 1. Although the liver metastases are still visible on the CT images, they are significantly smaller and retain no abnormal metabolic activity, consistent with response to therapy. 2. No abnormal activity within the pancreas. 3. No disease progression identified. 4. Cholelithiasis.    02/21/2018 PET scan    02/21/2018 PET Scan  IMPRESSION: 1. There are 2 new small nodules identified within the right lung which measure up to 5 mm. These are too small to reliably characterize by PET-CT, but warrant close interval follow-up. 2. Again noted are multifocal liver metastasis. The target lesion within the left lobe is slightly decreased in size when compared with the previous exam. Similar to previous exam there is no abnormal hypermetabolism above background liver activity identified within the liver lesions. 3. Gallstones.    02/22/2018 - 08/25/2018 Chemotherapy    5-FU pump infusion and liposomal irinotecan, every 2 weeks.  First cycle dose reduced due to cytopenia in the travel. Stopped on 08/25/2018 due to disease progression.     05/17/2018 Imaging    05/17/2018 PET Scan IMPRESSION: 1. Mixed response. The 2 small right lung nodules have decreased in size in the interval. Single focus of increased uptake within the liver is new from 02/21/2018 and concerning for recurrent metabolically active liver metastasis. 2. Splenomegaly.  New from previous exam. 3. Diffusely increased bone marrow activity, likely reflecting treatment related changes.    09/01/2018 PET scan    PET 09/01/18 IMPRESSION: 1. Unfortunately there recurrence of hepatic metastasis with multiple new hypermetabolic lesions in LEFT and RIGHT hepatic lobe. 2. New extra hepatic site of malignancy which appears associated the mid pancreas. Difficult to define lesion on noncontrast exam. 3. Diffuse marrow activity is favored benign. 4. No evidence  of pulmonary metastasis.    09/12/2018 -  Chemotherapy    second-line Gemcitabine and Abraxane for 2 weeks on, 1 week off starting 09/12/2018. Due to neuropathy, I will start her with dose reduced Abraxane.      Pancreatic cancer metastasized to liver ([place])   08/11/2017 Initial Diagnosis    Pancreatic cancer metastasized to liver ([place])    09/12/2018 -  Chemotherapy    second-line Gemcitabine and Abraxane for 2 weeks on, 1 week off starting 09/12/2018. Due to neuropathy, I will start her with dose reduced Abraxane.      CURRENT THERAPY:  Second-line Gemcitabine and Abraxane for 2 weeks on, 1 week off starting 09/12/2018 S/p cycle 5 day 1 12/05/18 and day 8 12/14/18   INTERVAL HISTORY: Ms. Kilner presents today for a hospital follow-up.  She was last seen in clinic by Sandi Mealy in [facility] 12/14/2018 for 2-day history of fever T-max 100.1 and acute abdominal pain.  She had fever up to 102.6 in clinic. She is status post cycle 5-day 8 gemcitabine/Abraxane on 12/12/2018.  She has frequent low-grade fevers following chemo but noted temp at home on 4/22 to be 100.1 and acute abdominal pain at the time.  Labs on 4/22 showed blood and urine cultures were negative, amylase and lipase were unremarkable.  Bili elevated to 1.5. CT AP showed acute cholecystitis with choledocholithiasis of the cystic duct.  Notably there has been significant interval decrease in size of all the lesions previously noted in the liver showing a positive response to therapy.  No acute findings in the chest.  He was admitted for inpatient management.  She was treated with IV Zosyn. Followed by general surgery, recommended IR percutaneous cholecystostomy which she underwent  per Dr. Anselm Pancoast on 12/15/2018. She developed acute DVT in the left posterior tibial vein and was started on IV heparin. She had bloody drainage from her tube secondary to thrombocytopenia and heparin which was eventually stopped. She was discharged on 12/19/18 on oral xarelto 15  mg daily (50% dose).   Today she presents to clinic alone. She continues augmentin po antibiotics, will complete in 6 days. No recurrent fever. Abdominal pain is well controlled on tramadol 1 po at night. Pain increases throughout the day and with muscle contractions, but generally stays less than 3/10. Drainage output ranges from 125-175 cc per day, dark bloody. She changes dressing every 1-2 days or when soiled. She continues xarelto 15 mg daily. She is able to be active, she feels well. Eating and drinking adequately. Denies fatigue. Denies cough, chest pain, leg swelling/pain, n/v/c/d, blood in stool, urine, gums, or nosebleeds.    REVIEW OF SYSTEMS:   Constitutional: Denies fevers, chills or abnormal weight loss Ears, nose, mouth, throat, and face: Denies mucositis or sore throat. Denies epistaxis or gingival bleeding  Respiratory: Denies cough, dyspnea or wheezes Cardiovascular: Denies palpitation, chest discomfort or lower extremity swelling Gastrointestinal:  Denies nausea, vomiting, constipation, diarrhea, rectal bleeding, heartburn or change in bowel habits (+)abdominal drain with bloody output  Skin: Denies abnormal skin rashes Lymphatics: Denies new lymphadenopathy or easy bruising Neurological:Denies new weaknesses (+) neuropathy stable, worse in feet Behavioral/Psych: Mood is stable, no new changes  All other systems were reviewed with the patient and are negative.  MEDICAL HISTORY:  Past Medical History:  Diagnosis Date  . Diarrhea of presumed infectious origin 01/12/2018  . Gastroenteritis 01/12/2018  . Genetic testing 12/02/2017   Multi-Cancer panel (83 genes) @ [facility] - No pathogenic mutations detected  . Meniere disease 2016  . Pancreatitis     SURGICAL HISTORY: Past Surgical History:  Procedure Laterality Date  . CERVICAL ABLATION    . ENDOMETRIAL ABLATION  2011  . IR FLUORO GUIDE PORT INSERTION RIGHT  08/12/2017  . IR PERC CHOLECYSTOSTOMY  12/15/2018  . IR US  GUIDE VASC ACCESS RIGHT  08/12/2017  . [place]    I have reviewed the social history and family history with the patient and they are unchanged from previous note.  ALLERGIES:  has No Known Allergies.  MEDICATIONS:  Current Outpatient Medications  Medication Sig Dispense Refill  . amoxicillin-clavulanate (AUGMENTIN) 875-125 MG tablet Take 1 tablet by mouth every 12 (twelve) hours for 9 days. 18 tablet 0  . b complex vitamins tablet Take 1 tablet by mouth daily.    Marland Kitchen gabapentin (NEURONTIN) 100 MG capsule Take 1 capsule (100 mg total) by mouth 3 (three) times daily. (Patient taking differently: Take 100 mg by mouth 2 (two) times daily. ) 90 capsule 2  . meclizine (ANTIVERT) 25 MG tablet Take 25 mg by mouth 3 (three) times daily as needed for dizziness.    . potassium chloride (K-DUR) 10 MEQ tablet Take 1 tablet (10 mEq total) by mouth 4 (four) times daily. 360 tablet 2  . prochlorperazine (COMPAZINE) 10 MG tablet Take 1 tablet (10 mg total) by mouth every 6 (six) hours as needed for nausea or vomiting. 40 tablet 2  . Rivaroxaban (XARELTO) 15 MG TABS tablet Take 1 tablet (15 mg total) by mouth daily with supper for 30 days. 30 tablet 0  . sodium chloride flush (NS) 0.9 % SOLN 5 mLs by Intracatheter route daily. 40 mL 3  . traMADol (ULTRAM) 50  MG tablet Take 1 tablet (50 mg total) by mouth every 6 (six) hours as needed for moderate pain or severe pain. 30 tablet 0  . loperamide (IMODIUM) 2 MG capsule Take 1 capsule (2 mg total) by mouth as needed for diarrhea or loose stools. 30 capsule 0  . loratadine (CLARITIN) 10 MG tablet Take 10 mg by mouth daily as needed for allergies.    . magic mouthwash w/lidocaine SOLN Take 10 mLs by mouth 3 (three) times daily as needed for mouth pain. Swish and spit 10 ML by mouth 3 times daily as needed for mouth pain. 240 mL 0  . ondansetron (ZOFRAN-ODT) 8 MG disintegrating tablet TAKE 1 TABLET BY MOUTH EVERY 8 HOURS AS NEEDED FOR NAUSE OR VOMITING  (Patient taking differently: Take 8 mg by mouth every 8 (eight) hours as needed for nausea or vomiting. ) 40 tablet 0  . oxyCODONE (OXY IR/ROXICODONE) 5 MG immediate release tablet Take 1 tablet (5 mg total) by mouth every 4 (four) hours as needed for up to 5 days for moderate pain or severe pain. (Patient not taking: Reported on 12/22/2018) 30 tablet 0   No current facility-administered medications for this visit.    Facility-Administered Medications Ordered in Other Visits  Medication Dose Route Frequency Provider Last Rate Last Dose  . sodium chloride flush (NS) 0.9 % injection 10 mL  10 mL Intracatheter Once [name], MD        PHYSICAL EXAMINATION: ECOG PERFORMANCE STATUS: 1 - Symptomatic but completely ambulatory  Vitals:   12/22/18 1057  BP: 105/66  Pulse: 84  Resp: 18  Temp: 98 F (36.7 C)  SpO2: 100%   Filed Weights   12/22/18 1057  Weight: 103 lb 11.2 oz (47 kg)    GENERAL:alert, no distress and comfortable SKIN: skin color normal, no rashes or significant lesions EYES: sclera clear LUNGS:  normal breathing effort HEART:  no lower extremity edema ABDOMEN:abdomen soft, non-tender. Right side percutaneous drain in place, dressing c/d/i Musculoskeletal:no cyanosis of digits and no clubbing  NEURO: alert & oriented x 3 with fluent speech, no focal motor/sensory deficits  LABORATORY DATA:  I have reviewed the data as listed CBC Latest Ref Rng & Units 12/22/2018 12/19/2018 12/18/2018  WBC 4.0 - 10.5 K/uL 6.0 4.8 2.1(L)  Hemoglobin 12.0 - 15.0 g/dL 7.7(L) 7.4(L) 7.6(L)  Hematocrit 36.0 - 46.0 % 23.9(L) 22.4(L) 22.6(L)  Platelets 150 - 400 K/uL 118(L) 62(L) 67(L)     CMP Latest Ref Rng & Units 12/22/2018 12/19/2018 12/18/2018  Glucose 70 - 99 mg/dL 112(H) 96 98  BUN 6 - 20 mg/dL 22(H) 18 17  Creatinine 0.44 - 1.00 mg/dL 1.13(H) 1.03(H) 1.06(H)  Sodium 135 - 145 mmol/L 141 137 137  Potassium 3.5 - 5.1 mmol/L 3.6 3.7 3.4(L)  Chloride 98 - 111 mmol/L 105 103 102  CO2 22  - 32 mmol/L 26 26 27   Calcium 8.9 - 10.3 mg/dL 9.1 9.0 8.9  Total Protein 6.5 - 8.1 g/dL 7.0 - -  Total Bilirubin 0.3 - 1.2 mg/dL 0.4 - -  Alkaline Phos 38 - 126 U/L 345(H) - -  AST 15 - 41 U/L 44(H) - -  ALT 0 - 44 U/L 42 - -      RADIOGRAPHIC STUDIES: I have personally reviewed the radiological images as listed and agreed with the findings in the report. No results found.   ASSESSMENT & PLAN: 58 y/o female   1.  Acute cholecystitis, status post cholecystectomy tube placement  4/23 per IR -followed by surgery and IR -drain intact, patent, and putting out 125-175 cc bloody drainage per day -afebrile, VSS -pain is well controlled, takes tramadol 1 tab at night; I refilled   2. Acute LE DVT -began on IV heparin while inpatient and transitioned to xarelto 15 mg daily (50% dose) due to bleeding -leg swelling resolved -encouraged her to be active, as she is on 50% loading dose and high risk for recurrence, she understands -continue xarelto 15 mg daily  - no other bleeding   3.  Metastatic pancreatic cancer on second line chemotherapy  -restaging CT CAP showed positive treatment response with interval decrease in size of all noted liver metastasis. Chest is negative. She is s/p cycle 5 gemcitabine/abraxane, 2 weeks on and 1 week off, last dose 12/12/18.  -she has frequent low grade fevers after chemo, otherwise she tolerates it well overall -chemo postponed to 01/02/19 to recover from cholecystitis and hospitalization  -f/u with Dr. Burr Medico next week 12/29/18. I have reviewed today's plan with her.   4. Cytopenia - secondary to malignancy, chemotherapy, and bleeding  -hgb is stable, 7.7 today; she is asymptomatic. No blood transfusion  -plt increased to 118K -continue anticoagulation and monitor for bleeding   PLAN:  -labs reviewed  -remain active -continue xarelto 15 mg daily  -refilled tramadol  -lab, f/u with Dr. Burr Medico next week  -plan to resume chemo 01/02/19   All questions were  answered. The patient knows to call the clinic with any problems, questions or concerns. No barriers to learning was detected.     Alla Feeling, NP 12/22/18

## 2018-12-23 ENCOUNTER — Other Ambulatory Visit: Payer: Self-pay | Admitting: Hematology

## 2018-12-23 ENCOUNTER — Telehealth: Payer: Self-pay | Admitting: Nurse Practitioner

## 2018-12-23 DIAGNOSIS — C251 Malignant neoplasm of body of pancreas: Secondary | ICD-10-CM

## 2018-12-23 NOTE — Telephone Encounter (Signed)
Scheduled appt per 4/30 los. ° ° °

## 2018-12-26 ENCOUNTER — Other Ambulatory Visit: Payer: 59

## 2018-12-26 ENCOUNTER — Ambulatory Visit: Payer: 59

## 2018-12-26 ENCOUNTER — Ambulatory Visit: Payer: 59 | Admitting: Hematology

## 2018-12-27 ENCOUNTER — Telehealth: Payer: Self-pay | Admitting: *Deleted

## 2018-12-27 ENCOUNTER — Other Ambulatory Visit: Payer: Self-pay | Admitting: *Deleted

## 2018-12-27 MED ORDER — NORMAL SALINE FLUSH 0.9 % IV SOLN: INTRAVENOUS | 1 refills | Status: DC

## 2018-12-27 NOTE — Progress Notes (Signed)
[place]   [phone]) [phone] [phone]) [phone]   Clinic Follow up Note   Patient Care Team: [name], MD as PCP - General (Family Medicine) [name], MD as Consulting Physician (Otolaryngology) [name], MD as Consulting Physician (Oncology)  Date of Service:  12/29/2018  CHIEF COMPLAINT: F/u of pancreatic cancer  SUMMARY OF ONCOLOGIC HISTORY: Oncology History   Cancer Staging Pancreatic cancer [facility]) Staging form: Exocrine Pancreas, AJCC 8th Edition - Clinical stage from 08/06/2017: Stage IV (cTX, cN0, pM1) - Signed by [name], MD on 08/12/2017       Pancreatic cancer ([place])   07/23/2017 Imaging    US abdomen limited RUQ 07/23/17 IMPRESSION: 1. Cholelithiasis.  No secondary signs of acute cholecystitis. 2. Multiple solid liver masses measuring up to 6.5 cm in the left lobe of the liver, evaluation for metastatic disease is recommended. These results will be called to the ordering clinician or representative by the Radiologist Assistant, and communication documented in the PACS or zVision Dashboard.    07/23/2017 Imaging    CT Abdomen W Contrast 07/23/17 IMPRESSION: 1. Widespread metastatic disease throughout the liver. No clear primary malignancy identified in the abdomen. The pelvis was not imaged. Tissue sampling recommended. 2. Probable adenopathy superior to the pancreatic tail. No evidence of pancreatic mass. 3. Suspected incidental hemangioma inferiorly in the right hepatic lobe. 4. Nonspecific nodularity in the breasts. The patient has undergone recent (03/25/2017 and 04/01/2017) mammography and ultrasound.    07/29/2017 Initial Diagnosis    Metastasis to liver of unknown origin ([place])    07/29/2017 PET scan    PET 07/29/17  IMPRESSION: 1. Numerous bulky liver masses are hypermetabolic compatible with malignancy. 2. Accentuated activity within or along the pancreatic tail, likely represent a primary pancreatic tumor.  Consider pancreatic protocol MRI to further work this up. 3. The peripancreatic lymph node shown above the pancreatic tail is mildly hypermetabolic favoring malignancy. 4.  Prominent stool throughout the colon favors constipation. 5. 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    08/05/2017 Pathology Results    Liver Biopsy  Diagnosis 08/05/17 Liver, needle/core biopsy - CARCINOMA. - SEE COMMENT. Microscopic Comment The malignant cells are positive for cytokeratin 7. They are negative for arginase, CDX2, cytokeratin 20, estrogen receptor, GATA-3, GCDFP, Glypican 3, Hep Par 1, Napsin A, and TTF-1. This immunohistochemical is nonspecific. Possibly primary sources include pancreatobiliary and upper gastrointestinal. Radiologic correlation is necessary. Of note, organ specific markers (GATA-3, GCDFP-breast, TTF-1, Napsin A-lung, and CDX2-colon) are negative. (JBK:ecj 08/09/2017)    08/21/2017 - 02/10/2018 Chemotherapy    FOLFIRINOX every 2 weeks starting 08/21/17. Dose reduced due to neuropahty and cytopenia     10/14/2017 Imaging    CT CAP WO Contrast 10/14/17 IMPRESSION: Evidence of known numerous liver metastases without significant interval change. No other evidence of metastatic disease within the chest, abdomen or pelvis. Cholelithiasis. Tiny pericardial effusion.    12/02/2017 Genetic Testing    Negative for pathogenic mutation.  The genes analyzed were the 83 genes on [facility]'s Multi-Cancer panel (ALK, APC, ATM, AXIN2, BAP1, BARD1, BLM, BMPR1A, BRCA1, BRCA2, BRIP1, CASR, CDC73, CDH1, CDK4, CDKN1B, CDKN1C, CDKN2A, CEBPA, CHEK2, CTNNA1, DICER1, DIS3L2, EGFR, EPCAM, FH, FLCN, GATA2, GPC3, GREM1, HOXB13, HRAS, KIT, MAX, MEN1, MET, MITF, MLH1, MSH2, MSH3, MSH6, MUTYH, NBN, NF1, NF2, NTHL1, PALB2, PDGFRA, PHOX2B, PMS2, POLD1, POLE, POT1, PRKAR1A, PTCH1, PTEN, RAD50, RAD51C, RAD51D, RB1, RECQL4, RET, RUNX1, SDHA, SDHAF2, SDHB, SDHC, SDHD, SMAD4, SMARCA4, SMARCB1, SMARCE1, STK11, SUFU, TERC,  TERT, TMEM127, TP53, TSC1,  TSC2, VHL, WRN, WT1).    12/17/2017 PET scan    IMPRESSION: 1. Although the liver metastases are still visible on the CT images, they are significantly smaller and retain no abnormal metabolic activity, consistent with response to therapy. 2. No abnormal activity within the pancreas. 3. No disease progression identified. 4. Cholelithiasis.    02/21/2018 PET scan    02/21/2018 PET Scan  IMPRESSION: 1. There are 2 new small nodules identified within the right lung which measure up to 5 mm. These are too small to reliably characterize by PET-CT, but warrant close interval follow-up. 2. Again noted are multifocal liver metastasis. The target lesion within the left lobe is slightly decreased in size when compared with the previous exam. Similar to previous exam there is no abnormal hypermetabolism above background liver activity identified within the liver lesions. 3. Gallstones.    02/22/2018 - 08/25/2018 Chemotherapy    5-FU pump infusion and liposomal irinotecan, every 2 weeks.  First cycle dose reduced due to cytopenia in the travel. Stopped on 08/25/2018 due to disease progression.     05/17/2018 Imaging    05/17/2018 PET Scan IMPRESSION: 1. Mixed response. The 2 small right lung nodules have decreased in size in the interval. Single focus of increased uptake within the liver is new from 02/21/2018 and concerning for recurrent metabolically active liver metastasis. 2. Splenomegaly.  New from previous exam. 3. Diffusely increased bone marrow activity, likely reflecting treatment related changes.    09/01/2018 PET scan    PET 09/01/18 IMPRESSION: 1. Unfortunately there recurrence of hepatic metastasis with multiple new hypermetabolic lesions in LEFT and RIGHT hepatic lobe. 2. New extra hepatic site of malignancy which appears associated the mid pancreas. Difficult to define lesion on noncontrast exam. 3. Diffuse marrow activity is favored benign. 4. No evidence  of pulmonary metastasis.    09/12/2018 -  Chemotherapy    second-line Gemcitabine and Abraxane for 2 weeks on, 1 week off starting 09/12/2018. Due to neuropathy, I will start her with dose reduced Abraxane.      Pancreatic cancer metastasized to liver ([place])   08/11/2017 Initial Diagnosis    Pancreatic cancer metastasized to liver ([place])    09/12/2018 -  Chemotherapy    second-line Gemcitabine and Abraxane for 2 weeks on, 1 week off starting 09/12/2018. Due to neuropathy, I will start her with dose reduced Abraxane.       CURRENT THERAPY:  Second-line Gemcitabine and Abraxane for 2 weeks on, 1 week off starting 09/12/2018. Cycle 5 postponed due to acute cholecystitis.   INTERVAL HISTORY:  Morgan Dawson is here for a follow up. She presents to the clinic today by herself. She notes she is eating well and gained weight. She notes her draining is 20m or less a day. Her output is still dark from blood. She notes right abdominal discomfort, no pain. She plans to f/u with surgeon. She notes she completed her antibiotics yesterday. She denies anymore fever or chills. She is ready to restart chemo.  She notes she drinks milk, orange juice, jug of tea, 2-3 glasses of more liquid.    REVIEW OF SYSTEMS:   Constitutional: Denies fevers, chills or abnormal weight loss Eyes: Denies blurriness of vision Ears, nose, mouth, throat, and face: Denies mucositis or sore throat Respiratory: Denies cough, dyspnea or wheezes Cardiovascular: Denies palpitation, chest discomfort or lower extremity swelling Gastrointestinal:  Denies nausea, heartburn or change in bowel habits (+) Right abdomen discomfort (+) Draining dark output.  Skin: Denies  abnormal skin rashes Lymphatics: Denies new lymphadenopathy or easy bruising Neurological:Denies numbness, tingling or new weaknesses Behavioral/Psych: Mood is stable, no new changes  All other systems were reviewed with the patient and are negative.  MEDICAL HISTORY:    Past Medical History:  Diagnosis Date   Diarrhea of presumed infectious origin 01/12/2018   Gastroenteritis 01/12/2018   Genetic testing 12/02/2017   Multi-Cancer panel (83 genes) @ [facility] - No pathogenic mutations detected   Meniere disease 2016   Pancreatitis     SURGICAL HISTORY: Past Surgical History:  Procedure Laterality Date   CERVICAL ABLATION     ENDOMETRIAL ABLATION  2011   IR FLUORO GUIDE PORT INSERTION RIGHT  08/12/2017   IR PERC CHOLECYSTOSTOMY  12/15/2018   IR US GUIDE VASC ACCESS RIGHT  08/12/2017   MANDIBLE SURGERY  1999    I have reviewed the social history and family history with the patient and they are unchanged from previous note.  ALLERGIES:  has No Known Allergies.  MEDICATIONS:  Current Outpatient Medications  Medication Sig Dispense Refill   b complex vitamins tablet Take 1 tablet by mouth daily.     gabapentin (NEURONTIN) 100 MG capsule TAKE ONE CAPSULE BY MOUTH THREE TIMES DAILY 270 capsule 1   loperamide (IMODIUM) 2 MG capsule Take 1 capsule (2 mg total) by mouth as needed for diarrhea or loose stools. 30 capsule 0   loratadine (CLARITIN) 10 MG tablet Take 10 mg by mouth daily as needed for allergies.     magic mouthwash w/lidocaine SOLN Take 10 mLs by mouth 3 (three) times daily as needed for mouth pain. Swish and spit 10 ML by mouth 3 times daily as needed for mouth pain. 240 mL 0   meclizine (ANTIVERT) 25 MG tablet Take 25 mg by mouth 3 (three) times daily as needed for dizziness.     ondansetron (ZOFRAN-ODT) 8 MG disintegrating tablet TAKE 1 TABLET BY MOUTH EVERY 8 HOURS AS NEEDED FOR NAUSE OR VOMITING (Patient taking differently: Take 8 mg by mouth every 8 (eight) hours as needed for nausea or vomiting. ) 40 tablet 0   potassium chloride (K-DUR) 10 MEQ tablet Take 1 tablet (10 mEq total) by mouth 4 (four) times daily. 360 tablet 2   prochlorperazine (COMPAZINE) 10 MG tablet Take 1 tablet (10 mg total) by mouth every 6 (six)  hours as needed for nausea or vomiting. 40 tablet 2   Rivaroxaban (XARELTO) 15 MG TABS tablet Take 1 tablet (15 mg total) by mouth daily with supper for 30 days. 30 tablet 0   Sodium Chloride Flush (NORMAL SALINE FLUSH) 0.9 % SOLN Flush cholecystomy tube  daily with 100 ml saline 50 Syringe 1   traMADol (ULTRAM) 50 MG tablet Take 1 tablet (50 mg total) by mouth every 6 (six) hours as needed for moderate pain or severe pain. 30 tablet 0   No current facility-administered medications for this visit.    Facility-Administered Medications Ordered in Other Visits  Medication Dose Route Frequency Provider Last Rate Last Dose   sodium chloride flush (NS) 0.9 % injection 10 mL  10 mL Intracatheter Once [name], MD        PHYSICAL EXAMINATION: ECOG PERFORMANCE STATUS: 1 - Symptomatic but completely ambulatory  Vitals:   12/29/18 1043  BP: 107/65  Pulse: 80  Resp: 18  Temp: 98.1 F (36.7 C)  SpO2: 100%   Filed Weights   12/29/18 1043  Weight: 107 lb 1.6 oz (48.6 kg)  GENERAL:alert, no distress and comfortable SKIN: skin color, texture, turgor are normal, no rashes or significant lesions EYES: normal, Conjunctiva are pink and non-injected, sclera clear OROPHARYNX:no exudate, no erythema and lips, buccal mucosa, and tongue normal  NECK: supple, thyroid normal size, non-tender, without nodularity LYMPH:  no palpable lymphadenopathy in the cervical, axillary or inguinal LUNGS: clear to auscultation and percussion with normal breathing effort HEART: regular rate & rhythm and no murmurs and no lower extremity edema ABDOMEN:abdomen soft, non-tender and normal bowel sounds (+) right abdomen draining site Musculoskeletal:no cyanosis of digits and no clubbing  NEURO: alert & oriented x 3 with fluent speech, no focal motor/sensory deficits  LABORATORY DATA:  I have reviewed the data as listed CBC Latest Ref Rng & Units 12/29/2018 12/22/2018 12/19/2018  WBC 4.0 - 10.5 K/uL 4.0 6.0 4.8   Hemoglobin 12.0 - 15.0 g/dL 8.2(L) 7.7(L) 7.4(L)  Hematocrit 36.0 - 46.0 % 26.3(L) 23.9(L) 22.4(L)  Platelets 150 - 400 K/uL 186 118(L) 62(L)     CMP Latest Ref Rng & Units 12/22/2018 12/19/2018 12/18/2018  Glucose 70 - 99 mg/dL 112(H) 96 98  BUN 6 - 20 mg/dL 22(H) 18 17  Creatinine 0.44 - 1.00 mg/dL 1.13(H) 1.03(H) 1.06(H)  Sodium 135 - 145 mmol/L 141 137 137  Potassium 3.5 - 5.1 mmol/L 3.6 3.7 3.4(L)  Chloride 98 - 111 mmol/L 105 103 102  CO2 22 - 32 mmol/L 26 26 27   Calcium 8.9 - 10.3 mg/dL 9.1 9.0 8.9  Total Protein 6.5 - 8.1 g/dL 7.0 - -  Total Bilirubin 0.3 - 1.2 mg/dL 0.4 - -  Alkaline Phos 38 - 126 U/L 345(H) - -  AST 15 - 41 U/L 44(H) - -  ALT 0 - 44 U/L 42 - -      RADIOGRAPHIC STUDIES: I have personally reviewed the radiological images as listed and agreed with the findings in the report. No results found.   ASSESSMENT & PLAN:  Morgan Dawson is a 58 y.o. female with   1. 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 Treated with chemo.She was treated withfirst linechemoFOLFIRINOX with dose reduction. Due to neuropathyand cytopenia, chemo changed tosecond line 5-FU pump infusion and liposomal irinotecan every 2 weekswith dose reductiondue to cytopenia. -Unfortunately she recently had disease progression withworseningliver mets.  -She started second-line Gemcitabine and Abraxane2 weeks on/1 week offon 09/12/18. She tolerateswell with no significant side effects except hair loss and spiked fever after last 2 infusions.  -Since recent hospitalization for acute cholecystitis, she is recovery well and gaining weight. Her draining output is 33m daily, she denies dehydration.  -I reviewed her CT CAP from hospital on 12/14/18 which showed cholecystitis due to gallbladder stones, and interval response to chemo treatment on liver mets. I discussed with pt, she is responding well to current chemo regimen,  will continue  -Labs reviewed, CBC and CMP WNL except hg 8.2, K 3.4, BG 116, Albumin 3.2, AST 43, Alk phos 214. -Plan to restart treatment on 5/11 with Gem/Abraxanetoday at reduced dose given recent cholecystitis -f/u in 2 weeks  2. Weight lossandmalnutrition -Due to chemo and underlying metastatic cancer. -Continue nutritional supplement and f/u with dietician. -Appetite and eating much improved. Weight continues to increase over 100lbs.  3. Transaminitis -Due to underlying chemo and malignancy -Improved, overall mild and stable   4. Pancytopenia, secondary to chemo -Required chemo dose reduction. -S/p blood transfusion as needed with gh <8. Last on 09/20/18 -Mostly  resolved, normal WBC and PLT. Hg at 8.2 (12/29/18)  5. Hypokalemia, CKD Stage III -Sheis currently on KCL supplements -Potassium at 3.4 today (12/29/18)  6.Peripheral neuropathy, grade 2 -Currently on Neurontin 200 mg at night and B complexes. Continue. -She agreed to use ice bags with Abraxane infusion, continue -stable  7. Acute cholecystitis, status post cholecystectomy tube placement 4/23 per IR -she was seen by surgery during her hospitalization, surgery was not recommended due to high risk of complication, and concerns for cancer progression when holding chemotherapy for surgery -Drain intact, currently has decreased to 18m slightly bloody drainage per day -afebrile and no chills.  -We reviewed, her CT CAP from 12/14/18 shows she has numerus gallstones. Will monitor for any inflammation.  -she only has mild discomfort at draining site. She will continue to f/u with IR.   8. LE DVT -Diagnosed on December 15, 2018, when she was hospitalized for cholecystitis -Due to her bloody biliary drainage, and moderate thrombocytopenia from chemotherapy, she is on low-dose Eliquis 18mdaily (no loading dose), she is tolerating well and will continue    9.Goal of care discussion -She is full code now -She  understands her cancer is incurable at this stage, and the goal of therapy is palliative, to prolong his life, and prevent cancer related symptoms      Plan -Labs and recent CT CAP reviewed with patient -Proceed with next cycle chemo next week, will reduce chemo dose by 20% due to her recent cholecystitis -f/u on 5/18    No problem-specific Assessment & Plan notes found for this encounter.   No orders of the defined types were placed in this encounter.  All questions were answered. The patient knows to call the clinic with any problems, questions or concerns. No barriers to learning was detected. I spent 20 minutes counseling the patient face to face. The total time spent in the appointment was 25 minutes and more than 50% was on counseling and review of test results     YaTruitt MerleMD 12/29/2018   I, AmJoslyn Devonam acting as scribe for YaTruitt MerleMD.   I have reviewed the above documentation for accuracy and completeness, and I agree with the above.

## 2018-12-27 NOTE — Telephone Encounter (Signed)
Received vm message from patient requesting a prescription for 10 cc saline syringes to flush her cholecystectomy tube. TCT patient to confirm how often she is flushing that tube. She states she flushes it daily. She is having no problems with flushing and feels well overall Prescription sent to [facility] at patient's request.

## 2018-12-29 ENCOUNTER — Inpatient Hospital Stay: Payer: 59 | Attending: Hematology | Admitting: Hematology

## 2018-12-29 ENCOUNTER — Inpatient Hospital Stay: Payer: 59

## 2018-12-29 ENCOUNTER — Telehealth: Payer: Self-pay | Admitting: Hematology

## 2018-12-29 ENCOUNTER — Other Ambulatory Visit: Payer: Self-pay

## 2018-12-29 VITALS — BP 107/65 | HR 80 | Temp 98.1°F | Resp 18 | Ht 65.0 in | Wt 107.1 lb

## 2018-12-29 DIAGNOSIS — D61810 Antineoplastic chemotherapy induced pancytopenia: Secondary | ICD-10-CM | POA: Diagnosis not present

## 2018-12-29 DIAGNOSIS — I82402 Acute embolism and thrombosis of unspecified deep veins of left lower extremity: Secondary | ICD-10-CM | POA: Insufficient documentation

## 2018-12-29 DIAGNOSIS — C787 Secondary malignant neoplasm of liver and intrahepatic bile duct: Secondary | ICD-10-CM | POA: Insufficient documentation

## 2018-12-29 DIAGNOSIS — G620 Drug-induced polyneuropathy: Secondary | ICD-10-CM | POA: Insufficient documentation

## 2018-12-29 DIAGNOSIS — C259 Malignant neoplasm of pancreas, unspecified: Secondary | ICD-10-CM

## 2018-12-29 DIAGNOSIS — R634 Abnormal weight loss: Secondary | ICD-10-CM | POA: Insufficient documentation

## 2018-12-29 DIAGNOSIS — Z7901 Long term (current) use of anticoagulants: Secondary | ICD-10-CM | POA: Insufficient documentation

## 2018-12-29 DIAGNOSIS — Z5111 Encounter for antineoplastic chemotherapy: Secondary | ICD-10-CM | POA: Diagnosis present

## 2018-12-29 DIAGNOSIS — E876 Hypokalemia: Secondary | ICD-10-CM

## 2018-12-29 DIAGNOSIS — C251 Malignant neoplasm of body of pancreas: Secondary | ICD-10-CM

## 2018-12-29 DIAGNOSIS — R740 Nonspecific elevation of levels of transaminase and lactic acid dehydrogenase [LDH]: Secondary | ICD-10-CM | POA: Diagnosis not present

## 2018-12-29 DIAGNOSIS — Z95828 Presence of other vascular implants and grafts: Secondary | ICD-10-CM

## 2018-12-29 DIAGNOSIS — E46 Unspecified protein-calorie malnutrition: Secondary | ICD-10-CM | POA: Diagnosis not present

## 2018-12-29 LAB — COMPREHENSIVE METABOLIC PANEL
ALT: 41 U/L (ref 0–44)
AST: 43 U/L — ABNORMAL HIGH (ref 15–41)
Albumin: 3.2 g/dL — ABNORMAL LOW (ref 3.5–5.0)
Alkaline Phosphatase: 214 U/L — ABNORMAL HIGH (ref 38–126)
Anion gap: 7 (ref 5–15)
BUN: 16 mg/dL (ref 6–20)
CO2: 28 mmol/L (ref 22–32)
Calcium: 9 mg/dL (ref 8.9–10.3)
Chloride: 104 mmol/L (ref 98–111)
Creatinine, Ser: 1 mg/dL (ref 0.44–1.00)
GFR calc Af Amer: >60 mL/min (ref >60)
GFR calc non Af Amer: >60 mL/min (ref >60)
Glucose, Bld: 116 mg/dL — ABNORMAL HIGH (ref 70–99)
Potassium: 3.4 mmol/L — ABNORMAL LOW (ref 3.5–5.1)
Sodium: 139 mmol/L (ref 135–145)
Total Bilirubin: 0.3 mg/dL (ref 0.3–1.2)
Total Protein: 6.9 g/dL (ref 6.5–8.1)

## 2018-12-29 LAB — CBC WITH DIFFERENTIAL/PLATELET
Abs Immature Granulocytes: 0.02 10*3/uL (ref 0.00–0.07)
Basophils Absolute: 0.1 10*3/uL (ref 0.0–0.1)
Basophils Relative: 1 %
Eosinophils Absolute: 0.1 10*3/uL (ref 0.0–0.5)
Eosinophils Relative: 2 %
HCT: 26.3 % — ABNORMAL LOW (ref 36.0–46.0)
Hemoglobin: 8.2 g/dL — ABNORMAL LOW (ref 12.0–15.0)
Immature Granulocytes: 1 %
Lymphocytes Relative: 12 %
Lymphs Abs: 0.5 10*3/uL — ABNORMAL LOW (ref 0.7–4.0)
MCH: 34.2 pg — ABNORMAL HIGH (ref 26.0–34.0)
MCHC: 31.2 g/dL (ref 30.0–36.0)
MCV: 109.6 fL — ABNORMAL HIGH (ref 80.0–100.0)
Monocytes Absolute: 0.4 10*3/uL (ref 0.1–1.0)
Monocytes Relative: 10 %
Neutro Abs: 3 10*3/uL (ref 1.7–7.7)
Neutrophils Relative %: 74 %
Platelets: 186 10*3/uL (ref 150–400)
RBC: 2.4 MIL/uL — ABNORMAL LOW (ref 3.87–5.11)
RDW: 17.1 % — ABNORMAL HIGH (ref 11.5–15.5)
WBC: 4 10*3/uL (ref 4.0–10.5)
nRBC: 0 % (ref 0.0–0.2)

## 2018-12-29 MED ORDER — SODIUM CHLORIDE 0.9% FLUSH
10.0000 mL | Freq: Once | INTRAVENOUS | Status: AC
  Administered 2018-12-29: 10 mL
  Filled 2018-12-29: qty 10

## 2018-12-29 MED ORDER — HEPARIN SOD (PORK) LOCK FLUSH 100 UNIT/ML IV SOLN
500.0000 [IU] | Freq: Once | INTRAVENOUS | Status: AC
  Administered 2018-12-29: 500 [IU]
  Filled 2018-12-29: qty 5

## 2018-12-29 NOTE — Patient Instructions (Signed)

## 2018-12-29 NOTE — Telephone Encounter (Signed)
Scheduled appt per 5/7 los. °

## 2018-12-30 IMAGING — CT NM PET TUM IMG RESTAG (PS) SKULL BASE T - THIGH
8 series · 25 of 25 positions shown · non-contrast
Comparison: PET-CT 07/29/2017. Chest, abdomen and pelvic CT
10/14/2017.

CLINICAL DATA: Subsequent treatment strategy for metastatic
pancreatic cancer diagnosed 07/23/2017.

EXAM:
NUCLEAR MEDICINE PET SKULL BASE TO THIGH
TECHNIQUE: 5.64 mCi F-18 FDG was injected intravenously. Full-ring PET imaging
was performed from the skull base to thigh after the radiotracer. CT
data was obtained and used for attenuation correction and anatomic
localization.
Fasting blood glucose: 108 mg/dl

[Series 3: pet sk_thigh ac · axial · 5.0mm · 4.07mm/px · z∈[-952,-44]mm · 6 of 228 slices shown]
[im 1/228]
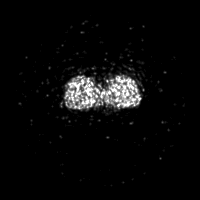
[im 46/228]
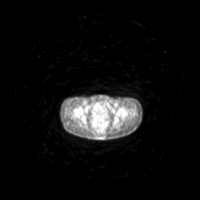
[im 91/228]
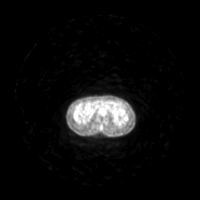
[im 137/228]
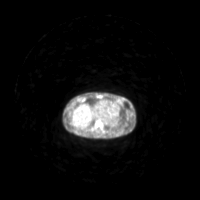
[im 182/228]
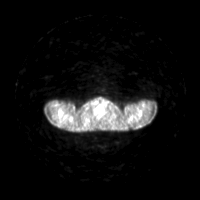
[im 228/228]
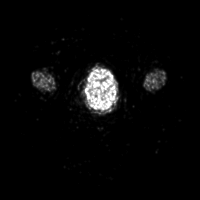

[Series 4: ct sk_thigh 5.0 b31f · axial · 5.0mm · 0.88mm/px · z∈[-952,-44]mm · 5 of 228 slices shown]
[im 1/228]
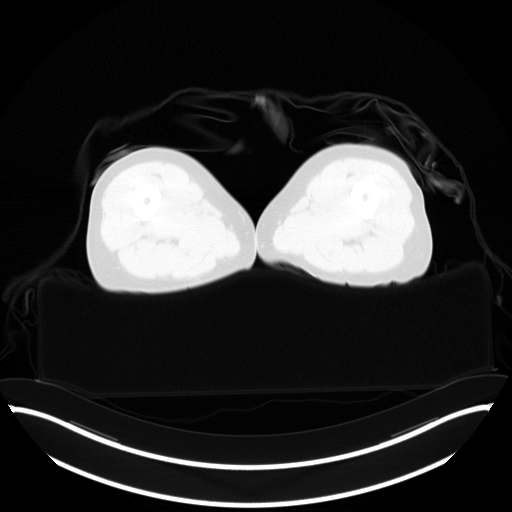
[im 57/228]
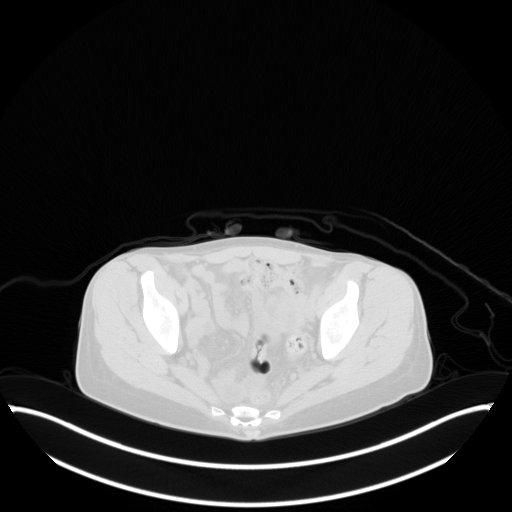
[im 114/228]
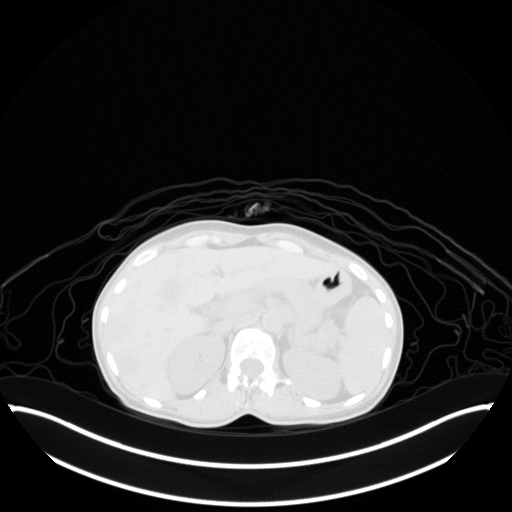
[im 171/228]
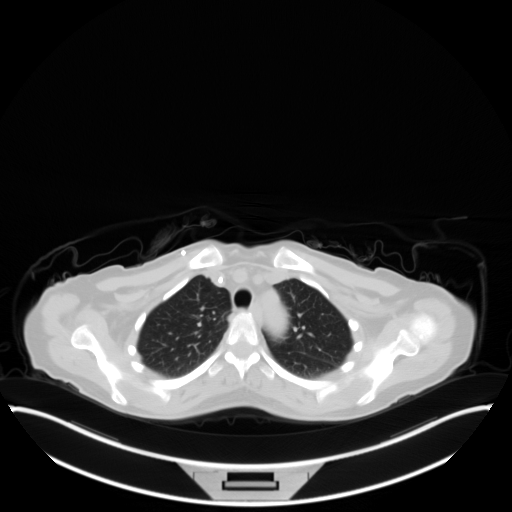
[im 228/228  brain]
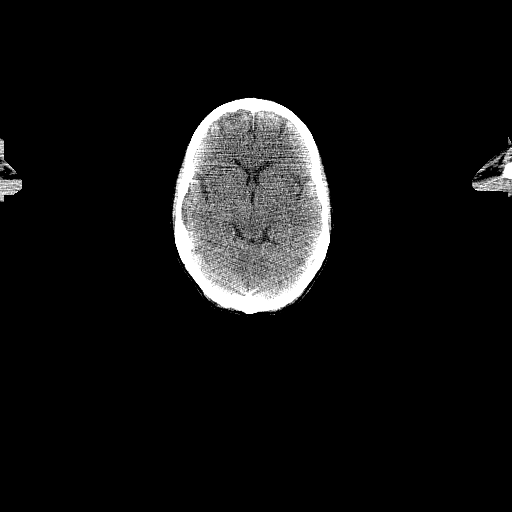

[Series 5: pet sk_thigh nac · axial · 5.0mm · 4.07mm/px · z∈[-952,-44]mm · 5 of 228 slices shown]
[im 1/228]
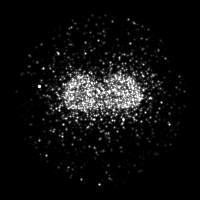
[im 57/228]
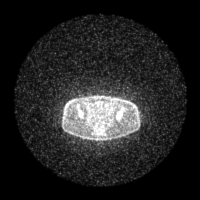
[im 114/228]
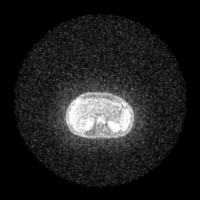
[im 171/228]
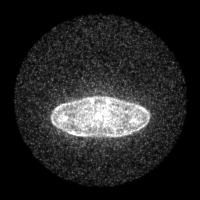
[im 228/228]
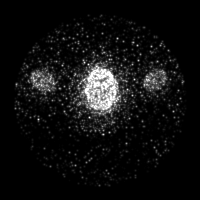

[Series 8: ct sk_thigh 5.0 b70f lung_bone · axial · 5.0mm · 0.58mm/px · 1 of 62 slices shown]
[im 1/62  bone]
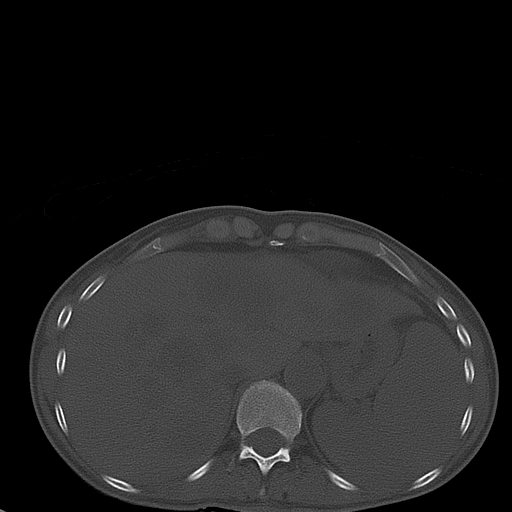

[Series 603: range-ct sk_thigh 5.0 (id)<alpha range> · 1 of 57 slices shown (1 of 2)]
[im 1/57]
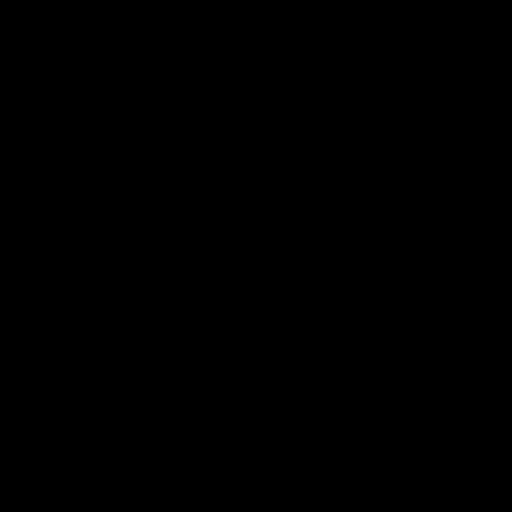

[Series 604: mip range 2 · coronal · 1.88mm/px · 1 of 32 slices shown]
[im 1/32]
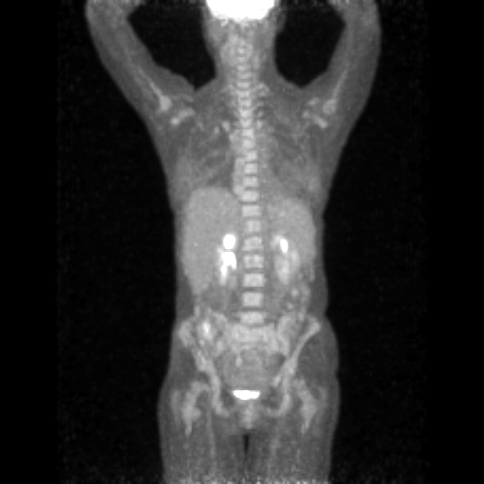

[Series 605: range-ct sk_thigh 5.0 (id)<alpha range> · 5 of 216 slices shown (2 of 2)]
[im 1/216]
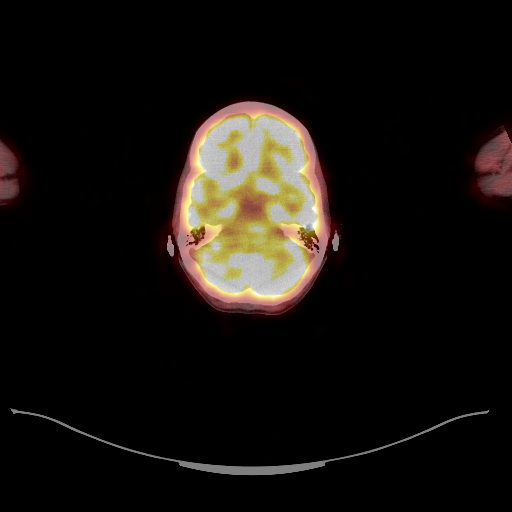
[im 54/216]
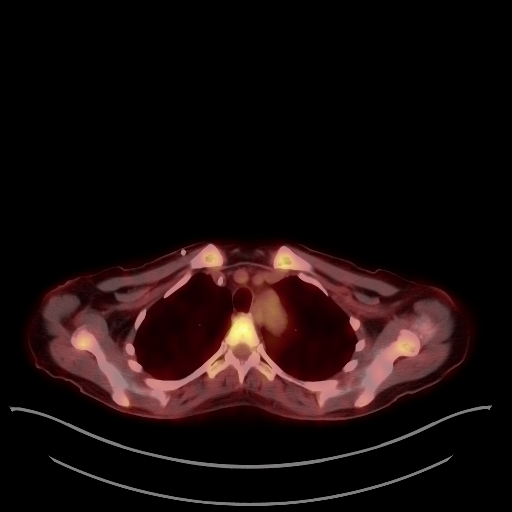
[im 108/216]
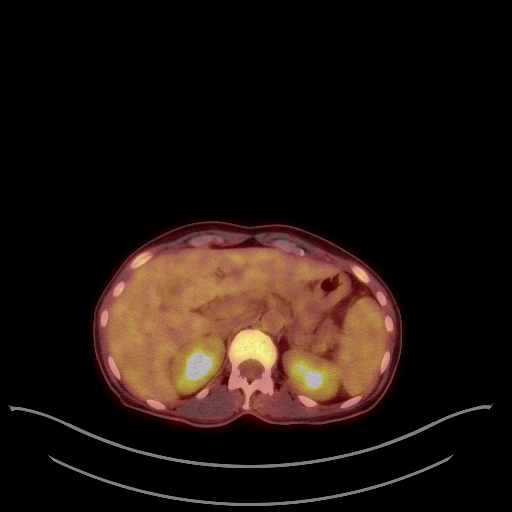
[im 162/216]
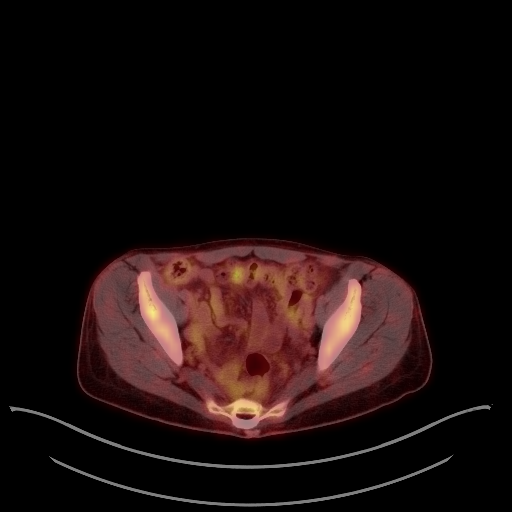
[im 216/216]
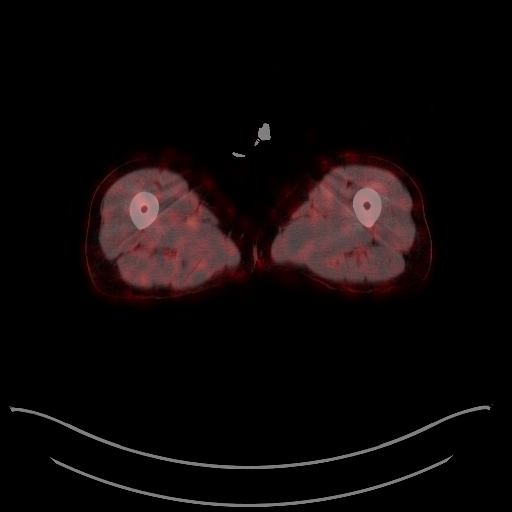

[Series 1135: results mm oncology reading · 5.0mm · 0.83mm/px · 1 of 3 slices shown]
[im 1/3]
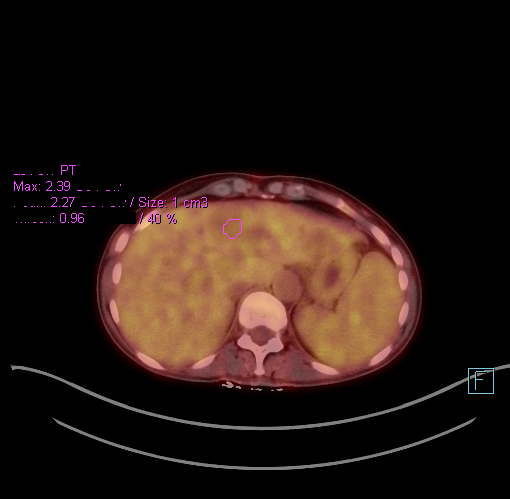

[25 of 25 positions shown; findings below may reference images not displayed]

FINDINGS: Mediastinal blood pool activity: SUV max

NECK:

No hypermetabolic cervical lymph nodes are identified.There are no
lesions of the pharyngeal mucosal space.

Incidental CT findings: none

CHEST:

There are no hypermetabolic mediastinal, hilar or axillary lymph
nodes. There is no hypermetabolic pulmonary activity.

Incidental CT findings: Stable tiny calcified right upper lobe
granuloma. There is mild atelectasis at both lung bases. Right IJ
Port-A-Cath extends to the superior cavoatrial junction.

ABDOMEN/PELVIS:

Compared with the prior studies, the extensive hepatic metastatic
disease has decreased in size. There is no residual hypermetabolic
activity associated with any of the lesions. Index lesion in the
left hepatic lobe measures 3.6 x 4.8 cm on image 104/4 (8.3 x 6.0 cm
on prior PET-CT). No residual abnormal activity within the pancreas.
The spleen kidneys appear unremarkable. There is no hypermetabolic
nodal activity in the abdomen or pelvis.

Incidental CT findings: Cholelithiasis.

SKELETON:

There is no hypermetabolic activity to suggest osseous metastatic
disease. Generalized marrow activity attributed to interval therapy.

Incidental CT findings: Chronic bilateral L5 pars defects.
IMPRESSION: 1. Although the liver metastases are still visible on the CT images,
they are significantly smaller and retain no abnormal metabolic
activity, consistent with response to therapy.
2. No abnormal activity within the pancreas.
3. No disease progression identified.
4. Cholelithiasis.

## 2018-12-31 ENCOUNTER — Encounter: Payer: Self-pay | Admitting: Hematology

## 2019-01-02 ENCOUNTER — Inpatient Hospital Stay: Payer: 59

## 2019-01-02 ENCOUNTER — Other Ambulatory Visit: Payer: Self-pay

## 2019-01-02 VITALS — BP 109/69 | HR 63 | Temp 98.0°F | Resp 16 | Ht 65.0 in | Wt 107.2 lb

## 2019-01-02 DIAGNOSIS — Z7189 Other specified counseling: Secondary | ICD-10-CM

## 2019-01-02 DIAGNOSIS — C787 Secondary malignant neoplasm of liver and intrahepatic bile duct: Secondary | ICD-10-CM

## 2019-01-02 DIAGNOSIS — Z95828 Presence of other vascular implants and grafts: Secondary | ICD-10-CM

## 2019-01-02 DIAGNOSIS — C251 Malignant neoplasm of body of pancreas: Secondary | ICD-10-CM

## 2019-01-02 DIAGNOSIS — C259 Malignant neoplasm of pancreas, unspecified: Secondary | ICD-10-CM

## 2019-01-02 DIAGNOSIS — Z5111 Encounter for antineoplastic chemotherapy: Secondary | ICD-10-CM | POA: Diagnosis not present

## 2019-01-02 LAB — CBC WITH DIFFERENTIAL/PLATELET
Abs Immature Granulocytes: 0.02 10*3/uL (ref 0.00–0.07)
Basophils Absolute: 0.1 10*3/uL (ref 0.0–0.1)
Basophils Relative: 2 %
Eosinophils Absolute: 0.1 10*3/uL (ref 0.0–0.5)
Eosinophils Relative: 3 %
HCT: 28.7 % — ABNORMAL LOW (ref 36.0–46.0)
Hemoglobin: 9.1 g/dL — ABNORMAL LOW (ref 12.0–15.0)
Immature Granulocytes: 1 %
Lymphocytes Relative: 18 %
Lymphs Abs: 0.6 10*3/uL — ABNORMAL LOW (ref 0.7–4.0)
MCH: 35.1 pg — ABNORMAL HIGH (ref 26.0–34.0)
MCHC: 31.7 g/dL (ref 30.0–36.0)
MCV: 110.8 fL — ABNORMAL HIGH (ref 80.0–100.0)
Monocytes Absolute: 0.5 10*3/uL (ref 0.1–1.0)
Monocytes Relative: 13 %
Neutro Abs: 2.3 10*3/uL (ref 1.7–7.7)
Neutrophils Relative %: 63 %
Platelets: 136 10*3/uL — ABNORMAL LOW (ref 150–400)
RBC: 2.59 MIL/uL — ABNORMAL LOW (ref 3.87–5.11)
RDW: 16.2 % — ABNORMAL HIGH (ref 11.5–15.5)
WBC: 3.6 10*3/uL — ABNORMAL LOW (ref 4.0–10.5)
nRBC: 0 % (ref 0.0–0.2)

## 2019-01-02 LAB — COMPREHENSIVE METABOLIC PANEL
ALT: 48 U/L — ABNORMAL HIGH (ref 0–44)
AST: 45 U/L — ABNORMAL HIGH (ref 15–41)
Albumin: 3.4 g/dL — ABNORMAL LOW (ref 3.5–5.0)
Alkaline Phosphatase: 203 U/L — ABNORMAL HIGH (ref 38–126)
Anion gap: 9 (ref 5–15)
BUN: 22 mg/dL — ABNORMAL HIGH (ref 6–20)
CO2: 27 mmol/L (ref 22–32)
Calcium: 9.3 mg/dL (ref 8.9–10.3)
Chloride: 105 mmol/L (ref 98–111)
Creatinine, Ser: 1.05 mg/dL — ABNORMAL HIGH (ref 0.44–1.00)
GFR calc Af Amer: >60 mL/min (ref >60)
GFR calc non Af Amer: 59 mL/min — ABNORMAL LOW (ref >60)
Glucose, Bld: 102 mg/dL — ABNORMAL HIGH (ref 70–99)
Potassium: 3.7 mmol/L (ref 3.5–5.1)
Sodium: 141 mmol/L (ref 135–145)
Total Bilirubin: 0.4 mg/dL (ref 0.3–1.2)
Total Protein: 7.2 g/dL (ref 6.5–8.1)

## 2019-01-02 MED ORDER — SODIUM CHLORIDE 0.9 % IV SOLN
Freq: Once | INTRAVENOUS | Status: AC
  Administered 2019-01-02: 11:00:00 via INTRAVENOUS
  Filled 2019-01-02: qty 250

## 2019-01-02 MED ORDER — SODIUM CHLORIDE 0.9% FLUSH
10.0000 mL | Freq: Once | INTRAVENOUS | Status: AC
  Administered 2019-01-02: 10 mL
  Filled 2019-01-02: qty 10

## 2019-01-02 MED ORDER — PACLITAXEL PROTEIN-BOUND CHEMO INJECTION 100 MG
100.0000 mg/m2 | Freq: Once | INTRAVENOUS | Status: AC
  Administered 2019-01-02: 13:00:00 150 mg via INTRAVENOUS
  Filled 2019-01-02: qty 30

## 2019-01-02 MED ORDER — SODIUM CHLORIDE 0.9% FLUSH
10.0000 mL | INTRAVENOUS | Status: DC | PRN
  Administered 2019-01-02: 15:00:00 10 mL
  Filled 2019-01-02: qty 10

## 2019-01-02 MED ORDER — SODIUM CHLORIDE 0.9 % IV SOLN
800.0000 mg/m2 | Freq: Once | INTRAVENOUS | Status: AC
  Administered 2019-01-02: 14:00:00 1178 mg via INTRAVENOUS
  Filled 2019-01-02: qty 30.98

## 2019-01-02 MED ORDER — HEPARIN SOD (PORK) LOCK FLUSH 100 UNIT/ML IV SOLN
500.0000 [IU] | Freq: Once | INTRAVENOUS | Status: AC | PRN
  Administered 2019-01-02: 15:00:00 500 [IU]
  Filled 2019-01-02: qty 5

## 2019-01-02 MED ORDER — PROCHLORPERAZINE MALEATE 10 MG PO TABS
10.0000 mg | ORAL_TABLET | Freq: Once | ORAL | Status: AC
  Administered 2019-01-02: 12:00:00 10 mg via ORAL

## 2019-01-02 MED ORDER — PROCHLORPERAZINE MALEATE 10 MG PO TABS
ORAL_TABLET | ORAL | Status: AC
  Filled 2019-01-02: qty 1

## 2019-01-02 NOTE — Patient Instructions (Signed)
[facility] Discharge Instructions for Patients Receiving Chemotherapy  Today you received the following chemotherapy agents:  Gemzar (gemcitabine), Abraxane (protein bound paclitaxel)  To help prevent nausea and vomiting after your treatment, we encourage you to take your nausea medication as prescribed.   If you develop nausea and vomiting that is not controlled by your nausea medication, call the clinic.   BELOW ARE SYMPTOMS THAT SHOULD BE REPORTED IMMEDIATELY:  *FEVER GREATER THAN 100.5 F  *CHILLS WITH OR WITHOUT FEVER  NAUSEA AND VOMITING THAT IS NOT CONTROLLED WITH YOUR NAUSEA MEDICATION  *UNUSUAL SHORTNESS OF BREATH  *UNUSUAL BRUISING OR BLEEDING  TENDERNESS IN MOUTH AND THROAT WITH OR WITHOUT PRESENCE OF ULCERS  *URINARY PROBLEMS  *BOWEL PROBLEMS  UNUSUAL RASH Items with * indicate a potential emergency and should be followed up as soon as possible.  Feel free to call the clinic should you have any questions or concerns. The clinic phone number is [phone].  Please show the [facility] at check-in to the Emergency Department and triage nurse.

## 2019-01-04 ENCOUNTER — Other Ambulatory Visit: Payer: Self-pay | Admitting: General Surgery

## 2019-01-04 DIAGNOSIS — K819 Cholecystitis, unspecified: Secondary | ICD-10-CM

## 2019-01-06 NOTE — Progress Notes (Signed)
[place]   [phone] Fax:([phone]) [phone]   Clinic Follow up Note   Patient Care Team: [name], MD as PCP - General (Family Medicine) [name], MD as Consulting Physician (Otolaryngology) [name], MD as Consulting Physician (Oncology)  Date of Service:  01/09/2019  CHIEF COMPLAINT: F/u of pancreatic cancer  SUMMARY OF ONCOLOGIC HISTORY: Oncology History   Cancer Staging Pancreatic cancer [facility]) Staging form: Exocrine Pancreas, AJCC 8th Edition - Clinical stage from 08/06/2017: Stage IV (cTX, cN0, pM1) - Signed by [name], MD on 08/12/2017       Pancreatic cancer ([place])   07/23/2017 Imaging    US abdomen limited RUQ 07/23/17 IMPRESSION: 1. Cholelithiasis.  No secondary signs of acute cholecystitis. 2. Multiple solid liver masses measuring up to 6.5 cm in the left lobe of the liver, evaluation for metastatic disease is recommended. These results will be called to the ordering clinician or representative by the Radiologist Assistant, and communication documented in the PACS or zVision Dashboard.    07/23/2017 Imaging    CT Abdomen W Contrast 07/23/17 IMPRESSION: 1. Widespread metastatic disease throughout the liver. No clear primary malignancy identified in the abdomen. The pelvis was not imaged. Tissue sampling recommended. 2. Probable adenopathy superior to the pancreatic tail. No evidence of pancreatic mass. 3. Suspected incidental hemangioma inferiorly in the right hepatic lobe. 4. Nonspecific nodularity in the breasts. The patient has undergone recent (03/25/2017 and 04/01/2017) mammography and ultrasound.    07/29/2017 Initial Diagnosis    Metastasis to liver of unknown origin ([place])    07/29/2017 PET scan    PET 07/29/17  IMPRESSION: 1. Numerous bulky liver masses are hypermetabolic compatible with malignancy. 2. Accentuated activity within or along the pancreatic tail, likely represent a primary pancreatic tumor.  Consider pancreatic protocol MRI to further work this up. 3. The peripancreatic lymph node shown above the pancreatic tail is mildly hypermetabolic favoring malignancy. 4.  Prominent stool throughout the colon favors constipation. 5. 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    08/05/2017 Pathology Results    Liver Biopsy  Diagnosis 08/05/17 Liver, needle/core biopsy - CARCINOMA. - SEE COMMENT. Microscopic Comment The malignant cells are positive for cytokeratin 7. They are negative for arginase, CDX2, cytokeratin 20, estrogen receptor, GATA-3, GCDFP, Glypican 3, Hep Par 1, Napsin A, and TTF-1. This immunohistochemical is nonspecific. Possibly primary sources include pancreatobiliary and upper gastrointestinal. Radiologic correlation is necessary. Of note, organ specific markers (GATA-3, GCDFP-breast, TTF-1, Napsin A-lung, and CDX2-colon) are negative. (JBK:ecj 08/09/2017)    08/21/2017 - 02/10/2018 Chemotherapy    FOLFIRINOX every 2 weeks starting 08/21/17. Dose reduced due to neuropahty and cytopenia     10/14/2017 Imaging    CT CAP WO Contrast 10/14/17 IMPRESSION: Evidence of known numerous liver metastases without significant interval change. No other evidence of metastatic disease within the chest, abdomen or pelvis. Cholelithiasis. Tiny pericardial effusion.    12/02/2017 Genetic Testing    Negative for pathogenic mutation.  The genes analyzed were the 83 genes on [facility]'s Multi-Cancer panel (ALK, APC, ATM, AXIN2, BAP1, BARD1, BLM, BMPR1A, BRCA1, BRCA2, BRIP1, CASR, CDC73, CDH1, CDK4, CDKN1B, CDKN1C, CDKN2A, CEBPA, CHEK2, CTNNA1, DICER1, DIS3L2, EGFR, EPCAM, FH, FLCN, GATA2, GPC3, GREM1, HOXB13, HRAS, KIT, MAX, MEN1, MET, MITF, MLH1, MSH2, MSH3, MSH6, MUTYH, NBN, NF1, NF2, NTHL1, PALB2, PDGFRA, PHOX2B, PMS2, POLD1, POLE, POT1, PRKAR1A, PTCH1, PTEN, RAD50, RAD51C, RAD51D, RB1, RECQL4, RET, RUNX1, SDHA, SDHAF2, SDHB, SDHC, SDHD, SMAD4, SMARCA4, SMARCB1, SMARCE1, STK11, SUFU, TERC,  TERT, TMEM127, TP53, TSC1,  TSC2, VHL, WRN, WT1).    12/17/2017 PET scan    IMPRESSION: 1. Although the liver metastases are still visible on the CT images, they are significantly smaller and retain no abnormal metabolic activity, consistent with response to therapy. 2. No abnormal activity within the pancreas. 3. No disease progression identified. 4. Cholelithiasis.    02/21/2018 PET scan    02/21/2018 PET Scan  IMPRESSION: 1. There are 2 new small nodules identified within the right lung which measure up to 5 mm. These are too small to reliably characterize by PET-CT, but warrant close interval follow-up. 2. Again noted are multifocal liver metastasis. The target lesion within the left lobe is slightly decreased in size when compared with the previous exam. Similar to previous exam there is no abnormal hypermetabolism above background liver activity identified within the liver lesions. 3. Gallstones.    02/22/2018 - 08/25/2018 Chemotherapy    5-FU pump infusion and liposomal irinotecan, every 2 weeks.  First cycle dose reduced due to cytopenia in the travel. Stopped on 08/25/2018 due to disease progression.     05/17/2018 Imaging    05/17/2018 PET Scan IMPRESSION: 1. Mixed response. The 2 small right lung nodules have decreased in size in the interval. Single focus of increased uptake within the liver is new from 02/21/2018 and concerning for recurrent metabolically active liver metastasis. 2. Splenomegaly.  New from previous exam. 3. Diffusely increased bone marrow activity, likely reflecting treatment related changes.    09/01/2018 PET scan    PET 09/01/18 IMPRESSION: 1. Unfortunately there recurrence of hepatic metastasis with multiple new hypermetabolic lesions in LEFT and RIGHT hepatic lobe. 2. New extra hepatic site of malignancy which appears associated the mid pancreas. Difficult to define lesion on noncontrast exam. 3. Diffuse marrow activity is favored benign. 4. No evidence  of pulmonary metastasis.    09/12/2018 -  Chemotherapy    second-line Gemcitabine and Abraxane for 2 weeks on, 1 week off starting 09/12/2018. Due to neuropathy, I will start her with dose reduced Abraxane.     12/14/2018 Imaging    CT CAP  IMPRESSION: CT demonstrates acute cholecystitis, with choledocholithiasis of the cystic duct.  These results were discussed by telephone at the time of interpretation on 12/14/2018 at 5:05 pm with Dr. Burr Medico.  Redemonstration of known liver metastases, with positive response to therapy, interval reduction in size of all lesions, with multiple demonstrating significant internal necrosis.  No acute finding of the chest.  Ancillary findings as above.     Pancreatic cancer metastasized to liver ([place])   08/11/2017 Initial Diagnosis    Pancreatic cancer metastasized to liver ([place])    09/12/2018 -  Chemotherapy    second-line Gemcitabine and Abraxane for 2 weeks on, 1 week off starting 09/12/2018. Due to neuropathy, I will start her with dose reduced Abraxane.       CURRENT THERAPY:  Second-line Gemcitabine and Abraxane for 2 weeks on, 1 week off starting 09/12/2018. Cycle 5 postponed due to acute cholecystitis and only completed week 1.   INTERVAL HISTORY:  Morgan Dawson is here for a follow up. She is here alone. She notes 2 days after chemo she spiked a fever 2 days later she had developed abdominal pain for a few days. She notes having leakage as well from her drain. She has not contacted IR about her drain leaking. She changed her dressing twice a day. Her drainage is clearer and about had a bag full a day. She denies current pain.  Today around 11am she had a bowel of cereal. She denies cough or fever today.     REVIEW OF SYSTEMS:   Constitutional: Denies fevers, chills or abnormal weight loss Eyes: Denies blurriness of vision Ears, nose, mouth, throat, and face: Denies mucositis or sore throat Respiratory: Denies cough, dyspnea or  wheezes Cardiovascular: Denies palpitation, chest discomfort or lower extremity swelling Gastrointestinal:  Denies nausea, heartburn or change in bowel habits (+)drain leakage and abdominal tenderness Skin: Denies abnormal skin rashes Lymphatics: Denies new lymphadenopathy or easy bruising Neurological:Denies numbness, tingling or new weaknesses Behavioral/Psych: Mood is stable, no new changes  All other systems were reviewed with the patient and are negative.  MEDICAL HISTORY:  Past Medical History:  Diagnosis Date   Diarrhea of presumed infectious origin 01/12/2018   Gastroenteritis 01/12/2018   Genetic testing 12/02/2017   Multi-Cancer panel (83 genes) @ [facility] - No pathogenic mutations detected   Meniere disease 2016   Pancreatitis     SURGICAL HISTORY: Past Surgical History:  Procedure Laterality Date   CERVICAL ABLATION     ENDOMETRIAL ABLATION  2011   IR CHOLANGIOGRAM EXISTING TUBE  01/09/2019   IR FLUORO GUIDE PORT INSERTION RIGHT  08/12/2017   IR PERC CHOLECYSTOSTOMY  12/15/2018   IR US GUIDE VASC ACCESS RIGHT  08/12/2017   MANDIBLE SURGERY  1999    I have reviewed the social history and family history with the patient and they are unchanged from previous note.  ALLERGIES:  has No Known Allergies.  MEDICATIONS:  Current Outpatient Medications  Medication Sig Dispense Refill   b complex vitamins tablet Take 1 tablet by mouth daily.     gabapentin (NEURONTIN) 100 MG capsule TAKE ONE CAPSULE BY MOUTH THREE TIMES DAILY 270 capsule 1   loperamide (IMODIUM) 2 MG capsule Take 1 capsule (2 mg total) by mouth as needed for diarrhea or loose stools. 30 capsule 0   loratadine (CLARITIN) 10 MG tablet Take 10 mg by mouth daily as needed for allergies.     magic mouthwash w/lidocaine SOLN Take 10 mLs by mouth 3 (three) times daily as needed for mouth pain. Swish and spit 10 ML by mouth 3 times daily as needed for mouth pain. 240 mL 0   meclizine (ANTIVERT) 25 MG  tablet Take 25 mg by mouth 3 (three) times daily as needed for dizziness.     ondansetron (ZOFRAN-ODT) 8 MG disintegrating tablet TAKE 1 TABLET BY MOUTH EVERY 8 HOURS AS NEEDED FOR NAUSE OR VOMITING (Patient taking differently: Take 8 mg by mouth every 8 (eight) hours as needed for nausea or vomiting. ) 40 tablet 0   potassium chloride (K-DUR) 10 MEQ tablet Take 1 tablet (10 mEq total) by mouth 4 (four) times daily. 360 tablet 2   prochlorperazine (COMPAZINE) 10 MG tablet Take 1 tablet (10 mg total) by mouth every 6 (six) hours as needed for nausea or vomiting. 40 tablet 2   Rivaroxaban (XARELTO) 15 MG TABS tablet Take 1 tablet (15 mg total) by mouth daily with supper for 30 days. 30 tablet 0   Sodium Chloride Flush (NORMAL SALINE FLUSH) 0.9 % SOLN Flush cholecystomy tube  daily with 100 ml saline 50 Syringe 1   traMADol (ULTRAM) 50 MG tablet Take 1 tablet (50 mg total) by mouth every 6 (six) hours as needed for moderate pain or severe pain. 30 tablet 0   No current facility-administered medications for this visit.    Facility-Administered Medications Ordered in Other Visits  Medication  Dose Route Frequency Provider Last Rate Last Dose   lidocaine (XYLOCAINE) 1 % (with pres) injection            lidocaine (XYLOCAINE) 1 % (with pres) injection    PRN Arne Cleveland, MD   5 mL at 01/09/19 1501   sodium chloride flush (NS) 0.9 % injection 10 mL  10 mL Intracatheter Once [name], MD        PHYSICAL EXAMINATION: ECOG PERFORMANCE STATUS: 1 - Symptomatic but completely ambulatory  Vitals:   01/09/19 1326  BP: 120/75  Pulse: 75  Resp: 17  Temp: 98.7 F (37.1 C)  SpO2: 100%   Filed Weights   01/09/19 1326  Weight: 106 lb 11.2 oz (48.4 kg)    GENERAL:alert, no distress and comfortable SKIN: skin color, texture, turgor are normal, no rashes or significant lesions EYES: normal, Conjunctiva are pink and non-injected, sclera clear OROPHARYNX:no exudate, no erythema and lips, buccal  mucosa, and tongue normal  NECK: supple, thyroid normal size, non-tender, without nodularity LYMPH:  no palpable lymphadenopathy in the cervical, axillary or inguinal LUNGS: clear to auscultation and percussion with normal breathing effort HEART: regular rate & rhythm and no murmurs and no lower extremity edema ABDOMEN:abdomen soft, normal bowel sounds (+) lower right abdomen drain with mild leakage (+) epigastric abdominal tenderness Musculoskeletal:no cyanosis of digits and no clubbing  NEURO: alert & oriented x 3 with fluent speech, no focal motor/sensory deficits  LABORATORY DATA:  I have reviewed the data as listed CBC Latest Ref Rng & Units 01/09/2019 01/02/2019 12/29/2018  WBC 4.0 - 10.5 K/uL 1.7(L) 3.6(L) 4.0  Hemoglobin 12.0 - 15.0 g/dL 8.7(L) 9.1(L) 8.2(L)  Hematocrit 36.0 - 46.0 % 27.8(L) 28.7(L) 26.3(L)  Platelets 150 - 400 K/uL 63(L) 136(L) 186     CMP Latest Ref Rng & Units 01/09/2019 01/02/2019 12/29/2018  Glucose 70 - 99 mg/dL 113(H) 102(H) 116(H)  BUN 6 - 20 mg/dL 18 22(H) 16  Creatinine 0.44 - 1.00 mg/dL 1.09(H) 1.05(H) 1.00  Sodium 135 - 145 mmol/L 142 141 139  Potassium 3.5 - 5.1 mmol/L 3.9 3.7 3.4(L)  Chloride 98 - 111 mmol/L 106 105 104  CO2 22 - 32 mmol/L 29 27 28   Calcium 8.9 - 10.3 mg/dL 9.8 9.3 9.0  Total Protein 6.5 - 8.1 g/dL 7.5 7.2 6.9  Total Bilirubin 0.3 - 1.2 mg/dL 0.4 0.4 0.3  Alkaline Phos 38 - 126 U/L 290(H) 203(H) 214(H)  AST 15 - 41 U/L 84(H) 45(H) 43(H)  ALT 0 - 44 U/L 89(H) 48(H) 41      RADIOGRAPHIC STUDIES: I have personally reviewed the radiological images as listed and agreed with the findings in the report. Ir Cholangiogram Existing Tube  Result Date: 01/09/2019 INDICATION: Leaking around cholecystostomy catheter, placed 12/15/2018 for acute calculus cholecystitis. Above 50 mL per day of relatively clear bile into the drain bag. EXAM: CHOLANGIOGRAM THROUGH EXISTING CATHETER CHOLECYSTOSTOMY CATHETER EXCHANGE UNDER FLUOROSCOPY (ATTEMPTED)  MEDICATIONS: None indicated ANESTHESIA/SEDATION: Lidocaine 1% subcutaneous FLUOROSCOPY TIME:  21 minutes; 944  uGym2 DAP COMPLICATIONS: SIR Level A - No therapy, no consequence (immediate). PROCEDURE: Informed written consent was obtained from the patient after a thorough discussion of the procedural risks, benefits and alternatives. All questions were addressed. Initially, contrast was injected through the cholecystostomy catheter. The catheter and surrounding skin was then prepped and draped in usual sterile fashion. Maximal Sterile Barrier Technique was utilized including caps, mask, sterile gowns, sterile gloves, sterile drape, hand hygiene and skin antiseptic. A timeout was  performed prior to the initiation of the procedure. The catheter was cut. Because of the loop of the catheter in the peritoneal cavity, a Bentson wire was advanced carefully into the lumen of the gallbladder. Attempts were made to advance a 5 [place] Kum[place] the catheter into the lumen of the gallbladder however the catheter would not easily advanced around the extraluminal loop. A 5 French angled glide catheter similarly would not advance all the way to the lumen of the gallbladder. A 4 French straight glide catheter did follow around the [place] wire into the lumen of the gallbladder in the fundus . With attempts to gently advance the catheter guidewire combination more fully into the lumen of the gallbladder, the catheter buckled into the peritoneal cavity ultimately up into the left upper quadrant, the guidewire would not advance fully into the lumen of the gallbladder, and ultimately access to the gallbladder was lost. I spent a fair amount of fluoroscopy time with a variety of catheter and guidewire combinations in hopes of cannulating the residual tract through the gallbladder wall but ultimately this was unsuccessful. FINDINGS: Injection of gallbladder drain demonstrated that the catheter had pulled back and looped into the peritoneal  cavity such that the proximal sidehole was external to the gallbladder, the remainder of the pigtail catheter within the lumen. Patency of the cystic duct and CBD documented. With continued injection, free peritoneal spill was demonstrated. For this reason, attempts were made to exchange and reposition the catheter better into the lumen of the gallbladder. As detailed above, percutaneous access to the gallbladder lumen was lost. The percutaneous catheter was removed. Site was covered with a gauze dressing. I reviewed the situation over the phone with Dr. Donne Hazel who had previously consulted on the patient as inpatient. He was familiar with her case. We reviewed the fact that her cystic and common bile ducts are patent with resultant low bile output via the gallbladder. We felt that there is a fair chance the wall defect may close at the site of the 10 [place] drain. We felt that endoscopic stent placement or sphincterotomy at the sphincter of Oddi for enhanced biliary diversion would carry higher potential risk than theoretical benefit. Given that the patient remains currently asymptomatic, there was no indication for immediate hospitalization. I reviewed the situation with the patient who understood. She knows to telephone our service should she develop new abdominal pain, fever, or other new abdominal symptoms which could conceivably be related to bile peritonitis or cholecystitis. She has our daytime and off hours phone numbers. IMPRESSION: 1. Malposition of previously placed cholecystostomy catheter, proximal side hole external to the gallbladder lumen communicating with the peritoneal cavity. 2. Loss of access to the gallbladder lumen during attempted cholecystostomy catheter exchange and repositioning. See discussion above. Electronically Signed   By: Lucrezia Europe M.D.   On: 01/09/2019 16:59     ASSESSMENT & PLAN:  JEMEKA WAGLER is a 58 y.o. female with   1. 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 Treated with chemo.She was treated withfirst linechemoFOLFIRINOX with dose reduction. Due to neuropathyand cytopenia, chemo changed tosecond line 5-FU pump infusion and liposomal irinotecan every 2 weekswith dose reductiondue to cytopenia. -Unfortunately she recently had disease progression withworseningliver mets.  -She started second-line Gemcitabine and Abraxane2 weeks on/1 week offon 09/12/18. She tolerateswell with no significant side effects except hair lossand spiked fever after chemo  -Since recent hospitalization for acute cholecystitis, she has recovered well  and gaining weight. Her draining output is 55m daily, she denies dehydration.  -She has had another fever spike 2 days after last chemo (dose was reduced 20%) and leakage from her drain in the past few days. I am concerned chemo may be causing cholecystitis again. I will send her to IR for evaluation of her biliary leakage issueis -Will obtain [place] gallbladder this week if needed  -Labs reviewed, CBC and CMP WNL except WBC 1.7, Hg 8.7, PLT 63K, ANC 0.9, BG 113, Cr 1.09, AST 84, ALT 89, Alk Phos 290. CA 19.9 still pending. Will can cancel treatment today      2. Weight lossandmalnutrition -Due to chemo and underlying metastatic cancer. -Continue nutritional supplement and f/u with dietician. -Appetite and eating much improved.Weight continues to increase over 100lbs.   3. Transaminitis -Due to underlying chemo and malignancy -Have elevate to AST 84, ALT 89, Alk Phos 290 (01/09/19)  4. Pancytopenia, secondary to chemo -Required chemo dose reduction. -S/p blood transfusion as needed with gh <8. Last on 09/20/18 -WBC 1.7, PLT 63K, Hg at 8.7 (01/09/19)  5. Hypokalemia, CKD Stage III -Sheis currently on KCL supplements -Potassium normalized today, Cr at 1.09 (01/09/19)  6.Peripheral neuropathy, grade 2 -Currently on Neurontin 200 mg at night  and B complexes. Continue. -She agreed to use ice bags with Abraxane infusion, continue -stable  7. Acute cholecystitis, status post cholecystectomy tube placement 4/23per IR -she was seen by surgery during her hospitalization, surgery was not recommended due to high risk of complication, and concerns for cancer progression when holding chemotherapy for surgery -Drain intact, currently has decreased to 262mslightly bloody drainage per day -afebrile and no chills.  -Her CT CAP from 12/14/18 shows she has numerus gallstones. Will monitor for any inflammation.  -she only has mild discomfort at draining site. 2 days after last infusion of chemo she had another spike in fever which resolved spontaneously and then developed leakage from her drain with mild abdominal pain.  -She will see IR today about leakage.   8. LE DVT -Diagnosed on December 15, 2018, when she was hospitalized for cholecystitis -Due to her bloody biliary drainage, and moderate thrombocytopenia from chemotherapy, she is on low-dose Eliquis 1544maily (no loading dose), she is tolerating well and will continue    9.Goal of care discussion -She is full code now -She understands her cancer is incurable at this stage, and the goal of therapy is palliative, to prolong his life, and prevent cancer related symptoms      Plan -Labs reviewed, will cancel chemo today due to pancytopenia and possible gallbladder infection  -I will discuss with IR and surgery Dr. WakDonne Hazelout her gallbladder issue  -Lab, flush, f/u and Gem/Abraxane in 2 weeks    No problem-specific Assessment & Plan notes found for this encounter.   No orders of the defined types were placed in this encounter.  All questions were answered. The patient knows to call the clinic with any problems, questions or concerns. No barriers to learning was detected. I spent 20 minutes counseling the patient face to face. The total time spent in the appointment  was 25 minutes and more than 50% was on counseling and review of test results     YanTruitt MerleD 01/09/2019   I, AmoJoslyn Devonm acting as scribe for YanTruitt MerleD.   I have reviewed the above documentation for accuracy and completeness, and I agree with the above.

## 2019-01-09 ENCOUNTER — Ambulatory Visit (HOSPITAL_COMMUNITY)
Admission: RE | Admit: 2019-01-09 | Discharge: 2019-01-09 | Disposition: A | Discharge status: Home / Self Care | Payer: 59 | Source: Ambulatory Visit | Attending: Radiology | Admitting: Radiology

## 2019-01-09 ENCOUNTER — Telehealth: Payer: Self-pay | Admitting: Hematology

## 2019-01-09 ENCOUNTER — Inpatient Hospital Stay (HOSPITAL_BASED_OUTPATIENT_CLINIC_OR_DEPARTMENT_OTHER): Payer: 59 | Admitting: Hematology

## 2019-01-09 ENCOUNTER — Inpatient Hospital Stay: Payer: 59

## 2019-01-09 ENCOUNTER — Other Ambulatory Visit: Payer: Self-pay | Admitting: Radiology

## 2019-01-09 ENCOUNTER — Encounter (HOSPITAL_COMMUNITY): Payer: Self-pay | Admitting: Interventional Radiology

## 2019-01-09 ENCOUNTER — Other Ambulatory Visit: Payer: Self-pay

## 2019-01-09 VITALS — BP 120/75 | HR 75 | Temp 98.7°F | Resp 17 | Ht 65.0 in | Wt 106.7 lb

## 2019-01-09 DIAGNOSIS — Z95828 Presence of other vascular implants and grafts: Secondary | ICD-10-CM

## 2019-01-09 DIAGNOSIS — D61810 Antineoplastic chemotherapy induced pancytopenia: Secondary | ICD-10-CM | POA: Diagnosis not present

## 2019-01-09 DIAGNOSIS — R109 Unspecified abdominal pain: Secondary | ICD-10-CM | POA: Insufficient documentation

## 2019-01-09 DIAGNOSIS — K8020 Calculus of gallbladder without cholecystitis without obstruction: Secondary | ICD-10-CM | POA: Diagnosis not present

## 2019-01-09 DIAGNOSIS — E46 Unspecified protein-calorie malnutrition: Secondary | ICD-10-CM

## 2019-01-09 DIAGNOSIS — C787 Secondary malignant neoplasm of liver and intrahepatic bile duct: Secondary | ICD-10-CM

## 2019-01-09 DIAGNOSIS — C259 Malignant neoplasm of pancreas, unspecified: Secondary | ICD-10-CM | POA: Diagnosis not present

## 2019-01-09 DIAGNOSIS — E876 Hypokalemia: Secondary | ICD-10-CM

## 2019-01-09 DIAGNOSIS — I82402 Acute embolism and thrombosis of unspecified deep veins of left lower extremity: Secondary | ICD-10-CM

## 2019-01-09 DIAGNOSIS — Z5111 Encounter for antineoplastic chemotherapy: Secondary | ICD-10-CM | POA: Diagnosis not present

## 2019-01-09 DIAGNOSIS — R634 Abnormal weight loss: Secondary | ICD-10-CM

## 2019-01-09 DIAGNOSIS — C251 Malignant neoplasm of body of pancreas: Secondary | ICD-10-CM

## 2019-01-09 DIAGNOSIS — G620 Drug-induced polyneuropathy: Secondary | ICD-10-CM

## 2019-01-09 HISTORY — PX: IR CHOLANGIOGRAM EXISTING TUBE: IMG6040

## 2019-01-09 LAB — CBC WITH DIFFERENTIAL/PLATELET
Abs Immature Granulocytes: 0.01 10*3/uL (ref 0.00–0.07)
Basophils Absolute: 0 10*3/uL (ref 0.0–0.1)
Basophils Relative: 1 %
Eosinophils Absolute: 0 10*3/uL (ref 0.0–0.5)
Eosinophils Relative: 1 %
HCT: 27.8 % — ABNORMAL LOW (ref 36.0–46.0)
Hemoglobin: 8.7 g/dL — ABNORMAL LOW (ref 12.0–15.0)
Immature Granulocytes: 1 %
Lymphocytes Relative: 32 %
Lymphs Abs: 0.6 10*3/uL — ABNORMAL LOW (ref 0.7–4.0)
MCH: 33.7 pg (ref 26.0–34.0)
MCHC: 31.3 g/dL (ref 30.0–36.0)
MCV: 107.8 fL — ABNORMAL HIGH (ref 80.0–100.0)
Monocytes Absolute: 0.3 10*3/uL (ref 0.1–1.0)
Monocytes Relative: 16 %
Neutro Abs: 0.9 10*3/uL — ABNORMAL LOW (ref 1.7–7.7)
Neutrophils Relative %: 49 %
Platelets: 63 10*3/uL — ABNORMAL LOW (ref 150–400)
RBC: 2.58 MIL/uL — ABNORMAL LOW (ref 3.87–5.11)
RDW: 14.8 % (ref 11.5–15.5)
WBC: 1.7 10*3/uL — ABNORMAL LOW (ref 4.0–10.5)
nRBC: 0 % (ref 0.0–0.2)

## 2019-01-09 LAB — CMP (CANCER CENTER ONLY)
ALT: 89 U/L — ABNORMAL HIGH (ref 0–44)
AST: 84 U/L — ABNORMAL HIGH (ref 15–41)
Albumin: 3.6 g/dL (ref 3.5–5.0)
Alkaline Phosphatase: 290 U/L — ABNORMAL HIGH (ref 38–126)
Anion gap: 7 (ref 5–15)
BUN: 18 mg/dL (ref 6–20)
CO2: 29 mmol/L (ref 22–32)
Calcium: 9.8 mg/dL (ref 8.9–10.3)
Chloride: 106 mmol/L (ref 98–111)
Creatinine: 1.09 mg/dL — ABNORMAL HIGH (ref 0.44–1.00)
GFR, Est AFR Am: >60 mL/min (ref >60)
GFR, Estimated: 57 mL/min — ABNORMAL LOW (ref >60)
Glucose, Bld: 113 mg/dL — ABNORMAL HIGH (ref 70–99)
Potassium: 3.9 mmol/L (ref 3.5–5.1)
Sodium: 142 mmol/L (ref 135–145)
Total Bilirubin: 0.4 mg/dL (ref 0.3–1.2)
Total Protein: 7.5 g/dL (ref 6.5–8.1)

## 2019-01-09 MED ORDER — LIDOCAINE HCL 1 % IJ SOLN
INTRAMUSCULAR | Status: AC
  Filled 2019-01-09: qty 20

## 2019-01-09 MED ORDER — LIDOCAINE HCL 1 % IJ SOLN
INTRAMUSCULAR | Status: DC | PRN
  Administered 2019-01-09: 5 mL

## 2019-01-09 MED ORDER — IOHEXOL 300 MG/ML  SOLN
50.0000 mL | Freq: Once | INTRAMUSCULAR | Status: AC | PRN
  Administered 2019-01-09: 20 mL

## 2019-01-09 MED ORDER — SODIUM CHLORIDE 0.9% FLUSH
10.0000 mL | Freq: Once | INTRAVENOUS | Status: AC
  Administered 2019-01-09: 10 mL
  Filled 2019-01-09: qty 10

## 2019-01-09 MED ORDER — HEPARIN SOD (PORK) LOCK FLUSH 100 UNIT/ML IV SOLN
500.0000 [IU] | Freq: Once | INTRAVENOUS | Status: AC
  Administered 2019-01-09: 500 [IU] via INTRAVENOUS
  Filled 2019-01-09: qty 5

## 2019-01-09 NOTE — Telephone Encounter (Signed)
Scheduled appt per 5/18 los. ° °

## 2019-01-09 NOTE — Procedures (Signed)
  Procedure: Cholangiogram, Exchange of perc cholecystostomy catheter (attempted) loss of access to GB lumen EBL:   minimal Complications:  Complete dislodgement of catheter from GB of indeterminate clinical significance  See full dictation in [facility].  Dillard Cannon MD Main # [phone] Pager  [phone]

## 2019-01-10 ENCOUNTER — Telehealth: Payer: Self-pay | Admitting: Interventional Radiology

## 2019-01-10 ENCOUNTER — Encounter: Payer: Self-pay | Admitting: Hematology

## 2019-01-10 ENCOUNTER — Other Ambulatory Visit: Payer: Self-pay | Admitting: Hematology

## 2019-01-10 DIAGNOSIS — K819 Cholecystitis, unspecified: Secondary | ICD-10-CM

## 2019-01-10 LAB — CANCER ANTIGEN 19-9: CA 19-9: 35 U/mL (ref 0–35)

## 2019-01-10 MED ORDER — ELTROMBOPAG OLAMINE 25 MG PO TABS: 25.0000 mg | ORAL_TABLET | Freq: Every day | ORAL | 0 refills | Status: DC

## 2019-01-10 NOTE — Telephone Encounter (Signed)
Patient called this afternoon. She had some abd  pain last night. She used her tramadol that she has for back pain.  Pain is improved today, 3 out of 10. No fever, chills, drainage from tube site, nausea, vomiting. It may be due to peritoneal irritation from the tube removal and manipulations yesterday. I recommened NSAIDs eg Tylenol or advil to help with inflammation, and tramadol if she wants. Pain should not worsen. Pain should improve by end of week. If worsening pain or new symptoms come to ED for CT abd/pelv + contrast. She seemed to understand. She has IR daytime and offhours phone numbers and can call with questions. She was already scheduled to see Dr. Donne Hazel next week and should keep that appt.

## 2019-01-11 ENCOUNTER — Telehealth: Payer: Self-pay

## 2019-01-11 NOTE — Telephone Encounter (Signed)
-----   Message from [name], MD sent at 01/10/2019 10:50 PM EDT ----- Please let her know lab results. I have spoken with Dr. Donne Hazel after her visit to IR on Monday, will cancel abdominal US for now, and watch it clinically. Dr. Donne Hazel still does not recommend surgery at this point, and plan to continue chemo. I will work on a prescription of Promacta for her low plt issue. Thanks  [name]  01/10/2019

## 2019-01-11 NOTE — Telephone Encounter (Signed)
Spoke with patient regarding lab results.  Tumor marker normal.  Also Dr. Burr Medico spoke with Dr. Donne Hazel after her visit to IR on Monday.  Cancelling abdominal US for now and will observe.  Also Dr. Donne Hazel still does not recommend surgery at this point and plant to continue chemo.  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  Patient verbalized an understanding and knows about her appointment tomorrow with nuclear med.

## 2019-01-12 ENCOUNTER — Ambulatory Visit (HOSPITAL_COMMUNITY)
Admission: RE | Admit: 2019-01-12 | Discharge: 2019-01-12 | Disposition: A | Discharge status: Home / Self Care | Payer: 59 | Source: Ambulatory Visit | Attending: Hematology | Admitting: Hematology

## 2019-01-12 ENCOUNTER — Other Ambulatory Visit: Payer: Self-pay

## 2019-01-12 DIAGNOSIS — K819 Cholecystitis, unspecified: Secondary | ICD-10-CM

## 2019-01-12 MED ORDER — MORPHINE SULFATE (PF) 2 MG/ML IV SOLN
2.0000 mg | Freq: Once | INTRAVENOUS | Status: AC
  Administered 2019-01-12: 12:00:00 2 mg via INTRAVENOUS

## 2019-01-12 MED ORDER — MORPHINE SULFATE (PF) 4 MG/ML IV SOLN
INTRAVENOUS | Status: AC
  Filled 2019-01-12: qty 1

## 2019-01-12 MED ORDER — TECHNETIUM TC 99M MEBROFENIN IV KIT
5.4000 | PACK | Freq: Once | INTRAVENOUS | Status: AC | PRN
  Administered 2019-01-12: 11:00:00 5.4 via INTRAVENOUS

## 2019-01-13 ENCOUNTER — Telehealth: Payer: Self-pay

## 2019-01-13 NOTE — Telephone Encounter (Signed)
Thanks [employer]  I called her and she is doing well at home, mild abdominal discomfort, but better than earlier this week, no fever or chills, eating well. I told her to watch at home, and go to ED if she has fever or worsening abdominal pain.   Matt, please review her scan, let pt or me know if anything else needed over the weekend. She has a phone visit with you on Tuesday 5/26 at 4pm, she is wondering if she needs to see you in office.   Thanks   [name] MD

## 2019-01-13 NOTE — Telephone Encounter (Signed)
Patient called requesting results from Biliary scan 5/21. Would like a call back prior to start of 3 day weekend.

## 2019-01-17 ENCOUNTER — Telehealth: Payer: Self-pay

## 2019-01-17 ENCOUNTER — Other Ambulatory Visit: Payer: Self-pay | Admitting: Hematology

## 2019-01-17 ENCOUNTER — Telehealth: Payer: Self-pay | Admitting: Pharmacist

## 2019-01-17 DIAGNOSIS — D696 Thrombocytopenia, unspecified: Secondary | ICD-10-CM

## 2019-01-17 MED ORDER — ELTROMBOPAG OLAMINE 25 MG PO TABS: 25.0000 mg | ORAL_TABLET | Freq: Every day | ORAL | 0 refills | Status: DC

## 2019-01-17 NOTE — Telephone Encounter (Signed)
Oral Oncology Pharmacist Encounter  Promacta prescription has been e-scribed to [facility] specialty pharmacy per insurance requirement.  Supporting information including insurance information, demographic information, medication list, and insurance authorization has been faxed to dispensing pharmacy.   I will reach out to the patient to discuss initial counseling and site of dispensing once I confirm pharmacy is able to process patient's claim.  Johny Drilling, PharmD, BCPS, BCOP  01/17/2019 3:31 PM Oral Oncology Clinic [phone]

## 2019-01-17 NOTE — Telephone Encounter (Signed)
Oral Oncology Pharmacist Encounter  Received new prescription for Promacta (eltrombopag) for the treatment of chemotherapy-induced pancytopenia, planned duration until adequate blood count response or unacceptable toxicity.  Original diagnosis of metastatic pancreatic cancer in November 2018. Patient has received multiple lines of chemotherapy including FOLFIRINOX, fluorouracil and Onivyde, and gemcitabine plus Abraxane, all with dose reductions and dose delays due to cytopenias.  Patient is under evaluation to initiate Promacta for treatment of her chemotherapy-induced thrombocytopenia in an effort to continue chemotherapy treatment for her pancreatic cancer. Chemotherapy administration plan for 01/09/2019 was held due to cytopenias.  Labs from 01/09/19 assessed, OK for treatment initiation. SCr=1.09, est CrCl ~ 44 mL/min, likely under-estimated with wt=48.4kg No dose adjustment per manufacturer with reduced renal function  Current medication list in Epic reviewed, no DDIs with Promacta identified.  Prescription has been e-scribed to the [facility] for benefits analysis and approval by MD on 01/10/19.  Oral Oncology Clinic will continue to follow for insurance authorization, copayment issues, initial counseling and start date.  Johny Drilling, PharmD, BCPS, BCOP  01/17/2019 1:01 PM Oral Oncology Clinic [phone]

## 2019-01-17 NOTE — Telephone Encounter (Signed)
Oral Oncology Patient Advocate Encounter  Prior Authorization for [name] has been approved.    PA# [id number] Effective dates: 01/17/19 through 01/17/20  Oral Oncology Clinic will continue to follow.   [place] Patient Morgan Dawson Phone [phone] Fax [phone] 01/17/2019    3:17 PM

## 2019-01-17 NOTE — Telephone Encounter (Signed)
Oral Oncology Patient Advocate Encounter  Received notification from [facility] that prior authorization for Promacta is required.  PA submitted on CoverMyMeds Key [id number] Status is pending  Oral Oncology Clinic will continue to follow.  [place] Patient [place] Phone [phone] Fax [phone] 01/17/2019    1:44 PM

## 2019-01-18 ENCOUNTER — Telehealth: Payer: Self-pay | Admitting: Hematology

## 2019-01-18 NOTE — Telephone Encounter (Signed)
Oral Chemotherapy Pharmacist Encounter   I spoke with patient for overview of: Promacta (eltrombopag) for the treatment of chemotherapy-induced pancytopenia, planned duration until adequate blood count response or unacceptable toxicity.   Counseled patient on administration, dosing, side effects, monitoring, drug-food interactions, safe handling, storage, and disposal.  Patient will take Promacta 25mg  tablets, 1 tablet by mouth once daily on an empty stomach, 1 hour before or 2 hours after a meal.  Patient counseled to administer Promacta at least 2 hours before, or 4 hours after antacids, foods high in calcium, or vitamin supplements (iron, calcium, aluminum, magnesium, selinium, zinc).  Patient has a diet high in dairy products, we discussed strategies to avoid close administration.  Promacta start date: TBD, pending medication acquisition  Adverse effects include but are not limited to: fatigue, diarrhea, nausea, pruritis, decreased blood counts, hepatotoxicity, flu-like symptoms, myalgias, fever, and peripheral edema.    Patient states she already experiences a fever for ~ 12h after her chemotherapy treatments. She will monitor for a change in this pattern.  Reviewed with patient importance of keeping a medication schedule and plan for any missed doses.  Medication reconciliation performed and medication/allergy list updated.  Insurance authorization for Morgan Dawson has been obtained. Promacta prescription must be filled at [facility] specialty pharmacy per insurance requirement. Prescription and supporting information was sent to dispensing pharmacy yesterday, 01/17/19. Patient was informed dispensing pharmacy would be contacting her with copayment information and to schedule delivery of 1st fill of Promacta.  All questions answered.  Morgan Dawson voiced understanding and appreciation.   Patient knows to call the office with questions or concerns.  Johny Drilling, PharmD, BCPS,  BCOP  01/18/2019   2:00 PM Oral Oncology Clinic [phone]

## 2019-01-18 NOTE — Telephone Encounter (Signed)
Oral Oncology Patient Advocate Encounter  I followed up with [facility] on the promacta prescription. The Promacta has been processed with a $0 copay, it is currently in pharmacist review and then the patient will be called to schedule shipment. The representative put in an expedited request in hopes that the patient will be contacted today to schedule shipment.  [place] Patient [place] Phone [phone] Fax [phone] 01/18/2019   3:12 PM

## 2019-01-18 NOTE — Telephone Encounter (Signed)
Oral Chemotherapy Pharmacist Encounter   Attempted to reach patient to provide update and offer for initial counseling on oral medication: Promacta.  No answer.  Left voicemail for patient to call back to discuss details of medication acquisition and initial counseling session.  Johny Drilling, PharmD, BCPS, BCOP  01/18/2019   12:51 PM Oral Oncology Clinic [phone]

## 2019-01-18 NOTE — Telephone Encounter (Signed)
LB off 6/1 in the AM. Appointments moved to PM. Left message for patient. Schedule mailed.

## 2019-01-23 ENCOUNTER — Ambulatory Visit: Payer: 59

## 2019-01-23 ENCOUNTER — Inpatient Hospital Stay (HOSPITAL_BASED_OUTPATIENT_CLINIC_OR_DEPARTMENT_OTHER): Payer: 59 | Admitting: Nurse Practitioner

## 2019-01-23 ENCOUNTER — Other Ambulatory Visit: Payer: 59

## 2019-01-23 ENCOUNTER — Inpatient Hospital Stay: Payer: 59

## 2019-01-23 ENCOUNTER — Encounter: Payer: Self-pay | Admitting: Nurse Practitioner

## 2019-01-23 ENCOUNTER — Ambulatory Visit: Payer: 59 | Admitting: Nurse Practitioner

## 2019-01-23 ENCOUNTER — Other Ambulatory Visit: Payer: Self-pay

## 2019-01-23 ENCOUNTER — Inpatient Hospital Stay: Payer: 59 | Attending: Hematology

## 2019-01-23 VITALS — BP 118/75 | HR 79 | Temp 98.8°F | Resp 18 | Ht 65.0 in | Wt 107.0 lb

## 2019-01-23 DIAGNOSIS — I82409 Acute embolism and thrombosis of unspecified deep veins of unspecified lower extremity: Secondary | ICD-10-CM | POA: Diagnosis not present

## 2019-01-23 DIAGNOSIS — D696 Thrombocytopenia, unspecified: Secondary | ICD-10-CM | POA: Insufficient documentation

## 2019-01-23 DIAGNOSIS — C251 Malignant neoplasm of body of pancreas: Secondary | ICD-10-CM

## 2019-01-23 DIAGNOSIS — K8043 Calculus of bile duct with acute cholecystitis with obstruction: Secondary | ICD-10-CM | POA: Insufficient documentation

## 2019-01-23 DIAGNOSIS — N183 Chronic kidney disease, stage 3 (moderate): Secondary | ICD-10-CM | POA: Diagnosis not present

## 2019-01-23 DIAGNOSIS — C259 Malignant neoplasm of pancreas, unspecified: Secondary | ICD-10-CM | POA: Diagnosis present

## 2019-01-23 DIAGNOSIS — E876 Hypokalemia: Secondary | ICD-10-CM | POA: Insufficient documentation

## 2019-01-23 DIAGNOSIS — Z7901 Long term (current) use of anticoagulants: Secondary | ICD-10-CM | POA: Diagnosis not present

## 2019-01-23 DIAGNOSIS — C787 Secondary malignant neoplasm of liver and intrahepatic bile duct: Secondary | ICD-10-CM | POA: Insufficient documentation

## 2019-01-23 DIAGNOSIS — I82402 Acute embolism and thrombosis of unspecified deep veins of left lower extremity: Secondary | ICD-10-CM

## 2019-01-23 DIAGNOSIS — D6481 Anemia due to antineoplastic chemotherapy: Secondary | ICD-10-CM | POA: Diagnosis not present

## 2019-01-23 DIAGNOSIS — D61810 Antineoplastic chemotherapy induced pancytopenia: Secondary | ICD-10-CM | POA: Diagnosis not present

## 2019-01-23 DIAGNOSIS — G620 Drug-induced polyneuropathy: Secondary | ICD-10-CM

## 2019-01-23 DIAGNOSIS — Z5111 Encounter for antineoplastic chemotherapy: Secondary | ICD-10-CM | POA: Insufficient documentation

## 2019-01-23 DIAGNOSIS — R509 Fever, unspecified: Secondary | ICD-10-CM | POA: Insufficient documentation

## 2019-01-23 DIAGNOSIS — Z9049 Acquired absence of other specified parts of digestive tract: Secondary | ICD-10-CM | POA: Diagnosis not present

## 2019-01-23 DIAGNOSIS — R740 Nonspecific elevation of levels of transaminase and lactic acid dehydrogenase [LDH]: Secondary | ICD-10-CM | POA: Insufficient documentation

## 2019-01-23 DIAGNOSIS — Z7189 Other specified counseling: Secondary | ICD-10-CM

## 2019-01-23 DIAGNOSIS — E46 Unspecified protein-calorie malnutrition: Secondary | ICD-10-CM

## 2019-01-23 DIAGNOSIS — Z79899 Other long term (current) drug therapy: Secondary | ICD-10-CM

## 2019-01-23 DIAGNOSIS — R634 Abnormal weight loss: Secondary | ICD-10-CM

## 2019-01-23 DIAGNOSIS — Z95828 Presence of other vascular implants and grafts: Secondary | ICD-10-CM

## 2019-01-23 LAB — COMPREHENSIVE METABOLIC PANEL
ALT: 39 U/L (ref 0–44)
AST: 40 U/L (ref 15–41)
Albumin: 3.6 g/dL (ref 3.5–5.0)
Alkaline Phosphatase: 240 U/L — ABNORMAL HIGH (ref 38–126)
Anion gap: 8 (ref 5–15)
BUN: 24 mg/dL — ABNORMAL HIGH (ref 6–20)
CO2: 28 mmol/L (ref 22–32)
Calcium: 9.1 mg/dL (ref 8.9–10.3)
Chloride: 102 mmol/L (ref 98–111)
Creatinine, Ser: 1.05 mg/dL — ABNORMAL HIGH (ref 0.44–1.00)
GFR calc Af Amer: >60 mL/min (ref >60)
GFR calc non Af Amer: 59 mL/min — ABNORMAL LOW (ref >60)
Glucose, Bld: 103 mg/dL — ABNORMAL HIGH (ref 70–99)
Potassium: 4.2 mmol/L (ref 3.5–5.1)
Sodium: 138 mmol/L (ref 135–145)
Total Bilirubin: 0.4 mg/dL (ref 0.3–1.2)
Total Protein: 7.9 g/dL (ref 6.5–8.1)

## 2019-01-23 LAB — CBC WITH DIFFERENTIAL/PLATELET
Abs Immature Granulocytes: 0.01 10*3/uL (ref 0.00–0.07)
Basophils Absolute: 0 10*3/uL (ref 0.0–0.1)
Basophils Relative: 1 %
Eosinophils Absolute: 0.1 10*3/uL (ref 0.0–0.5)
Eosinophils Relative: 1 %
HCT: 31 % — ABNORMAL LOW (ref 36.0–46.0)
Hemoglobin: 10 g/dL — ABNORMAL LOW (ref 12.0–15.0)
Immature Granulocytes: 0 %
Lymphocytes Relative: 12 %
Lymphs Abs: 0.7 10*3/uL (ref 0.7–4.0)
MCH: 32.9 pg (ref 26.0–34.0)
MCHC: 32.3 g/dL (ref 30.0–36.0)
MCV: 102 fL — ABNORMAL HIGH (ref 80.0–100.0)
Monocytes Absolute: 0.5 10*3/uL (ref 0.1–1.0)
Monocytes Relative: 9 %
Neutro Abs: 4.4 10*3/uL (ref 1.7–7.7)
Neutrophils Relative %: 77 %
Platelets: 180 10*3/uL (ref 150–400)
RBC: 3.04 MIL/uL — ABNORMAL LOW (ref 3.87–5.11)
RDW: 13.8 % (ref 11.5–15.5)
WBC: 5.7 10*3/uL (ref 4.0–10.5)
nRBC: 0 % (ref 0.0–0.2)

## 2019-01-23 MED ORDER — RIVAROXABAN 15 MG PO TABS: 15.0000 mg | ORAL_TABLET | Freq: Every day | ORAL | 0 refills | Status: DC

## 2019-01-23 MED ORDER — SODIUM CHLORIDE 0.9 % IV SOLN
600.0000 mg/m2 | Freq: Once | INTRAVENOUS | Status: AC
  Administered 2019-01-23: 874 mg via INTRAVENOUS
  Filled 2019-01-23: qty 22.99

## 2019-01-23 MED ORDER — SODIUM CHLORIDE 0.9% FLUSH
10.0000 mL | INTRAVENOUS | Status: DC | PRN
  Administered 2019-01-23: 10 mL
  Filled 2019-01-23: qty 10

## 2019-01-23 MED ORDER — PROCHLORPERAZINE MALEATE 10 MG PO TABS
ORAL_TABLET | ORAL | Status: AC
  Filled 2019-01-23: qty 1

## 2019-01-23 MED ORDER — SODIUM CHLORIDE 0.9% FLUSH
10.0000 mL | Freq: Once | INTRAVENOUS | Status: AC
  Administered 2019-01-23: 10 mL
  Filled 2019-01-23: qty 10

## 2019-01-23 MED ORDER — PROCHLORPERAZINE MALEATE 10 MG PO TABS
10.0000 mg | ORAL_TABLET | Freq: Once | ORAL | Status: AC
  Administered 2019-01-23: 10 mg via ORAL

## 2019-01-23 MED ORDER — PACLITAXEL PROTEIN-BOUND CHEMO INJECTION 100 MG
80.0000 mg/m2 | Freq: Once | INTRAVENOUS | Status: AC
  Administered 2019-01-23: 125 mg via INTRAVENOUS
  Filled 2019-01-23: qty 25

## 2019-01-23 MED ORDER — PACLITAXEL PROTEIN-BOUND CHEMO INJECTION 100 MG
100.0000 mg/m2 | Freq: Once | INTRAVENOUS | Status: DC
  Filled 2019-01-23: qty 30

## 2019-01-23 MED ORDER — HEPARIN SOD (PORK) LOCK FLUSH 100 UNIT/ML IV SOLN
500.0000 [IU] | Freq: Once | INTRAVENOUS | Status: AC | PRN
  Administered 2019-01-23: 18:00:00 500 [IU]
  Filled 2019-01-23: qty 5

## 2019-01-23 MED ORDER — SODIUM CHLORIDE 0.9 % IV SOLN
Freq: Once | INTRAVENOUS | Status: AC
  Administered 2019-01-23: 16:00:00 via INTRAVENOUS
  Filled 2019-01-23: qty 250

## 2019-01-23 NOTE — Progress Notes (Signed)
Received call from Dr. Burr Medico: reduce Abraxane dose basis to 80 mg/m2. Pt has not started on Promacta.   Kennith Center, Pharm.D., CPP 01/23/2019@4 :27 PM

## 2019-01-23 NOTE — Progress Notes (Signed)
[place]   [phone] [phone]   Clinic Follow up Note   Patient Care Team: [name], MD as PCP - General (Family Medicine) [name], MD as Consulting Physician (Otolaryngology) [name], MD as Consulting Physician (Oncology)  Date of service: 01/23/2019  CHIEF COMPLAINT: f/u pancreatic cancer    SUMMARY OF ONCOLOGIC HISTORY: Oncology History   Cancer Staging Pancreatic cancer [facility]) Staging form: Exocrine Pancreas, AJCC 8th Edition - Clinical stage from 08/06/2017: Stage IV (cTX, cN0, pM1) - Signed by [name], MD on 08/12/2017       Pancreatic cancer ([place])   07/23/2017 Imaging    US abdomen limited RUQ 07/23/17 IMPRESSION: 1. Cholelithiasis.  No secondary signs of acute cholecystitis. 2. Multiple solid liver masses measuring up to 6.5 cm in the left lobe of the liver, evaluation for metastatic disease is recommended. These results will be called to the ordering clinician or representative by the Radiologist Assistant, and communication documented in the PACS or zVision Dashboard.    07/23/2017 Imaging    CT Abdomen W Contrast 07/23/17 IMPRESSION: 1. Widespread metastatic disease throughout the liver. No clear primary malignancy identified in the abdomen. The pelvis was not imaged. Tissue sampling recommended. 2. Probable adenopathy superior to the pancreatic tail. No evidence of pancreatic mass. 3. Suspected incidental hemangioma inferiorly in the right hepatic lobe. 4. Nonspecific nodularity in the breasts. The patient has undergone recent (03/25/2017 and 04/01/2017) mammography and ultrasound.    07/29/2017 Initial Diagnosis    Metastasis to liver of unknown origin ([place])    07/29/2017 PET scan    PET 07/29/17  IMPRESSION: 1. Numerous bulky liver masses are hypermetabolic compatible with malignancy. 2. Accentuated activity within or along the pancreatic tail, likely represent a primary pancreatic tumor.  Consider pancreatic protocol MRI to further work this up. 3. The peripancreatic lymph node shown above the pancreatic tail is mildly hypermetabolic favoring malignancy. 4.  Prominent stool throughout the colon favors constipation. 5. 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    08/05/2017 Pathology Results    Liver Biopsy  Diagnosis 08/05/17 Liver, needle/core biopsy - CARCINOMA. - SEE COMMENT. Microscopic Comment The malignant cells are positive for cytokeratin 7. They are negative for arginase, CDX2, cytokeratin 20, estrogen receptor, GATA-3, GCDFP, Glypican 3, Hep Par 1, Napsin A, and TTF-1. This immunohistochemical is nonspecific. Possibly primary sources include pancreatobiliary and upper gastrointestinal. Radiologic correlation is necessary. Of note, organ specific markers (GATA-3, GCDFP-breast, TTF-1, Napsin A-lung, and CDX2-colon) are negative. (JBK:ecj 08/09/2017)    08/21/2017 - 02/10/2018 Chemotherapy    FOLFIRINOX every 2 weeks starting 08/21/17. Dose reduced due to neuropahty and cytopenia     10/14/2017 Imaging    CT CAP WO Contrast 10/14/17 IMPRESSION: Evidence of known numerous liver metastases without significant interval change. No other evidence of metastatic disease within the chest, abdomen or pelvis. Cholelithiasis. Tiny pericardial effusion.    12/02/2017 Genetic Testing    Negative for pathogenic mutation.  The genes analyzed were the 83 genes on [facility]'s Multi-Cancer panel (ALK, APC, ATM, AXIN2, BAP1, BARD1, BLM, BMPR1A, BRCA1, BRCA2, BRIP1, CASR, CDC73, CDH1, CDK4, CDKN1B, CDKN1C, CDKN2A, CEBPA, CHEK2, CTNNA1, DICER1, DIS3L2, EGFR, EPCAM, FH, FLCN, GATA2, GPC3, GREM1, HOXB13, HRAS, KIT, MAX, MEN1, MET, MITF, MLH1, MSH2, MSH3, MSH6, MUTYH, NBN, NF1, NF2, NTHL1, PALB2, PDGFRA, PHOX2B, PMS2, POLD1, POLE, POT1, PRKAR1A, PTCH1, PTEN, RAD50, RAD51C, RAD51D, RB1, RECQL4, RET, RUNX1, SDHA, SDHAF2, SDHB, SDHC, SDHD, SMAD4, SMARCA4, SMARCB1, SMARCE1, STK11, SUFU, TERC,  TERT, TMEM127, TP53, TSC1,  TSC2, VHL, WRN, WT1).    12/17/2017 PET scan    IMPRESSION: 1. Although the liver metastases are still visible on the CT images, they are significantly smaller and retain no abnormal metabolic activity, consistent with response to therapy. 2. No abnormal activity within the pancreas. 3. No disease progression identified. 4. Cholelithiasis.    02/21/2018 PET scan    02/21/2018 PET Scan  IMPRESSION: 1. There are 2 new small nodules identified within the right lung which measure up to 5 mm. These are too small to reliably characterize by PET-CT, but warrant close interval follow-up. 2. Again noted are multifocal liver metastasis. The target lesion within the left lobe is slightly decreased in size when compared with the previous exam. Similar to previous exam there is no abnormal hypermetabolism above background liver activity identified within the liver lesions. 3. Gallstones.    02/22/2018 - 08/25/2018 Chemotherapy    5-FU pump infusion and liposomal irinotecan, every 2 weeks.  First cycle dose reduced due to cytopenia in the travel. Stopped on 08/25/2018 due to disease progression.     05/17/2018 Imaging    05/17/2018 PET Scan IMPRESSION: 1. Mixed response. The 2 small right lung nodules have decreased in size in the interval. Single focus of increased uptake within the liver is new from 02/21/2018 and concerning for recurrent metabolically active liver metastasis. 2. Splenomegaly.  New from previous exam. 3. Diffusely increased bone marrow activity, likely reflecting treatment related changes.    09/01/2018 PET scan    PET 09/01/18 IMPRESSION: 1. Unfortunately there recurrence of hepatic metastasis with multiple new hypermetabolic lesions in LEFT and RIGHT hepatic lobe. 2. New extra hepatic site of malignancy which appears associated the mid pancreas. Difficult to define lesion on noncontrast exam. 3. Diffuse marrow activity is favored benign. 4. No evidence  of pulmonary metastasis.    09/12/2018 -  Chemotherapy    second-line Gemcitabine and Abraxane for 2 weeks on, 1 week off starting 09/12/2018. Due to neuropathy, I will start her with dose reduced Abraxane.     12/14/2018 Imaging    CT CAP  IMPRESSION: CT demonstrates acute cholecystitis, with choledocholithiasis of the cystic duct.  These results were discussed by telephone at the time of interpretation on 12/14/2018 at 5:05 pm with Dr. Burr Medico.  Redemonstration of known liver metastases, with positive response to therapy, interval reduction in size of all lesions, with multiple demonstrating significant internal necrosis.  No acute finding of the chest.  Ancillary findings as above.     Pancreatic cancer metastasized to liver ([place])   08/11/2017 Initial Diagnosis    Pancreatic cancer metastasized to liver ([place])    09/12/2018 -  Chemotherapy    second-line Gemcitabine and Abraxane for 2 weeks on, 1 week off starting 09/12/2018. Due to neuropathy, I will start her with dose reduced Abraxane.      CURRENT THERAPY: Second-line Gemcitabine and Abraxane for 2 weeks on, 1 week off starting 09/12/2018. Cycle 5 postponed due to acute cholecystitis and only completed week 1.   INTERVAL HISTORY: Ms. Chamber returns for follow up and treatment as scheduled. S/p cycle 6 day 1 gemcitabine and abraxane on 01/02/19. She developed fever spikes and drainage leak after chemo, treatment was held on 5/18 for possible infection and pancytopenia. Today she feels well. No more fatigue than usual. Denies recent fever or abdominal pain. Eating and drinking well. No n/v. Bowel habits at baseline. Neuropathy is slightly worse in hands than feet. She has difficulty with necklace clasp and  small earrings, otherwise functions well. Denies cough, chest pain, or dyspnea.   MEDICAL HISTORY:  Past Medical History:  Diagnosis Date  . Diarrhea of presumed infectious origin 01/12/2018  . Gastroenteritis 01/12/2018  .  Genetic testing 12/02/2017   Multi-Cancer panel (83 genes) @ [facility] - No pathogenic mutations detected  . Meniere disease 2016  . Pancreatitis     SURGICAL HISTORY: Past Surgical History:  Procedure Laterality Date  . CERVICAL ABLATION    . ENDOMETRIAL ABLATION  2011  . IR CHOLANGIOGRAM EXISTING TUBE  01/09/2019  . IR FLUORO GUIDE PORT INSERTION RIGHT  08/12/2017  . IR PERC CHOLECYSTOSTOMY  12/15/2018  . IR US GUIDE VASC ACCESS RIGHT  08/12/2017  . [place]    I have reviewed the social history and family history with the patient and they are unchanged from previous note.  ALLERGIES:  has No Known Allergies.  MEDICATIONS:  Current Outpatient Medications  Medication Sig Dispense Refill  . b complex vitamins tablet Take 1 tablet by mouth daily.    Marland Kitchen eltrombopag (PROMACTA) 25 MG tablet Take 1 tablet (25 mg total) by mouth daily. Take on an empty stomach, 1 hour before a meal or 2 hours after. 30 tablet 0  . gabapentin (NEURONTIN) 100 MG capsule TAKE ONE CAPSULE BY MOUTH THREE TIMES DAILY 270 capsule 1  . loperamide (IMODIUM) 2 MG capsule Take 1 capsule (2 mg total) by mouth as needed for diarrhea or loose stools. 30 capsule 0  . loratadine (CLARITIN) 10 MG tablet Take 10 mg by mouth daily as needed for allergies.    . magic mouthwash w/lidocaine SOLN Take 10 mLs by mouth 3 (three) times daily as needed for mouth pain. Swish and spit 10 ML by mouth 3 times daily as needed for mouth pain. 240 mL 0  . meclizine (ANTIVERT) 25 MG tablet Take 25 mg by mouth 3 (three) times daily as needed for dizziness.    . ondansetron (ZOFRAN-ODT) 8 MG disintegrating tablet TAKE 1 TABLET BY MOUTH EVERY 8 HOURS AS NEEDED FOR NAUSE OR VOMITING (Patient taking differently: Take 8 mg by mouth every 8 (eight) hours as needed for nausea or vomiting. ) 40 tablet 0  . potassium chloride (K-DUR) 10 MEQ tablet Take 1 tablet (10 mEq total) by mouth 4 (four) times daily. 360 tablet 2  . prochlorperazine  (COMPAZINE) 10 MG tablet Take 1 tablet (10 mg total) by mouth every 6 (six) hours as needed for nausea or vomiting. 40 tablet 2  . Sodium Chloride Flush (NORMAL SALINE FLUSH) 0.9 % SOLN Flush cholecystomy tube  daily with 100 ml saline 50 Syringe 1  . traMADol (ULTRAM) 50 MG tablet Take 1 tablet (50 mg total) by mouth every 6 (six) hours as needed for moderate pain or severe pain. 30 tablet 0  . Rivaroxaban (XARELTO) 15 MG TABS tablet Take 1 tablet (15 mg total) by mouth daily with supper for 30 days. 30 tablet 0   No current facility-administered medications for this visit.    Facility-Administered Medications Ordered in Other Visits  Medication Dose Route Frequency Provider Last Rate Last Dose  . sodium chloride flush (NS) 0.9 % injection 10 mL  10 mL Intracatheter Once [name], MD        PHYSICAL EXAMINATION: ECOG PERFORMANCE STATUS: 1 - Symptomatic but completely ambulatory  Vitals:   01/23/19 1434  BP: 118/75  Pulse: 79  Resp: 18  Temp: 98.8 F (37.1 C)  SpO2: 100%  Filed Weights   01/23/19 1434  Weight: 107 lb (48.5 kg)    GENERAL:alert, no distress and comfortable SKIN: dry skin. No obvious rash EYES:  sclera clear LUNGS: respirations even and unlabored  HEART:  no lower extremity edema ABDOMEN:abdomen non-distended. Healed perc tube site  Musculoskeletal:no cyanosis of digits  NEURO: alert & oriented x 3 with fluent speech. Mildly decreased peripheral vibratory sense per tuning fork exam PAC without erythema  Limited exam for covid19 outbreak   LABORATORY DATA:  I have reviewed the data as listed CBC Latest Ref Rng & Units 01/23/2019 01/09/2019 01/02/2019  WBC 4.0 - 10.5 K/uL 5.7 1.7(L) 3.6(L)  Hemoglobin 12.0 - 15.0 g/dL 10.0(L) 8.7(L) 9.1(L)  Hematocrit 36.0 - 46.0 % 31.0(L) 27.8(L) 28.7(L)  Platelets 150 - 400 K/uL 180 63(L) 136(L)     CMP Latest Ref Rng & Units 01/23/2019 01/09/2019 01/02/2019  Glucose 70 - 99 mg/dL 103(H) 113(H) 102(H)  BUN 6 - 20 mg/dL  24(H) 18 22(H)  Creatinine 0.44 - 1.00 mg/dL 1.05(H) 1.09(H) 1.05(H)  Sodium 135 - 145 mmol/L 138 142 141  Potassium 3.5 - 5.1 mmol/L 4.2 3.9 3.7  Chloride 98 - 111 mmol/L 102 106 105  CO2 22 - 32 mmol/L 28 29 27   Calcium 8.9 - 10.3 mg/dL 9.1 9.8 9.3  Total Protein 6.5 - 8.1 g/dL 7.9 7.5 7.2  Total Bilirubin 0.3 - 1.2 mg/dL 0.4 0.4 0.4  Alkaline Phos 38 - 126 U/L 240(H) 290(H) 203(H)  AST 15 - 41 U/L 40 84(H) 45(H)  ALT 0 - 44 U/L 39 89(H) 48(H)      RADIOGRAPHIC STUDIES: I have personally reviewed the radiological images as listed and agreed with the findings in the report. No results found.   ASSESSMENT & PLAN: Morgan Dawson is a 58 y.o. female with   1. 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 She was treated withfirst linechemoFOLFIRINOX with dose reduction. Due to neuropathyand cytopenia, chemo changed tosecond line 5-FU pump infusion and liposomal irinotecan every 2 weekswith dose reductiondue to cytopenia. -Unfortunately she had disease progression withworseningliver mets.  -She started second-line Gemcitabine and Abraxane2 weeks on/1 week offon 09/12/18. She tolerateswell with no significant side effects except hair lossand spiked fever after chemo  -she developed acute cholecystitis and was hospitalized, s/p perc chole drain; she recovered well and resumed chemotherapy. After cycle 6 day 1 (01/02/19) she developed recurrent fever spikes and pancytopenia. Treatment was held for possible recurrent infection on 5/18. -Drain exchange was attempted in IR on 5/18 but drain fell out. Her case was discussed with IR and surgery. She had virtual visit with Dr. Donne Hazel last week. Surgery was not felt to be best option given it would require chemo delay.  -she is at high risk for recurrent infection after resuming chemotherapy; we reviewed signs of recurrent acute cholecystitis, she is currently asymptomatic  and doing well. -labs reviewed, counts have recovered well. CMP stable, AST/ALT now normal. Last CA 19-9 normal at 35 -She will proceed with cycle 7 day 1 with further dose reduction for pancytopenia. She has not received or started promacta yet, I reached out to pharmacy about this.  -Discussed with Dr. Burr Medico and pharmacy, will reduce gemcitabine to 600 mg/m2 and abraxane to 80 mg/m2.  -Return in 1 week for f/u and day 8  2. Weight lossandmalnutrition -Due to chemo and underlying metastatic cancer. -weight is stable lately, she uses supplements   3. Transaminitis -Due  to underlying chemo and malignancy -ALT/AST normal today, alk phos improved   4. Pancytopenia, secondary to chemo -cycle 6 day 8 chemo was held on 5/18, counts recovered well today -proceed with chemo today, cycle 7 day 1, with further dose reductions  -we reviewed s/sx of recurrent infection  5. Hypokalemia, CKD Stage III -Sheis currently on KCL supplements -stable  6.Peripheral neuropathy, grade 2 -Currently on Neurontin 200 mg at night and B complexes. Continue. -neuropathy has increased slightly in her hands, she has some difficulty with manipulating small jewelry, but otherwise functions well -monitoring closely   7.Acute cholecystitis, status post cholecystectomy tube placement 4/23per IR -she was seen by surgery during herhospitalization, surgery was not recommended due to high risk of complication, and concerns for cancer progression when holding chemotherapy for surgery -Drain was placed during hospitalization, with slightlybloody drainage daily  -Her CT CAP from 12/14/18 shows she has numerus gallstones. -imaging in 12/2018 consistent with cholecystitis with cystic duct obstruction. No leak on HIDA. Holding intervention for now given she is asymptomatic and invasive procedure would delay chemo.  -continue monitoring   8. LE DVT -Diagnosed on December 15, 2018, when she was hospitalized for  cholecystitis -Due to her bloody biliary drainage, and moderate thrombocytopenia from chemotherapy, she is on low-dose xarelto21m daily (no loading dose) -tolerating well, no bleeding -platelet count is adequate to continue -I refilled for her today  9.Goal of care discussion -She is full code now -treatment goal is palliative   Plan -Proceed with cycle 7 day 1 chemotherapy, dose-reduced gemcitabine to 600 mg/m2, abraxane to 80 mg/m2 per Dr. FBurr Medico-Patient to begin promacta when she receives shipment -Refilled xarelto  -Reviewed s/s of recurrent infection/cholecystitis  -F/u in 1 week with day 8 chemo  All questions were answered. The patient knows to call the clinic with any problems, questions or concerns. No barriers to learning was detected.    LAlla Feeling NP 01/24/19

## 2019-01-23 NOTE — Patient Instructions (Signed)
[facility] Discharge Instructions for Patients Receiving Chemotherapy  Today you received the following chemotherapy agents:  Gemzar (gemcitabine), Abraxane (protein bound paclitaxel)  To help prevent nausea and vomiting after your treatment, we encourage you to take your nausea medication as prescribed.   If you develop nausea and vomiting that is not controlled by your nausea medication, call the clinic.   BELOW ARE SYMPTOMS THAT SHOULD BE REPORTED IMMEDIATELY:  *FEVER GREATER THAN 100.5 F  *CHILLS WITH OR WITHOUT FEVER  NAUSEA AND VOMITING THAT IS NOT CONTROLLED WITH YOUR NAUSEA MEDICATION  *UNUSUAL SHORTNESS OF BREATH  *UNUSUAL BRUISING OR BLEEDING  TENDERNESS IN MOUTH AND THROAT WITH OR WITHOUT PRESENCE OF ULCERS  *URINARY PROBLEMS  *BOWEL PROBLEMS  UNUSUAL RASH Items with * indicate a potential emergency and should be followed up as soon as possible.  Feel free to call the clinic should you have any questions or concerns. The clinic phone number is [phone].  Please show the [place] at check-in to the Emergency Department and triage nurse.

## 2019-01-23 NOTE — Progress Notes (Signed)
[place] charge nurse asked me to Redraw labs for patient due to lab saying that previous labs clotted. I redrew patients CMP and walked it over to lab.

## 2019-01-24 ENCOUNTER — Other Ambulatory Visit: Payer: Self-pay | Admitting: Pharmacist

## 2019-01-24 ENCOUNTER — Encounter: Payer: Self-pay | Admitting: Nurse Practitioner

## 2019-01-24 ENCOUNTER — Telehealth: Payer: Self-pay | Admitting: Nurse Practitioner

## 2019-01-24 NOTE — Telephone Encounter (Signed)
Scheduled appt per 6/1 los. 

## 2019-01-24 NOTE — Telephone Encounter (Signed)
Oral Oncology Patient Advocate Encounter  I received a message that the patient has still not heard from [facility] to schedule her shipment.  I called [facility] and they have attempted to call the patient 3 times and left messages. She will need to return the call at [phone] to schedule her shipment, that copay is $0.  I called the patient to give her this information and left a detailed voicemail.  [facility] Patient [facility] Phone [phone] Fax [phone] 01/24/2019   10:11 AM

## 2019-01-27 ENCOUNTER — Ambulatory Visit (HOSPITAL_COMMUNITY)
Admission: RE | Admit: 2019-01-27 | Discharge: 2019-01-27 | Disposition: A | Discharge status: Home / Self Care | Payer: 59 | Source: Ambulatory Visit | Attending: Hematology | Admitting: Hematology

## 2019-01-27 ENCOUNTER — Other Ambulatory Visit: Payer: Self-pay | Admitting: Hematology

## 2019-01-27 ENCOUNTER — Telehealth: Payer: Self-pay

## 2019-01-27 ENCOUNTER — Other Ambulatory Visit: Payer: Self-pay

## 2019-01-27 ENCOUNTER — Inpatient Hospital Stay: Payer: 59

## 2019-01-27 ENCOUNTER — Other Ambulatory Visit: Payer: 59

## 2019-01-27 ENCOUNTER — Inpatient Hospital Stay (HOSPITAL_BASED_OUTPATIENT_CLINIC_OR_DEPARTMENT_OTHER): Payer: 59 | Admitting: Hematology

## 2019-01-27 VITALS — BP 111/75 | HR 85 | Temp 98.0°F | Resp 18 | Ht 65.0 in | Wt 104.1 lb

## 2019-01-27 DIAGNOSIS — C259 Malignant neoplasm of pancreas, unspecified: Secondary | ICD-10-CM

## 2019-01-27 DIAGNOSIS — R509 Fever, unspecified: Secondary | ICD-10-CM | POA: Diagnosis not present

## 2019-01-27 DIAGNOSIS — Z5111 Encounter for antineoplastic chemotherapy: Secondary | ICD-10-CM | POA: Diagnosis not present

## 2019-01-27 DIAGNOSIS — K819 Cholecystitis, unspecified: Secondary | ICD-10-CM

## 2019-01-27 DIAGNOSIS — R740 Nonspecific elevation of levels of transaminase and lactic acid dehydrogenase [LDH]: Secondary | ICD-10-CM

## 2019-01-27 DIAGNOSIS — Z9049 Acquired absence of other specified parts of digestive tract: Secondary | ICD-10-CM

## 2019-01-27 DIAGNOSIS — C787 Secondary malignant neoplasm of liver and intrahepatic bile duct: Secondary | ICD-10-CM

## 2019-01-27 DIAGNOSIS — R1011 Right upper quadrant pain: Secondary | ICD-10-CM

## 2019-01-27 DIAGNOSIS — Z95828 Presence of other vascular implants and grafts: Secondary | ICD-10-CM

## 2019-01-27 DIAGNOSIS — C251 Malignant neoplasm of body of pancreas: Secondary | ICD-10-CM

## 2019-01-27 LAB — CMP (CANCER CENTER ONLY)
ALT: 46 U/L — ABNORMAL HIGH (ref 0–44)
AST: 47 U/L — ABNORMAL HIGH (ref 15–41)
Albumin: 3.7 g/dL (ref 3.5–5.0)
Alkaline Phosphatase: 284 U/L — ABNORMAL HIGH (ref 38–126)
Anion gap: 14 (ref 5–15)
BUN: 28 mg/dL — ABNORMAL HIGH (ref 6–20)
CO2: 24 mmol/L (ref 22–32)
Calcium: 9.7 mg/dL (ref 8.9–10.3)
Chloride: 102 mmol/L (ref 98–111)
Creatinine: 1.2 mg/dL — ABNORMAL HIGH (ref 0.44–1.00)
GFR, Est AFR Am: 59 mL/min — ABNORMAL LOW (ref >60)
GFR, Estimated: 50 mL/min — ABNORMAL LOW (ref >60)
Glucose, Bld: 102 mg/dL — ABNORMAL HIGH (ref 70–99)
Potassium: 4 mmol/L (ref 3.5–5.1)
Sodium: 140 mmol/L (ref 135–145)
Total Bilirubin: 0.6 mg/dL (ref 0.3–1.2)
Total Protein: 8.4 g/dL — ABNORMAL HIGH (ref 6.5–8.1)

## 2019-01-27 LAB — CBC WITH DIFFERENTIAL (CANCER CENTER ONLY)
Abs Immature Granulocytes: 0.02 10*3/uL (ref 0.00–0.07)
Basophils Absolute: 0 10*3/uL (ref 0.0–0.1)
Basophils Relative: 1 %
Eosinophils Absolute: 0.1 10*3/uL (ref 0.0–0.5)
Eosinophils Relative: 1 %
HCT: 30.8 % — ABNORMAL LOW (ref 36.0–46.0)
Hemoglobin: 9.9 g/dL — ABNORMAL LOW (ref 12.0–15.0)
Immature Granulocytes: 1 %
Lymphocytes Relative: 11 %
Lymphs Abs: 0.5 10*3/uL — ABNORMAL LOW (ref 0.7–4.0)
MCH: 32.8 pg (ref 26.0–34.0)
MCHC: 32.1 g/dL (ref 30.0–36.0)
MCV: 102 fL — ABNORMAL HIGH (ref 80.0–100.0)
Monocytes Absolute: 0.1 10*3/uL (ref 0.1–1.0)
Monocytes Relative: 2 %
Neutro Abs: 3.7 10*3/uL (ref 1.7–7.7)
Neutrophils Relative %: 84 %
Platelet Count: 153 10*3/uL (ref 150–400)
RBC: 3.02 MIL/uL — ABNORMAL LOW (ref 3.87–5.11)
RDW: 13.6 % (ref 11.5–15.5)
WBC Count: 4.4 10*3/uL (ref 4.0–10.5)
nRBC: 0 % (ref 0.0–0.2)

## 2019-01-27 MED ORDER — HEPARIN SOD (PORK) LOCK FLUSH 100 UNIT/ML IV SOLN
500.0000 [IU] | Freq: Once | INTRAVENOUS | Status: AC | PRN
  Administered 2019-01-27: 500 [IU]
  Filled 2019-01-27: qty 5

## 2019-01-27 MED ORDER — SODIUM CHLORIDE 0.9% FLUSH
10.0000 mL | INTRAVENOUS | Status: DC | PRN
  Administered 2019-01-27: 10 mL
  Filled 2019-01-27: qty 10

## 2019-01-27 NOTE — Progress Notes (Signed)
[facility]   Telephone:(336) [phone] [phone]) [phone]   Clinic Follow up Note   Patient Care Team: [name], MD as PCP - General (Family Medicine) [name], MD as Consulting Physician (Otolaryngology) [name], MD as Consulting Physician (Oncology)  Date of Service:  01/27/2019  CHIEF COMPLAINT: Intermittent fever for 2 days     SUMMARY OF ONCOLOGIC HISTORY: Oncology History   Cancer Staging Pancreatic cancer [facility]) Staging form: Exocrine Pancreas, AJCC 8th Edition - Clinical stage from 08/06/2017: Stage IV (cTX, cN0, pM1) - Signed by [name], MD on 08/12/2017       Pancreatic cancer ([place])   07/23/2017 Imaging    US abdomen limited RUQ 07/23/17 IMPRESSION: 1. Cholelithiasis.  No secondary signs of acute cholecystitis. 2. Multiple solid liver masses measuring up to 6.5 cm in the left lobe of the liver, evaluation for metastatic disease is recommended. These results will be called to the ordering clinician or representative by the Radiologist Assistant, and communication documented in the PACS or zVision Dashboard.    07/23/2017 Imaging    CT Abdomen W Contrast 07/23/17 IMPRESSION: 1. Widespread metastatic disease throughout the liver. No clear primary malignancy identified in the abdomen. The pelvis was not imaged. Tissue sampling recommended. 2. Probable adenopathy superior to the pancreatic tail. No evidence of pancreatic mass. 3. Suspected incidental hemangioma inferiorly in the right hepatic lobe. 4. Nonspecific nodularity in the breasts. The patient has undergone recent (03/25/2017 and 04/01/2017) mammography and ultrasound.    07/29/2017 Initial Diagnosis    Metastasis to liver of unknown origin ([place])    07/29/2017 PET scan    PET 07/29/17  IMPRESSION: 1. Numerous bulky liver masses are hypermetabolic compatible with malignancy. 2. Accentuated activity within or along the pancreatic tail, likely represent a primary pancreatic  tumor. Consider pancreatic protocol MRI to further work this up. 3. The peripancreatic lymph node shown above the pancreatic tail is mildly hypermetabolic favoring malignancy. 4.  Prominent stool throughout the colon favors constipation. 5. 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    08/05/2017 Pathology Results    Liver Biopsy  Diagnosis 08/05/17 Liver, needle/core biopsy - CARCINOMA. - SEE COMMENT. Microscopic Comment The malignant cells are positive for cytokeratin 7. They are negative for arginase, CDX2, cytokeratin 20, estrogen receptor, GATA-3, GCDFP, Glypican 3, Hep Par 1, Napsin A, and TTF-1. This immunohistochemical is nonspecific. Possibly primary sources include pancreatobiliary and upper gastrointestinal. Radiologic correlation is necessary. Of note, organ specific markers (GATA-3, GCDFP-breast, TTF-1, Napsin A-lung, and CDX2-colon) are negative. (JBK:ecj 08/09/2017)    08/21/2017 - 02/10/2018 Chemotherapy    FOLFIRINOX every 2 weeks starting 08/21/17. Dose reduced due to neuropahty and cytopenia     10/14/2017 Imaging    CT CAP WO Contrast 10/14/17 IMPRESSION: Evidence of known numerous liver metastases without significant interval change. No other evidence of metastatic disease within the chest, abdomen or pelvis. Cholelithiasis. Tiny pericardial effusion.    12/02/2017 Genetic Testing    Negative for pathogenic mutation.  The genes analyzed were the 83 genes on [facility]'s Multi-Cancer panel (ALK, APC, ATM, AXIN2, BAP1, BARD1, BLM, BMPR1A, BRCA1, BRCA2, BRIP1, CASR, CDC73, CDH1, CDK4, CDKN1B, CDKN1C, CDKN2A, CEBPA, CHEK2, CTNNA1, DICER1, DIS3L2, EGFR, EPCAM, FH, FLCN, GATA2, GPC3, GREM1, HOXB13, HRAS, KIT, MAX, MEN1, MET, MITF, MLH1, MSH2, MSH3, MSH6, MUTYH, NBN, NF1, NF2, NTHL1, PALB2, PDGFRA, PHOX2B, PMS2, POLD1, POLE, POT1, PRKAR1A, PTCH1, PTEN, RAD50, RAD51C, RAD51D, RB1, RECQL4, RET, RUNX1, SDHA, SDHAF2, SDHB, SDHC, SDHD, SMAD4, SMARCA4, SMARCB1, SMARCE1, STK11, SUFU,  TERC,  TERT, TMEM127, TP53, TSC1, TSC2, VHL, WRN, WT1).    12/17/2017 PET scan    IMPRESSION: 1. Although the liver metastases are still visible on the CT images, they are significantly smaller and retain no abnormal metabolic activity, consistent with response to therapy. 2. No abnormal activity within the pancreas. 3. No disease progression identified. 4. Cholelithiasis.    02/21/2018 PET scan    02/21/2018 PET Scan  IMPRESSION: 1. There are 2 new small nodules identified within the right lung which measure up to 5 mm. These are too small to reliably characterize by PET-CT, but warrant close interval follow-up. 2. Again noted are multifocal liver metastasis. The target lesion within the left lobe is slightly decreased in size when compared with the previous exam. Similar to previous exam there is no abnormal hypermetabolism above background liver activity identified within the liver lesions. 3. Gallstones.    02/22/2018 - 08/25/2018 Chemotherapy    5-FU pump infusion and liposomal irinotecan, every 2 weeks.  First cycle dose reduced due to cytopenia in the travel. Stopped on 08/25/2018 due to disease progression.     05/17/2018 Imaging    05/17/2018 PET Scan IMPRESSION: 1. Mixed response. The 2 small right lung nodules have decreased in size in the interval. Single focus of increased uptake within the liver is new from 02/21/2018 and concerning for recurrent metabolically active liver metastasis. 2. Splenomegaly.  New from previous exam. 3. Diffusely increased bone marrow activity, likely reflecting treatment related changes.    09/01/2018 PET scan    PET 09/01/18 IMPRESSION: 1. Unfortunately there recurrence of hepatic metastasis with multiple new hypermetabolic lesions in LEFT and RIGHT hepatic lobe. 2. New extra hepatic site of malignancy which appears associated the mid pancreas. Difficult to define lesion on noncontrast exam. 3. Diffuse marrow activity is favored benign. 4. No  evidence of pulmonary metastasis.    09/12/2018 -  Chemotherapy    second-line Gemcitabine and Abraxane for 2 weeks on, 1 week off starting 09/12/2018. Due to neuropathy, I will start her with dose reduced Abraxane.     12/14/2018 Imaging    CT CAP  IMPRESSION: CT demonstrates acute cholecystitis, with choledocholithiasis of the cystic duct.  These results were discussed by telephone at the time of interpretation on 12/14/2018 at 5:05 pm with Dr. Burr Medico.  Redemonstration of known liver metastases, with positive response to therapy, interval reduction in size of all lesions, with multiple demonstrating significant internal necrosis.  No acute finding of the chest.  Ancillary findings as above.     Pancreatic cancer metastasized to liver ([place])   08/11/2017 Initial Diagnosis    Pancreatic cancer metastasized to liver ([place])    09/12/2018 -  Chemotherapy    second-line Gemcitabine and Abraxane for 2 weeks on, 1 week off starting 09/12/2018. Due to neuropathy, I will start her with dose reduced Abraxane.       CURRENT THERAPY:  Second-line Gemcitabine and Abraxane for 2 weeks on, 1 week off starting 09/12/2018. Cycle 5 postponed due to acute cholecystitis and only completed week 1.   INTERVAL HISTORY:  Morgan Dawson is here for a follow up. She notes last fever was 100.7 and before bed it was 100.2 and continued to resolve. She notes her abdominal pain improved since last night. She notes her appetite was down 2 days ago but has improved.     REVIEW OF SYSTEMS:   Constitutional: Denies fevers, chills or abnormal weight loss Eyes: Denies blurriness of vision Ears, nose, mouth, throat,  and face: Denies mucositis or sore throat Respiratory: Denies cough, dyspnea or wheezes Cardiovascular: Denies palpitation, chest discomfort or lower extremity swelling Gastrointestinal:  Denies nausea, heartburn or change in bowel habits  (+) Improved abdominal pain Skin: Denies abnormal skin  rashes Lymphatics: Denies new lymphadenopathy or easy bruising Neurological:Denies numbness, tingling or new weaknesses Behavioral/Psych: Mood is stable, no new changes  All other systems were reviewed with the patient and are negative.  MEDICAL HISTORY:  Past Medical History:  Diagnosis Date  . Diarrhea of presumed infectious origin 01/12/2018  . Gastroenteritis 01/12/2018  . Genetic testing 12/02/2017   Multi-Cancer panel (83 genes) @ [facility] - No pathogenic mutations detected  . Meniere disease 2016  . Pancreatitis     SURGICAL HISTORY: Past Surgical History:  Procedure Laterality Date  . CERVICAL ABLATION    . ENDOMETRIAL ABLATION  2011  . IR CHOLANGIOGRAM EXISTING TUBE  01/09/2019  . IR FLUORO GUIDE PORT INSERTION RIGHT  08/12/2017  . IR PERC CHOLECYSTOSTOMY  12/15/2018  . IR US GUIDE VASC ACCESS RIGHT  08/12/2017  . [place]    I have reviewed the social history and family history with the patient and they are unchanged from previous note.  ALLERGIES:  has No Known Allergies.  MEDICATIONS:  Current Outpatient Medications  Medication Sig Dispense Refill  . b complex vitamins tablet Take 1 tablet by mouth daily.    Marland Kitchen eltrombopag (PROMACTA) 25 MG tablet Take 1 tablet (25 mg total) by mouth daily. Take on an empty stomach, 1 hour before a meal or 2 hours after. 30 tablet 0  . gabapentin (NEURONTIN) 100 MG capsule TAKE ONE CAPSULE BY MOUTH THREE TIMES DAILY 270 capsule 1  . potassium chloride (K-DUR) 10 MEQ tablet Take 1 tablet (10 mEq total) by mouth 4 (four) times daily. 360 tablet 2  . Rivaroxaban (XARELTO) 15 MG TABS tablet Take 1 tablet (15 mg total) by mouth daily with supper for 30 days. 30 tablet 0  . loperamide (IMODIUM) 2 MG capsule Take 1 capsule (2 mg total) by mouth as needed for diarrhea or loose stools. (Patient not taking: Reported on 01/27/2019) 30 capsule 0  . loratadine (CLARITIN) 10 MG tablet Take 10 mg by mouth daily as needed for allergies.     . magic mouthwash w/lidocaine SOLN Take 10 mLs by mouth 3 (three) times daily as needed for mouth pain. Swish and spit 10 ML by mouth 3 times daily as needed for mouth pain. (Patient not taking: Reported on 01/27/2019) 240 mL 0  . meclizine (ANTIVERT) 25 MG tablet Take 25 mg by mouth 3 (three) times daily as needed for dizziness.    . ondansetron (ZOFRAN-ODT) 8 MG disintegrating tablet TAKE 1 TABLET BY MOUTH EVERY 8 HOURS AS NEEDED FOR NAUSE OR VOMITING (Patient not taking: No sig reported) 40 tablet 0  . prochlorperazine (COMPAZINE) 10 MG tablet Take 1 tablet (10 mg total) by mouth every 6 (six) hours as needed for nausea or vomiting. (Patient not taking: Reported on 01/27/2019) 40 tablet 2  . traMADol (ULTRAM) 50 MG tablet Take 1 tablet (50 mg total) by mouth every 6 (six) hours as needed for moderate pain or severe pain. (Patient not taking: Reported on 01/27/2019) 30 tablet 0   No current facility-administered medications for this visit.    Facility-Administered Medications Ordered in Other Visits  Medication Dose Route Frequency Provider Last Rate Last Dose  . sodium chloride flush (NS) 0.9 % injection 10 mL  10 mL Intracatheter Once [name], MD        PHYSICAL EXAMINATION: ECOG PERFORMANCE STATUS: 1 - Symptomatic but completely ambulatory  Vitals:   01/27/19 1537  BP: 111/75  Pulse: 85  Resp: 18  Temp: 98 F (36.7 C)  SpO2: 100%   Filed Weights   01/27/19 1537  Weight: 104 lb 1.6 oz (47.2 kg)    GENERAL:alert, no distress and comfortable SKIN: skin color, texture, turgor are normal, no rashes or significant lesions EYES: normal, Conjunctiva are pink and non-injected, sclera clear  NECK: supple, thyroid normal size, non-tender, without nodularity LYMPH:  no palpable lymphadenopathy in the cervical, axillary  LUNGS: clear to auscultation and percussion with normal breathing effort HEART: regular rate & rhythm and no murmurs and no lower extremity edema ABDOMEN:abdomen soft,  non-tender and normal bowel sounds, no hepatomegaly, Murphy sign was negative Musculoskeletal:no cyanosis of digits and no clubbing  NEURO: alert & oriented x 3 with fluent speech, no focal motor/sensory deficits  LABORATORY DATA:  I have reviewed the data as listed CBC Latest Ref Rng & Units 01/27/2019 01/23/2019 01/09/2019  WBC 4.0 - 10.5 K/uL 4.4 5.7 1.7(L)  Hemoglobin 12.0 - 15.0 g/dL 9.9(L) 10.0(L) 8.7(L)  Hematocrit 36.0 - 46.0 % 30.8(L) 31.0(L) 27.8(L)  Platelets 150 - 400 K/uL 153 180 63(L)     CMP Latest Ref Rng & Units 01/27/2019 01/23/2019 01/09/2019  Glucose 70 - 99 mg/dL 102(H) 103(H) 113(H)  BUN 6 - 20 mg/dL 28(H) 24(H) 18  Creatinine 0.44 - 1.00 mg/dL 1.20(H) 1.05(H) 1.09(H)  Sodium 135 - 145 mmol/L 140 138 142  Potassium 3.5 - 5.1 mmol/L 4.0 4.2 3.9  Chloride 98 - 111 mmol/L 102 102 106  CO2 22 - 32 mmol/L _0 Calcium 8.9 - 10.3 mg/dL 9.7 9.1 9.8  Total Protein 6.5 - 8.1 g/dL 8.4(H) 7.9 7.5  Total Bilirubin 0.3 - 1.2 mg/dL 0.6 0.4 0.4  Alkaline Phos 38 - 126 U/L 284(H) 240(H) 290(H)  AST 15 - 41 U/L 47(H) 40 84(H)  ALT 0 - 44 U/L 46(H) 39 89(H)      RADIOGRAPHIC STUDIES: I have personally reviewed the radiological images as listed and agreed with the findings in the report. No results found.   ASSESSMENT & PLAN:  CAMREE WIGINGTON is a 58 y.o. female with    1. Intermittent fever for 2 days  -She has had intermittent episodes of fever last night up to 102, no pain or other new symptoms. No fever today Physical exam not concerning for acute cholecystitis.  -Labs reviewed, CBC WNL except Hg 9.9, CMP Cr 1.2, mild transaminitis. Blood culture pending.  -Will obtain a US abdomen today   2. 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 Treated with chemo.She was treated withfirst linechemoFOLFIRINOX with dose reduction. Due to neuropathyand cytopenia, chemo changed tosecond line 5-FU pump infusion and  liposomal irinotecan every 2 weekswith dose reductiondue to cytopenia. -Unfortunately she recently had disease progression withworseningliver mets.  -She started second-line Gemcitabine and Abraxane2 weeks on/1 week offon 09/12/18. She tolerateswell with no significant side effects except hair lossand spiked fever after chemo  -Since recent hospitalization for acute cholecystitis, she has recovered well and gaining weight. Her draining output is 52m daily, she denies dehydration.  -Drain exchange was attempted in IR on 5/18 but drain fell out. Her case was discussed with IR and surgery. She had virtual visit with Dr. WDonne Hazellast week.  Surgery was not felt to be best option given it would require chemo delay.  -f/u next Monday for chemo   3. History of acute cholecystitis, status post cholecystectomy tube placement 4/23per IR -she was seen by surgery during herhospitalization, surgery was not recommended due to high risk of complication, and concerns for cancer progression when holding chemotherapy for surgery -Drain intact,currently has decreased to 79m slightlybloody drainage per day -afebrileand no chills.  -Her CT CAP from 12/14/18 shows she has numerus gallstones. Will monitor for any inflammation. -Imaging in 12/2018 consistent with cholecystitis with cystic duct obstruction. No leak on HIDA. Holding intervention for now given she is asymptomatic and invasive procedure would delay chemo.     Plan -[place]oreaAbdomen today after clinic visit  -Lab, flush, f/u and Gem/abraxane on 6/8   No problem-specific Assessment & Plan notes found for this encounter.   No orders of the defined types were placed in this encounter.  All questions were answered. The patient knows to call the clinic with any problems, questions or concerns. No barriers to learning was detected.    YTruitt Merle MD 01/27/2019   I, AJoslyn Devon am acting as scribe for YTruitt Merle MD.   I have reviewed the  above documentation for accuracy and completeness, and I agree with the above.   Addendum -her UKoreashowed numerous gallstones, gallbladder wall thickening, no biliary duct dilatation, Murphy sign was negative. -I spoke with interventional radiologist Dr. WPascal Lux who reviewed her case and ultrasound.  No indication for gallbladder drainage for now, will monitor clinically -I called pt and discussed the above, no antibiotics for now, she knows to call u[place]oreato go to ED if she develops recurrent fever or abdominal pain.  The total time spent in the appointment was 25 minutes and more than 50% was on counseling and review of test results  YTruitt Merle 01/27/2019

## 2019-01-27 NOTE — Telephone Encounter (Signed)
Left voice message for this patient per Dr. Burr Medico by here by 2:30 today for labs (blood cultures), see Dr. Burr Medico at 3:15 and have abdominal US at 4:00 (to arrive at 3:45).  Asked that she call back to confirm.  She called back and confirmed her appointment times.

## 2019-01-27 NOTE — Telephone Encounter (Signed)
Patient calls to let us know that after her treatment on Monday, started with low grade fever on Tuesday afternoon it was below 100, then by bedtime on Tuesday it was 100.7, Wednesday am 99.5 and at bedtime got up to 101.4, Thursday am 99, dinnertime 100.7 and at bedtime 100.2.  No fever this am temperature 98.6.  She denies any abdominal pain, no N/V/D.  [phone]      Message given to Dr. Burr Medico for review.

## 2019-01-28 ENCOUNTER — Encounter: Payer: Self-pay | Admitting: Hematology

## 2019-01-28 DIAGNOSIS — R509 Fever, unspecified: Secondary | ICD-10-CM | POA: Insufficient documentation

## 2019-01-30 ENCOUNTER — Encounter: Payer: Self-pay | Admitting: Nurse Practitioner

## 2019-01-30 ENCOUNTER — Inpatient Hospital Stay (HOSPITAL_BASED_OUTPATIENT_CLINIC_OR_DEPARTMENT_OTHER): Payer: 59 | Admitting: Nurse Practitioner

## 2019-01-30 ENCOUNTER — Inpatient Hospital Stay: Payer: 59

## 2019-01-30 ENCOUNTER — Other Ambulatory Visit: Payer: Self-pay

## 2019-01-30 ENCOUNTER — Other Ambulatory Visit: Payer: 59

## 2019-01-30 ENCOUNTER — Telehealth: Payer: Self-pay | Admitting: Hematology

## 2019-01-30 ENCOUNTER — Ambulatory Visit: Payer: 59

## 2019-01-30 VITALS — BP 123/82 | HR 79 | Temp 97.8°F | Resp 18 | Ht 65.0 in | Wt 106.5 lb

## 2019-01-30 DIAGNOSIS — C259 Malignant neoplasm of pancreas, unspecified: Secondary | ICD-10-CM | POA: Diagnosis not present

## 2019-01-30 DIAGNOSIS — D61810 Antineoplastic chemotherapy induced pancytopenia: Secondary | ICD-10-CM | POA: Diagnosis not present

## 2019-01-30 DIAGNOSIS — G620 Drug-induced polyneuropathy: Secondary | ICD-10-CM

## 2019-01-30 DIAGNOSIS — A689 Relapsing fever, unspecified: Secondary | ICD-10-CM

## 2019-01-30 DIAGNOSIS — E876 Hypokalemia: Secondary | ICD-10-CM

## 2019-01-30 DIAGNOSIS — C251 Malignant neoplasm of body of pancreas: Secondary | ICD-10-CM

## 2019-01-30 DIAGNOSIS — N183 Chronic kidney disease, stage 3 (moderate): Secondary | ICD-10-CM | POA: Diagnosis not present

## 2019-01-30 DIAGNOSIS — Z9049 Acquired absence of other specified parts of digestive tract: Secondary | ICD-10-CM

## 2019-01-30 DIAGNOSIS — I82402 Acute embolism and thrombosis of unspecified deep veins of left lower extremity: Secondary | ICD-10-CM

## 2019-01-30 DIAGNOSIS — Z5111 Encounter for antineoplastic chemotherapy: Secondary | ICD-10-CM | POA: Diagnosis not present

## 2019-01-30 DIAGNOSIS — C787 Secondary malignant neoplasm of liver and intrahepatic bile duct: Secondary | ICD-10-CM

## 2019-01-30 DIAGNOSIS — Z7189 Other specified counseling: Secondary | ICD-10-CM

## 2019-01-30 DIAGNOSIS — R740 Nonspecific elevation of levels of transaminase and lactic acid dehydrogenase [LDH]: Secondary | ICD-10-CM

## 2019-01-30 DIAGNOSIS — Z95828 Presence of other vascular implants and grafts: Secondary | ICD-10-CM

## 2019-01-30 LAB — COMPREHENSIVE METABOLIC PANEL
ALT: 61 U/L — ABNORMAL HIGH (ref 0–44)
AST: 53 U/L — ABNORMAL HIGH (ref 15–41)
Albumin: 3.5 g/dL (ref 3.5–5.0)
Alkaline Phosphatase: 309 U/L — ABNORMAL HIGH (ref 38–126)
Anion gap: 9 (ref 5–15)
BUN: 22 mg/dL — ABNORMAL HIGH (ref 6–20)
CO2: 26 mmol/L (ref 22–32)
Calcium: 9.4 mg/dL (ref 8.9–10.3)
Chloride: 105 mmol/L (ref 98–111)
Creatinine, Ser: 1.11 mg/dL — ABNORMAL HIGH (ref 0.44–1.00)
GFR calc Af Amer: >60 mL/min (ref >60)
GFR calc non Af Amer: 55 mL/min — ABNORMAL LOW (ref >60)
Glucose, Bld: 106 mg/dL — ABNORMAL HIGH (ref 70–99)
Potassium: 3.9 mmol/L (ref 3.5–5.1)
Sodium: 140 mmol/L (ref 135–145)
Total Bilirubin: 0.4 mg/dL (ref 0.3–1.2)
Total Protein: 7.8 g/dL (ref 6.5–8.1)

## 2019-01-30 LAB — CBC WITH DIFFERENTIAL/PLATELET
Abs Immature Granulocytes: 0.01 10*3/uL (ref 0.00–0.07)
Basophils Absolute: 0 10*3/uL (ref 0.0–0.1)
Basophils Relative: 1 %
Eosinophils Absolute: 0 10*3/uL (ref 0.0–0.5)
Eosinophils Relative: 1 %
HCT: 28.4 % — ABNORMAL LOW (ref 36.0–46.0)
Hemoglobin: 9.1 g/dL — ABNORMAL LOW (ref 12.0–15.0)
Immature Granulocytes: 1 %
Lymphocytes Relative: 32 %
Lymphs Abs: 0.6 10*3/uL — ABNORMAL LOW (ref 0.7–4.0)
MCH: 33 pg (ref 26.0–34.0)
MCHC: 32 g/dL (ref 30.0–36.0)
MCV: 102.9 fL — ABNORMAL HIGH (ref 80.0–100.0)
Monocytes Absolute: 0.4 10*3/uL (ref 0.1–1.0)
Monocytes Relative: 19 %
Neutro Abs: 1 10*3/uL — ABNORMAL LOW (ref 1.7–7.7)
Neutrophils Relative %: 46 %
Platelets: 91 10*3/uL — ABNORMAL LOW (ref 150–400)
RBC: 2.76 MIL/uL — ABNORMAL LOW (ref 3.87–5.11)
RDW: 13.5 % (ref 11.5–15.5)
WBC: 2 10*3/uL — ABNORMAL LOW (ref 4.0–10.5)
nRBC: 0 % (ref 0.0–0.2)

## 2019-01-30 MED ORDER — SODIUM CHLORIDE 0.9 % IV SOLN
600.0000 mg/m2 | Freq: Once | INTRAVENOUS | Status: AC
  Administered 2019-01-30: 874 mg via INTRAVENOUS
  Filled 2019-01-30: qty 22.99

## 2019-01-30 MED ORDER — PROCHLORPERAZINE MALEATE 10 MG PO TABS
ORAL_TABLET | ORAL | Status: AC
  Filled 2019-01-30: qty 1

## 2019-01-30 MED ORDER — SODIUM CHLORIDE 0.9% FLUSH
10.0000 mL | INTRAVENOUS | Status: DC | PRN
  Administered 2019-01-30: 10 mL
  Filled 2019-01-30: qty 10

## 2019-01-30 MED ORDER — SODIUM CHLORIDE 0.9% FLUSH
10.0000 mL | Freq: Once | INTRAVENOUS | Status: AC
  Administered 2019-01-30: 10 mL
  Filled 2019-01-30: qty 10

## 2019-01-30 MED ORDER — PEGFILGRASTIM 6 MG/0.6ML ~~LOC~~ PSKT
PREFILLED_SYRINGE | SUBCUTANEOUS | Status: AC
  Filled 2019-01-30: qty 0.6

## 2019-01-30 MED ORDER — PROCHLORPERAZINE MALEATE 10 MG PO TABS
10.0000 mg | ORAL_TABLET | Freq: Once | ORAL | Status: AC
  Administered 2019-01-30: 10 mg via ORAL

## 2019-01-30 MED ORDER — LEVOFLOXACIN 750 MG PO TABS: 750.0000 mg | ORAL_TABLET | Freq: Every day | ORAL | 0 refills | Status: DC

## 2019-01-30 MED ORDER — HEPARIN SOD (PORK) LOCK FLUSH 100 UNIT/ML IV SOLN
500.0000 [IU] | Freq: Once | INTRAVENOUS | Status: AC | PRN
  Administered 2019-01-30: 500 [IU]
  Filled 2019-01-30: qty 5

## 2019-01-30 MED ORDER — SODIUM CHLORIDE 0.9 % IV SOLN
Freq: Once | INTRAVENOUS | Status: AC
  Administered 2019-01-30: 15:00:00 via INTRAVENOUS
  Filled 2019-01-30: qty 250

## 2019-01-30 MED ORDER — PEGFILGRASTIM 6 MG/0.6ML ~~LOC~~ PSKT
6.0000 mg | PREFILLED_SYRINGE | Freq: Once | SUBCUTANEOUS | Status: AC
  Administered 2019-01-30: 6 mg via SUBCUTANEOUS

## 2019-01-30 MED ORDER — PACLITAXEL PROTEIN-BOUND CHEMO INJECTION 100 MG
80.0000 mg/m2 | Freq: Once | INTRAVENOUS | Status: AC
  Administered 2019-01-30: 16:00:00 125 mg via INTRAVENOUS
  Filled 2019-01-30: qty 25

## 2019-01-30 NOTE — Telephone Encounter (Signed)
No los per 6/5. °

## 2019-01-30 NOTE — Patient Instructions (Signed)
[facility] Discharge Instructions for Patients Receiving Chemotherapy  Today you received the following chemotherapy agents:  Gemzar (gemcitabine), Abraxane (protein bound paclitaxel)  To help prevent nausea and vomiting after your treatment, we encourage you to take your nausea medication as prescribed.   If you develop nausea and vomiting that is not controlled by your nausea medication, call the clinic.   BELOW ARE SYMPTOMS THAT SHOULD BE REPORTED IMMEDIATELY:  *FEVER GREATER THAN 100.5 F  *CHILLS WITH OR WITHOUT FEVER  NAUSEA AND VOMITING THAT IS NOT CONTROLLED WITH YOUR NAUSEA MEDICATION  *UNUSUAL SHORTNESS OF BREATH  *UNUSUAL BRUISING OR BLEEDING  TENDERNESS IN MOUTH AND THROAT WITH OR WITHOUT PRESENCE OF ULCERS  *URINARY PROBLEMS  *BOWEL PROBLEMS  UNUSUAL RASH Items with * indicate a potential emergency and should be followed up as soon as possible.  Feel free to call the clinic should you have any questions or concerns. The clinic phone number is [phone].  Please show the [place] at check-in to the Emergency Department and triage nurse.

## 2019-01-30 NOTE — Progress Notes (Signed)
[facility] order added per Dr. Burr Medico. PA approved. Kennith Center, Pharm.D., CPP 01/30/2019@3 :30 PM

## 2019-01-30 NOTE — Progress Notes (Signed)
Per Arvin Collard to tx with low platelets and ANC 1.0 today. Will come back for neulasta on Wednesday.

## 2019-01-30 NOTE — Progress Notes (Signed)
Morgan Dawson   [phone] [phone]   Clinic Follow up Note   Patient Care Team: [name], MD as PCP - General (Family Medicine) [name], MD as Consulting Physician (Otolaryngology) [name], MD as Consulting Physician (Oncology) 01/30/2019  CHIEF COMPLAINT: f/u pancreatic cancer   SUMMARY OF ONCOLOGIC HISTORY: Oncology History   Cancer Staging Pancreatic cancer [facility]) Staging form: Exocrine Pancreas, AJCC 8th Edition - Clinical stage from 08/06/2017: Stage IV (cTX, cN0, pM1) - Signed by [name], MD on 08/12/2017       Pancreatic cancer ([place])   07/23/2017 Imaging    US abdomen limited RUQ 07/23/17 IMPRESSION: 1. Cholelithiasis.  No secondary signs of acute cholecystitis. 2. Multiple solid liver masses measuring up to 6.5 cm in the left lobe of the liver, evaluation for metastatic disease is recommended. These results will be called to the ordering clinician or representative by the Radiologist Assistant, and communication documented in the PACS or zVision Dashboard.    07/23/2017 Imaging    CT Abdomen W Contrast 07/23/17 IMPRESSION: 1. Widespread metastatic disease throughout the liver. No clear primary malignancy identified in the abdomen. The pelvis was not imaged. Tissue sampling recommended. 2. Probable adenopathy superior to the pancreatic tail. No evidence of pancreatic mass. 3. Suspected incidental hemangioma inferiorly in the right hepatic lobe. 4. Nonspecific nodularity in the breasts. The patient has undergone recent (03/25/2017 and 04/01/2017) mammography and ultrasound.    07/29/2017 Initial Diagnosis    Metastasis to liver of unknown origin ([place])    07/29/2017 PET scan    PET 07/29/17  IMPRESSION: 1. Numerous bulky liver masses are hypermetabolic compatible with malignancy. 2. Accentuated activity within or along the pancreatic tail, likely represent a primary pancreatic tumor. Consider pancreatic  protocol MRI to further work this up. 3. The peripancreatic lymph node shown above the pancreatic tail is mildly hypermetabolic favoring malignancy. 4.  Prominent stool throughout the colon favors constipation. 5. 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    08/05/2017 Pathology Results    Liver Biopsy  Diagnosis 08/05/17 Liver, needle/core biopsy - CARCINOMA. - SEE COMMENT. Microscopic Comment The malignant cells are positive for cytokeratin 7. They are negative for arginase, CDX2, cytokeratin 20, estrogen receptor, GATA-3, GCDFP, Glypican 3, Hep Par 1, Napsin A, and TTF-1. This immunohistochemical is nonspecific. Possibly primary sources include pancreatobiliary and upper gastrointestinal. Radiologic correlation is necessary. Of note, organ specific markers (GATA-3, GCDFP-breast, TTF-1, Napsin A-lung, and CDX2-colon) are negative. (JBK:ecj 08/09/2017)    08/21/2017 - 02/10/2018 Chemotherapy    FOLFIRINOX every 2 weeks starting 08/21/17. Dose reduced due to neuropahty and cytopenia     10/14/2017 Imaging    CT CAP WO Contrast 10/14/17 IMPRESSION: Evidence of known numerous liver metastases without significant interval change. No other evidence of metastatic disease within the chest, abdomen or pelvis. Cholelithiasis. Tiny pericardial effusion.    12/02/2017 Genetic Testing    Negative for pathogenic mutation.  The genes analyzed were the 83 genes on [facility]'s Multi-Cancer panel (ALK, APC, ATM, AXIN2, BAP1, BARD1, BLM, BMPR1A, BRCA1, BRCA2, BRIP1, CASR, CDC73, CDH1, CDK4, CDKN1B, CDKN1C, CDKN2A, CEBPA, CHEK2, CTNNA1, DICER1, DIS3L2, EGFR, EPCAM, FH, FLCN, GATA2, GPC3, GREM1, HOXB13, HRAS, KIT, MAX, MEN1, MET, MITF, MLH1, MSH2, MSH3, MSH6, MUTYH, NBN, NF1, NF2, NTHL1, PALB2, PDGFRA, PHOX2B, PMS2, POLD1, POLE, POT1, PRKAR1A, PTCH1, PTEN, RAD50, RAD51C, RAD51D, RB1, RECQL4, RET, RUNX1, SDHA, SDHAF2, SDHB, SDHC, SDHD, SMAD4, SMARCA4, SMARCB1, SMARCE1, STK11, SUFU, TERC, TERT, TMEM127, TP53,  TSC1, TSC2, VHL, WRN, WT1).  12/17/2017 PET scan    IMPRESSION: 1. Although the liver metastases are still visible on the CT images, they are significantly smaller and retain no abnormal metabolic activity, consistent with response to therapy. 2. No abnormal activity within the pancreas. 3. No disease progression identified. 4. Cholelithiasis.    02/21/2018 PET scan    02/21/2018 PET Scan  IMPRESSION: 1. There are 2 new small nodules identified within the right lung which measure up to 5 mm. These are too small to reliably characterize by PET-CT, but warrant close interval follow-up. 2. Again noted are multifocal liver metastasis. The target lesion within the left lobe is slightly decreased in size when compared with the previous exam. Similar to previous exam there is no abnormal hypermetabolism above background liver activity identified within the liver lesions. 3. Gallstones.    02/22/2018 - 08/25/2018 Chemotherapy    5-FU pump infusion and liposomal irinotecan, every 2 weeks.  First cycle dose reduced due to cytopenia in the travel. Stopped on 08/25/2018 due to disease progression.     05/17/2018 Imaging    05/17/2018 PET Scan IMPRESSION: 1. Mixed response. The 2 small right lung nodules have decreased in size in the interval. Single focus of increased uptake within the liver is new from 02/21/2018 and concerning for recurrent metabolically active liver metastasis. 2. Splenomegaly.  New from previous exam. 3. Diffusely increased bone marrow activity, likely reflecting treatment related changes.    09/01/2018 PET scan    PET 09/01/18 IMPRESSION: 1. Unfortunately there recurrence of hepatic metastasis with multiple new hypermetabolic lesions in LEFT and RIGHT hepatic lobe. 2. New extra hepatic site of malignancy which appears associated the mid pancreas. Difficult to define lesion on noncontrast exam. 3. Diffuse marrow activity is favored benign. 4. No evidence of pulmonary  metastasis.    09/12/2018 -  Chemotherapy    second-line Gemcitabine and Abraxane for 2 weeks on, 1 week off starting 09/12/2018. Due to neuropathy, I will start her with dose reduced Abraxane.     12/14/2018 Imaging    CT CAP  IMPRESSION: CT demonstrates acute cholecystitis, with choledocholithiasis of the cystic duct.  These results were discussed by telephone at the time of interpretation on 12/14/2018 at 5:05 pm with Dr. Burr Medico.  Redemonstration of known liver metastases, with positive response to therapy, interval reduction in size of all lesions, with multiple demonstrating significant internal necrosis.  No acute finding of the chest.  Ancillary findings as above.     Pancreatic cancer metastasized to liver ([place])   08/11/2017 Initial Diagnosis    Pancreatic cancer metastasized to liver ([place])    09/12/2018 -  Chemotherapy    second-line Gemcitabine and Abraxane for 2 weeks on, 1 week off starting 09/12/2018. Due to neuropathy, I will start her with dose reduced Abraxane.      CURRENT THERAPY: Second-line Gemcitabine and Abraxane for 2 weeks on, 1 week off starting 09/12/2018. Cycle 5 postponed due to acute cholecystitisand only completed week 1. S/p cycle 7 day 1 on 01/23/19  INTERVAL HISTORY:  Ms. Westerman returns for follow up and treatment as scheduled. She completed cycle 7 day 1 gemcitabine and abraxane on 01/23/19. She developed intermittent fever up to 101.4 last week and was seen in clinic on 01/27/19 by Dr. Burr Medico. Blood cultures no growth x3 days. [place] was done, did not indicate need for gallbladder drainage. Over the weekend she denies recurrent fevers or abdominal pain. Feels well today with good energy level. Appetite is better. She gained 2 lbs.  Has 1 soft stool per day. She started promacta on 01/26/19. Denies bleeding. Denies n/v. Denies cough, chest pain, dyspnea, leg edema.    MEDICAL HISTORY:  Past Medical History:  Diagnosis Date  . Diarrhea of presumed infectious  origin 01/12/2018  . Gastroenteritis 01/12/2018  . Genetic testing 12/02/2017   Multi-Cancer panel (83 genes) @ [facility] - No pathogenic mutations detected  . Meniere disease 2016  . Pancreatitis     SURGICAL HISTORY: Past Surgical History:  Procedure Laterality Date  . CERVICAL ABLATION    . ENDOMETRIAL ABLATION  2011  . IR CHOLANGIOGRAM EXISTING TUBE  01/09/2019  . IR FLUORO GUIDE PORT INSERTION RIGHT  08/12/2017  . IR PERC CHOLECYSTOSTOMY  12/15/2018  . IR US GUIDE VASC ACCESS RIGHT  08/12/2017  . [place]    I have reviewed the social history and family history with the patient and they are unchanged from previous note.  ALLERGIES:  has No Known Allergies.  MEDICATIONS:  Current Outpatient Medications  Medication Sig Dispense Refill  . b complex vitamins tablet Take 1 tablet by mouth daily.    Marland Kitchen eltrombopag (PROMACTA) 25 MG tablet Take 1 tablet (25 mg total) by mouth daily. Take on an empty stomach, 1 hour before a meal or 2 hours after. 30 tablet 0  . gabapentin (NEURONTIN) 100 MG capsule TAKE ONE CAPSULE BY MOUTH THREE TIMES DAILY 270 capsule 1  . loperamide (IMODIUM) 2 MG capsule Take 1 capsule (2 mg total) by mouth as needed for diarrhea or loose stools. 30 capsule 0  . loratadine (CLARITIN) 10 MG tablet Take 10 mg by mouth daily as needed for allergies.    . magic mouthwash w/lidocaine SOLN Take 10 mLs by mouth 3 (three) times daily as needed for mouth pain. Swish and spit 10 ML by mouth 3 times daily as needed for mouth pain. 240 mL 0  . meclizine (ANTIVERT) 25 MG tablet Take 25 mg by mouth 3 (three) times daily as needed for dizziness.    . ondansetron (ZOFRAN-ODT) 8 MG disintegrating tablet TAKE 1 TABLET BY MOUTH EVERY 8 HOURS AS NEEDED FOR NAUSE OR VOMITING 40 tablet 0  . potassium chloride (K-DUR) 10 MEQ tablet Take 1 tablet (10 mEq total) by mouth 4 (four) times daily. 360 tablet 2  . prochlorperazine (COMPAZINE) 10 MG tablet Take 1 tablet (10 mg total) by  mouth every 6 (six) hours as needed for nausea or vomiting. 40 tablet 2  . Rivaroxaban (XARELTO) 15 MG TABS tablet Take 1 tablet (15 mg total) by mouth daily with supper for 30 days. 30 tablet 0  . traMADol (ULTRAM) 50 MG tablet Take 1 tablet (50 mg total) by mouth every 6 (six) hours as needed for moderate pain or severe pain. 30 tablet 0  . levofloxacin (LEVAQUIN) 750 MG tablet Take 1 tablet (750 mg total) by mouth daily. 5 tablet 0   No current facility-administered medications for this visit.    Facility-Administered Medications Ordered in Other Visits  Medication Dose Route Frequency Provider Last Rate Last Dose  . gemcitabine (GEMZAR) 874 mg in sodium chloride 0.9 % 250 mL chemo infusion  600 mg/m2 (Treatment Plan Recorded) Intravenous Once [name], MD      . heparin lock flush 100 unit/mL  500 Units Intracatheter Once PRN [name], MD      . pegfilgrastim (NEULASTA ONPRO KIT) injection 6 mg  6 mg Subcutaneous Once [name], MD      .  sodium chloride flush (NS) 0.9 % injection 10 mL  10 mL Intracatheter Once [name], MD      . sodium chloride flush (NS) 0.9 % injection 10 mL  10 mL Intracatheter PRN [name], MD        PHYSICAL EXAMINATION: ECOG PERFORMANCE STATUS: 1 - Symptomatic but completely ambulatory  Vitals:   01/30/19 1337  BP: 123/82  Pulse: 79  Resp: 18  Temp: 97.8 F (36.6 C)  SpO2: 100%   Filed Weights   01/30/19 1337  Weight: 106 lb 8 oz (48.3 kg)    GENERAL:alert, no distress and comfortable SKIN: no rash EYES:  sclera clear LUNGS: respirations even and unlabored  HEART:  no lower extremity edema ABDOMEN: flat, non-tender  Musculoskeletal:no cyanosis of digits  NEURO: alert & oriented x 3 with fluent speech, normal gait PAC without erythema  Limited exam for covid19 outbreak  LABORATORY DATA:  I have reviewed the data as listed CBC Latest Ref Rng & Units 01/30/2019 01/27/2019 01/23/2019  WBC 4.0 - 10.5 K/uL 2.0(L) 4.4 5.7  Hemoglobin 12.0 - 15.0  g/dL 9.1(L) 9.9(L) 10.0(L)  Hematocrit 36.0 - 46.0 % 28.4(L) 30.8(L) 31.0(L)  Platelets 150 - 400 K/uL 91(L) 153 180     CMP Latest Ref Rng & Units 01/30/2019 01/27/2019 01/23/2019  Glucose 70 - 99 mg/dL 106(H) 102(H) 103(H)  BUN 6 - 20 mg/dL 22(H) 28(H) 24(H)  Creatinine 0.44 - 1.00 mg/dL 1.11(H) 1.20(H) 1.05(H)  Sodium 135 - 145 mmol/L 140 140 138  Potassium 3.5 - 5.1 mmol/L 3.9 4.0 4.2  Chloride 98 - 111 mmol/L 105 102 102  CO2 22 - 32 mmol/L 26 24 28   Calcium 8.9 - 10.3 mg/dL 9.4 9.7 9.1  Total Protein 6.5 - 8.1 g/dL 7.8 8.4(H) 7.9  Total Bilirubin 0.3 - 1.2 mg/dL 0.4 0.6 0.4  Alkaline Phos 38 - 126 U/L 309(H) 284(H) 240(H)  AST 15 - 41 U/L 53(H) 47(H) 40  ALT 0 - 44 U/L 61(H) 46(H) 39      RADIOGRAPHIC STUDIES: I have personally reviewed the radiological images as listed and agreed with the findings in the report. No results found.   ASSESSMENT & PLAN: Morgan Dawson a 58 y.o.femalewith   1. 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 She was treated withfirst linechemoFOLFIRINOX with dose reduction. Due to neuropathyand cytopenia, chemo changed tosecond line 5-FU pump infusion and liposomal irinotecan every 2 weekswith dose reduction -Unfortunately she had disease progression withworseningliver mets.  -She started second-line Gemcitabine and Abraxane2 weeks on/1 week offon 09/12/18. She tolerateswell with no significant side effects except hair lossand spiked fever afterchemo -she developed acute cholecystitis and was hospitalized, s/p perc chole drain; she recovered well and resumed chemotherapy. After cycle 6 day 1 (01/02/19) she developed recurrent fever spikes and pancytopenia. Treatment was held for possible recurrent infection on 5/18. -Drain exchange was attempted in IR on 5/18 but drain fell out. Her case was discussed with IR and surgery. She had virtual visit with Dr. Donne Hazel. Surgery was  not felt to be best option given it would require chemo delay.  -she is at high risk for recurrent infection on chemotherapy; we reviewed signs of recurrent acute cholecystitis -she resumed chemotherapy cycle 7 on 6/1 with dose-reduced gemcitabine and abraxane; she developed recurrent fever next day after treatment, up to 101.4. she was seen in clinic on 6/5. Abdominal imaging did not indicate gallbladder drainage at this time.  -Today  she appears well, no recurrent fever or abdominal pain over the weekend.  -Labs reviewed, ANC 1.0, plt 91K. She started promacta, no bleeding. Cr improved 1.1, LFTs slightly increased -I discussed with Dr. Burr Medico and [facility], PharmD; will proceed with chemotherapy today, current doses gemcitabine 600 mg/m2 and abraxane 80 mg/m2  -She will get onpro today with chemo; we reviewed side effects.  -If she continues to have neutropenia, Dr. Burr Medico may change chemo to every 2 weeks. -Additionally, given her neutropenia and high risk for infection, will give course of levaquin 750 mg daily x5 to prevent infection    -F/u in 2 weeks with next cycle   2. Weight lossandmalnutrition -Due to chemo and underlying metastatic cancer. -she uses supplements -weight is up 2 lbs from last week    3. Transaminitis -Due to underlying chemo and malignancy -ALT/AST, alkphos slightly increased; bili is normal   4. Pancytopenia, secondary to chemo -cycle 6 day 8 chemo was held on 5/18 -she resumed chemotherapy with dose-reductions on 6/1, she remains pancytopenic today -ANC 1.0, plt 91K; proceed with dose-reduced chemo today -we reviewed s/sx of recurrent infection  5. Hypokalemia, CKD Stage III -Sheis currently on KCL supplements -Cr 1.1, K 3.9 on 30 mEq KCL daily   6.Peripheral neuropathy, grade 2 -Currently on Neurontin 200 mg at night and B complexes. Continue. -neuropathy has increased slightly in her hands, she has some difficulty with manipulating small  jewelry, but otherwise functions well -monitoring closely   7.Acute cholecystitis, status post cholecystectomy tube placement 4/23per IR -she was seen by surgery during herhospitalization, surgery was not recommended due to high risk of complication, and concerns for cancer progression when holding chemotherapy for surgery -Drain was placed during hospitalization, with slightlybloody drainage daily  -Her CT CAP from 12/14/18 shows she has numerus gallstones. -Drain fell out on 5/18, it was deemed best to not replace at this time given imaging and further intervention would delay chemo -imaging in 12/2018 consistent with cholecystitis with cystic duct obstruction. No leak on HIDA. Holding intervention for now given she is asymptomatic and invasive procedure would delay chemo.  -[place] on 6/5 was done due to recurrent fever after resuming chemo; no indication for gallbladder drainage at this time -we are monitoring closely  8. LE DVT -Diagnosed on December 15, 2018, when she was hospitalized for cholecystitis -Due to her bloody biliary drainage, and moderate thrombocytopenia from chemotherapy, she is on low-dose xarelto98m daily (no loading dose) -tolerating well, no bleeding -platelet count is adequate to continue  9.Goal of care discussion -She is full code now -treatment goal is palliative  PLAN: -Labs reviewed, ANC 1.0, plt 91K -continue promacta -Proceed with dose-reduced chemotherapy cycle 7 day 8 gemcitabine 600 mg/m2 and abraxane 80 mg/m2 - discussed with Dr. FBurr Medicoand GKennith Center PharmD -Onpro with chemo today  -levaquin 750 mg daily x5 days, start day after chemo -Call/return for recurrent fever, abdominal pain  -Return in 2 weeks for f/u and next cycle   All questions were answered. The patient knows to call the clinic with any problems, questions or concerns. No barriers to learning was detected. I spent 20 minutes counseling the patient face to face. The total time spent  in the appointment was 25 minutes and more than 50% was on counseling and review of test results     LAlla Feeling NP 01/30/19

## 2019-01-30 NOTE — Telephone Encounter (Signed)
Oral Oncology Patient Advocate Encounter  I confirmed with [facility] that the patient received the Promacta on 6/4 with a $0 copay.  [facility] [place] Phone [phone] Fax [phone] 01/30/2019   9:59 AM

## 2019-01-31 ENCOUNTER — Telehealth: Payer: Self-pay | Admitting: Nurse Practitioner

## 2019-01-31 NOTE — Telephone Encounter (Signed)
Per 6/8 los appt not executed per lacie, patient does not an injection she has an onpro device.

## 2019-02-01 LAB — CULTURE, BLOOD (SINGLE)
Culture: NO GROWTH
Culture: NO GROWTH
Special Requests: ADEQUATE

## 2019-02-06 ENCOUNTER — Other Ambulatory Visit: Payer: 59

## 2019-02-06 ENCOUNTER — Ambulatory Visit: Payer: 59

## 2019-02-09 ENCOUNTER — Other Ambulatory Visit: Payer: 59

## 2019-02-09 ENCOUNTER — Other Ambulatory Visit: Payer: Self-pay | Admitting: Hematology

## 2019-02-09 DIAGNOSIS — D696 Thrombocytopenia, unspecified: Secondary | ICD-10-CM

## 2019-02-10 NOTE — Progress Notes (Signed)
Morgan Dawson   [phone] [phone]   Clinic Follow up Note   Patient Care Team: [name], MD as PCP - General (Family Medicine) [name], MD as Consulting Physician (Otolaryngology) [name], MD as Consulting Physician (Oncology)  Date of Service:  02/13/2019  CHIEF COMPLAINT: Intermittent fever for 2 days    SUMMARY OF ONCOLOGIC HISTORY: Oncology History Overview Note  Cancer Staging Pancreatic cancer [facility]) Staging form: Exocrine Pancreas, AJCC 8th Edition - Clinical stage from 08/06/2017: Stage IV (cTX, cN0, pM1) - Signed by [name], MD on 08/12/2017     Pancreatic cancer ([place])  07/23/2017 Imaging   US abdomen limited RUQ 07/23/17 IMPRESSION: 1. Cholelithiasis.  No secondary signs of acute cholecystitis. 2. Multiple solid liver masses measuring up to 6.5 cm in the left lobe of the liver, evaluation for metastatic disease is recommended. These results will be called to the ordering clinician or representative by the Radiologist Assistant, and communication documented in the PACS or zVision Dashboard.   07/23/2017 Imaging   CT Abdomen W Contrast 07/23/17 IMPRESSION: 1. Widespread metastatic disease throughout the liver. No clear primary malignancy identified in the abdomen. The pelvis was not imaged. Tissue sampling recommended. 2. Probable adenopathy superior to the pancreatic tail. No evidence of pancreatic mass. 3. Suspected incidental hemangioma inferiorly in the right hepatic lobe. 4. Nonspecific nodularity in the breasts. The patient has undergone recent (03/25/2017 and 04/01/2017) mammography and ultrasound.   07/29/2017 Initial Diagnosis   Metastasis to liver of unknown origin ([place])   07/29/2017 PET scan   PET 07/29/17  IMPRESSION: 1. Numerous bulky liver masses are hypermetabolic compatible with malignancy. 2. Accentuated activity within or along the pancreatic tail, likely represent a primary pancreatic  tumor. Consider pancreatic protocol MRI to further work this up. 3. The peripancreatic lymph node shown above the pancreatic tail is mildly hypermetabolic favoring malignancy. 4.  Prominent stool throughout the colon favors constipation. 5. 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   08/05/2017 Pathology Results   Liver Biopsy  Diagnosis 08/05/17 Liver, needle/core biopsy - CARCINOMA. - SEE COMMENT. Microscopic Comment The malignant cells are positive for cytokeratin 7. They are negative for arginase, CDX2, cytokeratin 20, estrogen receptor, GATA-3, GCDFP, Glypican 3, Hep Par 1, Napsin A, and TTF-1. This immunohistochemical is nonspecific. Possibly primary sources include pancreatobiliary and upper gastrointestinal. Radiologic correlation is necessary. Of note, organ specific markers (GATA-3, GCDFP-breast, TTF-1, Napsin A-lung, and CDX2-colon) are negative. (JBK:ecj 08/09/2017)   08/21/2017 - 02/10/2018 Chemotherapy   FOLFIRINOX every 2 weeks starting 08/21/17. Dose reduced due to neuropahty and cytopenia    10/14/2017 Imaging   CT CAP WO Contrast 10/14/17 IMPRESSION: Evidence of known numerous liver metastases without significant interval change. No other evidence of metastatic disease within the chest, abdomen or pelvis. Cholelithiasis. Tiny pericardial effusion.   12/02/2017 Genetic Testing   Negative for pathogenic mutation.  The genes analyzed were the 83 genes on [facility]'s Multi-Cancer panel (ALK, APC, ATM, AXIN2, BAP1, BARD1, BLM, BMPR1A, BRCA1, BRCA2, BRIP1, CASR, CDC73, CDH1, CDK4, CDKN1B, CDKN1C, CDKN2A, CEBPA, CHEK2, CTNNA1, DICER1, DIS3L2, EGFR, EPCAM, FH, FLCN, GATA2, GPC3, GREM1, HOXB13, HRAS, KIT, MAX, MEN1, MET, MITF, MLH1, MSH2, MSH3, MSH6, MUTYH, NBN, NF1, NF2, NTHL1, PALB2, PDGFRA, PHOX2B, PMS2, POLD1, POLE, POT1, PRKAR1A, PTCH1, PTEN, RAD50, RAD51C, RAD51D, RB1, RECQL4, RET, RUNX1, SDHA, SDHAF2, SDHB, SDHC, SDHD, SMAD4, SMARCA4, SMARCB1, SMARCE1, STK11, SUFU, TERC,  TERT, TMEM127, TP53, TSC1, TSC2, VHL, WRN, WT1).   12/17/2017 PET scan   IMPRESSION: 1. Although  the liver metastases are still visible on the CT images, they are significantly smaller and retain no abnormal metabolic activity, consistent with response to therapy. 2. No abnormal activity within the pancreas. 3. No disease progression identified. 4. Cholelithiasis.   02/21/2018 PET scan   02/21/2018 PET Scan  IMPRESSION: 1. There are 2 new small nodules identified within the right lung which measure up to 5 mm. These are too small to reliably characterize by PET-CT, but warrant close interval follow-up. 2. Again noted are multifocal liver metastasis. The target lesion within the left lobe is slightly decreased in size when compared with the previous exam. Similar to previous exam there is no abnormal hypermetabolism above background liver activity identified within the liver lesions. 3. Gallstones.   02/22/2018 - 08/25/2018 Chemotherapy   5-FU pump infusion and liposomal irinotecan, every 2 weeks.  First cycle dose reduced due to cytopenia in the travel. Stopped on 08/25/2018 due to disease progression.    05/17/2018 Imaging   05/17/2018 PET Scan IMPRESSION: 1. Mixed response. The 2 small right lung nodules have decreased in size in the interval. Single focus of increased uptake within the liver is new from 02/21/2018 and concerning for recurrent metabolically active liver metastasis. 2. Splenomegaly.  New from previous exam. 3. Diffusely increased bone marrow activity, likely reflecting treatment related changes.   09/01/2018 PET scan   PET 09/01/18 IMPRESSION: 1. Unfortunately there recurrence of hepatic metastasis with multiple new hypermetabolic lesions in LEFT and RIGHT hepatic lobe. 2. New extra hepatic site of malignancy which appears associated the mid pancreas. Difficult to define lesion on noncontrast exam. 3. Diffuse marrow activity is favored benign. 4. No evidence of  pulmonary metastasis.   09/12/2018 -  Chemotherapy   second-line Gemcitabine and Abraxane for 2 weeks on, 1 week off starting 09/12/2018. Due to neuropathy, I will start her with dose reduced Abraxane.    12/14/2018 Imaging   CT CAP  IMPRESSION: CT demonstrates acute cholecystitis, with choledocholithiasis of the cystic duct.  These results were discussed by telephone at the time of interpretation on 12/14/2018 at 5:05 pm with Dr. Burr Medico.  Redemonstration of known liver metastases, with positive response to therapy, interval reduction in size of all lesions, with multiple demonstrating significant internal necrosis.  No acute finding of the chest.  Ancillary findings as above.   Pancreatic cancer metastasized to liver ([place])  08/11/2017 Initial Diagnosis   Pancreatic cancer metastasized to liver ([place])   09/12/2018 -  Chemotherapy   second-line Gemcitabine and Abraxane for 2 weeks on, 1 week off starting 09/12/2018. Due to neuropathy, I will start her with dose reduced Abraxane.       CURRENT THERAPY:  Second-line Gemcitabine and Abraxane for 2 weeks on, 1 week off starting 09/12/2018. Cycle 5 postponed due to acute cholecystitisand only completed week 1.  INTERVAL HISTORY:  Morgan Dawson is here for a follow up and treatment. She presents to the clinic alone. She notes a fever starting the day after treatment and peaking at 102 on Wednesday, 2 days following treatment. She took a 5 day course of antibiotics and denies any fevers since. She denies any pain. She reports bloating and abdominal discomfort yesterday after eating fried foods. This has resolved. She denies any diarrhea or mouth sores. Neuropathy is stable on gabapentin 3x daily. She has bruises in her left leg, but denies and falls or trauma. She questioned if she could drink an occasional beer.    REVIEW OF SYSTEMS:   Constitutional:  Denies chills or abnormal weight loss (+) fever, resolved Eyes: Denies blurriness of  vision Ears, nose, mouth, throat, and face: Denies mucositis or sore throat Respiratory: Denies cough, dyspnea or wheezes Cardiovascular: Denies palpitation, chest discomfort or lower extremity swelling Gastrointestinal:  Denies nausea, heartburn or change in bowel habits (+) abd discomfort, resolved Skin: Denies abnormal skin rashes Lymphatics: Denies new lymphadenopathy or easy bruising Neurological: (+) numbness, tingling in fingers and toes Behavioral/Psych: Mood is stable, no new changes  All other systems were reviewed with the patient and are negative.  MEDICAL HISTORY:  Past Medical History:  Diagnosis Date   Diarrhea of presumed infectious origin 01/12/2018   Gastroenteritis 01/12/2018   Genetic testing 12/02/2017   Multi-Cancer panel (83 genes) @ [facility] - No pathogenic mutations detected   Meniere disease 2016   Pancreatitis     SURGICAL HISTORY: Past Surgical History:  Procedure Laterality Date   CERVICAL ABLATION     ENDOMETRIAL ABLATION  2011   IR CHOLANGIOGRAM EXISTING TUBE  01/09/2019   IR FLUORO GUIDE PORT INSERTION RIGHT  08/12/2017   IR PERC CHOLECYSTOSTOMY  12/15/2018   IR US GUIDE VASC ACCESS RIGHT  08/12/2017   MANDIBLE SURGERY  1999    I have reviewed the social history and family history with the patient and they are unchanged from previous note.  ALLERGIES:  has No Known Allergies.  MEDICATIONS:  Current Outpatient Medications  Medication Sig Dispense Refill   b complex vitamins tablet Take 1 tablet by mouth daily.     gabapentin (NEURONTIN) 100 MG capsule TAKE ONE CAPSULE BY MOUTH THREE TIMES DAILY 270 capsule 1   lidocaine-prilocaine (EMLA) cream Apply 1 application topically as needed.     loperamide (IMODIUM) 2 MG capsule Take 1 capsule (2 mg total) by mouth as needed for diarrhea or loose stools. 30 capsule 0   loratadine (CLARITIN) 10 MG tablet Take 10 mg by mouth daily as needed for allergies.     magic mouthwash w/lidocaine  SOLN Take 10 mLs by mouth 3 (three) times daily as needed for mouth pain. Swish and spit 10 ML by mouth 3 times daily as needed for mouth pain. 240 mL 0   meclizine (ANTIVERT) 25 MG tablet Take 25 mg by mouth 3 (three) times daily as needed for dizziness.     ondansetron (ZOFRAN-ODT) 8 MG disintegrating tablet TAKE 1 TABLET BY MOUTH EVERY 8 HOURS AS NEEDED FOR NAUSE OR VOMITING 40 tablet 0   potassium chloride (K-DUR) 10 MEQ tablet Take 1 tablet (10 mEq total) by mouth 4 (four) times daily. 360 tablet 2   prochlorperazine (COMPAZINE) 10 MG tablet Take 1 tablet (10 mg total) by mouth every 6 (six) hours as needed for nausea or vomiting. 40 tablet 2   PROMACTA 25 MG tablet TAKE 1 TABLET BY MOUTH ONCE DAILY ON AN EMPTY STOMACH  AT LEAST 1 HOUR BEFORE OR 2 HOURS AFTER A MEAL 30 tablet 0   Rivaroxaban (XARELTO) 15 MG TABS tablet Take 1 tablet (15 mg total) by mouth daily with supper for 30 days. 30 tablet 0   traMADol (ULTRAM) 50 MG tablet Take 1 tablet (50 mg total) by mouth every 6 (six) hours as needed for moderate pain or severe pain. 30 tablet 0   levofloxacin (LEVAQUIN) 500 MG tablet Take 1 tablet (500 mg total) by mouth daily. Take for 5 days after chemo 10 tablet 1   No current facility-administered medications for this visit.    Facility-Administered Medications  Ordered in Other Visits  Medication Dose Route Frequency Provider Last Rate Last Dose   sodium chloride flush (NS) 0.9 % injection 10 mL  10 mL Intracatheter Once [name], MD        PHYSICAL EXAMINATION: ECOG PERFORMANCE STATUS: 2 - Symptomatic, <50% confined to bed  Vitals:   02/13/19 1031  BP: 107/71  Pulse: 87  Resp: 18  Temp: 98.7 F (37.1 C)  SpO2: 99%   Filed Weights   02/13/19 1031  Weight: 107 lb (48.5 kg)    GENERAL:alert, no distress and comfortable SKIN: skin color, texture, turgor are normal, no rashes or significant lesions (+) bruising on BLE EYES: normal, Conjunctiva are pink and non-injected,  sclera clear OROPHARYNX:no exudate, no erythema and lips, buccal mucosa, and tongue normal  NECK: supple, thyroid normal size, non-tender, without nodularity LYMPH:  no palpable lymphadenopathy in the cervical, axillary LUNGS: clear to auscultation and percussion with normal breathing effort HEART: regular rate & rhythm and no murmurs and no lower extremity edema ABDOMEN:abdomen soft, non-tender and normal bowel sounds Musculoskeletal:no cyanosis of digits and no clubbing  NEURO: alert & oriented x 3 with fluent speech, no focal motor/sensory deficits  LABORATORY DATA:  I have reviewed the data as listed CBC Latest Ref Rng & Units 02/13/2019 01/30/2019 01/27/2019  WBC 4.0 - 10.5 K/uL 15.6(H) 2.0(L) 4.4  Hemoglobin 12.0 - 15.0 g/dL 10.7(L) 9.1(L) 9.9(L)  Hematocrit 36.0 - 46.0 % 34.0(L) 28.4(L) 30.8(L)  Platelets 150 - 400 K/uL 273 91(L) 153     CMP Latest Ref Rng & Units 02/13/2019 01/30/2019 01/27/2019  Glucose 70 - 99 mg/dL 103(H) 106(H) 102(H)  BUN 6 - 20 mg/dL 20 22(H) 28(H)  Creatinine 0.44 - 1.00 mg/dL 1.19(H) 1.11(H) 1.20(H)  Sodium 135 - 145 mmol/L 139 140 140  Potassium 3.5 - 5.1 mmol/L 3.8 3.9 4.0  Chloride 98 - 111 mmol/L 104 105 102  CO2 22 - 32 mmol/L 27 26 24   Calcium 8.9 - 10.3 mg/dL 9.7 9.4 9.7  Total Protein 6.5 - 8.1 g/dL 8.2(H) 7.8 8.4(H)  Total Bilirubin 0.3 - 1.2 mg/dL 0.3 0.4 0.6  Alkaline Phos 38 - 126 U/L 293(H) 309(H) 284(H)  AST 15 - 41 U/L 46(H) 53(H) 47(H)  ALT 0 - 44 U/L 43 61(H) 46(H)      RADIOGRAPHIC STUDIES: I have personally reviewed the radiological images as listed and agreed with the findings in the report. No results found.   ASSESSMENT & PLAN:  AREEJ TAYLER is a 58 y.o. female with    1. 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 Treated with chemo.She was treated withfirst linechemoFOLFIRINOX with dose reduction. Due to neuropathyand cytopenia, chemo changed tosecond  line 5-FU pump infusion and liposomal irinotecan every 2 weekswith dose reductiondue to cytopenia. -Unfortunately she recently had disease progression withworseningliver mets.  -She started second-line Gemcitabine and Abraxane2 weeks on/1 week offon 09/12/18. She tolerateswell with no significant side effects except hair lossand spiked fever after chemo  -Since recent hospitalization for acute cholecystitis, she has recovered well and gaining weight. Her draining fell out and she has been clinically doing well.  -She has had fever on day 3-5 after chemo, better with prophylactic antibiotics with last dose chemo -Labs reviewed, CBC and CMP show WBC 15.6, ANC 13.4, Hg 10.7, HCT 34.0, MCV 104.3, Creatinine 1.19, AST 46, alkaline phosphatase 293. Overall adeqaute to proceed with treatment today.  She is clinically doing well, will  proceed C8D1 chemo today -f/u next week   2. Weight lossandmalnutrition -Due to chemo and underlying metastatic cancer. -Continue nutritional supplement and f/u with dietician. -Appetite and eating much improved.Weight continues to increase lately   3. Transaminitis -Due to underlying chemo and malignancy -ALT 43, AST 46 today (02/13/19)  4. Pancytopenia, secondary to chemo -Required chemo dose reduction. -S/p blood transfusion as needed with gh <8. Last on 09/20/18 -WBC 15.6, PLT 273,000, Hg at 10.7 (02/13/19)  5. Hypokalemia, CKD Stage III -Sheis currently on KCL supplements -Potassium normalized today, Cr at 1.19 (02/13/19)  6.Peripheral neuropathy, grade 2 -Currently on Neurontin 200 mg at night and B complexes. Continue. -She agreed to use ice bags with Abraxane infusion, continue -stable  7.H/o Acute cholecystitis, status post cholecystectomy tube placement 4/23per IR -she was seen by surgery during herhospitalization, surgery was not recommended due to high risk of complication, and concerns for cancer progression when holding  chemotherapy for surgery -Drain intact,currently has decreased to 51m slightlybloody drainage per day -afebrileand no chills. -Her CT CAP from 12/14/18 shows she has numerus gallstones. Will monitor for any inflammation. -Imaging in 12/2018 consistent with cholecystitis with cystic duct obstruction. No leak on HIDA. Holding intervention for now given she is asymptomatic and invasive procedure would delay chemo.   8. LE DVT -Diagnosed on December 15, 2018, when she was hospitalized for cholecystitis -Due to her bloody biliary drainage, and moderate thrombocytopenia from chemotherapy, she is on low-dose Eliquis163mdaily (no loading dose), she is tolerating well and will continue    9.Goal of care discussion -She is full code now -She understands her cancer is incurable at this stage, and the goal of therapy is palliative, to prolong his life, and prevent cancer related symptoms   Plan -Labsreviewed, will proceed with chemo C8D1 Gem/Abraxane today. -Continue prophylactic Levaquin 500 mg daily on day 2 to 6 -f/u next week  -CT scan last week of July    No problem-specific Assessment & Plan notes found for this encounter.   No orders of the defined types were placed in this encounter.  All questions were answered. The patient knows to call the clinic with any problems, questions or concerns. No barriers to learning was detected. I spent 20 minutes counseling the patient face to face. The total time spent in the appointment was 25 minutes and more than 50% was on counseling and review of test results     YaTruitt MerleMD 02/13/2019   I, AmJoslyn Devonam acting as scribe for YaTruitt MerleMD.   I have reviewed the above documentation for accuracy and completeness, and I agree with the above.

## 2019-02-13 ENCOUNTER — Inpatient Hospital Stay: Payer: 59

## 2019-02-13 ENCOUNTER — Other Ambulatory Visit: Payer: Self-pay

## 2019-02-13 ENCOUNTER — Encounter: Payer: Self-pay | Admitting: Hematology

## 2019-02-13 ENCOUNTER — Inpatient Hospital Stay (HOSPITAL_BASED_OUTPATIENT_CLINIC_OR_DEPARTMENT_OTHER): Payer: 59 | Admitting: Hematology

## 2019-02-13 VITALS — BP 107/71 | HR 87 | Temp 98.7°F | Resp 18 | Ht 65.0 in | Wt 107.0 lb

## 2019-02-13 DIAGNOSIS — T451X5A Adverse effect of antineoplastic and immunosuppressive drugs, initial encounter: Secondary | ICD-10-CM

## 2019-02-13 DIAGNOSIS — N183 Chronic kidney disease, stage 3 unspecified: Secondary | ICD-10-CM

## 2019-02-13 DIAGNOSIS — C787 Secondary malignant neoplasm of liver and intrahepatic bile duct: Secondary | ICD-10-CM

## 2019-02-13 DIAGNOSIS — C251 Malignant neoplasm of body of pancreas: Secondary | ICD-10-CM

## 2019-02-13 DIAGNOSIS — D696 Thrombocytopenia, unspecified: Secondary | ICD-10-CM

## 2019-02-13 DIAGNOSIS — K8043 Calculus of bile duct with acute cholecystitis with obstruction: Secondary | ICD-10-CM

## 2019-02-13 DIAGNOSIS — D6481 Anemia due to antineoplastic chemotherapy: Secondary | ICD-10-CM | POA: Diagnosis not present

## 2019-02-13 DIAGNOSIS — Z5111 Encounter for antineoplastic chemotherapy: Secondary | ICD-10-CM | POA: Diagnosis not present

## 2019-02-13 DIAGNOSIS — C259 Malignant neoplasm of pancreas, unspecified: Secondary | ICD-10-CM | POA: Diagnosis not present

## 2019-02-13 DIAGNOSIS — Z7189 Other specified counseling: Secondary | ICD-10-CM

## 2019-02-13 DIAGNOSIS — Z95828 Presence of other vascular implants and grafts: Secondary | ICD-10-CM

## 2019-02-13 DIAGNOSIS — E876 Hypokalemia: Secondary | ICD-10-CM

## 2019-02-13 LAB — COMPREHENSIVE METABOLIC PANEL
ALT: 43 U/L (ref 0–44)
AST: 46 U/L — ABNORMAL HIGH (ref 15–41)
Albumin: 3.8 g/dL (ref 3.5–5.0)
Alkaline Phosphatase: 293 U/L — ABNORMAL HIGH (ref 38–126)
Anion gap: 8 (ref 5–15)
BUN: 20 mg/dL (ref 6–20)
CO2: 27 mmol/L (ref 22–32)
Calcium: 9.7 mg/dL (ref 8.9–10.3)
Chloride: 104 mmol/L (ref 98–111)
Creatinine, Ser: 1.19 mg/dL — ABNORMAL HIGH (ref 0.44–1.00)
GFR calc Af Amer: 59 mL/min — ABNORMAL LOW (ref >60)
GFR calc non Af Amer: 51 mL/min — ABNORMAL LOW (ref >60)
Glucose, Bld: 103 mg/dL — ABNORMAL HIGH (ref 70–99)
Potassium: 3.8 mmol/L (ref 3.5–5.1)
Sodium: 139 mmol/L (ref 135–145)
Total Bilirubin: 0.3 mg/dL (ref 0.3–1.2)
Total Protein: 8.2 g/dL — ABNORMAL HIGH (ref 6.5–8.1)

## 2019-02-13 LAB — CBC WITH DIFFERENTIAL/PLATELET
Abs Immature Granulocytes: 0.29 10*3/uL — ABNORMAL HIGH (ref 0.00–0.07)
Basophils Absolute: 0.1 10*3/uL (ref 0.0–0.1)
Basophils Relative: 0 %
Eosinophils Absolute: 0.1 10*3/uL (ref 0.0–0.5)
Eosinophils Relative: 0 %
HCT: 34 % — ABNORMAL LOW (ref 36.0–46.0)
Hemoglobin: 10.7 g/dL — ABNORMAL LOW (ref 12.0–15.0)
Immature Granulocytes: 2 %
Lymphocytes Relative: 4 %
Lymphs Abs: 0.7 10*3/uL (ref 0.7–4.0)
MCH: 32.8 pg (ref 26.0–34.0)
MCHC: 31.5 g/dL (ref 30.0–36.0)
MCV: 104.3 fL — ABNORMAL HIGH (ref 80.0–100.0)
Monocytes Absolute: 1.1 10*3/uL — ABNORMAL HIGH (ref 0.1–1.0)
Monocytes Relative: 7 %
Neutro Abs: 13.4 10*3/uL — ABNORMAL HIGH (ref 1.7–7.7)
Neutrophils Relative %: 87 %
Platelets: 273 10*3/uL (ref 150–400)
RBC: 3.26 MIL/uL — ABNORMAL LOW (ref 3.87–5.11)
RDW: 15.9 % — ABNORMAL HIGH (ref 11.5–15.5)
WBC: 15.6 10*3/uL — ABNORMAL HIGH (ref 4.0–10.5)
nRBC: 0 % (ref 0.0–0.2)

## 2019-02-13 MED ORDER — PACLITAXEL PROTEIN-BOUND CHEMO INJECTION 100 MG
100.0000 mg/m2 | Freq: Once | INTRAVENOUS | Status: AC
  Administered 2019-02-13: 150 mg via INTRAVENOUS
  Filled 2019-02-13: qty 30

## 2019-02-13 MED ORDER — SODIUM CHLORIDE 0.9 % IV SOLN
Freq: Once | INTRAVENOUS | Status: AC
  Administered 2019-02-13: 12:00:00 via INTRAVENOUS
  Filled 2019-02-13: qty 250

## 2019-02-13 MED ORDER — SODIUM CHLORIDE 0.9% FLUSH
10.0000 mL | INTRAVENOUS | Status: DC | PRN
  Administered 2019-02-13: 10 mL
  Filled 2019-02-13: qty 10

## 2019-02-13 MED ORDER — PROCHLORPERAZINE MALEATE 10 MG PO TABS
10.0000 mg | ORAL_TABLET | Freq: Once | ORAL | Status: AC
  Administered 2019-02-13: 10 mg via ORAL

## 2019-02-13 MED ORDER — SODIUM CHLORIDE 0.9% FLUSH
10.0000 mL | Freq: Once | INTRAVENOUS | Status: AC
  Administered 2019-02-13: 10 mL
  Filled 2019-02-13: qty 10

## 2019-02-13 MED ORDER — SODIUM CHLORIDE 0.9 % IV SOLN
800.0000 mg/m2 | Freq: Once | INTRAVENOUS | Status: AC
  Administered 2019-02-13: 1178 mg via INTRAVENOUS
  Filled 2019-02-13: qty 30.98

## 2019-02-13 MED ORDER — HEPARIN SOD (PORK) LOCK FLUSH 100 UNIT/ML IV SOLN
500.0000 [IU] | Freq: Once | INTRAVENOUS | Status: AC | PRN
  Administered 2019-02-13: 500 [IU]
  Filled 2019-02-13: qty 5

## 2019-02-13 MED ORDER — PROCHLORPERAZINE MALEATE 10 MG PO TABS
ORAL_TABLET | ORAL | Status: AC
  Filled 2019-02-13: qty 1

## 2019-02-13 MED ORDER — LEVOFLOXACIN 500 MG PO TABS: 500.0000 mg | ORAL_TABLET | Freq: Every day | ORAL | 1 refills | Status: DC

## 2019-02-13 NOTE — Progress Notes (Signed)
Per Dr Burr Medico, Will give gemzar 800mg /m2 and adjust dose in the future based on how blood counts respond.

## 2019-02-13 NOTE — Patient Instructions (Signed)

## 2019-02-13 NOTE — Patient Instructions (Addendum)
[facility] Discharge Instructions for Patients Receiving Chemotherapy  Today you received the following chemotherapy agents: Abraxane, Gemzar   To help prevent nausea and vomiting after your treatment, we encourage you to take your nausea medication as directed.   If you develop nausea and vomiting that is not controlled by your nausea medication, call the clinic.   BELOW ARE SYMPTOMS THAT SHOULD BE REPORTED IMMEDIATELY:  *FEVER GREATER THAN 100.5 F  *CHILLS WITH OR WITHOUT FEVER  NAUSEA AND VOMITING THAT IS NOT CONTROLLED WITH YOUR NAUSEA MEDICATION  *UNUSUAL SHORTNESS OF BREATH  *UNUSUAL BRUISING OR BLEEDING  TENDERNESS IN MOUTH AND THROAT WITH OR WITHOUT PRESENCE OF ULCERS  *URINARY PROBLEMS  *BOWEL PROBLEMS  UNUSUAL RASH Items with * indicate a potential emergency and should be followed up as soon as possible.  Feel free to call the clinic should you have any questions or concerns. The clinic phone number is [phone].  Please show the [facility] at check-in to the Emergency Department and triage nurse.  Coronavirus (COVID-19) Are you at risk?  Are you at risk for the Coronavirus (COVID-19)?  To be considered HIGH RISK for Coronavirus (COVID-19), you have to meet the following criteria:  . Traveled to [place], [place], [place], [place] or [place]; or in the [place] to [place], [place], [place], or [place]; and have fever, cough, and shortness of breath within the last 2 weeks of travel OR . Been in close contact with a person diagnosed with COVID-19 within the last 2 weeks and have fever, cough, and shortness of breath . IF YOU DO NOT MEET THESE CRITERIA, YOU ARE CONSIDERED LOW RISK FOR COVID-19.  What to do if you are HIGH RISK for COVID-19?  Marland Kitchen If you are having a medical emergency, call 911. . Seek medical care right away. Before you go to a doctor's office, urgent care or emergency department, call ahead and tell them about  your recent travel, contact with someone diagnosed with COVID-19, and your symptoms. You should receive instructions from your physician's office regarding next steps of care.  . When you arrive at healthcare provider, tell the healthcare staff immediately you have returned from visiting [place], [place], [place], [place] or [place]; or traveled in the [place] to [place], [place], [employer], or [place]; in the last two weeks or you have been in close contact with a person diagnosed with COVID-19 in the last 2 weeks.   . Tell the health care staff about your symptoms: fever, cough and shortness of breath. . After you have been seen by a medical provider, you will be either: o Tested for (COVID-19) and discharged home on quarantine except to seek medical care if symptoms worsen, and asked to  - Stay home and avoid contact with others until you get your results (4-5 days)  - Avoid travel on public transportation if possible (such as bus, train, or airplane) or o Sent to the Emergency Department by EMS for evaluation, COVID-19 testing, and possible admission depending on your condition and test results.  What to do if you are LOW RISK for COVID-19?  Reduce your risk of any infection by using the same precautions used for avoiding the common cold or flu:  Marland Kitchen Wash your hands often with soap and warm water for at least 20 seconds.  If soap and water are not readily available, use an alcohol-based hand sanitizer with at least 60% alcohol.  . If coughing  or sneezing, cover your mouth and nose by coughing or sneezing into the elbow areas of your shirt or coat, into a tissue or into your sleeve (not your hands). . Avoid shaking hands with others and consider head nods or verbal greetings only. . Avoid touching your eyes, nose, or mouth with unwashed hands.  . Avoid close contact with people who are sick. . Avoid places or events with large numbers of people in one location, like concerts or sporting  events. . Carefully consider travel plans you have or are making. . If you are planning any travel outside or inside the [place], visit the CDC's Travelers' Health webpage for the latest health notices. . If you have some symptoms but not all symptoms, continue to monitor at home and seek medical attention if your symptoms worsen. . If you are having a medical emergency, call 911.   [place] / e-Visit: [email or web address]         [facility]: [facility]: [phone]                   [facility]: [phone]

## 2019-02-13 NOTE — Progress Notes (Signed)
Per Dr Burr Medico, ok to rechallenge abraxane dose of 100mg /m2

## 2019-02-14 ENCOUNTER — Telehealth: Payer: Self-pay | Admitting: Hematology

## 2019-02-14 LAB — CANCER ANTIGEN 19-9: CA 19-9: 48 U/mL — ABNORMAL HIGH (ref 0–35)

## 2019-02-14 NOTE — Telephone Encounter (Signed)
Scheduled appt per 6/22 los. °

## 2019-02-16 NOTE — Progress Notes (Signed)
[place]   [phone] Fax:([phone]   Clinic Follow up Note   Patient Care Team: [name], MD as PCP - General (Family Medicine) [name], MD as Consulting Physician (Otolaryngology) [name], MD as Consulting Physician (Oncology)  Date of Service:  02/20/2019  CHIEF COMPLAINT: F/u of pancreatic cancer   SUMMARY OF ONCOLOGIC HISTORY: Oncology History Overview Note  Cancer Staging Pancreatic cancer [facility]) Staging form: Exocrine Pancreas, AJCC 8th Edition - Clinical stage from 08/06/2017: Stage IV (cTX, cN0, pM1) - Signed by [name], MD on 08/12/2017     Pancreatic cancer ([place])  07/23/2017 Imaging   US abdomen limited RUQ 07/23/17 IMPRESSION: 1. Cholelithiasis.  No secondary signs of acute cholecystitis. 2. Multiple solid liver masses measuring up to 6.5 cm in the left lobe of the liver, evaluation for metastatic disease is recommended. These results will be called to the ordering clinician or representative by the Radiologist Assistant, and communication documented in the PACS or zVision Dashboard.   07/23/2017 Imaging   CT Abdomen W Contrast 07/23/17 IMPRESSION: 1. Widespread metastatic disease throughout the liver. No clear primary malignancy identified in the abdomen. The pelvis was not imaged. Tissue sampling recommended. 2. Probable adenopathy superior to the pancreatic tail. No evidence of pancreatic mass. 3. Suspected incidental hemangioma inferiorly in the right hepatic lobe. 4. Nonspecific nodularity in the breasts. The patient has undergone recent (03/25/2017 and 04/01/2017) mammography and ultrasound.   07/29/2017 Initial Diagnosis   Metastasis to liver of unknown origin ([place])   07/29/2017 PET scan   PET 07/29/17  IMPRESSION: 1. Numerous bulky liver masses are hypermetabolic compatible with malignancy. 2. Accentuated activity within or along the pancreatic tail, likely represent a primary pancreatic tumor.  Consider pancreatic protocol MRI to further work this up. 3. The peripancreatic lymph node shown above the pancreatic tail is mildly hypermetabolic favoring malignancy. 4.  Prominent stool throughout the colon favors constipation. 5. 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   08/05/2017 Pathology Results   Liver Biopsy  Diagnosis 08/05/17 Liver, needle/core biopsy - CARCINOMA. - SEE COMMENT. Microscopic Comment The malignant cells are positive for cytokeratin 7. They are negative for arginase, CDX2, cytokeratin 20, estrogen receptor, GATA-3, GCDFP, Glypican 3, Hep Par 1, Napsin A, and TTF-1. This immunohistochemical is nonspecific. Possibly primary sources include pancreatobiliary and upper gastrointestinal. Radiologic correlation is necessary. Of note, organ specific markers (GATA-3, GCDFP-breast, TTF-1, Napsin A-lung, and CDX2-colon) are negative. (JBK:ecj 08/09/2017)   08/21/2017 - 02/10/2018 Chemotherapy   FOLFIRINOX every 2 weeks starting 08/21/17. Dose reduced due to neuropahty and cytopenia    10/14/2017 Imaging   CT CAP WO Contrast 10/14/17 IMPRESSION: Evidence of known numerous liver metastases without significant interval change. No other evidence of metastatic disease within the chest, abdomen or pelvis. Cholelithiasis. Tiny pericardial effusion.   12/02/2017 Genetic Testing   Negative for pathogenic mutation.  The genes analyzed were the 83 genes on [facility]'s Multi-Cancer panel (ALK, APC, ATM, AXIN2, BAP1, BARD1, BLM, BMPR1A, BRCA1, BRCA2, BRIP1, CASR, CDC73, CDH1, CDK4, CDKN1B, CDKN1C, CDKN2A, CEBPA, CHEK2, CTNNA1, DICER1, DIS3L2, EGFR, EPCAM, FH, FLCN, GATA2, GPC3, GREM1, HOXB13, HRAS, KIT, MAX, MEN1, MET, MITF, MLH1, MSH2, MSH3, MSH6, MUTYH, NBN, NF1, NF2, NTHL1, PALB2, PDGFRA, PHOX2B, PMS2, POLD1, POLE, POT1, PRKAR1A, PTCH1, PTEN, RAD50, RAD51C, RAD51D, RB1, RECQL4, RET, RUNX1, SDHA, SDHAF2, SDHB, SDHC, SDHD, SMAD4, SMARCA4, SMARCB1, SMARCE1, STK11, SUFU, TERC, TERT,  TMEM127, TP53, TSC1, TSC2, VHL, WRN, WT1).   12/17/2017 PET scan   IMPRESSION: 1. Although the liver  metastases are still visible on the CT images, they are significantly smaller and retain no abnormal metabolic activity, consistent with response to therapy. 2. No abnormal activity within the pancreas. 3. No disease progression identified. 4. Cholelithiasis.   02/21/2018 PET scan   02/21/2018 PET Scan  IMPRESSION: 1. There are 2 new small nodules identified within the right lung which measure up to 5 mm. These are too small to reliably characterize by PET-CT, but warrant close interval follow-up. 2. Again noted are multifocal liver metastasis. The target lesion within the left lobe is slightly decreased in size when compared with the previous exam. Similar to previous exam there is no abnormal hypermetabolism above background liver activity identified within the liver lesions. 3. Gallstones.   02/22/2018 - 08/25/2018 Chemotherapy   5-FU pump infusion and liposomal irinotecan, every 2 weeks.  First cycle dose reduced due to cytopenia in the travel. Stopped on 08/25/2018 due to disease progression.    05/17/2018 Imaging   05/17/2018 PET Scan IMPRESSION: 1. Mixed response. The 2 small right lung nodules have decreased in size in the interval. Single focus of increased uptake within the liver is new from 02/21/2018 and concerning for recurrent metabolically active liver metastasis. 2. Splenomegaly.  New from previous exam. 3. Diffusely increased bone marrow activity, likely reflecting treatment related changes.   09/01/2018 PET scan   PET 09/01/18 IMPRESSION: 1. Unfortunately there recurrence of hepatic metastasis with multiple new hypermetabolic lesions in LEFT and RIGHT hepatic lobe. 2. New extra hepatic site of malignancy which appears associated the mid pancreas. Difficult to define lesion on noncontrast exam. 3. Diffuse marrow activity is favored benign. 4. No evidence of pulmonary  metastasis.   09/12/2018 -  Chemotherapy   second-line Gemcitabine and Abraxane for 2 weeks on, 1 week off starting 09/12/2018. Due to neuropathy, I will start her with dose reduced Abraxane.    12/14/2018 Imaging   CT CAP  IMPRESSION: CT demonstrates acute cholecystitis, with choledocholithiasis of the cystic duct.  These results were discussed by telephone at the time of interpretation on 12/14/2018 at 5:05 pm with Dr. Burr Medico.  Redemonstration of known liver metastases, with positive response to therapy, interval reduction in size of all lesions, with multiple demonstrating significant internal necrosis.  No acute finding of the chest.  Ancillary findings as above.   Pancreatic cancer metastasized to liver ([place])  08/11/2017 Initial Diagnosis   Pancreatic cancer metastasized to liver ([place])   09/12/2018 -  Chemotherapy   second-line Gemcitabine and Abraxane for 2 weeks on, 1 week off starting 09/12/2018. Due to neuropathy, I will start her with dose reduced Abraxane.       CURRENT THERAPY:  Second-line Gemcitabine and Abraxane for 2 weeks on, 1 week off starting 09/12/2018. Cycle 5 postponed due to acute cholecystitisand only completed week 1.  INTERVAL HISTORY:  Morgan Dawson is here for a follow up and treatment. She presents to the clinic alone. She notes having fevere with last cycle that lasts intermittently from Monday to Wednesday. 100.7 was the highest it got. She notes having another nodule of her skin erupt more on her arms, now resolved. She denies bleeding, sore throat, cough. She notes being signifcantly fatigued say of infusion but recovered well.  She notes she had 4 episdoes of Virtigo last week and used up her prescriptoin Meclazine, she will conitnue to use OTC motion sickness meds.     REVIEW OF SYSTEMS:   Constitutional: Denies fevers, chills or abnormal weight loss Eyes: Denies  blurriness of vision Ears, nose, mouth, throat, and face: Denies mucositis  or sore throat Respiratory: Denies cough, dyspnea or wheezes Cardiovascular: Denies palpitation, chest discomfort or lower extremity swelling Gastrointestinal:  Denies nausea, heartburn or change in bowel habits Skin: Denies abnormal skin rashes Lymphatics: Denies new lymphadenopathy or easy bruising Neurological:Denies numbness, tingling or new weaknesses Behavioral/Psych: Mood is stable, no new changes  All other systems were reviewed with the patient and are negative.  MEDICAL HISTORY:  Past Medical History:  Diagnosis Date  . Diarrhea of presumed infectious origin 01/12/2018  . Gastroenteritis 01/12/2018  . Genetic testing 12/02/2017   Multi-Cancer panel (83 genes) @ [facility] - No pathogenic mutations detected  . Meniere disease 2016  . Pancreatitis     SURGICAL HISTORY: Past Surgical History:  Procedure Laterality Date  . CERVICAL ABLATION    . ENDOMETRIAL ABLATION  2011  . IR CHOLANGIOGRAM EXISTING TUBE  01/09/2019  . IR FLUORO GUIDE PORT INSERTION RIGHT  08/12/2017  . IR PERC CHOLECYSTOSTOMY  12/15/2018  . IR US GUIDE VASC ACCESS RIGHT  08/12/2017  . [place]    I have reviewed the social history and family history with the patient and they are unchanged from previous note.  ALLERGIES:  has No Known Allergies.  MEDICATIONS:  Current Outpatient Medications  Medication Sig Dispense Refill  . b complex vitamins tablet Take 1 tablet by mouth daily.    Marland Kitchen gabapentin (NEURONTIN) 100 MG capsule TAKE ONE CAPSULE BY MOUTH THREE TIMES DAILY 270 capsule 1  . levofloxacin (LEVAQUIN) 500 MG tablet Take 1 tablet (500 mg total) by mouth daily. Take for 5 days after chemo 10 tablet 1  . lidocaine-prilocaine (EMLA) cream Apply 1 application topically as needed.    . loperamide (IMODIUM) 2 MG capsule Take 1 capsule (2 mg total) by mouth as needed for diarrhea or loose stools. 30 capsule 0  . loratadine (CLARITIN) 10 MG tablet Take 10 mg by mouth daily as needed for  allergies.    . magic mouthwash w/lidocaine SOLN Take 10 mLs by mouth 3 (three) times daily as needed for mouth pain. Swish and spit 10 ML by mouth 3 times daily as needed for mouth pain. 240 mL 0  . meclizine (ANTIVERT) 25 MG tablet Take 25 mg by mouth 3 (three) times daily as needed for dizziness.    . ondansetron (ZOFRAN-ODT) 8 MG disintegrating tablet TAKE 1 TABLET BY MOUTH EVERY 8 HOURS AS NEEDED FOR NAUSE OR VOMITING 40 tablet 0  . potassium chloride (K-DUR) 10 MEQ tablet Take 1 tablet (10 mEq total) by mouth 4 (four) times daily. 360 tablet 2  . prochlorperazine (COMPAZINE) 10 MG tablet Take 1 tablet (10 mg total) by mouth every 6 (six) hours as needed for nausea or vomiting. 40 tablet 2  . PROMACTA 25 MG tablet TAKE 1 TABLET BY MOUTH ONCE DAILY ON AN EMPTY STOMACH  AT LEAST 1 HOUR BEFORE OR 2 HOURS AFTER A MEAL 30 tablet 0  . Rivaroxaban (XARELTO) 15 MG TABS tablet Take 1 tablet (15 mg total) by mouth daily with supper for 30 days. 30 tablet 0  . traMADol (ULTRAM) 50 MG tablet Take 1 tablet (50 mg total) by mouth every 6 (six) hours as needed for moderate pain or severe pain. 30 tablet 0   No current facility-administered medications for this visit.    Facility-Administered Medications Ordered in Other Visits  Medication Dose Route Frequency Provider Last Rate Last Dose  . sodium  chloride flush (NS) 0.9 % injection 10 mL  10 mL Intracatheter Once [name], MD        PHYSICAL EXAMINATION: ECOG PERFORMANCE STATUS: 1 - Symptomatic but completely ambulatory  Vitals:   02/20/19 1015  BP: 124/77  Pulse: 77  Resp: 18  Temp: 97.7 F (36.5 C)  SpO2: 100%   Filed Weights   02/20/19 1015  Weight: 106 lb 1.6 oz (48.1 kg)    GENERAL:alert, no distress and comfortable SKIN: skin color, texture, turgor are normal, no rashes or significant lesions EYES: normal, Conjunctiva are pink and non-injected, sclera clear  NECK: supple, thyroid normal size, non-tender, without nodularity LYMPH:   no palpable lymphadenopathy in the cervical, axillary  LUNGS: clear to auscultation and percussion with normal breathing effort HEART: regular rate & rhythm and no murmurs and no lower extremity edema ABDOMEN:abdomen soft, non-tender and normal bowel sounds Musculoskeletal:no cyanosis of digits and no clubbing  NEURO: alert & oriented x 3 with fluent speech, no focal motor/sensory deficits  LABORATORY DATA:  I have reviewed the data as listed CBC Latest Ref Rng & Units 02/20/2019 02/13/2019 01/30/2019  WBC 4.0 - 10.5 K/uL 5.0 15.6(H) 2.0(L)  Hemoglobin 12.0 - 15.0 g/dL 9.6(L) 10.7(L) 9.1(L)  Hematocrit 36.0 - 46.0 % 29.2(L) 34.0(L) 28.4(L)  Platelets 150 - 400 K/uL 164 273 91(L)     CMP Latest Ref Rng & Units 02/20/2019 02/13/2019 01/30/2019  Glucose 70 - 99 mg/dL 111(H) 103(H) 106(H)  BUN 6 - 20 mg/dL 17 20 22(H)  Creatinine 0.44 - 1.00 mg/dL 1.17(H) 1.19(H) 1.11(H)  Sodium 135 - 145 mmol/L 142 139 140  Potassium 3.5 - 5.1 mmol/L 3.7 3.8 3.9  Chloride 98 - 111 mmol/L 105 104 105  CO2 22 - 32 mmol/L 27 27 26   Calcium 8.9 - 10.3 mg/dL 9.6 9.7 9.4  Total Protein 6.5 - 8.1 g/dL 7.8 8.2(H) 7.8  Total Bilirubin 0.3 - 1.2 mg/dL 0.3 0.3 0.4  Alkaline Phos 38 - 126 U/L 307(H) 293(H) 309(H)  AST 15 - 41 U/L 61(H) 46(H) 53(H)  ALT 0 - 44 U/L 55(H) 43 61(H)      RADIOGRAPHIC STUDIES: I have personally reviewed the radiological images as listed and agreed with the findings in the report. No results found.   ASSESSMENT & PLAN:  Morgan Dawson is a 58 y.o. female with   1. 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 Treated with chemo.She was treated withfirst linechemoFOLFIRINOX with dose reduction. Due to neuropathyand cytopenia, chemo changed tosecond line 5-FU pump infusion and liposomal irinotecan every 2 weekswith dose reductiondue to cytopenia. -Unfortunately she recently had disease progression withworseningliver  mets.  -She started second-line Gemcitabine and Abraxane2 weeks on/1 week offon 09/12/18. She tolerateswell with no significant side effects except hair lossand intermittent nocturnal fever afterchemo -She has had intermittent fever for day 1-4 with chemo cycle 8, even with antibiotics, no other signs of infection, fever resolved spontaneously. She still has skin nodules occasaionlly. I disucssed this is likley related to her chemo, possible drug fever. Will just monitor while on treatment. Will stop Levaquin after she finishes this week. -Labs reviewed, CBC and CMP WNL except Hg 9.6, BG 111, Cr 1.17, AST 61, ALT 55, Alk Phos 307. Overall adeqaute to proceed with C8D8 treatment today. She is clinically doing well, will proceed C8D1 chemo today -Will do next CT scan in last week of July.  -f/u in 2 weeks with NP Regan Rakers  2. Weight lossandmalnutrition -Due to chemo and underlying metastatic cancer. -Continue nutritional supplement and f/u with dietician. -Appetite and eating much improved.Weight continues to increase lately   3. Transaminitis -Due to underlying chemo and malignancy -AST 61, ALT 55, Alk Phos 307 today (02/20/19)  4. Pancytopenia, secondary to chemo -Required chemo dose reduction. -S/p blood transfusion as needed with gh <8. Last on 09/20/18 -On promacta, will conitnue  -WBCnormal,PLT normal,Hg at 9.6(02/20/19)  5. Hypokalemia, CKD Stage III -Sheis currently on KCL supplements -Potassiumnormalized lately. Cr at 1.17 today (02/20/19)  6.Peripheral neuropathy, grade 2 -Currently on Neurontin 200 mg at night and B complexes. Continue. -She agreed to use ice bags with Abraxane infusion, continue -stable  7.H/o Acute cholecystitis, status post cholecystectomy tube placement 4/23per IR -she was seen by surgery during herhospitalization, surgery was not recommended due to high risk of complication, and concerns for cancer progression when holding  chemotherapy for surgery -Drain intact,currently has decreased to 52m slightlybloody drainage per day -afebrileand no chills. -Her CT CAP from 12/14/18 shows she has numerus gallstones. Will monitor for any inflammation. -Imaging in 12/2018 consistent with cholecystitis with cystic duct obstruction. No leak on HIDA. Holding intervention for now given she is asymptomatic and invasive procedure would delay chemo.  8. LE DVT -Diagnosed on December 15, 2018, when she was hospitalized for cholecystitis -Due to her bloody biliary drainage, and moderate thrombocytopenia from chemotherapy, she is on low-dose Eliquis179mdaily (no loading dose), she is tolerating well and will continue    9.Goal of care discussion -She is full code now -She understands her cancer is incurable at this stage, and the goal of therapy is palliative, to prolong his life, and prevent cancer related symptoms   10. Veritgo  -Had 4 mild episodes last week. The frequency is new to her.  -Was on Meclazine, now on OTC motion sickness meds -Wil monitor. If this worsens I recommend she see ENT.    Plan [place] C[place]D8 Gem/Abraxane today at same dose as last week. -Lab, flush, f/u with NP Lacie and chemo in 2 weeks.  -CT scan last week of July    No problem-specific Assessment & Plan notes found for this encounter.   No orders of the defined types were placed in this encounter.  All questions were answered. The patient knows to call the clinic with any problems, questions or concerns. No barriers to learning was detected. I spent 20 minutes counseling the patient face to face. The total time spent in the appointment was 25 minutes and more than 50% was on counseling and review of test results     YaTruitt MerleMD 02/20/2019   I, AmJoslyn Devonam acting as scribe for YaTruitt MerleMD.   I have reviewed the above documentation for accuracy and completeness, and I agree with the above.

## 2019-02-20 ENCOUNTER — Inpatient Hospital Stay: Payer: 59

## 2019-02-20 ENCOUNTER — Inpatient Hospital Stay (HOSPITAL_BASED_OUTPATIENT_CLINIC_OR_DEPARTMENT_OTHER): Payer: 59 | Admitting: Hematology

## 2019-02-20 ENCOUNTER — Other Ambulatory Visit: Payer: Self-pay

## 2019-02-20 ENCOUNTER — Telehealth: Payer: Self-pay | Admitting: Hematology

## 2019-02-20 VITALS — BP 124/77 | HR 77 | Temp 97.7°F | Resp 18 | Ht 65.0 in | Wt 106.1 lb

## 2019-02-20 DIAGNOSIS — Z95828 Presence of other vascular implants and grafts: Secondary | ICD-10-CM

## 2019-02-20 DIAGNOSIS — D6481 Anemia due to antineoplastic chemotherapy: Secondary | ICD-10-CM

## 2019-02-20 DIAGNOSIS — C251 Malignant neoplasm of body of pancreas: Secondary | ICD-10-CM

## 2019-02-20 DIAGNOSIS — G629 Polyneuropathy, unspecified: Secondary | ICD-10-CM

## 2019-02-20 DIAGNOSIS — C259 Malignant neoplasm of pancreas, unspecified: Secondary | ICD-10-CM

## 2019-02-20 DIAGNOSIS — N183 Chronic kidney disease, stage 3 unspecified: Secondary | ICD-10-CM

## 2019-02-20 DIAGNOSIS — Z7189 Other specified counseling: Secondary | ICD-10-CM

## 2019-02-20 DIAGNOSIS — K8043 Calculus of bile duct with acute cholecystitis with obstruction: Secondary | ICD-10-CM | POA: Diagnosis not present

## 2019-02-20 DIAGNOSIS — Z5111 Encounter for antineoplastic chemotherapy: Secondary | ICD-10-CM | POA: Diagnosis not present

## 2019-02-20 DIAGNOSIS — E876 Hypokalemia: Secondary | ICD-10-CM

## 2019-02-20 DIAGNOSIS — C787 Secondary malignant neoplasm of liver and intrahepatic bile duct: Secondary | ICD-10-CM

## 2019-02-20 LAB — COMPREHENSIVE METABOLIC PANEL
ALT: 55 U/L — ABNORMAL HIGH (ref 0–44)
AST: 61 U/L — ABNORMAL HIGH (ref 15–41)
Albumin: 3.6 g/dL (ref 3.5–5.0)
Alkaline Phosphatase: 307 U/L — ABNORMAL HIGH (ref 38–126)
Anion gap: 10 (ref 5–15)
BUN: 17 mg/dL (ref 6–20)
CO2: 27 mmol/L (ref 22–32)
Calcium: 9.6 mg/dL (ref 8.9–10.3)
Chloride: 105 mmol/L (ref 98–111)
Creatinine, Ser: 1.17 mg/dL — ABNORMAL HIGH (ref 0.44–1.00)
GFR calc Af Amer: >60 mL/min (ref >60)
GFR calc non Af Amer: 52 mL/min — ABNORMAL LOW (ref >60)
Glucose, Bld: 111 mg/dL — ABNORMAL HIGH (ref 70–99)
Potassium: 3.7 mmol/L (ref 3.5–5.1)
Sodium: 142 mmol/L (ref 135–145)
Total Bilirubin: 0.3 mg/dL (ref 0.3–1.2)
Total Protein: 7.8 g/dL (ref 6.5–8.1)

## 2019-02-20 LAB — CBC WITH DIFFERENTIAL/PLATELET
Abs Immature Granulocytes: 0.06 10*3/uL (ref 0.00–0.07)
Basophils Absolute: 0 10*3/uL (ref 0.0–0.1)
Basophils Relative: 1 %
Eosinophils Absolute: 0 10*3/uL (ref 0.0–0.5)
Eosinophils Relative: 0 %
HCT: 29.2 % — ABNORMAL LOW (ref 36.0–46.0)
Hemoglobin: 9.6 g/dL — ABNORMAL LOW (ref 12.0–15.0)
Immature Granulocytes: 1 %
Lymphocytes Relative: 13 %
Lymphs Abs: 0.6 10*3/uL — ABNORMAL LOW (ref 0.7–4.0)
MCH: 33.1 pg (ref 26.0–34.0)
MCHC: 32.9 g/dL (ref 30.0–36.0)
MCV: 100.7 fL — ABNORMAL HIGH (ref 80.0–100.0)
Monocytes Absolute: 0.5 10*3/uL (ref 0.1–1.0)
Monocytes Relative: 9 %
Neutro Abs: 3.8 10*3/uL (ref 1.7–7.7)
Neutrophils Relative %: 76 %
Platelets: 164 10*3/uL (ref 150–400)
RBC: 2.9 MIL/uL — ABNORMAL LOW (ref 3.87–5.11)
RDW: 15.3 % (ref 11.5–15.5)
WBC: 5 10*3/uL (ref 4.0–10.5)
nRBC: 0 % (ref 0.0–0.2)

## 2019-02-20 MED ORDER — PEGFILGRASTIM 6 MG/0.6ML ~~LOC~~ PSKT
PREFILLED_SYRINGE | SUBCUTANEOUS | Status: AC
  Filled 2019-02-20: qty 0.6

## 2019-02-20 MED ORDER — HEPARIN SOD (PORK) LOCK FLUSH 100 UNIT/ML IV SOLN
500.0000 [IU] | Freq: Once | INTRAVENOUS | Status: AC | PRN
  Administered 2019-02-20: 14:00:00 500 [IU]
  Filled 2019-02-20: qty 5

## 2019-02-20 MED ORDER — PROCHLORPERAZINE MALEATE 10 MG PO TABS
10.0000 mg | ORAL_TABLET | Freq: Once | ORAL | Status: AC
  Administered 2019-02-20: 10 mg via ORAL

## 2019-02-20 MED ORDER — SODIUM CHLORIDE 0.9 % IV SOLN
Freq: Once | INTRAVENOUS | Status: AC
  Administered 2019-02-20: 11:00:00 via INTRAVENOUS
  Filled 2019-02-20: qty 250

## 2019-02-20 MED ORDER — SODIUM CHLORIDE 0.9% FLUSH
10.0000 mL | INTRAVENOUS | Status: DC | PRN
  Administered 2019-02-20: 10 mL
  Filled 2019-02-20: qty 10

## 2019-02-20 MED ORDER — PEGFILGRASTIM 6 MG/0.6ML ~~LOC~~ PSKT
6.0000 mg | PREFILLED_SYRINGE | Freq: Once | SUBCUTANEOUS | Status: AC
  Administered 2019-02-20: 6 mg via SUBCUTANEOUS

## 2019-02-20 MED ORDER — SODIUM CHLORIDE 0.9 % IV SOLN
800.0000 mg/m2 | Freq: Once | INTRAVENOUS | Status: AC
  Administered 2019-02-20: 1178 mg via INTRAVENOUS
  Filled 2019-02-20: qty 30.98

## 2019-02-20 MED ORDER — PROCHLORPERAZINE MALEATE 10 MG PO TABS
ORAL_TABLET | ORAL | Status: AC
  Filled 2019-02-20: qty 1

## 2019-02-20 MED ORDER — PACLITAXEL PROTEIN-BOUND CHEMO INJECTION 100 MG
100.0000 mg/m2 | Freq: Once | INTRAVENOUS | Status: AC
  Administered 2019-02-20: 150 mg via INTRAVENOUS
  Filled 2019-02-20: qty 30

## 2019-02-20 NOTE — Patient Instructions (Signed)

## 2019-02-20 NOTE — Telephone Encounter (Signed)
Scheduled appt per 6/29 los.

## 2019-02-20 NOTE — Patient Instructions (Addendum)
. [facility] Discharge Instructions for Patients Receiving Chemotherapy  Today you received the following chemotherapy agents: Abraxane, Gemzar   To help prevent nausea and vomiting after your treatment, we encourage you to take your nausea medication as directed.   If you develop nausea and vomiting that is not controlled by your nausea medication, call the clinic.   BELOW ARE SYMPTOMS THAT SHOULD BE REPORTED IMMEDIATELY:  *FEVER GREATER THAN 100.5 F  *CHILLS WITH OR WITHOUT FEVER  NAUSEA AND VOMITING THAT IS NOT CONTROLLED WITH YOUR NAUSEA MEDICATION  *UNUSUAL SHORTNESS OF BREATH  *UNUSUAL BRUISING OR BLEEDING  TENDERNESS IN MOUTH AND THROAT WITH OR WITHOUT PRESENCE OF ULCERS  *URINARY PROBLEMS  *BOWEL PROBLEMS  UNUSUAL RASH Items with * indicate a potential emergency and should be followed up as soon as possible.  Feel free to call the clinic should you have any questions or concerns. The clinic phone number is [phone].  Please show the [facility] at check-in to the Emergency Department and triage nurse.  Coronavirus (COVID-19) Are you at risk?  Are you at risk for the Coronavirus (COVID-19)?  To be considered HIGH RISK for Coronavirus (COVID-19), you have to meet the following criteria:  . Traveled to [place], [place], [place], [place] or [place]; or in the [place] to [place], [place], [place], or [place]; and have fever, cough, and shortness of breath within the last 2 weeks of travel OR . Been in close contact with a person diagnosed with COVID-19 within the last 2 weeks and have fever, cough, and shortness of breath . IF YOU DO NOT MEET THESE CRITERIA, YOU ARE CONSIDERED LOW RISK FOR COVID-19.  What to do if you are HIGH RISK for COVID-19?  Marland Kitchen If you are having a medical emergency, call 911. . Seek medical care right away. Before you go to a doctor's office, urgent care or emergency department, call ahead and tell them  about your recent travel, contact with someone diagnosed with COVID-19, and your symptoms. You should receive instructions from your physician's office regarding next steps of care.  . When you arrive at healthcare provider, tell the healthcare staff immediately you have returned from visiting [place], [place], [place], [place] or [place]; or traveled in the [place] to [place], [place], [place], or [place]; in the last two weeks or you have been in close contact with a person diagnosed with COVID-19 in the last 2 weeks.   . Tell the health care staff about your symptoms: fever, cough and shortness of breath. . After you have been seen by a medical provider, you will be either: o Tested for (COVID-19) and discharged home on quarantine except to seek medical care if symptoms worsen, and asked to  - Stay home and avoid contact with others until you get your results (4-5 days)  - Avoid travel on public transportation if possible (such as bus, train, or airplane) or o Sent to the Emergency Department by EMS for evaluation, COVID-19 testing, and possible admission depending on your condition and test results.  What to do if you are LOW RISK for COVID-19?  Reduce your risk of any infection by using the same precautions used for avoiding the common cold or flu:  Marland Kitchen Wash your hands often with soap and warm water for at least 20 seconds.  If soap and water are not readily available, use an alcohol-based hand sanitizer with at least 60% alcohol.  . If  coughing or sneezing, cover your mouth and nose by coughing or sneezing into the elbow areas of your shirt or coat, into a tissue or into your sleeve (not your hands). . Avoid shaking hands with others and consider head nods or verbal greetings only. . Avoid touching your eyes, nose, or mouth with unwashed hands.  . Avoid close contact with people who are sick. . Avoid places or events with large numbers of people in one location, like concerts or  sporting events. . Carefully consider travel plans you have or are making. . If you are planning any travel outside or inside the [place], visit the CDC's Travelers' Health webpage for the latest health notices. . If you have some symptoms but not all symptoms, continue to monitor at home and seek medical attention if your symptoms worsen. . If you are having a medical emergency, call 911.   ADDITIONAL HEALTHCARE OPTIONS FOR PATIENTS  [facility] / e-Visit: [email or web address]         [facility]: [phone]  [facility]: [phone]                   [facility]: [phone]   

## 2019-02-21 ENCOUNTER — Encounter: Payer: Self-pay | Admitting: Hematology

## 2019-02-26 ENCOUNTER — Other Ambulatory Visit: Payer: Self-pay | Admitting: Nurse Practitioner

## 2019-03-05 NOTE — Progress Notes (Signed)
[place]   Telephone:([phone]) [phone] Fax:(336) [phone]   Clinic Follow up Note   Patient Care Team: [name], MD as PCP - General (Family Medicine) [name], MD as Consulting Physician (Otolaryngology) [name], MD as Consulting Physician (Oncology) 03/06/2019  CHIEF COMPLAINT: f/u pancreas cancer   SUMMARY OF ONCOLOGIC HISTORY: Oncology History Overview Note  Cancer Staging Pancreatic cancer [facility]) Staging form: Exocrine Pancreas, AJCC 8th Edition - Clinical stage from 08/06/2017: Stage IV (cTX, cN0, pM1) - Signed by [name], MD on 08/12/2017     Pancreatic cancer ([place])  07/23/2017 Imaging   US abdomen limited RUQ 07/23/17 IMPRESSION: 1. Cholelithiasis.  No secondary signs of acute cholecystitis. 2. Multiple solid liver masses measuring up to 6.5 cm in the left lobe of the liver, evaluation for metastatic disease is recommended. These results will be called to the ordering clinician or representative by the Radiologist Assistant, and communication documented in the PACS or zVision Dashboard.   07/23/2017 Imaging   CT Abdomen W Contrast 07/23/17 IMPRESSION: 1. Widespread metastatic disease throughout the liver. No clear primary malignancy identified in the abdomen. The pelvis was not imaged. Tissue sampling recommended. 2. Probable adenopathy superior to the pancreatic tail. No evidence of pancreatic mass. 3. Suspected incidental hemangioma inferiorly in the right hepatic lobe. 4. Nonspecific nodularity in the breasts. The patient has undergone recent (03/25/2017 and 04/01/2017) mammography and ultrasound.   07/29/2017 Initial Diagnosis   Metastasis to liver of unknown origin ([place])   07/29/2017 PET scan   PET 07/29/17  IMPRESSION: 1. Numerous bulky liver masses are hypermetabolic compatible with malignancy. 2. Accentuated activity within or along the pancreatic tail, likely represent a primary pancreatic tumor. Consider pancreatic  protocol MRI to further work this up. 3. The peripancreatic lymph node shown above the pancreatic tail is mildly hypermetabolic favoring malignancy. 4.  Prominent stool throughout the colon favors constipation. 5. 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   08/05/2017 Pathology Results   Liver Biopsy  Diagnosis 08/05/17 Liver, needle/core biopsy - CARCINOMA. - SEE COMMENT. Microscopic Comment The malignant cells are positive for cytokeratin 7. They are negative for arginase, CDX2, cytokeratin 20, estrogen receptor, GATA-3, GCDFP, Glypican 3, Hep Par 1, Napsin A, and TTF-1. This immunohistochemical is nonspecific. Possibly primary sources include pancreatobiliary and upper gastrointestinal. Radiologic correlation is necessary. Of note, organ specific markers (GATA-3, GCDFP-breast, TTF-1, Napsin A-lung, and CDX2-colon) are negative. (JBK:ecj 08/09/2017)   08/21/2017 - 02/10/2018 Chemotherapy   FOLFIRINOX every 2 weeks starting 08/21/17. Dose reduced due to neuropahty and cytopenia    10/14/2017 Imaging   CT CAP WO Contrast 10/14/17 IMPRESSION: Evidence of known numerous liver metastases without significant interval change. No other evidence of metastatic disease within the chest, abdomen or pelvis. Cholelithiasis. Tiny pericardial effusion.   12/02/2017 Genetic Testing   Negative for pathogenic mutation.  The genes analyzed were the 83 genes on [facility]'s Multi-Cancer panel (ALK, APC, ATM, AXIN2, BAP1, BARD1, BLM, BMPR1A, BRCA1, BRCA2, BRIP1, CASR, CDC73, CDH1, CDK4, CDKN1B, CDKN1C, CDKN2A, CEBPA, CHEK2, CTNNA1, DICER1, DIS3L2, EGFR, EPCAM, FH, FLCN, GATA2, GPC3, GREM1, HOXB13, HRAS, KIT, MAX, MEN1, MET, MITF, MLH1, MSH2, MSH3, MSH6, MUTYH, NBN, NF1, NF2, NTHL1, PALB2, PDGFRA, PHOX2B, PMS2, POLD1, POLE, POT1, PRKAR1A, PTCH1, PTEN, RAD50, RAD51C, RAD51D, RB1, RECQL4, RET, RUNX1, SDHA, SDHAF2, SDHB, SDHC, SDHD, SMAD4, SMARCA4, SMARCB1, SMARCE1, STK11, SUFU, TERC, TERT, TMEM127, TP53, TSC1, TSC2,  VHL, WRN, WT1).   12/17/2017 PET scan   IMPRESSION: 1. Although the liver metastases are still visible on the  CT images, they are significantly smaller and retain no abnormal metabolic activity, consistent with response to therapy. 2. No abnormal activity within the pancreas. 3. No disease progression identified. 4. Cholelithiasis.   02/21/2018 PET scan   02/21/2018 PET Scan  IMPRESSION: 1. There are 2 new small nodules identified within the right lung which measure up to 5 mm. These are too small to reliably characterize by PET-CT, but warrant close interval follow-up. 2. Again noted are multifocal liver metastasis. The target lesion within the left lobe is slightly decreased in size when compared with the previous exam. Similar to previous exam there is no abnormal hypermetabolism above background liver activity identified within the liver lesions. 3. Gallstones.   02/22/2018 - 08/25/2018 Chemotherapy   5-FU pump infusion and liposomal irinotecan, every 2 weeks.  First cycle dose reduced due to cytopenia in the travel. Stopped on 08/25/2018 due to disease progression.    05/17/2018 Imaging   05/17/2018 PET Scan IMPRESSION: 1. Mixed response. The 2 small right lung nodules have decreased in size in the interval. Single focus of increased uptake within the liver is new from 02/21/2018 and concerning for recurrent metabolically active liver metastasis. 2. Splenomegaly.  New from previous exam. 3. Diffusely increased bone marrow activity, likely reflecting treatment related changes.   09/01/2018 PET scan   PET 09/01/18 IMPRESSION: 1. Unfortunately there recurrence of hepatic metastasis with multiple new hypermetabolic lesions in LEFT and RIGHT hepatic lobe. 2. New extra hepatic site of malignancy which appears associated the mid pancreas. Difficult to define lesion on noncontrast exam. 3. Diffuse marrow activity is favored benign. 4. No evidence of pulmonary metastasis.   09/12/2018 -   Chemotherapy   second-line Gemcitabine and Abraxane for 2 weeks on, 1 week off starting 09/12/2018. Due to neuropathy, I will start her with dose reduced Abraxane.    12/14/2018 Imaging   CT CAP  IMPRESSION: CT demonstrates acute cholecystitis, with choledocholithiasis of the cystic duct.  These results were discussed by telephone at the time of interpretation on 12/14/2018 at 5:05 pm with Dr. Burr Medico.  Redemonstration of known liver metastases, with positive response to therapy, interval reduction in size of all lesions, with multiple demonstrating significant internal necrosis.  No acute finding of the chest.  Ancillary findings as above.   Pancreatic cancer metastasized to liver ([place])  08/11/2017 Initial Diagnosis   Pancreatic cancer metastasized to liver ([place])   09/12/2018 -  Chemotherapy   second-line Gemcitabine and Abraxane for 2 weeks on, 1 week off starting 09/12/2018. Due to neuropathy, I will start her with dose reduced Abraxane.      CURRENT THERAPY: Second-line Gemcitabine and Abraxane for 2 weeks on, 1 week off starting 09/12/2018. Cycle 5 postponed due to acute cholecystitisand only completed week 1.  INTERVAL HISTORY: Ms. swiney returns for f/u and treatment as scheduled. She completed cycle 8 day 8 with onpro on 02/20/19.  She had intermittent fever for 2 days after treatment, T-max 100.2.  Denies chills.  3 days ago she woke up with a "crick in her neck" on the left neck, shoulder, and base of head.  She has not tried medication, only heat.  Pain is improved today.  She also notes mild epigastric and left upper quadrant discomfort that began 5 days to 1 week ago.  Discomfort is more noticeable after a meal and "when I have bad posture."  Pain is a constant dull ache, intensity fluctuates.  Denies nausea/vomiting.  Stools at baseline.  She had more mucositis  this cycle which resolved.  Did not try Magic mouthwash.  Did not limit p.o. intake. Neuropathy is stable, mostly in  her feet. Denies cough, chest pain, dyspnea, or leg edema.    MEDICAL HISTORY:  Past Medical History:  Diagnosis Date   Diarrhea of presumed infectious origin 01/12/2018   Gastroenteritis 01/12/2018   Genetic testing 12/02/2017   Multi-Cancer panel (83 genes) @ [facility] - No pathogenic mutations detected   Meniere disease 2016   Pancreatitis     SURGICAL HISTORY: Past Surgical History:  Procedure Laterality Date   CERVICAL ABLATION     ENDOMETRIAL ABLATION  2011   IR CHOLANGIOGRAM EXISTING TUBE  01/09/2019   IR FLUORO GUIDE PORT INSERTION RIGHT  08/12/2017   IR PERC CHOLECYSTOSTOMY  12/15/2018   IR US GUIDE VASC ACCESS RIGHT  08/12/2017   MANDIBLE SURGERY  1999    I have reviewed the social history and family history with the patient and they are unchanged from previous note.  ALLERGIES:  has No Known Allergies.  MEDICATIONS:  Current Outpatient Medications  Medication Sig Dispense Refill   b complex vitamins tablet Take 1 tablet by mouth daily.     gabapentin (NEURONTIN) 100 MG capsule TAKE ONE CAPSULE BY MOUTH THREE TIMES DAILY 270 capsule 1   lidocaine-prilocaine (EMLA) cream Apply 1 application topically as needed.     loperamide (IMODIUM) 2 MG capsule Take 1 capsule (2 mg total) by mouth as needed for diarrhea or loose stools. 30 capsule 0   loratadine (CLARITIN) 10 MG tablet Take 10 mg by mouth daily as needed for allergies.     magic mouthwash w/lidocaine SOLN Take 10 mLs by mouth 3 (three) times daily as needed for mouth pain. Swish and spit 10 ML by mouth 3 times daily as needed for mouth pain. 240 mL 0   meclizine (ANTIVERT) 25 MG tablet Take 25 mg by mouth 3 (three) times daily as needed for dizziness.     ondansetron (ZOFRAN-ODT) 8 MG disintegrating tablet TAKE 1 TABLET BY MOUTH EVERY 8 HOURS AS NEEDED FOR NAUSE OR VOMITING 40 tablet 0   potassium chloride (K-DUR) 10 MEQ tablet Take 1 tablet (10 mEq total) by mouth 4 (four) times daily. 360 tablet  2   prochlorperazine (COMPAZINE) 10 MG tablet Take 1 tablet (10 mg total) by mouth every 6 (six) hours as needed for nausea or vomiting. 40 tablet 2   PROMACTA 25 MG tablet TAKE 1 TABLET BY MOUTH ONCE DAILY ON AN EMPTY STOMACH  AT LEAST 1 HOUR BEFORE OR 2 HOURS AFTER A MEAL 30 tablet 0   traMADol (ULTRAM) 50 MG tablet Take 1 tablet (50 mg total) by mouth every 6 (six) hours as needed for moderate pain or severe pain. 30 tablet 0   XARELTO 15 MG TABS tablet TAKE 1 TABLET(15 MG) BY MOUTH DAILY WITH SUPPER 30 tablet 0   cyclobenzaprine (FLEXERIL) 5 MG tablet Take 1 tablet (5 mg total) by mouth 3 (three) times daily as needed for muscle spasms. 30 tablet 0   levofloxacin (LEVAQUIN) 500 MG tablet Take 1 tablet (500 mg total) by mouth daily. Take for 5 days after chemo (Patient not taking: Reported on 03/06/2019) 10 tablet 1   No current facility-administered medications for this visit.    Facility-Administered Medications Ordered in Other Visits  Medication Dose Route Frequency Provider Last Rate Last Dose   gemcitabine (GEMZAR) 1,178 mg in sodium chloride 0.9 % 250 mL chemo infusion  800 mg/m2 (Treatment  Plan Recorded) Intravenous Once [name], MD       heparin lock flush 100 unit/mL  500 Units Intracatheter Once PRN [name], MD       PACLitaxel-protein bound (ABRAXANE) chemo infusion 150 mg  100 mg/m2 (Treatment Plan Recorded) Intravenous Once [name], MD 60 mL/hr at 03/06/19 1222 150 mg at 03/06/19 1222   sodium chloride flush (NS) 0.9 % injection 10 mL  10 mL Intracatheter Once [name], MD       sodium chloride flush (NS) 0.9 % injection 10 mL  10 mL Intracatheter PRN [name], MD        PHYSICAL EXAMINATION: ECOG PERFORMANCE STATUS: 1 - Symptomatic but completely ambulatory  Vitals:   03/06/19 0933  BP: 118/79  Pulse: 85  Resp: 18  Temp: 99.1 F (37.3 C)  SpO2: 100%   Filed Weights   03/06/19 0933  Weight: 106 lb 9.6 oz (48.4 kg)    GENERAL:alert, no distress  and comfortable SKIN:  no rash. Midline bruise on her spine  EYES: sclera clear OROPHARYNX:no thrush or ulcers NECK: without palpable mass, fullness/tightness noted to left lower neck  LYMPH:  no palpable cervical or supraclavicular LN LUNGS: clear to auscultation with normal breathing effort HEART: regular rate & rhythm ABDOMEN:abdomen soft, normal bowel sounds. Mild epigastric tenderness. No palpable mass.  Musculoskeletal:no cyanosis of digits.  NEURO: alert & oriented x 3 with fluent speech, normal gait PAC without erythema   LABORATORY DATA:  I have reviewed the data as listed CBC Latest Ref Rng & Units 03/06/2019 02/20/2019 02/13/2019  WBC 4.0 - 10.5 K/uL 21.5(H) 5.0 15.6(H)  Hemoglobin 12.0 - 15.0 g/dL 10.4(L) 9.6(L) 10.7(L)  Hematocrit 36.0 - 46.0 % 31.3(L) 29.2(L) 34.0(L)  Platelets 150 - 400 K/uL 278 164 273     CMP Latest Ref Rng & Units 03/06/2019 02/20/2019 02/13/2019  Glucose 70 - 99 mg/dL 110(H) 111(H) 103(H)  BUN 6 - 20 mg/dL 19 17 20   Creatinine 0.44 - 1.00 mg/dL 1.16(H) 1.17(H) 1.19(H)  Sodium 135 - 145 mmol/L 140 142 139  Potassium 3.5 - 5.1 mmol/L 3.5 3.7 3.8  Chloride 98 - 111 mmol/L 103 105 104  CO2 22 - 32 mmol/L 26 27 27   Calcium 8.9 - 10.3 mg/dL 9.7 9.6 9.7  Total Protein 6.5 - 8.1 g/dL 8.1 7.8 8.2(H)  Total Bilirubin 0.3 - 1.2 mg/dL 0.5 0.3 0.3  Alkaline Phos 38 - 126 U/L 394(H) 307(H) 293(H)  AST 15 - 41 U/L 55(H) 61(H) 46(H)  ALT 0 - 44 U/L 53(H) 55(H) 43      RADIOGRAPHIC STUDIES: I have personally reviewed the radiological images as listed and agreed with the findings in the report. No results found.   ASSESSMENT & PLAN: ADISYN RUSCITTI a 58 y.o.femalewith   1. 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 She was treated withfirst linechemoFOLFIRINOX with dose reduction. Due to neuropathyand cytopenia, chemo changed tosecond line 5-FU pump infusion and liposomal irinotecan  every 2 weekswith dose reduction -Unfortunately she had disease progression withworseningliver mets.  -She started second-line Gemcitabine and Abraxane2 weeks on/1 week offon 09/12/18. She tolerateswell with no significant side effects except hair lossand spiked fever afterchemo -she developedacute cholecystitisand was hospitalized, s/p perc chole drain; she recovered well and resumed chemotherapy. After cycle 6 day 1 (01/02/19) she developed recurrent fever spikes and pancytopenia. Treatment was held for possible recurrent infection on 5/18. -Drain exchange was attempted in IR on  5/18 but drain fell out. Her case was discussed with IR and surgery. She had virtual visit with Dr. Donne Hazel. Surgery not recommended due to concern for disease progression while off chemo  -she is at high risk for recurrent infection on chemotherapy; we reviewed signs of recurrent acute cholecystitis -she resumed chemotherapy cycle 7 on 6/1 with dose-reduced gemcitabine and abraxane; she developed recurrent fever next day after treatment, up to 101.4. she was seen in clinic on 6/5. Abdominal imaging did not indicate gallbladder drainage at this time.  -Onpro added with cycle 7 -Ms. Dority appears well today. She tolerated cycle 8 chemo with intermittent low grade fever Tmax 100.2. she is otherwise asymptomatic of infection.  -Labs reviewed. WBC 21, likely due to Onpro. Otherwise adequate for treatment.  -She developed epigastric and LUQ discomfort 1 week ago and muscular neck pain recently. she feels this is different from her acute cholecystitis episodes. Otherwise she is doing well. I recommend heat, tylenol PRN and flexeril TID PRN for neck pain. I sent prescription. There is some fullness in the neck without palpable LN. Will obtain restaging CT in next 1-2 weeks and include neck -Proceed with cycle 9 gemcitabine and abraxane today, no dosage adjustments  -return in 1 week for day 8 chemo  -f/u in 3  weeks  2. Weight lossandmalnutrition -Due to chemo and underlying metastatic cancer. -she uses supplements -stable  3. Transaminitis -Due to underlying chemo and malignancy -ALT/AST, alkphos elevated but stable. Normal Tbili -monitoring   4. Pancytopenia, secondary to chemo -cycle 6 day 8 chemo was held on 5/18 for ANC 0.9 and plt 63K -she resumed chemotherapy with dose-reductions on 6/1, -Onpro added with cycle 7 -WBC elevated today, likely due to Onpro. May remove on day 8 pending labs next week. Will follow   5. Hypokalemia, CKD Stage III -Sheis currently on KCL supplements -Cr 1.16, normal K today  6.Peripheral neuropathy, grade 2 -Currently on Neurontin 200 mg at night and B complexes. Continue. -stable overall, mostly in her feet  -monitoring closely  7.Acute cholecystitis, status post cholecystectomy tube placement 4/23per IR - surgery was not recommended due to high risk of complication, and concerns for cancer progression when holding chemotherapy for surgery -Drainwas placed during hospitalization, withslightlybloody drainagedaily -Her CT CAP from 12/14/18 shows she has numerus gallstones. -Drain fell out on 5/18; imaging in 12/2018 consistent with cholecystitis with cystic duct obstruction. No leak on HIDA. Holding intervention for now given she is asymptomatic and invasive procedure would delay chemo.  -[place] on 6/5 was done due to recurrent fever after resuming chemo; no indication for gallbladder drainage at this time -we are monitoring closely  8. LE DVT -Diagnosed on December 15, 2018, when she was hospitalized for cholecystitis -Due to her bloody biliary drainage, and moderate thrombocytopenia from chemotherapy, she is on low-dosexarelto75m daily (no loading dose) -denies bleeding  9.Goal of care discussion -She is full code now -treatment goal is palliative  PLAN: -Labs reviewed, proceed with cycle 9 gemcitabine and abraxane day 1  today -Return for day 8 next week  -Consider d/c Onpro depending on day 8 labs  -Restage after this cycle  -Rx: flexeril TID PRN for left neck pain    Orders Placed This Encounter  Procedures   CT Abdomen Pelvis W Contrast    Standing Status:   Future    Standing Expiration Date:   03/05/2020    Order Specific Question:   If indicated for the ordered procedure, I authorize the administration  of contrast media per Radiology protocol    Answer:   Yes    Order Specific Question:   Is patient pregnant?    Answer:   No    Order Specific Question:   Preferred imaging location?    Answer:   [facility]    Order Specific Question:   Is Oral Contrast requested for this exam?    Answer:   Yes, Per Radiology protocol    Order Specific Question:   Radiology Contrast Protocol - do NOT remove file path    Answer:   [email or web address]   CT Chest W Contrast    Please include neck for recent left muscle ache and fullness noted on exam    Standing Status:   Future    Standing Expiration Date:   03/05/2020    Order Specific Question:   If indicated for the ordered procedure, I authorize the administration of contrast media per Radiology protocol    Answer:   Yes    Order Specific Question:   Is patient pregnant?    Answer:   No    Order Specific Question:   Preferred imaging location?    Answer:   [facility]    Order Specific Question:   Radiology Contrast Protocol - do NOT remove file path    Answer:   \[email or web address]   All questions were answered. The patient knows to call the clinic with any problems, questions or concerns. No barriers to learning was detected.     Alla Feeling, NP 03/06/19

## 2019-03-06 ENCOUNTER — Inpatient Hospital Stay (HOSPITAL_BASED_OUTPATIENT_CLINIC_OR_DEPARTMENT_OTHER): Payer: 59 | Admitting: Nurse Practitioner

## 2019-03-06 ENCOUNTER — Inpatient Hospital Stay: Payer: 59

## 2019-03-06 ENCOUNTER — Telehealth: Payer: Self-pay | Admitting: Nurse Practitioner

## 2019-03-06 ENCOUNTER — Other Ambulatory Visit: Payer: Self-pay | Admitting: Hematology

## 2019-03-06 ENCOUNTER — Other Ambulatory Visit: Payer: Self-pay

## 2019-03-06 ENCOUNTER — Telehealth: Payer: Self-pay | Admitting: *Deleted

## 2019-03-06 ENCOUNTER — Encounter: Payer: Self-pay | Admitting: Nurse Practitioner

## 2019-03-06 ENCOUNTER — Inpatient Hospital Stay: Payer: 59 | Attending: Hematology

## 2019-03-06 VITALS — BP 118/79 | HR 85 | Temp 99.1°F | Resp 18 | Ht 65.0 in | Wt 106.6 lb

## 2019-03-06 DIAGNOSIS — C259 Malignant neoplasm of pancreas, unspecified: Secondary | ICD-10-CM

## 2019-03-06 DIAGNOSIS — C787 Secondary malignant neoplasm of liver and intrahepatic bile duct: Secondary | ICD-10-CM

## 2019-03-06 DIAGNOSIS — R634 Abnormal weight loss: Secondary | ICD-10-CM

## 2019-03-06 DIAGNOSIS — G620 Drug-induced polyneuropathy: Secondary | ICD-10-CM

## 2019-03-06 DIAGNOSIS — N183 Chronic kidney disease, stage 3 (moderate): Secondary | ICD-10-CM

## 2019-03-06 DIAGNOSIS — C251 Malignant neoplasm of body of pancreas: Secondary | ICD-10-CM

## 2019-03-06 DIAGNOSIS — Z5111 Encounter for antineoplastic chemotherapy: Secondary | ICD-10-CM | POA: Diagnosis not present

## 2019-03-06 DIAGNOSIS — D61810 Antineoplastic chemotherapy induced pancytopenia: Secondary | ICD-10-CM

## 2019-03-06 DIAGNOSIS — D696 Thrombocytopenia, unspecified: Secondary | ICD-10-CM

## 2019-03-06 DIAGNOSIS — E46 Unspecified protein-calorie malnutrition: Secondary | ICD-10-CM

## 2019-03-06 DIAGNOSIS — I82402 Acute embolism and thrombosis of unspecified deep veins of left lower extremity: Secondary | ICD-10-CM

## 2019-03-06 DIAGNOSIS — D490 Neoplasm of unspecified behavior of digestive system: Secondary | ICD-10-CM

## 2019-03-06 DIAGNOSIS — Z95828 Presence of other vascular implants and grafts: Secondary | ICD-10-CM

## 2019-03-06 DIAGNOSIS — Z79899 Other long term (current) drug therapy: Secondary | ICD-10-CM

## 2019-03-06 DIAGNOSIS — E876 Hypokalemia: Secondary | ICD-10-CM

## 2019-03-06 DIAGNOSIS — M791 Myalgia, unspecified site: Secondary | ICD-10-CM

## 2019-03-06 DIAGNOSIS — Z7189 Other specified counseling: Secondary | ICD-10-CM

## 2019-03-06 DIAGNOSIS — R740 Nonspecific elevation of levels of transaminase and lactic acid dehydrogenase [LDH]: Secondary | ICD-10-CM

## 2019-03-06 DIAGNOSIS — K8043 Calculus of bile duct with acute cholecystitis with obstruction: Secondary | ICD-10-CM

## 2019-03-06 LAB — COMPREHENSIVE METABOLIC PANEL
ALT: 53 U/L — ABNORMAL HIGH (ref 0–44)
AST: 55 U/L — ABNORMAL HIGH (ref 15–41)
Albumin: 3.8 g/dL (ref 3.5–5.0)
Alkaline Phosphatase: 394 U/L — ABNORMAL HIGH (ref 38–126)
Anion gap: 11 (ref 5–15)
BUN: 19 mg/dL (ref 6–20)
CO2: 26 mmol/L (ref 22–32)
Calcium: 9.7 mg/dL (ref 8.9–10.3)
Chloride: 103 mmol/L (ref 98–111)
Creatinine, Ser: 1.16 mg/dL — ABNORMAL HIGH (ref 0.44–1.00)
GFR calc Af Amer: >60 mL/min (ref >60)
GFR calc non Af Amer: 53 mL/min — ABNORMAL LOW (ref >60)
Glucose, Bld: 110 mg/dL — ABNORMAL HIGH (ref 70–99)
Potassium: 3.5 mmol/L (ref 3.5–5.1)
Sodium: 140 mmol/L (ref 135–145)
Total Bilirubin: 0.5 mg/dL (ref 0.3–1.2)
Total Protein: 8.1 g/dL (ref 6.5–8.1)

## 2019-03-06 LAB — CBC WITH DIFFERENTIAL/PLATELET
Abs Immature Granulocytes: 0.65 10*3/uL — ABNORMAL HIGH (ref 0.00–0.07)
Basophils Absolute: 0.1 10*3/uL (ref 0.0–0.1)
Basophils Relative: 1 %
Eosinophils Absolute: 0.1 10*3/uL (ref 0.0–0.5)
Eosinophils Relative: 0 %
HCT: 31.3 % — ABNORMAL LOW (ref 36.0–46.0)
Hemoglobin: 10.4 g/dL — ABNORMAL LOW (ref 12.0–15.0)
Immature Granulocytes: 3 %
Lymphocytes Relative: 4 %
Lymphs Abs: 0.9 10*3/uL (ref 0.7–4.0)
MCH: 33.1 pg (ref 26.0–34.0)
MCHC: 33.2 g/dL (ref 30.0–36.0)
MCV: 99.7 fL (ref 80.0–100.0)
Monocytes Absolute: 1.7 10*3/uL — ABNORMAL HIGH (ref 0.1–1.0)
Monocytes Relative: 8 %
Neutro Abs: 18.1 10*3/uL — ABNORMAL HIGH (ref 1.7–7.7)
Neutrophils Relative %: 84 %
Platelets: 278 10*3/uL (ref 150–400)
RBC: 3.14 MIL/uL — ABNORMAL LOW (ref 3.87–5.11)
RDW: 17.8 % — ABNORMAL HIGH (ref 11.5–15.5)
WBC: 21.5 10*3/uL — ABNORMAL HIGH (ref 4.0–10.5)
nRBC: 0.1 % (ref 0.0–0.2)

## 2019-03-06 MED ORDER — SODIUM CHLORIDE 0.9% FLUSH
10.0000 mL | Freq: Once | INTRAVENOUS | Status: AC
  Administered 2019-03-06: 10 mL
  Filled 2019-03-06: qty 10

## 2019-03-06 MED ORDER — PROCHLORPERAZINE MALEATE 10 MG PO TABS
10.0000 mg | ORAL_TABLET | Freq: Once | ORAL | Status: AC
  Administered 2019-03-06: 11:00:00 10 mg via ORAL

## 2019-03-06 MED ORDER — PROCHLORPERAZINE MALEATE 10 MG PO TABS
ORAL_TABLET | ORAL | Status: AC
  Filled 2019-03-06: qty 1

## 2019-03-06 MED ORDER — SODIUM CHLORIDE 0.9% FLUSH
10.0000 mL | INTRAVENOUS | Status: DC | PRN
  Administered 2019-03-06: 10 mL
  Filled 2019-03-06: qty 10

## 2019-03-06 MED ORDER — SODIUM CHLORIDE 0.9 % IV SOLN
Freq: Once | INTRAVENOUS | Status: AC
  Administered 2019-03-06: 11:00:00 via INTRAVENOUS
  Filled 2019-03-06: qty 250

## 2019-03-06 MED ORDER — CYCLOBENZAPRINE HCL 5 MG PO TABS: 5.0000 mg | ORAL_TABLET | Freq: Three times a day (TID) | ORAL | 0 refills | Status: AC | PRN

## 2019-03-06 MED ORDER — SODIUM CHLORIDE 0.9 % IV SOLN
800.0000 mg/m2 | Freq: Once | INTRAVENOUS | Status: AC
  Administered 2019-03-06: 13:00:00 1178 mg via INTRAVENOUS
  Filled 2019-03-06: qty 30.98

## 2019-03-06 MED ORDER — PACLITAXEL PROTEIN-BOUND CHEMO INJECTION 100 MG
100.0000 mg/m2 | Freq: Once | INTRAVENOUS | Status: AC
  Administered 2019-03-06: 12:00:00 150 mg via INTRAVENOUS
  Filled 2019-03-06: qty 30

## 2019-03-06 MED ORDER — HEPARIN SOD (PORK) LOCK FLUSH 100 UNIT/ML IV SOLN
500.0000 [IU] | Freq: Once | INTRAVENOUS | Status: AC | PRN
  Administered 2019-03-06: 500 [IU]
  Filled 2019-03-06: qty 5

## 2019-03-06 NOTE — Telephone Encounter (Signed)
TCT patient regarding CT scan schedule.  Spoke with patient. Advised that her scan is scheduled for 03/15/19 @ 9:45 at [facility] Radiology dept. Advised that her oral contrast would be available for pick up @ the front desk of the cancer center with instructions included. Pt voiced understanding and will pick up contrast as directed.

## 2019-03-06 NOTE — Telephone Encounter (Signed)
Scheduled appt per 7/13 los. °

## 2019-03-06 NOTE — Patient Instructions (Signed)
[facility] Discharge Instructions for Patients Receiving Chemotherapy  Today you received the following chemotherapy agents Paclitaxel-Protein (ABRAXANE) & Gemcitabine (GEMZAR).  To help prevent nausea and vomiting after your treatment, we encourage you to take your nausea medication as prescribed.   If you develop nausea and vomiting that is not controlled by your nausea medication, call the clinic.   BELOW ARE SYMPTOMS THAT SHOULD BE REPORTED IMMEDIATELY:  *FEVER GREATER THAN 100.5 F  *CHILLS WITH OR WITHOUT FEVER  NAUSEA AND VOMITING THAT IS NOT CONTROLLED WITH YOUR NAUSEA MEDICATION  *UNUSUAL SHORTNESS OF BREATH  *UNUSUAL BRUISING OR BLEEDING  TENDERNESS IN MOUTH AND THROAT WITH OR WITHOUT PRESENCE OF ULCERS  *URINARY PROBLEMS  *BOWEL PROBLEMS  UNUSUAL RASH Items with * indicate a potential emergency and should be followed up as soon as possible.  Feel free to call the clinic should you have any questions or concerns. The clinic phone number is [phone].  Please show the [facility] at check-in to the Emergency Department and triage nurse.  Coronavirus (COVID-19) Are you at risk?  Are you at risk for the Coronavirus (COVID-19)?  To be considered HIGH RISK for Coronavirus (COVID-19), you have to meet the following criteria:  . Traveled to [place], [place], [place], [place] or [place]; or in the [place] to [place], [place], [place], or [place]; and have fever, cough, and shortness of breath within the last 2 weeks of travel OR . Been in close contact with a person diagnosed with COVID-19 within the last 2 weeks and have fever, cough, and shortness of breath . IF YOU DO NOT MEET THESE CRITERIA, YOU ARE CONSIDERED LOW RISK FOR COVID-19.  What to do if you are HIGH RISK for COVID-19?  Marland Kitchen If you are having a medical emergency, call 911. . Seek medical care right away. Before you go to a doctor's office, urgent care or emergency  department, call ahead and tell them about your recent travel, contact with someone diagnosed with COVID-19, and your symptoms. You should receive instructions from your physician's office regarding next steps of care.  . When you arrive at healthcare provider, tell the healthcare staff immediately you have returned from visiting [place], [place], [place], [place] or [place]; or traveled in the [place] to [place], [place], [place], or [place]; in the last two weeks or you have been in close contact with a person diagnosed with COVID-19 in the last 2 weeks.   . Tell the health care staff about your symptoms: fever, cough and shortness of breath. . After you have been seen by a medical provider, you will be either: o Tested for (COVID-19) and discharged home on quarantine except to seek medical care if symptoms worsen, and asked to  - Stay home and avoid contact with others until you get your results (4-5 days)  - Avoid travel on public transportation if possible (such as bus, train, or airplane) or o Sent to the Emergency Department by EMS for evaluation, COVID-19 testing, and possible admission depending on your condition and test results.  What to do if you are LOW RISK for COVID-19?  Reduce your risk of any infection by using the same precautions used for avoiding the common cold or flu:  Marland Kitchen Wash your hands often with soap and warm water for at least 20 seconds.  If soap and water are not readily available, use an alcohol-based hand sanitizer with at least 60% alcohol.  Marland Kitchen  If coughing or sneezing, cover your mouth and nose by coughing or sneezing into the elbow areas of your shirt or coat, into a tissue or into your sleeve (not your hands). . Avoid shaking hands with others and consider head nods or verbal greetings only. . Avoid touching your eyes, nose, or mouth with unwashed hands.  . Avoid close contact with people who are sick. . Avoid places or events with large numbers of people  in one location, like concerts or sporting events. . Carefully consider travel plans you have or are making. . If you are planning any travel outside or inside the [place], visit the CDC's Travelers' Health webpage for the latest health notices. . If you have some symptoms but not all symptoms, continue to monitor at home and seek medical attention if your symptoms worsen. . If you are having a medical emergency, call 911.   ADDITIONAL HEALTHCARE OPTIONS FOR PATIENTS  [facility] / e-Visit: [email or web address]         [facility]: [phone]  [facility]: [phone]                   [facility]: [phone]   

## 2019-03-10 ENCOUNTER — Telehealth: Payer: Self-pay

## 2019-03-10 NOTE — Telephone Encounter (Signed)
Patient called back stating she would like to reschedule her scan to the following week so she isn't experiencing side effects.  She was given the phone number for Central Sched[employer] to call and reschedule.

## 2019-03-10 NOTE — Telephone Encounter (Signed)
Patient calls just to give Dr. Burr Medico update on her symptoms this past week.  Chemo was Monday, by Monday evening had fever 101.5 took one Tylenol at bedtime, on Tuesday highest temperature was 99.5, Wednesday fever 100.6 in evening, has been fever free ever since then.  On Tuesday developed soreness in right side wrapping around to her back, no rash noted. Still sore but not as sore.  Some left side neck pain.  [phone]   Message given to Dr. Burr Medico for review.

## 2019-03-13 ENCOUNTER — Other Ambulatory Visit: Payer: Self-pay

## 2019-03-13 ENCOUNTER — Inpatient Hospital Stay: Payer: 59

## 2019-03-13 VITALS — BP 114/78 | HR 87 | Temp 97.8°F | Resp 17 | Ht 65.0 in | Wt 105.8 lb

## 2019-03-13 DIAGNOSIS — Z5111 Encounter for antineoplastic chemotherapy: Secondary | ICD-10-CM | POA: Diagnosis not present

## 2019-03-13 DIAGNOSIS — C251 Malignant neoplasm of body of pancreas: Secondary | ICD-10-CM

## 2019-03-13 DIAGNOSIS — C787 Secondary malignant neoplasm of liver and intrahepatic bile duct: Secondary | ICD-10-CM

## 2019-03-13 DIAGNOSIS — Z95828 Presence of other vascular implants and grafts: Secondary | ICD-10-CM

## 2019-03-13 DIAGNOSIS — C259 Malignant neoplasm of pancreas, unspecified: Secondary | ICD-10-CM

## 2019-03-13 DIAGNOSIS — Z7189 Other specified counseling: Secondary | ICD-10-CM

## 2019-03-13 LAB — CBC WITH DIFFERENTIAL/PLATELET
Abs Immature Granulocytes: 0.11 10*3/uL — ABNORMAL HIGH (ref 0.00–0.07)
Basophils Absolute: 0.1 10*3/uL (ref 0.0–0.1)
Basophils Relative: 1 %
Eosinophils Absolute: 0 10*3/uL (ref 0.0–0.5)
Eosinophils Relative: 0 %
HCT: 28.7 % — ABNORMAL LOW (ref 36.0–46.0)
Hemoglobin: 9.4 g/dL — ABNORMAL LOW (ref 12.0–15.0)
Immature Granulocytes: 1 %
Lymphocytes Relative: 6 %
Lymphs Abs: 0.5 10*3/uL — ABNORMAL LOW (ref 0.7–4.0)
MCH: 32.9 pg (ref 26.0–34.0)
MCHC: 32.8 g/dL (ref 30.0–36.0)
MCV: 100.3 fL — ABNORMAL HIGH (ref 80.0–100.0)
Monocytes Absolute: 0.8 10*3/uL (ref 0.1–1.0)
Monocytes Relative: 9 %
Neutro Abs: 6.6 10*3/uL (ref 1.7–7.7)
Neutrophils Relative %: 83 %
Platelets: 153 10*3/uL (ref 150–400)
RBC: 2.86 MIL/uL — ABNORMAL LOW (ref 3.87–5.11)
RDW: 16.8 % — ABNORMAL HIGH (ref 11.5–15.5)
WBC: 8 10*3/uL (ref 4.0–10.5)
nRBC: 0 % (ref 0.0–0.2)

## 2019-03-13 LAB — COMPREHENSIVE METABOLIC PANEL
ALT: 84 U/L — ABNORMAL HIGH (ref 0–44)
AST: 94 U/L — ABNORMAL HIGH (ref 15–41)
Albumin: 3.7 g/dL (ref 3.5–5.0)
Alkaline Phosphatase: 397 U/L — ABNORMAL HIGH (ref 38–126)
Anion gap: 10 (ref 5–15)
BUN: 20 mg/dL (ref 6–20)
CO2: 25 mmol/L (ref 22–32)
Calcium: 9.5 mg/dL (ref 8.9–10.3)
Chloride: 106 mmol/L (ref 98–111)
Creatinine, Ser: 1.17 mg/dL — ABNORMAL HIGH (ref 0.44–1.00)
GFR calc Af Amer: >60 mL/min (ref >60)
GFR calc non Af Amer: 52 mL/min — ABNORMAL LOW (ref >60)
Glucose, Bld: 115 mg/dL — ABNORMAL HIGH (ref 70–99)
Potassium: 3.5 mmol/L (ref 3.5–5.1)
Sodium: 141 mmol/L (ref 135–145)
Total Bilirubin: 0.5 mg/dL (ref 0.3–1.2)
Total Protein: 7.8 g/dL (ref 6.5–8.1)

## 2019-03-13 MED ORDER — HEPARIN SOD (PORK) LOCK FLUSH 100 UNIT/ML IV SOLN
500.0000 [IU] | Freq: Once | INTRAVENOUS | Status: AC | PRN
  Administered 2019-03-13: 500 [IU]
  Filled 2019-03-13: qty 5

## 2019-03-13 MED ORDER — PEGFILGRASTIM 6 MG/0.6ML ~~LOC~~ PSKT
6.0000 mg | PREFILLED_SYRINGE | Freq: Once | SUBCUTANEOUS | Status: AC
  Administered 2019-03-13: 6 mg via SUBCUTANEOUS

## 2019-03-13 MED ORDER — PROCHLORPERAZINE MALEATE 10 MG PO TABS
ORAL_TABLET | ORAL | Status: AC
  Filled 2019-03-13: qty 1

## 2019-03-13 MED ORDER — SODIUM CHLORIDE 0.9 % IV SOLN
800.0000 mg/m2 | Freq: Once | INTRAVENOUS | Status: AC
  Administered 2019-03-13: 1178 mg via INTRAVENOUS
  Filled 2019-03-13: qty 30.98

## 2019-03-13 MED ORDER — PROCHLORPERAZINE MALEATE 10 MG PO TABS
10.0000 mg | ORAL_TABLET | Freq: Once | ORAL | Status: AC
  Administered 2019-03-13: 10 mg via ORAL

## 2019-03-13 MED ORDER — SODIUM CHLORIDE 0.9% FLUSH
10.0000 mL | Freq: Once | INTRAVENOUS | Status: AC
  Administered 2019-03-13: 10 mL
  Filled 2019-03-13: qty 10

## 2019-03-13 MED ORDER — PEGFILGRASTIM 6 MG/0.6ML ~~LOC~~ PSKT
PREFILLED_SYRINGE | SUBCUTANEOUS | Status: AC
  Filled 2019-03-13: qty 0.6

## 2019-03-13 MED ORDER — PACLITAXEL PROTEIN-BOUND CHEMO INJECTION 100 MG
100.0000 mg/m2 | Freq: Once | INTRAVENOUS | Status: AC
  Administered 2019-03-13: 150 mg via INTRAVENOUS
  Filled 2019-03-13: qty 30

## 2019-03-13 MED ORDER — SODIUM CHLORIDE 0.9 % IV SOLN
Freq: Once | INTRAVENOUS | Status: AC
  Administered 2019-03-13: 12:00:00 via INTRAVENOUS
  Filled 2019-03-13: qty 250

## 2019-03-13 MED ORDER — SODIUM CHLORIDE 0.9% FLUSH
10.0000 mL | INTRAVENOUS | Status: DC | PRN
  Administered 2019-03-13: 10 mL
  Filled 2019-03-13: qty 10

## 2019-03-13 NOTE — Progress Notes (Signed)
Patient will receive Onpro today per discussion w/ Lacie. D8 ANC = 6.6K.   Demetrius Charity, PharmD, [facility] Pharmacist [facility]acy Phone: [phone] 03/13/2019

## 2019-03-13 NOTE — Patient Instructions (Signed)
[facility] Discharge Instructions for Patients Receiving Chemotherapy  Today you received the following chemotherapy agents: Paclitaxel protein-bound (Abraxane) and Gemcitabine (Gemzar)  To help prevent nausea and vomiting after your treatment, we encourage you to take your nausea medication as directed.   If you develop nausea and vomiting that is not controlled by your nausea medication, call the clinic.   BELOW ARE SYMPTOMS THAT SHOULD BE REPORTED IMMEDIATELY:  *FEVER GREATER THAN 100.5 F  *CHILLS WITH OR WITHOUT FEVER  NAUSEA AND VOMITING THAT IS NOT CONTROLLED WITH YOUR NAUSEA MEDICATION  *UNUSUAL SHORTNESS OF BREATH  *UNUSUAL BRUISING OR BLEEDING  TENDERNESS IN MOUTH AND THROAT WITH OR WITHOUT PRESENCE OF ULCERS  *URINARY PROBLEMS  *BOWEL PROBLEMS  UNUSUAL RASH Items with * indicate a potential emergency and should be followed up as soon as possible.  Feel free to call the clinic should you have any questions or concerns. The clinic phone number is [phone].  Please show the CHEMO ALERT CARD at check-in to the Emergency Department and triage nurse.  Coronavirus (COVID-19) Are you at risk?  Are you at risk for the Coronavirus (COVID-19)?  To be considered HIGH RISK for Coronavirus (COVID-19), you have to meet the following criteria:  . Traveled to [place], [place], [place], [place] or [place]; or in the [place] to [place], [place], [place], or [place]; and have fever, cough, and shortness of breath within the last 2 weeks of travel OR . Been in close contact with a person diagnosed with COVID-19 within the last 2 weeks and have fever, cough, and shortness of breath . IF YOU DO NOT MEET THESE CRITERIA, YOU ARE CONSIDERED LOW RISK FOR COVID-19.  What to do if you are HIGH RISK for COVID-19?  . If you are having a medical emergency, call 911. . Seek medical care right away. Before you go to a doctor's office, urgent care or emergency  department, call ahead and tell them about your recent travel, contact with someone diagnosed with COVID-19, and your symptoms. You should receive instructions from your physician's office regarding next steps of care.  . When you arrive at healthcare provider, tell the healthcare staff immediately you have returned from visiting [place], [place], [place], [place] or [place]; or traveled in the [place] to [place], [place], [place], or [place]; in the last two weeks or you have been in close contact with a person diagnosed with COVID-19 in the last 2 weeks.   . Tell the health care staff about your symptoms: fever, cough and shortness of breath. . After you have been seen by a medical provider, you will be either: o Tested for (COVID-19) and discharged home on quarantine except to seek medical care if symptoms worsen, and asked to  - Stay home and avoid contact with others until you get your results (4-5 days)  - Avoid travel on public transportation if possible (such as bus, train, or airplane) or o Sent to the Emergency Department by EMS for evaluation, COVID-19 testing, and possible admission depending on your condition and test results.  What to do if you are LOW RISK for COVID-19?  Reduce your risk of any infection by using the same precautions used for avoiding the common cold or flu:  . Wash your hands often with soap and warm water for at least 20 seconds.  If soap and water are not readily available, use an alcohol-based hand sanitizer with at least 60% alcohol.  .   If coughing or sneezing, cover your mouth and nose by coughing or sneezing into the elbow areas of your shirt or coat, into a tissue or into your sleeve (not your hands). . Avoid shaking hands with others and consider head nods or verbal greetings only. . Avoid touching your eyes, nose, or mouth with unwashed hands.  . Avoid close contact with people who are sick. . Avoid places or events with large numbers of people  in one location, like concerts or sporting events. . Carefully consider travel plans you have or are making. . If you are planning any travel outside or inside the [place], visit the CDC's Travelers' Health webpage for the latest health notices. . If you have some symptoms but not all symptoms, continue to monitor at home and seek medical attention if your symptoms worsen. . If you are having a medical emergency, call 911.   ADDITIONAL HEALTHCARE OPTIONS FOR PATIENTS  [facility] / e-Visit: [email or web address]         [facility]: [phone]  [facility]: [phone]                   [facility]: [phone]   

## 2019-03-13 NOTE — Progress Notes (Signed)
Per Dr. Benay Spice: OK to treat with elevated AST/ALT

## 2019-03-14 LAB — CANCER ANTIGEN 19-9: CA 19-9: 56 U/mL — ABNORMAL HIGH (ref 0–35)

## 2019-03-15 ENCOUNTER — Ambulatory Visit (HOSPITAL_COMMUNITY): Payer: 59

## 2019-03-21 ENCOUNTER — Ambulatory Visit (HOSPITAL_COMMUNITY)
Admission: RE | Admit: 2019-03-21 | Discharge: 2019-03-21 | Disposition: A | Discharge status: Home / Self Care | Payer: 59 | Source: Ambulatory Visit | Attending: Nurse Practitioner | Admitting: Nurse Practitioner

## 2019-03-21 ENCOUNTER — Other Ambulatory Visit: Payer: Self-pay

## 2019-03-21 ENCOUNTER — Encounter (HOSPITAL_COMMUNITY): Payer: Self-pay

## 2019-03-21 DIAGNOSIS — C787 Secondary malignant neoplasm of liver and intrahepatic bile duct: Secondary | ICD-10-CM | POA: Insufficient documentation

## 2019-03-21 DIAGNOSIS — C259 Malignant neoplasm of pancreas, unspecified: Secondary | ICD-10-CM

## 2019-03-21 DIAGNOSIS — D490 Neoplasm of unspecified behavior of digestive system: Secondary | ICD-10-CM

## 2019-03-21 MED ORDER — SODIUM CHLORIDE (PF) 0.9 % IJ SOLN
INTRAMUSCULAR | Status: AC
  Filled 2019-03-21: qty 50

## 2019-03-21 MED ORDER — IOHEXOL 300 MG/ML  SOLN
100.0000 mL | Freq: Once | INTRAMUSCULAR | Status: AC | PRN
  Administered 2019-03-21: 09:00:00 100 mL via INTRAVENOUS

## 2019-03-23 NOTE — Progress Notes (Signed)
[place]   [phone] [phone]   Clinic Follow up Note   Patient Care Team: [name], MD as PCP - General (Family Medicine) [name], MD as Consulting Physician (Otolaryngology) [name], MD as Consulting Physician (Oncology)  Date of Service:  03/27/2019  CHIEF COMPLAINT: F/u of pancreatic cancer   SUMMARY OF ONCOLOGIC HISTORY: Oncology History Overview Note  Cancer Staging Pancreatic cancer [facility]) Staging form: Exocrine Pancreas, AJCC 8th Edition - Clinical stage from 08/06/2017: Stage IV (cTX, cN0, pM1) - Signed by [name], MD on 08/12/2017     Pancreatic cancer ([place])  07/23/2017 Imaging   US abdomen limited RUQ 07/23/17 IMPRESSION: 1. Cholelithiasis.  No secondary signs of acute cholecystitis. 2. Multiple solid liver masses measuring up to 6.5 cm in the left lobe of the liver, evaluation for metastatic disease is recommended. These results will be called to the ordering clinician or representative by the Radiologist Assistant, and communication documented in the PACS or zVision Dashboard.   07/23/2017 Imaging   CT Abdomen W Contrast 07/23/17 IMPRESSION: 1. Widespread metastatic disease throughout the liver. No clear primary malignancy identified in the abdomen. The pelvis was not imaged. Tissue sampling recommended. 2. Probable adenopathy superior to the pancreatic tail. No evidence of pancreatic mass. 3. Suspected incidental hemangioma inferiorly in the right hepatic lobe. 4. Nonspecific nodularity in the breasts. The patient has undergone recent (03/25/2017 and 04/01/2017) mammography and ultrasound.   07/29/2017 Initial Diagnosis   Metastasis to liver of unknown origin ([place])   07/29/2017 PET scan   PET 07/29/17  IMPRESSION: 1. Numerous bulky liver masses are hypermetabolic compatible with malignancy. 2. Accentuated activity within or along the pancreatic tail, likely represent a primary pancreatic tumor.  Consider pancreatic protocol MRI to further work this up. 3. The peripancreatic lymph node shown above the pancreatic tail is mildly hypermetabolic favoring malignancy. 4.  Prominent stool throughout the colon favors constipation. 5. 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   08/05/2017 Pathology Results   Liver Biopsy  Diagnosis 08/05/17 Liver, needle/core biopsy - CARCINOMA. - SEE COMMENT. Microscopic Comment The malignant cells are positive for cytokeratin 7. They are negative for arginase, CDX2, cytokeratin 20, estrogen receptor, GATA-3, GCDFP, Glypican 3, Hep Par 1, Napsin A, and TTF-1. This immunohistochemical is nonspecific. Possibly primary sources include pancreatobiliary and upper gastrointestinal. Radiologic correlation is necessary. Of note, organ specific markers (GATA-3, GCDFP-breast, TTF-1, Napsin A-lung, and CDX2-colon) are negative. (JBK:ecj 08/09/2017)   08/21/2017 - 02/10/2018 Chemotherapy   FOLFIRINOX every 2 weeks starting 08/21/17. Dose reduced due to neuropahty and cytopenia    10/14/2017 Imaging   CT CAP WO Contrast 10/14/17 IMPRESSION: Evidence of known numerous liver metastases without significant interval change. No other evidence of metastatic disease within the chest, abdomen or pelvis. Cholelithiasis. Tiny pericardial effusion.   12/02/2017 Genetic Testing   Negative for pathogenic mutation.  The genes analyzed were the 83 genes on [facility]'s Multi-Cancer panel (ALK, APC, ATM, AXIN2, BAP1, BARD1, BLM, BMPR1A, BRCA1, BRCA2, BRIP1, CASR, CDC73, CDH1, CDK4, CDKN1B, CDKN1C, CDKN2A, CEBPA, CHEK2, CTNNA1, DICER1, DIS3L2, EGFR, EPCAM, FH, FLCN, GATA2, GPC3, GREM1, HOXB13, HRAS, KIT, MAX, MEN1, MET, MITF, MLH1, MSH2, MSH3, MSH6, MUTYH, NBN, NF1, NF2, NTHL1, PALB2, PDGFRA, PHOX2B, PMS2, POLD1, POLE, POT1, PRKAR1A, PTCH1, PTEN, RAD50, RAD51C, RAD51D, RB1, RECQL4, RET, RUNX1, SDHA, SDHAF2, SDHB, SDHC, SDHD, SMAD4, SMARCA4, SMARCB1, SMARCE1, STK11, SUFU, TERC, TERT,  TMEM127, TP53, TSC1, TSC2, VHL, WRN, WT1).   12/17/2017 PET scan   IMPRESSION: 1. Although the liver  metastases are still visible on the CT images, they are significantly smaller and retain no abnormal metabolic activity, consistent with response to therapy. 2. No abnormal activity within the pancreas. 3. No disease progression identified. 4. Cholelithiasis.   02/21/2018 PET scan   02/21/2018 PET Scan  IMPRESSION: 1. There are 2 new small nodules identified within the right lung which measure up to 5 mm. These are too small to reliably characterize by PET-CT, but warrant close interval follow-up. 2. Again noted are multifocal liver metastasis. The target lesion within the left lobe is slightly decreased in size when compared with the previous exam. Similar to previous exam there is no abnormal hypermetabolism above background liver activity identified within the liver lesions. 3. Gallstones.   02/23/2018 - 09/04/2018 Chemotherapy   5-FU pump infusion and liposomal irinotecan, every 2 weeks.  First cycle dose reduced due to cytopenia in the travel. Stopped on 08/25/2018 due to disease progression.    05/17/2018 Imaging   05/17/2018 PET Scan IMPRESSION: 1. Mixed response. The 2 small right lung nodules have decreased in size in the interval. Single focus of increased uptake within the liver is new from 02/21/2018 and concerning for recurrent metabolically active liver metastasis. 2. Splenomegaly.  New from previous exam. 3. Diffusely increased bone marrow activity, likely reflecting treatment related changes.   09/01/2018 PET scan   PET 09/01/18 IMPRESSION: 1. Unfortunately there recurrence of hepatic metastasis with multiple new hypermetabolic lesions in LEFT and RIGHT hepatic lobe. 2. New extra hepatic site of malignancy which appears associated the mid pancreas. Difficult to define lesion on noncontrast exam. 3. Diffuse marrow activity is favored benign. 4. No evidence of pulmonary  metastasis.   12/14/2018 Imaging   CT CAP  IMPRESSION: CT demonstrates acute cholecystitis, with choledocholithiasis of the cystic duct.  These results were discussed by telephone at the time of interpretation on 12/14/2018 at 5:05 pm with Dr. Burr Medico.  Redemonstration of known liver metastases, with positive response to therapy, interval reduction in size of all lesions, with multiple demonstrating significant internal necrosis.  No acute finding of the chest.  Ancillary findings as above.   03/21/2019 Imaging   CT CAP W Contrast IMPRESSION: 1. Numerous rim enhancing liver metastases show overall trend towards mild progression although 1 of the index lesions is stable in the interval. 2. Interval development of portocaval lymphadenopathy concerning for disease progression. 3. Slight progression of main duct dilatation in the pancreas with abrupt cut off in the region of the body. 4. Interval development of a 9 mm subpleural nodule at the right base along the posterior hemidiaphragm. Close attention on follow-up recommended as metastatic disease not excluded. 5. New splenomegaly. 6. Cholelithiasis with diffuse gallbladder wall thickening. Gallbladder wall thickening is decreased in the interval. Stones and sludge noted in the gallbladder lumen.    Chemotherapy   Third-line GTX with Xeloda BID day 1-14, off for 1 week with Docetaxel and Gemcitbine on day 4 and day 11 every 3 weeks starting oral chemo on 04/10/19.    Pancreatic cancer metastasized to liver ([place])  08/11/2017 Initial Diagnosis   Pancreatic cancer metastasized to liver ([place])   09/12/2018 - 03/27/2019 Chemotherapy   second-line Gemcitabine and Abraxane for 2 weeks on, 1 week off starting 09/12/2018. Due to neuropathy, I will start her with dose reduced Abraxane. Stopped after 03/27/19 due to disease progression.     Chemotherapy   Third-line GTX with Xeloda BID day 1-14, off for 1 week with Docetaxel and Gemcitbine  on  day 4 and day 11 every 3 weeks starting oral chemo on 04/10/19.       CURRENT THERAPY:  Second-line Gemcitabine and Abraxane for 2 weeks on, 1 week off starting 09/12/2018. Cycle 5 postponed due to acute cholecystitisand only completed week 1.d.c after 03/27/19 due to disease progression  Third-line GTX with Xeloda 1030m BID day 1-14, off for 1 week with Docetaxel 32mm2 and Gemcitabine 75041m2 on day 4 and day 11 every 3 weeks starting oral chemo on 04/10/19.   INTERVAL HISTORY:  JanCHARLEA Dawson here for a follow up and treatment. She presents to the clinic alone. She called her husband to be included in the visit today. She notes after latest cycle chemo she has few days of low grade fever and then resolves. She otherwise feels well. She notes last 3 days she had slightly more abdominal pain. She did do hiking in the mountains last week which may be related. She also notes feeling more full lately. She notes neuropathy is still present, mainly in her feet.  She notes for her Xarelto her insurance says she has to have 90 days refills in order to be covered. She requested refill from me for this. She denies any signs of bleeding. She does have mild bruising of legs.    REVIEW OF SYSTEMS:   Constitutional: Denies fevers, chills or abnormal weight loss Eyes: Denies blurriness of vision Ears, nose, mouth, throat, and face: Denies mucositis or sore throat Respiratory: Denies cough, dyspnea or wheezes Cardiovascular: Denies palpitation, chest discomfort or lower extremity swelling Gastrointestinal:  Denies nausea, heartburn or change in bowel habits (+) Increased upper abdominal pain (+) abdominal fullness  Skin: Denies abnormal skin rashes Lymphatics: Denies new lymphadenopathy (+) easy bruising, mainly of legs  Neurological:Denies numbness, tingling or new weaknesses Behavioral/Psych: Mood is stable, no new changes  All other systems were reviewed with the patient and are negative.   MEDICAL HISTORY:  Past Medical History:  Diagnosis Date  . Diarrhea of presumed infectious origin 01/12/2018  . Gastroenteritis 01/12/2018  . Genetic testing 12/02/2017   Multi-Cancer panel (83 genes) @ [facility] - No pathogenic mutations detected  . Meniere disease 2016  . Pancreatitis     SURGICAL HISTORY: Past Surgical History:  Procedure Laterality Date  . CERVICAL ABLATION    . ENDOMETRIAL ABLATION  2011  . IR CHOLANGIOGRAM EXISTING TUBE  01/09/2019  . IR FLUORO GUIDE PORT INSERTION RIGHT  08/12/2017  . IR PERC CHOLECYSTOSTOMY  12/15/2018  . IR [place] VASC ACCESS RIGHT  08/12/2017  . [place] I have reviewed the social history and family history with the patient and they are unchanged from previous note.  ALLERGIES:  has No Known Allergies.  MEDICATIONS:  Current Outpatient Medications  Medication Sig Dispense Refill  . b complex vitamins tablet Take 1 tablet by mouth daily.    . cyclobenzaprine (FLEXERIL) 5 MG tablet Take 1 tablet (5 mg total) by mouth 3 (three) times daily as needed for muscle spasms. 30 tablet 0  . gabapentin (NEURONTIN) 100 MG capsule TAKE ONE CAPSULE BY MOUTH THREE TIMES DAILY 270 capsule 1  . levofloxacin (LEVAQUIN) 500 MG tablet Take 1 tablet (500 mg total) by mouth daily. Take for 5 days after chemo 10 tablet 1  . lidocaine-prilocaine (EMLA) cream Apply 1 application topically as needed.    . loperamide (IMODIUM) 2 MG capsule Take 1 capsule (2 mg total) by mouth as needed for diarrhea or  loose stools. 30 capsule 0  . loratadine (CLARITIN) 10 MG tablet Take 10 mg by mouth daily as needed for allergies.    . magic mouthwash w/lidocaine SOLN Take 10 mLs by mouth 3 (three) times daily as needed for mouth pain. Swish and spit 10 ML by mouth 3 times daily as needed for mouth pain. 240 mL 0  . meclizine (ANTIVERT) 25 MG tablet Take 25 mg by mouth 3 (three) times daily as needed for dizziness.    . ondansetron (ZOFRAN-ODT) 8 MG disintegrating  tablet TAKE 1 TABLET BY MOUTH EVERY 8 HOURS AS NEEDED FOR NAUSE OR VOMITING 40 tablet 0  . potassium chloride (K-DUR) 10 MEQ tablet Take 1 tablet (10 mEq total) by mouth 4 (four) times daily. 360 tablet 2  . prochlorperazine (COMPAZINE) 10 MG tablet Take 1 tablet (10 mg total) by mouth every 6 (six) hours as needed for nausea or vomiting. 40 tablet 2  . PROMACTA 25 MG tablet TAKE 1 TABLET BY MOUTH ONCE DAILY ON AN EMPTY STOMACH  AT LEAST 1 HOUR BEFORE OR 2 HOURS AFTER A MEAL 30 tablet 0  . Rivaroxaban (XARELTO) 15 MG TABS tablet Take 1 tablet (15 mg total) by mouth daily. 90 tablet 1  . traMADol (ULTRAM) 50 MG tablet Take 1 tablet (50 mg total) by mouth every 6 (six) hours as needed for moderate pain or severe pain. 30 tablet 0   No current facility-administered medications for this visit.    Facility-Administered Medications Ordered in Other Visits  Medication Dose Route Frequency Provider Last Rate Last Dose  . sodium chloride flush (NS) 0.9 % injection 10 mL  10 mL Intracatheter Once [name], MD        PHYSICAL EXAMINATION: ECOG PERFORMANCE STATUS: 1 - Symptomatic but completely ambulatory  Vitals:   03/27/19 1144  BP: 105/66  Pulse: 96  Resp: 18  Temp: 98.7 F (37.1 C)  SpO2: 99%   Filed Weights   03/27/19 1144 03/27/19 1150  Weight: 160 lb (72.6 kg) 106 lb (48.1 kg)    GENERAL:alert, no distress and comfortable SKIN: skin color, texture, turgor are normal, no rashes or significant lesions (+) Mild bruising of legs  EYES: normal, Conjunctiva are pink and non-injected, sclera clear  NECK: supple, thyroid normal size, non-tender, without nodularity LYMPH:  no palpable lymphadenopathy in the cervical, axillary  LUNGS: clear to auscultation and percussion with normal breathing effort HEART: regular rate & rhythm and no murmurs and no lower extremity edema ABDOMEN:abdomen soft, non-tender and normal bowel sounds Musculoskeletal:no cyanosis of digits and no clubbing  NEURO:  alert & oriented x 3 with fluent speech, no focal motor/sensory deficits  LABORATORY DATA:  I have reviewed the data as listed CBC Latest Ref Rng & Units 03/27/2019 03/13/2019 03/06/2019  WBC 4.0 - 10.5 K/uL 20.8(H) 8.0 21.5(H)  Hemoglobin 12.0 - 15.0 g/dL 9.1(L) 9.4(L) 10.4(L)  Hematocrit 36.0 - 46.0 % 27.4(L) 28.7(L) 31.3(L)  Platelets 150 - 400 K/uL 217 153 278     CMP Latest Ref Rng & Units 03/27/2019 03/13/2019 03/06/2019  Glucose 70 - 99 mg/dL 133(H) 115(H) 110(H)  BUN 6 - 20 mg/dL 16 20 19   Creatinine 0.44 - 1.00 mg/dL 1.23(H) 1.17(H) 1.16(H)  Sodium 135 - 145 mmol/L 135 141 140  Potassium 3.5 - 5.1 mmol/L 3.6 3.5 3.5  Chloride 98 - 111 mmol/L 102 106 103  CO2 22 - 32 mmol/L 25 25 26   Calcium 8.9 - 10.3 mg/dL 8.7(L) 9.5 9.7  Total Protein 6.5 - 8.1 g/dL 7.5 7.8 8.1  Total Bilirubin 0.3 - 1.2 mg/dL 1.0 0.5 0.5  Alkaline Phos 38 - 126 U/L 378(H) 397(H) 394(H)  AST 15 - 41 U/L 63(H) 94(H) 55(H)  ALT 0 - 44 U/L 56(H) 84(H) 53(H)      RADIOGRAPHIC STUDIES: I have personally reviewed the radiological images as listed and agreed with the findings in the report. No results found.   ASSESSMENT & PLAN:  Morgan Dawson is a 58 y.o. female with   1. 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 Treated with chemo.She was treated withfirst linechemoFOLFIRINOX with dose reduction. Due to neuropathyand cytopenia, chemo changed tosecond line 5-FU pump infusion and liposomal irinotecan every 2 weekswith dose reductiondue to cytopenia. -Unfortunately she recently had disease progression withworseningliver mets.  -She started second-line Gemcitabine and Abraxane2 weeks on/1 week offon 09/12/18. She tolerateswell with no significant side effects except hair lossand intermittent nocturnal fever afterchemo -We discussed her CT CAP from 03/20/29 which shows enhancing liver metastasis trending towards mild disease  progression. There is interval development of portocaval lymphadenopathy concerning for disease progression. There is also development of 56m subpleural nodule and new splenomegaly.  -I discussed with a start of disease progression I recommend to move forward with change in treatment. She is agreeable.  -I discussed treatment options of restarting oxaliplatin but given neuropathy, I do not recommend at this time. I discussed other options of combination chemo GTX with oral Xeloda day 1-14, off for 1 week which can be combined with low dose docetaxel and Gemcitabine on day 4 and day 11 every 3 weeks. She is interested. Plan to start on 8/17. -I discussed as standard treatment options are fewer, we should start looking in to clinical trails for next line treatment. She is interested, I will look into them.  -Although she does have recently increased upper abdominal pain and fullness, she has been able to maintain adequate weight and manage symptoms well. She may be good candidate for clinical trails in the future. Labs reviewed, CBC and CMP WNL except WBC 20.8, Hg 9.1, ANC 17.6, lymphocyte ct 0.4, Cr 1.23 and slightly elevated liver enzymes. Will proceed with last dose Gem/Abraxane today, while she is waiting for new regimen to start.  -she will start Xeloda on 8/17 and cycle 1 day 4 Gemcitbine and Docetaxol on 8/20 -F/u in on 8/20   2. Weight lossandmalnutrition -Due to chemo and underlying metastatic cancer. -Continue nutritional supplement and f/u with dietician. -Appetite and eating much improved.Weight continues to increaselately, now stable.   3. Transaminitis -Due to underlying chemo and malignancy -AST63, ALT 56, Alk Phos 378 today (8/3//20)  4. Pancytopenia, secondary to chemo -Required chemo dose reduction. -S/p blood transfusion as needed with gh <8. Last on 09/20/18 -On Promacta, will continue  -WBC10.8,PLTnormal,Hg at9.1(03/27/19)  5. Hypokalemia, CKD Stage III -Sheis  currently on KCL supplements -Potassium3.6 and Cr at1.23 today(03/27/19)  6.Peripheral neuropathy, grade 2 -Currently on Neurontin 200 mg at night and B complexes. Continue. -She agreed to use ice bags with Abraxane infusion, continue -stable, mainly in her feet.   7.H/o Acute cholecystitis, status post cholecystectomy tube placement 4/23per IR -she was seen by surgery during herhospitalization, surgery was not recommended due to high risk of complication, and concerns for cancer progression when holding chemotherapy for surgery -Drain intact,currently has decreased to 259mslightlybloody drainage per day -afebrileand no chills. -Her CT CAP from 12/14/18 shows she  has numerus gallstones. Will monitor for any inflammation. -Imaging in 12/2018 consistent with cholecystitis with cystic duct obstruction. No leak on HIDA. Holding intervention for now given she is asymptomatic and invasive procedure would delay chemo.  8. LE DVT -Diagnosed on December 15, 2018, when she was hospitalized for cholecystitis -Due to her bloody biliary drainage, and moderate thrombocytopenia from chemotherapy, she is on low-dose Xarelto52m daily (no loading dose), she is tolerating well and will continue  -Refilled with 90 day prescription today (03/27/19). No obvious signs of bleeding, only mild bruising of legs.    9.Goal of care discussion -She is full code now -She understands her cancer is incurable at this stage, and the goal of therapy is palliative, to prolong his life, and prevent cancer related symptoms   10. Vertigo  -continue Meclazine as needed -Will monitor. If this worsens I recommend she see ENT.    Plan -I refilled Xarelto 138mtoday -I called in Xeloda 100041m12h  today to start on 8/17, 2 weeks on and 1 week off  -CT CAP reviewed, she unfortunately developped disease progression  -Labsreviewed, willproceed withchemolast dose Gem/Abraxane today  -Lab, flush, and chemo  docetaxel and gemcitabine ion 820 and 8/27    No problem-specific Assessment & Plan notes found for this encounter.   No orders of the defined types were placed in this encounter.  All questions were answered. The patient knows to call the clinic with any problems, questions or concerns. No barriers to learning was detected. I spent 25 minutes counseling the patient face to face. The total time spent in the appointment was 35 minutes and more than 50% was on counseling and review of test results     YanTruitt MerleD 03/27/2019   I, AmoJoslyn Devonm acting as scribe for YanTruitt MerleD.   I have reviewed the above documentation for accuracy and completeness, and I agree with the above.

## 2019-03-27 ENCOUNTER — Other Ambulatory Visit: Payer: 59

## 2019-03-27 ENCOUNTER — Inpatient Hospital Stay: Payer: 59 | Attending: Hematology

## 2019-03-27 ENCOUNTER — Inpatient Hospital Stay: Payer: 59

## 2019-03-27 ENCOUNTER — Encounter: Payer: Self-pay | Admitting: Hematology

## 2019-03-27 ENCOUNTER — Inpatient Hospital Stay (HOSPITAL_BASED_OUTPATIENT_CLINIC_OR_DEPARTMENT_OTHER): Payer: 59 | Admitting: Hematology

## 2019-03-27 ENCOUNTER — Other Ambulatory Visit: Payer: Self-pay

## 2019-03-27 ENCOUNTER — Telehealth: Payer: Self-pay | Admitting: Hematology

## 2019-03-27 VITALS — BP 105/66 | HR 96 | Temp 98.7°F | Resp 18 | Ht 65.0 in | Wt 106.0 lb

## 2019-03-27 DIAGNOSIS — C787 Secondary malignant neoplasm of liver and intrahepatic bile duct: Secondary | ICD-10-CM

## 2019-03-27 DIAGNOSIS — N183 Chronic kidney disease, stage 3 unspecified: Secondary | ICD-10-CM

## 2019-03-27 DIAGNOSIS — Z5111 Encounter for antineoplastic chemotherapy: Secondary | ICD-10-CM | POA: Insufficient documentation

## 2019-03-27 DIAGNOSIS — C251 Malignant neoplasm of body of pancreas: Secondary | ICD-10-CM

## 2019-03-27 DIAGNOSIS — C259 Malignant neoplasm of pancreas, unspecified: Secondary | ICD-10-CM | POA: Diagnosis present

## 2019-03-27 DIAGNOSIS — Z95828 Presence of other vascular implants and grafts: Secondary | ICD-10-CM

## 2019-03-27 DIAGNOSIS — D6481 Anemia due to antineoplastic chemotherapy: Secondary | ICD-10-CM

## 2019-03-27 DIAGNOSIS — T451X5A Adverse effect of antineoplastic and immunosuppressive drugs, initial encounter: Secondary | ICD-10-CM

## 2019-03-27 DIAGNOSIS — Z7189 Other specified counseling: Secondary | ICD-10-CM

## 2019-03-27 LAB — CBC WITH DIFFERENTIAL/PLATELET
Abs Immature Granulocytes: 0.47 10*3/uL — ABNORMAL HIGH (ref 0.00–0.07)
Basophils Absolute: 0.1 10*3/uL (ref 0.0–0.1)
Basophils Relative: 0 %
Eosinophils Absolute: 0 10*3/uL (ref 0.0–0.5)
Eosinophils Relative: 0 %
HCT: 27.4 % — ABNORMAL LOW (ref 36.0–46.0)
Hemoglobin: 9.1 g/dL — ABNORMAL LOW (ref 12.0–15.0)
Immature Granulocytes: 2 %
Lymphocytes Relative: 2 %
Lymphs Abs: 0.4 10*3/uL — ABNORMAL LOW (ref 0.7–4.0)
MCH: 33.7 pg (ref 26.0–34.0)
MCHC: 33.2 g/dL (ref 30.0–36.0)
MCV: 101.5 fL — ABNORMAL HIGH (ref 80.0–100.0)
Monocytes Absolute: 2.2 10*3/uL — ABNORMAL HIGH (ref 0.1–1.0)
Monocytes Relative: 11 %
Neutro Abs: 17.6 10*3/uL — ABNORMAL HIGH (ref 1.7–7.7)
Neutrophils Relative %: 85 %
Platelets: 217 10*3/uL (ref 150–400)
RBC: 2.7 MIL/uL — ABNORMAL LOW (ref 3.87–5.11)
RDW: 19 % — ABNORMAL HIGH (ref 11.5–15.5)
WBC: 20.8 10*3/uL — ABNORMAL HIGH (ref 4.0–10.5)
nRBC: 0.1 % (ref 0.0–0.2)

## 2019-03-27 LAB — COMPREHENSIVE METABOLIC PANEL
ALT: 56 U/L — ABNORMAL HIGH (ref 0–44)
AST: 63 U/L — ABNORMAL HIGH (ref 15–41)
Albumin: 3.6 g/dL (ref 3.5–5.0)
Alkaline Phosphatase: 378 U/L — ABNORMAL HIGH (ref 38–126)
Anion gap: 8 (ref 5–15)
BUN: 16 mg/dL (ref 6–20)
CO2: 25 mmol/L (ref 22–32)
Calcium: 8.7 mg/dL — ABNORMAL LOW (ref 8.9–10.3)
Chloride: 102 mmol/L (ref 98–111)
Creatinine, Ser: 1.23 mg/dL — ABNORMAL HIGH (ref 0.44–1.00)
GFR calc Af Amer: 57 mL/min — ABNORMAL LOW (ref >60)
GFR calc non Af Amer: 49 mL/min — ABNORMAL LOW (ref >60)
Glucose, Bld: 133 mg/dL — ABNORMAL HIGH (ref 70–99)
Potassium: 3.6 mmol/L (ref 3.5–5.1)
Sodium: 135 mmol/L (ref 135–145)
Total Bilirubin: 1 mg/dL (ref 0.3–1.2)
Total Protein: 7.5 g/dL (ref 6.5–8.1)

## 2019-03-27 MED ORDER — SODIUM CHLORIDE 0.9 % IV SOLN
800.0000 mg/m2 | Freq: Once | INTRAVENOUS | Status: AC
  Administered 2019-03-27: 1178 mg via INTRAVENOUS
  Filled 2019-03-27: qty 30.98

## 2019-03-27 MED ORDER — HEPARIN SOD (PORK) LOCK FLUSH 100 UNIT/ML IV SOLN
500.0000 [IU] | Freq: Once | INTRAVENOUS | Status: AC | PRN
  Administered 2019-03-27: 500 [IU]
  Filled 2019-03-27: qty 5

## 2019-03-27 MED ORDER — SODIUM CHLORIDE 0.9 % IV SOLN
Freq: Once | INTRAVENOUS | Status: AC
  Administered 2019-03-27: 13:00:00 via INTRAVENOUS
  Filled 2019-03-27: qty 250

## 2019-03-27 MED ORDER — PACLITAXEL PROTEIN-BOUND CHEMO INJECTION 100 MG
100.0000 mg/m2 | Freq: Once | INTRAVENOUS | Status: AC
  Administered 2019-03-27: 150 mg via INTRAVENOUS
  Filled 2019-03-27: qty 30

## 2019-03-27 MED ORDER — SODIUM CHLORIDE 0.9% FLUSH
10.0000 mL | Freq: Once | INTRAVENOUS | Status: AC
  Administered 2019-03-27: 10 mL
  Filled 2019-03-27: qty 10

## 2019-03-27 MED ORDER — SODIUM CHLORIDE 0.9% FLUSH
10.0000 mL | INTRAVENOUS | Status: DC | PRN
  Administered 2019-03-27: 15:00:00 10 mL
  Filled 2019-03-27: qty 10

## 2019-03-27 MED ORDER — PROCHLORPERAZINE MALEATE 10 MG PO TABS
10.0000 mg | ORAL_TABLET | Freq: Once | ORAL | Status: AC
  Administered 2019-03-27: 13:00:00 10 mg via ORAL

## 2019-03-27 MED ORDER — RIVAROXABAN 15 MG PO TABS: 15.0000 mg | ORAL_TABLET | Freq: Every day | ORAL | 1 refills | Status: DC

## 2019-03-27 MED ORDER — PROCHLORPERAZINE MALEATE 10 MG PO TABS
ORAL_TABLET | ORAL | Status: AC
  Filled 2019-03-27: qty 1

## 2019-03-27 MED ORDER — CAPECITABINE 500 MG PO TABS: 750.0000 mg/m2 | ORAL_TABLET | Freq: Two times a day (BID) | ORAL | 2 refills | Status: DC

## 2019-03-27 NOTE — Telephone Encounter (Signed)
Scheduled appt per 8/3 los.

## 2019-03-27 NOTE — Patient Instructions (Signed)
[facility] Discharge Instructions for Patients Receiving Chemotherapy  Today you received the following chemotherapy agents: Abraxane and Gemzar   To help prevent nausea and vomiting after your treatment, we encourage you to take your nausea medication as directed.    If you develop nausea and vomiting that is not controlled by your nausea medication, call the clinic.   BELOW ARE SYMPTOMS THAT SHOULD BE REPORTED IMMEDIATELY:  *FEVER GREATER THAN 100.5 F  *CHILLS WITH OR WITHOUT FEVER  NAUSEA AND VOMITING THAT IS NOT CONTROLLED WITH YOUR NAUSEA MEDICATION  *UNUSUAL SHORTNESS OF BREATH  *UNUSUAL BRUISING OR BLEEDING  TENDERNESS IN MOUTH AND THROAT WITH OR WITHOUT PRESENCE OF ULCERS  *URINARY PROBLEMS  *BOWEL PROBLEMS  UNUSUAL RASH Items with * indicate a potential emergency and should be followed up as soon as possible.  Feel free to call the clinic should you have any questions or concerns. The clinic phone number is [phone].  Please show the CHEMO ALERT CARD at check-in to the Emergency Department and triage nurse.   

## 2019-03-27 NOTE — Progress Notes (Signed)
DISCONTINUE ON PATHWAY REGIMEN - Pancreatic     A cycle is every 28 days:     Nab-paclitaxel (protein bound)      Gemcitabine   **Always confirm dose/schedule in your pharmacy ordering system**  REASON: Disease Progression PRIOR TREATMENT: PANOS51: Nab-Paclitaxel (Abraxane(R)) 125 mg/m2 D1, 8, 15 + Gemcitabine 1,000 mg/m2 D1, 8, 15 q28 Days Until Progression or Toxicity TREATMENT RESPONSE: Partial Response (PR)  START OFF PATHWAY REGIMEN - Pancreatic Adenocarcinoma   OFF00011:Docetaxel + Gemcitabine (75/1000):   A cycle is every 21 days:     Docetaxel      Gemcitabine   **Always confirm dose/schedule in your pharmacy ordering system**  Patient Characteristics: Metastatic Disease, Third Line and Beyond, MSS/pMMR or MSI Unknown Current evidence of distant metastases<= Yes AJCC T Category: TX AJCC N Category: NX AJCC M Category: M1 AJCC 8 Stage Grouping: IV Line of Therapy: Third Engineer, civil (consulting) Status: MSS/pMMR Intent of Therapy: Non-Curative / Palliative Intent, Discussed with Patient

## 2019-03-28 ENCOUNTER — Other Ambulatory Visit: Payer: Self-pay | Admitting: Nurse Practitioner

## 2019-03-28 ENCOUNTER — Telehealth: Payer: Self-pay | Admitting: Pharmacist

## 2019-03-28 ENCOUNTER — Telehealth: Payer: Self-pay

## 2019-03-28 DIAGNOSIS — C259 Malignant neoplasm of pancreas, unspecified: Secondary | ICD-10-CM

## 2019-03-28 DIAGNOSIS — C787 Secondary malignant neoplasm of liver and intrahepatic bile duct: Secondary | ICD-10-CM

## 2019-03-28 DIAGNOSIS — C251 Malignant neoplasm of body of pancreas: Secondary | ICD-10-CM

## 2019-03-28 NOTE — Telephone Encounter (Signed)
Oral Oncology Patient Advocate Encounter  Received notification from [facility] that prior authorization for Xeloda is required.  PA submitted on CoverMyMeds Key [id number] Status is pending  Oral Oncology Clinic will continue to follow.  Morgan Dawson Phone [phone] Fax [phone] 03/28/2019    12:02 PM

## 2019-03-28 NOTE — Telephone Encounter (Signed)
Oral Oncology Pharmacist Encounter  Received new prescription for Xeloda (capecitabine) for the 3rd line treatment of metastatic pancreatic adenocarcinoma in conjunction with gemcitabine and docetaxel, planned duration until disease progression or unacceptable toxicity.  Xeloda prescription has been written at ~671 mg/m2 twice daily on days 1-14 Gemcitabine will likely be administered at 750 mg/m2 IV over 75 min on days 4 and 11 Docetaxel will likely be administered at 30 mg/m2 on days 4 and 11 Cycle length is 21 days  Labs from 03/27/2019 assessed, OK for treatment change. SCr=1.23, est CrCl ~ 38 mL/min Manufacturer recommends dose reduction to 75% of normal dose with CrCl 30-50 mL/min Noted dose reduction of Xeloda already taken into account by MD  Current medication list in Epic reviewed, no DDIs with Xeloda identified.  Prescription has been e-scribed to the [facility] for benefits analysis and approval by MD. Patient previously mandated to receive her Promacta from [facility] specialty pharmacy, per her insurance requirement. Xeloda prescription will be sent to appropriate pharmacy for dispensing once insurance authorization is obtained.  Oral Oncology Clinic will continue to follow for insurance authorization, copayment issues, initial counseling and start date.  Johny Drilling, PharmD, BCPS, BCOP  03/28/2019 10:33 AM Oral Oncology Clinic [phone]

## 2019-03-29 NOTE — Telephone Encounter (Signed)
Oral Oncology Pharmacist Encounter  Insurance authorization for capecitabine has been denied by patient's insurance stating that the plan does not consider the request for the use of capecitabine in combination with gemcitabine and docetaxel to be a medically excepted indication when used in the third line setting for metastatic pancreatic adenocarcinoma.  Appeal packet has been completed and faxed to the appeals department at [facility] at [phone].  Appeal packet included letter of medical necessity, peer-reviewed article showing the use of the GTX regimen and subsequent line settings, and exerts from [employer] detailing the appropriate use of this regimen.  Appeal packet has been marked urgent, we expect final determination on this appeal within 72 business hours.  This encounter will continue to be updated until final determination.  Johny Drilling, PharmD, BCPS, BCOP  03/29/2019 10:40 AM Oral Oncology Clinic [phone]

## 2019-03-31 MED ORDER — CAPECITABINE 500 MG PO TABS: 750.0000 mg/m2 | ORAL_TABLET | Freq: Two times a day (BID) | ORAL | 2 refills | Status: DC

## 2019-03-31 MED ORDER — PROCHLORPERAZINE MALEATE 10 MG PO TABS: 10.0000 mg | ORAL_TABLET | Freq: Four times a day (QID) | ORAL | 2 refills | Status: AC | PRN

## 2019-03-31 NOTE — Telephone Encounter (Signed)
Oral Oncology Pharmacist Encounter  Received notification from [facility] that prior authorization denial for capecitabine tablets has been overturned.  Authorization is now approved Effective dates: 03/30/19-03/29/20 Phone: [phone]  Patient must fill her capecitabine at [facility] specialty pharmacy per insurance requirement.  Johny Drilling, PharmD, BCPS, BCOP  03/31/2019 8:44 AM Oral Oncology Clinic [phone]

## 2019-03-31 NOTE — Telephone Encounter (Signed)
Oral Chemotherapy Pharmacist Encounter   I spoke with patient for overview of: Xeloda (capecitabine) for the treatment of heavily pretreated, metastatic pancreatic adenocarcinoma in conjunction with gemcitabine and docetaxel, planned duration until disease progression or unacceptable toxicity.   Counseled patient on administration, dosing, side effects, monitoring, drug-food interactions, safe handling, storage, and disposal.  Patient will take Xeloda 500mg  tablets, 2 tablets (1000mg ) by mouth in AM and 2 tabs (1000mg ) by mouth in PM, within 30 minutes of finishing meals, on days 1-14 of each 21 day cycle.   Gemcitabine and docetaxel will be infused on day 4 and 11 of each 21 day cycle.  Xeloda start date: 04/10/2019 Gemcitabine and docetaxel: 04/13/2019  Adverse effects include but are not limited to: fatigue, decreased blood counts, GI upset, diarrhea, mouth sores, and hand-foot syndrome.  Patient has anti-emetic on hand and knows to take it if nausea develops.   New prescription for prochlorperazine e-scribed to patient's local pharmacy.  Patient will obtain anti diarrheal and alert the office of 4 or more loose stools above baseline.  Reviewed with patient importance of keeping a medication schedule and plan for any missed doses.  Medication reconciliation performed and medication/allergy list updated.  Insurance authorization for Xeloda has been obtained. Prescription must be filled at [facility]. Prescription was sent today. We will continue to follow-up with dispensing pharmacy to ensure timely dispensing of medication. Copayment is not known at this time.  All questions answered.  Morgan Dawson voiced understanding and appreciation.   Patient knows to call the office with questions or concerns.  Morgan Dawson, PharmD, BCPS, BCOP  03/31/2019   11:29 AM Oral Oncology Clinic [phone]

## 2019-03-31 NOTE — Telephone Encounter (Signed)
Oral Oncology Pharmacist Encounter  Received notification from [facility] that insurance authorization of capecitabine is now approved. Prescription must be filled at [facility] per insurance requirement.  Capecitabine 500 mg tablets prescription has been e-scribed to [facility] in [place], [place]. Supporting information including insurance information, demographic information, medication list, and insurance authorization has been faxed to dispensing pharmacy.  Pharmacy contact has been notified of incoming prescription and provided need by date of 04/07/19.  I will reach out to the patient to discuss initial counseling and site of dispensing once I confirm pharmacy is able to process patient's claim.  Johny Drilling, PharmD, BCPS, BCOP  03/31/2019 8:50 AM Oral Oncology Clinic [phone]

## 2019-04-03 ENCOUNTER — Ambulatory Visit: Payer: 59

## 2019-04-03 ENCOUNTER — Other Ambulatory Visit: Payer: 59

## 2019-04-03 ENCOUNTER — Ambulatory Visit: Payer: 59 | Admitting: Hematology

## 2019-04-05 NOTE — Telephone Encounter (Signed)
Oral Oncology Patient Advocate Encounter  [facility] has called the patient a couple of times with no success.  I called the patient and she has received 2 phone calls but one man had such a strong accent that she could not understand him and the next person just asked if she was eligible for Medicare and hung up.  I gave her [facility] specialty phone number [phone] and she is going to call them today to get her Xeloda shipment scheduled.  Oral oncology clinic will continue to follow  [place] Patient [place] Phone [phone] Fax [phone] 04/05/2019   1:48 PM

## 2019-04-07 NOTE — Telephone Encounter (Signed)
Oral Oncology Patient Advocate Encounter  Confirmed with [facility] pharmacy that Xeloda was shipped and planned to deliver on 8/14 with a $0 copay.  Morgan Dawson Patient [place] Phone [phone] Fax [phone] 04/07/2019   9:35 AM

## 2019-04-09 ENCOUNTER — Other Ambulatory Visit: Payer: Self-pay

## 2019-04-09 ENCOUNTER — Encounter (HOSPITAL_COMMUNITY): Payer: Self-pay

## 2019-04-09 ENCOUNTER — Emergency Department (HOSPITAL_COMMUNITY): Payer: 59

## 2019-04-09 ENCOUNTER — Emergency Department (HOSPITAL_COMMUNITY)
Admission: EM | Admit: 2019-04-09 | Discharge: 2019-04-09 | Disposition: A | Discharge status: Home / Self Care | Payer: 59 | Attending: Emergency Medicine | Admitting: Emergency Medicine

## 2019-04-09 DIAGNOSIS — C787 Secondary malignant neoplasm of liver and intrahepatic bile duct: Secondary | ICD-10-CM | POA: Diagnosis not present

## 2019-04-09 DIAGNOSIS — R6510 Systemic inflammatory response syndrome (SIRS) of non-infectious origin without acute organ dysfunction: Secondary | ICD-10-CM | POA: Insufficient documentation

## 2019-04-09 DIAGNOSIS — Z20828 Contact with and (suspected) exposure to other viral communicable diseases: Secondary | ICD-10-CM | POA: Diagnosis not present

## 2019-04-09 DIAGNOSIS — N183 Chronic kidney disease, stage 3 (moderate): Secondary | ICD-10-CM | POA: Insufficient documentation

## 2019-04-09 DIAGNOSIS — Z7901 Long term (current) use of anticoagulants: Secondary | ICD-10-CM | POA: Insufficient documentation

## 2019-04-09 DIAGNOSIS — Z79899 Other long term (current) drug therapy: Secondary | ICD-10-CM | POA: Insufficient documentation

## 2019-04-09 DIAGNOSIS — R509 Fever, unspecified: Secondary | ICD-10-CM | POA: Insufficient documentation

## 2019-04-09 DIAGNOSIS — K8020 Calculus of gallbladder without cholecystitis without obstruction: Secondary | ICD-10-CM

## 2019-04-09 LAB — COMPREHENSIVE METABOLIC PANEL
ALT: 49 U/L — ABNORMAL HIGH (ref 0–44)
AST: 51 U/L — ABNORMAL HIGH (ref 15–41)
Albumin: 3.3 g/dL — ABNORMAL LOW (ref 3.5–5.0)
Alkaline Phosphatase: 362 U/L — ABNORMAL HIGH (ref 38–126)
Anion gap: 12 (ref 5–15)
BUN: 24 mg/dL — ABNORMAL HIGH (ref 6–20)
CO2: 25 mmol/L (ref 22–32)
Calcium: 9.1 mg/dL (ref 8.9–10.3)
Chloride: 97 mmol/L — ABNORMAL LOW (ref 98–111)
Creatinine, Ser: 1.23 mg/dL — ABNORMAL HIGH (ref 0.44–1.00)
GFR calc Af Amer: 57 mL/min — ABNORMAL LOW (ref >60)
GFR calc non Af Amer: 49 mL/min — ABNORMAL LOW (ref >60)
Glucose, Bld: 156 mg/dL — ABNORMAL HIGH (ref 70–99)
Potassium: 3.4 mmol/L — ABNORMAL LOW (ref 3.5–5.1)
Sodium: 134 mmol/L — ABNORMAL LOW (ref 135–145)
Total Bilirubin: 1.2 mg/dL (ref 0.3–1.2)
Total Protein: 7.7 g/dL (ref 6.5–8.1)

## 2019-04-09 LAB — CBC WITH DIFFERENTIAL/PLATELET
Abs Immature Granulocytes: 0.09 10*3/uL — ABNORMAL HIGH (ref 0.00–0.07)
Basophils Absolute: 0 10*3/uL (ref 0.0–0.1)
Basophils Relative: 0 %
Eosinophils Absolute: 0 10*3/uL (ref 0.0–0.5)
Eosinophils Relative: 0 %
HCT: 26.1 % — ABNORMAL LOW (ref 36.0–46.0)
Hemoglobin: 8.5 g/dL — ABNORMAL LOW (ref 12.0–15.0)
Immature Granulocytes: 1 %
Lymphocytes Relative: 2 %
Lymphs Abs: 0.3 10*3/uL — ABNORMAL LOW (ref 0.7–4.0)
MCH: 33.5 pg (ref 26.0–34.0)
MCHC: 32.6 g/dL (ref 30.0–36.0)
MCV: 102.8 fL — ABNORMAL HIGH (ref 80.0–100.0)
Monocytes Absolute: 1.4 10*3/uL — ABNORMAL HIGH (ref 0.1–1.0)
Monocytes Relative: 11 %
Neutro Abs: 11.9 10*3/uL — ABNORMAL HIGH (ref 1.7–7.7)
Neutrophils Relative %: 86 %
Platelets: 360 10*3/uL (ref 150–400)
RBC: 2.54 MIL/uL — ABNORMAL LOW (ref 3.87–5.11)
RDW: 16.6 % — ABNORMAL HIGH (ref 11.5–15.5)
WBC: 13.7 10*3/uL — ABNORMAL HIGH (ref 4.0–10.5)
nRBC: 0 % (ref 0.0–0.2)

## 2019-04-09 LAB — URINALYSIS, ROUTINE W REFLEX MICROSCOPIC
Bilirubin Urine: NEGATIVE
Glucose, UA: NEGATIVE mg/dL
Hgb urine dipstick: NEGATIVE
Ketones, ur: NEGATIVE mg/dL
Leukocytes,Ua: NEGATIVE
Nitrite: NEGATIVE
Protein, ur: 100 mg/dL — AB
Specific Gravity, Urine: 1.027 (ref 1.005–1.030)
pH: 5 (ref 5.0–8.0)

## 2019-04-09 LAB — LIPASE, BLOOD: Lipase: 386 U/L — ABNORMAL HIGH (ref 11–51)

## 2019-04-09 LAB — PROTIME-INR
INR: 1.5 — ABNORMAL HIGH (ref 0.8–1.2)
Prothrombin Time: 18.1 seconds — ABNORMAL HIGH (ref 11.4–15.2)

## 2019-04-09 LAB — LACTIC ACID, PLASMA
Lactic Acid, Venous: 1.3 mmol/L (ref 0.5–1.9)
Lactic Acid, Venous: 1.2 mmol/L (ref 0.5–1.9)

## 2019-04-09 LAB — SARS CORONAVIRUS 2 BY RT PCR (HOSPITAL ORDER, PERFORMED IN ~~LOC~~ HOSPITAL LAB): SARS Coronavirus 2: NEGATIVE

## 2019-04-09 LAB — APTT: aPTT: 54 seconds — ABNORMAL HIGH (ref 24–36)

## 2019-04-09 MED ORDER — SODIUM CHLORIDE (PF) 0.9 % IJ SOLN
INTRAMUSCULAR | Status: AC
  Filled 2019-04-09: qty 50

## 2019-04-09 MED ORDER — ACETAMINOPHEN 325 MG PO TABS
650.0000 mg | ORAL_TABLET | Freq: Once | ORAL | Status: AC | PRN
  Administered 2019-04-09: 12:00:00 650 mg via ORAL
  Filled 2019-04-09: qty 2

## 2019-04-09 MED ORDER — SODIUM CHLORIDE 0.9 % IV SOLN
2.0000 g | Freq: Once | INTRAVENOUS | Status: DC
  Filled 2019-04-09: qty 2

## 2019-04-09 MED ORDER — METRONIDAZOLE IN NACL 5-0.79 MG/ML-% IV SOLN
500.0000 mg | Freq: Once | INTRAVENOUS | Status: DC
  Filled 2019-04-09: qty 100

## 2019-04-09 MED ORDER — IOHEXOL 300 MG/ML  SOLN
100.0000 mL | Freq: Once | INTRAMUSCULAR | Status: AC | PRN
  Administered 2019-04-09: 13:00:00 100 mL via INTRAVENOUS

## 2019-04-09 MED ORDER — VANCOMYCIN HCL IN DEXTROSE 1-5 GM/200ML-% IV SOLN
1000.0000 mg | Freq: Once | INTRAVENOUS | Status: DC
  Filled 2019-04-09: qty 200

## 2019-04-09 MED ORDER — SODIUM CHLORIDE 0.9 % IV BOLUS
500.0000 mL | Freq: Once | INTRAVENOUS | Status: AC
  Administered 2019-04-09: 12:00:00 500 mL via INTRAVENOUS

## 2019-04-09 NOTE — Discharge Instructions (Addendum)
You were seen in the ER for fever.  Extensive work-up today did not reveal any source of infection.  Your COVID test is negative.  Your urinalysis did not show signs of infection, but we have sent the urine for culture to rule out a occult infection.  Chest x-ray did not show any signs of infection in your chest.  CT of your abdomen and pelvis showed stable gallstones, call bladder thickening and sludge that has been previously seen in the past.  There is no signs of acute cholecystitis.  We have also obtained blood cultures which will be sent to the lab over the next 5 days to see if there is any bacteria in your blood.  You are appropriate for discharge with close monitoring of your symptoms.  It is possible, but unlikely, that this fever is from early cholecystitis.  Monitor your symptoms.  Return to the ER if there is worsening right upper abdominal pain, nausea, vomiting or persistent fevers.  Call your oncologist tomorrow to discuss your fever and upcoming chemotherapy plans.

## 2019-04-09 NOTE — ED Triage Notes (Signed)
Patient arrived via [facility] from home. Patient is AOx4 and ambulatory. Patient chief complaint is fever with some abdominal pain which is chronic due to stage IV Pancreatic cancer. Patient stated fever began yesterday evening and was 100.0 F, and increased that night to 102.3, consumed Tylenol and fever dropped. Patient has been going through chemo for 19 months. Patient has no other complaints.

## 2019-04-09 NOTE — Progress Notes (Addendum)
A consult was received from an ED provider for Vancomycin and Cefepime per pharmacy dosing.  The patient's profile has been reviewed for ht/wt/allergies/indication/available labs.    A one time order has already been placed by provider for Vancomycin 1g IV and Cefepime 2g IV (appropriate doses).  Further antibiotics/pharmacy consults should be ordered by admitting physician if indicated.                       Thank you, Luiz Ochoa 04/09/2019  10:53 AM   Addendum:  Antibiotics d/c'ed by ED provider at this time.  Luiz Ochoa  04/09/2019 11:41 AM

## 2019-04-09 NOTE — ED Provider Notes (Signed)
[facility] DEPT Provider Note   CSN: [id number] Arrival date & time: 04/09/19  1000    History   Chief Complaint Chief Complaint  Patient presents with   Code Sepsis   Abdominal Pain   Fever    HPI Morgan Dawson is a 58 y.o. female with h/o pancreatic cancer with liver metastasis 2018 currently undergoing chemotherapy infusion therapy, cholelithiasis, cholecystitis s/p percutaneous cholecystostomy 11/2018, LLE DVT on Xarelto presents to ER for fever onset yesterday as high as 102.3 orally. She took antipyretic last night and fever resolved.  Woke up this morning with fever 100.0 orally again and came to ER for evaluation. Reports for the last 2-3 days has had mild RUQ abdominal pain that radiates to her right flank associated with nausea and dry heaving.  Reports this feels similar to episode of gallstones and cholecystitis in April that was treated with drain.  She denies any radiation of pain into chest, SOB, pleuritic pain, hematemesis, dysuria, hematuria, urgency.  She has chronic loose BMs unchanged without melena.  Denies h/o kidney stones, pyelonephritis.  She denies any other infectious symptoms including congestion, rhinorrhea, sore throat, cough.  No exposure to sick contacts. No exposure to COVID-19. No recent travel. She starts a new type of oral chemo tomorrow morning and is worried that this will be delayed because of her fever.      HPI  Past Medical History:  Diagnosis Date   Diarrhea of presumed infectious origin 01/12/2018   Gastroenteritis 01/12/2018   Genetic testing 12/02/2017   Multi-Cancer panel (83 genes) @ [facility] - No pathogenic mutations detected   Meniere disease 2016   Pancreatitis     Patient Active Problem List   Diagnosis Date Noted   Fever 01/28/2019   Liver metastases - presumed pancreatic cancer primary 12/14/2018   Cholelithiasis - calcified gallstones 12/14/2018   Acute cholecystitis s/p Percutaneous  cholecystostomy 12/15/2018 12/14/2018   On antineoplastic chemotherapy 12/14/2018   Inguinal hernia, left 12/14/2018   Leg edema, left 12/14/2018   Hypokalemia 01/12/2018   Hypomagnesemia 01/12/2018   Thrombocytopenia ([place]) 01/12/2018   Febrile neutropenia ([place]) 01/12/2018   CKD (chronic kidney disease), stage III ([place]) 11/14/2017   Anemia due to antineoplastic chemotherapy 11/14/2017   Port-A-Cath in place 09/21/2017   Pancreatic cancer metastasized to liver ([place]) 08/11/2017   Goals of care, counseling/discussion 08/11/2017   Pancreatic cancer ([place]) 07/29/2017   Hemangioma of liver, right lobe segment 5 12/13/2016   Vertigo 10/21/2015   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      Home Medications    Prior to Admission medications   Medication Sig Start Date End Date Taking? Authorizing Provider  b complex vitamins tablet Take 1 tablet by mouth daily.   Yes [provider]  cyclobenzaprine (FLEXERIL) 5 MG tablet Take 1 tablet (5 mg total) by mouth 3 (three) times daily as needed for muscle spasms. 03/06/19  Yes Alla Feeling, NP  gabapentin (NEURONTIN) 100 MG capsule TAKE ONE  CAPSULE BY MOUTH THREE TIMES DAILY Patient taking differently: Take 100 mg by mouth 2 (two) times daily.  12/23/18  Yes [name], MD  meclizine (ANTIVERT) 25 MG tablet Take 25 mg by mouth 3 (three) times daily as needed for dizziness.   Yes [provider]  potassium chloride (K-DUR) 10 MEQ tablet Take 1 tablet (10 mEq total) by mouth 4 (four) times  daily. Patient taking differently: Take 30 mEq by mouth daily.  06/08/18  Yes [name], MD  PROMACTA 25 MG tablet TAKE 1 TABLET BY MOUTH ONCE DAILY ON AN EMPTY STOMACH  AT LEAST 1 HOUR BEFORE OR 2 HOURS AFTER A MEAL Patient taking differently: Take 25 mg by mouth daily.  03/06/19  Yes [name], MD  Rivaroxaban (XARELTO) 15 MG TABS tablet Take 1 tablet (15 mg total) by mouth daily. 03/27/19  Yes [name], MD  traMADol (ULTRAM) 50 MG tablet Take 1 tablet (50 mg total) by mouth every 6 (six) hours as needed for moderate pain or severe pain. 12/22/18  Yes Alla Feeling, NP  capecitabine (XELODA) 500 MG tablet Take 2 tablets (1,000 mg total) by mouth 2 (two) times daily after a meal. Take on days 1-14 of each 21 day cycle 03/31/19   [name], MD  levofloxacin (LEVAQUIN) 500 MG tablet Take 1 tablet (500 mg total) by mouth daily. Take for 5 days after chemo Patient not taking: Reported on 04/09/2019 02/13/19   [name], MD  loperamide (IMODIUM) 2 MG capsule Take 1 capsule (2 mg total) by mouth as needed for diarrhea or loose stools. 01/15/18   Lavina Hamman, MD  loratadine (CLARITIN) 10 MG tablet Take 10 mg by mouth daily as needed for allergies.    [provider]  magic mouthwash w/lidocaine SOLN Take 10 mLs by mouth 3 (three) times daily as needed for mouth pain. Swish and spit 10 ML by mouth 3 times daily as needed for mouth pain. Patient not taking: Reported on 04/09/2019 08/31/17   Alla Feeling, NP  ondansetron (ZOFRAN-ODT) 8 MG disintegrating tablet TAKE 1 TABLET BY MOUTH EVERY 8 HOURS AS NEEDED FOR NAUSE OR VOMITING Patient taking differently: Take 8 mg by mouth every 8 (eight) hours as needed for nausea or vomiting.  01/25/18   Alla Feeling, NP  prochlorperazine (COMPAZINE) 10 MG tablet Take 1 tablet (10 mg total) by mouth every 6 (six) hours as needed for nausea or vomiting. 03/31/19   [name], MD    Family History Family History  Problem Relation Age of Onset   Colon cancer Father  66       currently 55   Colon cancer Maternal Grandfather    Breast cancer Cousin    Thyroid cancer Mother 34       facial radiation for acne as teen   Lymphoma Paternal Aunt 70       deceased 44   Breast cancer Paternal Aunt     Social History Social History   Tobacco Use   Smoking status: Never Smoker   Smokeless tobacco: Never Used  Substance Use Topics   Alcohol use: No    Frequency: Never   Drug use: No     Allergies   Patient has no known allergies.   Review of Systems Review of Systems  Constitutional: Positive for fever.  Gastrointestinal: Positive for abdominal pain and nausea.  Genitourinary: Positive for flank pain.  Allergic/Immunologic: Positive for immunocompromised state.  All other systems reviewed  and are negative.    Physical Exam Updated Vital Signs BP 96/70    Pulse 84    Temp (!) 100.5 F (38.1 C) (Oral)    Resp 12    Ht 5\' 5"  (1.651 m)    Wt 48 kg    LMP 10/23/2015    SpO2 98%    BMI 17.61 kg/m   Physical Exam Vitals signs and nursing note reviewed.  Constitutional:      Appearance: She is well-developed.     Comments: Non toxic in NAD  HENT:     Head: Normocephalic and atraumatic.     Nose: Nose normal.  Eyes:     Conjunctiva/sclera: Conjunctivae normal.  Neck:     Musculoskeletal: Normal range of motion.  Cardiovascular:     Rate and Rhythm: Normal rate and regular rhythm.  Pulmonary:     Effort: Pulmonary effort is normal.     Breath sounds: Normal breath sounds.  Abdominal:     General: Bowel sounds are normal.     Palpations: Abdomen is soft.     Tenderness: There is abdominal tenderness in the right upper quadrant and epigastric area. There is right CVA tenderness. Positive signs include Murphy's sign.     Comments: Mild guarding in RUQ. Negative McBurney's. Active BS to lower quadrants. No distention.   Musculoskeletal: Normal range of motion.  Skin:    General: Skin is warm and dry.     Capillary Refill:  Capillary refill takes less than 2 seconds.  Neurological:     Mental Status: She is alert.  Psychiatric:        Behavior: Behavior normal.      ED Treatments / Results  Labs (all labs ordered are listed, but only abnormal results are displayed) Labs Reviewed  COMPREHENSIVE METABOLIC PANEL - Abnormal; Notable for the following components:      Result Value   Sodium 134 (*)    Potassium 3.4 (*)    Chloride 97 (*)    Glucose, Bld 156 (*)    BUN 24 (*)    Creatinine, Ser 1.23 (*)    Albumin 3.3 (*)    AST 51 (*)    ALT 49 (*)    Alkaline Phosphatase 362 (*)    GFR calc non Af Amer 49 (*)    GFR calc Af Amer 57 (*)    All other components within normal limits  CBC WITH DIFFERENTIAL/PLATELET - Abnormal; Notable for the following components:   WBC 13.7 (*)    RBC 2.54 (*)    Hemoglobin 8.5 (*)    HCT 26.1 (*)    MCV 102.8 (*)    RDW 16.6 (*)    Neutro Abs 11.9 (*)    Lymphs Abs 0.3 (*)    Monocytes Absolute 1.4 (*)    Abs Immature Granulocytes 0.09 (*)    All other components within normal limits  LIPASE, BLOOD - Abnormal; Notable for the following components:   Lipase 386 (*)    All other components within normal limits  PROTIME-INR - Abnormal; Notable for the following components:   Prothrombin Time 18.1 (*)    INR 1.5 (*)    All other components within normal limits  APTT - Abnormal; Notable for the following components:   aPTT 54 (*)    All other components within normal limits  URINALYSIS, ROUTINE W REFLEX MICROSCOPIC - Abnormal; Notable for the following components:   Color, Urine AMBER (*)    APPearance HAZY (*)  Protein, ur 100 (*)    Bacteria, UA RARE (*)    All other components within normal limits  SARS CORONAVIRUS 2 (HOSPITAL ORDER, PERFORMED IN [facility])  CULTURE, BLOOD (ROUTINE X 2)  CULTURE, BLOOD (ROUTINE X 2)  URINE CULTURE  LACTIC ACID, PLASMA  LACTIC ACID, PLASMA    EKG EKG Interpretation  Date/Time:  Sunday April 09 2019 10:47:37 EDT Ventricular Rate:  104 PR Interval:    QRS Duration: 90 QT Interval:  322 QTC Calculation: 424 R Axis:   94 Text Interpretation:  Sinus tachycardia Ventricular premature complex Aberrant complex Borderline right axis deviation Since last tracing rate faster Confirmed by Dorie Rank [phone]) on 04/09/2019 10:50:21 AM   Radiology Ct Abdomen Pelvis W Contrast  Result Date: 04/09/2019 CLINICAL DATA:  RIGHT upper quadrant pain. Pancreatic cancer. Liver metastasis. EXAM: CT ABDOMEN AND PELVIS WITH CONTRAST TECHNIQUE: Multidetector CT imaging of the abdomen and pelvis was performed using the standard protocol following bolus administration of intravenous contrast. CONTRAST:  16mL OMNIPAQUE IOHEXOL 300 MG/ML  SOLN COMPARISON:  CT 03/21/2019 FINDINGS: Lower chest: Lung bases are clear. Hepatobiliary: Multifocal hepatic metastasis which are not changed significantly from comparison CT 03/21/2019. Example lesion inferior RIGHT hepatic lobe measures 3.4 cm compared with 3.5 cm. Central LEFT hepatic lobe lesion measures 3.0 cm compared with 3.0 (image 30/2). Portal veins are patent. The gallbladder is nondistended. There are multiple gallstones within lumen gallbladder. The gallbladder measures 2 cm in diameter decreased from 3 cm on prior. Small amount pericholecystic fluid similar. Common bile duct is normal caliber. Pancreas: Dilatation of the pancreatic duct involving the body and tail unchanged from comparison exam. Pancreatic obstructing lesion is difficult to define. The head appears normal. Normal distal common bile duct and pancreatic duct. Spleen: Normal spleen Adrenals/urinary tract: Adrenal glands and kidneys are normal. The ureters and bladder normal. Stomach/Bowel: Stomach, small bowel, appendix, and cecum are normal. The colon and rectosigmoid colon are normal. Vascular/Lymphatic: Abdominal aorta is normal caliber. No periportal or retroperitoneal adenopathy. No pelvic adenopathy.  Reproductive: Uterus normal.  Small leiomyoma in the fundus. Other: Small amount free fluid the pelvis along the RIGHT pericolic gutter unchanged. Musculoskeletal: No aggressive osseous lesion. IMPRESSION: 1. Multiple gallstones in lumen gallbladder without evidence acute cholecystitis. No significant change from prior. No biliary duct dilatation. Common bile duct normal. 2. Widespread hepatic metastasis unchanged from comparison CT. 3. Chronic pancreatic duct dilatation in the body and tail unchanged. 4. No bowel obstruction. 5. Small amount of intraperitoneal free fluid unchanged. Electronically Signed   By: Suzy Bouchard M.D.   On: 04/09/2019 13:26   Dg Chest Port 1 View  Result Date: 04/09/2019 CLINICAL DATA:  Sepsis. Fever. Abdominal pain. Metastatic pancreatic carcinoma. EXAM: PORTABLE CHEST 1 VIEW COMPARISON:  12/14/2018 FINDINGS: The heart size and mediastinal contours are within normal limits. Right-sided power port remains in appropriate position. Both lungs are clear. The visualized skeletal structures are unremarkable. IMPRESSION: No active cardiopulmonary disease. Electronically Signed   By: Marlaine Hind M.D.   On: 04/09/2019 11:03    Procedures Procedures (including critical care time)  Medications Ordered in ED Medications  sodium chloride (PF) 0.9 % injection (has no administration in time range)  acetaminophen (TYLENOL) tablet 650 mg (650 mg Oral Given 04/09/19 1139)  sodium chloride 0.9 % bolus 500 mL (0 mLs Intravenous Stopped 04/09/19 1450)  iohexol (OMNIPAQUE) 300 MG/ML solution 100 mL (100 mLs Intravenous Contrast Given 04/09/19 1257)     Initial Impression /  Assessment and Plan / ED Course  I have reviewed the triage vital signs and the nursing notes.  Pertinent labs & imaging results that were available during my care of the patient were reviewed by me and considered in my medical decision making (see chart for details).  Clinical Course as of Apr 09 1503  Sun Apr 09, 2019  1026 Temp(!): 100.5 F (38.1 C) [CG]  1026 Pulse Rate(!): 108 [CG]  1102 Will hold off on antibiotics and large IVF given clinical exam. She is non toxic, normotensive.  Discussed with patient standard of care with SIRS and immunocompromise includes early antibiotics however patient would like to defer abx at this time and wait for work up. She is worried about how this may affect her new chemo regimen starting tomorrow. Discussed with EDP who agrees with this plan. Pending labs, UA to determine source    [CG]  1205 WBC(!): 13.7 [CG]  1205 Hemoglobin(!): 8.5 [CG]  1205 NEUT#(!): 11.9 [CG]  1206 Lactic Acid, Venous: 1.3 [CG]  1207 Sinus tachycardia Ventricular premature complex Aberrant complex Borderline right axis deviation Since last tracing rate faster Confirmed by Dorie Rank [phone]) on 04/09/2019 10:50:21 AM  ED EKG 12-Lead [CG]  1207 Potassium(!): 3.4 [CG]  1207 Baseline   Creatinine(!): 1.23 [CG]  1207 Baseline   AST(!): 51 [CG]  1207 Baseline   ALT(!): 49 [CG]  1207 Baseline, similar to previous   Alkaline Phosphatase(!): 362 [CG]  1207 GFR, Est Non African American(!): 49 [CG]  1217 Lipase(!): 386 [CG]  1259 SARS Coronavirus 2: NEGATIVE [CG]  1408 Lactic Acid, Venous: 1.2 [CG]  1408 Bacteria, UA(!): RARE [CG]  1408 WBC, UA: 0-5 [CG]  1409 1. Multiple gallstones in lumen gallbladder without evidence acute cholecystitis. No significant change from prior. No biliary duct dilatation. Common bile duct normal. 2. Widespread hepatic metastasis unchanged from comparison CT. 3. Chronic pancreatic duct dilatation in the body and tail unchanged. 4. No bowel obstruction. 5. Small amount of intraperitoneal free fluid unchanged.  CT ABDOMEN PELVIS W CONTRAST [CG]    Clinical Course User Index [CG] Kinnie Feil, PA-C    Highest on DDX is acute cholecystitis versus pyelonephritis.  Patient arrives with oral temp 100.5, tachycardia 108.  Hemodynamically stable.   Nontoxic-appearing.  Very mild right upper quadrant tenderness, CVAT on exam.  No peritonitis.  Given history of immunosuppression, high fever yesterday, tachycardia and triage vital signs sepsis order set utilized to initiate infectious work-up.  Small IVF bolus given for tachycardia although she is HD stable.  Given her clinical presentation, HD stability and improved tachycardia empiric broad-spectrum antibiotics held here in the ER.  Patient preferred this as she does not want antibiotics unless we determine the specific source.  This was discussed with EDP who also agreed.  ER work-up reviewed by me remarkable as above.  Leukocytosis with left shift, normal lactic acid x2.  Baseline anemia 8.5.  Creatinine at baseline.  LFTs at baseline.  Lipase is markedly elevated today but she has no epigastric tenderness.  CT A/P shows previously, stable cholelithiasis, sludge and gallbladder thickening without acute cholecystitis.  She has chronic pancreatic duct dilation that is unchanged.  Given her lack of epigastric abdominal tenderness I have low suspicion for acute pancreatitis.  COVID negative.   Patient was discussed with on-call oncologist Dr. Alen Blew.  Given benign work-up, clinical presentation overall well-appearing on repeat exam he does not recommend emergent IV antibiotics, admission, suspects fever may have been due to  delayed reaction from chemotherapy infusion on 8/3.  We have sent urine and blood cultures.  Given overall reassuring work-up, clinical improvement patient will be discharged.  She has follow-up with her oncologist tomorrow.  Discussed at length etiology of fever still unclear although could be a reaction to chemotherapy and less likely early cholecystitis.  She was instructed to monitor her symptoms and return to the ER immediately for fever, persistent or worsening RUQ abdominal pain, vomiting.  Discussed with EDP.  Final Clinical Impressions(s) / ED Diagnoses   Final  diagnoses:  Fever in adult  Calculus of gallbladder without cholecystitis without obstruction  SIRS (systemic inflammatory response syndrome) [facility])    ED Discharge Orders    None       Arlean Hopping 04/09/19 1504    Dorie Rank, MD 04/11/19 1205

## 2019-04-09 NOTE — ED Notes (Signed)
Patient does not want to Pregnancy Test.

## 2019-04-10 ENCOUNTER — Other Ambulatory Visit: Payer: Self-pay | Admitting: Hematology

## 2019-04-10 ENCOUNTER — Telehealth: Payer: Self-pay | Admitting: *Deleted

## 2019-04-10 ENCOUNTER — Other Ambulatory Visit: Payer: 59

## 2019-04-10 ENCOUNTER — Ambulatory Visit: Payer: 59

## 2019-04-10 LAB — URINE CULTURE: Culture: NO GROWTH

## 2019-04-10 NOTE — Telephone Encounter (Signed)
I called pt back regarding her visit to ED yesterday for fever, lab and scan reviewed, blood culture was negative. I recommend her to start Xeloda today as scheduled, and will see her back to start IV chemo this Thursday. She voiced good understanding and agrees with the plan.  [name]  04/10/2019

## 2019-04-10 NOTE — Telephone Encounter (Signed)
Received vm call from pt stating that she spent 5 hr in ED yest.  She stats she ran fever, highest 102.3 on Sat. & 101.8 on Sunday.  She states they did all kinds of test but didn't find anything that alarmed them.  She was sent home.  Temp this am was 99.3 which is lowest that it has been since Saturday.  She is to start a new regimen today & has f/u on Thurs.  She would like to know what to do.  Message routed to [place].

## 2019-04-12 NOTE — Progress Notes (Addendum)
[place]   Telephone:([phone]) [phone] Fax:([phone]) [phone]   Clinic Follow up Note   Patient Care Team: [name], MD as PCP - General (Family Medicine) [name], MD as Consulting Physician (Otolaryngology) [name], MD as Consulting Physician (Oncology) 04/13/2019  CHIEF COMPLAINT: Follow-up pancreas cancer  SUMMARY OF ONCOLOGIC HISTORY: Oncology History Overview Note  Cancer Staging Pancreatic cancer [facility]) Staging form: Exocrine Pancreas, AJCC 8th Edition - Clinical stage from 08/06/2017: Stage IV (cTX, cN0, pM1) - Signed by [name], MD on 08/12/2017     Pancreatic cancer ([place])  07/23/2017 Imaging   US abdomen limited RUQ 07/23/17 IMPRESSION: 1. Cholelithiasis.  No secondary signs of acute cholecystitis. 2. Multiple solid liver masses measuring up to 6.5 cm in the left lobe of the liver, evaluation for metastatic disease is recommended. These results will be called to the ordering clinician or representative by the Radiologist Assistant, and communication documented in the PACS or zVision Dashboard.   07/23/2017 Imaging   CT Abdomen W Contrast 07/23/17 IMPRESSION: 1. Widespread metastatic disease throughout the liver. No clear primary malignancy identified in the abdomen. The pelvis was not imaged. Tissue sampling recommended. 2. Probable adenopathy superior to the pancreatic tail. No evidence of pancreatic mass. 3. Suspected incidental hemangioma inferiorly in the right hepatic lobe. 4. Nonspecific nodularity in the breasts. The patient has undergone recent (03/25/2017 and 04/01/2017) mammography and ultrasound.   07/29/2017 Initial Diagnosis   Metastasis to liver of unknown origin ([place])   07/29/2017 PET scan   PET 07/29/17  IMPRESSION: 1. Numerous bulky liver masses are hypermetabolic compatible with malignancy. 2. Accentuated activity within or along the pancreatic tail, likely represent a primary pancreatic tumor. Consider pancreatic  protocol MRI to further work this up. 3. The peripancreatic lymph node shown above the pancreatic tail is mildly hypermetabolic favoring malignancy. 4.  Prominent stool throughout the colon favors constipation. 5. 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   08/05/2017 Pathology Results   Liver Biopsy  Diagnosis 08/05/17 Liver, needle/core biopsy - CARCINOMA. - SEE COMMENT. Microscopic Comment The malignant cells are positive for cytokeratin 7. They are negative for arginase, CDX2, cytokeratin 20, estrogen receptor, GATA-3, GCDFP, Glypican 3, Hep Par 1, Napsin A, and TTF-1. This immunohistochemical is nonspecific. Possibly primary sources include pancreatobiliary and upper gastrointestinal. Radiologic correlation is necessary. Of note, organ specific markers (GATA-3, GCDFP-breast, TTF-1, Napsin A-lung, and CDX2-colon) are negative. (JBK:ecj 08/09/2017)   08/21/2017 - 02/10/2018 Chemotherapy   FOLFIRINOX every 2 weeks starting 08/21/17. Dose reduced due to neuropahty and cytopenia    10/14/2017 Imaging   CT CAP WO Contrast 10/14/17 IMPRESSION: Evidence of known numerous liver metastases without significant interval change. No other evidence of metastatic disease within the chest, abdomen or pelvis. Cholelithiasis. Tiny pericardial effusion.   12/02/2017 Genetic Testing   Negative for pathogenic mutation.  The genes analyzed were the 83 genes on [facility]'s Multi-Cancer panel (ALK, APC, ATM, AXIN2, BAP1, BARD1, BLM, BMPR1A, BRCA1, BRCA2, BRIP1, CASR, CDC73, CDH1, CDK4, CDKN1B, CDKN1C, CDKN2A, CEBPA, CHEK2, CTNNA1, DICER1, DIS3L2, EGFR, EPCAM, FH, FLCN, GATA2, GPC3, GREM1, HOXB13, HRAS, KIT, MAX, MEN1, MET, MITF, MLH1, MSH2, MSH3, MSH6, MUTYH, NBN, NF1, NF2, NTHL1, PALB2, PDGFRA, PHOX2B, PMS2, POLD1, POLE, POT1, PRKAR1A, PTCH1, PTEN, RAD50, RAD51C, RAD51D, RB1, RECQL4, RET, RUNX1, SDHA, SDHAF2, SDHB, SDHC, SDHD, SMAD4, SMARCA4, SMARCB1, SMARCE1, STK11, SUFU, TERC, TERT, TMEM127, TP53, TSC1, TSC2,  VHL, WRN, WT1).   12/17/2017 PET scan   IMPRESSION: 1. Although the liver metastases are still visible on the CT  images, they are significantly smaller and retain no abnormal metabolic activity, consistent with response to therapy. 2. No abnormal activity within the pancreas. 3. No disease progression identified. 4. Cholelithiasis.   02/21/2018 PET scan   02/21/2018 PET Scan  IMPRESSION: 1. There are 2 new small nodules identified within the right lung which measure up to 5 mm. These are too small to reliably characterize by PET-CT, but warrant close interval follow-up. 2. Again noted are multifocal liver metastasis. The target lesion within the left lobe is slightly decreased in size when compared with the previous exam. Similar to previous exam there is no abnormal hypermetabolism above background liver activity identified within the liver lesions. 3. Gallstones.   02/23/2018 - 09/04/2018 Chemotherapy   5-FU pump infusion and liposomal irinotecan, every 2 weeks.  First cycle dose reduced due to cytopenia in the travel. Stopped on 08/25/2018 due to disease progression.    05/17/2018 Imaging   05/17/2018 PET Scan IMPRESSION: 1. Mixed response. The 2 small right lung nodules have decreased in size in the interval. Single focus of increased uptake within the liver is new from 02/21/2018 and concerning for recurrent metabolically active liver metastasis. 2. Splenomegaly.  New from previous exam. 3. Diffusely increased bone marrow activity, likely reflecting treatment related changes.   09/01/2018 PET scan   PET 09/01/18 IMPRESSION: 1. Unfortunately there recurrence of hepatic metastasis with multiple new hypermetabolic lesions in LEFT and RIGHT hepatic lobe. 2. New extra hepatic site of malignancy which appears associated the mid pancreas. Difficult to define lesion on noncontrast exam. 3. Diffuse marrow activity is favored benign. 4. No evidence of pulmonary metastasis.   12/14/2018  Imaging   CT CAP  IMPRESSION: CT demonstrates acute cholecystitis, with choledocholithiasis of the cystic duct.  These results were discussed by telephone at the time of interpretation on 12/14/2018 at 5:05 pm with Dr. Burr Medico.  Redemonstration of known liver metastases, with positive response to therapy, interval reduction in size of all lesions, with multiple demonstrating significant internal necrosis.  No acute finding of the chest.  Ancillary findings as above.   03/21/2019 Imaging   CT CAP W Contrast IMPRESSION: 1. Numerous rim enhancing liver metastases show overall trend towards mild progression although 1 of the index lesions is stable in the interval. 2. Interval development of portocaval lymphadenopathy concerning for disease progression. 3. Slight progression of main duct dilatation in the pancreas with abrupt cut off in the region of the body. 4. Interval development of a 9 mm subpleural nodule at the right base along the posterior hemidiaphragm. Close attention on follow-up recommended as metastatic disease not excluded. 5. New splenomegaly. 6. Cholelithiasis with diffuse gallbladder wall thickening. Gallbladder wall thickening is decreased in the interval. Stones and sludge noted in the gallbladder lumen.    Chemotherapy   Third-line GTX with Xeloda BID day 1-14, off for 1 week with Docetaxel and Gemcitbine on day 4 and day 11 every 3 weeks starting oral chemo on 04/10/19.    04/13/2019 -  Chemotherapy   The patient had DOCEtaxel (TAXOTERE) 40 mg in sodium chloride 0.9 % 100 mL chemo infusion, 30 mg/m2 = 40 mg (100 % of original dose 30 mg/m2), Intravenous,  Once, 1 of 4 cycles Dose modification: 30 mg/m2 (original dose 30 mg/m2, Cycle 1, Reason: Provider Judgment) Administration: 40 mg (04/13/2019) gemcitabine (GEMZAR) 1,102 mg in sodium chloride 0.9 % 250 mL chemo infusion, 750 mg/m2 = 1,102 mg (100 % of original dose 750 mg/m2), Intravenous,  Once, 1 of  4  cycles Dose modification: 750 mg/m2 (original dose 750 mg/m2, Cycle 1, Reason: Provider Judgment) Administration: 1,102 mg (04/13/2019)  for chemotherapy treatment.    Pancreatic cancer metastasized to liver ([place])  08/11/2017 Initial Diagnosis   Pancreatic cancer metastasized to liver ([place])   09/12/2018 - 03/27/2019 Chemotherapy   second-line Gemcitabine and Abraxane for 2 weeks on, 1 week off starting 09/12/2018. Due to neuropathy, I will start her with dose reduced Abraxane. Stopped after 03/27/19 due to disease progression.     Chemotherapy   Third-line GTX with Xeloda BID day 1-14, off for 1 week with Docetaxel and Gemcitbine on day 4 and day 11 every 3 weeks starting oral chemo on 04/10/19.    04/13/2019 -  Chemotherapy   The patient had DOCEtaxel (TAXOTERE) 40 mg in sodium chloride 0.9 % 100 mL chemo infusion, 30 mg/m2 = 40 mg (100 % of original dose 30 mg/m2), Intravenous,  Once, 1 of 4 cycles Dose modification: 30 mg/m2 (original dose 30 mg/m2, Cycle 1, Reason: Provider Judgment) Administration: 40 mg (04/13/2019) gemcitabine (GEMZAR) 1,102 mg in sodium chloride 0.9 % 250 mL chemo infusion, 750 mg/m2 = 1,102 mg (100 % of original dose 750 mg/m2), Intravenous,  Once, 1 of 4 cycles Dose modification: 750 mg/m2 (original dose 750 mg/m2, Cycle 1, Reason: Provider Judgment) Administration: 1,102 mg (04/13/2019)  for chemotherapy treatment.      CURRENT THERAPY: Third-line GTX with Xeloda BID day 1-14, off for 1 week with Docetaxel and Gemcitbine on day 4 and day 11 every 3 weeks starting oral chemo on 04/10/19.   INTERVAL HISTORY: Ms. Vasconcelos returns for follow-up and treatment as scheduled.  She was seen in the ED on 8/16 for abdominal pain and fever.  Blood and urine cultures negative.  Chest x-ray was unremarkable, CT AP was stable from 03/21/2019.  She was discharged home.  She began oral Xeloda on 04/10/2019. She continues to have RUQ pain that radiates to back and now spreads across her upper  abdomen. Pain is 5/10. Has taken few doses of tramadol which helps somewhat. Abdomen feels distended. She vomited after dinner last night. Feels full quickly. Denies constipation or diarrhea. Denies bleeding. She has low grade temp today but no fever this week. Denies hand/foot redness or pain, she has started using thicker lotion. Denies mucositis.    MEDICAL HISTORY:  Past Medical History:  Diagnosis Date  . Diarrhea of presumed infectious origin 01/12/2018  . Gastroenteritis 01/12/2018  . Genetic testing 12/02/2017   Multi-Cancer panel (83 genes) @ [facility] - No pathogenic mutations detected  . Meniere disease 2016  . Pancreatitis     SURGICAL HISTORY: Past Surgical History:  Procedure Laterality Date  . CERVICAL ABLATION    . ENDOMETRIAL ABLATION  2011  . IR CHOLANGIOGRAM EXISTING TUBE  01/09/2019  . IR FLUORO GUIDE PORT INSERTION RIGHT  08/12/2017  . IR PERC CHOLECYSTOSTOMY  12/15/2018  . IR US GUIDE VASC ACCESS RIGHT  08/12/2017  . [place]    I have reviewed the social history and family history with the patient and they are unchanged from previous note.  ALLERGIES:  has No Known Allergies.  MEDICATIONS:  Current Outpatient Medications  Medication Sig Dispense Refill  . capecitabine (XELODA) 500 MG tablet Take 2 tablets (1,000 mg total) by mouth 2 (two) times daily after a meal. Take on days 1-14 of each 21 day cycle 56 tablet 2  . b complex vitamins tablet Take 1 tablet by mouth daily.    Marland Kitchen  cyclobenzaprine (FLEXERIL) 5 MG tablet Take 1 tablet (5 mg total) by mouth 3 (three) times daily as needed for muscle spasms. 30 tablet 0  . gabapentin (NEURONTIN) 100 MG capsule TAKE ONE CAPSULE BY MOUTH THREE TIMES DAILY (Patient taking differently: Take 100 mg by mouth 2 (two) times daily. ) 270 capsule 1  . levofloxacin (LEVAQUIN) 500 MG tablet Take 1 tablet (500 mg total) by mouth daily. 5 tablet 0  . loperamide (IMODIUM) 2 MG capsule Take 1 capsule (2 mg total) by mouth  as needed for diarrhea or loose stools. 30 capsule 0  . loratadine (CLARITIN) 10 MG tablet Take 10 mg by mouth daily as needed for allergies.    . magic mouthwash w/lidocaine SOLN Take 10 mLs by mouth 3 (three) times daily as needed for mouth pain. Swish and spit 10 ML by mouth 3 times daily as needed for mouth pain. (Patient not taking: Reported on 04/09/2019) 240 mL 0  . meclizine (ANTIVERT) 25 MG tablet Take 25 mg by mouth 3 (three) times daily as needed for dizziness.    . ondansetron (ZOFRAN-ODT) 8 MG disintegrating tablet TAKE 1 TABLET BY MOUTH EVERY 8 HOURS AS NEEDED FOR NAUSE OR VOMITING (Patient taking differently: Take 8 mg by mouth every 8 (eight) hours as needed for nausea or vomiting. ) 40 tablet 0  . potassium chloride (K-DUR) 10 MEQ tablet Take 1 tablet (10 mEq total) by mouth 4 (four) times daily. (Patient taking differently: Take 30 mEq by mouth daily. ) 360 tablet 2  . prochlorperazine (COMPAZINE) 10 MG tablet Take 1 tablet (10 mg total) by mouth every 6 (six) hours as needed for nausea or vomiting. 40 tablet 2  . PROMACTA 25 MG tablet TAKE 1 TABLET BY MOUTH ONCE DAILY ON AN EMPTY STOMACH  AT LEAST 1 HOUR BEFORE OR 2 HOURS AFTER A MEAL (Patient taking differently: Take 25 mg by mouth daily. ) 30 tablet 0  . Rivaroxaban (XARELTO) 15 MG TABS tablet Take 1 tablet (15 mg total) by mouth daily. 90 tablet 1  . traMADol (ULTRAM) 50 MG tablet Take 1 tablet (50 mg total) by mouth every 6 (six) hours as needed for moderate pain or severe pain. 30 tablet 0   No current facility-administered medications for this visit.    Facility-Administered Medications Ordered in Other Visits  Medication Dose Route Frequency Provider Last Rate Last Dose  . sodium chloride flush (NS) 0.9 % injection 10 mL  10 mL Intracatheter Once [name], MD      . sodium chloride flush (NS) 0.9 % injection 10 mL  10 mL Intracatheter PRN [name], MD   10 mL at 04/13/19 1628    PHYSICAL EXAMINATION: ECOG PERFORMANCE  STATUS: 1 - Symptomatic but completely ambulatory  Vitals:   04/13/19 1138  BP: 118/74  Pulse: 86  Resp: 18  Temp: 99.1 F (37.3 C)  SpO2: 100%   Filed Weights   04/13/19 1144  Weight: 103 lb 5 oz (46.9 kg)    GENERAL:alert, no distress and comfortable SKIN:no rash  EYES:  sclera clear LYMPH:  no palpable cervical or supraclavicular lymphadenopathy LUNGS: clear to auscultation with normal breathing effort HEART: regular rate & rhythm, no lower extremity edema ABDOMEN: Non-distended. RUQ and epigastric tenderness to deep palpation. No rebound tenderness or guarding  Musculoskeletal:no cyanosis of digits NEURO: alert & oriented x 3 with fluent speech, normal gait PAC without erythema   LABORATORY DATA:  I have reviewed the data as listed  CBC Latest Ref Rng & Units 04/13/2019 04/09/2019 03/27/2019  WBC 4.0 - 10.5 K/uL 8.9 13.7(H) 20.8(H)  Hemoglobin 12.0 - 15.0 g/dL 8.4(L) 8.5(L) 9.1(L)  Hematocrit 36.0 - 46.0 % 26.5(L) 26.1(L) 27.4(L)  Platelets 150 - 400 K/uL 599(H) 360 217     CMP Latest Ref Rng & Units 04/13/2019 04/09/2019 03/27/2019  Glucose 70 - 99 mg/dL 134(H) 156(H) 133(H)  BUN 6 - 20 mg/dL 19 24(H) 16  Creatinine 0.44 - 1.00 mg/dL 1.15(H) 1.23(H) 1.23(H)  Sodium 135 - 145 mmol/L 136 134(L) 135  Potassium 3.5 - 5.1 mmol/L 3.8 3.4(L) 3.6  Chloride 98 - 111 mmol/L 100 97(L) 102  CO2 22 - 32 mmol/L 24 25 25   Calcium 8.9 - 10.3 mg/dL 9.6 9.1 8.7(L)  Total Protein 6.5 - 8.1 g/dL 7.8 7.7 7.5  Total Bilirubin 0.3 - 1.2 mg/dL 0.7 1.2 1.0  Alkaline Phos 38 - 126 U/L 480(H) 362(H) 378(H)  AST 15 - 41 U/L 62(H) 51(H) 63(H)  ALT 0 - 44 U/L 54(H) 49(H) 56(H)      RADIOGRAPHIC STUDIES: I have personally reviewed the radiological images as listed and agreed with the findings in the report. No results found.   ASSESSMENT & PLAN:  SIDNI FUSCO a 58 y.o.femalewith   1. 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 She was treated withfirst linechemoFOLFIRINOX with dose reduction. Due to neuropathyand cytopenia, chemo changed tosecond line 5-FU pump infusion and liposomal irinotecan every 2 weekswith dose reduction -Unfortunately she had disease progression withworseningliver mets.  -She started second-line Gemcitabine and Abraxane2 weeks on/1 week offon 09/12/18. She tolerateswell with no significant side effects except hair lossand spiked fever afterchemo -she developedacute cholecystitisand was hospitalized, s/p perc chole drain; she recovered well and resumed chemotherapy. After cycle 6 day 1 (01/02/19) she developed recurrent fever spikes and pancytopenia. Treatment was held for possible recurrent infection on 5/18. -Drain exchange was attempted in IR on 5/18 but drain fell out. Her case was discussed with IR and surgery. Surgery not recommended due to concern for disease progression while off chemo  -she is at high risk for recurrent infectiononchemotherapy -s/p cycle 10 day 1 on 03/27/19, ultimately discontinued due to disease progression  -she started third line GTX on 04/10/19  2. Intermittent fever spikes, abdominal pain -she presented to ED for this on 8/16. CT showed stable disease. Work up essentially negative -fever spikes could be related to tumor, chemo, or infection although no evidence of that on CT -continue tramadol PRN for pain   3. Weight lossandmalnutrition -Due to chemo and underlying metastatic cancer. -she uses supplements  4. Transaminitis -Due to underlying chemo and malignancy  5. Pancytopenia, secondary to chemo -resolved   6. Hypokalemia, CKD Stage III - on KCL supplements  7.Peripheral neuropathy, grade 2 -Currently on Neurontin 200 mg at night and B complexes. -stable overall, mostly in her feet   8.Acute cholecystitis, status post cholecystectomy tube placement 4/23per IR - surgery was not recommended due to high risk of  complication, and concerns for cancer progression when holding chemotherapy for surgery -Drainwas placed during hospitalization, withslightlybloody drainagedaily -Her CT CAP from 12/14/18 -numerus gallstones. -Drain fell out on 5/18; imaging in 12/2018 consistent with cholecystitis with cystic duct obstruction. No leak on HIDA. Holding intervention given she is asymptomatic and invasive procedure would delay chemo. -[place] on 6/5 was done due to recurrent fever after resuming chemo; no indication for gallbladder drainage at this time  9. LE DVT -Diagnosed on December 15, 2018, when she was hospitalized for cholecystitis -Due to her bloody biliary drainage, and moderate thrombocytopenia from chemotherapy, she is on low-dosexarelto78m daily (no loading dose)  10.Goal of care discussion -treatment goal is palliative  Disposition: Ms. HYglesiasappears stable. She continues to have low grade fevers and abdominal pain. Her CT in ED was stable. Work up essentially negative. No peritoneal signs on exam today. She was seen with Dr. FBurr Medicowho discussed her fever could be related to tumor, chemo, or infection, although no evidence of cholecystitis on CT. Will give 5 day course of levaquin 500 mg daily to see if fever resolves. She knows to call with worsening abdominal pain, fever, n/v, or new concerns.   Labs reviewed. She will proceed with day 4 gemcitabine and taxotere today. She began cycle 1 Xeloda 1000 mg BID on 8/11, she is tolerating well so far. She will continue Xeloda for total 14 days. She will return for f/u and day 11 chemo in one week.   All questions were answered. The patient knows to call the clinic with any problems, questions or concerns. No barriers to learning was detected.     LAlla Feeling NP 04/13/19   Addendum  I have seen the patient, examined her. I agree with the assessment and and plan and have edited the notes.   JThayer Headingshas had episodes of fever and right abdominal  pain 4-5 days ago, seen at ED. I reviewed her CT scan, which showed no evidence of acute cholecystitis.  She does have tenderness in the right upper quadrant, I think her pain is probably related to her liver metastasis.  Fever could be related to infection or tumor.  We will give her a course of Levaquin, and proceed with first cycle docetaxel and gemcitabine today.  Lab reviewed, adequate for treatment.  She will continue oral Xeloda, and [place].  YTruitt Merle 04/13/2019

## 2019-04-13 ENCOUNTER — Inpatient Hospital Stay (HOSPITAL_BASED_OUTPATIENT_CLINIC_OR_DEPARTMENT_OTHER): Payer: 59 | Admitting: Nurse Practitioner

## 2019-04-13 ENCOUNTER — Other Ambulatory Visit: Payer: Self-pay

## 2019-04-13 ENCOUNTER — Encounter: Payer: Self-pay | Admitting: Nurse Practitioner

## 2019-04-13 ENCOUNTER — Telehealth: Payer: Self-pay | Admitting: Nurse Practitioner

## 2019-04-13 ENCOUNTER — Inpatient Hospital Stay: Payer: 59

## 2019-04-13 VITALS — BP 113/76 | HR 77 | Temp 98.5°F | Resp 16

## 2019-04-13 VITALS — BP 118/74 | HR 86 | Temp 99.1°F | Resp 18 | Ht 65.0 in | Wt 103.3 lb

## 2019-04-13 DIAGNOSIS — C787 Secondary malignant neoplasm of liver and intrahepatic bile duct: Secondary | ICD-10-CM | POA: Diagnosis not present

## 2019-04-13 DIAGNOSIS — Z95828 Presence of other vascular implants and grafts: Secondary | ICD-10-CM

## 2019-04-13 DIAGNOSIS — C251 Malignant neoplasm of body of pancreas: Secondary | ICD-10-CM

## 2019-04-13 DIAGNOSIS — Z5111 Encounter for antineoplastic chemotherapy: Secondary | ICD-10-CM | POA: Diagnosis not present

## 2019-04-13 DIAGNOSIS — Z7189 Other specified counseling: Secondary | ICD-10-CM

## 2019-04-13 DIAGNOSIS — C259 Malignant neoplasm of pancreas, unspecified: Secondary | ICD-10-CM | POA: Diagnosis not present

## 2019-04-13 LAB — COMPREHENSIVE METABOLIC PANEL
ALT: 54 U/L — ABNORMAL HIGH (ref 0–44)
AST: 62 U/L — ABNORMAL HIGH (ref 15–41)
Albumin: 3.1 g/dL — ABNORMAL LOW (ref 3.5–5.0)
Alkaline Phosphatase: 480 U/L — ABNORMAL HIGH (ref 38–126)
Anion gap: 12 (ref 5–15)
BUN: 19 mg/dL (ref 6–20)
CO2: 24 mmol/L (ref 22–32)
Calcium: 9.6 mg/dL (ref 8.9–10.3)
Chloride: 100 mmol/L (ref 98–111)
Creatinine, Ser: 1.15 mg/dL — ABNORMAL HIGH (ref 0.44–1.00)
GFR calc Af Amer: >60 mL/min (ref >60)
GFR calc non Af Amer: 53 mL/min — ABNORMAL LOW (ref >60)
Glucose, Bld: 134 mg/dL — ABNORMAL HIGH (ref 70–99)
Potassium: 3.8 mmol/L (ref 3.5–5.1)
Sodium: 136 mmol/L (ref 135–145)
Total Bilirubin: 0.7 mg/dL (ref 0.3–1.2)
Total Protein: 7.8 g/dL (ref 6.5–8.1)

## 2019-04-13 LAB — CBC WITH DIFFERENTIAL/PLATELET
Abs Immature Granulocytes: 0.05 10*3/uL (ref 0.00–0.07)
Basophils Absolute: 0.1 10*3/uL (ref 0.0–0.1)
Basophils Relative: 1 %
Eosinophils Absolute: 0 10*3/uL (ref 0.0–0.5)
Eosinophils Relative: 0 %
HCT: 26.5 % — ABNORMAL LOW (ref 36.0–46.0)
Hemoglobin: 8.4 g/dL — ABNORMAL LOW (ref 12.0–15.0)
Immature Granulocytes: 1 %
Lymphocytes Relative: 4 %
Lymphs Abs: 0.4 10*3/uL — ABNORMAL LOW (ref 0.7–4.0)
MCH: 31.9 pg (ref 26.0–34.0)
MCHC: 31.7 g/dL (ref 30.0–36.0)
MCV: 100.8 fL — ABNORMAL HIGH (ref 80.0–100.0)
Monocytes Absolute: 1 10*3/uL (ref 0.1–1.0)
Monocytes Relative: 12 %
Neutro Abs: 7.4 10*3/uL (ref 1.7–7.7)
Neutrophils Relative %: 82 %
Platelets: 599 10*3/uL — ABNORMAL HIGH (ref 150–400)
RBC: 2.63 MIL/uL — ABNORMAL LOW (ref 3.87–5.11)
RDW: 16.5 % — ABNORMAL HIGH (ref 11.5–15.5)
WBC: 8.9 10*3/uL (ref 4.0–10.5)
nRBC: 0 % (ref 0.0–0.2)

## 2019-04-13 MED ORDER — SODIUM CHLORIDE 0.9 % IV SOLN
750.0000 mg/m2 | Freq: Once | INTRAVENOUS | Status: AC
  Administered 2019-04-13: 1102 mg via INTRAVENOUS
  Filled 2019-04-13: qty 28.98

## 2019-04-13 MED ORDER — DEXAMETHASONE SODIUM PHOSPHATE 10 MG/ML IJ SOLN
10.0000 mg | Freq: Once | INTRAMUSCULAR | Status: AC
  Administered 2019-04-13: 10 mg via INTRAVENOUS

## 2019-04-13 MED ORDER — LEVOFLOXACIN 500 MG PO TABS: 500.0000 mg | ORAL_TABLET | Freq: Every day | ORAL | 0 refills | Status: DC

## 2019-04-13 MED ORDER — DEXAMETHASONE SODIUM PHOSPHATE 10 MG/ML IJ SOLN
INTRAMUSCULAR | Status: AC
  Filled 2019-04-13: qty 1

## 2019-04-13 MED ORDER — SODIUM CHLORIDE 0.9% FLUSH
10.0000 mL | Freq: Once | INTRAVENOUS | Status: AC
  Administered 2019-04-13: 12:00:00 10 mL
  Filled 2019-04-13: qty 10

## 2019-04-13 MED ORDER — SODIUM CHLORIDE 0.9% FLUSH
10.0000 mL | INTRAVENOUS | Status: DC | PRN
  Administered 2019-04-13: 10 mL
  Filled 2019-04-13: qty 10

## 2019-04-13 MED ORDER — SODIUM CHLORIDE 0.9 % IV SOLN
Freq: Once | INTRAVENOUS | Status: AC
  Administered 2019-04-13: 13:00:00 via INTRAVENOUS
  Filled 2019-04-13: qty 250

## 2019-04-13 MED ORDER — HEPARIN SOD (PORK) LOCK FLUSH 100 UNIT/ML IV SOLN
500.0000 [IU] | Freq: Once | INTRAVENOUS | Status: AC | PRN
  Administered 2019-04-13: 500 [IU]
  Filled 2019-04-13: qty 5

## 2019-04-13 MED ORDER — SODIUM CHLORIDE 0.9 % IV SOLN
30.0000 mg/m2 | Freq: Once | INTRAVENOUS | Status: AC
  Administered 2019-04-13: 40 mg via INTRAVENOUS
  Filled 2019-04-13: qty 4

## 2019-04-13 NOTE — Patient Instructions (Signed)
[facility] Discharge Instructions for Patients Receiving Chemotherapy  Today you received the following chemotherapy agents: Taxotere, Gemzar   To help prevent nausea and vomiting after your treatment, we encourage you to take your nausea medication as directed.    If you develop nausea and vomiting that is not controlled by your nausea medication, call the clinic.   BELOW ARE SYMPTOMS THAT SHOULD BE REPORTED IMMEDIATELY:  *FEVER GREATER THAN 100.5 F  *CHILLS WITH OR WITHOUT FEVER  NAUSEA AND VOMITING THAT IS NOT CONTROLLED WITH YOUR NAUSEA MEDICATION  *UNUSUAL SHORTNESS OF BREATH  *UNUSUAL BRUISING OR BLEEDING  TENDERNESS IN MOUTH AND THROAT WITH OR WITHOUT PRESENCE OF ULCERS  *URINARY PROBLEMS  *BOWEL PROBLEMS  UNUSUAL RASH Items with * indicate a potential emergency and should be followed up as soon as possible.  Feel free to call the clinic should you have any questions or concerns. The clinic phone number is [phone].  Please show the [place] at check-in to the Emergency Department and triage nurse.  Docetaxel injection What is this medicine? DOCETAXEL (doe se TAX el) is a chemotherapy drug. It targets fast dividing cells, like cancer cells, and causes these cells to die. This medicine is used to treat many types of cancers like breast cancer, certain stomach cancers, head and neck cancer, lung cancer, and prostate cancer. This medicine may be used for other purposes; ask your health care provider or pharmacist if you have questions. COMMON BRAND NAME(S): Docefrez, Taxotere What should I tell my health care provider before I take this medicine? They need to know if you have any of these conditions:  infection (especially a virus infection such as chickenpox, cold sores, or herpes)  liver disease  low blood counts, like low white cell, platelet, or red cell counts  an unusual or allergic reaction to docetaxel, polysorbate 80, other  chemotherapy agents, other medicines, foods, dyes, or preservatives  pregnant or trying to get pregnant  breast-feeding How should I use this medicine? This drug is given as an infusion into a vein. It is administered in a hospital or clinic by a specially trained health care professional. Talk to your pediatrician regarding the use of this medicine in children. Special care may be needed. Overdosage: If you think you have taken too much of this medicine contact a poison control center or emergency room at once. NOTE: This medicine is only for you. Do not share this medicine with others. What if I miss a dose? It is important not to miss your dose. Call your doctor or health care professional if you are unable to keep an appointment. What may interact with this medicine?  aprepitant  certain antibiotics like erythromycin or clarithromycin  certain antivirals for HIV or hepatitis  certain medicines for fungal infections like fluconazole, itraconazole, ketoconazole, posaconazole, or voriconazole  cimetidine  ciprofloxacin  conivaptan  cyclosporine  dronedarone  fluvoxamine  grapefruit juice  imatinib  verapamil This list may not describe all possible interactions. Give your health care provider a list of all the medicines, herbs, non-prescription drugs, or dietary supplements you use. Also tell them if you smoke, drink alcohol, or use illegal drugs. Some items may interact with your medicine. What should I watch for while using this medicine? Your condition will be monitored carefully while you are receiving this medicine. You will need important blood work done while you are taking this medicine. Call your doctor or health care professional for advice if you get a  fever, chills or sore throat, or other symptoms of a cold or flu. Do not treat yourself. This drug decreases your body's ability to fight infections. Try to avoid being around people who are sick. Some products may  contain alcohol. Ask your health care professional if this medicine contains alcohol. Be sure to tell all health care professionals you are taking this medicine. Certain medicines, like metronidazole and disulfiram, can cause an unpleasant reaction when taken with alcohol. The reaction includes flushing, headache, nausea, vomiting, sweating, and increased thirst. The reaction can last from 30 minutes to several hours. You may get drowsy or dizzy. Do not drive, use machinery, or do anything that needs mental alertness until you know how this medicine affects you. Do not stand or sit up quickly, especially if you are an older patient. This reduces the risk of dizzy or fainting spells. Alcohol may interfere with the effect of this medicine. Talk to your health care professional about your risk of cancer. You may be more at risk for certain types of cancer if you take this medicine. Do not become pregnant while taking this medicine or for 6 months after stopping it. Women should inform their doctor if they wish to become pregnant or think they might be pregnant. There is a potential for serious side effects to an unborn child. Talk to your health care professional or pharmacist for more information. Do not breast-feed an infant while taking this medicine or for 1 to 2 weeks after stopping it. Males who get this medicine must use a condom during sex with females who can get pregnant. If you get a woman pregnant, the baby could have birth defects. The baby could die before they are born. You will need to continue wearing a condom for 3 months after stopping the medicine. Tell your health care provider right away if your partner becomes pregnant while you are taking this medicine. This may interfere with the ability to father a child. You should talk to your doctor or health care professional if you are concerned about your fertility. What side effects may I notice from receiving this medicine? Side effects that you  should report to your doctor or health care professional as soon as possible:  allergic reactions like skin rash, itching or hives, swelling of the face, lips, or tongue  blurred vision  breathing problems  changes in vision  low blood counts - This drug may decrease the number of white blood cells, red blood cells and platelets. You may be at increased risk for infections and bleeding.  nausea and vomiting  pain, redness or irritation at site where injected  pain, tingling, numbness in the hands or feet  redness, blistering, peeling, or loosening of the skin, including inside the mouth  signs of decreased platelets or bleeding - bruising, pinpoint red spots on the skin, black, tarry stools, nosebleeds  signs of decreased red blood cells - unusually weak or tired, fainting spells, lightheadedness  signs of infection - fever or chills, cough, sore throat, pain or difficulty passing urine  swelling of the ankle, feet, hands Side effects that usually do not require medical attention (report to your doctor or health care professional if they continue or are bothersome):  constipation  diarrhea  fingernail or toenail changes  hair loss  loss of appetite  mouth sores  muscle pain This list may not describe all possible side effects. Call your doctor for medical advice about side effects. You may report side effects to  FDA at 1-800-FDA-1088. Where should I keep my medicine? This drug is given in a hospital or clinic and will not be stored at home. NOTE: This sheet is a summary. It may not cover all possible information. If you have questions about this medicine, talk to your doctor, pharmacist, or health care provider.  2020 Elsevier/Gold Standard (2018-10-14 12:23:11)

## 2019-04-13 NOTE — Telephone Encounter (Signed)
Scheduled appt per 8/20 los.  Spoke with patient and she is aware of her appt dates and time.  Sent a message to get the treatment added for 9/10.

## 2019-04-14 ENCOUNTER — Other Ambulatory Visit: Payer: Self-pay | Admitting: Hematology

## 2019-04-14 ENCOUNTER — Telehealth: Payer: Self-pay | Admitting: *Deleted

## 2019-04-14 DIAGNOSIS — D696 Thrombocytopenia, unspecified: Secondary | ICD-10-CM

## 2019-04-14 LAB — CULTURE, BLOOD (ROUTINE X 2)
Culture: NO GROWTH
Culture: NO GROWTH

## 2019-04-14 LAB — CANCER ANTIGEN 19-9: CA 19-9: 64 U/mL — ABNORMAL HIGH (ref 0–35)

## 2019-04-14 NOTE — Telephone Encounter (Signed)
Called [name] for chemotherapy F/U.  Patient is doing well.  Denies n/v.  Denies any new side effects or symptoms.  Bowel and bladder functioning well.  Eating and drinking well.  Instructed to drink 64 oz minimum daily or at least the day before, of and after treatment.   Denies questions or needs at this time.  Encouraged to call [phone] Mon -Fri 8:00 am - 4:30 pm or anytime as needed for symptoms, changes or event outside office hours.

## 2019-04-14 NOTE — Telephone Encounter (Signed)
Called & spoke to pt regarding new taxotere treatment she had yesterday.  Pt reports that she felt better in regard to pain but felt fatigued today.  Appetite is low but she is eating & drinking some.  Denied diarrhea/constipation.  She states she is familiar with side effects & Morgan Dawson/Pharmacist went over meds very well.  She knows to call with questions/concerns.

## 2019-04-17 ENCOUNTER — Telehealth: Payer: Self-pay | Admitting: Nurse Practitioner

## 2019-04-17 NOTE — Telephone Encounter (Signed)
Called to inform patient that her treatment for 9/10 has been added.  Patient aware of her appt date and time.

## 2019-04-20 ENCOUNTER — Other Ambulatory Visit: Payer: 59

## 2019-04-20 ENCOUNTER — Inpatient Hospital Stay: Payer: 59

## 2019-04-20 ENCOUNTER — Encounter: Payer: Self-pay | Admitting: Nurse Practitioner

## 2019-04-20 ENCOUNTER — Inpatient Hospital Stay (HOSPITAL_BASED_OUTPATIENT_CLINIC_OR_DEPARTMENT_OTHER): Payer: 59 | Admitting: Nurse Practitioner

## 2019-04-20 ENCOUNTER — Other Ambulatory Visit: Payer: Self-pay

## 2019-04-20 VITALS — BP 105/70 | HR 91 | Temp 98.9°F | Resp 17 | Wt 103.3 lb

## 2019-04-20 VITALS — BP 116/80

## 2019-04-20 DIAGNOSIS — C787 Secondary malignant neoplasm of liver and intrahepatic bile duct: Secondary | ICD-10-CM | POA: Diagnosis not present

## 2019-04-20 DIAGNOSIS — Z95828 Presence of other vascular implants and grafts: Secondary | ICD-10-CM

## 2019-04-20 DIAGNOSIS — C259 Malignant neoplasm of pancreas, unspecified: Secondary | ICD-10-CM

## 2019-04-20 DIAGNOSIS — C251 Malignant neoplasm of body of pancreas: Secondary | ICD-10-CM

## 2019-04-20 DIAGNOSIS — Z7189 Other specified counseling: Secondary | ICD-10-CM

## 2019-04-20 DIAGNOSIS — Z5111 Encounter for antineoplastic chemotherapy: Secondary | ICD-10-CM | POA: Diagnosis not present

## 2019-04-20 LAB — CBC WITH DIFFERENTIAL/PLATELET
Abs Immature Granulocytes: 0.02 10*3/uL (ref 0.00–0.07)
Basophils Absolute: 0 10*3/uL (ref 0.0–0.1)
Basophils Relative: 1 %
Eosinophils Absolute: 0 10*3/uL (ref 0.0–0.5)
Eosinophils Relative: 0 %
HCT: 25.3 % — ABNORMAL LOW (ref 36.0–46.0)
Hemoglobin: 8.1 g/dL — ABNORMAL LOW (ref 12.0–15.0)
Immature Granulocytes: 1 %
Lymphocytes Relative: 16 %
Lymphs Abs: 0.5 10*3/uL — ABNORMAL LOW (ref 0.7–4.0)
MCH: 32.3 pg (ref 26.0–34.0)
MCHC: 32 g/dL (ref 30.0–36.0)
MCV: 100.8 fL — ABNORMAL HIGH (ref 80.0–100.0)
Monocytes Absolute: 0.4 10*3/uL (ref 0.1–1.0)
Monocytes Relative: 14 %
Neutro Abs: 2.2 10*3/uL (ref 1.7–7.7)
Neutrophils Relative %: 68 %
Platelets: 234 10*3/uL (ref 150–400)
RBC: 2.51 MIL/uL — ABNORMAL LOW (ref 3.87–5.11)
RDW: 16.5 % — ABNORMAL HIGH (ref 11.5–15.5)
WBC: 3.2 10*3/uL — ABNORMAL LOW (ref 4.0–10.5)
nRBC: 0 % (ref 0.0–0.2)

## 2019-04-20 LAB — COMPREHENSIVE METABOLIC PANEL
ALT: 40 U/L (ref 0–44)
AST: 45 U/L — ABNORMAL HIGH (ref 15–41)
Albumin: 3.1 g/dL — ABNORMAL LOW (ref 3.5–5.0)
Alkaline Phosphatase: 382 U/L — ABNORMAL HIGH (ref 38–126)
Anion gap: 7 (ref 5–15)
BUN: 23 mg/dL — ABNORMAL HIGH (ref 6–20)
CO2: 29 mmol/L (ref 22–32)
Calcium: 9.5 mg/dL (ref 8.9–10.3)
Chloride: 101 mmol/L (ref 98–111)
Creatinine, Ser: 1.25 mg/dL — ABNORMAL HIGH (ref 0.44–1.00)
GFR calc Af Amer: 56 mL/min — ABNORMAL LOW (ref >60)
GFR calc non Af Amer: 48 mL/min — ABNORMAL LOW (ref >60)
Glucose, Bld: 153 mg/dL — ABNORMAL HIGH (ref 70–99)
Potassium: 3.5 mmol/L (ref 3.5–5.1)
Sodium: 137 mmol/L (ref 135–145)
Total Bilirubin: 0.6 mg/dL (ref 0.3–1.2)
Total Protein: 7.5 g/dL (ref 6.5–8.1)

## 2019-04-20 MED ORDER — SODIUM CHLORIDE 0.9 % IV SOLN
750.0000 mg/m2 | Freq: Once | INTRAVENOUS | Status: AC
  Administered 2019-04-20: 1102 mg via INTRAVENOUS
  Filled 2019-04-20: qty 28.98

## 2019-04-20 MED ORDER — SODIUM CHLORIDE 0.9 % IV SOLN
Freq: Once | INTRAVENOUS | Status: AC
  Administered 2019-04-20: 12:00:00 via INTRAVENOUS
  Filled 2019-04-20: qty 250

## 2019-04-20 MED ORDER — DEXAMETHASONE SODIUM PHOSPHATE 10 MG/ML IJ SOLN
10.0000 mg | Freq: Once | INTRAMUSCULAR | Status: AC
  Administered 2019-04-20: 10 mg via INTRAVENOUS

## 2019-04-20 MED ORDER — SODIUM CHLORIDE 0.9% FLUSH
10.0000 mL | INTRAVENOUS | Status: DC | PRN
  Administered 2019-04-20: 15:00:00 10 mL
  Filled 2019-04-20: qty 10

## 2019-04-20 MED ORDER — HEPARIN SOD (PORK) LOCK FLUSH 100 UNIT/ML IV SOLN
500.0000 [IU] | Freq: Once | INTRAVENOUS | Status: AC | PRN
  Administered 2019-04-20: 500 [IU]
  Filled 2019-04-20: qty 5

## 2019-04-20 MED ORDER — SODIUM CHLORIDE 0.9 % IV SOLN
30.0000 mg/m2 | Freq: Once | INTRAVENOUS | Status: AC
  Administered 2019-04-20: 14:00:00 40 mg via INTRAVENOUS
  Filled 2019-04-20: qty 4

## 2019-04-20 MED ORDER — SODIUM CHLORIDE 0.9% FLUSH
10.0000 mL | INTRAVENOUS | Status: DC | PRN
  Administered 2019-04-20: 10:00:00 10 mL
  Filled 2019-04-20: qty 10

## 2019-04-20 MED ORDER — DEXAMETHASONE SODIUM PHOSPHATE 10 MG/ML IJ SOLN
INTRAMUSCULAR | Status: AC
  Filled 2019-04-20: qty 1

## 2019-04-20 NOTE — Patient Instructions (Signed)
Rehydration, Adult Rehydration is the replacement of body fluids and salts and minerals (electrolytes) that are lost during dehydration. Dehydration is when there is not enough fluid or water in the body. This happens when you lose more fluids than you take in. Common causes of dehydration include:  Vomiting.  Diarrhea.  Excessive sweating, such as from heat exposure or exercise.  Taking medicines that cause the body to lose excess fluid (diuretics).  Impaired kidney function.  Not drinking enough fluid.  Certain illnesses or infections.  Certain poorly controlled long-term (chronic) illnesses, such as diabetes, heart disease, and kidney disease.  Symptoms of mild dehydration may include thirst, dry lips and mouth, dry skin, and dizziness. Symptoms of severe dehydration may include increased heart rate, confusion, fainting, and not urinating. You can rehydrate by drinking certain fluids or getting fluids through an IV tube, as told by your health care provider. What are the risks? Generally, rehydration is safe. However, one problem that can happen is taking in too much fluid (overhydration). This is rare. If overhydration happens, it can cause an electrolyte imbalance, kidney failure, or a decrease in salt (sodium) levels in the body. How to rehydrate Follow instructions from your health care provider for rehydration. The kind of fluid you should drink and the amount you should drink depend on your condition.  If directed by your health care provider, drink an oral rehydration solution (ORS). This is a drink designed to treat dehydration that is found in pharmacies and retail stores. ? Make an ORS by following instructions on the package. ? Start by drinking small amounts, about  cup (120 mL) every 5-10 minutes. ? Slowly increase how much you drink until you have taken the amount recommended by your health care provider.  Drink enough clear fluids to keep your urine clear or pale  yellow. If you were instructed to drink an ORS, finish the ORS first, then start slowly drinking other clear fluids. Drink fluids such as: ? Water. Do not drink only water. Doing that can lead to having too little sodium in your body (hyponatremia). ? Ice chips. ? Fruit juice that you have added water to (diluted juice). ? Low-calorie sports drinks.  If you are severely dehydrated, your health care provider may recommend that you receive fluids through an IV tube in the hospital.  Do not take sodium tablets. Doing that can lead to the condition of having too much sodium in your body (hypernatremia). Eating while you rehydrate Follow instructions from your health care provider about what to eat while you rehydrate. Your health care provider may recommend that you slowly begin eating regular foods in small amounts.  Eat foods that contain a healthy balance of electrolytes, such as bananas, oranges, potatoes, tomatoes, and spinach.  Avoid foods that are greasy or contain a lot of fat or sugar.  In some cases, you may get nutrition through a feeding tube that is passed through your nose and into your stomach (nasogastric tube, or NG tube). This may be done if you have uncontrolled vomiting or diarrhea. Beverages to avoid Certain beverages may make dehydration worse. While you rehydrate, avoid:  Alcohol.  Caffeine.  Drinks that contain a lot of sugar. These include: ? High-calorie sports drinks. ? Fruit juice that is not diluted. ? Soda.  Check nutrition labels to see how much sugar or caffeine a beverage contains. Signs of dehydration recovery You may be recovering from dehydration if:  You are urinating more often than before you started   rehydrating.  Your urine is clear or pale yellow.  Your energy level improves.  You vomit less frequently.  You have diarrhea less frequently.  Your appetite improves or returns to normal.  You feel less dizzy or less light-headed.  Your  skin tone and color start to look more normal. Contact a health care provider if:  You continue to have symptoms of mild dehydration, such as: ? Thirst. ? Dry lips. ? Slightly dry mouth. ? Dry, warm skin. ? Dizziness.  You continue to vomit or have diarrhea. Get help right away if:  You have symptoms of dehydration that get worse.  You feel: ? Confused. ? Weak. ? Like you are going to faint.  You have not urinated in 6-8 hours.  You have very dark urine.  You have trouble breathing.  Your heart rate while sitting still is over 100 beats a minute.  You cannot drink fluids without vomiting.  You have vomiting or diarrhea that: ? Gets worse. ? Does not go away.  You have a fever. This information is not intended to replace advice given to you by your health care provider. Make sure you discuss any questions you have with your health care provider. Document Released: 11/02/2011 Document Revised: 07/23/2017 Document Reviewed: 10/04/2015 Elsevier Patient Education  2020 [facility] Discharge Instructions for Patients Receiving Chemotherapy  Today you received the following chemotherapy agents: Gemcitabine (Gemzar) and Docetaxel (Taxotere)  To help prevent nausea and vomiting after your treatment, we encourage you to take your nausea medication as directed.   If you develop nausea and vomiting that is not controlled by your nausea medication, call the clinic.   BELOW ARE SYMPTOMS THAT SHOULD BE REPORTED IMMEDIATELY:  *FEVER GREATER THAN 100.5 F  *CHILLS WITH OR WITHOUT FEVER  NAUSEA AND VOMITING THAT IS NOT CONTROLLED WITH YOUR NAUSEA MEDICATION  *UNUSUAL SHORTNESS OF BREATH  *UNUSUAL BRUISING OR BLEEDING  TENDERNESS IN MOUTH AND THROAT WITH OR WITHOUT PRESENCE OF ULCERS  *URINARY PROBLEMS  *BOWEL PROBLEMS  UNUSUAL RASH Items with * indicate a potential emergency and should be followed up as soon as possible.  Feel free to  call the clinic should you have any questions or concerns. The clinic phone number is [phone].  Please show the [facility] at check-in to the Emergency Department and triage nurse.  Coronavirus (COVID-19) Are you at risk?  Are you at risk for the Coronavirus (COVID-19)?  To be considered HIGH RISK for Coronavirus (COVID-19), you have to meet the following criteria:  . Traveled to [place], [place], [place], [place] or [place]; or in the [place] to [place], [place], [place], or [place]; and have fever, cough, and shortness of breath within the last 2 weeks of travel OR . Been in close contact with a person diagnosed with COVID-19 within the last 2 weeks and have fever, cough, and shortness of breath . IF YOU DO NOT MEET THESE CRITERIA, YOU ARE CONSIDERED LOW RISK FOR COVID-19.  What to do if you are HIGH RISK for COVID-19?  Marland Kitchen If you are having a medical emergency, call 911. . Seek medical care right away. Before you go to a doctor's office, urgent care or emergency department, call ahead and tell them about your recent travel, contact with someone diagnosed with COVID-19, and your symptoms. You should receive instructions from your physician's office regarding next steps of care.  . When you arrive at healthcare provider, tell the  healthcare staff immediately you have returned from visiting [place], [place], [place], [place] or [place]; or traveled in the [place] to [place], [place], [place], or [place]; in the last two weeks or you have been in close contact with a person diagnosed with COVID-19 in the last 2 weeks.   . Tell the health care staff about your symptoms: fever, cough and shortness of breath. . After you have been seen by a medical provider, you will be either: o Tested for (COVID-19) and discharged home on quarantine except to seek medical care if symptoms worsen, and asked to  - Stay home and avoid contact with others until you get your results  (4-5 days)  - Avoid travel on public transportation if possible (such as bus, train, or airplane) or o Sent to the Emergency Department by EMS for evaluation, COVID-19 testing, and possible admission depending on your condition and test results.  What to do if you are LOW RISK for COVID-19?  Reduce your risk of any infection by using the same precautions used for avoiding the common cold or flu:  Marland Kitchen Wash your hands often with soap and warm water for at least 20 seconds.  If soap and water are not readily available, use an alcohol-based hand sanitizer with at least 60% alcohol.  . If coughing or sneezing, cover your mouth and nose by coughing or sneezing into the elbow areas of your shirt or coat, into a tissue or into your sleeve (not your hands). . Avoid shaking hands with others and consider head nods or verbal greetings only. . Avoid touching your eyes, nose, or mouth with unwashed hands.  . Avoid close contact with people who are sick. . Avoid places or events with large numbers of people in one location, like concerts or sporting events. . Carefully consider travel plans you have or are making. . If you are planning any travel outside or inside the [place], visit the CDC's Travelers' Health webpage for the latest health notices. . If you have some symptoms but not all symptoms, continue to monitor at home and seek medical attention if your symptoms worsen. . If you are having a medical emergency, call 911.   [facility] / e-Visit: [email or web address]         [facility]: [facility]: [id number]                   [facility]: [phone]

## 2019-04-20 NOTE — Patient Instructions (Signed)

## 2019-04-20 NOTE — Progress Notes (Signed)
[place]   [phone] [phone]   Clinic Follow up Note   Patient Care Team: [name], MD as PCP - General (Family Medicine) [name], MD as Consulting Physician (Otolaryngology) [name], MD as Consulting Physician (Oncology) 04/20/2019  CHIEF COMPLAINT: Follow-up pancreas cancer  SUMMARY OF ONCOLOGIC HISTORY: Oncology History Overview Note  Cancer Staging Pancreatic cancer [facility]) Staging form: Exocrine Pancreas, AJCC 8th Edition - Clinical stage from 08/06/2017: Stage IV (cTX, cN0, pM1) - Signed by [name], MD on 08/12/2017     Pancreatic cancer ([place])  07/23/2017 Imaging   US abdomen limited RUQ 07/23/17 IMPRESSION: 1. Cholelithiasis.  No secondary signs of acute cholecystitis. 2. Multiple solid liver masses measuring up to 6.5 cm in the left lobe of the liver, evaluation for metastatic disease is recommended. These results will be called to the ordering clinician or representative by the Radiologist Assistant, and communication documented in the PACS or zVision Dashboard.   07/23/2017 Imaging   CT Abdomen W Contrast 07/23/17 IMPRESSION: 1. Widespread metastatic disease throughout the liver. No clear primary malignancy identified in the abdomen. The pelvis was not imaged. Tissue sampling recommended. 2. Probable adenopathy superior to the pancreatic tail. No evidence of pancreatic mass. 3. Suspected incidental hemangioma inferiorly in the right hepatic lobe. 4. Nonspecific nodularity in the breasts. The patient has undergone recent (03/25/2017 and 04/01/2017) mammography and ultrasound.   07/29/2017 Initial Diagnosis   Metastasis to liver of unknown origin ([place])   07/29/2017 PET scan   PET 07/29/17  IMPRESSION: 1. Numerous bulky liver masses are hypermetabolic compatible with malignancy. 2. Accentuated activity within or along the pancreatic tail, likely represent a primary pancreatic tumor. Consider pancreatic  protocol MRI to further work this up. 3. The peripancreatic lymph node shown above the pancreatic tail is mildly hypermetabolic favoring malignancy. 4.  Prominent stool throughout the colon favors constipation. 5. 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   08/05/2017 Pathology Results   Liver Biopsy  Diagnosis 08/05/17 Liver, needle/core biopsy - CARCINOMA. - SEE COMMENT. Microscopic Comment The malignant cells are positive for cytokeratin 7. They are negative for arginase, CDX2, cytokeratin 20, estrogen receptor, GATA-3, GCDFP, Glypican 3, Hep Par 1, Napsin A, and TTF-1. This immunohistochemical is nonspecific. Possibly primary sources include pancreatobiliary and upper gastrointestinal. Radiologic correlation is necessary. Of note, organ specific markers (GATA-3, GCDFP-breast, TTF-1, Napsin A-lung, and CDX2-colon) are negative. (JBK:ecj 08/09/2017)   08/21/2017 - 02/10/2018 Chemotherapy   FOLFIRINOX every 2 weeks starting 08/21/17. Dose reduced due to neuropahty and cytopenia    10/14/2017 Imaging   CT CAP WO Contrast 10/14/17 IMPRESSION: Evidence of known numerous liver metastases without significant interval change. No other evidence of metastatic disease within the chest, abdomen or pelvis. Cholelithiasis. Tiny pericardial effusion.   12/02/2017 Genetic Testing   Negative for pathogenic mutation.  The genes analyzed were the 83 genes on [facility]'s Multi-Cancer panel (ALK, APC, ATM, AXIN2, BAP1, BARD1, BLM, BMPR1A, BRCA1, BRCA2, BRIP1, CASR, CDC73, CDH1, CDK4, CDKN1B, CDKN1C, CDKN2A, CEBPA, CHEK2, CTNNA1, DICER1, DIS3L2, EGFR, EPCAM, FH, FLCN, GATA2, GPC3, GREM1, HOXB13, HRAS, KIT, MAX, MEN1, MET, MITF, MLH1, MSH2, MSH3, MSH6, MUTYH, NBN, NF1, NF2, NTHL1, PALB2, PDGFRA, PHOX2B, PMS2, POLD1, POLE, POT1, PRKAR1A, PTCH1, PTEN, RAD50, RAD51C, RAD51D, RB1, RECQL4, RET, RUNX1, SDHA, SDHAF2, SDHB, SDHC, SDHD, SMAD4, SMARCA4, SMARCB1, SMARCE1, STK11, SUFU, TERC, TERT, TMEM127, TP53, TSC1, TSC2,  VHL, WRN, WT1).   12/17/2017 PET scan   IMPRESSION: 1. Although the liver metastases are still visible on the CT  images, they are significantly smaller and retain no abnormal metabolic activity, consistent with response to therapy. 2. No abnormal activity within the pancreas. 3. No disease progression identified. 4. Cholelithiasis.   02/21/2018 PET scan   02/21/2018 PET Scan  IMPRESSION: 1. There are 2 new small nodules identified within the right lung which measure up to 5 mm. These are too small to reliably characterize by PET-CT, but warrant close interval follow-up. 2. Again noted are multifocal liver metastasis. The target lesion within the left lobe is slightly decreased in size when compared with the previous exam. Similar to previous exam there is no abnormal hypermetabolism above background liver activity identified within the liver lesions. 3. Gallstones.   02/23/2018 - 09/04/2018 Chemotherapy   5-FU pump infusion and liposomal irinotecan, every 2 weeks.  First cycle dose reduced due to cytopenia in the travel. Stopped on 08/25/2018 due to disease progression.    05/17/2018 Imaging   05/17/2018 PET Scan IMPRESSION: 1. Mixed response. The 2 small right lung nodules have decreased in size in the interval. Single focus of increased uptake within the liver is new from 02/21/2018 and concerning for recurrent metabolically active liver metastasis. 2. Splenomegaly.  New from previous exam. 3. Diffusely increased bone marrow activity, likely reflecting treatment related changes.   09/01/2018 PET scan   PET 09/01/18 IMPRESSION: 1. Unfortunately there recurrence of hepatic metastasis with multiple new hypermetabolic lesions in LEFT and RIGHT hepatic lobe. 2. New extra hepatic site of malignancy which appears associated the mid pancreas. Difficult to define lesion on noncontrast exam. 3. Diffuse marrow activity is favored benign. 4. No evidence of pulmonary metastasis.   12/14/2018  Imaging   CT CAP  IMPRESSION: CT demonstrates acute cholecystitis, with choledocholithiasis of the cystic duct.  These results were discussed by telephone at the time of interpretation on 12/14/2018 at 5:05 pm with Dr. Burr Medico.  Redemonstration of known liver metastases, with positive response to therapy, interval reduction in size of all lesions, with multiple demonstrating significant internal necrosis.  No acute finding of the chest.  Ancillary findings as above.   03/21/2019 Imaging   CT CAP W Contrast IMPRESSION: 1. Numerous rim enhancing liver metastases show overall trend towards mild progression although 1 of the index lesions is stable in the interval. 2. Interval development of portocaval lymphadenopathy concerning for disease progression. 3. Slight progression of main duct dilatation in the pancreas with abrupt cut off in the region of the body. 4. Interval development of a 9 mm subpleural nodule at the right base along the posterior hemidiaphragm. Close attention on follow-up recommended as metastatic disease not excluded. 5. New splenomegaly. 6. Cholelithiasis with diffuse gallbladder wall thickening. Gallbladder wall thickening is decreased in the interval. Stones and sludge noted in the gallbladder lumen.    Chemotherapy   Third-line GTX with Xeloda BID day 1-14, off for 1 week with Docetaxel and Gemcitbine on day 4 and day 11 every 3 weeks starting oral chemo on 04/10/19.    04/13/2019 -  Chemotherapy   The patient had DOCEtaxel (TAXOTERE) 40 mg in sodium chloride 0.9 % 100 mL chemo infusion, 30 mg/m2 = 40 mg (100 % of original dose 30 mg/m2), Intravenous,  Once, 1 of 4 cycles Dose modification: 30 mg/m2 (original dose 30 mg/m2, Cycle 1, Reason: Provider Judgment) Administration: 40 mg (04/13/2019), 40 mg (04/20/2019) gemcitabine (GEMZAR) 1,102 mg in sodium chloride 0.9 % 250 mL chemo infusion, 750 mg/m2 = 1,102 mg (100 % of original dose 750 mg/m2), Intravenous,  Once, 1 of 4 cycles Dose modification: 750 mg/m2 (original dose 750 mg/m2, Cycle 1, Reason: Provider Judgment) Administration: 1,102 mg (04/13/2019), 1,102 mg (04/20/2019)  for chemotherapy treatment.    Pancreatic cancer metastasized to liver ([place])  08/11/2017 Initial Diagnosis   Pancreatic cancer metastasized to liver ([place])   09/12/2018 - 03/27/2019 Chemotherapy   second-line Gemcitabine and Abraxane for 2 weeks on, 1 week off starting 09/12/2018. Due to neuropathy, I will start her with dose reduced Abraxane. Stopped after 03/27/19 due to disease progression.     Chemotherapy   Third-line GTX with Xeloda BID day 1-14, off for 1 week with Docetaxel and Gemcitbine on day 4 and day 11 every 3 weeks starting oral chemo on 04/10/19.    04/13/2019 -  Chemotherapy   The patient had DOCEtaxel (TAXOTERE) 40 mg in sodium chloride 0.9 % 100 mL chemo infusion, 30 mg/m2 = 40 mg (100 % of original dose 30 mg/m2), Intravenous,  Once, 1 of 4 cycles Dose modification: 30 mg/m2 (original dose 30 mg/m2, Cycle 1, Reason: Provider Judgment) Administration: 40 mg (04/13/2019), 40 mg (04/20/2019) gemcitabine (GEMZAR) 1,102 mg in sodium chloride 0.9 % 250 mL chemo infusion, 750 mg/m2 = 1,102 mg (100 % of original dose 750 mg/m2), Intravenous,  Once, 1 of 4 cycles Dose modification: 750 mg/m2 (original dose 750 mg/m2, Cycle 1, Reason: Provider Judgment) Administration: 1,102 mg (04/13/2019), 1,102 mg (04/20/2019)  for chemotherapy treatment.      CURRENT THERAPY: Third-line GTX with Xeloda BID day 1-14, off for 1 week with Docetaxel and Gemcitbine on day 4 and day 11 every 3 weeks starting oral chemo on 04/10/19.  INTERVAL HISTORY: He returns for follow-up and cycle 1 day 11 gemcitabine and Taxotere as scheduled. She continues Xeloda. She was very fatigued for few days after chemo that gradually improved but is again worse today because she did not get much sleep. Last night she woke up to urinate then felt clammy and  vomited what she thinks is food. She denies nausea or vomiting otherwise. Has had relatively good appetite. Has small BM daily. Denies bleeding. Denies mucositis. Denies hand/foot redness or pain. She completed levaquin. Has not had fever all week. Denies abdominal pain lately. Has felt occasionally dizzy on standing. No fall. Neuropathy is slightly worse in fingertips, can button clothes without difficulty. Most difficulty is with writing.    MEDICAL HISTORY:  Past Medical History:  Diagnosis Date   Diarrhea of presumed infectious origin 01/12/2018   Gastroenteritis 01/12/2018   Genetic testing 12/02/2017   Multi-Cancer panel (83 genes) @ [facility] - No pathogenic mutations detected   Meniere disease 2016   Pancreatitis     SURGICAL HISTORY: Past Surgical History:  Procedure Laterality Date   CERVICAL ABLATION     ENDOMETRIAL ABLATION  2011   IR CHOLANGIOGRAM EXISTING TUBE  01/09/2019   IR FLUORO GUIDE PORT INSERTION RIGHT  08/12/2017   IR PERC CHOLECYSTOSTOMY  12/15/2018   IR US GUIDE VASC ACCESS RIGHT  08/12/2017   MANDIBLE SURGERY  1999    I have reviewed the social history and family history with the patient and they are unchanged from previous note.  ALLERGIES:  has No Known Allergies.  MEDICATIONS:  Current Outpatient Medications  Medication Sig Dispense Refill   b complex vitamins tablet Take 1 tablet by mouth daily.     capecitabine (XELODA) 500 MG tablet Take 2 tablets (1,000 mg total) by mouth 2 (two) times daily after a meal. Take on days 1-14  of each 21 day cycle 56 tablet 2   cyclobenzaprine (FLEXERIL) 5 MG tablet Take 1 tablet (5 mg total) by mouth 3 (three) times daily as needed for muscle spasms. 30 tablet 0   gabapentin (NEURONTIN) 100 MG capsule TAKE ONE CAPSULE BY MOUTH THREE TIMES DAILY (Patient taking differently: Take 100 mg by mouth 2 (two) times daily. ) 270 capsule 1   loperamide (IMODIUM) 2 MG capsule Take 1 capsule (2 mg total) by mouth as  needed for diarrhea or loose stools. 30 capsule 0   loratadine (CLARITIN) 10 MG tablet Take 10 mg by mouth daily as needed for allergies.     magic mouthwash w/lidocaine SOLN Take 10 mLs by mouth 3 (three) times daily as needed for mouth pain. Swish and spit 10 ML by mouth 3 times daily as needed for mouth pain. 240 mL 0   meclizine (ANTIVERT) 25 MG tablet Take 25 mg by mouth 3 (three) times daily as needed for dizziness.     ondansetron (ZOFRAN-ODT) 8 MG disintegrating tablet TAKE 1 TABLET BY MOUTH EVERY 8 HOURS AS NEEDED FOR NAUSE OR VOMITING (Patient taking differently: Take 8 mg by mouth every 8 (eight) hours as needed for nausea or vomiting. ) 40 tablet 0   potassium chloride (K-DUR) 10 MEQ tablet Take 1 tablet (10 mEq total) by mouth 4 (four) times daily. (Patient taking differently: Take 30 mEq by mouth daily. ) 360 tablet 2   prochlorperazine (COMPAZINE) 10 MG tablet Take 1 tablet (10 mg total) by mouth every 6 (six) hours as needed for nausea or vomiting. 40 tablet 2   PROMACTA 25 MG tablet TAKE 1 TABLET BY MOUTH ONCE DAILY ON AN EMPTY STOMACH  AT LEAST 1 HOUR BEFORE OR 2 HOURS AFTER A MEAL 30 tablet 0   Rivaroxaban (XARELTO) 15 MG TABS tablet Take 1 tablet (15 mg total) by mouth daily. 90 tablet 1   traMADol (ULTRAM) 50 MG tablet Take 1 tablet (50 mg total) by mouth every 6 (six) hours as needed for moderate pain or severe pain. 30 tablet 0   No current facility-administered medications for this visit.    Facility-Administered Medications Ordered in Other Visits  Medication Dose Route Frequency Provider Last Rate Last Dose   sodium chloride flush (NS) 0.9 % injection 10 mL  10 mL Intracatheter Once [name], MD       sodium chloride flush (NS) 0.9 % injection 10 mL  10 mL Intracatheter PRN [name], MD   10 mL at 04/20/19 1529    PHYSICAL EXAMINATION: ECOG PERFORMANCE STATUS: 1 - Symptomatic but completely ambulatory  Vitals:   04/20/19 1017  BP: 105/70  Pulse: 91    Resp: 17  Temp: 98.9 F (37.2 C)  SpO2: 100%   Filed Weights   04/20/19 1017  Weight: 103 lb 4.8 oz (46.9 kg)    GENERAL:alert, no distress and comfortable SKIN: no rash. Palms without erythema  EYES:  sclera clear LUNGS: clear to auscultation with normal breathing effort HEART: regular rate & rhythm, no lower extremity edema ABDOMEN: abdomen soft, non-tender and normal bowel sounds Musculoskeletal:no cyanosis of digits  NEURO: alert & oriented x 3 with fluent speech, normal gait  PAC without erythema    LABORATORY DATA:  I have reviewed the data as listed CBC Latest Ref Rng & Units 04/20/2019 04/13/2019 04/09/2019  WBC 4.0 - 10.5 K/uL 3.2(L) 8.9 13.7(H)  Hemoglobin 12.0 - 15.0 g/dL 8.1(L) 8.4(L) 8.5(L)  Hematocrit 36.0 - 46.0 %  25.3(L) 26.5(L) 26.1(L)  Platelets 150 - 400 K/uL 234 599(H) 360     CMP Latest Ref Rng & Units 04/20/2019 04/13/2019 04/09/2019  Glucose 70 - 99 mg/dL 153(H) 134(H) 156(H)  BUN 6 - 20 mg/dL 23(H) 19 24(H)  Creatinine 0.44 - 1.00 mg/dL 1.25(H) 1.15(H) 1.23(H)  Sodium 135 - 145 mmol/L 137 136 134(L)  Potassium 3.5 - 5.1 mmol/L 3.5 3.8 3.4(L)  Chloride 98 - 111 mmol/L 101 100 97(L)  CO2 22 - 32 mmol/L 29 24 25   Calcium 8.9 - 10.3 mg/dL 9.5 9.6 9.1  Total Protein 6.5 - 8.1 g/dL 7.5 7.8 7.7  Total Bilirubin 0.3 - 1.2 mg/dL 0.6 0.7 1.2  Alkaline Phos 38 - 126 U/L 382(H) 480(H) 362(H)  AST 15 - 41 U/L 45(H) 62(H) 51(H)  ALT 0 - 44 U/L 40 54(H) 49(H)      RADIOGRAPHIC STUDIES: I have personally reviewed the radiological images as listed and agreed with the findings in the report. No results found.   ASSESSMENT & PLAN: Morgan Dawson a 58 y.o.femalewith   1. 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 She was treated withfirst linechemoFOLFIRINOX with dose reduction. Due to neuropathyand cytopenia, chemo changed tosecond line 5-FU pump infusion and liposomal irinotecan every  2 weekswith dose reduction -Unfortunately she had disease progression withworseningliver mets.  -She started second-line Gemcitabine and Abraxane2 weeks on/1 week offon 09/12/18. She tolerateswell with no significant side effects except hair lossand spiked fever afterchemo -she developedacute cholecystitisand was hospitalized, s/p perc chole drain; she recovered well and resumed chemotherapy. After cycle 6 day 1 (01/02/19) she developed recurrent fever spikes and pancytopenia. Treatment was held for possible recurrent infection on 5/18. -Drain exchange was attempted in IR on 5/18 but drain fell out. Her case was discussed with IR and surgery. Surgerynot recommended due to concern for disease progression while off chemo -she is at high risk for recurrent infectiononchemotherapy -s/p cycle 10 day 1 on 03/27/19, ultimately discontinued due to disease progression  -she started third line GTX on 04/10/19; s/p cycle 1 day 4  2. Intermittent fever spikes, abdominal pain -she presented to ED for this on 8/16. CT showed stable disease. Work up essentially negative -fever spikes could be related to tumor, chemo, or infection although no evidence of that on CT -continue tramadol PRN for pain   3. Weight lossandmalnutrition -Due to chemo and underlying metastatic cancer. -she uses supplements  4. Transaminitis -Due to underlying chemo and malignancy  5. Pancytopenia, secondary to chemo -resolved   6. Hypokalemia, CKD Stage III - on KCL supplements  7.Peripheral neuropathy, grade 2 -Currently on Neurontin 200 mg at night and B complexes. -stable overall, mostly in her feet  8.Acute cholecystitis, status post cholecystectomy tube placement 4/23per IR - surgery was not recommended due to high risk of complication, and concerns for cancer progression when holding chemotherapy for surgery -Drainwas placed during hospitalization, withslightlybloody drainagedaily -Her CT  CAP from 12/14/18 -numerus gallstones. -Drain fell out on 5/18;imaging in 12/2018 consistent with cholecystitis with cystic duct obstruction. No leak on HIDA. Holding intervention given she is asymptomatic and invasive procedure would delay chemo. -[place] on 6/5 was done due to recurrent fever after resuming chemo; no indication for gallbladder drainage at this time -given prophylactic 5-day course levaquin on 04/13/19 due to fever spikes during chemo   9. LE DVT -Diagnosed on December 15, 2018, when she was hospitalized for cholecystitis -Due to her bloody biliary  drainage, and moderate thrombocytopenia from chemotherapy, she is on low-dosexarelto7m daily (no loading dose)  10.Goal of care discussion -treatment goal is palliative  Disposition:  Ms. HAvinoappears improved. She completed cycle 1 day 4 gemcitabine and taxotere and continues Xeloda 1000 mg BID. She tolerates treatment well overall. No diarrhea, mucositis, hand-foot syndrome. She has not had recent abdominal pain or fever. Labs are stable overall. She will proceed with cycle 1 day 11 gemcitabine and taxotere today. She will complete 3 more days of Xeloda to complete 14 days on. She will be off treatment next week. She will return in 2 weeks for cycle 2 day 4 infusions. She will do cryotherapy during chemo today for neuropathy.   She had mild orthostatic hypotension today with dizziness. She will get 500 cc NS with treatment. I encouraged her to drink more.   She knows to call clinic or go to ED with worsening symptoms such as fever, abdominal pain, or vomiting.   All questions were answered. The patient knows to call the clinic with any problems, questions or concerns. No barriers to learning was detected. I spent 20 minutes counseling the patient face to face. The total time spent in the appointment was 25 minutes and more than 50% was on counseling and review of test results     LAlla Feeling NP 04/20/19

## 2019-04-21 ENCOUNTER — Telehealth: Payer: Self-pay | Admitting: Nurse Practitioner

## 2019-04-21 NOTE — Telephone Encounter (Signed)
Scheduled appt per 8/27 los

## 2019-05-04 ENCOUNTER — Encounter: Payer: Self-pay | Admitting: Nurse Practitioner

## 2019-05-04 ENCOUNTER — Other Ambulatory Visit: Payer: Self-pay

## 2019-05-04 ENCOUNTER — Inpatient Hospital Stay: Payer: 59

## 2019-05-04 ENCOUNTER — Inpatient Hospital Stay: Payer: 59 | Attending: Hematology

## 2019-05-04 ENCOUNTER — Inpatient Hospital Stay (HOSPITAL_BASED_OUTPATIENT_CLINIC_OR_DEPARTMENT_OTHER): Payer: 59 | Admitting: Nurse Practitioner

## 2019-05-04 VITALS — BP 103/72 | HR 76 | Temp 98.7°F | Resp 16 | Ht 65.0 in | Wt 103.5 lb

## 2019-05-04 DIAGNOSIS — Z5111 Encounter for antineoplastic chemotherapy: Secondary | ICD-10-CM | POA: Diagnosis not present

## 2019-05-04 DIAGNOSIS — C259 Malignant neoplasm of pancreas, unspecified: Secondary | ICD-10-CM

## 2019-05-04 DIAGNOSIS — Z7189 Other specified counseling: Secondary | ICD-10-CM

## 2019-05-04 DIAGNOSIS — C787 Secondary malignant neoplasm of liver and intrahepatic bile duct: Secondary | ICD-10-CM

## 2019-05-04 DIAGNOSIS — Z95828 Presence of other vascular implants and grafts: Secondary | ICD-10-CM

## 2019-05-04 DIAGNOSIS — Z5189 Encounter for other specified aftercare: Secondary | ICD-10-CM | POA: Insufficient documentation

## 2019-05-04 DIAGNOSIS — C251 Malignant neoplasm of body of pancreas: Secondary | ICD-10-CM

## 2019-05-04 LAB — CBC WITH DIFFERENTIAL/PLATELET
Abs Immature Granulocytes: 0.01 10*3/uL (ref 0.00–0.07)
Basophils Absolute: 0 10*3/uL (ref 0.0–0.1)
Basophils Relative: 1 %
Eosinophils Absolute: 0.1 10*3/uL (ref 0.0–0.5)
Eosinophils Relative: 4 %
HCT: 27.3 % — ABNORMAL LOW (ref 36.0–46.0)
Hemoglobin: 8.5 g/dL — ABNORMAL LOW (ref 12.0–15.0)
Immature Granulocytes: 0 %
Lymphocytes Relative: 11 %
Lymphs Abs: 0.3 10*3/uL — ABNORMAL LOW (ref 0.7–4.0)
MCH: 33.5 pg (ref 26.0–34.0)
MCHC: 31.1 g/dL (ref 30.0–36.0)
MCV: 107.5 fL — ABNORMAL HIGH (ref 80.0–100.0)
Monocytes Absolute: 0.4 10*3/uL (ref 0.1–1.0)
Monocytes Relative: 12 %
Neutro Abs: 2.2 10*3/uL (ref 1.7–7.7)
Neutrophils Relative %: 72 %
Platelets: 187 10*3/uL (ref 150–400)
RBC: 2.54 MIL/uL — ABNORMAL LOW (ref 3.87–5.11)
RDW: 21.6 % — ABNORMAL HIGH (ref 11.5–15.5)
WBC: 3.1 10*3/uL — ABNORMAL LOW (ref 4.0–10.5)
nRBC: 0 % (ref 0.0–0.2)

## 2019-05-04 LAB — COMPREHENSIVE METABOLIC PANEL
ALT: 30 U/L (ref 0–44)
AST: 46 U/L — ABNORMAL HIGH (ref 15–41)
Albumin: 3.7 g/dL (ref 3.5–5.0)
Alkaline Phosphatase: 213 U/L — ABNORMAL HIGH (ref 38–126)
Anion gap: 7 (ref 5–15)
BUN: 17 mg/dL (ref 6–20)
CO2: 29 mmol/L (ref 22–32)
Calcium: 9.3 mg/dL (ref 8.9–10.3)
Chloride: 105 mmol/L (ref 98–111)
Creatinine, Ser: 1.14 mg/dL — ABNORMAL HIGH (ref 0.44–1.00)
GFR calc Af Amer: >60 mL/min (ref >60)
GFR calc non Af Amer: 54 mL/min — ABNORMAL LOW (ref >60)
Glucose, Bld: 114 mg/dL — ABNORMAL HIGH (ref 70–99)
Potassium: 3.6 mmol/L (ref 3.5–5.1)
Sodium: 141 mmol/L (ref 135–145)
Total Bilirubin: 0.6 mg/dL (ref 0.3–1.2)
Total Protein: 7.2 g/dL (ref 6.5–8.1)

## 2019-05-04 MED ORDER — DEXAMETHASONE SODIUM PHOSPHATE 10 MG/ML IJ SOLN
10.0000 mg | Freq: Once | INTRAMUSCULAR | Status: AC
  Administered 2019-05-04: 10 mg via INTRAVENOUS

## 2019-05-04 MED ORDER — PEGFILGRASTIM 6 MG/0.6ML ~~LOC~~ PSKT
PREFILLED_SYRINGE | SUBCUTANEOUS | Status: AC
  Filled 2019-05-04: qty 0.6

## 2019-05-04 MED ORDER — SODIUM CHLORIDE 0.9 % IV SOLN
30.0000 mg/m2 | Freq: Once | INTRAVENOUS | Status: AC
  Administered 2019-05-04: 40 mg via INTRAVENOUS
  Filled 2019-05-04: qty 4

## 2019-05-04 MED ORDER — SODIUM CHLORIDE 0.9% FLUSH
10.0000 mL | INTRAVENOUS | Status: DC | PRN
  Administered 2019-05-04: 16:00:00 10 mL
  Filled 2019-05-04: qty 10

## 2019-05-04 MED ORDER — SODIUM CHLORIDE 0.9 % IV SOLN
30.0000 mg/m2 | Freq: Once | INTRAVENOUS | Status: DC
  Filled 2019-05-04: qty 4

## 2019-05-04 MED ORDER — SODIUM CHLORIDE 0.9 % IV SOLN
750.0000 mg/m2 | Freq: Once | INTRAVENOUS | Status: AC
  Administered 2019-05-04: 1102 mg via INTRAVENOUS
  Filled 2019-05-04: qty 28.98

## 2019-05-04 MED ORDER — HEPARIN SOD (PORK) LOCK FLUSH 100 UNIT/ML IV SOLN
500.0000 [IU] | Freq: Once | INTRAVENOUS | Status: AC | PRN
  Administered 2019-05-04: 500 [IU]
  Filled 2019-05-04: qty 5

## 2019-05-04 MED ORDER — SODIUM CHLORIDE 0.9 % IV SOLN
Freq: Once | INTRAVENOUS | Status: AC
  Administered 2019-05-04: 11:00:00 via INTRAVENOUS
  Filled 2019-05-04: qty 250

## 2019-05-04 MED ORDER — DEXAMETHASONE SODIUM PHOSPHATE 10 MG/ML IJ SOLN
INTRAMUSCULAR | Status: AC
  Filled 2019-05-04: qty 1

## 2019-05-04 MED ORDER — SODIUM CHLORIDE 0.9% FLUSH
10.0000 mL | Freq: Once | INTRAVENOUS | Status: AC
  Administered 2019-05-04: 10:00:00 10 mL
  Filled 2019-05-04: qty 10

## 2019-05-04 NOTE — Progress Notes (Signed)
Pt. denies complaints of dizziness. Vital signs stable. Per Lacie NP no need for IV fluid bolus prior to treatment.

## 2019-05-04 NOTE — Patient Instructions (Signed)
[facility] Discharge Instructions for Patients Receiving Chemotherapy  Today you received the following chemotherapy agents Gemzar, Taxotere  To help prevent nausea and vomiting after your treatment, we encourage you to take your nausea medication as directed by your MD   If you develop nausea and vomiting that is not controlled by your nausea medication, call the clinic.   BELOW ARE SYMPTOMS THAT SHOULD BE REPORTED IMMEDIATELY:  *FEVER GREATER THAN 100.5 F  *CHILLS WITH OR WITHOUT FEVER  NAUSEA AND VOMITING THAT IS NOT CONTROLLED WITH YOUR NAUSEA MEDICATION  *UNUSUAL SHORTNESS OF BREATH  *UNUSUAL BRUISING OR BLEEDING  TENDERNESS IN MOUTH AND THROAT WITH OR WITHOUT PRESENCE OF ULCERS  *URINARY PROBLEMS  *BOWEL PROBLEMS  UNUSUAL RASH Items with * indicate a potential emergency and should be followed up as soon as possible.  Feel free to call the clinic should you have any questions or concerns. The clinic phone number is [phone].  Please show the [facility] at check-in to the Emergency Department and triage nurse.  Coronavirus (COVID-19) Are you at risk?  Are you at risk for the Coronavirus (COVID-19)?  To be considered HIGH RISK for Coronavirus (COVID-19), you have to meet the following criteria:  . Traveled to [place], [place], [place], [place] or [place]; or in the [place] to [place], [place], [place], or [place]; and have fever, cough, and shortness of breath within the last 2 weeks of travel OR . Been in close contact with a person diagnosed with COVID-19 within the last 2 weeks and have fever, cough, and shortness of breath . IF YOU DO NOT MEET THESE CRITERIA, YOU ARE CONSIDERED LOW RISK FOR COVID-19.  What to do if you are HIGH RISK for COVID-19?  Marland Kitchen If you are having a medical emergency, call 911. . Seek medical care right away. Before you go to a doctor's office, urgent care or emergency department, call ahead and tell them  about your recent travel, contact with someone diagnosed with COVID-19, and your symptoms. You should receive instructions from your physician's office regarding next steps of care.  . When you arrive at healthcare provider, tell the healthcare staff immediately you have returned from visiting [place], [place], [place], [place] or [place]; or traveled in the [place] to [place], [place], [place], or [place]; in the last two weeks or you have been in close contact with a person diagnosed with COVID-19 in the last 2 weeks.   . Tell the health care staff about your symptoms: fever, cough and shortness of breath. . After you have been seen by a medical provider, you will be either: o Tested for (COVID-19) and discharged home on quarantine except to seek medical care if symptoms worsen, and asked to  - Stay home and avoid contact with others until you get your results (4-5 days)  - Avoid travel on public transportation if possible (such as bus, train, or airplane) or o Sent to the Emergency Department by EMS for evaluation, COVID-19 testing, and possible admission depending on your condition and test results.  What to do if you are LOW RISK for COVID-19?  Reduce your risk of any infection by using the same precautions used for avoiding the common cold or flu:  Marland Kitchen Wash your hands often with soap and warm water for at least 20 seconds.  If soap and water are not readily available, use an alcohol-based hand sanitizer with at least 60% alcohol.  Marland Kitchen  If coughing or sneezing, cover your mouth and nose by coughing or sneezing into the elbow areas of your shirt or coat, into a tissue or into your sleeve (not your hands). . Avoid shaking hands with others and consider head nods or verbal greetings only. . Avoid touching your eyes, nose, or mouth with unwashed hands.  . Avoid close contact with people who are sick. . Avoid places or events with large numbers of people in one location, like concerts or  sporting events. . Carefully consider travel plans you have or are making. . If you are planning any travel outside or inside the [place], visit the CDC's Travelers' Health webpage for the latest health notices. . If you have some symptoms but not all symptoms, continue to monitor at home and seek medical attention if your symptoms worsen. . If you are having a medical emergency, call 911.   [place] / e-Visit: [email or web address]         [facility]: [facility]: [phone]                   [facility]: [phone]

## 2019-05-04 NOTE — Progress Notes (Signed)
[place]   [phone] Fax:([phone]   Clinic Follow up Note   Patient Care Team: [name], MD as PCP - General (Family Medicine) [name], MD as Consulting Physician (Otolaryngology) [name], MD as Consulting Physician (Oncology) 05/04/2019  CHIEF COMPLAINT: f/u pancreas cancer    SUMMARY OF ONCOLOGIC HISTORY: Oncology History Overview Note  Cancer Staging Pancreatic cancer [facility]) Staging form: Exocrine Pancreas, AJCC 8th Edition - Clinical stage from 08/06/2017: Stage IV (cTX, cN0, pM1) - Signed by [name], MD on 08/12/2017     Pancreatic cancer ([place])  07/23/2017 Imaging   US abdomen limited RUQ 07/23/17 IMPRESSION: 1. Cholelithiasis.  No secondary signs of acute cholecystitis. 2. Multiple solid liver masses measuring up to 6.5 cm in the left lobe of the liver, evaluation for metastatic disease is recommended. These results will be called to the ordering clinician or representative by the Radiologist Assistant, and communication documented in the PACS or zVision Dashboard.   07/23/2017 Imaging   CT Abdomen W Contrast 07/23/17 IMPRESSION: 1. Widespread metastatic disease throughout the liver. No clear primary malignancy identified in the abdomen. The pelvis was not imaged. Tissue sampling recommended. 2. Probable adenopathy superior to the pancreatic tail. No evidence of pancreatic mass. 3. Suspected incidental hemangioma inferiorly in the right hepatic lobe. 4. Nonspecific nodularity in the breasts. The patient has undergone recent (03/25/2017 and 04/01/2017) mammography and ultrasound.   07/29/2017 Initial Diagnosis   Metastasis to liver of unknown origin ([place])   07/29/2017 PET scan   PET 07/29/17  IMPRESSION: 1. Numerous bulky liver masses are hypermetabolic compatible with malignancy. 2. Accentuated activity within or along the pancreatic tail, likely represent a primary pancreatic tumor. Consider pancreatic  protocol MRI to further work this up. 3. The peripancreatic lymph node shown above the pancreatic tail is mildly hypermetabolic favoring malignancy. 4.  Prominent stool throughout the colon favors constipation. 5. 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   08/05/2017 Pathology Results   Liver Biopsy  Diagnosis 08/05/17 Liver, needle/core biopsy - CARCINOMA. - SEE COMMENT. Microscopic Comment The malignant cells are positive for cytokeratin 7. They are negative for arginase, CDX2, cytokeratin 20, estrogen receptor, GATA-3, GCDFP, Glypican 3, Hep Par 1, Napsin A, and TTF-1. This immunohistochemical is nonspecific. Possibly primary sources include pancreatobiliary and upper gastrointestinal. Radiologic correlation is necessary. Of note, organ specific markers (GATA-3, GCDFP-breast, TTF-1, Napsin A-lung, and CDX2-colon) are negative. (JBK:ecj 08/09/2017)   08/21/2017 - 02/10/2018 Chemotherapy   FOLFIRINOX every 2 weeks starting 08/21/17. Dose reduced due to neuropahty and cytopenia    10/14/2017 Imaging   CT CAP WO Contrast 10/14/17 IMPRESSION: Evidence of known numerous liver metastases without significant interval change. No other evidence of metastatic disease within the chest, abdomen or pelvis. Cholelithiasis. Tiny pericardial effusion.   12/02/2017 Genetic Testing   Negative for pathogenic mutation.  The genes analyzed were the 83 genes on [facility]'s Multi-Cancer panel (ALK, APC, ATM, AXIN2, BAP1, BARD1, BLM, BMPR1A, BRCA1, BRCA2, BRIP1, CASR, CDC73, CDH1, CDK4, CDKN1B, CDKN1C, CDKN2A, CEBPA, CHEK2, CTNNA1, DICER1, DIS3L2, EGFR, EPCAM, FH, FLCN, GATA2, GPC3, GREM1, HOXB13, HRAS, KIT, MAX, MEN1, MET, MITF, MLH1, MSH2, MSH3, MSH6, MUTYH, NBN, NF1, NF2, NTHL1, PALB2, PDGFRA, PHOX2B, PMS2, POLD1, POLE, POT1, PRKAR1A, PTCH1, PTEN, RAD50, RAD51C, RAD51D, RB1, RECQL4, RET, RUNX1, SDHA, SDHAF2, SDHB, SDHC, SDHD, SMAD4, SMARCA4, SMARCB1, SMARCE1, STK11, SUFU, TERC, TERT, TMEM127, TP53, TSC1, TSC2,  VHL, WRN, WT1).   12/17/2017 PET scan   IMPRESSION: 1. Although the liver metastases are still visible on  the CT images, they are significantly smaller and retain no abnormal metabolic activity, consistent with response to therapy. 2. No abnormal activity within the pancreas. 3. No disease progression identified. 4. Cholelithiasis.   02/21/2018 PET scan   02/21/2018 PET Scan  IMPRESSION: 1. There are 2 new small nodules identified within the right lung which measure up to 5 mm. These are too small to reliably characterize by PET-CT, but warrant close interval follow-up. 2. Again noted are multifocal liver metastasis. The target lesion within the left lobe is slightly decreased in size when compared with the previous exam. Similar to previous exam there is no abnormal hypermetabolism above background liver activity identified within the liver lesions. 3. Gallstones.   02/23/2018 - 09/04/2018 Chemotherapy   5-FU pump infusion and liposomal irinotecan, every 2 weeks.  First cycle dose reduced due to cytopenia in the travel. Stopped on 08/25/2018 due to disease progression.    05/17/2018 Imaging   05/17/2018 PET Scan IMPRESSION: 1. Mixed response. The 2 small right lung nodules have decreased in size in the interval. Single focus of increased uptake within the liver is new from 02/21/2018 and concerning for recurrent metabolically active liver metastasis. 2. Splenomegaly.  New from previous exam. 3. Diffusely increased bone marrow activity, likely reflecting treatment related changes.   09/01/2018 PET scan   PET 09/01/18 IMPRESSION: 1. Unfortunately there recurrence of hepatic metastasis with multiple new hypermetabolic lesions in LEFT and RIGHT hepatic lobe. 2. New extra hepatic site of malignancy which appears associated the mid pancreas. Difficult to define lesion on noncontrast exam. 3. Diffuse marrow activity is favored benign. 4. No evidence of pulmonary metastasis.   12/14/2018  Imaging   CT CAP  IMPRESSION: CT demonstrates acute cholecystitis, with choledocholithiasis of the cystic duct.  These results were discussed by telephone at the time of interpretation on 12/14/2018 at 5:05 pm with Dr. Burr Medico.  Redemonstration of known liver metastases, with positive response to therapy, interval reduction in size of all lesions, with multiple demonstrating significant internal necrosis.  No acute finding of the chest.  Ancillary findings as above.   03/21/2019 Imaging   CT CAP W Contrast IMPRESSION: 1. Numerous rim enhancing liver metastases show overall trend towards mild progression although 1 of the index lesions is stable in the interval. 2. Interval development of portocaval lymphadenopathy concerning for disease progression. 3. Slight progression of main duct dilatation in the pancreas with abrupt cut off in the region of the body. 4. Interval development of a 9 mm subpleural nodule at the right base along the posterior hemidiaphragm. Close attention on follow-up recommended as metastatic disease not excluded. 5. New splenomegaly. 6. Cholelithiasis with diffuse gallbladder wall thickening. Gallbladder wall thickening is decreased in the interval. Stones and sludge noted in the gallbladder lumen.    Chemotherapy   Third-line GTX with Xeloda BID day 1-14, off for 1 week with Docetaxel and Gemcitbine on day 4 and day 11 every 3 weeks starting oral chemo on 04/10/19.    04/13/2019 -  Chemotherapy   The patient had DOCEtaxel (TAXOTERE) 40 mg in sodium chloride 0.9 % 100 mL chemo infusion, 30 mg/m2 = 40 mg (100 % of original dose 30 mg/m2), Intravenous,  Once, 2 of 4 cycles Dose modification: 30 mg/m2 (original dose 30 mg/m2, Cycle 1, Reason: Provider Judgment) Administration: 40 mg (04/13/2019), 40 mg (04/20/2019) gemcitabine (GEMZAR) 1,102 mg in sodium chloride 0.9 % 250 mL chemo infusion, 750 mg/m2 = 1,102 mg (100 % of original dose 750 mg/m2),  Intravenous,   Once, 2 of 4 cycles Dose modification: 750 mg/m2 (original dose 750 mg/m2, Cycle 1, Reason: Provider Judgment) Administration: 1,102 mg (04/13/2019), 1,102 mg (04/20/2019)  for chemotherapy treatment.    Pancreatic cancer metastasized to liver ([place])  08/11/2017 Initial Diagnosis   Pancreatic cancer metastasized to liver ([place])   09/12/2018 - 03/27/2019 Chemotherapy   second-line Gemcitabine and Abraxane for 2 weeks on, 1 week off starting 09/12/2018. Due to neuropathy, I will start her with dose reduced Abraxane. Stopped after 03/27/19 due to disease progression.     Chemotherapy   Third-line GTX with Xeloda BID day 1-14, off for 1 week with Docetaxel and Gemcitbine on day 4 and day 11 every 3 weeks starting oral chemo on 04/10/19.    04/13/2019 -  Chemotherapy   The patient had DOCEtaxel (TAXOTERE) 40 mg in sodium chloride 0.9 % 100 mL chemo infusion, 30 mg/m2 = 40 mg (100 % of original dose 30 mg/m2), Intravenous,  Once, 2 of 4 cycles Dose modification: 30 mg/m2 (original dose 30 mg/m2, Cycle 1, Reason: Provider Judgment) Administration: 40 mg (04/13/2019), 40 mg (04/20/2019) gemcitabine (GEMZAR) 1,102 mg in sodium chloride 0.9 % 250 mL chemo infusion, 750 mg/m2 = 1,102 mg (100 % of original dose 750 mg/m2), Intravenous,  Once, 2 of 4 cycles Dose modification: 750 mg/m2 (original dose 750 mg/m2, Cycle 1, Reason: Provider Judgment) Administration: 1,102 mg (04/13/2019), 1,102 mg (04/20/2019)  for chemotherapy treatment.      CURRENT THERAPY: Third-line GTX with Xeloda BID day 1-14, off for 1 week with Docetaxel and Gemcitbine on day 4 and day 11 every 3 weeks starting oral chemo on 04/10/19.  INTERVAL HISTORY: Ms. Yates was seen in infusion room for f/u and treatment as scheduled. She completed cycle 1 on 04/20/19. Her fatigue and appetite improve on her week off chemo. She feels well today, on day 4 Xeloda. Had a sore mouth which she attributes to periodic "perio problems." Does not feel this was true  chemo mucositis. Able to eat and drink. Gags intermittently on her toothbrush without other n/v. Has loose BM in the morning, no significant diarrhea. Denies bleeding. Neuropathy is stable over last few weeks, on gabapentin. Denies hand/foot redness or pain. Denies abdominal pain. Denies recent fever or chills. No cough, chest pain, dyspnea, leg swelling.    MEDICAL HISTORY:  Past Medical History:  Diagnosis Date   Diarrhea of presumed infectious origin 01/12/2018   Gastroenteritis 01/12/2018   Genetic testing 12/02/2017   Multi-Cancer panel (83 genes) @ [facility] - No pathogenic mutations detected   Meniere disease 2016   Pancreatitis     SURGICAL HISTORY: Past Surgical History:  Procedure Laterality Date   CERVICAL ABLATION     ENDOMETRIAL ABLATION  2011   IR CHOLANGIOGRAM EXISTING TUBE  01/09/2019   IR FLUORO GUIDE PORT INSERTION RIGHT  08/12/2017   IR PERC CHOLECYSTOSTOMY  12/15/2018   IR US GUIDE VASC ACCESS RIGHT  08/12/2017   MANDIBLE SURGERY  1999    I have reviewed the social history and family history with the patient and they are unchanged from previous note.  ALLERGIES:  has No Known Allergies.  MEDICATIONS:  Current Outpatient Medications  Medication Sig Dispense Refill   b complex vitamins tablet Take 1 tablet by mouth daily.     capecitabine (XELODA) 500 MG tablet Take 2 tablets (1,000 mg total) by mouth 2 (two) times daily after a meal. Take on days 1-14 of each 21 day cycle 56 tablet  2   cyclobenzaprine (FLEXERIL) 5 MG tablet Take 1 tablet (5 mg total) by mouth 3 (three) times daily as needed for muscle spasms. 30 tablet 0   gabapentin (NEURONTIN) 100 MG capsule TAKE ONE CAPSULE BY MOUTH THREE TIMES DAILY (Patient taking differently: Take 100 mg by mouth 2 (two) times daily. ) 270 capsule 1   loperamide (IMODIUM) 2 MG capsule Take 1 capsule (2 mg total) by mouth as needed for diarrhea or loose stools. 30 capsule 0   loratadine (CLARITIN) 10 MG tablet  Take 10 mg by mouth daily as needed for allergies.     magic mouthwash w/lidocaine SOLN Take 10 mLs by mouth 3 (three) times daily as needed for mouth pain. Swish and spit 10 ML by mouth 3 times daily as needed for mouth pain. 240 mL 0   meclizine (ANTIVERT) 25 MG tablet Take 25 mg by mouth 3 (three) times daily as needed for dizziness.     ondansetron (ZOFRAN-ODT) 8 MG disintegrating tablet TAKE 1 TABLET BY MOUTH EVERY 8 HOURS AS NEEDED FOR NAUSE OR VOMITING (Patient taking differently: Take 8 mg by mouth every 8 (eight) hours as needed for nausea or vomiting. ) 40 tablet 0   potassium chloride (K-DUR) 10 MEQ tablet Take 1 tablet (10 mEq total) by mouth 4 (four) times daily. (Patient taking differently: Take 30 mEq by mouth daily. ) 360 tablet 2   prochlorperazine (COMPAZINE) 10 MG tablet Take 1 tablet (10 mg total) by mouth every 6 (six) hours as needed for nausea or vomiting. 40 tablet 2   PROMACTA 25 MG tablet TAKE 1 TABLET BY MOUTH ONCE DAILY ON AN EMPTY STOMACH  AT LEAST 1 HOUR BEFORE OR 2 HOURS AFTER A MEAL 30 tablet 0   Rivaroxaban (XARELTO) 15 MG TABS tablet Take 1 tablet (15 mg total) by mouth daily. 90 tablet 1   traMADol (ULTRAM) 50 MG tablet Take 1 tablet (50 mg total) by mouth every 6 (six) hours as needed for moderate pain or severe pain. 30 tablet 0   No current facility-administered medications for this visit.    Facility-Administered Medications Ordered in Other Visits  Medication Dose Route Frequency Provider Last Rate Last Dose   DOCEtaxel (TAXOTERE) 40 mg in sodium chloride 0.9 % 100 mL chemo infusion  30 mg/m2 (Treatment Plan Recorded) Intravenous Once Alla Feeling, NP       heparin lock flush 100 unit/mL  500 Units Intracatheter Once PRN [name], MD       sodium chloride flush (NS) 0.9 % injection 10 mL  10 mL Intracatheter Once [name], MD       sodium chloride flush (NS) 0.9 % injection 10 mL  10 mL Intracatheter PRN [name], MD        PHYSICAL  EXAMINATION: ECOG PERFORMANCE STATUS: 1 - Symptomatic but completely ambulatory Vitals per infusion flow sheet   GENERAL:alert, no distress and comfortable SKIN: no obvious rash. Palms without erythema  EYES: sclera clear OROPHARYNX: no thrush or ulcers LUNGS:respirations even and unlabored with normal breathing effort HEART:  no lower extremity edema ABDOMEN:abdomen flat Musculoskeletal:no cyanosis of digits NEURO: alert & oriented x 3 with fluent speech PAC without erythema   LABORATORY DATA:  I have reviewed the data as listed CBC Latest Ref Rng & Units 05/04/2019 04/20/2019 04/13/2019  WBC 4.0 - 10.5 K/uL 3.1(L) 3.2(L) 8.9  Hemoglobin 12.0 - 15.0 g/dL 8.5(L) 8.1(L) 8.4(L)  Hematocrit 36.0 - 46.0 % 27.3(L) 25.3(L) 26.5(L)  Platelets 150 - 400 K/uL 187 234 599(H)     CMP Latest Ref Rng & Units 05/04/2019 04/20/2019 04/13/2019  Glucose 70 - 99 mg/dL 114(H) 153(H) 134(H)  BUN 6 - 20 mg/dL 17 23(H) 19  Creatinine 0.44 - 1.00 mg/dL 1.14(H) 1.25(H) 1.15(H)  Sodium 135 - 145 mmol/L 141 137 136  Potassium 3.5 - 5.1 mmol/L 3.6 3.5 3.8  Chloride 98 - 111 mmol/L 105 101 100  CO2 22 - 32 mmol/L _0 Calcium 8.9 - 10.3 mg/dL 9.3 9.5 9.6  Total Protein 6.5 - 8.1 g/dL 7.2 7.5 7.8  Total Bilirubin 0.3 - 1.2 mg/dL 0.6 0.6 0.7  Alkaline Phos 38 - 126 U/L 213(H) 382(H) 480(H)  AST 15 - 41 U/L 46(H) 45(H) 62(H)  ALT 0 - 44 U/L 30 40 54(H)      RADIOGRAPHIC STUDIES: I have personally reviewed the radiological images as listed and agreed with the findings in the report. No results found.   ASSESSMENT & PLAN: GRACIA SAGGESE a 58 y.o.femalewith   1. 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 She was treated withfirst linechemoFOLFIRINOX with dose reduction. Due to neuropathyand cytopenia, chemo changed tosecond line 5-FU pump infusion and liposomal irinotecan every 2 weekswith dose reduction -Unfortunately she  had disease progression withworseningliver mets.  -She started second-line Gemcitabine and Abraxane2 weeks on/1 week offon 09/12/18. She tolerateswell with no significant side effects except hair lossand spiked fever afterchemo -she developedacute cholecystitisand was hospitalized, s/p perc chole drain; she recovered well and resumed chemotherapy. After cycle 6 day 1 (01/02/19) she developed recurrent fever spikes and pancytopenia. Treatment was held for possible recurrent infection on 5/18. -Drain exchange was attempted in IR on 5/18 but drain fell out. Her case was discussed with IR and surgery. Surgerynot recommended due to concern for disease progression while off chemo -she is at high risk for recurrent infectiononchemotherapy -s/p cycle 10 day 1 on 03/27/19, ultimately discontinued due to disease progression  -she started third line GTX on 04/10/19; s/p cycle 1   2. Intermittent fever spikes, abdominal pain -she presented to ED for this on 8/16. CT showed stable disease. Work up essentially negative -fever spikes could be related to tumor, chemo, or infection although no evidence of that on CT -continue tramadol PRN for pain, has not needed recently   3. Weight lossandmalnutrition -Due to chemo and underlying metastatic cancer. -she uses supplements  4. Transaminitis -Due to underlying chemo and malignancy  5. Pancytopenia, secondary to chemo -resolved  6. Hypokalemia, CKD Stage III - on KCL supplements TID  7.Peripheral neuropathy, grade 2 -Currently on Neurontin 200 mg at night and B complex -stable overall, mostly in her feet  8.Acute cholecystitis, status post cholecystectomy tube placement 4/23per IR - surgery was not recommended due to high risk of complication, and concerns for cancer progression when holding chemotherapy for surgery -Drainwas placed during hospitalization, withslightlybloody drainagedaily -Her CT CAP from 12/14/18-numerus  gallstones. -Drain fell out on 5/18;imaging in 12/2018 consistent with cholecystitis with cystic duct obstruction. No leak on HIDA. Holding intervention given she is asymptomatic and invasive procedure would delay chemo. -[place] on 6/5 was done due to recurrent fever after resuming chemo; no indication for gallbladder drainage at this time -given prophylactic 5-day course levaquin on 04/13/19 due to fever spikes during chemo  -no clinical evidence of recurrent infection today  9. LE DVT -Diagnosed on December 15, 2018, when she was hospitalized for cholecystitis -  Due to her bloody biliary drainage, and moderate thrombocytopenia from chemotherapy, she is on low-dosexarelto91m daily (no loading dose)  10.Goal of care discussion -treatment goal is palliative  Disposition:  Ms. HSicardappears stable. She completed cycle 1 GTX, she tolerated well overall with mild fatigue and decreased appetite that improved off treatment. No significant GI or skin toxicities. She is currently on cycle 2 day 4 Xeloda 1000 mg BID. Labs adequate to continue. She will proceed with gemcitabine and taxotere today. She will return in 1 week for f/u and day 11. She will complete 14 days of Xeloda, then off one week. Going forward, she prefers treatments on Wednesday so she can attend bible study on Thursday mornings, will adjust schedule accordingly. She knows to monitor for signs of recurrence infection.   All questions were answered. The patient knows to call the clinic with any problems, questions or concerns. No barriers to learning was detected.     LAlla Feeling NP 05/04/19

## 2019-05-05 ENCOUNTER — Telehealth: Payer: Self-pay | Admitting: Nurse Practitioner

## 2019-05-05 NOTE — Telephone Encounter (Signed)
Scheduled appt per 9/10 los.  Spoke with patient and she is aware of her appt date and time.  Per Tanner it was okay to schedule with him on 9/30....(YF and LB were both booked and patient wanted her appt on 9/30 and was okay with seeing another PA...due to her having to take a chemo pill and wanted to keep everything on track)

## 2019-05-10 NOTE — Progress Notes (Signed)
[place]   [phone] [phone]   Clinic Follow up Note   Patient Care Team: [name], MD as PCP - General (Family Medicine) [name], MD as Consulting Physician (Otolaryngology) [name], MD as Consulting Physician (Oncology) 05/11/2019  CHIEF COMPLAINT: F/u pancreas cancer   SUMMARY OF ONCOLOGIC HISTORY: Oncology History Overview Note  Cancer Staging Pancreatic cancer [facility]) Staging form: Exocrine Pancreas, AJCC 8th Edition - Clinical stage from 08/06/2017: Stage IV (cTX, cN0, pM1) - Signed by [name], MD on 08/12/2017     Pancreatic cancer ([place])  07/23/2017 Imaging   US abdomen limited RUQ 07/23/17 IMPRESSION: 1. Cholelithiasis.  No secondary signs of acute cholecystitis. 2. Multiple solid liver masses measuring up to 6.5 cm in the left lobe of the liver, evaluation for metastatic disease is recommended. These results will be called to the ordering clinician or representative by the Radiologist Assistant, and communication documented in the PACS or zVision Dashboard.   07/23/2017 Imaging   CT Abdomen W Contrast 07/23/17 IMPRESSION: 1. Widespread metastatic disease throughout the liver. No clear primary malignancy identified in the abdomen. The pelvis was not imaged. Tissue sampling recommended. 2. Probable adenopathy superior to the pancreatic tail. No evidence of pancreatic mass. 3. Suspected incidental hemangioma inferiorly in the right hepatic lobe. 4. Nonspecific nodularity in the breasts. The patient has undergone recent (03/25/2017 and 04/01/2017) mammography and ultrasound.   07/29/2017 Initial Diagnosis   Metastasis to liver of unknown origin ([place])   07/29/2017 PET scan   PET 07/29/17  IMPRESSION: 1. Numerous bulky liver masses are hypermetabolic compatible with malignancy. 2. Accentuated activity within or along the pancreatic tail, likely represent a primary pancreatic tumor. Consider pancreatic  protocol MRI to further work this up. 3. The peripancreatic lymph node shown above the pancreatic tail is mildly hypermetabolic favoring malignancy. 4.  Prominent stool throughout the colon favors constipation. 5. 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   08/05/2017 Pathology Results   Liver Biopsy  Diagnosis 08/05/17 Liver, needle/core biopsy - CARCINOMA. - SEE COMMENT. Microscopic Comment The malignant cells are positive for cytokeratin 7. They are negative for arginase, CDX2, cytokeratin 20, estrogen receptor, GATA-3, GCDFP, Glypican 3, Hep Par 1, Napsin A, and TTF-1. This immunohistochemical is nonspecific. Possibly primary sources include pancreatobiliary and upper gastrointestinal. Radiologic correlation is necessary. Of note, organ specific markers (GATA-3, GCDFP-breast, TTF-1, Napsin A-lung, and CDX2-colon) are negative. (JBK:ecj 08/09/2017)   08/21/2017 - 02/10/2018 Chemotherapy   FOLFIRINOX every 2 weeks starting 08/21/17. Dose reduced due to neuropahty and cytopenia    10/14/2017 Imaging   CT CAP WO Contrast 10/14/17 IMPRESSION: Evidence of known numerous liver metastases without significant interval change. No other evidence of metastatic disease within the chest, abdomen or pelvis. Cholelithiasis. Tiny pericardial effusion.   12/02/2017 Genetic Testing   Negative for pathogenic mutation.  The genes analyzed were the 83 genes on [facility]'s Multi-Cancer panel (ALK, APC, ATM, AXIN2, BAP1, BARD1, BLM, BMPR1A, BRCA1, BRCA2, BRIP1, CASR, CDC73, CDH1, CDK4, CDKN1B, CDKN1C, CDKN2A, CEBPA, CHEK2, CTNNA1, DICER1, DIS3L2, EGFR, EPCAM, FH, FLCN, GATA2, GPC3, GREM1, HOXB13, HRAS, KIT, MAX, MEN1, MET, MITF, MLH1, MSH2, MSH3, MSH6, MUTYH, NBN, NF1, NF2, NTHL1, PALB2, PDGFRA, PHOX2B, PMS2, POLD1, POLE, POT1, PRKAR1A, PTCH1, PTEN, RAD50, RAD51C, RAD51D, RB1, RECQL4, RET, RUNX1, SDHA, SDHAF2, SDHB, SDHC, SDHD, SMAD4, SMARCA4, SMARCB1, SMARCE1, STK11, SUFU, TERC, TERT, TMEM127, TP53, TSC1, TSC2,  VHL, WRN, WT1).   12/17/2017 PET scan   IMPRESSION: 1. Although the liver metastases are still visible on  the CT images, they are significantly smaller and retain no abnormal metabolic activity, consistent with response to therapy. 2. No abnormal activity within the pancreas. 3. No disease progression identified. 4. Cholelithiasis.   02/21/2018 PET scan   02/21/2018 PET Scan  IMPRESSION: 1. There are 2 new small nodules identified within the right lung which measure up to 5 mm. These are too small to reliably characterize by PET-CT, but warrant close interval follow-up. 2. Again noted are multifocal liver metastasis. The target lesion within the left lobe is slightly decreased in size when compared with the previous exam. Similar to previous exam there is no abnormal hypermetabolism above background liver activity identified within the liver lesions. 3. Gallstones.   02/23/2018 - 09/04/2018 Chemotherapy   5-FU pump infusion and liposomal irinotecan, every 2 weeks.  First cycle dose reduced due to cytopenia in the travel. Stopped on 08/25/2018 due to disease progression.    05/17/2018 Imaging   05/17/2018 PET Scan IMPRESSION: 1. Mixed response. The 2 small right lung nodules have decreased in size in the interval. Single focus of increased uptake within the liver is new from 02/21/2018 and concerning for recurrent metabolically active liver metastasis. 2. Splenomegaly.  New from previous exam. 3. Diffusely increased bone marrow activity, likely reflecting treatment related changes.   09/01/2018 PET scan   PET 09/01/18 IMPRESSION: 1. Unfortunately there recurrence of hepatic metastasis with multiple new hypermetabolic lesions in LEFT and RIGHT hepatic lobe. 2. New extra hepatic site of malignancy which appears associated the mid pancreas. Difficult to define lesion on noncontrast exam. 3. Diffuse marrow activity is favored benign. 4. No evidence of pulmonary metastasis.   12/14/2018  Imaging   CT CAP  IMPRESSION: CT demonstrates acute cholecystitis, with choledocholithiasis of the cystic duct.  These results were discussed by telephone at the time of interpretation on 12/14/2018 at 5:05 pm with Dr. Burr Medico.  Redemonstration of known liver metastases, with positive response to therapy, interval reduction in size of all lesions, with multiple demonstrating significant internal necrosis.  No acute finding of the chest.  Ancillary findings as above.   03/21/2019 Imaging   CT CAP W Contrast IMPRESSION: 1. Numerous rim enhancing liver metastases show overall trend towards mild progression although 1 of the index lesions is stable in the interval. 2. Interval development of portocaval lymphadenopathy concerning for disease progression. 3. Slight progression of main duct dilatation in the pancreas with abrupt cut off in the region of the body. 4. Interval development of a 9 mm subpleural nodule at the right base along the posterior hemidiaphragm. Close attention on follow-up recommended as metastatic disease not excluded. 5. New splenomegaly. 6. Cholelithiasis with diffuse gallbladder wall thickening. Gallbladder wall thickening is decreased in the interval. Stones and sludge noted in the gallbladder lumen.    Chemotherapy   Third-line GTX with Xeloda BID day 1-14, off for 1 week with Docetaxel and Gemcitbine on day 4 and day 11 every 3 weeks starting oral chemo on 04/10/19.    04/13/2019 -  Chemotherapy   The patient had DOCEtaxel (TAXOTERE) 40 mg in sodium chloride 0.9 % 100 mL chemo infusion, 30 mg/m2 = 40 mg (100 % of original dose 30 mg/m2), Intravenous,  Once, 2 of 4 cycles Dose modification: 30 mg/m2 (original dose 30 mg/m2, Cycle 1, Reason: Provider Judgment) Administration: 40 mg (04/13/2019), 40 mg (04/20/2019) gemcitabine (GEMZAR) 1,102 mg in sodium chloride 0.9 % 250 mL chemo infusion, 750 mg/m2 = 1,102 mg (100 % of original dose 750 mg/m2),  Intravenous,   Once, 2 of 4 cycles Dose modification: 750 mg/m2 (original dose 750 mg/m2, Cycle 1, Reason: Provider Judgment), 600 mg/m2 (original dose 750 mg/m2, Cycle 2, Reason: Provider Judgment, Comment: neutropenia ), 600 mg/m2 (original dose 600 mg/m2, Cycle 2, Reason: Provider Judgment, Comment: low ANC) Administration: 1,102 mg (04/13/2019), 1,102 mg (04/20/2019), 1,102 mg (05/04/2019)  for chemotherapy treatment.    Pancreatic cancer metastasized to liver ([place])  08/11/2017 Initial Diagnosis   Pancreatic cancer metastasized to liver ([place])   09/12/2018 - 03/27/2019 Chemotherapy   second-line Gemcitabine and Abraxane for 2 weeks on, 1 week off starting 09/12/2018. Due to neuropathy, I will start her with dose reduced Abraxane. Stopped after 03/27/19 due to disease progression.     Chemotherapy   Third-line GTX with Xeloda BID day 1-14, off for 1 week with Docetaxel and Gemcitbine on day 4 and day 11 every 3 weeks starting oral chemo on 04/10/19.    04/13/2019 -  Chemotherapy   The patient had DOCEtaxel (TAXOTERE) 40 mg in sodium chloride 0.9 % 100 mL chemo infusion, 30 mg/m2 = 40 mg (100 % of original dose 30 mg/m2), Intravenous,  Once, 2 of 4 cycles Dose modification: 30 mg/m2 (original dose 30 mg/m2, Cycle 1, Reason: Provider Judgment) Administration: 40 mg (04/13/2019), 40 mg (04/20/2019) gemcitabine (GEMZAR) 1,102 mg in sodium chloride 0.9 % 250 mL chemo infusion, 750 mg/m2 = 1,102 mg (100 % of original dose 750 mg/m2), Intravenous,  Once, 2 of 4 cycles Dose modification: 750 mg/m2 (original dose 750 mg/m2, Cycle 1, Reason: Provider Judgment), 600 mg/m2 (original dose 750 mg/m2, Cycle 2, Reason: Provider Judgment, Comment: neutropenia ), 600 mg/m2 (original dose 600 mg/m2, Cycle 2, Reason: Provider Judgment, Comment: low ANC) Administration: 1,102 mg (04/13/2019), 1,102 mg (04/20/2019), 1,102 mg (05/04/2019)  for chemotherapy treatment.      CURRENT THERAPY: Third-line GTX with Xeloda BID day 1-14, off for 1  week with Docetaxel and Gemcitbine on day 4 and day 11 every 3 weeks starting oral chemo on 04/10/19.  INTERVAL HISTORY: Morgan Dawson returns for f/u and treatment as scheduled. She completed cycle 2 day 4 gemcitabine and taxotere on 05/04/19, she is currently on Xeloda day 11 of Xeloda. She feels very well. Eating and drinking well. Denies mucositis. Stools are soft, normal for her. Denies n/v or abdominal pain. Denies hand/foot redness or pain. Neuropathy is stable. No recent fever, chills, cough, chest pain, dyspnea, leg swelling.    MEDICAL HISTORY:  Past Medical History:  Diagnosis Date   Diarrhea of presumed infectious origin 01/12/2018   Gastroenteritis 01/12/2018   Genetic testing 12/02/2017   Multi-Cancer panel (83 genes) @ [facility] - No pathogenic mutations detected   Meniere disease 2016   Pancreatitis     SURGICAL HISTORY: Past Surgical History:  Procedure Laterality Date   CERVICAL ABLATION     ENDOMETRIAL ABLATION  2011   IR CHOLANGIOGRAM EXISTING TUBE  01/09/2019   IR FLUORO GUIDE PORT INSERTION RIGHT  08/12/2017   IR PERC CHOLECYSTOSTOMY  12/15/2018   IR US GUIDE VASC ACCESS RIGHT  08/12/2017   MANDIBLE SURGERY  1999    I have reviewed the social history and family history with the patient and they are unchanged from previous note.  ALLERGIES:  has No Known Allergies.  MEDICATIONS:  Current Outpatient Medications  Medication Sig Dispense Refill   b complex vitamins tablet Take 1 tablet by mouth daily.     capecitabine (XELODA) 500 MG tablet Take 2 tablets (1,000 mg  total) by mouth 2 (two) times daily after a meal. Take on days 1-14 of each 21 day cycle 56 tablet 2   cyclobenzaprine (FLEXERIL) 5 MG tablet Take 1 tablet (5 mg total) by mouth 3 (three) times daily as needed for muscle spasms. 30 tablet 0   gabapentin (NEURONTIN) 100 MG capsule TAKE ONE CAPSULE BY MOUTH THREE TIMES DAILY (Patient taking differently: Take 100 mg by mouth 2 (two) times daily. )  270 capsule 1   lidocaine-prilocaine (EMLA) cream Apply 1 application topically as needed.     loperamide (IMODIUM) 2 MG capsule Take 1 capsule (2 mg total) by mouth as needed for diarrhea or loose stools. 30 capsule 0   loratadine (CLARITIN) 10 MG tablet Take 10 mg by mouth daily as needed for allergies.     magic mouthwash w/lidocaine SOLN Take 10 mLs by mouth 3 (three) times daily as needed for mouth pain. Swish and spit 10 ML by mouth 3 times daily as needed for mouth pain. 240 mL 0   meclizine (ANTIVERT) 25 MG tablet Take 25 mg by mouth 3 (three) times daily as needed for dizziness.     ondansetron (ZOFRAN-ODT) 8 MG disintegrating tablet TAKE 1 TABLET BY MOUTH EVERY 8 HOURS AS NEEDED FOR NAUSE OR VOMITING (Patient taking differently: Take 8 mg by mouth every 8 (eight) hours as needed for nausea or vomiting. ) 40 tablet 0   potassium chloride (K-DUR) 10 MEQ tablet Take 1 tablet (10 mEq total) by mouth 4 (four) times daily. (Patient taking differently: Take 30 mEq by mouth daily. ) 360 tablet 2   prochlorperazine (COMPAZINE) 10 MG tablet Take 1 tablet (10 mg total) by mouth every 6 (six) hours as needed for nausea or vomiting. 40 tablet 2   PROMACTA 25 MG tablet TAKE 1 TABLET BY MOUTH ONCE DAILY ON AN EMPTY STOMACH  AT LEAST 1 HOUR BEFORE OR 2 HOURS AFTER A MEAL 30 tablet 0   Rivaroxaban (XARELTO) 15 MG TABS tablet Take 1 tablet (15 mg total) by mouth daily. 90 tablet 1   traMADol (ULTRAM) 50 MG tablet Take 1 tablet (50 mg total) by mouth every 6 (six) hours as needed for moderate pain or severe pain. 30 tablet 0   levofloxacin (LEVAQUIN) 500 MG tablet Take 1 tablet (500 mg total) by mouth daily. 7 tablet 0   No current facility-administered medications for this visit.    Facility-Administered Medications Ordered in Other Visits  Medication Dose Route Frequency Provider Last Rate Last Dose   dexamethasone (DECADRON) injection 10 mg  10 mg Intravenous Once [name], MD        DOCEtaxel (TAXOTERE) 40 mg in sodium chloride 0.9 % 100 mL chemo infusion  30 mg/m2 (Treatment Plan Recorded) Intravenous Once [name], MD       gemcitabine (GEMZAR) 912 mg in sodium chloride 0.9 % 100 mL chemo infusion  600 mg/m2 (Treatment Plan Recorded) Intravenous Once [name], MD       heparin lock flush 100 unit/mL  500 Units Intracatheter Once PRN [name], MD       sodium chloride flush (NS) 0.9 % injection 10 mL  10 mL Intracatheter Once [name], MD       sodium chloride flush (NS) 0.9 % injection 10 mL  10 mL Intracatheter PRN [name], MD        PHYSICAL EXAMINATION: ECOG PERFORMANCE STATUS: 0 - Asymptomatic  Vitals:   05/11/19 1127  BP: 114/76  Pulse:  84  Resp: 16  Temp: 98.3 F (36.8 C)  SpO2: 98%   Filed Weights   05/11/19 1127  Weight: 104 lb 12.8 oz (47.5 kg)    GENERAL:alert, no distress and comfortable SKIN: no obvious rash  EYES:  sclera clear OROPHARYNX: no thrush or ulcers  NECK: without mass  LUNGS: clear with normal breathing effort HEART: regular rate & rhythm, no lower extremity edema ABDOMEN:abdomen soft, non-tender and normal bowel sounds Musculoskeletal:no cyanosis of digits NEURO: alert & oriented x 3 with fluent speech, normal gait PAC without erythema   LABORATORY DATA:  I have reviewed the data as listed CBC Latest Ref Rng & Units 05/11/2019 05/04/2019 04/20/2019  WBC 4.0 - 10.5 K/uL 1.2(L) 3.1(L) 3.2(L)  Hemoglobin 12.0 - 15.0 g/dL 8.3(L) 8.5(L) 8.1(L)  Hematocrit 36.0 - 46.0 % 25.8(L) 27.3(L) 25.3(L)  Platelets 150 - 400 K/uL 152 187 234     CMP Latest Ref Rng & Units 05/11/2019 05/04/2019 04/20/2019  Glucose 70 - 99 mg/dL 122(H) 114(H) 153(H)  BUN 6 - 20 mg/dL 23(H) 17 23(H)  Creatinine 0.44 - 1.00 mg/dL 1.02(H) 1.14(H) 1.25(H)  Sodium 135 - 145 mmol/L 140 141 137  Potassium 3.5 - 5.1 mmol/L 3.7 3.6 3.5  Chloride 98 - 111 mmol/L 103 105 101  CO2 22 - 32 mmol/L _0 Calcium 8.9 - 10.3 mg/dL 9.5 9.3 9.5  Total Protein  6.5 - 8.1 g/dL 7.2 7.2 7.5  Total Bilirubin 0.3 - 1.2 mg/dL 0.8 0.6 0.6  Alkaline Phos 38 - 126 U/L 185(H) 213(H) 382(H)  AST 15 - 41 U/L 58(H) 46(H) 45(H)  ALT 0 - 44 U/L 48(H) 30 40      RADIOGRAPHIC STUDIES: I have personally reviewed the radiological images as listed and agreed with the findings in the report. No results found.   ASSESSMENT & PLAN: Morgan Dawson a 58 y.o.femalewith   1. 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 She was treated withfirst linechemoFOLFIRINOX with dose reduction. Due to neuropathyand cytopenia, chemo changed tosecond line 5-FU pump infusion and liposomal irinotecan every 2 weekswith dose reduction -Unfortunately she had disease progression withworseningliver mets.  -She started second-line Gemcitabine and Abraxane2 weeks on/1 week offon 09/12/18. She tolerateswell with no significant side effects except hair lossand spiked fever afterchemo -she developedacute cholecystitisand was hospitalized, s/p perc chole drain; she recovered well and resumed chemotherapy. After cycle 6 day 1 (01/02/19) she developed recurrent fever spikes and pancytopenia. Treatment was held for possible recurrent infection on 5/18. -Drain exchange was attempted in IR on 5/18 but drain fell out. Her case was discussed with IR and surgery. Surgerynot recommended due to concern for disease progression while off chemo -she is at high risk for recurrent infectiononchemotherapy -s/p cycle 10 day 1 on 03/27/19, ultimately discontinued due to disease progression  -she started third line GTX on 04/10/19; s/p cycle 2 day 4, tolerating well   2. Intermittent fever spikes, abdominal pain -she presented to ED for this on 8/16. CT showed stable disease. Work up essentially negative -fever spikes could be related to tumor, chemo, or infection although no evidence of that on CT -continue tramadol PRN for pain,  has not needed recently   3. Weight lossandmalnutrition -Due to chemo and underlying metastatic cancer. -she uses supplements -gradually increased   4. Transaminitis -Due to underlying chemo and malignancy -improved  5. Pancytopenia, secondary to chemo  6. Hypokalemia, CKD Stage III - on KCL  supplements TID  7.Peripheral neuropathy, grade 2 -Currently on Neurontin 200 mg at night and B complex -stable overall, mostly in her feet  8.Acute cholecystitis, status post cholecystectomy tube placement 4/23per IR - surgery was not recommended due to high risk of complication, and concerns for cancer progression when holding chemotherapy for surgery -Drainwas placed during hospitalization, withslightlybloody drainagedaily -Her CT CAP from 12/14/18-numerus gallstones. -Drain fell out on 5/18;imaging in 12/2018 consistent with cholecystitis with cystic duct obstruction. No leak on HIDA. Holding intervention given she is asymptomatic and invasive procedure would delay chemo. -[place] on 6/5 was done due to recurrent fever after resuming chemo; no indication for gallbladder drainage at this time -given prophylactic 5-day course levaquin on 04/13/19 due to fever spikes during chemo -no clinical evidence of recurrent infection today  9. LE DVT -Diagnosed on December 15, 2018, when she was hospitalized for cholecystitis -Due to her bloody biliary drainage, and moderate thrombocytopenia from chemotherapy, she is on low-dosexarelto82m daily (no loading dose)  10.Goal of care discussion -treatment goal is palliative  Disposition: Ms. HBrandauappears well. She completed cycle 2 day 4 GTX, currently on day 11 Xeloda. She is tolerating treatment well without significant toxicities. Labs reviewed, Cr and LFTs are improved. Anemia is stable. She has developed neutropenia, ANC 0.7. she is afebrile, no signs of active/recurrent infection. Ms. HWhiterswould like to continue treatment if  possible. She will proceed with treatment today as planned, with dose reduced gemcitabine. She will stop Xeloda today. She will be given a prophylactic course of levaquin. We reviewed signs and symptoms of recurrent infection. She will return for granix injection on 05/13/19 if her insurance will approve. Will repeat CBC in one week to ensure neutropenia improved/resolved before starting next cycle. The plan was reviewed with Dr. FBurr Medico and the patient agrees.   She is interested in clinical trials when the time comes. [place] [facility] [place], she will also do her own search.   All questions were answered. The patient knows to call the clinic with any problems, questions or concerns. No barriers to learning was detected. A total of 25 minutes was spent in today's encounter, with >50% spent reviewing test results and coordination of care.      LAlla Feeling NP 05/11/19

## 2019-05-11 ENCOUNTER — Inpatient Hospital Stay (HOSPITAL_BASED_OUTPATIENT_CLINIC_OR_DEPARTMENT_OTHER): Payer: 59 | Admitting: Nurse Practitioner

## 2019-05-11 ENCOUNTER — Telehealth: Payer: Self-pay | Admitting: Nurse Practitioner

## 2019-05-11 ENCOUNTER — Inpatient Hospital Stay: Payer: 59

## 2019-05-11 ENCOUNTER — Other Ambulatory Visit: Payer: Self-pay

## 2019-05-11 ENCOUNTER — Encounter: Payer: Self-pay | Admitting: Nurse Practitioner

## 2019-05-11 VITALS — BP 114/76 | HR 84 | Temp 98.3°F | Resp 16 | Ht 65.0 in | Wt 104.8 lb

## 2019-05-11 DIAGNOSIS — C787 Secondary malignant neoplasm of liver and intrahepatic bile duct: Secondary | ICD-10-CM

## 2019-05-11 DIAGNOSIS — T451X5A Adverse effect of antineoplastic and immunosuppressive drugs, initial encounter: Secondary | ICD-10-CM | POA: Diagnosis not present

## 2019-05-11 DIAGNOSIS — Z5111 Encounter for antineoplastic chemotherapy: Secondary | ICD-10-CM | POA: Diagnosis not present

## 2019-05-11 DIAGNOSIS — C251 Malignant neoplasm of body of pancreas: Secondary | ICD-10-CM

## 2019-05-11 DIAGNOSIS — D701 Agranulocytosis secondary to cancer chemotherapy: Secondary | ICD-10-CM | POA: Diagnosis not present

## 2019-05-11 DIAGNOSIS — C259 Malignant neoplasm of pancreas, unspecified: Secondary | ICD-10-CM

## 2019-05-11 DIAGNOSIS — Z7189 Other specified counseling: Secondary | ICD-10-CM

## 2019-05-11 DIAGNOSIS — Z95828 Presence of other vascular implants and grafts: Secondary | ICD-10-CM

## 2019-05-11 LAB — CBC WITH DIFFERENTIAL/PLATELET
Abs Immature Granulocytes: 0.01 10*3/uL (ref 0.00–0.07)
Basophils Absolute: 0 10*3/uL (ref 0.0–0.1)
Basophils Relative: 3 %
Eosinophils Absolute: 0 10*3/uL (ref 0.0–0.5)
Eosinophils Relative: 3 %
HCT: 25.8 % — ABNORMAL LOW (ref 36.0–46.0)
Hemoglobin: 8.3 g/dL — ABNORMAL LOW (ref 12.0–15.0)
Immature Granulocytes: 1 %
Lymphocytes Relative: 27 %
Lymphs Abs: 0.3 10*3/uL — ABNORMAL LOW (ref 0.7–4.0)
MCH: 34.2 pg — ABNORMAL HIGH (ref 26.0–34.0)
MCHC: 32.2 g/dL (ref 30.0–36.0)
MCV: 106.2 fL — ABNORMAL HIGH (ref 80.0–100.0)
Monocytes Absolute: 0.1 10*3/uL (ref 0.1–1.0)
Monocytes Relative: 11 %
Neutro Abs: 0.7 10*3/uL — ABNORMAL LOW (ref 1.7–7.7)
Neutrophils Relative %: 55 %
Platelets: 152 10*3/uL (ref 150–400)
RBC: 2.43 MIL/uL — ABNORMAL LOW (ref 3.87–5.11)
RDW: 20.3 % — ABNORMAL HIGH (ref 11.5–15.5)
WBC: 1.2 10*3/uL — ABNORMAL LOW (ref 4.0–10.5)
nRBC: 0 % (ref 0.0–0.2)

## 2019-05-11 LAB — COMPREHENSIVE METABOLIC PANEL
ALT: 48 U/L — ABNORMAL HIGH (ref 0–44)
AST: 58 U/L — ABNORMAL HIGH (ref 15–41)
Albumin: 3.8 g/dL (ref 3.5–5.0)
Alkaline Phosphatase: 185 U/L — ABNORMAL HIGH (ref 38–126)
Anion gap: 8 (ref 5–15)
BUN: 23 mg/dL — ABNORMAL HIGH (ref 6–20)
CO2: 29 mmol/L (ref 22–32)
Calcium: 9.5 mg/dL (ref 8.9–10.3)
Chloride: 103 mmol/L (ref 98–111)
Creatinine, Ser: 1.02 mg/dL — ABNORMAL HIGH (ref 0.44–1.00)
GFR calc Af Amer: >60 mL/min (ref >60)
GFR calc non Af Amer: >60 mL/min (ref >60)
Glucose, Bld: 122 mg/dL — ABNORMAL HIGH (ref 70–99)
Potassium: 3.7 mmol/L (ref 3.5–5.1)
Sodium: 140 mmol/L (ref 135–145)
Total Bilirubin: 0.8 mg/dL (ref 0.3–1.2)
Total Protein: 7.2 g/dL (ref 6.5–8.1)

## 2019-05-11 MED ORDER — SODIUM CHLORIDE 0.9 % IV SOLN
Freq: Once | INTRAVENOUS | Status: AC
  Administered 2019-05-11: 13:00:00 via INTRAVENOUS
  Filled 2019-05-11: qty 250

## 2019-05-11 MED ORDER — HEPARIN SOD (PORK) LOCK FLUSH 100 UNIT/ML IV SOLN
500.0000 [IU] | Freq: Once | INTRAVENOUS | Status: AC | PRN
  Administered 2019-05-11: 500 [IU]
  Filled 2019-05-11: qty 5

## 2019-05-11 MED ORDER — SODIUM CHLORIDE 0.9 % IV SOLN
600.0000 mg/m2 | Freq: Once | INTRAVENOUS | Status: DC
  Filled 2019-05-11: qty 24

## 2019-05-11 MED ORDER — SODIUM CHLORIDE 0.9% FLUSH
10.0000 mL | INTRAVENOUS | Status: DC | PRN
  Administered 2019-05-11: 10 mL
  Filled 2019-05-11: qty 10

## 2019-05-11 MED ORDER — LEVOFLOXACIN 500 MG PO TABS: 500.0000 mg | ORAL_TABLET | Freq: Every day | ORAL | 0 refills | Status: DC

## 2019-05-11 MED ORDER — SODIUM CHLORIDE 0.9% FLUSH
10.0000 mL | Freq: Once | INTRAVENOUS | Status: AC
  Administered 2019-05-11: 10 mL
  Filled 2019-05-11: qty 10

## 2019-05-11 MED ORDER — SODIUM CHLORIDE 0.9 % IV SOLN
30.0000 mg/m2 | Freq: Once | INTRAVENOUS | Status: AC
  Administered 2019-05-11: 40 mg via INTRAVENOUS
  Filled 2019-05-11: qty 4

## 2019-05-11 MED ORDER — DEXAMETHASONE SODIUM PHOSPHATE 10 MG/ML IJ SOLN
10.0000 mg | Freq: Once | INTRAMUSCULAR | Status: AC
  Administered 2019-05-11: 10 mg via INTRAVENOUS

## 2019-05-11 MED ORDER — DEXAMETHASONE SODIUM PHOSPHATE 10 MG/ML IJ SOLN
INTRAMUSCULAR | Status: AC
  Filled 2019-05-11: qty 1

## 2019-05-11 MED ORDER — SODIUM CHLORIDE 0.9 % IV SOLN
600.0000 mg/m2 | Freq: Once | INTRAVENOUS | Status: AC
  Administered 2019-05-11: 912 mg via INTRAVENOUS
  Filled 2019-05-11: qty 23.99

## 2019-05-11 NOTE — Patient Instructions (Signed)
[facility] Discharge Instructions for Patients Receiving Chemotherapy  Today you received the following chemotherapy agents Gemzar, Taxotere  To help prevent nausea and vomiting after your treatment, we encourage you to take your nausea medication as directed by your MD   If you develop nausea and vomiting that is not controlled by your nausea medication, call the clinic.   BELOW ARE SYMPTOMS THAT SHOULD BE REPORTED IMMEDIATELY:  *FEVER GREATER THAN 100.5 F  *CHILLS WITH OR WITHOUT FEVER  NAUSEA AND VOMITING THAT IS NOT CONTROLLED WITH YOUR NAUSEA MEDICATION  *UNUSUAL SHORTNESS OF BREATH  *UNUSUAL BRUISING OR BLEEDING  TENDERNESS IN MOUTH AND THROAT WITH OR WITHOUT PRESENCE OF ULCERS  *URINARY PROBLEMS  *BOWEL PROBLEMS  UNUSUAL RASH Items with * indicate a potential emergency and should be followed up as soon as possible.  Feel free to call the clinic should you have any questions or concerns. The clinic phone number is [phone].  Please show the [facility] at check-in to the Emergency Department and triage nurse.  [facility]: [phone]

## 2019-05-11 NOTE — Progress Notes (Signed)
Per Morgan Dawson note from today "She has developed neutropenia, ANC 0.7. she is afebrile, no signs of active/recurrent infection. Morgan Dawson would like to continue treatment if possible. She will proceed with treatment today as planned, with dose reduced gemcitabine. She will stop Xeloda today."

## 2019-05-11 NOTE — Addendum Note (Signed)
Addended by: Alla Feeling on: 05/11/2019 04:25 PM   Modules accepted: Orders

## 2019-05-11 NOTE — Telephone Encounter (Signed)
Scheduled appt per 9/17 los. °

## 2019-05-12 ENCOUNTER — Telehealth: Payer: Self-pay

## 2019-05-12 LAB — CANCER ANTIGEN 19-9: CA 19-9: 53 U/mL — ABNORMAL HIGH (ref 0–35)

## 2019-05-12 NOTE — Telephone Encounter (Signed)
TC to Pt to inform her that injection was approved and to  confirm appointment for Saturday at 1. Pt. Verbalized understanding.

## 2019-05-13 ENCOUNTER — Other Ambulatory Visit: Payer: Self-pay | Admitting: Hematology

## 2019-05-13 ENCOUNTER — Other Ambulatory Visit: Payer: Self-pay

## 2019-05-13 ENCOUNTER — Inpatient Hospital Stay: Payer: 59

## 2019-05-13 VITALS — BP 109/71 | HR 69 | Temp 98.9°F | Resp 17

## 2019-05-13 DIAGNOSIS — C259 Malignant neoplasm of pancreas, unspecified: Secondary | ICD-10-CM

## 2019-05-13 DIAGNOSIS — Z95828 Presence of other vascular implants and grafts: Secondary | ICD-10-CM

## 2019-05-13 DIAGNOSIS — Z5111 Encounter for antineoplastic chemotherapy: Secondary | ICD-10-CM | POA: Diagnosis not present

## 2019-05-13 DIAGNOSIS — D696 Thrombocytopenia, unspecified: Secondary | ICD-10-CM

## 2019-05-13 MED ORDER — FILGRASTIM-SNDZ 300 MCG/0.5ML IJ SOSY
300.0000 ug | PREFILLED_SYRINGE | Freq: Once | INTRAMUSCULAR | Status: AC
  Administered 2019-05-13: 300 ug via SUBCUTANEOUS

## 2019-05-18 ENCOUNTER — Other Ambulatory Visit: Payer: Self-pay

## 2019-05-18 ENCOUNTER — Inpatient Hospital Stay: Payer: 59

## 2019-05-18 DIAGNOSIS — C251 Malignant neoplasm of body of pancreas: Secondary | ICD-10-CM

## 2019-05-18 DIAGNOSIS — Z5111 Encounter for antineoplastic chemotherapy: Secondary | ICD-10-CM | POA: Diagnosis not present

## 2019-05-18 LAB — COMPREHENSIVE METABOLIC PANEL
ALT: 39 U/L (ref 0–44)
AST: 44 U/L — ABNORMAL HIGH (ref 15–41)
Albumin: 4 g/dL (ref 3.5–5.0)
Alkaline Phosphatase: 172 U/L — ABNORMAL HIGH (ref 38–126)
Anion gap: 9 (ref 5–15)
BUN: 22 mg/dL — ABNORMAL HIGH (ref 6–20)
CO2: 29 mmol/L (ref 22–32)
Calcium: 9.4 mg/dL (ref 8.9–10.3)
Chloride: 102 mmol/L (ref 98–111)
Creatinine, Ser: 1.26 mg/dL — ABNORMAL HIGH (ref 0.44–1.00)
GFR calc Af Amer: 55 mL/min — ABNORMAL LOW (ref >60)
GFR calc non Af Amer: 48 mL/min — ABNORMAL LOW (ref >60)
Glucose, Bld: 116 mg/dL — ABNORMAL HIGH (ref 70–99)
Potassium: 3.4 mmol/L — ABNORMAL LOW (ref 3.5–5.1)
Sodium: 140 mmol/L (ref 135–145)
Total Bilirubin: 0.7 mg/dL (ref 0.3–1.2)
Total Protein: 7.6 g/dL (ref 6.5–8.1)

## 2019-05-18 LAB — CBC WITH DIFFERENTIAL/PLATELET
Abs Immature Granulocytes: 0.4 10*3/uL — ABNORMAL HIGH (ref 0.00–0.07)
Basophils Absolute: 0 10*3/uL (ref 0.0–0.1)
Basophils Relative: 1 %
Eosinophils Absolute: 0.1 10*3/uL (ref 0.0–0.5)
Eosinophils Relative: 1 %
HCT: 25.9 % — ABNORMAL LOW (ref 36.0–46.0)
Hemoglobin: 8.6 g/dL — ABNORMAL LOW (ref 12.0–15.0)
Immature Granulocytes: 9 %
Lymphocytes Relative: 14 %
Lymphs Abs: 0.6 10*3/uL — ABNORMAL LOW (ref 0.7–4.0)
MCH: 34.3 pg — ABNORMAL HIGH (ref 26.0–34.0)
MCHC: 33.2 g/dL (ref 30.0–36.0)
MCV: 103.2 fL — ABNORMAL HIGH (ref 80.0–100.0)
Monocytes Absolute: 0.5 10*3/uL (ref 0.1–1.0)
Monocytes Relative: 12 %
Neutro Abs: 2.9 10*3/uL (ref 1.7–7.7)
Neutrophils Relative %: 63 %
Platelets: 52 10*3/uL — ABNORMAL LOW (ref 150–400)
RBC: 2.51 MIL/uL — ABNORMAL LOW (ref 3.87–5.11)
RDW: 21.1 % — ABNORMAL HIGH (ref 11.5–15.5)
WBC: 4.6 10*3/uL (ref 4.0–10.5)
nRBC: 0.4 % — ABNORMAL HIGH (ref 0.0–0.2)

## 2019-05-19 ENCOUNTER — Telehealth: Payer: Self-pay

## 2019-05-19 NOTE — Telephone Encounter (Signed)
Spoke with patient regarding Xeloda, instructed to not start the Xeloda on Sunday, come in for labs on Monday 9/28 at 9:15, we will review them and get back to her on Monday.  Patient verbalized an understanding.

## 2019-05-22 ENCOUNTER — Other Ambulatory Visit: Payer: Self-pay

## 2019-05-22 ENCOUNTER — Telehealth: Payer: Self-pay

## 2019-05-22 ENCOUNTER — Inpatient Hospital Stay: Payer: 59

## 2019-05-22 DIAGNOSIS — Z5111 Encounter for antineoplastic chemotherapy: Secondary | ICD-10-CM | POA: Diagnosis not present

## 2019-05-22 DIAGNOSIS — C251 Malignant neoplasm of body of pancreas: Secondary | ICD-10-CM

## 2019-05-22 LAB — CBC WITH DIFFERENTIAL/PLATELET
Abs Immature Granulocytes: 0.16 10*3/uL — ABNORMAL HIGH (ref 0.00–0.07)
Basophils Absolute: 0 10*3/uL (ref 0.0–0.1)
Basophils Relative: 1 %
Eosinophils Absolute: 0.1 10*3/uL (ref 0.0–0.5)
Eosinophils Relative: 4 %
HCT: 27.7 % — ABNORMAL LOW (ref 36.0–46.0)
Hemoglobin: 9 g/dL — ABNORMAL LOW (ref 12.0–15.0)
Immature Granulocytes: 5 %
Lymphocytes Relative: 11 %
Lymphs Abs: 0.3 10*3/uL — ABNORMAL LOW (ref 0.7–4.0)
MCH: 35 pg — ABNORMAL HIGH (ref 26.0–34.0)
MCHC: 32.5 g/dL (ref 30.0–36.0)
MCV: 107.8 fL — ABNORMAL HIGH (ref 80.0–100.0)
Monocytes Absolute: 0.5 10*3/uL (ref 0.1–1.0)
Monocytes Relative: 16 %
Neutro Abs: 2 10*3/uL (ref 1.7–7.7)
Neutrophils Relative %: 63 %
Platelets: 85 10*3/uL — ABNORMAL LOW (ref 150–400)
RBC: 2.57 MIL/uL — ABNORMAL LOW (ref 3.87–5.11)
RDW: 22.3 % — ABNORMAL HIGH (ref 11.5–15.5)
WBC: 3.2 10*3/uL — ABNORMAL LOW (ref 4.0–10.5)
nRBC: 0 % (ref 0.0–0.2)

## 2019-05-22 LAB — COMPREHENSIVE METABOLIC PANEL
ALT: 34 U/L (ref 0–44)
AST: 44 U/L — ABNORMAL HIGH (ref 15–41)
Albumin: 3.9 g/dL (ref 3.5–5.0)
Alkaline Phosphatase: 148 U/L — ABNORMAL HIGH (ref 38–126)
Anion gap: 9 (ref 5–15)
BUN: 14 mg/dL (ref 6–20)
CO2: 27 mmol/L (ref 22–32)
Calcium: 9.4 mg/dL (ref 8.9–10.3)
Chloride: 104 mmol/L (ref 98–111)
Creatinine, Ser: 1.26 mg/dL — ABNORMAL HIGH (ref 0.44–1.00)
GFR calc Af Amer: 55 mL/min — ABNORMAL LOW (ref >60)
GFR calc non Af Amer: 48 mL/min — ABNORMAL LOW (ref >60)
Glucose, Bld: 150 mg/dL — ABNORMAL HIGH (ref 70–99)
Potassium: 3.4 mmol/L — ABNORMAL LOW (ref 3.5–5.1)
Sodium: 140 mmol/L (ref 135–145)
Total Bilirubin: 0.6 mg/dL (ref 0.3–1.2)
Total Protein: 7.3 g/dL (ref 6.5–8.1)

## 2019-05-22 NOTE — Telephone Encounter (Signed)
Spoke with patient regarding lab results.  Per Dr. Burr Medico platelets have improved slightly but not enough to start Xeloda.  Instructed her to have repeat lab on Friday 10/2 at 9:15 to repeat labs, no chemo this week.  She verbalized an understanding.

## 2019-05-24 ENCOUNTER — Other Ambulatory Visit: Payer: 59

## 2019-05-24 ENCOUNTER — Ambulatory Visit: Payer: 59

## 2019-05-24 ENCOUNTER — Ambulatory Visit: Payer: 59 | Admitting: Medical

## 2019-05-25 ENCOUNTER — Other Ambulatory Visit: Payer: Self-pay | Admitting: Hematology

## 2019-05-25 DIAGNOSIS — C251 Malignant neoplasm of body of pancreas: Secondary | ICD-10-CM

## 2019-05-26 ENCOUNTER — Ambulatory Visit: Payer: 59 | Admitting: Hematology

## 2019-05-26 ENCOUNTER — Other Ambulatory Visit: Payer: Self-pay

## 2019-05-26 ENCOUNTER — Other Ambulatory Visit: Payer: 59

## 2019-05-26 ENCOUNTER — Inpatient Hospital Stay: Payer: 59 | Attending: Hematology

## 2019-05-26 ENCOUNTER — Ambulatory Visit: Payer: 59

## 2019-05-26 DIAGNOSIS — E46 Unspecified protein-calorie malnutrition: Secondary | ICD-10-CM | POA: Diagnosis not present

## 2019-05-26 DIAGNOSIS — G629 Polyneuropathy, unspecified: Secondary | ICD-10-CM | POA: Diagnosis not present

## 2019-05-26 DIAGNOSIS — E876 Hypokalemia: Secondary | ICD-10-CM | POA: Insufficient documentation

## 2019-05-26 DIAGNOSIS — R7401 Elevation of levels of liver transaminase levels: Secondary | ICD-10-CM | POA: Diagnosis not present

## 2019-05-26 DIAGNOSIS — C259 Malignant neoplasm of pancreas, unspecified: Secondary | ICD-10-CM | POA: Insufficient documentation

## 2019-05-26 DIAGNOSIS — Z23 Encounter for immunization: Secondary | ICD-10-CM | POA: Diagnosis not present

## 2019-05-26 DIAGNOSIS — R634 Abnormal weight loss: Secondary | ICD-10-CM | POA: Diagnosis not present

## 2019-05-26 DIAGNOSIS — D61810 Antineoplastic chemotherapy induced pancytopenia: Secondary | ICD-10-CM | POA: Insufficient documentation

## 2019-05-26 DIAGNOSIS — I82409 Acute embolism and thrombosis of unspecified deep veins of unspecified lower extremity: Secondary | ICD-10-CM | POA: Insufficient documentation

## 2019-05-26 DIAGNOSIS — C787 Secondary malignant neoplasm of liver and intrahepatic bile duct: Secondary | ICD-10-CM | POA: Diagnosis present

## 2019-05-26 DIAGNOSIS — T451X5A Adverse effect of antineoplastic and immunosuppressive drugs, initial encounter: Secondary | ICD-10-CM | POA: Diagnosis not present

## 2019-05-26 DIAGNOSIS — C251 Malignant neoplasm of body of pancreas: Secondary | ICD-10-CM

## 2019-05-26 DIAGNOSIS — Z5111 Encounter for antineoplastic chemotherapy: Secondary | ICD-10-CM | POA: Diagnosis present

## 2019-05-26 LAB — CBC WITH DIFFERENTIAL/PLATELET
Abs Immature Granulocytes: 0.02 10*3/uL (ref 0.00–0.07)
Basophils Absolute: 0 10*3/uL (ref 0.0–0.1)
Basophils Relative: 1 %
Eosinophils Absolute: 0.1 10*3/uL (ref 0.0–0.5)
Eosinophils Relative: 3 %
HCT: 28.8 % — ABNORMAL LOW (ref 36.0–46.0)
Hemoglobin: 9.2 g/dL — ABNORMAL LOW (ref 12.0–15.0)
Immature Granulocytes: 1 %
Lymphocytes Relative: 10 %
Lymphs Abs: 0.4 10*3/uL — ABNORMAL LOW (ref 0.7–4.0)
MCH: 34.5 pg — ABNORMAL HIGH (ref 26.0–34.0)
MCHC: 31.9 g/dL (ref 30.0–36.0)
MCV: 107.9 fL — ABNORMAL HIGH (ref 80.0–100.0)
Monocytes Absolute: 0.5 10*3/uL (ref 0.1–1.0)
Monocytes Relative: 13 %
Neutro Abs: 2.7 10*3/uL (ref 1.7–7.7)
Neutrophils Relative %: 72 %
Platelets: 237 10*3/uL (ref 150–400)
RBC: 2.67 MIL/uL — ABNORMAL LOW (ref 3.87–5.11)
RDW: 19.9 % — ABNORMAL HIGH (ref 11.5–15.5)
WBC: 3.8 10*3/uL — ABNORMAL LOW (ref 4.0–10.5)
nRBC: 0 % (ref 0.0–0.2)

## 2019-05-26 LAB — COMPREHENSIVE METABOLIC PANEL
ALT: 34 U/L (ref 0–44)
AST: 45 U/L — ABNORMAL HIGH (ref 15–41)
Albumin: 4 g/dL (ref 3.5–5.0)
Alkaline Phosphatase: 178 U/L — ABNORMAL HIGH (ref 38–126)
Anion gap: 9 (ref 5–15)
BUN: 15 mg/dL (ref 6–20)
CO2: 26 mmol/L (ref 22–32)
Calcium: 9.5 mg/dL (ref 8.9–10.3)
Chloride: 105 mmol/L (ref 98–111)
Creatinine, Ser: 1.06 mg/dL — ABNORMAL HIGH (ref 0.44–1.00)
GFR calc Af Amer: >60 mL/min (ref >60)
GFR calc non Af Amer: 59 mL/min — ABNORMAL LOW (ref >60)
Glucose, Bld: 118 mg/dL — ABNORMAL HIGH (ref 70–99)
Potassium: 3.8 mmol/L (ref 3.5–5.1)
Sodium: 140 mmol/L (ref 135–145)
Total Bilirubin: 0.5 mg/dL (ref 0.3–1.2)
Total Protein: 7.3 g/dL (ref 6.5–8.1)

## 2019-05-26 NOTE — Progress Notes (Signed)
[place]   [phone] [phone]   Clinic Follow up Note   Patient Care Team: [name], MD as PCP - General (Family Medicine) [name], MD as Consulting Physician (Otolaryngology) [name], MD as Consulting Physician (Oncology)  Date of Service:  05/31/2019  CHIEF COMPLAINT: F/u of pancreatic cancer  SUMMARY OF ONCOLOGIC HISTORY: Oncology History Overview Note  Cancer Staging Pancreatic cancer [facility]) Staging form: Exocrine Pancreas, AJCC 8th Edition - Clinical stage from 08/06/2017: Stage IV (cTX, cN0, pM1) - Signed by [name], MD on 08/12/2017     Pancreatic cancer ([place])  07/23/2017 Imaging   US abdomen limited RUQ 07/23/17 IMPRESSION: 1. Cholelithiasis.  No secondary signs of acute cholecystitis. 2. Multiple solid liver masses measuring up to 6.5 cm in the left lobe of the liver, evaluation for metastatic disease is recommended. These results will be called to the ordering clinician or representative by the Radiologist Assistant, and communication documented in the PACS or zVision Dashboard.   07/23/2017 Imaging   CT Abdomen W Contrast 07/23/17 IMPRESSION: 1. Widespread metastatic disease throughout the liver. No clear primary malignancy identified in the abdomen. The pelvis was not imaged. Tissue sampling recommended. 2. Probable adenopathy superior to the pancreatic tail. No evidence of pancreatic mass. 3. Suspected incidental hemangioma inferiorly in the right hepatic lobe. 4. Nonspecific nodularity in the breasts. The patient has undergone recent (03/25/2017 and 04/01/2017) mammography and ultrasound.   07/29/2017 Initial Diagnosis   Metastasis to liver of unknown origin ([place])   07/29/2017 PET scan   PET 07/29/17  IMPRESSION: 1. Numerous bulky liver masses are hypermetabolic compatible with malignancy. 2. Accentuated activity within or along the pancreatic tail, likely represent a primary pancreatic tumor.  Consider pancreatic protocol MRI to further work this up. 3. The peripancreatic lymph node shown above the pancreatic tail is mildly hypermetabolic favoring malignancy. 4.  Prominent stool throughout the colon favors constipation. 5. 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   08/05/2017 Pathology Results   Liver Biopsy  Diagnosis 08/05/17 Liver, needle/core biopsy - CARCINOMA. - SEE COMMENT. Microscopic Comment The malignant cells are positive for cytokeratin 7. They are negative for arginase, CDX2, cytokeratin 20, estrogen receptor, GATA-3, GCDFP, Glypican 3, Hep Par 1, Napsin A, and TTF-1. This immunohistochemical is nonspecific. Possibly primary sources include pancreatobiliary and upper gastrointestinal. Radiologic correlation is necessary. Of note, organ specific markers (GATA-3, GCDFP-breast, TTF-1, Napsin A-lung, and CDX2-colon) are negative. (JBK:ecj 08/09/2017)   08/21/2017 - 02/10/2018 Chemotherapy   FOLFIRINOX every 2 weeks starting 08/21/17. Dose reduced due to neuropahty and cytopenia    10/14/2017 Imaging   CT CAP WO Contrast 10/14/17 IMPRESSION: Evidence of known numerous liver metastases without significant interval change. No other evidence of metastatic disease within the chest, abdomen or pelvis. Cholelithiasis. Tiny pericardial effusion.   12/02/2017 Genetic Testing   Negative for pathogenic mutation.  The genes analyzed were the 83 genes on [facility]'s Multi-Cancer panel (ALK, APC, ATM, AXIN2, BAP1, BARD1, BLM, BMPR1A, BRCA1, BRCA2, BRIP1, CASR, CDC73, CDH1, CDK4, CDKN1B, CDKN1C, CDKN2A, CEBPA, CHEK2, CTNNA1, DICER1, DIS3L2, EGFR, EPCAM, FH, FLCN, GATA2, GPC3, GREM1, HOXB13, HRAS, KIT, MAX, MEN1, MET, MITF, MLH1, MSH2, MSH3, MSH6, MUTYH, NBN, NF1, NF2, NTHL1, PALB2, PDGFRA, PHOX2B, PMS2, POLD1, POLE, POT1, PRKAR1A, PTCH1, PTEN, RAD50, RAD51C, RAD51D, RB1, RECQL4, RET, RUNX1, SDHA, SDHAF2, SDHB, SDHC, SDHD, SMAD4, SMARCA4, SMARCB1, SMARCE1, STK11, SUFU, TERC, TERT,  TMEM127, TP53, TSC1, TSC2, VHL, WRN, WT1).   12/17/2017 PET scan   IMPRESSION: 1. Although the liver metastases  are still visible on the CT images, they are significantly smaller and retain no abnormal metabolic activity, consistent with response to therapy. 2. No abnormal activity within the pancreas. 3. No disease progression identified. 4. Cholelithiasis.   02/21/2018 PET scan   02/21/2018 PET Scan  IMPRESSION: 1. There are 2 new small nodules identified within the right lung which measure up to 5 mm. These are too small to reliably characterize by PET-CT, but warrant close interval follow-up. 2. Again noted are multifocal liver metastasis. The target lesion within the left lobe is slightly decreased in size when compared with the previous exam. Similar to previous exam there is no abnormal hypermetabolism above background liver activity identified within the liver lesions. 3. Gallstones.   02/23/2018 - 09/04/2018 Chemotherapy   5-FU pump infusion and liposomal irinotecan, every 2 weeks.  First cycle dose reduced due to cytopenia in the travel. Stopped on 08/25/2018 due to disease progression.    05/17/2018 Imaging   05/17/2018 PET Scan IMPRESSION: 1. Mixed response. The 2 small right lung nodules have decreased in size in the interval. Single focus of increased uptake within the liver is new from 02/21/2018 and concerning for recurrent metabolically active liver metastasis. 2. Splenomegaly.  New from previous exam. 3. Diffusely increased bone marrow activity, likely reflecting treatment related changes.   09/01/2018 PET scan   PET 09/01/18 IMPRESSION: 1. Unfortunately there recurrence of hepatic metastasis with multiple new hypermetabolic lesions in LEFT and RIGHT hepatic lobe. 2. New extra hepatic site of malignancy which appears associated the mid pancreas. Difficult to define lesion on noncontrast exam. 3. Diffuse marrow activity is favored benign. 4. No evidence of pulmonary  metastasis.   09/12/2018 - 03/27/2019 Chemotherapy   second-line Gemcitabine and Abraxane for 2 weeks on, 1 week off starting 09/12/2018. Due to neuropathy, I will start her with dose reduced Abraxane. Stopped after 03/27/19 due to disease progression.    12/14/2018 Imaging   CT CAP  IMPRESSION: CT demonstrates acute cholecystitis, with choledocholithiasis of the cystic duct.  These results were discussed by telephone at the time of interpretation on 12/14/2018 at 5:05 pm with Dr. Burr Medico.  Redemonstration of known liver metastases, with positive response to therapy, interval reduction in size of all lesions, with multiple demonstrating significant internal necrosis.  No acute finding of the chest.  Ancillary findings as above.   03/21/2019 Imaging   CT CAP W Contrast IMPRESSION: 1. Numerous rim enhancing liver metastases show overall trend towards mild progression although 1 of the index lesions is stable in the interval. 2. Interval development of portocaval lymphadenopathy concerning for disease progression. 3. Slight progression of main duct dilatation in the pancreas with abrupt cut off in the region of the body. 4. Interval development of a 9 mm subpleural nodule at the right base along the posterior hemidiaphragm. Close attention on follow-up recommended as metastatic disease not excluded. 5. New splenomegaly. 6. Cholelithiasis with diffuse gallbladder wall thickening. Gallbladder wall thickening is decreased in the interval. Stones and sludge noted in the gallbladder lumen.   04/09/2019 Imaging   CT AP W Contrast 04/09/19  IMPRESSION: 1. Multiple gallstones in lumen gallbladder without evidence acute cholecystitis. No significant change from prior. No biliary duct dilatation. Common bile duct normal. 2. Widespread hepatic metastasis unchanged from comparison CT. 3. Chronic pancreatic duct dilatation in the body and tail unchanged. 4. No bowel obstruction. 5. Small  amount of intraperitoneal free fluid unchanged.   04/10/2019 -  Chemotherapy   Third-line GTX with Xeloda  1066m BID day 1-14, off for 1 week with Docetaxel and Gemcitbine on day 4 and day 11 every 3 weeks starting oral chemo on 04/10/19.    Panc[place] liver [place]  08/11/2017 Initial Diagnosis   Pancreatic cancer metastasized to liver ([facility]      CURRENT THERAPY:  Third-line GTX with Xeloda 10016mBID day 1-14, off for 1 week with Docetaxel 3047m2 and Gemcitabine 750m100m on day 4 and day 11 every 3 weeks starting oral chemo on 04/10/19.   INTERVAL HISTORY:  Morgan RAWEhere for a follow up and treatment. She presents to the clinic alone. She notes she plans to go Hot air balloon riding for her 29th Wedding anniversary. She denies any overt bleeding but has skin bruising from blood thinner. She feels this current regimen is tolerable. She is taking Xeloda 1000mg58m. She started current cycle on 05/28/19. She feels she is able to manage her daily life with this. She notes her weight is still maintained above 100 pounds. Her current gaol remains 100 pounds. She feels her appetite recovers well after a few days from infusion. She has mild neuropathy which is mildly progressing in her feet.     REVIEW OF SYSTEMS:   Constitutional: Denies fevers, chills or abnormal weight loss  Eyes: Denies blurriness of vision Ears, nose, mouth, throat, and face: Denies mucositis or sore throat Respiratory: Denies cough, dyspnea or wheezes Cardiovascular: Denies palpitation, chest discomfort or lower extremity swelling Gastrointestinal:  Denies nausea, heartburn or change in bowel habits Skin: Denies abnormal skin rashes Lymphatics: Denies new lymphadenopathy or easy bruising Neurological: (+) Neuropathy is feet more than hands Behavioral/Psych: Mood is stable, no new changes  All other systems were reviewed with the patient and are negative.  MEDICAL HISTORY:  Past Medical  History:  Diagnosis Date   Diarrhea of presumed infectious origin 01/12/2018   Gastroenteritis 01/12/2018   Genetic testing 12/02/2017   Multi-Cancer panel (83 genes) @ [facility] - No pathogenic mutations detected   Meniere disease 2016   Pancreatitis     SURGICAL HISTORY: Past Surgical History:  Procedure Laterality Date   CERVICAL ABLATION     ENDOMETRIAL ABLATION  2011   IR CHOLANGIOGRAM EXISTING TUBE  01/09/2019   IR FLUORO GUIDE PORT INSERTION RIGHT  08/12/2017   IR PERC CHOLECYSTOSTOMY  12/15/2018   IR US [place] VASC ACCESS RIGHT  08/12/2017   MANDIBLE SURGERY  1999    I have reviewed the social history and family history with the patient and they are unchanged from previous note.  ALLERGIES:  has No Known Allergies.  MEDICATIONS:  Current Outpatient Medications  Medication Sig Dispense Refill   b complex vitamins tablet Take 1 tablet by mouth daily.     capecitabine (XELODA) 500 MG tablet TAKE 2 TABLETS BY MOUTH  TWICE DAILY WITHIN 30  MINUTES OF MEALS. TAKE ON  DAYS 1 TO 14 OF EACH 21 DAY CYCLE 56 tablet 2   cyclobenzaprine (FLEXERIL) 5 MG tablet Take 1 tablet (5 mg total) by mouth 3 (three) times daily as needed for muscle spasms. 30 tablet 0   gabapentin (NEURONTIN) 100 MG capsule TAKE ONE CAPSULE BY MOUTH THREE TIMES DAILY (Patient taking differently: Take 100 mg by mouth 2 (two) times daily. ) 270 capsule 1   levofloxacin (LEVAQUIN) 500 MG tablet Take 1 tablet (500 mg total) by mouth daily. 7 tablet 0   lidocaine-prilocaine (EMLA) cream Apply 1 application topically as needed. 30 g 2  loperamide (IMODIUM) 2 MG capsule Take 1 capsule (2 mg total) by mouth as needed for diarrhea or loose stools. 30 capsule 0   loratadine (CLARITIN) 10 MG tablet Take 10 mg by mouth daily as needed for allergies.     magic mouthwash w/lidocaine SOLN Take 10 mLs by mouth 3 (three) times daily as needed for mouth pain. Swish and spit 10 ML by mouth 3 times daily as needed for  mouth pain. 240 mL 0   meclizine (ANTIVERT) 25 MG tablet Take 25 mg by mouth 3 (three) times daily as needed for dizziness.     ondansetron (ZOFRAN-ODT) 8 MG disintegrating tablet TAKE 1 TABLET BY MOUTH EVERY 8 HOURS AS NEEDED FOR NAUSE OR VOMITING (Patient taking differently: Take 8 mg by mouth every 8 (eight) hours as needed for nausea or vomiting. ) 40 tablet 0   potassium chloride (K-DUR) 10 MEQ tablet Take 1 tablet (10 mEq total) by mouth 4 (four) times daily. (Patient taking differently: Take 30 mEq by mouth daily. ) 360 tablet 2   prochlorperazine (COMPAZINE) 10 MG tablet Take 1 tablet (10 mg total) by mouth every 6 (six) hours as needed for nausea or vomiting. 40 tablet 2   Rivaroxaban (XARELTO) 15 MG TABS tablet Take 1 tablet (15 mg total) by mouth daily. 90 tablet 1   traMADol (ULTRAM) 50 MG tablet Take 1 tablet (50 mg total) by mouth every 6 (six) hours as needed for moderate pain or severe pain. 30 tablet 0   eltrombopag (PROMACTA) 50 MG tablet Take 1 tablet (50 mg total) by mouth daily. Take on an empty stomach 1 hour before a meal or 2 hours after 30 tablet 0   No current facility-administered medications for this visit.    Facility-Administered Medications Ordered in Other Visits  Medication Dose Route Frequency Provider Last Rate Last Dose   0.9 %  sodium chloride infusion   Intravenous Once [name], MD       DOCEtaxel (TAXOTERE) 40 mg in sodium chloride 0.9 % 150 mL chemo infusion  30 mg/m2 (Treatment Plan Recorded) Intravenous Once [name], MD       gemcitabine (GEMZAR) 912 mg in sodium chloride 0.9 % 250 mL chemo infusion  600 mg/m2 (Treatment Plan Recorded) Intravenous Once [name], MD       heparin lock flush 100 unit/mL  500 Units Intracatheter Once PRN [name], MD       sodium chloride flush (NS) 0.9 % injection 10 mL  10 mL Intracatheter Once [name], MD       sodium chloride flush (NS) 0.9 % injection 10 mL  10 mL Intracatheter PRN [name], MD          PHYSICAL EXAMINATION: ECOG PERFORMANCE STATUS: 1 - Symptomatic but completely ambulatory  Vitals:   05/31/19 1029  BP: 104/72  Pulse: 84  Resp: 17  Temp: 98.7 F (37.1 C)  SpO2: 99%   Filed Weights   05/31/19 1029  Weight: 104 lb 11.2 oz (47.5 kg)    GENERAL:alert, no distress and comfortable SKIN: skin color, texture, turgor are normal, no rashes or significant lesions EYES: normal, Conjunctiva are pink and non-injected, sclera clear  NECK: supple, thyroid normal size, non-tender, without nodularity LYMPH:  no palpable lymphadenopathy in the cervical, axillary  LUNGS: clear to auscultation and percussion with normal breathing effort HEART: regular rate & rhythm and no murmurs and no lower extremity edema ABDOMEN:abdomen soft, non-tender and normal bowel sounds Musculoskeletal:no cyanosis  of digits and no clubbing  NEURO: alert & oriented x 3 with fluent speech, no focal motor/sensory deficits  LABORATORY DATA:  I have reviewed the data as listed CBC Latest Ref Rng & Units 05/31/2019 05/26/2019 05/22/2019  WBC 4.0 - 10.5 K/uL 4.5 3.8(L) 3.2(L)  Hemoglobin 12.0 - 15.0 g/dL 9.6(L) 9.2(L) 9.0(L)  Hematocrit 36.0 - 46.0 % 30.1(L) 28.8(L) 27.7(L)  Platelets 150 - 400 K/uL 381 237 85(L)     CMP Latest Ref Rng & Units 05/31/2019 05/26/2019 05/22/2019  Glucose 70 - 99 mg/dL 123(H) 118(H) 150(H)  BUN 6 - 20 mg/dL 21(H) 15 14  Creatinine 0.44 - 1.00 mg/dL 1.02(H) 1.06(H) 1.26(H)  Sodium 135 - 145 mmol/L 137 140 140  Potassium 3.5 - 5.1 mmol/L 3.7 3.8 3.4(L)  Chloride 98 - 111 mmol/L 102 105 104  CO2 22 - 32 mmol/L 26 26 27   Calcium 8.9 - 10.3 mg/dL 9.4 9.5 9.4  Total Protein 6.5 - 8.1 g/dL 7.6 7.3 7.3  Total Bilirubin 0.3 - 1.2 mg/dL 0.9 0.5 0.6  Alkaline Phos 38 - 126 U/L 229(H) 178(H) 148(H)  AST 15 - 41 U/L 45(H) 45(H) 44(H)  ALT 0 - 44 U/L 28 34 34      RADIOGRAPHIC STUDIES: I have personally reviewed the radiological images as listed and agreed with the findings in  the report. No results found.   ASSESSMENT & PLAN:  Morgan Dawson is a 58 y.o. female with   1. 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 Treated with chemo. -She was treated withfirst linechemoFOLFIRINOX with dose reduction starting in 07/2017. Due to neuropathyand cytopenia, in 02/2018 chemo changed to5-FU pump infusion and liposomal irinotecan every 2 weekswith dose reductiondue to cytopenia.She progressed on this based on 08/2018 PET scan.  -She started second-line Gemcitabine and Abraxane2 weeks on/1 week offon 09/12/18. She tolerateswell with no significant side effects except hair lossandintermittent nocturnalfever afterchemo.  -CT CAP from 03/21/19 shows enhancing liver metastasis trending towards mild disease progression. There is interval development of portocaval lymphadenopathy concerning for disease progression. There is also development of 62m subpleural nodule and new splenomegaly.  -I discussed with a start of disease progression I recommend to move forward with change in treatment. She is agreeable.  -I changed her to third-line GTX with Xeloda 10037mBID 2 weeks on/1 week off and Taxol and Gemcitabine on day 4 and 11 q3weeks starting 04/10/19.  -She is tolerating well. Her neuropathy has only mildly progressed in her feet. She is able to maintain weight and her daily activities.  -Labs reviewed, CBC and CMP WNL except Hg 9.6, Cr 1.02, AST 45. Overall adequate to proceed with Taxol and Gemcitabine today with Gemcitabine dose reduction. If not enough for her thrombocytopenia, will consider reducing Xeloda.  -For her vacation she will hold treatment next week and restart in 2 weeks.  -Plan for next scan in 06/2019.  -F/u in 2 weeks   2. Intermittent fever spikes, abdominal pain -she presented to ED for this on 8/16. CT showed stable disease. Work up essentially negative -fever spikes could be related to  tumor, chemo, or infection although no evidence of that on CT -continue tramadol PRN for pain, has not needed recently  3. Weight lossandmalnutrition -Due to chemo and underlying metastatic cancer. -Continue nutritional supplement and f/u with dietician. -Appetite and eating much improved.Weight continues to be maintained above 100 pounds. Her goal is 110.   4. Transaminitis -Due to  underlying chemo and malignancy -AST45, ALT 28, Alk Phos 229today (10/7//20)  5. Pancytopenia, secondary to chemo -Required chemo dose reduction. -S/p blood transfusion as needed with gh <8. Last on 09/20/18 -On Promacta, will continue. Due to worsening thrombocytopenia from chemo, will increase to 33m daily starting 05/31/19.  -WBCand PLTnormal,Hg at9.6(05/31/19)  6. Hypokalemia, CKD Stage III -Sheis currently on KCL supplements -overall stable   7.Peripheral neuropathy, grade 2 -Currently on Neurontin 200 mg at night and B complexes. Continue. -She agreed to use ice bags with Abraxane infusion, continue -Mild and stable in hands and has mildly progressed in feet with current chemo. Will continue to monitor.   8.H/o Acute cholecystitis, status post cholecystectomy tube placement 4/23per IR -she was seen by surgery during herhospitalization, surgery was not recommended due to high risk of complication, and concerns for cancer progression when holding chemotherapy for surgery -Drain intact,currently has decreased to 277mslightlybloody drainage per day -afebrileand no chills. -Her CT CAP from 12/14/18 shows she has numerus gallstones. Will monitor for any inflammation. -Imaging in 12/2018 consistent with cholecystitis with cystic duct obstruction. No leak on HIDA. Holding intervention for now given she is asymptomatic and invasive procedure would delay chemo.  9. LE DVT -Diagnosed on December 15, 2018, when she was hospitalized for cholecystitis -Due to her bloody biliary  drainage, and moderate thrombocytopenia from chemotherapy, she is on low-dose Xarelto1562maily (no loading dose), she is tolerating well and will continue  -Refilled with 90 day prescription today (03/27/19). No obvious signs of bleeding, only mild bruising of legs.    10.Goal of care discussion -She is full code now -She understands her cancer is incurable at this stage, and the goal of therapy is palliative, to prolong his life, and prevent cancer related symptoms   11. Vertigo  -continue Meclazine as needed -Will monitor. If this worsens I recommend she see ENT.   Plan -I refilled and increased Promacta to 82m70mily. I refilled EMLA cream  -Labs reviewed and adequate to proceed with Gemcitabine at lower dose 600mg71mand Docetaxel same dose today  -She started current cycle Xeloda on 10/4 and will continue for 7 days, restart next cycle on 10/18 -Due to her vacation trip, she will be off treatment next week and will restart next cycle on 10/18 -f/u on 10/21 before iv chemo  -will order restaging scan on next visit    No problem-specific Assessment & Plan notes found for this encounter.   No orders of the defined types were placed in this encounter.  All questions were answered. The patient knows to call the clinic with any problems, questions or concerns. No barriers to learning was detected. I spent 20 minutes counseling the patient face to face. The total time spent in the appointment was 25 minutes and more than 50% was on counseling and review of test results     Harlean Regula FTruitt Merle10/02/2019   I, AmoyaJoslyn Devonacting as scribe for Cola Gane FTruitt Merle   I have reviewed the above documentation for accuracy and completeness, and I agree with the above.

## 2019-05-29 ENCOUNTER — Telehealth: Payer: Self-pay | Admitting: Hematology

## 2019-05-29 NOTE — Telephone Encounter (Signed)
Scheduled appt per 10/5 sch message - pt to get an updated schedule next visit.

## 2019-05-31 ENCOUNTER — Other Ambulatory Visit: Payer: Self-pay

## 2019-05-31 ENCOUNTER — Inpatient Hospital Stay: Payer: 59

## 2019-05-31 ENCOUNTER — Inpatient Hospital Stay (HOSPITAL_BASED_OUTPATIENT_CLINIC_OR_DEPARTMENT_OTHER): Payer: 59 | Admitting: Hematology

## 2019-05-31 ENCOUNTER — Encounter: Payer: Self-pay | Admitting: Hematology

## 2019-05-31 VITALS — BP 104/72 | HR 84 | Temp 98.7°F | Resp 17 | Ht 65.0 in | Wt 104.7 lb

## 2019-05-31 DIAGNOSIS — C251 Malignant neoplasm of body of pancreas: Secondary | ICD-10-CM

## 2019-05-31 DIAGNOSIS — C259 Malignant neoplasm of pancreas, unspecified: Secondary | ICD-10-CM

## 2019-05-31 DIAGNOSIS — T451X5A Adverse effect of antineoplastic and immunosuppressive drugs, initial encounter: Secondary | ICD-10-CM

## 2019-05-31 DIAGNOSIS — D6481 Anemia due to antineoplastic chemotherapy: Secondary | ICD-10-CM

## 2019-05-31 DIAGNOSIS — Z5111 Encounter for antineoplastic chemotherapy: Secondary | ICD-10-CM | POA: Diagnosis not present

## 2019-05-31 DIAGNOSIS — Z7189 Other specified counseling: Secondary | ICD-10-CM

## 2019-05-31 DIAGNOSIS — Z95828 Presence of other vascular implants and grafts: Secondary | ICD-10-CM

## 2019-05-31 LAB — CBC WITH DIFFERENTIAL/PLATELET
Abs Immature Granulocytes: 0.04 10*3/uL (ref 0.00–0.07)
Basophils Absolute: 0.1 10*3/uL (ref 0.0–0.1)
Basophils Relative: 2 %
Eosinophils Absolute: 0.1 10*3/uL (ref 0.0–0.5)
Eosinophils Relative: 2 %
HCT: 30.1 % — ABNORMAL LOW (ref 36.0–46.0)
Hemoglobin: 9.6 g/dL — ABNORMAL LOW (ref 12.0–15.0)
Immature Granulocytes: 1 %
Lymphocytes Relative: 9 %
Lymphs Abs: 0.4 10*3/uL — ABNORMAL LOW (ref 0.7–4.0)
MCH: 32.8 pg (ref 26.0–34.0)
MCHC: 31.9 g/dL (ref 30.0–36.0)
MCV: 102.7 fL — ABNORMAL HIGH (ref 80.0–100.0)
Monocytes Absolute: 1 10*3/uL (ref 0.1–1.0)
Monocytes Relative: 21 %
Neutro Abs: 2.9 10*3/uL (ref 1.7–7.7)
Neutrophils Relative %: 65 %
Platelets: 381 10*3/uL (ref 150–400)
RBC: 2.93 MIL/uL — ABNORMAL LOW (ref 3.87–5.11)
RDW: 18.1 % — ABNORMAL HIGH (ref 11.5–15.5)
WBC: 4.5 10*3/uL (ref 4.0–10.5)
nRBC: 0 % (ref 0.0–0.2)

## 2019-05-31 LAB — COMPREHENSIVE METABOLIC PANEL
ALT: 28 U/L (ref 0–44)
AST: 45 U/L — ABNORMAL HIGH (ref 15–41)
Albumin: 3.8 g/dL (ref 3.5–5.0)
Alkaline Phosphatase: 229 U/L — ABNORMAL HIGH (ref 38–126)
Anion gap: 9 (ref 5–15)
BUN: 21 mg/dL — ABNORMAL HIGH (ref 6–20)
CO2: 26 mmol/L (ref 22–32)
Calcium: 9.4 mg/dL (ref 8.9–10.3)
Chloride: 102 mmol/L (ref 98–111)
Creatinine, Ser: 1.02 mg/dL — ABNORMAL HIGH (ref 0.44–1.00)
GFR calc Af Amer: >60 mL/min (ref >60)
GFR calc non Af Amer: >60 mL/min (ref >60)
Glucose, Bld: 123 mg/dL — ABNORMAL HIGH (ref 70–99)
Potassium: 3.7 mmol/L (ref 3.5–5.1)
Sodium: 137 mmol/L (ref 135–145)
Total Bilirubin: 0.9 mg/dL (ref 0.3–1.2)
Total Protein: 7.6 g/dL (ref 6.5–8.1)

## 2019-05-31 MED ORDER — SODIUM CHLORIDE 0.9 % IV SOLN
30.0000 mg/m2 | Freq: Once | INTRAVENOUS | Status: AC
  Administered 2019-05-31: 40 mg via INTRAVENOUS
  Filled 2019-05-31: qty 4

## 2019-05-31 MED ORDER — DEXAMETHASONE SODIUM PHOSPHATE 10 MG/ML IJ SOLN
INTRAMUSCULAR | Status: AC
  Filled 2019-05-31: qty 1

## 2019-05-31 MED ORDER — ELTROMBOPAG OLAMINE 50 MG PO TABS: 50.0000 mg | ORAL_TABLET | Freq: Every day | ORAL | 0 refills | Status: DC

## 2019-05-31 MED ORDER — LIDOCAINE-PRILOCAINE 2.5-2.5 % EX CREA: 1.0000 "application " | TOPICAL_CREAM | CUTANEOUS | 2 refills | Status: DC | PRN

## 2019-05-31 MED ORDER — SODIUM CHLORIDE 0.9% FLUSH
10.0000 mL | INTRAVENOUS | Status: DC | PRN
  Administered 2019-05-31: 15:00:00 10 mL
  Filled 2019-05-31: qty 10

## 2019-05-31 MED ORDER — SODIUM CHLORIDE 0.9 % IV SOLN
600.0000 mg/m2 | Freq: Once | INTRAVENOUS | Status: AC
  Administered 2019-05-31: 912 mg via INTRAVENOUS
  Filled 2019-05-31: qty 23.99

## 2019-05-31 MED ORDER — SODIUM CHLORIDE 0.9% FLUSH
10.0000 mL | Freq: Once | INTRAVENOUS | Status: AC
  Administered 2019-05-31: 10 mL
  Filled 2019-05-31: qty 10

## 2019-05-31 MED ORDER — DEXAMETHASONE SODIUM PHOSPHATE 10 MG/ML IJ SOLN
10.0000 mg | Freq: Once | INTRAMUSCULAR | Status: AC
  Administered 2019-05-31: 10 mg via INTRAVENOUS

## 2019-05-31 MED ORDER — SODIUM CHLORIDE 0.9 % IV SOLN
Freq: Once | INTRAVENOUS | Status: AC
  Administered 2019-05-31: 12:00:00 via INTRAVENOUS
  Filled 2019-05-31: qty 250

## 2019-05-31 MED ORDER — HEPARIN SOD (PORK) LOCK FLUSH 100 UNIT/ML IV SOLN
500.0000 [IU] | Freq: Once | INTRAVENOUS | Status: AC | PRN
  Administered 2019-05-31: 500 [IU]
  Filled 2019-05-31: qty 5

## 2019-05-31 NOTE — Patient Instructions (Signed)
[place] Discharge Instructions for Patients Receiving Chemotherapy  Today you received the following chemotherapy agents Gemzar, Taxotere  To help prevent nausea and vomiting after your treatment, we encourage you to take your nausea medication as directed by your MD   If you develop nausea and vomiting that is not controlled by your nausea medication, call the clinic.   BELOW ARE SYMPTOMS THAT SHOULD BE REPORTED IMMEDIATELY:  *FEVER GREATER THAN 100.5 F  *CHILLS WITH OR WITHOUT FEVER  NAUSEA AND VOMITING THAT IS NOT CONTROLLED WITH YOUR NAUSEA MEDICATION  *UNUSUAL SHORTNESS OF BREATH  *UNUSUAL BRUISING OR BLEEDING  TENDERNESS IN MOUTH AND THROAT WITH OR WITHOUT PRESENCE OF ULCERS  *URINARY PROBLEMS  *BOWEL PROBLEMS  UNUSUAL RASH Items with * indicate a potential emergency and should be followed up as soon as possible.  Feel free to call the clinic should you have any questions or concerns. The clinic phone number is [phone].  Please show the [place] at check-in to the Emergency Department and triage nurse.  [facility]: [phone]

## 2019-06-01 ENCOUNTER — Other Ambulatory Visit: Payer: 59

## 2019-06-01 ENCOUNTER — Ambulatory Visit: Payer: 59

## 2019-06-01 ENCOUNTER — Telehealth: Payer: Self-pay | Admitting: Hematology

## 2019-06-01 ENCOUNTER — Ambulatory Visit: Payer: 59 | Admitting: Hematology

## 2019-06-01 NOTE — Telephone Encounter (Signed)
Scheduled appt per 10/7 los.  Was not able to move 10/22 appt to 10/21 due to the Md availability and the pt is aware.  Moved 10/29 appt to 10/28 per the los.  Spoke with pt and she is aware of the new appt date and time.

## 2019-06-06 ENCOUNTER — Ambulatory Visit: Payer: 59 | Admitting: Nurse Practitioner

## 2019-06-06 ENCOUNTER — Other Ambulatory Visit: Payer: 59

## 2019-06-06 ENCOUNTER — Ambulatory Visit: Payer: 59

## 2019-06-15 ENCOUNTER — Other Ambulatory Visit: Payer: Self-pay | Admitting: Hematology

## 2019-06-15 ENCOUNTER — Inpatient Hospital Stay: Payer: 59

## 2019-06-15 ENCOUNTER — Inpatient Hospital Stay (HOSPITAL_BASED_OUTPATIENT_CLINIC_OR_DEPARTMENT_OTHER): Payer: 59 | Admitting: Nurse Practitioner

## 2019-06-15 ENCOUNTER — Other Ambulatory Visit: Payer: Self-pay

## 2019-06-15 ENCOUNTER — Encounter: Payer: Self-pay | Admitting: Nurse Practitioner

## 2019-06-15 VITALS — BP 110/73 | HR 82 | Temp 98.3°F | Resp 17 | Ht 65.0 in | Wt 106.6 lb

## 2019-06-15 DIAGNOSIS — C251 Malignant neoplasm of body of pancreas: Secondary | ICD-10-CM

## 2019-06-15 DIAGNOSIS — Z0000 Encounter for general adult medical examination without abnormal findings: Secondary | ICD-10-CM

## 2019-06-15 DIAGNOSIS — C259 Malignant neoplasm of pancreas, unspecified: Secondary | ICD-10-CM | POA: Diagnosis not present

## 2019-06-15 DIAGNOSIS — Z5111 Encounter for antineoplastic chemotherapy: Secondary | ICD-10-CM | POA: Diagnosis not present

## 2019-06-15 DIAGNOSIS — D490 Neoplasm of unspecified behavior of digestive system: Secondary | ICD-10-CM

## 2019-06-15 DIAGNOSIS — Z7189 Other specified counseling: Secondary | ICD-10-CM

## 2019-06-15 DIAGNOSIS — C787 Secondary malignant neoplasm of liver and intrahepatic bile duct: Secondary | ICD-10-CM

## 2019-06-15 DIAGNOSIS — Z95828 Presence of other vascular implants and grafts: Secondary | ICD-10-CM

## 2019-06-15 LAB — COMPREHENSIVE METABOLIC PANEL
ALT: 31 U/L (ref 0–44)
AST: 42 U/L — ABNORMAL HIGH (ref 15–41)
Albumin: 3.6 g/dL (ref 3.5–5.0)
Alkaline Phosphatase: 250 U/L — ABNORMAL HIGH (ref 38–126)
Anion gap: 9 (ref 5–15)
BUN: 20 mg/dL (ref 6–20)
CO2: 26 mmol/L (ref 22–32)
Calcium: 9.6 mg/dL (ref 8.9–10.3)
Chloride: 104 mmol/L (ref 98–111)
Creatinine, Ser: 1.08 mg/dL — ABNORMAL HIGH (ref 0.44–1.00)
GFR calc Af Amer: >60 mL/min (ref >60)
GFR calc non Af Amer: 57 mL/min — ABNORMAL LOW (ref >60)
Glucose, Bld: 115 mg/dL — ABNORMAL HIGH (ref 70–99)
Potassium: 3.7 mmol/L (ref 3.5–5.1)
Sodium: 139 mmol/L (ref 135–145)
Total Bilirubin: 0.6 mg/dL (ref 0.3–1.2)
Total Protein: 7.6 g/dL (ref 6.5–8.1)

## 2019-06-15 LAB — CBC WITH DIFFERENTIAL/PLATELET
Abs Immature Granulocytes: 0.01 10*3/uL (ref 0.00–0.07)
Basophils Absolute: 0 10*3/uL (ref 0.0–0.1)
Basophils Relative: 1 %
Eosinophils Absolute: 0.1 10*3/uL (ref 0.0–0.5)
Eosinophils Relative: 2 %
HCT: 29.7 % — ABNORMAL LOW (ref 36.0–46.0)
Hemoglobin: 9.5 g/dL — ABNORMAL LOW (ref 12.0–15.0)
Immature Granulocytes: 0 %
Lymphocytes Relative: 10 %
Lymphs Abs: 0.4 10*3/uL — ABNORMAL LOW (ref 0.7–4.0)
MCH: 33.1 pg (ref 26.0–34.0)
MCHC: 32 g/dL (ref 30.0–36.0)
MCV: 103.5 fL — ABNORMAL HIGH (ref 80.0–100.0)
Monocytes Absolute: 0.6 10*3/uL (ref 0.1–1.0)
Monocytes Relative: 13 %
Neutro Abs: 3.1 10*3/uL (ref 1.7–7.7)
Neutrophils Relative %: 74 %
Platelets: 197 10*3/uL (ref 150–400)
RBC: 2.87 MIL/uL — ABNORMAL LOW (ref 3.87–5.11)
RDW: 17.9 % — ABNORMAL HIGH (ref 11.5–15.5)
WBC: 4.2 10*3/uL (ref 4.0–10.5)
nRBC: 0 % (ref 0.0–0.2)

## 2019-06-15 MED ORDER — DEXAMETHASONE SODIUM PHOSPHATE 10 MG/ML IJ SOLN
10.0000 mg | Freq: Once | INTRAMUSCULAR | Status: AC
  Administered 2019-06-15: 10 mg via INTRAVENOUS

## 2019-06-15 MED ORDER — DEXAMETHASONE SODIUM PHOSPHATE 10 MG/ML IJ SOLN
INTRAMUSCULAR | Status: AC
  Filled 2019-06-15: qty 1

## 2019-06-15 MED ORDER — INFLUENZA VAC SPLIT QUAD 0.5 ML IM SUSY
0.5000 mL | PREFILLED_SYRINGE | Freq: Once | INTRAMUSCULAR | Status: AC
  Administered 2019-06-15: 0.5 mL via INTRAMUSCULAR

## 2019-06-15 MED ORDER — SODIUM CHLORIDE 0.9% FLUSH
10.0000 mL | Freq: Once | INTRAVENOUS | Status: AC
  Administered 2019-06-15: 10 mL
  Filled 2019-06-15: qty 10

## 2019-06-15 MED ORDER — SODIUM CHLORIDE 0.9 % IV SOLN
600.0000 mg/m2 | Freq: Once | INTRAVENOUS | Status: AC
  Administered 2019-06-15: 12:00:00 912 mg via INTRAVENOUS
  Filled 2019-06-15: qty 23.99

## 2019-06-15 MED ORDER — SODIUM CHLORIDE 0.9% FLUSH
10.0000 mL | INTRAVENOUS | Status: DC | PRN
  Administered 2019-06-15: 10 mL
  Filled 2019-06-15: qty 10

## 2019-06-15 MED ORDER — SODIUM CHLORIDE 0.9 % IV SOLN
Freq: Once | INTRAVENOUS | Status: AC
  Administered 2019-06-15: 11:00:00 via INTRAVENOUS
  Filled 2019-06-15: qty 250

## 2019-06-15 MED ORDER — HEPARIN SOD (PORK) LOCK FLUSH 100 UNIT/ML IV SOLN
500.0000 [IU] | Freq: Once | INTRAVENOUS | Status: AC | PRN
  Administered 2019-06-15: 500 [IU]
  Filled 2019-06-15: qty 5

## 2019-06-15 MED ORDER — SODIUM CHLORIDE 0.9 % IV SOLN
30.0000 mg/m2 | Freq: Once | INTRAVENOUS | Status: AC
  Administered 2019-06-15: 40 mg via INTRAVENOUS
  Filled 2019-06-15: qty 4

## 2019-06-15 MED ORDER — INFLUENZA VAC SPLIT QUAD 0.5 ML IM SUSY
PREFILLED_SYRINGE | INTRAMUSCULAR | Status: AC
  Filled 2019-06-15: qty 0.5

## 2019-06-15 NOTE — Patient Instructions (Signed)
[place] Discharge Instructions for Patients Receiving Chemotherapy  Today you received the following chemotherapy agents Gemzar, Taxotere  To help prevent nausea and vomiting after your treatment, we encourage you to take your nausea medication as directed by your MD   If you develop nausea and vomiting that is not controlled by your nausea medication, call the clinic.   BELOW ARE SYMPTOMS THAT SHOULD BE REPORTED IMMEDIATELY:  *FEVER GREATER THAN 100.5 F  *CHILLS WITH OR WITHOUT FEVER  NAUSEA AND VOMITING THAT IS NOT CONTROLLED WITH YOUR NAUSEA MEDICATION  *UNUSUAL SHORTNESS OF BREATH  *UNUSUAL BRUISING OR BLEEDING  TENDERNESS IN MOUTH AND THROAT WITH OR WITHOUT PRESENCE OF ULCERS  *URINARY PROBLEMS  *BOWEL PROBLEMS  UNUSUAL RASH Items with * indicate a potential emergency and should be followed up as soon as possible.  Feel free to call the clinic should you have any questions or concerns. The clinic phone number is [phone].  Please show the [place] at check-in to the Emergency Department and triage nurse.  [facility]: [phone]

## 2019-06-15 NOTE — Progress Notes (Signed)
[facility]   [phone] [phone]   Clinic Follow up Note   Patient Care Team: [name], MD as PCP - General (Family Medicine) [name], MD as Consulting Physician (Otolaryngology) [name], MD as Consulting Physician (Oncology) 06/15/2019  CHIEF COMPLAINT: F/u pancreas cancer   SUMMARY OF ONCOLOGIC HISTORY: Oncology History Overview Note  Cancer Staging Pancreatic cancer [facility]) Staging form: Exocrine Pancreas, AJCC 8th Edition - Clinical stage from 08/06/2017: Stage IV (cTX, cN0, pM1) - Signed by [name], MD on 08/12/2017     Pancreatic cancer ([place])  07/23/2017 Imaging   US abdomen limited RUQ 07/23/17 IMPRESSION: 1. Cholelithiasis.  No secondary signs of acute cholecystitis. 2. Multiple solid liver masses measuring up to 6.5 cm in the left lobe of the liver, evaluation for metastatic disease is recommended. These results will be called to the ordering clinician or representative by the Radiologist Assistant, and communication documented in the PACS or zVision Dashboard.   07/23/2017 Imaging   CT Abdomen W Contrast 07/23/17 IMPRESSION: 1. Widespread metastatic disease throughout the liver. No clear primary malignancy identified in the abdomen. The pelvis was not imaged. Tissue sampling recommended. 2. Probable adenopathy superior to the pancreatic tail. No evidence of pancreatic mass. 3. Suspected incidental hemangioma inferiorly in the right hepatic lobe. 4. Nonspecific nodularity in the breasts. The patient has undergone recent (03/25/2017 and 04/01/2017) mammography and ultrasound.   07/29/2017 Initial Diagnosis   Metastasis to liver of unknown origin ([place])   07/29/2017 PET scan   PET 07/29/17  IMPRESSION: 1. Numerous bulky liver masses are hypermetabolic compatible with malignancy. 2. Accentuated activity within or along the pancreatic tail, likely represent a primary pancreatic tumor. Consider pancreatic  protocol MRI to further work this up. 3. The peripancreatic lymph node shown above the pancreatic tail is mildly hypermetabolic favoring malignancy. 4.  Prominent stool throughout the colon favors constipation. 5. 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   08/05/2017 Pathology Results   Liver Biopsy  Diagnosis 08/05/17 Liver, needle/core biopsy - CARCINOMA. - SEE COMMENT. Microscopic Comment The malignant cells are positive for cytokeratin 7. They are negative for arginase, CDX2, cytokeratin 20, estrogen receptor, GATA-3, GCDFP, Glypican 3, Hep Par 1, Napsin A, and TTF-1. This immunohistochemical is nonspecific. Possibly primary sources include pancreatobiliary and upper gastrointestinal. Radiologic correlation is necessary. Of note, organ specific markers (GATA-3, GCDFP-breast, TTF-1, Napsin A-lung, and CDX2-colon) are negative. (JBK:ecj 08/09/2017)   08/21/2017 - 02/10/2018 Chemotherapy   FOLFIRINOX every 2 weeks starting 08/21/17. Dose reduced due to neuropahty and cytopenia    10/14/2017 Imaging   CT CAP WO Contrast 10/14/17 IMPRESSION: Evidence of known numerous liver metastases without significant interval change. No other evidence of metastatic disease within the chest, abdomen or pelvis. Cholelithiasis. Tiny pericardial effusion.   12/02/2017 Genetic Testing   Negative for pathogenic mutation.  The genes analyzed were the 83 genes on [facility]'s Multi-Cancer panel (ALK, APC, ATM, AXIN2, BAP1, BARD1, BLM, BMPR1A, BRCA1, BRCA2, BRIP1, CASR, CDC73, CDH1, CDK4, CDKN1B, CDKN1C, CDKN2A, CEBPA, CHEK2, CTNNA1, DICER1, DIS3L2, EGFR, EPCAM, FH, FLCN, GATA2, GPC3, GREM1, HOXB13, HRAS, KIT, MAX, MEN1, MET, MITF, MLH1, MSH2, MSH3, MSH6, MUTYH, NBN, NF1, NF2, NTHL1, PALB2, PDGFRA, PHOX2B, PMS2, POLD1, POLE, POT1, PRKAR1A, PTCH1, PTEN, RAD50, RAD51C, RAD51D, RB1, RECQL4, RET, RUNX1, SDHA, SDHAF2, SDHB, SDHC, SDHD, SMAD4, SMARCA4, SMARCB1, SMARCE1, STK11, SUFU, TERC, TERT, TMEM127, TP53, TSC1, TSC2,  VHL, WRN, WT1).   12/17/2017 PET scan   IMPRESSION: 1. Although the liver metastases are still visible on the  CT images, °they are significantly smaller and retain no abnormal metabolic °activity, consistent with response to therapy. °2. No abnormal activity within the pancreas. °3. No disease progression identified. °4. Cholelithiasis. °  °02/21/2018 PET scan  ° 02/21/2018 PET Scan  °IMPRESSION: °1. There are 2 new small nodules identified within the right lung °which measure up to 5 mm. These are too small to reliably °characterize by PET-CT, but warrant close interval follow-up. °2. Again noted are multifocal liver metastasis. The target lesion °within the left lobe is slightly decreased in size when compared °with the previous exam. Similar to previous exam there is no °abnormal hypermetabolism above background liver activity identified °within the liver lesions. °3. Gallstones. °  °02/23/2018 - 09/04/2018 Chemotherapy  ° 5-FU pump infusion and liposomal irinotecan, every 2 weeks.  First cycle dose reduced due to cytopenia in the travel. Stopped on 08/25/2018 due to disease progression.  °  °05/17/2018 Imaging  ° 05/17/2018 PET Scan °IMPRESSION: °1. Mixed response. The 2 small right lung nodules have decreased in °size in the interval. Single focus of increased uptake within the °liver is new from 02/21/2018 and concerning for recurrent °metabolically active liver metastasis. °2. Splenomegaly.  New from previous exam. °3. Diffusely increased bone marrow activity, likely reflecting °treatment related changes. °  °09/01/2018 PET scan  ° PET 09/01/18 °IMPRESSION: °1. Unfortunately there recurrence of hepatic metastasis with °multiple new hypermetabolic lesions in LEFT and RIGHT hepatic lobe. °2. New extra hepatic site of malignancy which appears associated the °mid pancreas. Difficult to define lesion on noncontrast exam. °3. Diffuse marrow activity is favored benign. °4. No evidence of pulmonary metastasis. °  °09/12/2018 -  03/27/2019 Chemotherapy  ° second-line Gemcitabine and Abraxane for 2 weeks on, 1 week off starting 09/12/2018. Due to neuropathy, I will start her with dose reduced Abraxane. Stopped after 03/27/19 due to disease progression.  °  °12/14/2018 Imaging  ° CT CAP  °IMPRESSION: °CT demonstrates acute cholecystitis, with choledocholithiasis of the °cystic duct. °  °These results were discussed by telephone at the time of °interpretation on 12/14/2018 at 5:05 pm with Dr. Feng. °  °Redemonstration of known liver metastases, with positive response to °therapy, interval reduction in size of all lesions, with multiple °demonstrating significant internal necrosis. °  °No acute finding of the chest. °  °Ancillary findings as above. °  °03/21/2019 Imaging  ° CT CAP W Contrast °IMPRESSION: °1. Numerous rim enhancing liver metastases show overall trend °towards mild progression although 1 of the index lesions is stable °in the interval. °2. Interval development of portocaval lymphadenopathy concerning for °disease progression. °3. Slight progression of main duct dilatation in the pancreas with °abrupt cut off in the region of the body. °4. Interval development of a 9 mm subpleural nodule at the right °base along the posterior hemidiaphragm. Close attention on follow-up °recommended as metastatic disease not excluded. °5. New splenomegaly. °6. Cholelithiasis with diffuse gallbladder wall thickening. °Gallbladder wall thickening is decreased in the interval. Stones and °sludge noted in the gallbladder lumen. °  °04/09/2019 Imaging  ° CT AP W Contrast 04/09/19  °IMPRESSION: °1. Multiple gallstones in lumen gallbladder without evidence acute °cholecystitis. No significant change from prior. No biliary duct °dilatation. Common bile duct normal. °2. Widespread hepatic metastasis unchanged from comparison CT. °3. Chronic pancreatic duct dilatation in the body and tail °unchanged. °4. No bowel obstruction. °5. Small amount of intraperitoneal free  fluid unchanged. °  °04/10/2019 -  Chemotherapy  ° Third-line GTX with Xeloda 1000mg BID day 1-14, off   for 1 week with Docetaxel and Gemcitbine on day 4 and day 11 every 3 weeks starting oral chemo on 04/10/19.    Pancreatic cancer metastasized to liver ([place])  08/11/2017 Initial Diagnosis   Pancreatic cancer metastasized to liver [facility])     CURRENT THERAPY: Third-line GTX with Xeloda'1000mg'$ BID day 1-14, off for 1 week with Docetaxel'30mg'$ /m2and Gemcitabine'750mg'$ /m2on day 4 and day 11 every 3 weeks starting oral chemo on 04/10/19.  INTERVAL HISTORY: Ms. Shindler returns for f/u and treatment as scheduled. She completed cycle 3 day 4 GTX on 05/31/19. Over the next 48 hours she had LUQ pain requiring q6 medication. She had Tmax up to 100.6. Fever and pain resolved by 2 days later. She had a chemo break on day 8 for her anniversary trip which she enjoyed and felt well. No recurrent fever, chills, or pain. Bowels normal for her. No n/v. No bleeding. Denies mucositis. Her thumbs were peeling for 4 days during last cycle, not significantly sensitive or red. This resolved with lubrication.    MEDICAL HISTORY:  Past Medical History:  Diagnosis Date   Diarrhea of presumed infectious origin 01/12/2018   Gastroenteritis 01/12/2018   Genetic testing 12/02/2017   Multi-Cancer panel (83 genes) @ [facility] - No pathogenic mutations detected   Meniere disease 2016   Pancreatitis     SURGICAL HISTORY: Past Surgical History:  Procedure Laterality Date   CERVICAL ABLATION     ENDOMETRIAL ABLATION  2011   IR CHOLANGIOGRAM EXISTING TUBE  01/09/2019   IR FLUORO GUIDE PORT INSERTION RIGHT  08/12/2017   IR PERC CHOLECYSTOSTOMY  12/15/2018   IR US GUIDE VASC ACCESS RIGHT  08/12/2017   MANDIBLE SURGERY  1999    I have reviewed the social history and family history with the patient and they are unchanged from previous note.  ALLERGIES:  has No Known Allergies.  MEDICATIONS:  Current Outpatient  Medications  Medication Sig Dispense Refill   b complex vitamins tablet Take 1 tablet by mouth daily.     capecitabine (XELODA) 500 MG tablet TAKE 2 TABLETS BY MOUTH  TWICE DAILY WITHIN 30  MINUTES OF MEALS. TAKE ON  DAYS 1 TO 14 OF EACH 21 DAY CYCLE 56 tablet 2   cyclobenzaprine (FLEXERIL) 5 MG tablet Take 1 tablet (5 mg total) by mouth 3 (three) times daily as needed for muscle spasms. 30 tablet 0   eltrombopag (PROMACTA) 50 MG tablet Take 1 tablet (50 mg total) by mouth daily. Take on an empty stomach 1 hour before a meal or 2 hours after 30 tablet 0   gabapentin (NEURONTIN) 100 MG capsule TAKE ONE CAPSULE BY MOUTH THREE TIMES DAILY (Patient taking differently: Take 100 mg by mouth 2 (two) times daily. ) 270 capsule 1   levofloxacin (LEVAQUIN) 500 MG tablet Take 1 tablet (500 mg total) by mouth daily. 7 tablet 0   lidocaine-prilocaine (EMLA) cream Apply 1 application topically as needed. 30 g 2   loperamide (IMODIUM) 2 MG capsule Take 1 capsule (2 mg total) by mouth as needed for diarrhea or loose stools. 30 capsule 0   loratadine (CLARITIN) 10 MG tablet Take 10 mg by mouth daily as needed for allergies.     magic mouthwash w/lidocaine SOLN Take 10 mLs by mouth 3 (three) times daily as needed for mouth pain. Swish and spit 10 ML by mouth 3 times daily as needed for mouth pain. 240 mL 0   meclizine (ANTIVERT) 25 MG tablet Take 25 mg by mouth 3 (  three) times daily as needed for dizziness.     ondansetron (ZOFRAN-ODT) 8 MG disintegrating tablet TAKE 1 TABLET BY MOUTH EVERY 8 HOURS AS NEEDED FOR NAUSE OR VOMITING (Patient taking differently: Take 8 mg by mouth every 8 (eight) hours as needed for nausea or vomiting. ) 40 tablet 0   potassium chloride (K-DUR) 10 MEQ tablet Take 1 tablet (10 mEq total) by mouth 4 (four) times daily. (Patient taking differently: Take 30 mEq by mouth daily. ) 360 tablet 2   prochlorperazine (COMPAZINE) 10 MG tablet Take 1 tablet (10 mg total) by mouth every 6  (six) hours as needed for nausea or vomiting. 40 tablet 2   Rivaroxaban (XARELTO) 15 MG TABS tablet Take 1 tablet (15 mg total) by mouth daily. 90 tablet 1   traMADol (ULTRAM) 50 MG tablet Take 1 tablet (50 mg total) by mouth every 6 (six) hours as needed for moderate pain or severe pain. 30 tablet 0   No current facility-administered medications for this visit.    Facility-Administered Medications Ordered in Other Visits  Medication Dose Route Frequency Provider Last Rate Last Dose   DOCEtaxel (TAXOTERE) 40 mg in sodium chloride 0.9 % 150 mL chemo infusion  30 mg/m2 (Treatment Plan Recorded) Intravenous Once [name], MD       gemcitabine (GEMZAR) 912 mg in sodium chloride 0.9 % 250 mL chemo infusion  600 mg/m2 (Treatment Plan Recorded) Intravenous Once [name], MD 219 mL/hr at 06/15/19 1155 912 mg at 06/15/19 1155   heparin lock flush 100 unit/mL  500 Units Intracatheter Once PRN [name], MD       sodium chloride flush (NS) 0.9 % injection 10 mL  10 mL Intracatheter Once [name], MD       sodium chloride flush (NS) 0.9 % injection 10 mL  10 mL Intracatheter PRN [name], MD        PHYSICAL EXAMINATION: ECOG PERFORMANCE STATUS: 1 - Symptomatic but completely ambulatory  Vitals:   06/15/19 1013  BP: 110/73  Pulse: 82  Resp: 17  Temp: 98.3 F (36.8 C)  SpO2: 100%   Filed Weights   06/15/19 1013  Weight: 106 lb 9.6 oz (48.4 kg)    GENERAL:alert, no distress and comfortable SKIN: no rash. Palms without erythema  EYES:  sclera clear NECK without mass LUNGS: clear with normal breathing effort HEART: regular rate & rhythm, no lower extremity edema ABDOMEN: abdomen soft, non-tender and normal bowel sounds. No hepatomegaly. LUQ benign Musculoskeletal:no cyanosis of digits NEURO: alert & oriented x 3 with fluent speech, normal gait PAC without erythema   LABORATORY DATA:  I have reviewed the data as listed CBC Latest Ref Rng & Units 06/15/2019 05/31/2019 05/26/2019    WBC 4.0 - 10.5 K/uL 4.2 4.5 3.8(L)  Hemoglobin 12.0 - 15.0 g/dL 9.5(L) 9.6(L) 9.2(L)  Hematocrit 36.0 - 46.0 % 29.7(L) 30.1(L) 28.8(L)  Platelets 150 - 400 K/uL 197 381 237     CMP Latest Ref Rng & Units 06/15/2019 05/31/2019 05/26/2019  Glucose 70 - 99 mg/dL 115(H) 123(H) 118(H)  BUN 6 - 20 mg/dL 20 21(H) 15  Creatinine 0.44 - 1.00 mg/dL 1.08(H) 1.02(H) 1.06(H)  Sodium 135 - 145 mmol/L 139 137 140  Potassium 3.5 - 5.1 mmol/L 3.7 3.7 3.8  Chloride 98 - 111 mmol/L 104 102 105  CO2 22 - 32 mmol/L '26 26 26  '$ Calcium 8.9 - 10.3 mg/dL 9.6 9.4 9.5  Total Protein 6.5 - 8.1 g/dL 7.6 7.6 7.3  Total Bilirubin 0.3 - 1.2 mg/dL 0.6 0.9 0.5  Alkaline Phos 38 - 126 U/L 250(H) 229(H) 178(H)  AST 15 - 41 U/L 42(H) 45(H) 45(H)  ALT 0 - 44 U/L 31 28 34      RADIOGRAPHIC STUDIES: I have personally reviewed the radiological images as listed and agreed with the findings in the report. No results found.   ASSESSMENT & PLAN: LYNNIX SCHONEMAN a 58 y.o.femalewith   1. 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 She was treated withfirst linechemoFOLFIRINOX with dose reduction. Due to neuropathyand cytopenia, chemo changed tosecond line 5-FU pump infusion and liposomal irinotecan every 2 weekswith dose reduction -Unfortunately she had disease progression withworseningliver mets.  -She started second-line Gemcitabine and Abraxane2 weeks on/1 week offon 09/12/18. She tolerateswell with no significant side effects except hair lossand spiked fever afterchemo -she developedacute cholecystitisand was hospitalized, s/p perc chole drain; she recovered well and resumed chemotherapy. After cycle 6 day 1 (01/02/19) she developed recurrent fever spikes and pancytopenia. Treatment was held for possible recurrent infection on 5/18. -Drain exchange was attempted in IR on 5/18 but drain fell out. Her case was discussed with IR and surgery.  Surgerynot recommended due to concern for disease progression while off chemo -she is at high risk for recurrent infectiononchemotherapy -s/p cycle 10 day 1 on 03/27/19, ultimately discontinued due to disease progression  -she started third line GTX on 04/10/19 s/p 3 cycles, tolerating very well overall   2. Intermittent fever spikes, abdominal pain -she presented to ED for this on 8/16. CT showed stable disease. Work up essentially negative -fever spikes could be related to tumor, chemo, or infection although no evidence of that on CT -continue tramadol PRN for pain, has not needed recently  3. Weight lossandmalnutrition -Due to chemo and underlying metastatic cancer. -she uses supplements -gradually increased   4. Transaminitis -Due to underlying chemo and malignancy -improved  5. Pancytopenia, secondary to chemo  6. Hypokalemia, CKD Stage III - on KCL supplementsTID  7.Peripheral neuropathy, grade 2 -Currently on Neurontin 200 mg at night and B complex -stable overall, mostly in her feet  8.Acute cholecystitis, status post cholecystectomy tube placement 4/23per IR - surgery was not recommended due to high risk of complication, and concerns for cancer progression when holding chemotherapy for surgery -Drainwas placed during hospitalization, withslightlybloody drainagedaily -Her CT CAP from 12/14/18-numerus gallstones. -Drain fell out on 5/18;imaging in 12/2018 consistent with cholecystitis with cystic duct obstruction. No leak on HIDA. Holding intervention given she is asymptomatic and invasive procedure would delay chemo. -[place] on 6/5 was done due to recurrent fever after resuming chemo; no indication for gallbladder drainage at this time -given prophylactic 5-day course levaquin on 04/13/19 due to fever spikes during chemo -no clinical evidence of recurrent infection today  9. LE DVT -Diagnosed on December 15, 2018, when she was hospitalized for  cholecystitis -Due to her bloody biliary drainage, and moderate thrombocytopenia from chemotherapy, she is on low-dosexarelto'15mg'$  daily (no loading dose)  10.Goal of care discussion -treatment goal is palliative  Disposition:  Ms. Gass appears stable. She completed 3 cycles of GTX. She tolerates treatment well overall. She had a transient episode of mild fever 100.6 and LUQ pain 2 weeks ago right after treatment, no recurrent episodes. Exam is benign. Etiology unclear. Will continue to monitor closely. She knows to call if she has recurrent episodes or pain, fever, or feeling ill. CBC and CMP are stable. CA 19-9 is  pending. She will proceed with cycle 4 day 4 taxotere and gemcitabine today and continue Xeloda 1000 mg BID on days 1-14 q21 days. She will receive the flu vaccine today. She will return for f/u and cycle 4 day 11 chemo next week. She will be referred for restaging CT in November.   All questions were answered. The patient knows to call the clinic with any problems, questions or concerns. No barriers to learning was detected. I spent 20 minutes counseling the patient face to face. The total time spent in the appointment was 25 minutes and more than 50% was on counseling and review of test results     Alla Feeling, NP 06/15/19

## 2019-06-16 ENCOUNTER — Telehealth: Payer: Self-pay | Admitting: Nurse Practitioner

## 2019-06-16 LAB — CANCER ANTIGEN 19-9: CA 19-9: 73 U/mL — ABNORMAL HIGH (ref 0–35)

## 2019-06-16 NOTE — Telephone Encounter (Signed)
Scheduled per los. Called and spoke with patient. Confirmed appts  

## 2019-06-20 NOTE — Progress Notes (Signed)
[facility]   [phone] [phone]   Clinic Follow up Note   Patient Care Team: [name], MD as PCP - General (Family Medicine) [name], MD as Consulting Physician (Otolaryngology) [name], MD as Consulting Physician (Oncology) 06/21/2019  CHIEF COMPLAINT: F/u pancreas cancer   SUMMARY OF ONCOLOGIC HISTORY: Oncology History Overview Note  Cancer Staging Pancreatic cancer [facility]) Staging form: Exocrine Pancreas, AJCC 8th Edition - Clinical stage from 08/06/2017: Stage IV (cTX, cN0, pM1) - Signed by [name], MD on 08/12/2017     Pancreatic cancer ([place])  07/23/2017 Imaging   US abdomen limited RUQ 07/23/17 IMPRESSION: 1. Cholelithiasis.  No secondary signs of acute cholecystitis. 2. Multiple solid liver masses measuring up to 6.5 cm in the left lobe of the liver, evaluation for metastatic disease is recommended. These results will be called to the ordering clinician or representative by the Radiologist Assistant, and communication documented in the PACS or zVision Dashboard.   07/23/2017 Imaging   CT Abdomen W Contrast 07/23/17 IMPRESSION: 1. Widespread metastatic disease throughout the liver. No clear primary malignancy identified in the abdomen. The pelvis was not imaged. Tissue sampling recommended. 2. Probable adenopathy superior to the pancreatic tail. No evidence of pancreatic mass. 3. Suspected incidental hemangioma inferiorly in the right hepatic lobe. 4. Nonspecific nodularity in the breasts. The patient has undergone recent (03/25/2017 and 04/01/2017) mammography and ultrasound.   07/29/2017 Initial Diagnosis   Metastasis to liver of unknown origin ([place])   07/29/2017 PET scan   PET 07/29/17  IMPRESSION: 1. Numerous bulky liver masses are hypermetabolic compatible with malignancy. 2. Accentuated activity within or along the pancreatic tail, likely represent a primary pancreatic tumor. Consider pancreatic  protocol MRI to further work this up. 3. The peripancreatic lymph node shown above the pancreatic tail is mildly hypermetabolic favoring malignancy. 4.  Prominent stool throughout the colon favors constipation. 5. 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   08/05/2017 Pathology Results   Liver Biopsy  Diagnosis 08/05/17 Liver, needle/core biopsy - CARCINOMA. - SEE COMMENT. Microscopic Comment The malignant cells are positive for cytokeratin 7. They are negative for arginase, CDX2, cytokeratin 20, estrogen receptor, GATA-3, GCDFP, Glypican 3, Hep Par 1, Napsin A, and TTF-1. This immunohistochemical is nonspecific. Possibly primary sources include pancreatobiliary and upper gastrointestinal. Radiologic correlation is necessary. Of note, organ specific markers (GATA-3, GCDFP-breast, TTF-1, Napsin A-lung, and CDX2-colon) are negative. (JBK:ecj 08/09/2017)   08/21/2017 - 02/10/2018 Chemotherapy   FOLFIRINOX every 2 weeks starting 08/21/17. Dose reduced due to neuropahty and cytopenia    10/14/2017 Imaging   CT CAP WO Contrast 10/14/17 IMPRESSION: Evidence of known numerous liver metastases without significant interval change. No other evidence of metastatic disease within the chest, abdomen or pelvis. Cholelithiasis. Tiny pericardial effusion.   12/02/2017 Genetic Testing   Negative for pathogenic mutation.  The genes analyzed were the 83 genes on [facility]'s Multi-Cancer panel (ALK, APC, ATM, AXIN2, BAP1, BARD1, BLM, BMPR1A, BRCA1, BRCA2, BRIP1, CASR, CDC73, CDH1, CDK4, CDKN1B, CDKN1C, CDKN2A, CEBPA, CHEK2, CTNNA1, DICER1, DIS3L2, EGFR, EPCAM, FH, FLCN, GATA2, GPC3, GREM1, HOXB13, HRAS, KIT, MAX, MEN1, MET, MITF, MLH1, MSH2, MSH3, MSH6, MUTYH, NBN, NF1, NF2, NTHL1, PALB2, PDGFRA, PHOX2B, PMS2, POLD1, POLE, POT1, PRKAR1A, PTCH1, PTEN, RAD50, RAD51C, RAD51D, RB1, RECQL4, RET, RUNX1, SDHA, SDHAF2, SDHB, SDHC, SDHD, SMAD4, SMARCA4, SMARCB1, SMARCE1, STK11, SUFU, TERC, TERT, TMEM127, TP53, TSC1, TSC2,  VHL, WRN, WT1).   12/17/2017 PET scan   IMPRESSION: 1. Although the liver metastases are still visible on  CT images, °they are significantly smaller and retain no abnormal metabolic °activity, consistent with response to therapy. °2. No abnormal activity within the pancreas. °3. No disease progression identified. °4. Cholelithiasis. °  °02/21/2018 PET scan  ° 02/21/2018 PET Scan  °IMPRESSION: °1. There are 2 new small nodules identified within the right lung °which measure up to 5 mm. These are too small to reliably °characterize by PET-CT, but warrant close interval follow-up. °2. Again noted are multifocal liver metastasis. The target lesion °within the left lobe is slightly decreased in size when compared °with the previous exam. Similar to previous exam there is no °abnormal hypermetabolism above background liver activity identified °within the liver lesions. °3. Gallstones. °  °02/23/2018 - 09/04/2018 Chemotherapy  ° 5-FU pump infusion and liposomal irinotecan, every 2 weeks.  First cycle dose reduced due to cytopenia in the travel. Stopped on 08/25/2018 due to disease progression.  °  °05/17/2018 Imaging  ° 05/17/2018 PET Scan °IMPRESSION: °1. Mixed response. The 2 small right lung nodules have decreased in °size in the interval. Single focus of increased uptake within the °liver is new from 02/21/2018 and concerning for recurrent °metabolically active liver metastasis. °2. Splenomegaly.  New from previous exam. °3. Diffusely increased bone marrow activity, likely reflecting °treatment related changes. °  °09/01/2018 PET scan  ° PET 09/01/18 °IMPRESSION: °1. Unfortunately there recurrence of hepatic metastasis with °multiple new hypermetabolic lesions in LEFT and RIGHT hepatic lobe. °2. New extra hepatic site of malignancy which appears associated the °mid pancreas. Difficult to define lesion on noncontrast exam. °3. Diffuse marrow activity is favored benign. °4. No evidence of pulmonary metastasis. °  °09/12/2018 -  03/27/2019 Chemotherapy  ° second-line Gemcitabine and Abraxane for 2 weeks on, 1 week off starting 09/12/2018. Due to neuropathy, I will start her with dose reduced Abraxane. Stopped after 03/27/19 due to disease progression.  °  °12/14/2018 Imaging  ° CT CAP  °IMPRESSION: °CT demonstrates acute cholecystitis, with choledocholithiasis of the °cystic duct. °  °These results were discussed by telephone at the time of °interpretation on 12/14/2018 at 5:05 pm with Dr. Feng. °  °Redemonstration of known liver metastases, with positive response to °therapy, interval reduction in size of all lesions, with multiple °demonstrating significant internal necrosis. °  °No acute finding of the chest. °  °Ancillary findings as above. °  °03/21/2019 Imaging  ° CT CAP W Contrast °IMPRESSION: °1. Numerous rim enhancing liver metastases show overall trend °towards mild progression although 1 of the index lesions is stable °in the interval. °2. Interval development of portocaval lymphadenopathy concerning for °disease progression. °3. Slight progression of main duct dilatation in the pancreas with °abrupt cut off in the region of the body. °4. Interval development of a 9 mm subpleural nodule at the right °base along the posterior hemidiaphragm. Close attention on follow-up °recommended as metastatic disease not excluded. °5. New splenomegaly. °6. Cholelithiasis with diffuse gallbladder wall thickening. °Gallbladder wall thickening is decreased in the interval. Stones and °sludge noted in the gallbladder lumen. °  °04/09/2019 Imaging  ° CT AP W Contrast 04/09/19  °IMPRESSION: °1. Multiple gallstones in lumen gallbladder without evidence acute °cholecystitis. No significant change from prior. No biliary duct °dilatation. Common bile duct normal. °2. Widespread hepatic metastasis unchanged from comparison CT. °3. Chronic pancreatic duct dilatation in the body and tail °unchanged. °4. No bowel obstruction. °5. Small amount of intraperitoneal free  fluid unchanged. °  °04/10/2019 -  Chemotherapy  ° Third-line GTX with Xeloda 1000mg BID day 1-14, off   for 1 week with Docetaxel and Gemcitbine on day 4 and day 11 every 3 weeks starting oral chemo on 04/10/19.  °  °Pancreatic cancer metastasized to liver ([facility])  °08/11/2017 Initial Diagnosis  ° Pancreatic cancer metastasized to liver ([facility]) °  ° ° °CURRENT THERAPY: Third-line GTX with Xeloda 1000mg BID day 1-14, off for 1 week with Docetaxel 30mg/m2 and Gemcitabine 750mg/m2 on day 4 and day 11 every 3 weeks starting oral chemo on 04/10/19.  ° °INTERVAL HISTORY: Ms. Freiman returns for f/u and treatment as scheduled. She completed cycle 4 day 4 GTX on 06/15/19. She feels well today. Had low grade temp to 100.8 after last treatment, resolved with tylenol. No chills. No recurrent LUQ or new pain. Appetite is stable. Energy level is fair but she is tired mentally. Neuropathy is stable. No n/v/c/d. Denies cough, chest pain, dyspnea, leg swelling, or hand/foot redness or peeling. She ate shrimp last night and left side of lips swelled and turned purple, no other mouth sores. Denies food/seafood allergy. No tongue swelling or difficulty breathing.  ° ° °MEDICAL HISTORY:  °Past Medical History:  °Diagnosis Date  °• Diarrhea of presumed infectious origin 01/12/2018  °• Gastroenteritis 01/12/2018  °• Genetic testing 12/02/2017  ° Multi-Cancer panel (83 genes) @ [facility] - No pathogenic mutations detected  °• Meniere disease 2016  °• Pancreatitis   ° ° °SURGICAL HISTORY: °Past Surgical History:  °Procedure Laterality Date  °• CERVICAL ABLATION    °• ENDOMETRIAL ABLATION  2011  °• IR CHOLANGIOGRAM EXISTING TUBE  01/09/2019  °• IR FLUORO GUIDE PORT INSERTION RIGHT  08/12/2017  °• IR PERC CHOLECYSTOSTOMY  12/15/2018  °• IR US GUIDE VASC ACCESS RIGHT  08/12/2017  °• MANDIBLE SURGERY  1999  ° ° °I have reviewed the social history and family history with the patient and they are unchanged from previous note. ° °ALLERGIES:  has No Known  Allergies. ° °MEDICATIONS:  °Current Outpatient Medications  °Medication Sig Dispense Refill  °• b complex vitamins tablet Take 1 tablet by mouth daily.    °• capecitabine (XELODA) 500 MG tablet TAKE 2 TABLETS BY MOUTH  TWICE DAILY WITHIN 30  MINUTES OF MEALS. TAKE ON  DAYS 1 TO 14 OF EACH 21 DAY CYCLE 56 tablet 2  °• cyclobenzaprine (FLEXERIL) 5 MG tablet Take 1 tablet (5 mg total) by mouth 3 (three) times daily as needed for muscle spasms. 30 tablet 0  °• eltrombopag (PROMACTA) 50 MG tablet Take 1 tablet (50 mg total) by mouth daily. Take on an empty stomach 1 hour before a meal or 2 hours after 30 tablet 0  °• gabapentin (NEURONTIN) 100 MG capsule TAKE ONE CAPSULE BY MOUTH THREE TIMES DAILY (Patient taking differently: Take 100 mg by mouth 2 (two) times daily. ) 270 capsule 1  °• levofloxacin (LEVAQUIN) 500 MG tablet Take 1 tablet (500 mg total) by mouth daily. 7 tablet 0  °• lidocaine-prilocaine (EMLA) cream Apply 1 application topically as needed. 30 g 2  °• potassium chloride (K-DUR) 10 MEQ tablet Take 1 tablet (10 mEq total) by mouth 4 (four) times daily. (Patient taking differently: Take 30 mEq by mouth daily. ) 360 tablet 2  °• Rivaroxaban (XARELTO) 15 MG TABS tablet Take 1 tablet (15 mg total) by mouth daily. 90 tablet 1  °• loperamide (IMODIUM) 2 MG capsule Take 1 capsule (2 mg total) by mouth as needed for diarrhea or loose stools. (Patient not taking: Reported on 06/21/2019) 30 capsule 0  °• loratadine (CLARITIN) 10 MG tablet Take 10 mg   mg by mouth daily as needed for allergies.     magic mouthwash w/lidocaine SOLN Take 10 mLs by mouth 3 (three) times daily as needed for mouth pain. Swish and spit 10 ML by mouth 3 times daily as needed for mouth pain. (Patient not taking: Reported on 06/21/2019) 240 mL 0   meclizine (ANTIVERT) 25 MG tablet Take 25 mg by mouth 3 (three) times daily as needed for dizziness.     ondansetron (ZOFRAN-ODT) 8 MG disintegrating tablet TAKE 1 TABLET BY MOUTH EVERY 8 HOURS AS  NEEDED FOR NAUSE OR VOMITING (Patient not taking: No sig reported) 40 tablet 0   prochlorperazine (COMPAZINE) 10 MG tablet Take 1 tablet (10 mg total) by mouth every 6 (six) hours as needed for nausea or vomiting. (Patient not taking: Reported on 06/21/2019) 40 tablet 2   traMADol (ULTRAM) 50 MG tablet Take 1 tablet (50 mg total) by mouth every 6 (six) hours as needed for moderate pain or severe pain. (Patient not taking: Reported on 06/21/2019) 30 tablet 0   No current facility-administered medications for this visit.    Facility-Administered Medications Ordered in Other Visits  Medication Dose Route Frequency Provider Last Rate Last Dose   sodium chloride flush (NS) 0.9 % injection 10 mL  10 mL Intracatheter Once [name], MD        PHYSICAL EXAMINATION: ECOG PERFORMANCE STATUS: 1 - Symptomatic but completely ambulatory  Vitals:   06/21/19 0923  BP: 113/75  Pulse: 78  Resp: 18  Temp: 98.5 F (36.9 C)  SpO2: 100%   Filed Weights   06/21/19 0923  Weight: 105 lb 6.4 oz (47.8 kg)    GENERAL:alert, no distress and comfortable SKIN: no rash or juandice EYES: sclera clear OROPHARYNX: left lip ecchymosis  LUNGS: clear with normal breathing effort HEART: regular rate & rhythm, no lower extremity edema ABDOMEN:abdomen soft, non-tender and normal bowel sounds NEURO: alert & oriented x 3 with fluent speech PAC without erythema   LABORATORY DATA:  I have reviewed the data as listed CBC Latest Ref Rng & Units 06/21/2019 06/15/2019 05/31/2019  WBC 4.0 - 10.5 K/uL 1.2(L) 4.2 4.5  Hemoglobin 12.0 - 15.0 g/dL 9.4(L) 9.5(L) 9.6(L)  Hematocrit 36.0 - 46.0 % 29.0(L) 29.7(L) 30.1(L)  Platelets 150 - 400 K/uL 200 197 381     CMP Latest Ref Rng & Units 06/21/2019 06/15/2019 05/31/2019  Glucose 70 - 99 mg/dL 116(H) 115(H) 123(H)  BUN 6 - 20 mg/dL 23(H) 20 21(H)  Creatinine 0.44 - 1.00 mg/dL 1.03(H) 1.08(H) 1.02(H)  Sodium 135 - 145 mmol/L 141 139 137  Potassium 3.5 - 5.1 mmol/L 3.7  3.7 3.7  Chloride 98 - 111 mmol/L 104 104 102  CO2 22 - 32 mmol/L _0 Calcium 8.9 - 10.3 mg/dL 9.7 9.6 9.4  Total Protein 6.5 - 8.1 g/dL 7.7 7.6 7.6  Total Bilirubin 0.3 - 1.2 mg/dL 0.5 0.6 0.9  Alkaline Phos 38 - 126 U/L 231(H) 250(H) 229(H)  AST 15 - 41 U/L 62(H) 42(H) 45(H)  ALT 0 - 44 U/L 51(H) 31 28      RADIOGRAPHIC STUDIES: I have personally reviewed the radiological images as listed and agreed with the findings in the report. No results found.   ASSESSMENT & PLAN: GAGANDEEP KOSSMAN a 58 y.o.femalewith   1. 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 She was treated withfirst linechemoFOLFIRINOX with dose reduction. Due to neuropathyand cytopenia, chemo changed tosecond  5-FU pump infusion and liposomal irinotecan every 2 weeks with dose reduction  °-Unfortunately she had disease progression with worsening liver mets.  °-She started second-line Gemcitabine and Abraxane 2 weeks on/1 week off on 09/12/18. She tolerates well with no significant side effects except hair loss and spiked fever after chemo  °-she developed acute cholecystitis and was hospitalized, s/p perc chole drain; she recovered well and resumed chemotherapy. After cycle 6 day 1 (01/02/19) she developed recurrent fever spikes and pancytopenia. Treatment was held for possible recurrent infection on 5/18. °-Drain exchange was attempted in IR on 5/18 but drain fell out. Her case was discussed with IR and surgery. Surgery not recommended due to concern for disease progression while off chemo  °-she is at high risk for recurrent infection on chemotherapy °-s/p cycle 10 day 1 on 03/27/19, ultimately discontinued due to disease progression  °-she started third line GTX on 04/10/19 s/p cycle 4 day 4, tolerating very well overall  °-CA 19-9 71 on 10/22, overall stable on this regimen °-Ms. Kimberley appears stable today.  She is currently cycle 4 fay 11 Xeloda.   She had a low-grade fever last week, no recurrent fever or any pain recently.  She has recovered well. °-Labs reviewed, ANC 0.8.  CMP stable. We reviewed her high risk for recurrent infection, I would recommend prophylactic antibiotics if we proceed with treatment.  °-We discussed her treatment options including continue Xeloda and receive gemcitabine and Taxotere today with Zarxio support on 11/2 vs hold gem/Taxotere and continue Xeloda only to complete 14 days °-Usually Ms. Summerson prefers to push ahead with treatment when possible, however, she appears to struggle with the decision today.  She notes she is mentally tired, she became tearful. °-we discussed goals of care. Her ANC is borderline, she is at high risk for recurrent infection. Ultimately decided to hold gemcitabine and taxotere today which I agree. She will continue Xeloda 1000 mg BID until 10/31 which will complete 14 days.  °-she is scheduled for restaging CT on 11/9 °-f/u on 11/12 °  °2. Intermittent fever spikes, abdominal pain °-she presented to ED for this on 8/16. CT showed stable disease. Work up essentially negative °-fever spikes could be related to tumor, chemo, or infection although no evidence of that on CT °-continue tramadol PRN for pain, has not needed recently  °-Low-grade fever to 100.8 on 10/23, this resolved with Tylenol °-She receives intermittent prophylactic antibiotics while on chemo °  °3. Weight loss and malnutrition °-Due to chemo and underlying metastatic cancer. °-she uses supplements °-gradually increased  °  °4. Transaminitis °-Due to underlying chemo and malignancy °-improved °  °5. Pancytopenia, secondary to chemo °-Anemia has improved, thrombocytopenia resolved  °-She has intermittent neutropenia, received Zarxio on cycle 2 when ANC was 0.7; she recovered °-ANC 0.8 today °  °6. Hypokalemia, CKD Stage III °- on KCL supplements TID °  °7. Peripheral neuropathy, grade 2 °-Currently on Neurontin 200 mg at night and B  complex °-stable overall, mostly in her feet  °  °8. Acute cholecystitis, status post cholecystectomy tube placement 4/23 per IR °- surgery was not recommended due to high risk of complication, and concerns for cancer progression when holding chemotherapy for surgery °-Drain was placed during hospitalization, with slightly bloody drainage daily  °-Her CT CAP from 12/14/18 -numerus gallstones. °-Drain fell out on 5/18; imaging in 12/2018 consistent with cholecystitis with cystic duct obstruction. No leak on HIDA. Holding intervention given she is asymptomatic and invasive procedure would delay chemo.  °-US on 6/5 was done due to recurrent fever after resuming chemo; no indication for gallbladder drainage at this time °-given prophylactic 5-day course levaquin on 04/13/19 due to fever spikes during chemo  °-no clinical evidence of   recurrent infection today °  °9. LE DVT °-Diagnosed on December 15, 2018, when she was hospitalized for cholecystitis °-Due to her bloody biliary drainage, and moderate thrombocytopenia from chemotherapy, she is on low-dose xarelto 15mg daily (no loading dose) °   °10. Goal of care discussion °-treatment goal is palliative °  ° °PLAN: °-Labs reviewed °-goals of care discussion °-ANC 0.8 today °-Hold gemcitabine and taxotere for neutropenia and patient preference  °-Continue Xeloda 1000 mg BID to complete 14 days on 10/31 °-Restaging CT on 11/9 °-F/u with Dr. Feng on 11/12 ° °All questions were answered. The patient knows to call the clinic with any problems, questions or concerns. No barriers to learning was detected. °I spent 20 minutes counseling the patient face to face. The total time spent in the appointment was 25 minutes and more than 50% was on counseling and review of test results ° °  ° Lacie K Burton, NP °06/21/19  ° ° °

## 2019-06-21 ENCOUNTER — Inpatient Hospital Stay (HOSPITAL_BASED_OUTPATIENT_CLINIC_OR_DEPARTMENT_OTHER): Payer: 59 | Admitting: Nurse Practitioner

## 2019-06-21 ENCOUNTER — Encounter: Payer: Self-pay | Admitting: Nurse Practitioner

## 2019-06-21 ENCOUNTER — Other Ambulatory Visit: Payer: Self-pay

## 2019-06-21 ENCOUNTER — Inpatient Hospital Stay: Payer: 59

## 2019-06-21 VITALS — BP 113/75 | HR 78 | Temp 98.5°F | Resp 18 | Ht 65.0 in | Wt 105.4 lb

## 2019-06-21 DIAGNOSIS — C259 Malignant neoplasm of pancreas, unspecified: Secondary | ICD-10-CM

## 2019-06-21 DIAGNOSIS — D701 Agranulocytosis secondary to cancer chemotherapy: Secondary | ICD-10-CM | POA: Diagnosis not present

## 2019-06-21 DIAGNOSIS — C787 Secondary malignant neoplasm of liver and intrahepatic bile duct: Secondary | ICD-10-CM

## 2019-06-21 DIAGNOSIS — Z7189 Other specified counseling: Secondary | ICD-10-CM

## 2019-06-21 DIAGNOSIS — T451X5A Adverse effect of antineoplastic and immunosuppressive drugs, initial encounter: Secondary | ICD-10-CM

## 2019-06-21 DIAGNOSIS — Z95828 Presence of other vascular implants and grafts: Secondary | ICD-10-CM

## 2019-06-21 DIAGNOSIS — C251 Malignant neoplasm of body of pancreas: Secondary | ICD-10-CM

## 2019-06-21 DIAGNOSIS — Z5111 Encounter for antineoplastic chemotherapy: Secondary | ICD-10-CM | POA: Diagnosis not present

## 2019-06-21 LAB — COMPREHENSIVE METABOLIC PANEL
ALT: 51 U/L — ABNORMAL HIGH (ref 0–44)
AST: 62 U/L — ABNORMAL HIGH (ref 15–41)
Albumin: 3.8 g/dL (ref 3.5–5.0)
Alkaline Phosphatase: 231 U/L — ABNORMAL HIGH (ref 38–126)
Anion gap: 12 (ref 5–15)
BUN: 23 mg/dL — ABNORMAL HIGH (ref 6–20)
CO2: 25 mmol/L (ref 22–32)
Calcium: 9.7 mg/dL (ref 8.9–10.3)
Chloride: 104 mmol/L (ref 98–111)
Creatinine, Ser: 1.03 mg/dL — ABNORMAL HIGH (ref 0.44–1.00)
GFR calc Af Amer: >60 mL/min (ref >60)
GFR calc non Af Amer: >60 mL/min (ref >60)
Glucose, Bld: 116 mg/dL — ABNORMAL HIGH (ref 70–99)
Potassium: 3.7 mmol/L (ref 3.5–5.1)
Sodium: 141 mmol/L (ref 135–145)
Total Bilirubin: 0.5 mg/dL (ref 0.3–1.2)
Total Protein: 7.7 g/dL (ref 6.5–8.1)

## 2019-06-21 LAB — CBC WITH DIFFERENTIAL/PLATELET
Abs Immature Granulocytes: 0 10*3/uL (ref 0.00–0.07)
Basophils Absolute: 0 10*3/uL (ref 0.0–0.1)
Basophils Relative: 1 %
Eosinophils Absolute: 0 10*3/uL (ref 0.0–0.5)
Eosinophils Relative: 2 %
HCT: 29 % — ABNORMAL LOW (ref 36.0–46.0)
Hemoglobin: 9.4 g/dL — ABNORMAL LOW (ref 12.0–15.0)
Immature Granulocytes: 0 %
Lymphocytes Relative: 26 %
Lymphs Abs: 0.3 10*3/uL — ABNORMAL LOW (ref 0.7–4.0)
MCH: 32.8 pg (ref 26.0–34.0)
MCHC: 32.4 g/dL (ref 30.0–36.0)
MCV: 101 fL — ABNORMAL HIGH (ref 80.0–100.0)
Monocytes Absolute: 0.1 10*3/uL (ref 0.1–1.0)
Monocytes Relative: 4 %
Neutro Abs: 0.8 10*3/uL — ABNORMAL LOW (ref 1.7–7.7)
Neutrophils Relative %: 67 %
Platelets: 200 10*3/uL (ref 150–400)
RBC: 2.87 MIL/uL — ABNORMAL LOW (ref 3.87–5.11)
RDW: 17.4 % — ABNORMAL HIGH (ref 11.5–15.5)
WBC: 1.2 10*3/uL — ABNORMAL LOW (ref 4.0–10.5)
nRBC: 0 % (ref 0.0–0.2)

## 2019-06-21 MED ORDER — SODIUM CHLORIDE 0.9% FLUSH
10.0000 mL | Freq: Once | INTRAVENOUS | Status: AC
  Administered 2019-06-21: 09:00:00 10 mL
  Filled 2019-06-21: qty 10

## 2019-06-21 NOTE — Patient Instructions (Signed)

## 2019-06-22 ENCOUNTER — Other Ambulatory Visit: Payer: 59

## 2019-06-22 ENCOUNTER — Ambulatory Visit: Payer: 59 | Admitting: Hematology

## 2019-06-22 ENCOUNTER — Ambulatory Visit: Payer: 59

## 2019-06-24 ENCOUNTER — Other Ambulatory Visit: Payer: Self-pay | Admitting: Hematology

## 2019-07-03 ENCOUNTER — Other Ambulatory Visit: Payer: Self-pay

## 2019-07-03 ENCOUNTER — Encounter (HOSPITAL_COMMUNITY): Payer: Self-pay

## 2019-07-03 ENCOUNTER — Ambulatory Visit (HOSPITAL_COMMUNITY)
Admission: RE | Admit: 2019-07-03 | Discharge: 2019-07-03 | Disposition: A | Discharge status: Home / Self Care | Payer: 59 | Source: Ambulatory Visit | Attending: Nurse Practitioner | Admitting: Nurse Practitioner

## 2019-07-03 DIAGNOSIS — D490 Neoplasm of unspecified behavior of digestive system: Secondary | ICD-10-CM | POA: Insufficient documentation

## 2019-07-03 HISTORY — DX: Malignant (primary) neoplasm, unspecified: C80.1

## 2019-07-03 MED ORDER — IOHEXOL 300 MG/ML  SOLN
100.0000 mL | Freq: Once | INTRAMUSCULAR | Status: AC | PRN
  Administered 2019-07-03: 11:00:00 100 mL via INTRAVENOUS

## 2019-07-03 MED ORDER — SODIUM CHLORIDE (PF) 0.9 % IJ SOLN
INTRAMUSCULAR | Status: AC
  Filled 2019-07-03: qty 50

## 2019-07-05 NOTE — Progress Notes (Signed)
[facility]   Telephone:([phone] Fax:(336) [phone]   Clinic Follow up Note   Patient Care Team: [name], MD as PCP - General (Family Medicine) [name], MD as Consulting Physician (Otolaryngology) [name], MD as Consulting Physician (Oncology)  Date of Service:  07/06/2019  CHIEF COMPLAINT: F/u of pancreatic cancer  SUMMARY OF ONCOLOGIC HISTORY: Oncology History Overview Note  Cancer Staging Pancreatic cancer [facility]) Staging form: Exocrine Pancreas, AJCC 8th Edition - Clinical stage from 08/06/2017: Stage IV (cTX, cN0, pM1) - Signed by [name], MD on 08/12/2017     Pancreatic cancer ([place])  07/23/2017 Imaging   US abdomen limited RUQ 07/23/17 IMPRESSION: 1. Cholelithiasis.  No secondary signs of acute cholecystitis. 2. Multiple solid liver masses measuring up to 6.5 cm in the left lobe of the liver, evaluation for metastatic disease is recommended. These results will be called to the ordering clinician or representative by the Radiologist Assistant, and communication documented in the PACS or zVision Dashboard.   07/23/2017 Imaging   CT Abdomen W Contrast 07/23/17 IMPRESSION: 1. Widespread metastatic disease throughout the liver. No clear primary malignancy identified in the abdomen. The pelvis was not imaged. Tissue sampling recommended. 2. Probable adenopathy superior to the pancreatic tail. No evidence of pancreatic mass. 3. Suspected incidental hemangioma inferiorly in the right hepatic lobe. 4. Nonspecific nodularity in the breasts. The patient has undergone recent (03/25/2017 and 04/01/2017) mammography and ultrasound.   07/29/2017 Initial Diagnosis   Metastasis to liver of unknown origin ([place])   07/29/2017 PET scan   PET 07/29/17  IMPRESSION: 1. Numerous bulky liver masses are hypermetabolic compatible with malignancy. 2. Accentuated activity within or along the pancreatic tail, likely represent a primary pancreatic tumor.  Consider pancreatic protocol MRI to further work this up. 3. The peripancreatic lymph node shown above the pancreatic tail is mildly hypermetabolic favoring malignancy. 4.  Prominent stool throughout the colon favors constipation. 5. 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   08/05/2017 Pathology Results   Liver Biopsy  Diagnosis 08/05/17 Liver, needle/core biopsy - CARCINOMA. - SEE COMMENT. Microscopic Comment The malignant cells are positive for cytokeratin 7. They are negative for arginase, CDX2, cytokeratin 20, estrogen receptor, GATA-3, GCDFP, Glypican 3, Hep Par 1, Napsin A, and TTF-1. This immunohistochemical is nonspecific. Possibly primary sources include pancreatobiliary and upper gastrointestinal. Radiologic correlation is necessary. Of note, organ specific markers (GATA-3, GCDFP-breast, TTF-1, Napsin A-lung, and CDX2-colon) are negative. (JBK:ecj 08/09/2017)   08/21/2017 - 02/10/2018 Chemotherapy   FOLFIRINOX every 2 weeks starting 08/21/17. Dose reduced due to neuropahty and cytopenia    10/14/2017 Imaging   CT CAP WO Contrast 10/14/17 IMPRESSION: Evidence of known numerous liver metastases without significant interval change. No other evidence of metastatic disease within the chest, abdomen or pelvis. Cholelithiasis. Tiny pericardial effusion.   12/02/2017 Genetic Testing   Negative for pathogenic mutation.  The genes analyzed were the 83 genes on [facility]'s Multi-Cancer panel (ALK, APC, ATM, AXIN2, BAP1, BARD1, BLM, BMPR1A, BRCA1, BRCA2, BRIP1, CASR, CDC73, CDH1, CDK4, CDKN1B, CDKN1C, CDKN2A, CEBPA, CHEK2, CTNNA1, DICER1, DIS3L2, EGFR, EPCAM, FH, FLCN, GATA2, GPC3, GREM1, HOXB13, HRAS, KIT, MAX, MEN1, MET, MITF, MLH1, MSH2, MSH3, MSH6, MUTYH, NBN, NF1, NF2, NTHL1, PALB2, PDGFRA, PHOX2B, PMS2, POLD1, POLE, POT1, PRKAR1A, PTCH1, PTEN, RAD50, RAD51C, RAD51D, RB1, RECQL4, RET, RUNX1, SDHA, SDHAF2, SDHB, SDHC, SDHD, SMAD4, SMARCA4, SMARCB1, SMARCE1, STK11, SUFU, TERC, TERT,  TMEM127, TP53, TSC1, TSC2, VHL, WRN, WT1).   12/17/2017 PET scan   IMPRESSION: 1. Although the liver metastases  are still visible on the CT images, they are significantly smaller and retain no abnormal metabolic activity, consistent with response to therapy. 2. No abnormal activity within the pancreas. 3. No disease progression identified. 4. Cholelithiasis.   02/21/2018 PET scan   02/21/2018 PET Scan  IMPRESSION: 1. There are 2 new small nodules identified within the right lung which measure up to 5 mm. These are too small to reliably characterize by PET-CT, but warrant close interval follow-up. 2. Again noted are multifocal liver metastasis. The target lesion within the left lobe is slightly decreased in size when compared with the previous exam. Similar to previous exam there is no abnormal hypermetabolism above background liver activity identified within the liver lesions. 3. Gallstones.   02/23/2018 - 09/04/2018 Chemotherapy   5-FU pump infusion and liposomal irinotecan, every 2 weeks.  First cycle dose reduced due to cytopenia in the travel. Stopped on 08/25/2018 due to disease progression.    05/17/2018 Imaging   05/17/2018 PET Scan IMPRESSION: 1. Mixed response. The 2 small right lung nodules have decreased in size in the interval. Single focus of increased uptake within the liver is new from 02/21/2018 and concerning for recurrent metabolically active liver metastasis. 2. Splenomegaly.  New from previous exam. 3. Diffusely increased bone marrow activity, likely reflecting treatment related changes.   09/01/2018 PET scan   PET 09/01/18 IMPRESSION: 1. Unfortunately there recurrence of hepatic metastasis with multiple new hypermetabolic lesions in LEFT and RIGHT hepatic lobe. 2. New extra hepatic site of malignancy which appears associated the mid pancreas. Difficult to define lesion on noncontrast exam. 3. Diffuse marrow activity is favored benign. 4. No evidence of pulmonary  metastasis.   09/12/2018 - 03/27/2019 Chemotherapy   second-line Gemcitabine and Abraxane for 2 weeks on, 1 week off starting 09/12/2018. Due to neuropathy, I will start her with dose reduced Abraxane. Stopped after 03/27/19 due to disease progression.    12/14/2018 Imaging   CT CAP  IMPRESSION: CT demonstrates acute cholecystitis, with choledocholithiasis of the cystic duct.  These results were discussed by telephone at the time of interpretation on 12/14/2018 at 5:05 pm with Dr. Burr Medico.  Redemonstration of known liver metastases, with positive response to therapy, interval reduction in size of all lesions, with multiple demonstrating significant internal necrosis.  No acute finding of the chest.  Ancillary findings as above.   03/21/2019 Imaging   CT CAP W Contrast IMPRESSION: 1. Numerous rim enhancing liver metastases show overall trend towards mild progression although 1 of the index lesions is stable in the interval. 2. Interval development of portocaval lymphadenopathy concerning for disease progression. 3. Slight progression of main duct dilatation in the pancreas with abrupt cut off in the region of the body. 4. Interval development of a 9 mm subpleural nodule at the right base along the posterior hemidiaphragm. Close attention on follow-up recommended as metastatic disease not excluded. 5. New splenomegaly. 6. Cholelithiasis with diffuse gallbladder wall thickening. Gallbladder wall thickening is decreased in the interval. Stones and sludge noted in the gallbladder lumen.   04/09/2019 Imaging   CT AP W Contrast 04/09/19  IMPRESSION: 1. Multiple gallstones in lumen gallbladder without evidence acute cholecystitis. No significant change from prior. No biliary duct dilatation. Common bile duct normal. 2. Widespread hepatic metastasis unchanged from comparison CT. 3. Chronic pancreatic duct dilatation in the body and tail unchanged. 4. No bowel obstruction. 5. Small  amount of intraperitoneal free fluid unchanged.   04/10/2019 -  Chemotherapy   Third-line GTX with Xeloda  1019m BID day 1-14, off for 1 week with Docetaxel and Gemcitbine on day 4 and day 11 every 3 weeks starting oral chemo on 04/10/19.    07/03/2019 Imaging   CT CAP W Contrast  IMPRESSION: 1. Overall stable diffuse hepatic metastatic disease. No new/progressive findings. 2. Stable to slightly larger periportal lymph node. 3. Stable CT appearance of the pancreas. Abrupt cut off of a dilated main pancreatic duct in the body/head junction region without obvious pancreatic lesion. 4. No pulmonary nodules are identified at the lung bases. 5. Stable mild splenomegaly. 6. No new/acute findings.     [place]  08/[place] Initial Diagnosis   Pancreatic cancer metastasized to liver ([facility]      CURRENT THERAPY:  Third-line GTX with Xeloda10053mID day 1-14, off for 1 week with Docetaxel3061m2and Gemcitabine750m58mon day 4 and day 11 every 3 weeks starting oral chemo on 04/10/19.Starting 07/06/19 she will reduce Xeloda to 1000mg79mthe AM and 500mg 53mhe PM due to low blood counts.   INTERVAL HISTORY:  JaniceJEFF MCCALLUMre for a follow up and treatment. She presents to the clinic with her husband. She notes she is overall doing well. She notes in the last week she feels different with bloating in her abdomen but nothing she can pinpoint, no pain. She notes her BMs are regular. She notes her appetite is fair but she is able to maintain her weight. She notes concerns given her low blood counts and vacations that she has been off treatment more than on it. She is interested in Granix injections. She also is interested in clinical trail and immunotherapy that is available to her. She started her Xeloda 1000mg B91mn 11/9.    REVIEW OF SYSTEMS:   Constitutional: Denies fevers, chills or abnormal weight loss Eyes: Denies blurriness of vision Ears,  nose, mouth, throat, and face: Denies mucositis or sore throat Respiratory: Denies cough, dyspnea or wheezes Cardiovascular: Denies palpitation, chest discomfort or lower extremity swelling Gastrointestinal:  Denies nausea, heartburn or change in bowel habits (+) abdominal bloating  Skin: Denies abnormal skin rashes Lymphatics: Denies new lymphadenopathy or easy bruising Neurological:Denies numbness, tingling or new weaknesses Behavioral/Psych: Mood is stable, no new changes  All other systems were reviewed with the patient and are negative.  MEDICAL HISTORY:  Past Medical History:  Diagnosis Date   Diarrhea of presumed infectious origin 01/12/2018   Gastroenteritis 01/12/2018   Genetic testing 12/02/2017   Multi-Cancer panel (83 genes) @ [facility] - No pathogenic mutations detected   Meniere disease 2016   met pancreatic ca to liver dx'd 07/2017   Pancreatitis     SURGICAL HISTORY: Past Surgical History:  Procedure Laterality Date   CERVICAL ABLATION     ENDOMETRIAL ABLATION  2011   IR CHOLANGIOGRAM EXISTING TUBE  01/09/2019   IR FLUORO GUIDE PORT INSERTION RIGHT  08/12/2017   IR PERC CHOLECYSTOSTOMY  12/15/2018   IR US GUID[place]VASC ACCESS RIGHT  08/12/2017   MANDIBLE SURGERY  1999    I have reviewed the social history and family history with the patient and they are unchanged from previous note.  ALLERGIES:  has No Known Allergies.  MEDICATIONS:  Current Outpatient Medications  Medication Sig Dispense Refill   b complex vitamins tablet Take 1 tablet by mouth daily.     capecitabine (XELODA) 500 MG tablet TAKE 2 TABLETS BY MOUTH  TWICE DAILY WITHIN 30  MINUTES OF MEALS. TAKE ON  DAYS 1 TO 14  OF EACH 21 DAY CYCLE 56 tablet 2   cyclobenzaprine (FLEXERIL) 5 MG tablet Take 1 tablet (5 mg total) by mouth 3 (three) times daily as needed for muscle spasms. 30 tablet 0   eltrombopag (PROMACTA) 50 MG tablet Take 1 tablet (50 mg total) by mouth daily. Take on an empty  stomach 1 hour before a meal or 2 hours after 30 tablet 2   gabapentin (NEURONTIN) 100 MG capsule TAKE ONE CAPSULE BY MOUTH THREE TIMES DAILY (Patient taking differently: Take 100 mg by mouth 2 (two) times daily. ) 270 capsule 1   levofloxacin (LEVAQUIN) 500 MG tablet Take 1 tablet (500 mg total) by mouth daily. 7 tablet 0   lidocaine-prilocaine (EMLA) cream Apply 1 application topically as needed. 30 g 2   loperamide (IMODIUM) 2 MG capsule Take 1 capsule (2 mg total) by mouth as needed for diarrhea or loose stools. 30 capsule 0   loratadine (CLARITIN) 10 MG tablet Take 10 mg by mouth daily as needed for allergies.     magic mouthwash w/lidocaine SOLN Take 10 mLs by mouth 3 (three) times daily as needed for mouth pain. Swish and spit 10 ML by mouth 3 times daily as needed for mouth pain. 240 mL 0   meclizine (ANTIVERT) 25 MG tablet Take 25 mg by mouth 3 (three) times daily as needed for dizziness.     ondansetron (ZOFRAN-ODT) 8 MG disintegrating tablet TAKE 1 TABLET BY MOUTH EVERY 8 HOURS AS NEEDED FOR NAUSE OR VOMITING 40 tablet 0   potassium chloride (K-DUR) 10 MEQ tablet Take 1 tablet (10 mEq total) by mouth 4 (four) times daily. (Patient taking differently: Take 30 mEq by mouth daily. ) 360 tablet 2   prochlorperazine (COMPAZINE) 10 MG tablet Take 1 tablet (10 mg total) by mouth every 6 (six) hours as needed for nausea or vomiting. 40 tablet 2   Rivaroxaban (XARELTO) 15 MG TABS tablet Take 1 tablet (15 mg total) by mouth daily. 90 tablet 1   traMADol (ULTRAM) 50 MG tablet Take 1 tablet (50 mg total) by mouth every 6 (six) hours as needed for moderate pain or severe pain. 30 tablet 0   No current facility-administered medications for this visit.    Facility-Administered Medications Ordered in Other Visits  Medication Dose Route Frequency Provider Last Rate Last Dose   sodium chloride flush (NS) 0.9 % injection 10 mL  10 mL Intracatheter Once [name], MD        PHYSICAL  EXAMINATION: ECOG PERFORMANCE STATUS: 1 - Symptomatic but completely ambulatory  Vitals:   07/06/19 1050  BP: 104/66  Pulse: 89  Resp: 18  Temp: 98.7 F (37.1 C)  SpO2: 100%   Filed Weights   07/06/19 1050  Weight: 107 lb 3.2 oz (48.6 kg)    GENERAL:alert, no distress and comfortable SKIN: skin color, texture, turgor are normal, no rashes or significant lesions EYES: normal, Conjunctiva are pink and non-injected, sclera clear  NECK: supple, thyroid normal size, non-tender, without nodularity LYMPH:  no palpable lymphadenopathy in the cervical, axillary  LUNGS: clear to auscultation and percussion with normal breathing effort HEART: regular rate & rhythm and no murmurs and no lower extremity edema ABDOMEN:abdomen soft, non-tender and normal bowel sounds Musculoskeletal:no cyanosis of digits and no clubbing  NEURO: alert & oriented x 3 with fluent speech, no focal motor/sensory deficits  LABORATORY DATA:  I have reviewed the data as listed CBC Latest Ref Rng & Units 07/06/2019 06/21/2019 06/15/2019  WBC  4.0 - 10.5 K/uL 6.0 1.2(L) 4.2  Hemoglobin 12.0 - 15.0 g/dL 9.6(L) 9.4(L) 9.5(L)  Hematocrit 36.0 - 46.0 % 29.8(L) 29.0(L) 29.7(L)  Platelets 150 - 400 K/uL 390 200 197     CMP Latest Ref Rng & Units 07/06/2019 06/21/2019 06/15/2019  Glucose 70 - 99 mg/dL 120(H) 116(H) 115(H)  BUN 6 - 20 mg/dL 21(H) 23(H) 20  Creatinine 0.44 - 1.00 mg/dL 0.98 1.03(H) 1.08(H)  Sodium 135 - 145 mmol/L 139 141 139  Potassium 3.5 - 5.1 mmol/L 3.6 3.7 3.7  Chloride 98 - 111 mmol/L 102 104 104  CO2 22 - 32 mmol/L 27 25 26   Calcium 8.9 - 10.3 mg/dL 9.1 9.7 9.6  Total Protein 6.5 - 8.1 g/dL 7.4 7.7 7.6  Total Bilirubin 0.3 - 1.2 mg/dL 1.1 0.5 0.6  Alkaline Phos 38 - 126 U/L 158(H) 231(H) 250(H)  AST 15 - 41 U/L 41 62(H) 42(H)  ALT 0 - 44 U/L 31 51(H) 31      RADIOGRAPHIC STUDIES: I have personally reviewed the radiological images as listed and agreed with the findings in the report. No  results found.   ASSESSMENT & PLAN:  MA MUNOZ is a 58 y.o. female with   1. 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 Her first line FOLFIRINOX was reduced to maintenance 5FU and liposomal irinotecan due to neutropenia and cytopenias. She progressed on this based on 08/2018 PET scan.  -She mildly progressed on second-line Gemcitabine and Abraxane in liver and new lung nodule.  -She is currently on third-line GTX with Xeloda 104m BID 2 weeks on/1 week off and Docetaxel and Gemcitabine on day 4 and 11 q3weeks starting 04/10/19. -I personally reviewed and discussed her CT CAP images from 07/03/19 with pt and her husband which shows overall stable disease with no new or acute findings.  -I discussed we will likely continue her current regimen for as long as this controls her disease and she can tolerate. I also discussed looking into clinical trail [facility] the [facility] She is interested, I will refer her.  -Due to pancytopenia her day 11 chemo has been held for the past two cycles. I will add Granix injections in her third week to help her maintain blood counts long enough to complete 2 weeks of treatment. She is agreeable. I will also slightly reduce her Xeloda dose  -Labs reviewed and adequate to proceed with C5D4 Gemcitabine/docetaxol today.  -Continue Xeloda she started this cycle on 11/9. Given her recent neutropenia I recommend her to reduce her Xeloda to 10071min the AM and 50087mn the PM. She is agreeable.  -F/u in 3 weeks   2. Intermittent fever spikes, abdominal pain -she presented to ED for this on 8/16. CT showed stable disease. Work up essentially negative -fever spikes could be related to tumor, chemo, or infection although no evidence of that on CT -continue tramadol PRN for pain, has not needed recently, no fever recently   3. Weight lossandmalnutrition -Due to chemo and  underlying metastatic cancer. -Continue nutritional supplement and f/u with dietician. -Appetite and eating much improved.Weight continues to be maintained above 100 pounds. Her goal is 110.   4. Transaminitis -Due to underlying chemo and malignancy  5. Pancytopenia, secondary to chemo -Required chemo dose reductions.  -S/p blood transfusion as needed with Hg <8. Last on 09/20/18 -OnPromacta, willcontinue. Due to worsening thrombocytopenia from chemo, increased to 22m46mily  starting 05/31/19.   6. Hypokalemia, CKD Stage III -Sheis currently on KCL supplements -overall stable   7.Peripheral neuropathy, grade 2  -Currently on Neurontin 200 mg at night and B complex. She used ice bags during infusion.  -Mild and stable in hands and has mildly progressed in feet with current chemo. Will continue to monitor.   8.H/o Acute cholecystitis, status post cholecystectomy tube placement 4/23per IR -She was seen by surgery during herhospitalization, surgery was not recommended due to high risk of complication, and concerns for cancer progression when holding chemotherapy for surgery -Drain intact,currently has decreased to 11m slightlybloody drainage per day -afebrileand no chills. -Her CT CAP from 12/14/18 shows she has numerus gallstones. Will monitor for any inflammation. -Imaging in 12/2018 consistent with cholecystitis with cystic duct obstruction. No leak on HIDA. Holding intervention for now given she is asymptomatic and invasive procedure would delay chemo.  9. LE DVT -Diagnosed on December 15, 2018, when she was hospitalized for cholecystitis -Due to her bloody biliary drainage, and moderate thrombocytopenia from chemotherapy, she is on low-doseXarelto144mdaily (no loading dose), she is tolerating well and will continue   10.Goal of care discussion -She is full code now -She understands her cancer is incurable at this stage, and the goal of therapy is palliative, to  prolong his life, and prevent cancer related symptoms   11.Vertigo -continueMeclizine as needed -Willmonitor. If this worsens I recommend she see ENT.   Plan -I refilled Promacta today -CT CAP reviewed, Stable disease -Labs reviewed and adequate to continue C5D4 Gemcitabine/Docetaxel today  -Add Granix injection in day 17 and 19 of this cycle  -She started C5 Xeloda on 11/9. Starting today she will reduce Xeloda to 100034mn the AM and 500m74m the PM to complete 14 days course  -lab, flush and chemo Gemcitabine/taxol in 1, 3, 4, 6, 7 weeks  -F/u in 3 and 6 weeks    No problem-specific Assessment & Plan notes found for this encounter.   No orders of the defined types were placed in this encounter.  All questions were answered. The patient knows to call the clinic with any problems, questions or concerns. No barriers to learning was detected. I spent 30 minutes counseling the patient face to face. The total time spent in the appointment was 40 minutes and more than 50% was on counseling and review of test results     Maniya Donovan [name] 07/06/2019   I, AmoyJoslyn Devon acting as scribe for Etoy Mcdonnell [name].   I have reviewed the above documentation for accuracy and completeness, and I agree with the above.

## 2019-07-06 ENCOUNTER — Inpatient Hospital Stay: Payer: 59

## 2019-07-06 ENCOUNTER — Inpatient Hospital Stay: Payer: 59 | Attending: Hematology | Admitting: Hematology

## 2019-07-06 ENCOUNTER — Other Ambulatory Visit: Payer: Self-pay

## 2019-07-06 VITALS — BP 104/66 | HR 89 | Temp 98.7°F | Resp 18 | Ht 65.0 in | Wt 107.2 lb

## 2019-07-06 DIAGNOSIS — C251 Malignant neoplasm of body of pancreas: Secondary | ICD-10-CM

## 2019-07-06 DIAGNOSIS — Z5189 Encounter for other specified aftercare: Secondary | ICD-10-CM | POA: Insufficient documentation

## 2019-07-06 DIAGNOSIS — C259 Malignant neoplasm of pancreas, unspecified: Secondary | ICD-10-CM

## 2019-07-06 DIAGNOSIS — Z5111 Encounter for antineoplastic chemotherapy: Secondary | ICD-10-CM | POA: Diagnosis present

## 2019-07-06 DIAGNOSIS — C787 Secondary malignant neoplasm of liver and intrahepatic bile duct: Secondary | ICD-10-CM | POA: Diagnosis present

## 2019-07-06 DIAGNOSIS — Z7189 Other specified counseling: Secondary | ICD-10-CM

## 2019-07-06 DIAGNOSIS — D696 Thrombocytopenia, unspecified: Secondary | ICD-10-CM

## 2019-07-06 DIAGNOSIS — Z95828 Presence of other vascular implants and grafts: Secondary | ICD-10-CM

## 2019-07-06 LAB — CBC WITH DIFFERENTIAL/PLATELET
Abs Immature Granulocytes: 0.03 10*3/uL (ref 0.00–0.07)
Basophils Absolute: 0 10*3/uL (ref 0.0–0.1)
Basophils Relative: 1 %
Eosinophils Absolute: 0 10*3/uL (ref 0.0–0.5)
Eosinophils Relative: 1 %
HCT: 29.8 % — ABNORMAL LOW (ref 36.0–46.0)
Hemoglobin: 9.6 g/dL — ABNORMAL LOW (ref 12.0–15.0)
Immature Granulocytes: 1 %
Lymphocytes Relative: 5 %
Lymphs Abs: 0.3 10*3/uL — ABNORMAL LOW (ref 0.7–4.0)
MCH: 34.2 pg — ABNORMAL HIGH (ref 26.0–34.0)
MCHC: 32.2 g/dL (ref 30.0–36.0)
MCV: 106 fL — ABNORMAL HIGH (ref 80.0–100.0)
Monocytes Absolute: 1 10*3/uL (ref 0.1–1.0)
Monocytes Relative: 17 %
Neutro Abs: 4.6 10*3/uL (ref 1.7–7.7)
Neutrophils Relative %: 75 %
Platelets: 390 10*3/uL (ref 150–400)
RBC: 2.81 MIL/uL — ABNORMAL LOW (ref 3.87–5.11)
RDW: 19.1 % — ABNORMAL HIGH (ref 11.5–15.5)
WBC: 6 10*3/uL (ref 4.0–10.5)
nRBC: 0 % (ref 0.0–0.2)

## 2019-07-06 LAB — COMPREHENSIVE METABOLIC PANEL
ALT: 31 U/L (ref 0–44)
AST: 41 U/L (ref 15–41)
Albumin: 3.8 g/dL (ref 3.5–5.0)
Alkaline Phosphatase: 158 U/L — ABNORMAL HIGH (ref 38–126)
Anion gap: 10 (ref 5–15)
BUN: 21 mg/dL — ABNORMAL HIGH (ref 6–20)
CO2: 27 mmol/L (ref 22–32)
Calcium: 9.1 mg/dL (ref 8.9–10.3)
Chloride: 102 mmol/L (ref 98–111)
Creatinine, Ser: 0.98 mg/dL (ref 0.44–1.00)
GFR calc Af Amer: >60 mL/min (ref >60)
GFR calc non Af Amer: >60 mL/min (ref >60)
Glucose, Bld: 120 mg/dL — ABNORMAL HIGH (ref 70–99)
Potassium: 3.6 mmol/L (ref 3.5–5.1)
Sodium: 139 mmol/L (ref 135–145)
Total Bilirubin: 1.1 mg/dL (ref 0.3–1.2)
Total Protein: 7.4 g/dL (ref 6.5–8.1)

## 2019-07-06 MED ORDER — ELTROMBOPAG OLAMINE 50 MG PO TABS: 50.0000 mg | ORAL_TABLET | Freq: Every day | ORAL | 2 refills | Status: DC

## 2019-07-06 MED ORDER — HEPARIN SOD (PORK) LOCK FLUSH 100 UNIT/ML IV SOLN
500.0000 [IU] | Freq: Once | INTRAVENOUS | Status: AC | PRN
  Administered 2019-07-06: 500 [IU]
  Filled 2019-07-06: qty 5

## 2019-07-06 MED ORDER — SODIUM CHLORIDE 0.9% FLUSH
10.0000 mL | Freq: Once | INTRAVENOUS | Status: AC
  Administered 2019-07-06: 10 mL
  Filled 2019-07-06: qty 10

## 2019-07-06 MED ORDER — SODIUM CHLORIDE 0.9 % IV SOLN
Freq: Once | INTRAVENOUS | Status: AC
  Administered 2019-07-06: 12:00:00 via INTRAVENOUS
  Filled 2019-07-06: qty 250

## 2019-07-06 MED ORDER — SODIUM CHLORIDE 0.9% FLUSH
10.0000 mL | INTRAVENOUS | Status: DC | PRN
  Administered 2019-07-06: 10 mL
  Filled 2019-07-06: qty 10

## 2019-07-06 MED ORDER — SODIUM CHLORIDE 0.9 % IV SOLN
30.0000 mg/m2 | Freq: Once | INTRAVENOUS | Status: AC
  Administered 2019-07-06: 14:00:00 40 mg via INTRAVENOUS
  Filled 2019-07-06: qty 4

## 2019-07-06 MED ORDER — DEXAMETHASONE SODIUM PHOSPHATE 10 MG/ML IJ SOLN
10.0000 mg | Freq: Once | INTRAMUSCULAR | Status: AC
  Administered 2019-07-06: 10 mg via INTRAVENOUS

## 2019-07-06 MED ORDER — DEXAMETHASONE SODIUM PHOSPHATE 10 MG/ML IJ SOLN
INTRAMUSCULAR | Status: AC
  Filled 2019-07-06: qty 1

## 2019-07-06 MED ORDER — SODIUM CHLORIDE 0.9 % IV SOLN
600.0000 mg/m2 | Freq: Once | INTRAVENOUS | Status: AC
  Administered 2019-07-06: 912 mg via INTRAVENOUS
  Filled 2019-07-06: qty 23.99

## 2019-07-06 NOTE — Patient Instructions (Signed)
[place] Discharge Instructions for Patients Receiving Chemotherapy  Today you received the following chemotherapy agents Gemzar, Taxotere  To help prevent nausea and vomiting after your treatment, we encourage you to take your nausea medication as directed by your MD   If you develop nausea and vomiting that is not controlled by your nausea medication, call the clinic.   BELOW ARE SYMPTOMS THAT SHOULD BE REPORTED IMMEDIATELY:  *FEVER GREATER THAN 100.5 F  *CHILLS WITH OR WITHOUT FEVER  NAUSEA AND VOMITING THAT IS NOT CONTROLLED WITH YOUR NAUSEA MEDICATION  *UNUSUAL SHORTNESS OF BREATH  *UNUSUAL BRUISING OR BLEEDING  TENDERNESS IN MOUTH AND THROAT WITH OR WITHOUT PRESENCE OF ULCERS  *URINARY PROBLEMS  *BOWEL PROBLEMS  UNUSUAL RASH Items with * indicate a potential emergency and should be followed up as soon as possible.  Feel free to call the clinic should you have any questions or concerns. The clinic phone number is [phone].  Please show the Trail at check-in to the Emergency Department and triage nurse.  [facility]: [phone]

## 2019-07-07 ENCOUNTER — Telehealth: Payer: Self-pay | Admitting: Hematology

## 2019-07-07 NOTE — Telephone Encounter (Signed)
Scheduled appt per 11/12 los.  Spoke with pt and she is aware of the appt dates and time.

## 2019-07-08 ENCOUNTER — Encounter: Payer: Self-pay | Admitting: Hematology

## 2019-07-12 ENCOUNTER — Inpatient Hospital Stay: Payer: 59

## 2019-07-12 ENCOUNTER — Other Ambulatory Visit: Payer: Self-pay

## 2019-07-12 ENCOUNTER — Telehealth: Payer: Self-pay

## 2019-07-12 VITALS — BP 111/73 | HR 80 | Temp 97.8°F | Resp 16

## 2019-07-12 DIAGNOSIS — C787 Secondary malignant neoplasm of liver and intrahepatic bile duct: Secondary | ICD-10-CM

## 2019-07-12 DIAGNOSIS — C259 Malignant neoplasm of pancreas, unspecified: Secondary | ICD-10-CM

## 2019-07-12 DIAGNOSIS — C251 Malignant neoplasm of body of pancreas: Secondary | ICD-10-CM

## 2019-07-12 DIAGNOSIS — Z7189 Other specified counseling: Secondary | ICD-10-CM

## 2019-07-12 DIAGNOSIS — Z5111 Encounter for antineoplastic chemotherapy: Secondary | ICD-10-CM | POA: Diagnosis not present

## 2019-07-12 DIAGNOSIS — Z95828 Presence of other vascular implants and grafts: Secondary | ICD-10-CM

## 2019-07-12 LAB — CBC WITH DIFFERENTIAL/PLATELET
Abs Immature Granulocytes: 0 10*3/uL (ref 0.00–0.07)
Basophils Absolute: 0 10*3/uL (ref 0.0–0.1)
Basophils Relative: 2 %
Eosinophils Absolute: 0 10*3/uL (ref 0.0–0.5)
Eosinophils Relative: 4 %
HCT: 28.6 % — ABNORMAL LOW (ref 36.0–46.0)
Hemoglobin: 9.2 g/dL — ABNORMAL LOW (ref 12.0–15.0)
Immature Granulocytes: 0 %
Lymphocytes Relative: 30 %
Lymphs Abs: 0.3 10*3/uL — ABNORMAL LOW (ref 0.7–4.0)
MCH: 33.6 pg (ref 26.0–34.0)
MCHC: 32.2 g/dL (ref 30.0–36.0)
MCV: 104.4 fL — ABNORMAL HIGH (ref 80.0–100.0)
Monocytes Absolute: 0.1 10*3/uL (ref 0.1–1.0)
Monocytes Relative: 10 %
Neutro Abs: 0.6 10*3/uL — ABNORMAL LOW (ref 1.7–7.7)
Neutrophils Relative %: 54 %
Platelets: 225 10*3/uL (ref 150–400)
RBC: 2.74 MIL/uL — ABNORMAL LOW (ref 3.87–5.11)
RDW: 18.6 % — ABNORMAL HIGH (ref 11.5–15.5)
WBC: 1.1 10*3/uL — ABNORMAL LOW (ref 4.0–10.5)
nRBC: 0 % (ref 0.0–0.2)

## 2019-07-12 LAB — COMPREHENSIVE METABOLIC PANEL
ALT: 28 U/L (ref 0–44)
AST: 40 U/L (ref 15–41)
Albumin: 3.7 g/dL (ref 3.5–5.0)
Alkaline Phosphatase: 206 U/L — ABNORMAL HIGH (ref 38–126)
Anion gap: 12 (ref 5–15)
BUN: 21 mg/dL — ABNORMAL HIGH (ref 6–20)
CO2: 25 mmol/L (ref 22–32)
Calcium: 9.6 mg/dL (ref 8.9–10.3)
Chloride: 103 mmol/L (ref 98–111)
Creatinine, Ser: 1.04 mg/dL — ABNORMAL HIGH (ref 0.44–1.00)
GFR calc Af Amer: >60 mL/min (ref >60)
GFR calc non Af Amer: >60 mL/min (ref >60)
Glucose, Bld: 115 mg/dL — ABNORMAL HIGH (ref 70–99)
Potassium: 3.8 mmol/L (ref 3.5–5.1)
Sodium: 140 mmol/L (ref 135–145)
Total Bilirubin: 0.6 mg/dL (ref 0.3–1.2)
Total Protein: 7.5 g/dL (ref 6.5–8.1)

## 2019-07-12 MED ORDER — SODIUM CHLORIDE 0.9% FLUSH
10.0000 mL | INTRAVENOUS | Status: DC | PRN
  Administered 2019-07-12: 10 mL
  Filled 2019-07-12: qty 10

## 2019-07-12 MED ORDER — SODIUM CHLORIDE 0.9 % IV SOLN
30.0000 mg/m2 | Freq: Once | INTRAVENOUS | Status: AC
  Administered 2019-07-12: 40 mg via INTRAVENOUS
  Filled 2019-07-12: qty 4

## 2019-07-12 MED ORDER — DEXAMETHASONE SODIUM PHOSPHATE 10 MG/ML IJ SOLN
INTRAMUSCULAR | Status: AC
  Filled 2019-07-12: qty 1

## 2019-07-12 MED ORDER — DEXAMETHASONE SODIUM PHOSPHATE 10 MG/ML IJ SOLN
10.0000 mg | Freq: Once | INTRAMUSCULAR | Status: AC
  Administered 2019-07-12: 10 mg via INTRAVENOUS

## 2019-07-12 MED ORDER — SODIUM CHLORIDE 0.9 % IV SOLN
600.0000 mg/m2 | Freq: Once | INTRAVENOUS | Status: AC
  Administered 2019-07-12: 912 mg via INTRAVENOUS
  Filled 2019-07-12: qty 23.99

## 2019-07-12 MED ORDER — HEPARIN SOD (PORK) LOCK FLUSH 100 UNIT/ML IV SOLN
500.0000 [IU] | Freq: Once | INTRAVENOUS | Status: AC | PRN
  Administered 2019-07-12: 500 [IU]
  Filled 2019-07-12: qty 5

## 2019-07-12 MED ORDER — SODIUM CHLORIDE 0.9% FLUSH
10.0000 mL | Freq: Once | INTRAVENOUS | Status: AC
  Administered 2019-07-12: 11:00:00 10 mL
  Filled 2019-07-12: qty 10

## 2019-07-12 MED ORDER — HEPARIN SOD (PORK) LOCK FLUSH 100 UNIT/ML IV SOLN
500.0000 [IU] | Freq: Once | INTRAVENOUS | Status: DC
  Filled 2019-07-12: qty 5

## 2019-07-12 MED ORDER — SODIUM CHLORIDE 0.9 % IV SOLN
Freq: Once | INTRAVENOUS | Status: AC
  Administered 2019-07-12: 12:00:00 via INTRAVENOUS
  Filled 2019-07-12: qty 250

## 2019-07-12 NOTE — Patient Instructions (Signed)
[facility] Discharge Instructions for Patients Receiving Chemotherapy  Today you received the following chemotherapy agents: gemcitabine and docetaxel.  To help prevent nausea and vomiting after your treatment, we encourage you to take your nausea medication as directed.   If you develop nausea and vomiting that is not controlled by your nausea medication, call the clinic.   BELOW ARE SYMPTOMS THAT SHOULD BE REPORTED IMMEDIATELY:  *FEVER GREATER THAN 100.5 F  *CHILLS WITH OR WITHOUT FEVER  NAUSEA AND VOMITING THAT IS NOT CONTROLLED WITH YOUR NAUSEA MEDICATION  *UNUSUAL SHORTNESS OF BREATH  *UNUSUAL BRUISING OR BLEEDING  TENDERNESS IN MOUTH AND THROAT WITH OR WITHOUT PRESENCE OF ULCERS  *URINARY PROBLEMS  *BOWEL PROBLEMS  UNUSUAL RASH Items with * indicate a potential emergency and should be followed up as soon as possible.  Feel free to call the clinic should you have any questions or concerns. The clinic phone number is [phone].  Please show the CHEMO ALERT CARD at check-in to the Emergency Department and triage nurse.   

## 2019-07-12 NOTE — Progress Notes (Signed)
Patient states that approximately 36 hours after her last treatment, she experienced low grade fever (TMax - 100.8) and abdominal cramping that resolved within 24 hours of onset. States she has not experienced that again since resolution. Dr. Burr Medico aware and advised to monitor. Per Dr. Burr Medico, ok to treat with ANC 0.6.

## 2019-07-12 NOTE — Telephone Encounter (Signed)
Faxed back medication clarification for Pomalyst request to [facility], provided diagnosis code, sent to HIM for scan to chart.

## 2019-07-17 NOTE — Progress Notes (Signed)
[place]   [phone] [phone]   Clinic Follow up Note   Patient Care Team: [name], MD as PCP - General (Family Medicine) [name], MD as Consulting Physician (Otolaryngology) [name], MD as Consulting Physician (Oncology)  Date of Service:  07/26/2019  CHIEF COMPLAINT: F/u of pancreatic cancer  SUMMARY OF ONCOLOGIC HISTORY: Oncology History Overview Note  Cancer Staging Pancreatic cancer [facility]) Staging form: Exocrine Pancreas, AJCC 8th Edition - Clinical stage from 08/06/2017: Stage IV (cTX, cN0, pM1) - Signed by [name], MD on 08/12/2017     Pancreatic cancer ([place])  07/23/2017 Imaging   US abdomen limited RUQ 07/23/17 IMPRESSION: 1. Cholelithiasis.  No secondary signs of acute cholecystitis. 2. Multiple solid liver masses measuring up to 6.5 cm in the left lobe of the liver, evaluation for metastatic disease is recommended. These results will be called to the ordering clinician or representative by the Radiologist Assistant, and communication documented in the PACS or zVision Dashboard.   07/23/2017 Imaging   CT Abdomen W Contrast 07/23/17 IMPRESSION: 1. Widespread metastatic disease throughout the liver. No clear primary malignancy identified in the abdomen. The pelvis was not imaged. Tissue sampling recommended. 2. Probable adenopathy superior to the pancreatic tail. No evidence of pancreatic mass. 3. Suspected incidental hemangioma inferiorly in the right hepatic lobe. 4. Nonspecific nodularity in the breasts. The patient has undergone recent (03/25/2017 and 04/01/2017) mammography and ultrasound.   07/29/2017 Initial Diagnosis   Metastasis to liver of unknown origin ([place])   07/29/2017 PET scan   PET 07/29/17  IMPRESSION: 1. Numerous bulky liver masses are hypermetabolic compatible with malignancy. 2. Accentuated activity within or along the pancreatic tail, likely represent a primary pancreatic tumor.  Consider pancreatic protocol MRI to further work this up. 3. The peripancreatic lymph node shown above the pancreatic tail is mildly hypermetabolic favoring malignancy. 4.  Prominent stool throughout the colon favors constipation. 5. 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   08/05/2017 Pathology Results   Liver Biopsy  Diagnosis 08/05/17 Liver, needle/core biopsy - CARCINOMA. - SEE COMMENT. Microscopic Comment The malignant cells are positive for cytokeratin 7. They are negative for arginase, CDX2, cytokeratin 20, estrogen receptor, GATA-3, GCDFP, Glypican 3, Hep Par 1, Napsin A, and TTF-1. This immunohistochemical is nonspecific. Possibly primary sources include pancreatobiliary and upper gastrointestinal. Radiologic correlation is necessary. Of note, organ specific markers (GATA-3, GCDFP-breast, TTF-1, Napsin A-lung, and CDX2-colon) are negative. (JBK:ecj 08/09/2017)   08/21/2017 - 02/10/2018 Chemotherapy   FOLFIRINOX every 2 weeks starting 08/21/17. Dose reduced due to neuropahty and cytopenia    10/14/2017 Imaging   CT CAP WO Contrast 10/14/17 IMPRESSION: Evidence of known numerous liver metastases without significant interval change. No other evidence of metastatic disease within the chest, abdomen or pelvis. Cholelithiasis. Tiny pericardial effusion.   12/02/2017 Genetic Testing   Negative for pathogenic mutation.  The genes analyzed were the 83 genes on [facility]'s Multi-Cancer panel (ALK, APC, ATM, AXIN2, BAP1, BARD1, BLM, BMPR1A, BRCA1, BRCA2, BRIP1, CASR, CDC73, CDH1, CDK4, CDKN1B, CDKN1C, CDKN2A, CEBPA, CHEK2, CTNNA1, DICER1, DIS3L2, EGFR, EPCAM, FH, FLCN, GATA2, GPC3, GREM1, HOXB13, HRAS, KIT, MAX, MEN1, MET, MITF, MLH1, MSH2, MSH3, MSH6, MUTYH, NBN, NF1, NF2, NTHL1, PALB2, PDGFRA, PHOX2B, PMS2, POLD1, POLE, POT1, PRKAR1A, PTCH1, PTEN, RAD50, RAD51C, RAD51D, RB1, RECQL4, RET, RUNX1, SDHA, SDHAF2, SDHB, SDHC, SDHD, SMAD4, SMARCA4, SMARCB1, SMARCE1, STK11, SUFU, TERC, TERT,  TMEM127, TP53, TSC1, TSC2, VHL, WRN, WT1).   12/17/2017 PET scan   IMPRESSION: 1. Although the liver metastases  are still visible on the CT images, they are significantly smaller and retain no abnormal metabolic activity, consistent with response to therapy. 2. No abnormal activity within the pancreas. 3. No disease progression identified. 4. Cholelithiasis.   02/21/2018 PET scan   02/21/2018 PET Scan  IMPRESSION: 1. There are 2 new small nodules identified within the right lung which measure up to 5 mm. These are too small to reliably characterize by PET-CT, but warrant close interval follow-up. 2. Again noted are multifocal liver metastasis. The target lesion within the left lobe is slightly decreased in size when compared with the previous exam. Similar to previous exam there is no abnormal hypermetabolism above background liver activity identified within the liver lesions. 3. Gallstones.   02/23/2018 - 09/04/2018 Chemotherapy   5-FU pump infusion and liposomal irinotecan, every 2 weeks.  First cycle dose reduced due to cytopenia in the travel. Stopped on 08/25/2018 due to disease progression.    05/17/2018 Imaging   05/17/2018 PET Scan IMPRESSION: 1. Mixed response. The 2 small right lung nodules have decreased in size in the interval. Single focus of increased uptake within the liver is new from 02/21/2018 and concerning for recurrent metabolically active liver metastasis. 2. Splenomegaly.  New from previous exam. 3. Diffusely increased bone marrow activity, likely reflecting treatment related changes.   09/01/2018 PET scan   PET 09/01/18 IMPRESSION: 1. Unfortunately there recurrence of hepatic metastasis with multiple new hypermetabolic lesions in LEFT and RIGHT hepatic lobe. 2. New extra hepatic site of malignancy which appears associated the mid pancreas. Difficult to define lesion on noncontrast exam. 3. Diffuse marrow activity is favored benign. 4. No evidence of pulmonary  metastasis.   09/12/2018 - 03/27/2019 Chemotherapy   second-line Gemcitabine and Abraxane for 2 weeks on, 1 week off starting 09/12/2018. Due to neuropathy, I will start her with dose reduced Abraxane. Stopped after 03/27/19 due to disease progression.    12/14/2018 Imaging   CT CAP  IMPRESSION: CT demonstrates acute cholecystitis, with choledocholithiasis of the cystic duct.  These results were discussed by telephone at the time of interpretation on 12/14/2018 at 5:05 pm with Dr. Burr Medico.  Redemonstration of known liver metastases, with positive response to therapy, interval reduction in size of all lesions, with multiple demonstrating significant internal necrosis.  No acute finding of the chest.  Ancillary findings as above.   03/21/2019 Imaging   CT CAP W Contrast IMPRESSION: 1. Numerous rim enhancing liver metastases show overall trend towards mild progression although 1 of the index lesions is stable in the interval. 2. Interval development of portocaval lymphadenopathy concerning for disease progression. 3. Slight progression of main duct dilatation in the pancreas with abrupt cut off in the region of the body. 4. Interval development of a 9 mm subpleural nodule at the right base along the posterior hemidiaphragm. Close attention on follow-up recommended as metastatic disease not excluded. 5. New splenomegaly. 6. Cholelithiasis with diffuse gallbladder wall thickening. Gallbladder wall thickening is decreased in the interval. Stones and sludge noted in the gallbladder lumen.   04/09/2019 Imaging   CT AP W Contrast 04/09/19  IMPRESSION: 1. Multiple gallstones in lumen gallbladder without evidence acute cholecystitis. No significant change from prior. No biliary duct dilatation. Common bile duct normal. 2. Widespread hepatic metastasis unchanged from comparison CT. 3. Chronic pancreatic duct dilatation in the body and tail unchanged. 4. No bowel obstruction. 5. Small  amount of intraperitoneal free fluid unchanged.   04/10/2019 -  Chemotherapy   Third-line GTX with Xeloda  $'1000mg'r$  BID day 1-14, off for 1 week with Docetaxel and Gemcitbine on day 4 and day 11 every 3 weeks starting oral chemo on 04/10/19.    07/03/2019 Imaging   CT CAP W Contrast  IMPRESSION: 1. Overall stable diffuse hepatic metastatic disease. No new/progressive findings. 2. Stable to slightly larger periportal lymph node. 3. Stable CT appearance of the pancreas. Abrupt cut off of a dilated main pancreatic duct in the body/head junction region without obvious pancreatic lesion. 4. No pulmonary nodules are identified at the lung bases. 5. Stable mild splenomegaly. 6. No new/acute findings.     Pancreatic cancer metastasized to liver ([place])  08/11/2017 Initial Diagnosis   Pancreatic cancer metastasized to liver [facility])      CURRENT THERAPY:  Third-line GTX with Xeloda'1000mg'$ BID day 1-14, off for 1 week with Docetaxel'30mg'$ /m2and Gemcitabine'750mg'$ /m2on day 4 and day 11 every 3 weeks starting oral chemo on 04/10/19.Starting 07/06/19 she will reduce Xeloda to '1000mg'$  in the AM and '500mg'$  in the PM due to low blood counts.   INTERVAL HISTORY:  GIAVONNI CIZEK is here for a follow up and treatment. She presents to the clinic alone. She notes she had d/n/v for 1 week last week. She has recovered well. She notes she started her current cycle Xeloda on 08/22/19.  She notes she did have fever of 99.43F this past cycle.     REVIEW OF SYSTEMS:   Constitutional: Denies fevers, chills or abnormal weight loss Eyes: Denies blurriness of vision Ears, nose, mouth, throat, and face: Denies mucositis or sore throat Respiratory: Denies cough, dyspnea or wheezes Cardiovascular: Denies palpitation, chest discomfort or lower extremity swelling Gastrointestinal:  Denies nausea, heartburn or change in bowel habits Skin: Denies abnormal skin rashes Lymphatics: Denies new lymphadenopathy or easy bruising  Neurological:Denies numbness, tingling or new weaknesses Behavioral/Psych: Mood is stable, no new changes  All other systems were reviewed with the patient and are negative.  MEDICAL HISTORY:  Past Medical History:  Diagnosis Date  . Diarrhea of presumed infectious origin 01/12/2018  . Gastroenteritis 01/12/2018  . Genetic testing 12/02/2017   Multi-Cancer panel (83 genes) @ [facility] - No pathogenic mutations detected  . Meniere disease 2016  . met pancreatic ca to liver dx'd 07/2017  . Pancreatitis     SURGICAL HISTORY: Past Surgical History:  Procedure Laterality Date  . CERVICAL ABLATION    . ENDOMETRIAL ABLATION  2011  . IR CHOLANGIOGRAM EXISTING TUBE  01/09/2019  . IR FLUORO GUIDE PORT INSERTION RIGHT  08/12/2017  . IR PERC CHOLECYSTOSTOMY  12/15/2018  . IR US GUIDE VASC ACCESS RIGHT  08/12/2017  . [place]    I have reviewed the social history and family history with the patient and they are unchanged from previous note.  ALLERGIES:  has No Known Allergies.  MEDICATIONS:  Current Outpatient Medications  Medication Sig Dispense Refill  . b complex vitamins tablet Take 1 tablet by mouth daily.    . capecitabine (XELODA) 500 MG tablet TAKE 2 TABLETS BY MOUTH  TWICE DAILY WITHIN 30  MINUTES OF MEALS. TAKE ON  DAYS 1 TO 14 OF EACH 21 DAY CYCLE 56 tablet 2  . cyclobenzaprine (FLEXERIL) 5 MG tablet Take 1 tablet (5 mg total) by mouth 3 (three) times daily as needed for muscle spasms. 30 tablet 0  . eltrombopag (PROMACTA) 50 MG tablet Take 1 tablet (50 mg total) by mouth daily. Take on an empty stomach 1 hour before a meal or 2 hours after  30 tablet 2  . gabapentin (NEURONTIN) 100 MG capsule TAKE ONE CAPSULE BY MOUTH THREE TIMES DAILY (Patient taking differently: Take 100 mg by mouth 2 (two) times daily. ) 270 capsule 1  . levofloxacin (LEVAQUIN) 500 MG tablet Take 1 tablet (500 mg total) by mouth daily. 7 tablet 0  . lidocaine-prilocaine (EMLA) cream Apply 1  application topically as needed. 30 g 2  . loperamide (IMODIUM) 2 MG capsule Take 1 capsule (2 mg total) by mouth as needed for diarrhea or loose stools. 30 capsule 0  . loratadine (CLARITIN) 10 MG tablet Take 10 mg by mouth daily as needed for allergies.    . magic mouthwash w/lidocaine SOLN Take 10 mLs by mouth 3 (three) times daily as needed for mouth pain. Swish and spit 10 ML by mouth 3 times daily as needed for mouth pain. 240 mL 0  . meclizine (ANTIVERT) 25 MG tablet Take 25 mg by mouth 3 (three) times daily as needed for dizziness.    . ondansetron (ZOFRAN-ODT) 8 MG disintegrating tablet TAKE 1 TABLET BY MOUTH EVERY 8 HOURS AS NEEDED FOR NAUSE OR VOMITING 40 tablet 0  . potassium chloride (K-DUR) 10 MEQ tablet Take 1 tablet (10 mEq total) by mouth 4 (four) times daily. (Patient taking differently: Take 30 mEq by mouth daily. ) 360 tablet 2  . prochlorperazine (COMPAZINE) 10 MG tablet Take 1 tablet (10 mg total) by mouth every 6 (six) hours as needed for nausea or vomiting. 40 tablet 2  . Rivaroxaban (XARELTO) 15 MG TABS tablet Take 1 tablet (15 mg total) by mouth daily. 90 tablet 1  . traMADol (ULTRAM) 50 MG tablet Take 1 tablet (50 mg total) by mouth every 6 (six) hours as needed for moderate pain or severe pain. 30 tablet 0   No current facility-administered medications for this visit.    Facility-Administered Medications Ordered in Other Visits  Medication Dose Route Frequency Provider Last Rate Last Dose  . sodium chloride flush (NS) 0.9 % injection 10 mL  10 mL Intracatheter Once [name], MD        PHYSICAL EXAMINATION: ECOG PERFORMANCE STATUS: 2 - Symptomatic, <50% confined to bed  Vitals:   07/26/19 1306  BP: 112/71  Pulse: 85  Resp: 16  Temp: 98.5 F (36.9 C)  SpO2: 100%   Filed Weights   07/26/19 1306  Weight: 109 lb (49.4 kg)    GENERAL:alert, no distress and comfortable SKIN: skin color, texture, turgor are normal, no rashes or significant lesions EYES:  normal, Conjunctiva are pink and non-injected, sclera clear  NECK: supple, thyroid normal size, non-tender, without nodularity LYMPH:  no palpable lymphadenopathy in the cervical, axillary  LUNGS: clear to auscultation and percussion with normal breathing effort HEART: regular rate & rhythm and no murmurs and no lower extremity edema ABDOMEN:abdomen soft, non-tender and normal bowel sounds Musculoskeletal:no cyanosis of digits and no clubbing  NEURO: alert & oriented x 3 with fluent speech, no focal motor/sensory deficits  LABORATORY DATA:  I have reviewed the data as listed CBC Latest Ref Rng & Units 07/26/2019 07/21/2019 07/12/2019  WBC 4.0 - 10.5 K/uL 4.2 10.3 1.1(L)  Hemoglobin 12.0 - 15.0 g/dL 8.5(L) 9.4(L) 9.2(L)  Hematocrit 36.0 - 46.0 % 26.4(L) 28.5(L) 28.6(L)  Platelets 150 - 400 K/uL 312 82(L) 225     CMP Latest Ref Rng & Units 07/26/2019 07/21/2019 07/12/2019  Glucose 70 - 99 mg/dL 123(H) 131(H) 115(H)  BUN 6 - 20 mg/dL 12 15 21(H)  Creatinine 0.44 - 1.00 mg/dL 1.02(H) 1.34(H) 1.04(H)  Sodium 135 - 145 mmol/L 142 141 140  Potassium 3.5 - 5.1 mmol/L 3.7 3.0(LL) 3.8  Chloride 98 - 111 mmol/L 106 101 103  CO2 22 - 32 mmol/L '25 29 25  '$ Calcium 8.9 - 10.3 mg/dL 8.8(L) 8.9 9.6  Total Protein 6.5 - 8.1 g/dL 6.7 7.2 7.5  Total Bilirubin 0.3 - 1.2 mg/dL 0.5 0.8 0.6  Alkaline Phos 38 - 126 U/L 137(H) 155(H) 206(H)  AST 15 - 41 U/L 41 33 40  ALT 0 - 44 U/L '29 26 28      '$ RADIOGRAPHIC STUDIES: I have personally reviewed the radiological images as listed and agreed with the findings in the report. No results found.   ASSESSMENT & PLAN:  Morgan Dawson is a 58 y.o. female with   1. 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 Her first line FOLFIRINOX was reduced to maintenance 5FU and liposomal irinotecan due to neutropenia and cytopenias. She progressed on this based on 08/2018 PET scan. -She mildly progressed on  second-line Gemcitabine and Abraxane in liver and new lung nodule.  -She is currently on third-line GTX with Xeloda '1000mg'$  BID 2 weeks on/1 week off and Docetaxel andGemcitabineon day 4 and 11 q3weeks starting 04/10/19. Granix has been added to week 3 due to neutropenia. Starting 07/03/19 Xeloda has been decreased to '1000mg'$  in the AM and '500mg'$  in the PM due to cytopenias  -She tolerated C5 moderately well with d/n/v and was able to recover well on week off. Her only fever spiked at 99.85F. I instructed her to increase imodium if needed and try to avoid constipation.  -She started current cycle 6 Xeloda on 08/22/19. Labs reviewed, stable and adequate to proceed with C6D4 Gemcitabine/docetaxel today.  -If she experiences more d/n/v she may reduce Xeloda to '500mg'$  BID or '1000mg'$  in the AM alone.  -Will continue to look into Clinical trail for down the road, including [facility] in [place], [place].  -f/u in 3 weeks with NP Lacie    2. Intermittent fever spikes, abdominal pain -she presented to ED for this on 8/16. CT showed stable disease. Work up essentially negative -fever spikes could be related to tumor, chemo, or infection although no evidence of that on CT -Continue tramadol PRN for pain, has not needed recently, no fever recently  -Her spiked fever level has decreased, latest 99.6 with C5. Continue to monitor.   3. Weight lossandmalnutrition -Due to chemo and underlying metastatic cancer. -Continue nutritional supplement and f/u with dietician. -Appetite and eating much improved.Weight continuesto be maintained above 100 pounds. Her goal is 110.  4. Transaminitis -Due to underlying chemo and malignancy  5. Pancytopenia, secondary to chemo -Required chemo dose reductions.  -S/p blood transfusion as needed with Hg <8. Last on 09/20/18 -OnPromacta, willcontinue. Due to worsening thrombocytopenia from chemo, increased to '50mg'$  daily starting 05/31/19. -Granix has been added to third  week and Xeloda dose reduced starting 07/03/19  6. Hypokalemia, CKD Stage III -Sheis currently on KCL supplements -overall stable  7.Peripheral neuropathy, grade 2  -Currently on Neurontin 200 mg at night and B complex. She used ice bags during infusion.  -Mild and stable in hands and has mildly progressed in feet with current chemo. Will continue to monitor.  8.H/o Acute cholecystitis, status post cholecystectomy tube placement 4/23per IR -She was seen by surgery during herhospitalization, surgery was not recommended due to high risk of  complication, and concerns for cancer progression when holding chemotherapy for surgery -Drain intact,currently has decreased to 81m slightlybloody drainage per day -afebrileand no chills. -Her CT CAP from 12/14/18 shows she has numerus gallstones. Will monitor for any inflammation. -Imaging in 12/2018 consistent with cholecystitis with cystic duct obstruction. No leak on HIDA. Holding intervention for now given she is asymptomatic and invasive procedure would delay chemo.  9. LE DVT -Diagnosed on December 15, 2018, when she was hospitalized for cholecystitis -Due to her bloody biliary drainage, and moderate thrombocytopenia from chemotherapy, she is on low-doseXarelto'15mg'$  daily (no loading dose), she is tolerating well and will continue  10.Goal of care discussion -She is full code now -She understands her cancer is incurable at this stage, and the goal of therapy is palliative, to prolong his life, and prevent cancer related symptoms  11.Vertigo -continueMeclizine as needed -Willmonitor. If this worsens I recommend she see ENT.   Plan -I will check if she is able to eat Grapefruit  -Continue C4 Xeloda at reduced '1000mg'$  in the AM and '500mg'$  in the PM, can dose reduce by 1 pill/day if needed -Labs reviewed and adequate to proceed with C6D4 Gemcitabine/Docetaxel today, D11 chemo next week  -Add Granix injection on 12/14 -F/u  with NP Laice in 3 weeks     No problem-specific Assessment & Plan notes found for this encounter.   No orders of the defined types were placed in this encounter.  All questions were answered. The patient knows to call the clinic with any problems, questions or concerns. No barriers to learning was detected. I spent 20 minutes counseling the patient face to face. The total time spent in the appointment was 25 minutes and more than 50% was on counseling and review of test results     YTruitt Merle MD 07/26/2019   I, AJoslyn Devon am acting as scribe for YTruitt Merle MD.   I have reviewed the above documentation for accuracy and completeness, and I agree with the above.

## 2019-07-18 LAB — CANCER ANTIGEN 19-9: CA 19-9: 58 U/mL — ABNORMAL HIGH (ref 0–35)

## 2019-07-19 ENCOUNTER — Other Ambulatory Visit: Payer: Self-pay | Admitting: Nurse Practitioner

## 2019-07-19 ENCOUNTER — Inpatient Hospital Stay: Payer: 59

## 2019-07-19 ENCOUNTER — Other Ambulatory Visit: Payer: Self-pay

## 2019-07-19 VITALS — BP 130/68 | HR 89 | Temp 98.3°F | Resp 16

## 2019-07-19 DIAGNOSIS — C787 Secondary malignant neoplasm of liver and intrahepatic bile duct: Secondary | ICD-10-CM

## 2019-07-19 DIAGNOSIS — C259 Malignant neoplasm of pancreas, unspecified: Secondary | ICD-10-CM

## 2019-07-19 DIAGNOSIS — Z95828 Presence of other vascular implants and grafts: Secondary | ICD-10-CM

## 2019-07-19 DIAGNOSIS — Z5111 Encounter for antineoplastic chemotherapy: Secondary | ICD-10-CM | POA: Diagnosis not present

## 2019-07-19 MED ORDER — FILGRASTIM-SNDZ 300 MCG/0.5ML IJ SOSY
300.0000 ug | PREFILLED_SYRINGE | Freq: Once | INTRAMUSCULAR | Status: AC
  Administered 2019-07-19: 300 ug via SUBCUTANEOUS

## 2019-07-19 MED ORDER — FILGRASTIM-SNDZ 300 MCG/0.5ML IJ SOSY
PREFILLED_SYRINGE | INTRAMUSCULAR | Status: AC
  Filled 2019-07-19: qty 0.5

## 2019-07-19 NOTE — Patient Instructions (Signed)

## 2019-07-21 ENCOUNTER — Inpatient Hospital Stay: Payer: 59

## 2019-07-21 ENCOUNTER — Other Ambulatory Visit: Payer: Self-pay

## 2019-07-21 ENCOUNTER — Telehealth: Payer: Self-pay

## 2019-07-21 ENCOUNTER — Telehealth: Payer: Self-pay | Admitting: *Deleted

## 2019-07-21 VITALS — BP 128/70

## 2019-07-21 DIAGNOSIS — C787 Secondary malignant neoplasm of liver and intrahepatic bile duct: Secondary | ICD-10-CM

## 2019-07-21 DIAGNOSIS — Z95828 Presence of other vascular implants and grafts: Secondary | ICD-10-CM

## 2019-07-21 DIAGNOSIS — Z5111 Encounter for antineoplastic chemotherapy: Secondary | ICD-10-CM | POA: Diagnosis not present

## 2019-07-21 DIAGNOSIS — C251 Malignant neoplasm of body of pancreas: Secondary | ICD-10-CM

## 2019-07-21 DIAGNOSIS — C259 Malignant neoplasm of pancreas, unspecified: Secondary | ICD-10-CM

## 2019-07-21 LAB — COMPREHENSIVE METABOLIC PANEL
ALT: 26 U/L (ref 0–44)
AST: 33 U/L (ref 15–41)
Albumin: 3.8 g/dL (ref 3.5–5.0)
Alkaline Phosphatase: 155 U/L — ABNORMAL HIGH (ref 38–126)
Anion gap: 11 (ref 5–15)
BUN: 15 mg/dL (ref 6–20)
CO2: 29 mmol/L (ref 22–32)
Calcium: 8.9 mg/dL (ref 8.9–10.3)
Chloride: 101 mmol/L (ref 98–111)
Creatinine, Ser: 1.34 mg/dL — ABNORMAL HIGH (ref 0.44–1.00)
GFR calc Af Amer: 51 mL/min — ABNORMAL LOW (ref >60)
GFR calc non Af Amer: 44 mL/min — ABNORMAL LOW (ref >60)
Glucose, Bld: 131 mg/dL — ABNORMAL HIGH (ref 70–99)
Potassium: 3 mmol/L — CL (ref 3.5–5.1)
Sodium: 141 mmol/L (ref 135–145)
Total Bilirubin: 0.8 mg/dL (ref 0.3–1.2)
Total Protein: 7.2 g/dL (ref 6.5–8.1)

## 2019-07-21 LAB — CBC WITH DIFFERENTIAL/PLATELET
Abs Immature Granulocytes: 0.2 10*3/uL — ABNORMAL HIGH (ref 0.00–0.07)
Basophils Absolute: 0 10*3/uL (ref 0.0–0.1)
Basophils Relative: 0 %
Eosinophils Absolute: 0.1 10*3/uL (ref 0.0–0.5)
Eosinophils Relative: 1 %
HCT: 28.5 % — ABNORMAL LOW (ref 36.0–46.0)
Hemoglobin: 9.4 g/dL — ABNORMAL LOW (ref 12.0–15.0)
Immature Granulocytes: 2 %
Lymphocytes Relative: 7 %
Lymphs Abs: 0.7 10*3/uL (ref 0.7–4.0)
MCH: 33.7 pg (ref 26.0–34.0)
MCHC: 33 g/dL (ref 30.0–36.0)
MCV: 102.2 fL — ABNORMAL HIGH (ref 80.0–100.0)
Monocytes Absolute: 1 10*3/uL (ref 0.1–1.0)
Monocytes Relative: 10 %
Neutro Abs: 8.3 10*3/uL — ABNORMAL HIGH (ref 1.7–7.7)
Neutrophils Relative %: 80 %
Platelets: 82 10*3/uL — ABNORMAL LOW (ref 150–400)
RBC: 2.79 MIL/uL — ABNORMAL LOW (ref 3.87–5.11)
RDW: 20.2 % — ABNORMAL HIGH (ref 11.5–15.5)
WBC: 10.3 10*3/uL (ref 4.0–10.5)
nRBC: 0.4 % — ABNORMAL HIGH (ref 0.0–0.2)

## 2019-07-21 MED ORDER — FILGRASTIM-SNDZ 300 MCG/0.5ML IJ SOSY: 300.0000 ug | PREFILLED_SYRINGE | Freq: Once | INTRAMUSCULAR | Status: DC

## 2019-07-21 NOTE — Telephone Encounter (Signed)
Per Dr. Benay Spice patient will not need to get Zarzio injection today, she was informed of this and verbalized an understanding, injection was cancelled.

## 2019-07-21 NOTE — Telephone Encounter (Signed)
Called pt to check on status of pt. Pt stated that she has not had any n/v/d today and able to take potassium and keep meds down. Stated, "Nausea, vomiting, and diarrhea started last Friday up until yesterday (Thursday)." Pt expressed that she's feeling better and appreciative for the call. Advised if there were any other concerns to call office.

## 2019-07-21 NOTE — Progress Notes (Signed)
07/21/19  Hold Zarxio today with ANC 8.3  T.O. Murlean Hark, LPN/Saoirse Legere Ronnald Ramp, PharmD

## 2019-07-26 ENCOUNTER — Inpatient Hospital Stay: Payer: 59

## 2019-07-26 ENCOUNTER — Other Ambulatory Visit: Payer: Self-pay

## 2019-07-26 ENCOUNTER — Inpatient Hospital Stay: Payer: 59 | Attending: Hematology

## 2019-07-26 ENCOUNTER — Inpatient Hospital Stay (HOSPITAL_BASED_OUTPATIENT_CLINIC_OR_DEPARTMENT_OTHER): Payer: 59 | Admitting: Hematology

## 2019-07-26 ENCOUNTER — Encounter: Payer: Self-pay | Admitting: Hematology

## 2019-07-26 VITALS — BP 112/71 | HR 85 | Temp 98.5°F | Resp 16 | Wt 109.0 lb

## 2019-07-26 DIAGNOSIS — C251 Malignant neoplasm of body of pancreas: Secondary | ICD-10-CM

## 2019-07-26 DIAGNOSIS — C259 Malignant neoplasm of pancreas, unspecified: Secondary | ICD-10-CM | POA: Diagnosis present

## 2019-07-26 DIAGNOSIS — Z5111 Encounter for antineoplastic chemotherapy: Secondary | ICD-10-CM | POA: Diagnosis present

## 2019-07-26 DIAGNOSIS — Z7189 Other specified counseling: Secondary | ICD-10-CM

## 2019-07-26 DIAGNOSIS — C787 Secondary malignant neoplasm of liver and intrahepatic bile duct: Secondary | ICD-10-CM

## 2019-07-26 DIAGNOSIS — Z5189 Encounter for other specified aftercare: Secondary | ICD-10-CM | POA: Diagnosis not present

## 2019-07-26 DIAGNOSIS — Z95828 Presence of other vascular implants and grafts: Secondary | ICD-10-CM

## 2019-07-26 LAB — CBC WITH DIFFERENTIAL/PLATELET
Abs Immature Granulocytes: 0.28 10*3/uL — ABNORMAL HIGH (ref 0.00–0.07)
Basophils Absolute: 0 10*3/uL (ref 0.0–0.1)
Basophils Relative: 1 %
Eosinophils Absolute: 0.1 10*3/uL (ref 0.0–0.5)
Eosinophils Relative: 2 %
HCT: 26.4 % — ABNORMAL LOW (ref 36.0–46.0)
Hemoglobin: 8.5 g/dL — ABNORMAL LOW (ref 12.0–15.0)
Immature Granulocytes: 7 %
Lymphocytes Relative: 12 %
Lymphs Abs: 0.5 10*3/uL — ABNORMAL LOW (ref 0.7–4.0)
MCH: 33.9 pg (ref 26.0–34.0)
MCHC: 32.2 g/dL (ref 30.0–36.0)
MCV: 105.2 fL — ABNORMAL HIGH (ref 80.0–100.0)
Monocytes Absolute: 0.7 10*3/uL (ref 0.1–1.0)
Monocytes Relative: 16 %
Neutro Abs: 2.7 10*3/uL (ref 1.7–7.7)
Neutrophils Relative %: 62 %
Platelets: 312 10*3/uL (ref 150–400)
RBC: 2.51 MIL/uL — ABNORMAL LOW (ref 3.87–5.11)
RDW: 20.7 % — ABNORMAL HIGH (ref 11.5–15.5)
WBC: 4.2 10*3/uL (ref 4.0–10.5)
nRBC: 0 % (ref 0.0–0.2)

## 2019-07-26 LAB — COMPREHENSIVE METABOLIC PANEL
ALT: 29 U/L (ref 0–44)
AST: 41 U/L (ref 15–41)
Albumin: 3.6 g/dL (ref 3.5–5.0)
Alkaline Phosphatase: 137 U/L — ABNORMAL HIGH (ref 38–126)
Anion gap: 11 (ref 5–15)
BUN: 12 mg/dL (ref 6–20)
CO2: 25 mmol/L (ref 22–32)
Calcium: 8.8 mg/dL — ABNORMAL LOW (ref 8.9–10.3)
Chloride: 106 mmol/L (ref 98–111)
Creatinine, Ser: 1.02 mg/dL — ABNORMAL HIGH (ref 0.44–1.00)
GFR calc Af Amer: >60 mL/min (ref >60)
GFR calc non Af Amer: >60 mL/min (ref >60)
Glucose, Bld: 123 mg/dL — ABNORMAL HIGH (ref 70–99)
Potassium: 3.7 mmol/L (ref 3.5–5.1)
Sodium: 142 mmol/L (ref 135–145)
Total Bilirubin: 0.5 mg/dL (ref 0.3–1.2)
Total Protein: 6.7 g/dL (ref 6.5–8.1)

## 2019-07-26 MED ORDER — SODIUM CHLORIDE 0.9 % IV SOLN
Freq: Once | INTRAVENOUS | Status: AC
  Administered 2019-07-26: 14:00:00 via INTRAVENOUS
  Filled 2019-07-26: qty 250

## 2019-07-26 MED ORDER — HEPARIN SOD (PORK) LOCK FLUSH 100 UNIT/ML IV SOLN
500.0000 [IU] | Freq: Once | INTRAVENOUS | Status: AC | PRN
  Administered 2019-07-26: 500 [IU]
  Filled 2019-07-26: qty 5

## 2019-07-26 MED ORDER — SODIUM CHLORIDE 0.9 % IV SOLN
600.0000 mg/m2 | Freq: Once | INTRAVENOUS | Status: AC
  Administered 2019-07-26: 912 mg via INTRAVENOUS
  Filled 2019-07-26: qty 23.99

## 2019-07-26 MED ORDER — SODIUM CHLORIDE 0.9 % IV SOLN: 30.0000 mg/m2 | Freq: Once | INTRAVENOUS | Status: DC

## 2019-07-26 MED ORDER — SODIUM CHLORIDE 0.9% FLUSH
10.0000 mL | INTRAVENOUS | Status: DC | PRN
  Administered 2019-07-26: 10 mL
  Filled 2019-07-26: qty 10

## 2019-07-26 MED ORDER — DEXAMETHASONE SODIUM PHOSPHATE 10 MG/ML IJ SOLN
INTRAMUSCULAR | Status: AC
  Filled 2019-07-26: qty 1

## 2019-07-26 MED ORDER — SODIUM CHLORIDE 0.9 % IV SOLN
30.0000 mg/m2 | Freq: Once | INTRAVENOUS | Status: AC
  Administered 2019-07-26: 40 mg via INTRAVENOUS
  Filled 2019-07-26: qty 4

## 2019-07-26 MED ORDER — SODIUM CHLORIDE 0.9% FLUSH
10.0000 mL | Freq: Once | INTRAVENOUS | Status: AC
  Administered 2019-07-26: 10 mL
  Filled 2019-07-26: qty 10

## 2019-07-26 MED ORDER — DEXAMETHASONE SODIUM PHOSPHATE 10 MG/ML IJ SOLN
10.0000 mg | Freq: Once | INTRAMUSCULAR | Status: AC
  Administered 2019-07-26: 10 mg via INTRAVENOUS

## 2019-07-26 NOTE — Patient Instructions (Signed)
[facility] Discharge Instructions for Patients Receiving Chemotherapy  Today you received the following chemotherapy agents Gemcitabine(Gemzar) & Docetaxel (TAXOTERE).  To help prevent nausea and vomiting after your treatment, we encourage you to take your nausea medication as prescribed.   If you develop nausea and vomiting that is not controlled by your nausea medication, call the clinic.   BELOW ARE SYMPTOMS THAT SHOULD BE REPORTED IMMEDIATELY:  *FEVER GREATER THAN 100.5 F  *CHILLS WITH OR WITHOUT FEVER  NAUSEA AND VOMITING THAT IS NOT CONTROLLED WITH YOUR NAUSEA MEDICATION  *UNUSUAL SHORTNESS OF BREATH  *UNUSUAL BRUISING OR BLEEDING  TENDERNESS IN MOUTH AND THROAT WITH OR WITHOUT PRESENCE OF ULCERS  *URINARY PROBLEMS  *BOWEL PROBLEMS  UNUSUAL RASH Items with * indicate a potential emergency and should be followed up as soon as possible.  Feel free to call the clinic should you have any questions or concerns. The clinic phone number is [phone].  Please show the [facility] at check-in to the Emergency Department and triage nurse.  Coronavirus (COVID-19) Are you at risk?  Are you at risk for the Coronavirus (COVID-19)?  To be considered HIGH RISK for Coronavirus (COVID-19), you have to meet the following criteria:  . Traveled to [place], [place], [place], [place] or [place]; or in the [place] to [place], [place], [place], or [place]; and have fever, cough, and shortness of breath within the last 2 weeks of travel OR . Been in close contact with a person diagnosed with COVID-19 within the last 2 weeks and have fever, cough, and shortness of breath . IF YOU DO NOT MEET THESE CRITERIA, YOU ARE CONSIDERED LOW RISK FOR COVID-19.  What to do if you are HIGH RISK for COVID-19?  Marland Kitchen If you are having a medical emergency, call 911. . Seek medical care right away. Before you go to a doctor's office, urgent care or emergency department, call  ahead and tell them about your recent travel, contact with someone diagnosed with COVID-19, and your symptoms. You should receive instructions from your physician's office regarding next steps of care.  . When you arrive at healthcare provider, tell the healthcare staff immediately you have returned from visiting [place], [place], [place], [place] or [place]; or traveled in the [place] to [place], [place], [place], or [place]; in the last two weeks or you have been in close contact with a person diagnosed with COVID-19 in the last 2 weeks.   . Tell the health care staff about your symptoms: fever, cough and shortness of breath. . After you have been seen by a medical provider, you will be either: o Tested for (COVID-19) and discharged home on quarantine except to seek medical care if symptoms worsen, and asked to  - Stay home and avoid contact with others until you get your results (4-5 days)  - Avoid travel on public transportation if possible (such as bus, train, or airplane) or o Sent to the Emergency Department by EMS for evaluation, COVID-19 testing, and possible admission depending on your condition and test results.  What to do if you are LOW RISK for COVID-19?  Reduce your risk of any infection by using the same precautions used for avoiding the common cold or flu:  Marland Kitchen Wash your hands often with soap and warm water for at least 20 seconds.  If soap and water are not readily available, use an alcohol-based hand sanitizer with at least 60% alcohol.  . If  coughing or sneezing, cover your mouth and nose by coughing or sneezing into the elbow areas of your shirt or coat, into a tissue or into your sleeve (not your hands). . Avoid shaking hands with others and consider head nods or verbal greetings only. . Avoid touching your eyes, nose, or mouth with unwashed hands.  . Avoid close contact with people who are sick. . Avoid places or events with large numbers of people in one location,  like concerts or sporting events. . Carefully consider travel plans you have or are making. . If you are planning any travel outside or inside the [place], visit the CDC's Travelers' Health webpage for the latest health notices. . If you have some symptoms but not all symptoms, continue to monitor at home and seek medical attention if your symptoms worsen. . If you are having a medical emergency, call 911.   [place] / e-Visit: [email or web address]         [facility]: [facility]: [phone]                   [facility]: [phone]

## 2019-07-26 NOTE — Patient Instructions (Signed)

## 2019-07-27 ENCOUNTER — Telehealth: Payer: Self-pay | Admitting: Hematology

## 2019-07-27 NOTE — Telephone Encounter (Signed)
Scheduled appt per 12/2 los. ° ° °

## 2019-08-02 ENCOUNTER — Inpatient Hospital Stay: Payer: 59

## 2019-08-02 ENCOUNTER — Other Ambulatory Visit: Payer: Self-pay | Admitting: Family Medicine

## 2019-08-02 ENCOUNTER — Other Ambulatory Visit: Payer: Self-pay

## 2019-08-02 VITALS — BP 119/73 | HR 84 | Temp 98.5°F | Resp 20

## 2019-08-02 DIAGNOSIS — Z1231 Encounter for screening mammogram for malignant neoplasm of breast: Secondary | ICD-10-CM

## 2019-08-02 DIAGNOSIS — C251 Malignant neoplasm of body of pancreas: Secondary | ICD-10-CM

## 2019-08-02 DIAGNOSIS — Z5111 Encounter for antineoplastic chemotherapy: Secondary | ICD-10-CM | POA: Diagnosis not present

## 2019-08-02 DIAGNOSIS — C787 Secondary malignant neoplasm of liver and intrahepatic bile duct: Secondary | ICD-10-CM

## 2019-08-02 DIAGNOSIS — Z95828 Presence of other vascular implants and grafts: Secondary | ICD-10-CM

## 2019-08-02 DIAGNOSIS — Z7189 Other specified counseling: Secondary | ICD-10-CM

## 2019-08-02 DIAGNOSIS — C259 Malignant neoplasm of pancreas, unspecified: Secondary | ICD-10-CM

## 2019-08-02 LAB — CBC WITH DIFFERENTIAL/PLATELET
Abs Immature Granulocytes: 0.02 10*3/uL (ref 0.00–0.07)
Basophils Absolute: 0 10*3/uL (ref 0.0–0.1)
Basophils Relative: 2 %
Eosinophils Absolute: 0 10*3/uL (ref 0.0–0.5)
Eosinophils Relative: 1 %
HCT: 25.9 % — ABNORMAL LOW (ref 36.0–46.0)
Hemoglobin: 8.4 g/dL — ABNORMAL LOW (ref 12.0–15.0)
Immature Granulocytes: 1 %
Lymphocytes Relative: 23 %
Lymphs Abs: 0.4 10*3/uL — ABNORMAL LOW (ref 0.7–4.0)
MCH: 34.3 pg — ABNORMAL HIGH (ref 26.0–34.0)
MCHC: 32.4 g/dL (ref 30.0–36.0)
MCV: 105.7 fL — ABNORMAL HIGH (ref 80.0–100.0)
Monocytes Absolute: 0.2 10*3/uL (ref 0.1–1.0)
Monocytes Relative: 10 %
Neutro Abs: 1.1 10*3/uL — ABNORMAL LOW (ref 1.7–7.7)
Neutrophils Relative %: 63 %
Platelets: 253 10*3/uL (ref 150–400)
RBC: 2.45 MIL/uL — ABNORMAL LOW (ref 3.87–5.11)
RDW: 19.4 % — ABNORMAL HIGH (ref 11.5–15.5)
WBC: 1.7 10*3/uL — ABNORMAL LOW (ref 4.0–10.5)
nRBC: 0 % (ref 0.0–0.2)

## 2019-08-02 LAB — COMPREHENSIVE METABOLIC PANEL
ALT: 41 U/L (ref 0–44)
AST: 49 U/L — ABNORMAL HIGH (ref 15–41)
Albumin: 3.6 g/dL (ref 3.5–5.0)
Alkaline Phosphatase: 143 U/L — ABNORMAL HIGH (ref 38–126)
Anion gap: 7 (ref 5–15)
BUN: 19 mg/dL (ref 6–20)
CO2: 29 mmol/L (ref 22–32)
Calcium: 9.2 mg/dL (ref 8.9–10.3)
Chloride: 104 mmol/L (ref 98–111)
Creatinine, Ser: 0.97 mg/dL (ref 0.44–1.00)
GFR calc Af Amer: >60 mL/min (ref >60)
GFR calc non Af Amer: >60 mL/min (ref >60)
Glucose, Bld: 120 mg/dL — ABNORMAL HIGH (ref 70–99)
Potassium: 3.7 mmol/L (ref 3.5–5.1)
Sodium: 140 mmol/L (ref 135–145)
Total Bilirubin: 0.7 mg/dL (ref 0.3–1.2)
Total Protein: 7 g/dL (ref 6.5–8.1)

## 2019-08-02 MED ORDER — DEXAMETHASONE SODIUM PHOSPHATE 10 MG/ML IJ SOLN
10.0000 mg | Freq: Once | INTRAMUSCULAR | Status: AC
  Administered 2019-08-02: 12:00:00 10 mg via INTRAVENOUS

## 2019-08-02 MED ORDER — HEPARIN SOD (PORK) LOCK FLUSH 100 UNIT/ML IV SOLN
500.0000 [IU] | Freq: Once | INTRAVENOUS | Status: AC | PRN
  Administered 2019-08-02: 16:00:00 500 [IU]
  Filled 2019-08-02: qty 5

## 2019-08-02 MED ORDER — SODIUM CHLORIDE 0.9% FLUSH
10.0000 mL | INTRAVENOUS | Status: DC | PRN
  Administered 2019-08-02: 11:00:00 10 mL
  Filled 2019-08-02: qty 10

## 2019-08-02 MED ORDER — DEXAMETHASONE SODIUM PHOSPHATE 10 MG/ML IJ SOLN
INTRAMUSCULAR | Status: AC
  Filled 2019-08-02: qty 1

## 2019-08-02 MED ORDER — SODIUM CHLORIDE 0.9 % IV SOLN
30.0000 mg/m2 | Freq: Once | INTRAVENOUS | Status: AC
  Administered 2019-08-02: 15:00:00 40 mg via INTRAVENOUS
  Filled 2019-08-02: qty 4

## 2019-08-02 MED ORDER — SODIUM CHLORIDE 0.9 % IV SOLN
Freq: Once | INTRAVENOUS | Status: AC
  Administered 2019-08-02: 12:00:00 via INTRAVENOUS
  Filled 2019-08-02: qty 250

## 2019-08-02 MED ORDER — SODIUM CHLORIDE 0.9% FLUSH
10.0000 mL | INTRAVENOUS | Status: DC | PRN
  Administered 2019-08-02: 16:00:00 10 mL
  Filled 2019-08-02: qty 10

## 2019-08-02 MED ORDER — SODIUM CHLORIDE 0.9 % IV SOLN
600.0000 mg/m2 | Freq: Once | INTRAVENOUS | Status: AC
  Administered 2019-08-02: 912 mg via INTRAVENOUS
  Filled 2019-08-02: qty 23.99

## 2019-08-02 MED ORDER — HEPARIN SOD (PORK) LOCK FLUSH 100 UNIT/ML IV SOLN
500.0000 [IU] | Freq: Once | INTRAVENOUS | Status: DC | PRN
  Filled 2019-08-02: qty 5

## 2019-08-02 NOTE — Progress Notes (Signed)
Per Dr. Burr Medico OK to treat with ANC 1.1

## 2019-08-02 NOTE — Patient Instructions (Signed)
[facility] Discharge Instructions for Patients Receiving Chemotherapy  Today you received the following chemotherapy agents: Gemcitabine (Gemzar) and Taxotere (Docetaxel)  To help prevent nausea and vomiting after your treatment, we encourage you to take your nausea medication as directed.    If you develop nausea and vomiting that is not controlled by your nausea medication, call the clinic.   BELOW ARE SYMPTOMS THAT SHOULD BE REPORTED IMMEDIATELY:  *FEVER GREATER THAN 100.5 F  *CHILLS WITH OR WITHOUT FEVER  NAUSEA AND VOMITING THAT IS NOT CONTROLLED WITH YOUR NAUSEA MEDICATION  *UNUSUAL SHORTNESS OF BREATH  *UNUSUAL BRUISING OR BLEEDING  TENDERNESS IN MOUTH AND THROAT WITH OR WITHOUT PRESENCE OF ULCERS  *URINARY PROBLEMS  *BOWEL PROBLEMS  UNUSUAL RASH Items with * indicate a potential emergency and should be followed up as soon as possible.  Feel free to call the clinic should you have any questions or concerns. The clinic phone number is [phone].  Please show the [place] at check-in to the Emergency Department and triage nurse.  Coronavirus (COVID-19) Are you at risk?  Are you at risk for the Coronavirus (COVID-19)?  To be considered HIGH RISK for Coronavirus (COVID-19), you have to meet the following criteria:  . Traveled to [place], [place], [place], [place] or [place]; or in the [place] to [place], [place], [place], or [place]; and have fever, cough, and shortness of breath within the last 2 weeks of travel OR . Been in close contact with a person diagnosed with COVID-19 within the last 2 weeks and have fever, cough, and shortness of breath . IF YOU DO NOT MEET THESE CRITERIA, YOU ARE CONSIDERED LOW RISK FOR COVID-19.  What to do if you are HIGH RISK for COVID-19?  Marland Kitchen If you are having a medical emergency, call 911. . Seek medical care right away. Before you go to a doctor's office, urgent care or emergency department,  call ahead and tell them about your recent travel, contact with someone diagnosed with COVID-19, and your symptoms. You should receive instructions from your physician's office regarding next steps of care.  . When you arrive at healthcare provider, tell the healthcare staff immediately you have returned from visiting [place], [place], [place], [place] or [place]; or traveled in the [place] to [place], [place], [place], or [place]; in the last two weeks or you have been in close contact with a person diagnosed with COVID-19 in the last 2 weeks.   . Tell the health care staff about your symptoms: fever, cough and shortness of breath. . After you have been seen by a medical provider, you will be either: o Tested for (COVID-19) and discharged home on quarantine except to seek medical care if symptoms worsen, and asked to  - Stay home and avoid contact with others until you get your results (4-5 days)  - Avoid travel on public transportation if possible (such as bus, train, or airplane) or o Sent to the Emergency Department by EMS for evaluation, COVID-19 testing, and possible admission depending on your condition and test results.  What to do if you are LOW RISK for COVID-19?  Reduce your risk of any infection by using the same precautions used for avoiding the common cold or flu:  Marland Kitchen Wash your hands often with soap and warm water for at least 20 seconds.  If soap and water are not readily available, use an alcohol-based hand sanitizer with at least 60% alcohol.  Marland Kitchen  If coughing or sneezing, cover your mouth and nose by coughing or sneezing into the elbow areas of your shirt or coat, into a tissue or into your sleeve (not your hands). . Avoid shaking hands with others and consider head nods or verbal greetings only. . Avoid touching your eyes, nose, or mouth with unwashed hands.  . Avoid close contact with people who are sick. . Avoid places or events with large numbers of people in one  location, like concerts or sporting events. . Carefully consider travel plans you have or are making. . If you are planning any travel outside or inside the [place], visit the CDC's Travelers' Health webpage for the latest health notices. . If you have some symptoms but not all symptoms, continue to monitor at home and seek medical attention if your symptoms worsen. . If you are having a medical emergency, call 911.   ADDITIONAL HEALTHCARE OPTIONS FOR PATIENTS  [facility] / e-Visit: [email or web address]         [facility]: [phone]  [facility]: [phone]                   [facility]: [phone]   

## 2019-08-02 NOTE — Patient Instructions (Signed)

## 2019-08-07 ENCOUNTER — Inpatient Hospital Stay: Payer: 59

## 2019-08-07 ENCOUNTER — Other Ambulatory Visit: Payer: Self-pay

## 2019-08-07 VITALS — BP 106/77 | HR 99 | Temp 98.5°F | Resp 20

## 2019-08-07 DIAGNOSIS — Z5111 Encounter for antineoplastic chemotherapy: Secondary | ICD-10-CM | POA: Diagnosis not present

## 2019-08-07 DIAGNOSIS — Z95828 Presence of other vascular implants and grafts: Secondary | ICD-10-CM

## 2019-08-07 DIAGNOSIS — C259 Malignant neoplasm of pancreas, unspecified: Secondary | ICD-10-CM

## 2019-08-07 DIAGNOSIS — C787 Secondary malignant neoplasm of liver and intrahepatic bile duct: Secondary | ICD-10-CM

## 2019-08-07 MED ORDER — FILGRASTIM-SNDZ 300 MCG/0.5ML IJ SOSY
300.0000 ug | PREFILLED_SYRINGE | Freq: Once | INTRAMUSCULAR | Status: AC
  Administered 2019-08-07: 300 ug via SUBCUTANEOUS

## 2019-08-07 MED ORDER — FILGRASTIM-SNDZ 300 MCG/0.5ML IJ SOSY
PREFILLED_SYRINGE | INTRAMUSCULAR | Status: AC
  Filled 2019-08-07: qty 0.5

## 2019-08-07 NOTE — Patient Instructions (Signed)

## 2019-08-08 ENCOUNTER — Telehealth: Payer: Self-pay | Admitting: Hematology

## 2019-08-08 ENCOUNTER — Other Ambulatory Visit: Payer: Self-pay | Admitting: Hematology

## 2019-08-08 DIAGNOSIS — C251 Malignant neoplasm of body of pancreas: Secondary | ICD-10-CM

## 2019-08-08 NOTE — Telephone Encounter (Signed)
Called pt per RN Threasa Beards Warren Lacy - r/s appts due to availability - left message for patient to see if she was interested in r/s appt from 12/30 to 1 /2- left call back number

## 2019-08-10 ENCOUNTER — Telehealth: Payer: Self-pay

## 2019-08-10 NOTE — Telephone Encounter (Signed)
Spoke with patient regarding grapefruit.  Per our pharmacy recommendation okay to consume in modest amounts. Suggested she avoid it on her treatment weeks.  She verbalized an understanding.

## 2019-08-15 NOTE — Progress Notes (Signed)
[name]   [phone] Fax:([phone]   Clinic Follow up Note   Patient Care Team: [name], MD as PCP - General (Family Medicine) [name], MD as Consulting Physician (Otolaryngology) [name], MD as Consulting Physician (Oncology) 08/16/2019  CHIEF COMPLAINT: F/u metastatic pancreatic cancer   SUMMARY OF ONCOLOGIC HISTORY: Oncology History Overview Note  Cancer Staging Pancreatic cancer [facility]) Staging form: Exocrine Pancreas, AJCC 8th Edition - Clinical stage from 08/06/2017: Stage IV (cTX, cN0, pM1) - Signed by [name], MD on 08/12/2017     Pancreatic cancer ([place])  07/23/2017 Imaging   US abdomen limited RUQ 07/23/17 IMPRESSION: 1. Cholelithiasis.  No secondary signs of acute cholecystitis. 2. Multiple solid liver masses measuring up to 6.5 cm in the left lobe of the liver, evaluation for metastatic disease is recommended. These results will be called to the ordering clinician or representative by the Radiologist Assistant, and communication documented in the PACS or zVision Dashboard.   07/23/2017 Imaging   CT Abdomen W Contrast 07/23/17 IMPRESSION: 1. Widespread metastatic disease throughout the liver. No clear primary malignancy identified in the abdomen. The pelvis was not imaged. Tissue sampling recommended. 2. Probable adenopathy superior to the pancreatic tail. No evidence of pancreatic mass. 3. Suspected incidental hemangioma inferiorly in the right hepatic lobe. 4. Nonspecific nodularity in the breasts. The patient has undergone recent (03/25/2017 and 04/01/2017) mammography and ultrasound.   07/29/2017 Initial Diagnosis   Metastasis to liver of unknown origin ([place])   07/29/2017 PET scan   PET 07/29/17  IMPRESSION: 1. Numerous bulky liver masses are hypermetabolic compatible with malignancy. 2. Accentuated activity within or along the pancreatic tail, likely represent a primary pancreatic tumor.  Consider pancreatic protocol MRI to further work this up. 3. The peripancreatic lymph node shown above the pancreatic tail is mildly hypermetabolic favoring malignancy. 4.  Prominent stool throughout the colon favors constipation. 5. 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   08/05/2017 Pathology Results   Liver Biopsy  Diagnosis 08/05/17 Liver, needle/core biopsy - CARCINOMA. - SEE COMMENT. Microscopic Comment The malignant cells are positive for cytokeratin 7. They are negative for arginase, CDX2, cytokeratin 20, estrogen receptor, GATA-3, GCDFP, Glypican 3, Hep Par 1, Napsin A, and TTF-1. This immunohistochemical is nonspecific. Possibly primary sources include pancreatobiliary and upper gastrointestinal. Radiologic correlation is necessary. Of note, organ specific markers (GATA-3, GCDFP-breast, TTF-1, Napsin A-lung, and CDX2-colon) are negative. (JBK:ecj 08/09/2017)   08/21/2017 - 02/10/2018 Chemotherapy   FOLFIRINOX every 2 weeks starting 08/21/17. Dose reduced due to neuropahty and cytopenia    10/14/2017 Imaging   CT CAP WO Contrast 10/14/17 IMPRESSION: Evidence of known numerous liver metastases without significant interval change. No other evidence of metastatic disease within the chest, abdomen or pelvis. Cholelithiasis. Tiny pericardial effusion.   12/02/2017 Genetic Testing   Negative for pathogenic mutation.  The genes analyzed were the 83 genes on [facility]'s Multi-Cancer panel (ALK, APC, ATM, AXIN2, BAP1, BARD1, BLM, BMPR1A, BRCA1, BRCA2, BRIP1, CASR, CDC73, CDH1, CDK4, CDKN1B, CDKN1C, CDKN2A, CEBPA, CHEK2, CTNNA1, DICER1, DIS3L2, EGFR, EPCAM, FH, FLCN, GATA2, GPC3, GREM1, HOXB13, HRAS, KIT, MAX, MEN1, MET, MITF, MLH1, MSH2, MSH3, MSH6, MUTYH, NBN, NF1, NF2, NTHL1, PALB2, PDGFRA, PHOX2B, PMS2, POLD1, POLE, POT1, PRKAR1A, PTCH1, PTEN, RAD50, RAD51C, RAD51D, RB1, RECQL4, RET, RUNX1, SDHA, SDHAF2, SDHB, SDHC, SDHD, SMAD4, SMARCA4, SMARCB1, SMARCE1, STK11, SUFU, TERC, TERT,  TMEM127, TP53, TSC1, TSC2, VHL, WRN, WT1).   12/17/2017 PET scan   IMPRESSION: 1. Although the liver metastases are still visible on  the CT images, they are significantly smaller and retain no abnormal metabolic activity, consistent with response to therapy. 2. No abnormal activity within the pancreas. 3. No disease progression identified. 4. Cholelithiasis.   02/21/2018 PET scan   02/21/2018 PET Scan  IMPRESSION: 1. There are 2 new small nodules identified within the right lung which measure up to 5 mm. These are too small to reliably characterize by PET-CT, but warrant close interval follow-up. 2. Again noted are multifocal liver metastasis. The target lesion within the left lobe is slightly decreased in size when compared with the previous exam. Similar to previous exam there is no abnormal hypermetabolism above background liver activity identified within the liver lesions. 3. Gallstones.   02/23/2018 - 09/04/2018 Chemotherapy   5-FU pump infusion and liposomal irinotecan, every 2 weeks.  First cycle dose reduced due to cytopenia in the travel. Stopped on 08/25/2018 due to disease progression.    05/17/2018 Imaging   05/17/2018 PET Scan IMPRESSION: 1. Mixed response. The 2 small right lung nodules have decreased in size in the interval. Single focus of increased uptake within the liver is new from 02/21/2018 and concerning for recurrent metabolically active liver metastasis. 2. Splenomegaly.  New from previous exam. 3. Diffusely increased bone marrow activity, likely reflecting treatment related changes.   09/01/2018 PET scan   PET 09/01/18 IMPRESSION: 1. Unfortunately there recurrence of hepatic metastasis with multiple new hypermetabolic lesions in LEFT and RIGHT hepatic lobe. 2. New extra hepatic site of malignancy which appears associated the mid pancreas. Difficult to define lesion on noncontrast exam. 3. Diffuse marrow activity is favored benign. 4. No evidence of pulmonary  metastasis.   09/12/2018 - 03/27/2019 Chemotherapy   second-line Gemcitabine and Abraxane for 2 weeks on, 1 week off starting 09/12/2018. Due to neuropathy, I will start her with dose reduced Abraxane. Stopped after 03/27/19 due to disease progression.    12/14/2018 Imaging   CT CAP  IMPRESSION: CT demonstrates acute cholecystitis, with choledocholithiasis of the cystic duct.  These results were discussed by telephone at the time of interpretation on 12/14/2018 at 5:05 pm with Dr. Burr Medico.  Redemonstration of known liver metastases, with positive response to therapy, interval reduction in size of all lesions, with multiple demonstrating significant internal necrosis.  No acute finding of the chest.  Ancillary findings as above.   03/21/2019 Imaging   CT CAP W Contrast IMPRESSION: 1. Numerous rim enhancing liver metastases show overall trend towards mild progression although 1 of the index lesions is stable in the interval. 2. Interval development of portocaval lymphadenopathy concerning for disease progression. 3. Slight progression of main duct dilatation in the pancreas with abrupt cut off in the region of the body. 4. Interval development of a 9 mm subpleural nodule at the right base along the posterior hemidiaphragm. Close attention on follow-up recommended as metastatic disease not excluded. 5. New splenomegaly. 6. Cholelithiasis with diffuse gallbladder wall thickening. Gallbladder wall thickening is decreased in the interval. Stones and sludge noted in the gallbladder lumen.   04/09/2019 Imaging   CT AP W Contrast 04/09/19  IMPRESSION: 1. Multiple gallstones in lumen gallbladder without evidence acute cholecystitis. No significant change from prior. No biliary duct dilatation. Common bile duct normal. 2. Widespread hepatic metastasis unchanged from comparison CT. 3. Chronic pancreatic duct dilatation in the body and tail unchanged. 4. No bowel obstruction. 5. Small  amount of intraperitoneal free fluid unchanged.   04/10/2019 -  Chemotherapy   Third-line GTX with Xeloda 1051m BID day 1-14,  off for 1 week with Docetaxel and Gemcitbine on day 4 and day 11 every 3 weeks starting oral chemo on 04/10/19.    07/03/2019 Imaging   CT CAP W Contrast  IMPRESSION: 1. Overall stable diffuse hepatic metastatic disease. No new/progressive findings. 2. Stable to slightly larger periportal lymph node. 3. Stable CT appearance of the pancreas. Abrupt cut off of a dilated main pancreatic duct in the body/head junction region without obvious pancreatic lesion. 4. No pulmonary nodules are identified at the lung bases. 5. Stable mild splenomegaly. 6. No new/acute findings.     Pancreatic cancer metastasized to liver ([place])  08/11/2017 Initial Diagnosis   Pancreatic cancer metastasized to liver [facility])     CURRENT THERAPY: Third-line GTX with Xeloda'1000mg'$ BID day 1-14, off for 1 week with Docetaxel'30mg'$ /m2and Gemcitabine'750mg'$ /m2on day 4 and day 11 every 3 weeks starting oral chemo on 04/10/19.Starting 07/06/19 she will reduce Xeloda to '1000mg'$  in the AM and '500mg'$  in the PM due to low blood counts.  INTERVAL HISTORY: Ms. Ayuso returns for f/u and treatment as scheduled. She completed cycle 6 gem/taxotere on 12/9 and xeloda on 12/12, and granix on 12/14. She feels well today. Appetite has been low past few days but improved yesterday. She felt lethargic at times during chemo. She had n/v once but fairly consistent diarrhea. She only required imodium for 1 day. Her mouth felt sensitive without sores. Nails and fingertips are sensitive, no pus or drainage from nailbeds. Neuropathy is stable. She had fever twice on send day after chemo infusion, not above 100. Denies chills, cough, chest pain, dyspnea, leg swelling, new/worse abdominal pain.     MEDICAL HISTORY:  Past Medical History:  Diagnosis Date  . Diarrhea of presumed infectious origin 01/12/2018  . Gastroenteritis  01/12/2018  . Genetic testing 12/02/2017   Multi-Cancer panel (83 genes) @ [facility] - No pathogenic mutations detected  . Meniere disease 2016  . met pancreatic ca to liver dx'd 07/2017  . Pancreatitis     SURGICAL HISTORY: Past Surgical History:  Procedure Laterality Date  . CERVICAL ABLATION    . ENDOMETRIAL ABLATION  2011  . IR CHOLANGIOGRAM EXISTING TUBE  01/09/2019  . IR FLUORO GUIDE PORT INSERTION RIGHT  08/12/2017  . IR PERC CHOLECYSTOSTOMY  12/15/2018  . IR US GUIDE VASC ACCESS RIGHT  08/12/2017  . [place]    I have reviewed the social history and family history with the patient and they are unchanged from previous note.  ALLERGIES:  has No Known Allergies.  MEDICATIONS:  Current Outpatient Medications  Medication Sig Dispense Refill  . b complex vitamins tablet Take 1 tablet by mouth daily.    . capecitabine (XELODA) 500 MG tablet TAKE 2 TABLETS BY MOUTH  TWICE DAILY WITHIN 30  MINUTES OF MEALS ON DAYS 1  TO 14 OF EACH 21 DAY CYCLE 56 tablet 2  . Cholecalciferol (VITAMIN D3 GUMMIES ADULT) 25 MCG (1000 UT) CHEW Chew by mouth. Taking 2 gummies    . cyclobenzaprine (FLEXERIL) 5 MG tablet Take 1 tablet (5 mg total) by mouth 3 (three) times daily as needed for muscle spasms. 30 tablet 0  . eltrombopag (PROMACTA) 50 MG tablet Take 1 tablet (50 mg total) by mouth daily. Take on an empty stomach 1 hour before a meal or 2 hours after 30 tablet 2  . gabapentin (NEURONTIN) 100 MG capsule TAKE ONE CAPSULE BY MOUTH THREE TIMES DAILY (Patient taking differently: Take 100 mg by mouth 2 (two) times daily. )  270 capsule 1  . levofloxacin (LEVAQUIN) 500 MG tablet Take 1 tablet (500 mg total) by mouth daily. 7 tablet 0  . lidocaine-prilocaine (EMLA) cream Apply 1 application topically as needed. 30 g 2  . loperamide (IMODIUM) 2 MG capsule Take 1 capsule (2 mg total) by mouth as needed for diarrhea or loose stools. 30 capsule 0  . loratadine (CLARITIN) 10 MG tablet Take 10 mg by  mouth daily as needed for allergies.    . magic mouthwash w/lidocaine SOLN Take 10 mLs by mouth 3 (three) times daily as needed for mouth pain. Swish and spit 10 ML by mouth 3 times daily as needed for mouth pain. 240 mL 0  . meclizine (ANTIVERT) 25 MG tablet Take 25 mg by mouth 3 (three) times daily as needed for dizziness.    . ondansetron (ZOFRAN-ODT) 8 MG disintegrating tablet TAKE 1 TABLET BY MOUTH EVERY 8 HOURS AS NEEDED FOR NAUSE OR VOMITING 40 tablet 0  . potassium chloride (K-DUR) 10 MEQ tablet Take 1 tablet (10 mEq total) by mouth 4 (four) times daily. (Patient taking differently: Take 30 mEq by mouth daily. ) 360 tablet 2  . prochlorperazine (COMPAZINE) 10 MG tablet Take 1 tablet (10 mg total) by mouth every 6 (six) hours as needed for nausea or vomiting. 40 tablet 2  . Rivaroxaban (XARELTO) 15 MG TABS tablet Take 1 tablet (15 mg total) by mouth daily. 90 tablet 1  . traMADol (ULTRAM) 50 MG tablet Take 1 tablet (50 mg total) by mouth every 6 (six) hours as needed for moderate pain or severe pain. 30 tablet 0   No current facility-administered medications for this visit.   Facility-Administered Medications Ordered in Other Visits  Medication Dose Route Frequency Provider Last Rate Last Admin  . DOCEtaxel (TAXOTERE) 40 mg in sodium chloride 0.9 % 100 mL chemo infusion  30 mg/m2 (Treatment Plan Recorded) Intravenous Once [name], MD      . gemcitabine (GEMZAR) 912 mg in sodium chloride 0.9 % 250 mL chemo infusion  600 mg/m2 (Treatment Plan Recorded) Intravenous Once [name], MD 219 mL/hr at 08/16/19 1317 912 mg at 08/16/19 1317  . heparin lock flush 100 unit/mL  500 Units Intracatheter Once PRN [name], MD      . sodium chloride flush (NS) 0.9 % injection 10 mL  10 mL Intracatheter Once [name], MD      . sodium chloride flush (NS) 0.9 % injection 10 mL  10 mL Intracatheter PRN [name], MD        PHYSICAL EXAMINATION: ECOG PERFORMANCE STATUS: 1 - Symptomatic but completely  ambulatory  Vitals:   08/16/19 1137  BP: 107/74  Pulse: 91  Resp: 16  Temp: 98.5 F (36.9 C)  SpO2: 100%   Filed Weights   08/16/19 1137  Weight: 108 lb (49 kg)    GENERAL:alert, no distress and comfortable SKIN: no rash, no significant palmer erythema  EYES: sclera clear NECK: without mass LUNGS: clear with normal breathing effort HEART: regular rate & rhythm, no lower extremity edema ABDOMEN: abdomen soft, non-tender and normal bowel sounds Musculoskeletal: nail changes to right hand middle finger. Very mild peeling to left thumb NEURO: alert & oriented x 3 with fluent speech, normal gait PAC without erythema   LABORATORY DATA:  I have reviewed the data as listed CBC Latest Ref Rng & Units 08/16/2019 08/02/2019 07/26/2019  WBC 4.0 - 10.5 K/uL 5.3 1.7(L) 4.2  Hemoglobin 12.0 - 15.0 g/dL 9.5(L) 8.4(L)  8.5(L)  Hematocrit 36.0 - 46.0 % 29.3(L) 25.9(L) 26.4(L)  Platelets 150 - 400 K/uL 346 253 312     CMP Latest Ref Rng & Units 08/16/2019 08/02/2019 07/26/2019  Glucose 70 - 99 mg/dL 111(H) 120(H) 123(H)  BUN 6 - 20 mg/dL '17 19 12  '$ Creatinine 0.44 - 1.00 mg/dL 1.01(H) 0.97 1.02(H)  Sodium 135 - 145 mmol/L 140 140 142  Potassium 3.5 - 5.1 mmol/L 3.8 3.7 3.7  Chloride 98 - 111 mmol/L 103 104 106  CO2 22 - 32 mmol/L '27 29 25  '$ Calcium 8.9 - 10.3 mg/dL 9.1 9.2 8.8(L)  Total Protein 6.5 - 8.1 g/dL 7.2 7.0 6.7  Total Bilirubin 0.3 - 1.2 mg/dL 0.9 0.7 0.5  Alkaline Phos 38 - 126 U/L 144(H) 143(H) 137(H)  AST 15 - 41 U/L 43(H) 49(H) 41  ALT 0 - 44 U/L 27 41 29      RADIOGRAPHIC STUDIES: I have personally reviewed the radiological images as listed and agreed with the findings in the report. No results found.   ASSESSMENT & PLAN: Morgan Dawson a 58 y.o.femalewith   1. 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 She was treated withfirst linechemoFOLFIRINOX with dose reduction. Due to neuropathyand  cytopenia, chemo changed tosecond line 5-FU pump infusion and liposomal irinotecan every 2 weekswith dose reduction until she progressed in the liver.  -She was on second-line Gemcitabine and Abraxane2 weeks on/1 week offon 09/12/18 until progression in the liver and new lung nodule. D/c'd 03/27/19  -she started third line GTX on 04/10/19, restaging CT on 07/03/19 showed stable disease. CA 19-9 remained stable on this regimen. She is currently s/p cycle 6 -Ms. Muska appears stable today. Due to side effects she is currently on 1000 mg AM and 500 mg PM. She continues to tolerate GTX moderately well overall, with mild nausea and mild-mod diarrhea that is controlled with supportive meds. She is able to recover well.  -Labs reviewed, CBC and CMP adequate for treatment.  -She began cycle 7 Xeloda on 12/20, she will proceed with cycle 7 day 4 gem/taxotere today. She will return next week for day 11 infusions and will complete Xeloda on 1/2.  -She will then return for granix on 1/4.  -F/u in 3 weeks with cycle 8.   2. Intermittent fever spikes -secondary to tumor, chemo, or infection although clinically she has no signs of infection -Tmax 100 with last cycle, usually occurs day 2 after gem/taxotere  -continue monitoring  3. Low weight  -Due to chemo and underlying metastatic cancer. -stable lately   4. Transaminitis -Due to underlying chemo and malignancy -stable  5. Pancytopenia, secondary to chemo -Anemia has improved, thrombocytopenia resolved on promacta  -neutropenia controlled with granix after day 14 Xeloda   6. Hypokalemia, CKD Stage III - on 30 mEq KCL   7.Peripheral neuropathy, grade 2 -Currently on Neurontin 200 mg at night and B complex -stable overall, mostly in her feet  8.Acute cholecystitis, status post cholecystectomy tube placement 4/23per IR - surgery was not recommended due to high risk of complication, and concerns for cancer progression when holding  chemotherapy for surgery -Drainwas placed during hospitalization, withslightlybloody drainagedaily -Her CT CAP from 12/14/18-numerus gallstones. -Drain fell out on 5/18;imaging in 12/2018 consistent with cholecystitis with cystic duct obstruction. No leak on HIDA. Holding intervention given she is asymptomatic and invasive procedure would delay chemo. -[place] on 6/5 was done due to recurrent fever after resuming chemo;  no indication for gallbladder drainage at this time -given prophylactic 5-day course levaquin on 04/13/19 due to fever spikes during chemo -no clinical evidence of recurrent infection today  9. LE DVT -Diagnosed on December 15, 2018, when she was hospitalized for cholecystitis -Due to her bloody biliary drainage, and moderate thrombocytopenia from chemotherapy, she is on low-dosexarelto'15mg'$  daily (no loading dose) -tolerates well, no bleeding on xarelto   10.Goal of care discussion -treatment goal is palliative  PLAN: -Labs reviewed, CA 19-9 pending  -proceed with cycle 7 day 4 Gem/taxotere, continue Xeloda 1000 mg AM/500 mg PM until 1/2 -return for day 11 infusion on 12/30 -granix on 1/4 -f/u with next cycle in 3 weeks   No problem-specific Assessment & Plan notes found for this encounter.   No orders of the defined types were placed in this encounter.  All questions were answered. The patient knows to call the clinic with any problems, questions or concerns. No barriers to learning was detected.     Alla Feeling, NP 08/16/19

## 2019-08-16 ENCOUNTER — Inpatient Hospital Stay: Payer: 59

## 2019-08-16 ENCOUNTER — Inpatient Hospital Stay (HOSPITAL_BASED_OUTPATIENT_CLINIC_OR_DEPARTMENT_OTHER): Payer: 59 | Admitting: Nurse Practitioner

## 2019-08-16 ENCOUNTER — Other Ambulatory Visit: Payer: Self-pay

## 2019-08-16 ENCOUNTER — Encounter: Payer: Self-pay | Admitting: Nurse Practitioner

## 2019-08-16 VITALS — BP 107/74 | HR 91 | Temp 98.5°F | Resp 16 | Ht 65.0 in | Wt 108.0 lb

## 2019-08-16 DIAGNOSIS — C259 Malignant neoplasm of pancreas, unspecified: Secondary | ICD-10-CM

## 2019-08-16 DIAGNOSIS — C251 Malignant neoplasm of body of pancreas: Secondary | ICD-10-CM

## 2019-08-16 DIAGNOSIS — C787 Secondary malignant neoplasm of liver and intrahepatic bile duct: Secondary | ICD-10-CM

## 2019-08-16 DIAGNOSIS — Z5111 Encounter for antineoplastic chemotherapy: Secondary | ICD-10-CM | POA: Diagnosis not present

## 2019-08-16 DIAGNOSIS — Z95828 Presence of other vascular implants and grafts: Secondary | ICD-10-CM

## 2019-08-16 DIAGNOSIS — Z7189 Other specified counseling: Secondary | ICD-10-CM

## 2019-08-16 LAB — CBC WITH DIFFERENTIAL/PLATELET
Abs Immature Granulocytes: 0.06 10*3/uL (ref 0.00–0.07)
Basophils Absolute: 0.1 10*3/uL (ref 0.0–0.1)
Basophils Relative: 1 %
Eosinophils Absolute: 0.1 10*3/uL (ref 0.0–0.5)
Eosinophils Relative: 1 %
HCT: 29.3 % — ABNORMAL LOW (ref 36.0–46.0)
Hemoglobin: 9.5 g/dL — ABNORMAL LOW (ref 12.0–15.0)
Immature Granulocytes: 1 %
Lymphocytes Relative: 8 %
Lymphs Abs: 0.4 10*3/uL — ABNORMAL LOW (ref 0.7–4.0)
MCH: 35.1 pg — ABNORMAL HIGH (ref 26.0–34.0)
MCHC: 32.4 g/dL (ref 30.0–36.0)
MCV: 108.1 fL — ABNORMAL HIGH (ref 80.0–100.0)
Monocytes Absolute: 0.8 10*3/uL (ref 0.1–1.0)
Monocytes Relative: 14 %
Neutro Abs: 4 10*3/uL (ref 1.7–7.7)
Neutrophils Relative %: 75 %
Platelets: 346 10*3/uL (ref 150–400)
RBC: 2.71 MIL/uL — ABNORMAL LOW (ref 3.87–5.11)
RDW: 20.1 % — ABNORMAL HIGH (ref 11.5–15.5)
WBC: 5.3 10*3/uL (ref 4.0–10.5)
nRBC: 0 % (ref 0.0–0.2)

## 2019-08-16 LAB — COMPREHENSIVE METABOLIC PANEL
ALT: 27 U/L (ref 0–44)
AST: 43 U/L — ABNORMAL HIGH (ref 15–41)
Albumin: 4 g/dL (ref 3.5–5.0)
Alkaline Phosphatase: 144 U/L — ABNORMAL HIGH (ref 38–126)
Anion gap: 10 (ref 5–15)
BUN: 17 mg/dL (ref 6–20)
CO2: 27 mmol/L (ref 22–32)
Calcium: 9.1 mg/dL (ref 8.9–10.3)
Chloride: 103 mmol/L (ref 98–111)
Creatinine, Ser: 1.01 mg/dL — ABNORMAL HIGH (ref 0.44–1.00)
GFR calc Af Amer: >60 mL/min (ref >60)
GFR calc non Af Amer: >60 mL/min (ref >60)
Glucose, Bld: 111 mg/dL — ABNORMAL HIGH (ref 70–99)
Potassium: 3.8 mmol/L (ref 3.5–5.1)
Sodium: 140 mmol/L (ref 135–145)
Total Bilirubin: 0.9 mg/dL (ref 0.3–1.2)
Total Protein: 7.2 g/dL (ref 6.5–8.1)

## 2019-08-16 MED ORDER — SODIUM CHLORIDE 0.9% FLUSH
10.0000 mL | Freq: Once | INTRAVENOUS | Status: AC
  Administered 2019-08-16: 11:00:00 10 mL
  Filled 2019-08-16: qty 10

## 2019-08-16 MED ORDER — DEXAMETHASONE SODIUM PHOSPHATE 10 MG/ML IJ SOLN
10.0000 mg | Freq: Once | INTRAMUSCULAR | Status: AC
  Administered 2019-08-16: 10 mg via INTRAVENOUS

## 2019-08-16 MED ORDER — HEPARIN SOD (PORK) LOCK FLUSH 100 UNIT/ML IV SOLN
500.0000 [IU] | Freq: Once | INTRAVENOUS | Status: AC | PRN
  Administered 2019-08-16: 500 [IU]
  Filled 2019-08-16: qty 5

## 2019-08-16 MED ORDER — SODIUM CHLORIDE 0.9% FLUSH
10.0000 mL | INTRAVENOUS | Status: DC | PRN
  Administered 2019-08-16: 10 mL
  Filled 2019-08-16: qty 10

## 2019-08-16 MED ORDER — DEXAMETHASONE SODIUM PHOSPHATE 10 MG/ML IJ SOLN
INTRAMUSCULAR | Status: AC
  Filled 2019-08-16: qty 1

## 2019-08-16 MED ORDER — SODIUM CHLORIDE 0.9 % IV SOLN
Freq: Once | INTRAVENOUS | Status: AC
  Filled 2019-08-16: qty 250

## 2019-08-16 MED ORDER — SODIUM CHLORIDE 0.9 % IV SOLN
600.0000 mg/m2 | Freq: Once | INTRAVENOUS | Status: AC
  Administered 2019-08-16: 912 mg via INTRAVENOUS
  Filled 2019-08-16: qty 23.99

## 2019-08-16 MED ORDER — SODIUM CHLORIDE 0.9 % IV SOLN
30.0000 mg/m2 | Freq: Once | INTRAVENOUS | Status: AC
  Administered 2019-08-16: 40 mg via INTRAVENOUS
  Filled 2019-08-16: qty 4

## 2019-08-16 NOTE — Patient Instructions (Signed)
[facility] Discharge Instructions for Patients Receiving Chemotherapy  Today you received the following chemotherapy agents: Gemcitabine (Gemzar) and Taxotere (Docetaxel)  To help prevent nausea and vomiting after your treatment, we encourage you to take your nausea medication as directed.    If you develop nausea and vomiting that is not controlled by your nausea medication, call the clinic.   BELOW ARE SYMPTOMS THAT SHOULD BE REPORTED IMMEDIATELY:  *FEVER GREATER THAN 100.5 F  *CHILLS WITH OR WITHOUT FEVER  NAUSEA AND VOMITING THAT IS NOT CONTROLLED WITH YOUR NAUSEA MEDICATION  *UNUSUAL SHORTNESS OF BREATH  *UNUSUAL BRUISING OR BLEEDING  TENDERNESS IN MOUTH AND THROAT WITH OR WITHOUT PRESENCE OF ULCERS  *URINARY PROBLEMS  *BOWEL PROBLEMS  UNUSUAL RASH Items with * indicate a potential emergency and should be followed up as soon as possible.  Feel free to call the clinic should you have any questions or concerns. The clinic phone number is [phone].  Please show the [facility] at check-in to the Emergency Department and triage nurse.  Coronavirus (COVID-19) Are you at risk?  Are you at risk for the Coronavirus (COVID-19)?  To be considered HIGH RISK for Coronavirus (COVID-19), you have to meet the following criteria:  . Traveled to [place], [place], [place], [place] or [place]; or in the [place] to [place], [place], [place], or [place]; and have fever, cough, and shortness of breath within the last 2 weeks of travel OR . Been in close contact with a person diagnosed with COVID-19 within the last 2 weeks and have fever, cough, and shortness of breath . IF YOU DO NOT MEET THESE CRITERIA, YOU ARE CONSIDERED LOW RISK FOR COVID-19.  What to do if you are HIGH RISK for COVID-19?  Marland Kitchen If you are having a medical emergency, call 911. . Seek medical care right away. Before you go to a doctor's office, urgent care or emergency department,  call ahead and tell them about your recent travel, contact with someone diagnosed with COVID-19, and your symptoms. You should receive instructions from your physician's office regarding next steps of care.  . When you arrive at healthcare provider, tell the healthcare staff immediately you have returned from visiting [place], [place], [place], [place] or [place]; or traveled in the [place] to [place], [place], [place], or [place]; in the last two weeks or you have been in close contact with a person diagnosed with COVID-19 in the last 2 weeks.   . Tell the health care staff about your symptoms: fever, cough and shortness of breath. . After you have been seen by a medical provider, you will be either: o Tested for (COVID-19) and discharged home on quarantine except to seek medical care if symptoms worsen, and asked to  - Stay home and avoid contact with others until you get your results (4-5 days)  - Avoid travel on public transportation if possible (such as bus, train, or airplane) or o Sent to the Emergency Department by EMS for evaluation, COVID-19 testing, and possible admission depending on your condition and test results.  What to do if you are LOW RISK for COVID-19?  Reduce your risk of any infection by using the same precautions used for avoiding the common cold or flu:  Marland Kitchen Wash your hands often with soap and warm water for at least 20 seconds.  If soap and water are not readily available, use an alcohol-based hand sanitizer with at least 60% alcohol.  Marland Kitchen  If coughing or sneezing, cover your mouth and nose by coughing or sneezing into the elbow areas of your shirt or coat, into a tissue or into your sleeve (not your hands). . Avoid shaking hands with others and consider head nods or verbal greetings only. . Avoid touching your eyes, nose, or mouth with unwashed hands.  . Avoid close contact with people who are sick. . Avoid places or events with large numbers of people in one  location, like concerts or sporting events. . Carefully consider travel plans you have or are making. . If you are planning any travel outside or inside the [place], visit the CDC's Travelers' Health webpage for the latest health notices. . If you have some symptoms but not all symptoms, continue to monitor at home and seek medical attention if your symptoms worsen. . If you are having a medical emergency, call 911.   ADDITIONAL HEALTHCARE OPTIONS FOR PATIENTS  [facility] / e-Visit: [email or web address]         [facility]: [phone]  [facility]: [phone]                   [facility]: [phone]   

## 2019-08-17 ENCOUNTER — Telehealth: Payer: Self-pay | Admitting: Nurse Practitioner

## 2019-08-17 LAB — CANCER ANTIGEN 19-9: CA 19-9: 75 U/mL — ABNORMAL HIGH (ref 0–35)

## 2019-08-17 NOTE — Telephone Encounter (Signed)
Scheduled appt per 12/23 los.  Patient will get an updated appt calendar at her next scheduled appt.

## 2019-08-23 ENCOUNTER — Inpatient Hospital Stay: Payer: 59

## 2019-08-23 ENCOUNTER — Other Ambulatory Visit: Payer: Self-pay

## 2019-08-23 VITALS — BP 101/69 | HR 83 | Temp 98.2°F | Resp 18

## 2019-08-23 DIAGNOSIS — C251 Malignant neoplasm of body of pancreas: Secondary | ICD-10-CM

## 2019-08-23 DIAGNOSIS — C259 Malignant neoplasm of pancreas, unspecified: Secondary | ICD-10-CM

## 2019-08-23 DIAGNOSIS — C787 Secondary malignant neoplasm of liver and intrahepatic bile duct: Secondary | ICD-10-CM

## 2019-08-23 DIAGNOSIS — Z5111 Encounter for antineoplastic chemotherapy: Secondary | ICD-10-CM | POA: Diagnosis not present

## 2019-08-23 DIAGNOSIS — Z95828 Presence of other vascular implants and grafts: Secondary | ICD-10-CM

## 2019-08-23 DIAGNOSIS — Z7189 Other specified counseling: Secondary | ICD-10-CM

## 2019-08-23 LAB — CBC WITH DIFFERENTIAL/PLATELET
Abs Immature Granulocytes: 0.01 10*3/uL (ref 0.00–0.07)
Basophils Absolute: 0 10*3/uL (ref 0.0–0.1)
Basophils Relative: 3 %
Eosinophils Absolute: 0 10*3/uL (ref 0.0–0.5)
Eosinophils Relative: 1 %
HCT: 27.1 % — ABNORMAL LOW (ref 36.0–46.0)
Hemoglobin: 8.7 g/dL — ABNORMAL LOW (ref 12.0–15.0)
Immature Granulocytes: 1 %
Lymphocytes Relative: 20 %
Lymphs Abs: 0.3 10*3/uL — ABNORMAL LOW (ref 0.7–4.0)
MCH: 34.7 pg — ABNORMAL HIGH (ref 26.0–34.0)
MCHC: 32.1 g/dL (ref 30.0–36.0)
MCV: 108 fL — ABNORMAL HIGH (ref 80.0–100.0)
Monocytes Absolute: 0.2 10*3/uL (ref 0.1–1.0)
Monocytes Relative: 13 %
Neutro Abs: 0.9 10*3/uL — ABNORMAL LOW (ref 1.7–7.7)
Neutrophils Relative %: 62 %
Platelets: 208 10*3/uL (ref 150–400)
RBC: 2.51 MIL/uL — ABNORMAL LOW (ref 3.87–5.11)
RDW: 18.7 % — ABNORMAL HIGH (ref 11.5–15.5)
WBC: 1.4 10*3/uL — ABNORMAL LOW (ref 4.0–10.5)
nRBC: 0 % (ref 0.0–0.2)

## 2019-08-23 LAB — COMPREHENSIVE METABOLIC PANEL
ALT: 27 U/L (ref 0–44)
AST: 40 U/L (ref 15–41)
Albumin: 3.9 g/dL (ref 3.5–5.0)
Alkaline Phosphatase: 150 U/L — ABNORMAL HIGH (ref 38–126)
Anion gap: 12 (ref 5–15)
BUN: 18 mg/dL (ref 6–20)
CO2: 27 mmol/L (ref 22–32)
Calcium: 9.5 mg/dL (ref 8.9–10.3)
Chloride: 104 mmol/L (ref 98–111)
Creatinine, Ser: 0.99 mg/dL (ref 0.44–1.00)
GFR calc Af Amer: >60 mL/min (ref >60)
GFR calc non Af Amer: >60 mL/min (ref >60)
Glucose, Bld: 129 mg/dL — ABNORMAL HIGH (ref 70–99)
Potassium: 3.5 mmol/L (ref 3.5–5.1)
Sodium: 143 mmol/L (ref 135–145)
Total Bilirubin: 0.7 mg/dL (ref 0.3–1.2)
Total Protein: 7.3 g/dL (ref 6.5–8.1)

## 2019-08-23 MED ORDER — SODIUM CHLORIDE 0.9 % IV SOLN
Freq: Once | INTRAVENOUS | Status: AC
  Filled 2019-08-23: qty 250

## 2019-08-23 MED ORDER — DEXAMETHASONE SODIUM PHOSPHATE 10 MG/ML IJ SOLN
10.0000 mg | Freq: Once | INTRAMUSCULAR | Status: AC
  Administered 2019-08-23: 10 mg via INTRAVENOUS

## 2019-08-23 MED ORDER — DEXAMETHASONE SODIUM PHOSPHATE 10 MG/ML IJ SOLN
INTRAMUSCULAR | Status: AC
  Filled 2019-08-23: qty 1

## 2019-08-23 MED ORDER — SODIUM CHLORIDE 0.9% FLUSH
10.0000 mL | INTRAVENOUS | Status: DC | PRN
  Administered 2019-08-23: 10 mL
  Filled 2019-08-23: qty 10

## 2019-08-23 MED ORDER — SODIUM CHLORIDE 0.9 % IV SOLN
600.0000 mg/m2 | Freq: Once | INTRAVENOUS | Status: AC
  Administered 2019-08-23: 912 mg via INTRAVENOUS
  Filled 2019-08-23: qty 23.99

## 2019-08-23 MED ORDER — SODIUM CHLORIDE 0.9% FLUSH
10.0000 mL | Freq: Once | INTRAVENOUS | Status: AC
  Administered 2019-08-23: 10 mL
  Filled 2019-08-23: qty 10

## 2019-08-23 MED ORDER — SODIUM CHLORIDE 0.9 % IV SOLN
30.0000 mg/m2 | Freq: Once | INTRAVENOUS | Status: AC
  Administered 2019-08-23: 40 mg via INTRAVENOUS
  Filled 2019-08-23: qty 4

## 2019-08-23 MED ORDER — HEPARIN SOD (PORK) LOCK FLUSH 100 UNIT/ML IV SOLN
500.0000 [IU] | Freq: Once | INTRAVENOUS | Status: AC | PRN
  Administered 2019-08-23: 500 [IU]
  Filled 2019-08-23: qty 5

## 2019-08-23 NOTE — Patient Instructions (Signed)
[facility] Discharge Instructions for Patients Receiving Chemotherapy  Today you received the following chemotherapy agents Gemcitabine (GEMZAR) & Docetaxel (TAXOTERE).  To help prevent nausea and vomiting after your treatment, we encourage you to take your nausea medication as prescribed.   If you develop nausea and vomiting that is not controlled by your nausea medication, call the clinic.   BELOW ARE SYMPTOMS THAT SHOULD BE REPORTED IMMEDIATELY:  *FEVER GREATER THAN 100.5 F  *CHILLS WITH OR WITHOUT FEVER  NAUSEA AND VOMITING THAT IS NOT CONTROLLED WITH YOUR NAUSEA MEDICATION  *UNUSUAL SHORTNESS OF BREATH  *UNUSUAL BRUISING OR BLEEDING  TENDERNESS IN MOUTH AND THROAT WITH OR WITHOUT PRESENCE OF ULCERS  *URINARY PROBLEMS  *BOWEL PROBLEMS  UNUSUAL RASH Items with * indicate a potential emergency and should be followed up as soon as possible.  Feel free to call the clinic should you have any questions or concerns. The clinic phone number is [phone].  Please show the [facility] at check-in to the Emergency Department and triage nurse.  Coronavirus (COVID-19) Are you at risk?  Are you at risk for the Coronavirus (COVID-19)?  To be considered HIGH RISK for Coronavirus (COVID-19), you have to meet the following criteria:  . Traveled to [place], [place], [place], [place] or [place]; or in the [place] to [place], [place], [place], or [place]; and have fever, cough, and shortness of breath within the last 2 weeks of travel OR . Been in close contact with a person diagnosed with COVID-19 within the last 2 weeks and have fever, cough, and shortness of breath . IF YOU DO NOT MEET THESE CRITERIA, YOU ARE CONSIDERED LOW RISK FOR COVID-19.  What to do if you are HIGH RISK for COVID-19?  Marland Kitchen If you are having a medical emergency, call 911. . Seek medical care right away. Before you go to a doctor's office, urgent care or emergency department, call  ahead and tell them about your recent travel, contact with someone diagnosed with COVID-19, and your symptoms. You should receive instructions from your physician's office regarding next steps of care.  . When you arrive at healthcare provider, tell the healthcare staff immediately you have returned from visiting [place], [place], [place], [place] or [place]; or traveled in the [place] to [place], [place], [place], or [place]; in the last two weeks or you have been in close contact with a person diagnosed with COVID-19 in the last 2 weeks.   . Tell the health care staff about your symptoms: fever, cough and shortness of breath. . After you have been seen by a medical provider, you will be either: o Tested for (COVID-19) and discharged home on quarantine except to seek medical care if symptoms worsen, and asked to  - Stay home and avoid contact with others until you get your results (4-5 days)  - Avoid travel on public transportation if possible (such as bus, train, or airplane) or o Sent to the Emergency Department by EMS for evaluation, COVID-19 testing, and possible admission depending on your condition and test results.  What to do if you are LOW RISK for COVID-19?  Reduce your risk of any infection by using the same precautions used for avoiding the common cold or flu:  Marland Kitchen Wash your hands often with soap and warm water for at least 20 seconds.  If soap and water are not readily available, use an alcohol-based hand sanitizer with at least 60% alcohol.  Marland Kitchen  If coughing or sneezing, cover your mouth and nose by coughing or sneezing into the elbow areas of your shirt or coat, into a tissue or into your sleeve (not your hands). . Avoid shaking hands with others and consider head nods or verbal greetings only. . Avoid touching your eyes, nose, or mouth with unwashed hands.  . Avoid close contact with people who are sick. . Avoid places or events with large numbers of people in one location,  like concerts or sporting events. . Carefully consider travel plans you have or are making. . If you are planning any travel outside or inside the [place], visit the CDC's Travelers' Health webpage for the latest health notices. . If you have some symptoms but not all symptoms, continue to monitor at home and seek medical attention if your symptoms worsen. . If you are having a medical emergency, call 911.   ADDITIONAL HEALTHCARE OPTIONS FOR PATIENTS  [facility] / e-Visit: [email or web address]         [facility]: [phone]  [facility]: [phone]                   [facility]: [phone]    

## 2019-08-23 NOTE — Progress Notes (Signed)
Per Dr Burr Medico: okay to treat patient with [place] of 0.9, patient will get Granix injection on Monday.

## 2019-08-28 ENCOUNTER — Other Ambulatory Visit: Payer: Self-pay

## 2019-08-28 ENCOUNTER — Inpatient Hospital Stay: Payer: BLUE CROSS/BLUE SHIELD | Attending: Hematology

## 2019-08-28 VITALS — BP 104/67 | HR 85 | Temp 98.0°F | Resp 18

## 2019-08-28 DIAGNOSIS — Z5189 Encounter for other specified aftercare: Secondary | ICD-10-CM | POA: Diagnosis not present

## 2019-08-28 DIAGNOSIS — Z5111 Encounter for antineoplastic chemotherapy: Secondary | ICD-10-CM | POA: Insufficient documentation

## 2019-08-28 DIAGNOSIS — C787 Secondary malignant neoplasm of liver and intrahepatic bile duct: Secondary | ICD-10-CM | POA: Diagnosis present

## 2019-08-28 DIAGNOSIS — Z95828 Presence of other vascular implants and grafts: Secondary | ICD-10-CM

## 2019-08-28 DIAGNOSIS — C259 Malignant neoplasm of pancreas, unspecified: Secondary | ICD-10-CM | POA: Diagnosis present

## 2019-08-28 MED ORDER — FILGRASTIM-SNDZ 300 MCG/0.5ML IJ SOSY
PREFILLED_SYRINGE | INTRAMUSCULAR | Status: AC
  Filled 2019-08-28: qty 0.5

## 2019-08-28 MED ORDER — FILGRASTIM-SNDZ 300 MCG/0.5ML IJ SOSY
300.0000 ug | PREFILLED_SYRINGE | Freq: Once | INTRAMUSCULAR | Status: AC
  Administered 2019-08-28: 300 ug via SUBCUTANEOUS

## 2019-08-28 NOTE — Patient Instructions (Signed)

## 2019-08-30 ENCOUNTER — Telehealth: Payer: Self-pay

## 2019-08-30 NOTE — Telephone Encounter (Signed)
Morgan Dawson called stating that her insurance has changed.  She will need to get Xeloda through [facility] Phone [phone]

## 2019-09-05 ENCOUNTER — Other Ambulatory Visit: Payer: Self-pay | Admitting: Hematology

## 2019-09-05 NOTE — Progress Notes (Signed)
[facility]   [phone] Fax:([phone]   Clinic Follow up Note   Patient Care Team: [name], MD as PCP - General (Family Medicine) [name], MD as Consulting Physician (Otolaryngology) [name], MD as Consulting Physician (Oncology) Date of service: 09/06/2019   CHIEF COMPLAINT: F/u pancreas cancer   SUMMARY OF ONCOLOGIC HISTORY: Oncology History Overview Note  Cancer Staging Pancreatic cancer [facility]) Staging form: Exocrine Pancreas, AJCC 8th Edition - Clinical stage from 08/06/2017: Stage IV (cTX, cN0, pM1) - Signed by [name], MD on 08/12/2017     Pancreatic cancer ([place])  07/23/2017 Imaging   US abdomen limited RUQ 07/23/17 IMPRESSION: 1. Cholelithiasis.  No secondary signs of acute cholecystitis. 2. Multiple solid liver masses measuring up to 6.5 cm in the left lobe of the liver, evaluation for metastatic disease is recommended. These results will be called to the ordering clinician or representative by the Radiologist Assistant, and communication documented in the PACS or zVision Dashboard.   07/23/2017 Imaging   CT Abdomen W Contrast 07/23/17 IMPRESSION: 1. Widespread metastatic disease throughout the liver. No clear primary malignancy identified in the abdomen. The pelvis was not imaged. Tissue sampling recommended. 2. Probable adenopathy superior to the pancreatic tail. No evidence of pancreatic mass. 3. Suspected incidental hemangioma inferiorly in the right hepatic lobe. 4. Nonspecific nodularity in the breasts. The patient has undergone recent (03/25/2017 and 04/01/2017) mammography and ultrasound.   07/29/2017 Initial Diagnosis   Metastasis to liver of unknown origin ([place])   07/29/2017 PET scan   PET 07/29/17  IMPRESSION: 1. Numerous bulky liver masses are hypermetabolic compatible with malignancy. 2. Accentuated activity within or along the pancreatic tail, likely represent a primary pancreatic tumor.  Consider pancreatic protocol MRI to further work this up. 3. The peripancreatic lymph node shown above the pancreatic tail is mildly hypermetabolic favoring malignancy. 4.  Prominent stool throughout the colon favors constipation. 5. 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   08/05/2017 Pathology Results   Liver Biopsy  Diagnosis 08/05/17 Liver, needle/core biopsy - CARCINOMA. - SEE COMMENT. Microscopic Comment The malignant cells are positive for cytokeratin 7. They are negative for arginase, CDX2, cytokeratin 20, estrogen receptor, GATA-3, GCDFP, Glypican 3, Hep Par 1, Napsin A, and TTF-1. This immunohistochemical is nonspecific. Possibly primary sources include pancreatobiliary and upper gastrointestinal. Radiologic correlation is necessary. Of note, organ specific markers (GATA-3, GCDFP-breast, TTF-1, Napsin A-lung, and CDX2-colon) are negative. (JBK:ecj 08/09/2017)   08/21/2017 - 02/10/2018 Chemotherapy   FOLFIRINOX every 2 weeks starting 08/21/17. Dose reduced due to neuropahty and cytopenia    10/14/2017 Imaging   CT CAP WO Contrast 10/14/17 IMPRESSION: Evidence of known numerous liver metastases without significant interval change. No other evidence of metastatic disease within the chest, abdomen or pelvis. Cholelithiasis. Tiny pericardial effusion.   12/02/2017 Genetic Testing   Negative for pathogenic mutation.  The genes analyzed were the 83 genes on [facility]'s Multi-Cancer panel (ALK, APC, ATM, AXIN2, BAP1, BARD1, BLM, BMPR1A, BRCA1, BRCA2, BRIP1, CASR, CDC73, CDH1, CDK4, CDKN1B, CDKN1C, CDKN2A, CEBPA, CHEK2, CTNNA1, DICER1, DIS3L2, EGFR, EPCAM, FH, FLCN, GATA2, GPC3, GREM1, HOXB13, HRAS, KIT, MAX, MEN1, MET, MITF, MLH1, MSH2, MSH3, MSH6, MUTYH, NBN, NF1, NF2, NTHL1, PALB2, PDGFRA, PHOX2B, PMS2, POLD1, POLE, POT1, PRKAR1A, PTCH1, PTEN, RAD50, RAD51C, RAD51D, RB1, RECQL4, RET, RUNX1, SDHA, SDHAF2, SDHB, SDHC, SDHD, SMAD4, SMARCA4, SMARCB1, SMARCE1, STK11, SUFU, TERC, TERT,  TMEM127, TP53, TSC1, TSC2, VHL, WRN, WT1).   12/17/2017 PET scan   IMPRESSION: 1. Although the liver metastases are  still visible on the CT images, they are significantly smaller and retain no abnormal metabolic activity, consistent with response to therapy. 2. No abnormal activity within the pancreas. 3. No disease progression identified. 4. Cholelithiasis.   02/21/2018 PET scan   02/21/2018 PET Scan  IMPRESSION: 1. There are 2 new small nodules identified within the right lung which measure up to 5 mm. These are too small to reliably characterize by PET-CT, but warrant close interval follow-up. 2. Again noted are multifocal liver metastasis. The target lesion within the left lobe is slightly decreased in size when compared with the previous exam. Similar to previous exam there is no abnormal hypermetabolism above background liver activity identified within the liver lesions. 3. Gallstones.   02/23/2018 - 09/04/2018 Chemotherapy   5-FU pump infusion and liposomal irinotecan, every 2 weeks.  First cycle dose reduced due to cytopenia in the travel. Stopped on 08/25/2018 due to disease progression.    05/17/2018 Imaging   05/17/2018 PET Scan IMPRESSION: 1. Mixed response. The 2 small right lung nodules have decreased in size in the interval. Single focus of increased uptake within the liver is new from 02/21/2018 and concerning for recurrent metabolically active liver metastasis. 2. Splenomegaly.  New from previous exam. 3. Diffusely increased bone marrow activity, likely reflecting treatment related changes.   09/01/2018 PET scan   PET 09/01/18 IMPRESSION: 1. Unfortunately there recurrence of hepatic metastasis with multiple new hypermetabolic lesions in LEFT and RIGHT hepatic lobe. 2. New extra hepatic site of malignancy which appears associated the mid pancreas. Difficult to define lesion on noncontrast exam. 3. Diffuse marrow activity is favored benign. 4. No evidence of pulmonary  metastasis.   09/12/2018 - 03/27/2019 Chemotherapy   second-line Gemcitabine and Abraxane for 2 weeks on, 1 week off starting 09/12/2018. Due to neuropathy, I will start her with dose reduced Abraxane. Stopped after 03/27/19 due to disease progression.    12/14/2018 Imaging   CT CAP  IMPRESSION: CT demonstrates acute cholecystitis, with choledocholithiasis of the cystic duct.  These results were discussed by telephone at the time of interpretation on 12/14/2018 at 5:05 pm with Dr. Burr Medico.  Redemonstration of known liver metastases, with positive response to therapy, interval reduction in size of all lesions, with multiple demonstrating significant internal necrosis.  No acute finding of the chest.  Ancillary findings as above.   03/21/2019 Imaging   CT CAP W Contrast IMPRESSION: 1. Numerous rim enhancing liver metastases show overall trend towards mild progression although 1 of the index lesions is stable in the interval. 2. Interval development of portocaval lymphadenopathy concerning for disease progression. 3. Slight progression of main duct dilatation in the pancreas with abrupt cut off in the region of the body. 4. Interval development of a 9 mm subpleural nodule at the right base along the posterior hemidiaphragm. Close attention on follow-up recommended as metastatic disease not excluded. 5. New splenomegaly. 6. Cholelithiasis with diffuse gallbladder wall thickening. Gallbladder wall thickening is decreased in the interval. Stones and sludge noted in the gallbladder lumen.   04/09/2019 Imaging   CT AP W Contrast 04/09/19  IMPRESSION: 1. Multiple gallstones in lumen gallbladder without evidence acute cholecystitis. No significant change from prior. No biliary duct dilatation. Common bile duct normal. 2. Widespread hepatic metastasis unchanged from comparison CT. 3. Chronic pancreatic duct dilatation in the body and tail unchanged. 4. No bowel obstruction. 5. Small  amount of intraperitoneal free fluid unchanged.   04/10/2019 -  Chemotherapy   Third-line GTX with Xeloda 1040m  BID day 1-14, off for 1 week with Docetaxel and Gemcitbine on day 4 and day 11 every 3 weeks starting oral chemo on 04/10/19.    07/03/2019 Imaging   CT CAP W Contrast  IMPRESSION: 1. Overall stable diffuse hepatic metastatic disease. No new/progressive findings. 2. Stable to slightly larger periportal lymph node. 3. Stable CT appearance of the pancreas. Abrupt cut off of a dilated main pancreatic duct in the body/head junction region without obvious pancreatic lesion. 4. No pulmonary nodules are identified at the lung bases. 5. Stable mild splenomegaly. 6. No new/acute findings.     Pancreatic cancer metastasized to liver ([place])  08/11/2017 Initial Diagnosis   Pancreatic cancer metastasized to liver [facility])     CURRENT THERAPY:  Third-line GTX with Xeloda1070mBID day 1-14, off for 1 week with Docetaxel370mm2and Gemcitabine75045m2on day 4 and day 11 every 3 weeks starting oral chemo on 04/10/19.Starting 07/06/19 she will reduce Xeloda to 1000m42m the AM and 500mg26mthe PM due to low blood counts.  INTERVAL HISTORY: Ms. HanneDetterrns for f/u and treatment as scheduled. For more than 1 month she has increased lacrimation from her right eye, none in left. Worse in morning then gets better throughout the day. Improves on her week off chemo. No eye redness, pain, or vision changes. Right hand middle finger is coming lose, no redness or drainage, not painful. She is careful when she uses it. Hands and feet are dry without redness, cracks, or pain. Neuropathy, more in feet than hands, is stable to mildly increased from cold weather. No fall or balance issue. She continues to have sore mouth for 5 days on chemo, no sores. Doesn't limit po intake. No n/v. She has constipation for 2 days after treatment then diarrhea for 2 days which resolves after taking imodium. No new or  worsening abdominal pain. No recent fever, chills, cough, chest pain, dyspnea, or leg swelling.    MEDICAL HISTORY:  Past Medical History:  Diagnosis Date  . Diarrhea of presumed infectious origin 01/12/2018  . Gastroenteritis 01/12/2018  . Genetic testing 12/02/2017   Multi-Cancer panel (83 genes) @ [facility] - No pathogenic mutations detected  . Meniere disease 2016  . met pancreatic ca to liver dx'd 07/2017  . Pancreatitis     SURGICAL HISTORY: Past Surgical History:  Procedure Laterality Date  . CERVICAL ABLATION    . ENDOMETRIAL ABLATION  2011  . IR CHOLANGIOGRAM EXISTING TUBE  01/09/2019  . IR FLUORO GUIDE PORT INSERTION RIGHT  08/12/2017  . IR PERC CHOLECYSTOSTOMY  12/15/2018  . IR US [place] VASC ACCESS RIGHT  08/12/2017  . MAND[place]ake Andes have reviewed the social history and family history with the patient and they are unchanged from previous note.  ALLERGIES:  has No Known Allergies.  MEDICATIONS:  Current Outpatient Medications  Medication Sig Dispense Refill  . b complex vitamins tablet Take 1 tablet by mouth daily.    . capecitabine (XELODA) 500 MG tablet TAKE 2 TABLETS BY MOUTH  TWICE DAILY WITHIN 30  MINUTES OF MEALS ON DAYS 1  TO 14 OF EACH 21 DAY CYCLE 56 tablet 2  . Cholecalciferol (VITAMIN D3 GUMMIES ADULT) 25 MCG (1000 UT) CHEW Chew by mouth. Taking 2 gummies    . eltrombopag (PROMACTA) 50 MG tablet Take 1 tablet (50 mg total) by mouth daily. Take on an empty stomach 1 hour before a meal or 2 hours after 30 tablet 2  . gabapentin (NEURONTIN) 100  MG capsule TAKE ONE CAPSULE BY MOUTH THREE TIMES DAILY (Patient taking differently: Take 100 mg by mouth 2 (two) times daily. ) 270 capsule 1  . lidocaine-prilocaine (EMLA) cream Apply 1 application topically as needed. 30 g 2  . loperamide (IMODIUM) 2 MG capsule Take 1 capsule (2 mg total) by mouth as needed for diarrhea or loose stools. 30 capsule 0  . magic mouthwash w/lidocaine SOLN Take 10 mLs by mouth 3  (three) times daily as needed for mouth pain. Swish and spit 10 ML by mouth 3 times daily as needed for mouth pain. 240 mL 0  . meclizine (ANTIVERT) 25 MG tablet Take 25 mg by mouth 3 (three) times daily as needed for dizziness.    . ondansetron (ZOFRAN-ODT) 8 MG disintegrating tablet TAKE 1 TABLET BY MOUTH EVERY 8 HOURS AS NEEDED FOR NAUSE OR VOMITING 40 tablet 0  . potassium chloride (K-DUR) 10 MEQ tablet Take 1 tablet (10 mEq total) by mouth 4 (four) times daily. (Patient taking differently: Take 30 mEq by mouth daily. ) 360 tablet 2  . prochlorperazine (COMPAZINE) 10 MG tablet Take 1 tablet (10 mg total) by mouth every 6 (six) hours as needed for nausea or vomiting. 40 tablet 2  . Rivaroxaban (XARELTO) 15 MG TABS tablet Take 1 tablet (15 mg total) by mouth daily. 90 tablet 1  . cyclobenzaprine (FLEXERIL) 5 MG tablet Take 1 tablet (5 mg total) by mouth 3 (three) times daily as needed for muscle spasms. 30 tablet 0  . loratadine (CLARITIN) 10 MG tablet Take 10 mg by mouth daily as needed for allergies.    Marland Kitchen traMADol (ULTRAM) 50 MG tablet Take 1 tablet (50 mg total) by mouth every 6 (six) hours as needed for moderate pain or severe pain. 30 tablet 0   No current facility-administered medications for this visit.   Facility-Administered Medications Ordered in Other Visits  Medication Dose Route Frequency Provider Last Rate Last Admin  . sodium chloride flush (NS) 0.9 % injection 10 mL  10 mL Intracatheter Once [name], MD        PHYSICAL EXAMINATION: ECOG PERFORMANCE STATUS: 1 - Symptomatic but completely ambulatory  Vitals:   09/06/19 1101  BP: 106/70  Pulse: 80  Resp: 16  Temp: 98.5 F (36.9 C)  SpO2: 100%   Filed Weights   09/06/19 1101  Weight: 107 lb 12.8 oz (48.9 kg)    GENERAL:alert, no distress and comfortable SKIN: no rash. Hyperpigmentation to right middle finger nailbed with loosening of nail from finger around the edges  EYES:  Increased lacrimation from right eye,  sclera clear, non-injected. No periorbital edema or tenderness  NECK: without mass LUNGS: clear with normal breathing effort HEART: regular rate & rhythm, no lower extremity edema ABDOMEN: abdomen soft, non-tender and normal bowel sounds NEURO: alert & oriented x 3 with fluent speech, no focal motor deficits PAC without erythema   LABORATORY DATA:  I have reviewed the data as listed CBC Latest Ref Rng & Units 09/06/2019 08/23/2019 08/16/2019  WBC 4.0 - 10.5 K/uL 3.8(L) 1.4(L) 5.3  Hemoglobin 12.0 - 15.0 g/dL 8.8(L) 8.7(L) 9.5(L)  Hematocrit 36.0 - 46.0 % 27.1(L) 27.1(L) 29.3(L)  Platelets 150 - 400 K/uL 284 208 346     CMP Latest Ref Rng & Units 09/06/2019 08/23/2019 08/16/2019  Glucose 70 - 99 mg/dL 114(H) 129(H) 111(H)  BUN 6 - 20 mg/dL 13 18 17   Creatinine 0.44 - 1.00 mg/dL 0.97 0.99 1.01(H)  Sodium 135 - 145  mmol/L 140 143 140  Potassium 3.5 - 5.1 mmol/L 3.8 3.5 3.8  Chloride 98 - 111 mmol/L 105 104 103  CO2 22 - 32 mmol/L 24 27 27   Calcium 8.9 - 10.3 mg/dL 8.8(L) 9.5 9.1  Total Protein 6.5 - 8.1 g/dL 7.0 7.3 7.2  Total Bilirubin 0.3 - 1.2 mg/dL 0.6 0.7 0.9  Alkaline Phos 38 - 126 U/L 154(H) 150(H) 144(H)  AST 15 - 41 U/L 40 40 43(H)  ALT 0 - 44 U/L 24 27 27       RADIOGRAPHIC STUDIES: I have personally reviewed the radiological images as listed and agreed with the findings in the report. No results found.   ASSESSMENT & PLAN: Morgan Dawson a 59 y.o.femalewith   1. 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 She was treated withfirst linechemoFOLFIRINOX with dose reduction. Due to neuropathyand cytopenia, chemo changed tosecond line 5-FU pump infusion and liposomal irinotecan every 2 weekswith dose reduction until she progressed in the liver.  -She was on second-line Gemcitabine and Abraxane2 weeks on/1 week offon 09/12/18 until progression in the liver and new lung nodule. D/c'd 03/27/19  -she  started third line GTX on 04/10/19, restaging CT on 07/03/19 showed stable disease. She is currently s/p cycle 7 -Morgan Dawson appears stable today. She continues Xeloda 1000 mg AM and 500 mg PM due to neutropenia.   She tolerates treatment well overall. Denies abdominal or new pain. No recent fever spikes. She developed lacrimation to right eye, likely due to taxotere, no eye pain or vision changes. She also has nail toxicity due to xeloda and taxotere. She is careful with her hands, no signs of infection.  -Labs adequate to proceed with cycle 8 day 4 GTX today. CA 19-9 remains stable.  -She will return for day 11 infusion next week, and granix on 1/25 -F/u in 3 weeks with next cycle  2. Intermittent fever spikes -secondary to tumor, chemo, or infection although clinically she has no signs of infection -none recently, will continue monitoring  3. Low weight  -Due to chemo and underlying metastatic cancer. -stable lately   4. Transaminitis -Due to underlying chemo and malignancy -ALK PHOS elevation remains stable   5. Pancytopenia, secondary to chemo -Anemia has improved, thrombocytopenia resolvedon promacta  -neutropenia controlled with granix after day 14 Xeloda   6. Hypokalemia, CKD Stage III - on 30 mEq KCL   7.Peripheral neuropathy, grade 2 -Currently on Neurontin 200 mg at night and B complex -stable overall, mostly in her feet  8.Acute cholecystitis, status post cholecystectomy tube placement 4/23per IR - surgery was not recommended due to high risk of complication, and concerns for cancer progression when holding chemotherapy for surgery -Drainwas placed during hospitalization, withslightlybloody drainagedaily -Her CT CAP from 12/14/18-numerus gallstones. -Drain fell out on 5/18;imaging in 12/2018 consistent with cholecystitis with cystic duct obstruction. No leak on HIDA. Holding intervention given she is asymptomatic and invasive procedure would delay  chemo. -[place] on 6/5 was done due to recurrent fever after resuming chemo; no indication for gallbladder drainage at this time -given prophylactic 5-day course levaquin on 04/13/19 due to fever spikes during chemo -no evidence of recurrent infection today   9. LE DVT -Diagnosed on December 15, 2018, when she was hospitalized for cholecystitis -Due to her bloody biliary drainage, and moderate thrombocytopenia from chemotherapy, she is on low-dosexarelto46m daily (no loading dose) -tolerates well, no bleeding on xarelto   10.Goal of care discussion -treatment  goal is palliative   PLAN: -Labs reviewed -Proceed with cycle 8 day 4 GTX today -Return in 1 week for day 11 -granix on 1/25 -F/u in 3 weeks with next cycle   No problem-specific Assessment & Plan notes found for this encounter.   No orders of the defined types were placed in this encounter.  All questions were answered. The patient knows to call the clinic with any problems, questions or concerns. No barriers to learning was detected.     Alla Feeling, NP 09/07/19

## 2019-09-06 ENCOUNTER — Other Ambulatory Visit: Payer: Self-pay

## 2019-09-06 ENCOUNTER — Inpatient Hospital Stay (HOSPITAL_BASED_OUTPATIENT_CLINIC_OR_DEPARTMENT_OTHER): Payer: BLUE CROSS/BLUE SHIELD | Admitting: Nurse Practitioner

## 2019-09-06 ENCOUNTER — Inpatient Hospital Stay: Payer: BLUE CROSS/BLUE SHIELD

## 2019-09-06 VITALS — BP 106/70 | HR 80 | Temp 98.5°F | Resp 16 | Ht 65.0 in | Wt 107.8 lb

## 2019-09-06 DIAGNOSIS — Z7189 Other specified counseling: Secondary | ICD-10-CM

## 2019-09-06 DIAGNOSIS — C787 Secondary malignant neoplasm of liver and intrahepatic bile duct: Secondary | ICD-10-CM

## 2019-09-06 DIAGNOSIS — C259 Malignant neoplasm of pancreas, unspecified: Secondary | ICD-10-CM

## 2019-09-06 DIAGNOSIS — C251 Malignant neoplasm of body of pancreas: Secondary | ICD-10-CM

## 2019-09-06 LAB — CBC WITH DIFFERENTIAL/PLATELET
Abs Immature Granulocytes: 0.04 10*3/uL (ref 0.00–0.07)
Basophils Absolute: 0 10*3/uL (ref 0.0–0.1)
Basophils Relative: 1 %
Eosinophils Absolute: 0 10*3/uL (ref 0.0–0.5)
Eosinophils Relative: 1 %
HCT: 27.1 % — ABNORMAL LOW (ref 36.0–46.0)
Hemoglobin: 8.8 g/dL — ABNORMAL LOW (ref 12.0–15.0)
Immature Granulocytes: 1 %
Lymphocytes Relative: 9 %
Lymphs Abs: 0.3 10*3/uL — ABNORMAL LOW (ref 0.7–4.0)
MCH: 35.1 pg — ABNORMAL HIGH (ref 26.0–34.0)
MCHC: 32.5 g/dL (ref 30.0–36.0)
MCV: 108 fL — ABNORMAL HIGH (ref 80.0–100.0)
Monocytes Absolute: 0.5 10*3/uL (ref 0.1–1.0)
Monocytes Relative: 13 %
Neutro Abs: 2.9 10*3/uL (ref 1.7–7.7)
Neutrophils Relative %: 75 %
Platelets: 284 10*3/uL (ref 150–400)
RBC: 2.51 MIL/uL — ABNORMAL LOW (ref 3.87–5.11)
RDW: 18.3 % — ABNORMAL HIGH (ref 11.5–15.5)
WBC: 3.8 10*3/uL — ABNORMAL LOW (ref 4.0–10.5)
nRBC: 0 % (ref 0.0–0.2)

## 2019-09-06 LAB — COMPREHENSIVE METABOLIC PANEL WITH GFR
ALT: 24 U/L (ref 0–44)
AST: 40 U/L (ref 15–41)
Albumin: 3.9 g/dL (ref 3.5–5.0)
Alkaline Phosphatase: 154 U/L — ABNORMAL HIGH (ref 38–126)
Anion gap: 11 (ref 5–15)
BUN: 13 mg/dL (ref 6–20)
CO2: 24 mmol/L (ref 22–32)
Calcium: 8.8 mg/dL — ABNORMAL LOW (ref 8.9–10.3)
Chloride: 105 mmol/L (ref 98–111)
Creatinine, Ser: 0.97 mg/dL (ref 0.44–1.00)
GFR calc Af Amer: >60 mL/min
GFR calc non Af Amer: >60 mL/min
Glucose, Bld: 114 mg/dL — ABNORMAL HIGH (ref 70–99)
Potassium: 3.8 mmol/L (ref 3.5–5.1)
Sodium: 140 mmol/L (ref 135–145)
Total Bilirubin: 0.6 mg/dL (ref 0.3–1.2)
Total Protein: 7 g/dL (ref 6.5–8.1)

## 2019-09-06 MED ORDER — SODIUM CHLORIDE 0.9% FLUSH
10.0000 mL | INTRAVENOUS | Status: DC | PRN
  Administered 2019-09-06: 10 mL
  Filled 2019-09-06: qty 10

## 2019-09-06 MED ORDER — DEXAMETHASONE SODIUM PHOSPHATE 10 MG/ML IJ SOLN
10.0000 mg | Freq: Once | INTRAMUSCULAR | Status: AC
  Administered 2019-09-06: 10 mg via INTRAVENOUS

## 2019-09-06 MED ORDER — SODIUM CHLORIDE 0.9 % IV SOLN
Freq: Once | INTRAVENOUS | Status: AC
  Filled 2019-09-06: qty 250

## 2019-09-06 MED ORDER — DEXAMETHASONE SODIUM PHOSPHATE 10 MG/ML IJ SOLN
INTRAMUSCULAR | Status: AC
  Filled 2019-09-06: qty 1

## 2019-09-06 MED ORDER — SODIUM CHLORIDE 0.9 % IV SOLN
600.0000 mg/m2 | Freq: Once | INTRAVENOUS | Status: AC
  Administered 2019-09-06: 912 mg via INTRAVENOUS
  Filled 2019-09-06: qty 23.99

## 2019-09-06 MED ORDER — SODIUM CHLORIDE 0.9 % IV SOLN
30.0000 mg/m2 | Freq: Once | INTRAVENOUS | Status: AC
  Administered 2019-09-06: 40 mg via INTRAVENOUS
  Filled 2019-09-06: qty 4

## 2019-09-06 MED ORDER — HEPARIN SOD (PORK) LOCK FLUSH 100 UNIT/ML IV SOLN
500.0000 [IU] | Freq: Once | INTRAVENOUS | Status: AC | PRN
  Administered 2019-09-06: 500 [IU]
  Filled 2019-09-06: qty 5

## 2019-09-06 NOTE — Patient Instructions (Signed)
[facility] Discharge Instructions for Patients Receiving Chemotherapy  Today you received the following chemotherapy agents: gemcitabine and docetaxel.  To help prevent nausea and vomiting after your treatment, we encourage you to take your nausea medication as directed.   If you develop nausea and vomiting that is not controlled by your nausea medication, call the clinic.   BELOW ARE SYMPTOMS THAT SHOULD BE REPORTED IMMEDIATELY:  *FEVER GREATER THAN 100.5 F  *CHILLS WITH OR WITHOUT FEVER  NAUSEA AND VOMITING THAT IS NOT CONTROLLED WITH YOUR NAUSEA MEDICATION  *UNUSUAL SHORTNESS OF BREATH  *UNUSUAL BRUISING OR BLEEDING  TENDERNESS IN MOUTH AND THROAT WITH OR WITHOUT PRESENCE OF ULCERS  *URINARY PROBLEMS  *BOWEL PROBLEMS  UNUSUAL RASH Items with * indicate a potential emergency and should be followed up as soon as possible.  Feel free to call the clinic should you have any questions or concerns. The clinic phone number is [phone].  Please show the [place] at check-in to the Emergency Department and triage nurse.

## 2019-09-07 ENCOUNTER — Encounter: Payer: Self-pay | Admitting: Nurse Practitioner

## 2019-09-07 ENCOUNTER — Telehealth: Payer: Self-pay | Admitting: Hematology

## 2019-09-07 LAB — CANCER ANTIGEN 19-9: CA 19-9: 59 U/mL — ABNORMAL HIGH (ref 0–35)

## 2019-09-07 NOTE — Telephone Encounter (Signed)
Scheduled appt per 1/14 sch message - pt to get an updated schedule next visit.  \

## 2019-09-13 ENCOUNTER — Inpatient Hospital Stay: Payer: BLUE CROSS/BLUE SHIELD

## 2019-09-13 ENCOUNTER — Other Ambulatory Visit: Payer: Self-pay

## 2019-09-13 ENCOUNTER — Telehealth: Payer: Self-pay

## 2019-09-13 VITALS — BP 105/70 | HR 79 | Temp 97.7°F | Resp 18

## 2019-09-13 DIAGNOSIS — C251 Malignant neoplasm of body of pancreas: Secondary | ICD-10-CM

## 2019-09-13 DIAGNOSIS — C259 Malignant neoplasm of pancreas, unspecified: Secondary | ICD-10-CM

## 2019-09-13 DIAGNOSIS — Z7189 Other specified counseling: Secondary | ICD-10-CM

## 2019-09-13 DIAGNOSIS — C787 Secondary malignant neoplasm of liver and intrahepatic bile duct: Secondary | ICD-10-CM

## 2019-09-13 DIAGNOSIS — Z95828 Presence of other vascular implants and grafts: Secondary | ICD-10-CM

## 2019-09-13 LAB — CBC WITH DIFFERENTIAL/PLATELET
Abs Immature Granulocytes: 0.01 10*3/uL (ref 0.00–0.07)
Basophils Absolute: 0 10*3/uL (ref 0.0–0.1)
Basophils Relative: 1 %
Eosinophils Absolute: 0 10*3/uL (ref 0.0–0.5)
Eosinophils Relative: 1 %
HCT: 25.4 % — ABNORMAL LOW (ref 36.0–46.0)
Hemoglobin: 8.3 g/dL — ABNORMAL LOW (ref 12.0–15.0)
Immature Granulocytes: 1 %
Lymphocytes Relative: 19 %
Lymphs Abs: 0.3 10*3/uL — ABNORMAL LOW (ref 0.7–4.0)
MCH: 35.3 pg — ABNORMAL HIGH (ref 26.0–34.0)
MCHC: 32.7 g/dL (ref 30.0–36.0)
MCV: 108.1 fL — ABNORMAL HIGH (ref 80.0–100.0)
Monocytes Absolute: 0.2 10*3/uL (ref 0.1–1.0)
Monocytes Relative: 15 %
Neutro Abs: 1 10*3/uL — ABNORMAL LOW (ref 1.7–7.7)
Neutrophils Relative %: 63 %
Platelets: 187 10*3/uL (ref 150–400)
RBC: 2.35 MIL/uL — ABNORMAL LOW (ref 3.87–5.11)
RDW: 17.2 % — ABNORMAL HIGH (ref 11.5–15.5)
WBC: 1.5 10*3/uL — ABNORMAL LOW (ref 4.0–10.5)
nRBC: 0 % (ref 0.0–0.2)

## 2019-09-13 LAB — COMPREHENSIVE METABOLIC PANEL
ALT: 33 U/L (ref 0–44)
AST: 42 U/L — ABNORMAL HIGH (ref 15–41)
Albumin: 3.8 g/dL (ref 3.5–5.0)
Alkaline Phosphatase: 167 U/L — ABNORMAL HIGH (ref 38–126)
Anion gap: 8 (ref 5–15)
BUN: 19 mg/dL (ref 6–20)
CO2: 27 mmol/L (ref 22–32)
Calcium: 9.2 mg/dL (ref 8.9–10.3)
Chloride: 104 mmol/L (ref 98–111)
Creatinine, Ser: 0.86 mg/dL (ref 0.44–1.00)
GFR calc Af Amer: >60 mL/min (ref >60)
GFR calc non Af Amer: >60 mL/min (ref >60)
Glucose, Bld: 110 mg/dL — ABNORMAL HIGH (ref 70–99)
Potassium: 3.6 mmol/L (ref 3.5–5.1)
Sodium: 139 mmol/L (ref 135–145)
Total Bilirubin: 0.6 mg/dL (ref 0.3–1.2)
Total Protein: 7 g/dL (ref 6.5–8.1)

## 2019-09-13 MED ORDER — SODIUM CHLORIDE 0.9 % IV SOLN
600.0000 mg/m2 | Freq: Once | INTRAVENOUS | Status: AC
  Administered 2019-09-13: 912 mg via INTRAVENOUS
  Filled 2019-09-13: qty 23.99

## 2019-09-13 MED ORDER — HEPARIN SOD (PORK) LOCK FLUSH 100 UNIT/ML IV SOLN
500.0000 [IU] | Freq: Once | INTRAVENOUS | Status: AC | PRN
  Administered 2019-09-13: 16:00:00 500 [IU]
  Filled 2019-09-13: qty 5

## 2019-09-13 MED ORDER — SODIUM CHLORIDE 0.9 % IV SOLN
30.0000 mg/m2 | Freq: Once | INTRAVENOUS | Status: DC
  Filled 2019-09-13: qty 4

## 2019-09-13 MED ORDER — SODIUM CHLORIDE 0.9% FLUSH
10.0000 mL | Freq: Once | INTRAVENOUS | Status: AC
  Administered 2019-09-13: 10 mL
  Filled 2019-09-13: qty 10

## 2019-09-13 MED ORDER — DEXAMETHASONE SODIUM PHOSPHATE 10 MG/ML IJ SOLN
10.0000 mg | Freq: Once | INTRAMUSCULAR | Status: AC
  Administered 2019-09-13: 12:00:00 10 mg via INTRAVENOUS

## 2019-09-13 MED ORDER — SODIUM CHLORIDE 0.9 % IV SOLN
30.0000 mg/m2 | Freq: Once | INTRAVENOUS | Status: AC
  Administered 2019-09-13: 14:00:00 40 mg via INTRAVENOUS
  Filled 2019-09-13: qty 4

## 2019-09-13 MED ORDER — SODIUM CHLORIDE 0.9% FLUSH
10.0000 mL | INTRAVENOUS | Status: DC | PRN
  Administered 2019-09-13: 16:00:00 10 mL
  Filled 2019-09-13: qty 10

## 2019-09-13 MED ORDER — DEXAMETHASONE SODIUM PHOSPHATE 10 MG/ML IJ SOLN
INTRAMUSCULAR | Status: AC
  Filled 2019-09-13: qty 1

## 2019-09-13 MED ORDER — CAPECITABINE 500 MG PO TABS: ORAL_TABLET | ORAL | 2 refills | Status: DC

## 2019-09-13 MED ORDER — SODIUM CHLORIDE 0.9 % IV SOLN
Freq: Once | INTRAVENOUS | Status: AC
  Filled 2019-09-13: qty 250

## 2019-09-13 NOTE — Patient Instructions (Signed)

## 2019-09-13 NOTE — Patient Instructions (Signed)
[facility] Discharge Instructions for Patients Receiving Chemotherapy  Today you received the following chemotherapy agents: gemcitabine and docetaxel.  To help prevent nausea and vomiting after your treatment, we encourage you to take your nausea medication as directed.   If you develop nausea and vomiting that is not controlled by your nausea medication, call the clinic.   BELOW ARE SYMPTOMS THAT SHOULD BE REPORTED IMMEDIATELY:  *FEVER GREATER THAN 100.5 F  *CHILLS WITH OR WITHOUT FEVER  NAUSEA AND VOMITING THAT IS NOT CONTROLLED WITH YOUR NAUSEA MEDICATION  *UNUSUAL SHORTNESS OF BREATH  *UNUSUAL BRUISING OR BLEEDING  TENDERNESS IN MOUTH AND THROAT WITH OR WITHOUT PRESENCE OF ULCERS  *URINARY PROBLEMS  *BOWEL PROBLEMS  UNUSUAL RASH Items with * indicate a potential emergency and should be followed up as soon as possible.  Feel free to call the clinic should you have any questions or concerns. The clinic phone number is [phone].  Please show the [place] at check-in to the Emergency Department and triage nurse.

## 2019-09-13 NOTE — Telephone Encounter (Signed)
Morgan Dawson transferred Promacta rx to [facility].  Laurence Compton, PhD from [place] called for refill. 2 given verbally.

## 2019-09-13 NOTE — Progress Notes (Signed)
Pt's ANC today is 1.0 - ok to treat per Dr. Burr Medico.

## 2019-09-15 ENCOUNTER — Telehealth: Payer: Self-pay

## 2019-09-15 NOTE — Telephone Encounter (Signed)
Oral Oncology Patient Advocate Encounter  Received notification from [facility] that prior authorization for Xeloda is required.  PA submitted on CoverMyMeds Key [id number] Status is pending  Oral Oncology Clinic will continue to follow.  [facility] Patient [place] Phone [phone] Fax [phone] 09/15/2019 3:39 PM

## 2019-09-18 ENCOUNTER — Inpatient Hospital Stay: Payer: BLUE CROSS/BLUE SHIELD

## 2019-09-18 ENCOUNTER — Other Ambulatory Visit: Payer: Self-pay

## 2019-09-18 VITALS — BP 112/68 | HR 72 | Temp 98.2°F | Resp 18

## 2019-09-18 DIAGNOSIS — Z95828 Presence of other vascular implants and grafts: Secondary | ICD-10-CM

## 2019-09-18 DIAGNOSIS — C259 Malignant neoplasm of pancreas, unspecified: Secondary | ICD-10-CM | POA: Diagnosis not present

## 2019-09-18 DIAGNOSIS — C787 Secondary malignant neoplasm of liver and intrahepatic bile duct: Secondary | ICD-10-CM

## 2019-09-18 MED ORDER — FILGRASTIM-SNDZ 300 MCG/0.5ML IJ SOSY
PREFILLED_SYRINGE | INTRAMUSCULAR | Status: AC
  Filled 2019-09-18: qty 0.5

## 2019-09-18 MED ORDER — FILGRASTIM-SNDZ 300 MCG/0.5ML IJ SOSY
300.0000 ug | PREFILLED_SYRINGE | Freq: Once | INTRAMUSCULAR | Status: AC
  Administered 2019-09-18: 300 ug via SUBCUTANEOUS

## 2019-09-18 NOTE — Patient Instructions (Signed)

## 2019-09-21 NOTE — Progress Notes (Signed)
[place]   [phone] [phone]   Clinic Follow up Note   Patient Care Team: [name], MD as PCP - General (Family Medicine) [name], MD as Consulting Physician (Otolaryngology) [name], MD as Consulting Physician (Oncology)  Date of Service:  09/27/2019  CHIEF COMPLAINT: F/u of pancreatic cancer  SUMMARY OF ONCOLOGIC HISTORY: Oncology History Overview Note  Cancer Staging Pancreatic cancer [facility]) Staging form: Exocrine Pancreas, AJCC 8th Edition - Clinical stage from 08/06/2017: Stage IV (cTX, cN0, pM1) - Signed by [name], MD on 08/12/2017     Pancreatic cancer ([name])  07/23/2017 Imaging   US abdomen limited RUQ 07/23/17 IMPRESSION: 1. Cholelithiasis.  No secondary signs of acute cholecystitis. 2. Multiple solid liver masses measuring up to 6.5 cm in the left lobe of the liver, evaluation for metastatic disease is recommended. These results will be called to the ordering clinician or representative by the Radiologist Assistant, and communication documented in the PACS or zVision Dashboard.   07/23/2017 Imaging   CT Abdomen W Contrast 07/23/17 IMPRESSION: 1. Widespread metastatic disease throughout the liver. No clear primary malignancy identified in the abdomen. The pelvis was not imaged. Tissue sampling recommended. 2. Probable adenopathy superior to the pancreatic tail. No evidence of pancreatic mass. 3. Suspected incidental hemangioma inferiorly in the right hepatic lobe. 4. Nonspecific nodularity in the breasts. The patient has undergone recent (03/25/2017 and 04/01/2017) mammography and ultrasound.   07/29/2017 Initial Diagnosis   Metastasis to liver of unknown origin ([place])   07/29/2017 PET scan   PET 07/29/17  IMPRESSION: 1. Numerous bulky liver masses are hypermetabolic compatible with malignancy. 2. Accentuated activity within or along the pancreatic tail, likely represent a primary pancreatic tumor.  Consider pancreatic protocol MRI to further work this up. 3. The peripancreatic lymph node shown above the pancreatic tail is mildly hypermetabolic favoring malignancy. 4.  Prominent stool throughout the colon favors constipation. 5. 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   08/05/2017 Pathology Results   Liver Biopsy  Diagnosis 08/05/17 Liver, needle/core biopsy - CARCINOMA. - SEE COMMENT. Microscopic Comment The malignant cells are positive for cytokeratin 7. They are negative for arginase, CDX2, cytokeratin 20, estrogen receptor, GATA-3, GCDFP, Glypican 3, Hep Par 1, Napsin A, and TTF-1. This immunohistochemical is nonspecific. Possibly primary sources include pancreatobiliary and upper gastrointestinal. Radiologic correlation is necessary. Of note, organ specific markers (GATA-3, GCDFP-breast, TTF-1, Napsin A-lung, and CDX2-colon) are negative. (JBK:ecj 08/09/2017)   08/21/2017 - 02/10/2018 Chemotherapy   FOLFIRINOX every 2 weeks starting 08/21/17. Dose reduced due to neuropahty and cytopenia    10/14/2017 Imaging   CT CAP WO Contrast 10/14/17 IMPRESSION: Evidence of known numerous liver metastases without significant interval change. No other evidence of metastatic disease within the chest, abdomen or pelvis. Cholelithiasis. Tiny pericardial effusion.   12/02/2017 Genetic Testing   Negative for pathogenic mutation.  The genes analyzed were the 83 genes on [facility]'s Multi-Cancer panel (ALK, APC, ATM, AXIN2, BAP1, BARD1, BLM, BMPR1A, BRCA1, BRCA2, BRIP1, CASR, CDC73, CDH1, CDK4, CDKN1B, CDKN1C, CDKN2A, CEBPA, CHEK2, CTNNA1, DICER1, DIS3L2, EGFR, EPCAM, FH, FLCN, GATA2, GPC3, GREM1, HOXB13, HRAS, KIT, MAX, MEN1, MET, MITF, MLH1, MSH2, MSH3, MSH6, MUTYH, NBN, NF1, NF2, NTHL1, PALB2, PDGFRA, PHOX2B, PMS2, POLD1, POLE, POT1, PRKAR1A, PTCH1, PTEN, RAD50, RAD51C, RAD51D, RB1, RECQL4, RET, RUNX1, SDHA, SDHAF2, SDHB, SDHC, SDHD, SMAD4, SMARCA4, SMARCB1, SMARCE1, STK11, SUFU, TERC, TERT,  TMEM127, TP53, TSC1, TSC2, VHL, WRN, WT1).   12/17/2017 PET scan   IMPRESSION: 1. Although the liver metastases  are still visible on the CT images, they are significantly smaller and retain no abnormal metabolic activity, consistent with response to therapy. 2. No abnormal activity within the pancreas. 3. No disease progression identified. 4. Cholelithiasis.   02/21/2018 PET scan   02/21/2018 PET Scan  IMPRESSION: 1. There are 2 new small nodules identified within the right lung which measure up to 5 mm. These are too small to reliably characterize by PET-CT, but warrant close interval follow-up. 2. Again noted are multifocal liver metastasis. The target lesion within the left lobe is slightly decreased in size when compared with the previous exam. Similar to previous exam there is no abnormal hypermetabolism above background liver activity identified within the liver lesions. 3. Gallstones.   02/23/2018 - 09/04/2018 Chemotherapy   5-FU pump infusion and liposomal irinotecan, every 2 weeks.  First cycle dose reduced due to cytopenia in the travel. Stopped on 08/25/2018 due to disease progression.    05/17/2018 Imaging   05/17/2018 PET Scan IMPRESSION: 1. Mixed response. The 2 small right lung nodules have decreased in size in the interval. Single focus of increased uptake within the liver is new from 02/21/2018 and concerning for recurrent metabolically active liver metastasis. 2. Splenomegaly.  New from previous exam. 3. Diffusely increased bone marrow activity, likely reflecting treatment related changes.   09/01/2018 PET scan   PET 09/01/18 IMPRESSION: 1. Unfortunately there recurrence of hepatic metastasis with multiple new hypermetabolic lesions in LEFT and RIGHT hepatic lobe. 2. New extra hepatic site of malignancy which appears associated the mid pancreas. Difficult to define lesion on noncontrast exam. 3. Diffuse marrow activity is favored benign. 4. No evidence of pulmonary  metastasis.   09/12/2018 - 03/27/2019 Chemotherapy   second-line Gemcitabine and Abraxane for 2 weeks on, 1 week off starting 09/12/2018. Due to neuropathy, I will start her with dose reduced Abraxane. Stopped after 03/27/19 due to disease progression.    12/14/2018 Imaging   CT CAP  IMPRESSION: CT demonstrates acute cholecystitis, with choledocholithiasis of the cystic duct.  These results were discussed by telephone at the time of interpretation on 12/14/2018 at 5:05 pm with Dr. Burr Medico.  Redemonstration of known liver metastases, with positive response to therapy, interval reduction in size of all lesions, with multiple demonstrating significant internal necrosis.  No acute finding of the chest.  Ancillary findings as above.   03/21/2019 Imaging   CT CAP W Contrast IMPRESSION: 1. Numerous rim enhancing liver metastases show overall trend towards mild progression although 1 of the index lesions is stable in the interval. 2. Interval development of portocaval lymphadenopathy concerning for disease progression. 3. Slight progression of main duct dilatation in the pancreas with abrupt cut off in the region of the body. 4. Interval development of a 9 mm subpleural nodule at the right base along the posterior hemidiaphragm. Close attention on follow-up recommended as metastatic disease not excluded. 5. New splenomegaly. 6. Cholelithiasis with diffuse gallbladder wall thickening. Gallbladder wall thickening is decreased in the interval. Stones and sludge noted in the gallbladder lumen.   04/09/2019 Imaging   CT AP W Contrast 04/09/19  IMPRESSION: 1. Multiple gallstones in lumen gallbladder without evidence acute cholecystitis. No significant change from prior. No biliary duct dilatation. Common bile duct normal. 2. Widespread hepatic metastasis unchanged from comparison CT. 3. Chronic pancreatic duct dilatation in the body and tail unchanged. 4. No bowel obstruction. 5. Small  amount of intraperitoneal free fluid unchanged.   04/10/2019 -  Chemotherapy   Third-line GTX with Xeloda  104m BID day 1-14, off for 1 week with Docetaxel and Gemcitbine on day 4 and day 11 every 3 weeks starting oral chemo on 04/10/19.    07/03/2019 Imaging   CT CAP W Contrast  IMPRESSION: 1. Overall stable diffuse hepatic metastatic disease. No new/progressive findings. 2. Stable to slightly larger periportal lymph node. 3. Stable CT appearance of the pancreas. Abrupt cut off of a dilated main pancreatic duct in the body/head junction region without obvious pancreatic lesion. 4. No pulmonary nodules are identified at the lung bases. 5. Stable mild splenomegaly. 6. No new/acute findings.     Pancreatic cancer metastasized to liver ([place]  08/11/2017 Initial Diagnosis   Pancreatic cancer metastasized to liver ([facility]      CURRENT THERAPY:  Third-line GTX with Xeloda10035mID day 1-14, off for 1 week with Docetaxel3054m2and Gemcitabine750m20mon day 4 and day 11 every 3 weeks starting oral chemo on 04/10/19.Starting 07/06/19 she will reduce Xeloda to 1000mg83mthe AM and 500mg 36mhe PM due to low blood counts.  INTERVAL HISTORY:  JaniceSADEE OSLANDre for a follow up and treatment. She presents to the clinic alone. She notes she is doing well and stable. She notes no issues with gallbladder lately. She notes her eating and appetite is fair but has been able to maintain weight. She notes occasional diarrhea controlled by imodium. She notes dry skin and peeling with loss of 1 fingernail on Xeloda. She note minimal hand foot syndrome. She uses a lot of lotion. She denies worsened neuropathy on Xeloda.  She has been taking potasium 10meq 85m She only occasionally uses Tramadol as needed. She has been tolerating Promacta. Gabapentin 100mg 2-45mmes a day.  She notes she is wanting COVID vaccine before she plans to have trip for vacation in April.      REVIEW OF SYSTEMS:     Constitutional: Denies fevers, chills (+) Fair appetite and stable weight  Eyes: Denies blurriness of vision Ears, nose, mouth, throat, and face: Denies mucositis or sore throat Respiratory: Denies cough, dyspnea or wheezes Cardiovascular: Denies palpitation, chest discomfort or lower extremity swelling Gastrointestinal:  Denies nausea, heartburn (+) Occasional diarrhea, controlled  Skin: Denies abnormal skin rashes (+) Dry skin and peeling (+) Minimal hand foot syndrome   Lymphatics: Denies new lymphadenopathy or easy bruising Neurological: (+) Stable Neuropathy  Behavioral/Psych: Mood is stable, no new changes  All other systems were reviewed with the patient and are negative.  MEDICAL HISTORY:  Past Medical History:  Diagnosis Date   Diarrhea of presumed infectious origin 01/12/2018   Gastroenteritis 01/12/2018   Genetic testing 12/02/2017   Multi-Cancer panel (83 genes) @ [facility] - No pathogenic mutations detected   Meniere disease 2016   met pancreatic ca to liver dx'd 07/2017   Pancreatitis     SURGICAL HISTORY: Past Surgical History:  Procedure Laterality Date   CERVICAL ABLATION     ENDOMETRIAL ABLATION  2011   IR CHOLANGIOGRAM EXISTING TUBE  01/09/2019   IR FLUORO GUIDE PORT INSERTION RIGHT  08/12/2017   IR PERC CHOLECYSTOSTOMY  12/15/2018   IR US GUIDE[place]ASC ACCESS RIGHT  08/12/2017   MANDIBLE SURGERY  1999    I have reviewed the social history and family history with the patient and they are unchanged from previous note.  ALLERGIES:  has No Known Allergies.  MEDICATIONS:  Current Outpatient Medications  Medication Sig Dispense Refill   b complex vitamins tablet Take 1 tablet by mouth daily.  capecitabine (XELODA) 500 MG tablet TAKE 2 TABLETS BY MOUTH  TWICE DAILY WITHIN 30  MINUTES OF MEALS ON DAYS 1  TO 14 OF EACH 21 DAY CYCLE 56 tablet 2   Cholecalciferol (VITAMIN D3 GUMMIES ADULT) 25 MCG (1000 UT) CHEW Chew by mouth. Taking 2 gummies      cyclobenzaprine (FLEXERIL) 5 MG tablet Take 1 tablet (5 mg total) by mouth 3 (three) times daily as needed for muscle spasms. 30 tablet 0   eltrombopag (PROMACTA) 50 MG tablet Take 1 tablet (50 mg total) by mouth daily. Take on an empty stomach 1 hour before a meal or 2 hours after 30 tablet 2   gabapentin (NEURONTIN) 100 MG capsule Take 1 capsule (100 mg total) by mouth 3 (three) times daily. 270 capsule 2   lidocaine-prilocaine (EMLA) cream Apply 1 application topically as needed. 30 g 2   loperamide (IMODIUM) 2 MG capsule Take 1 capsule (2 mg total) by mouth as needed for diarrhea or loose stools. 30 capsule 0   loratadine (CLARITIN) 10 MG tablet Take 10 mg by mouth daily as needed for allergies.     magic mouthwash w/lidocaine SOLN Take 10 mLs by mouth 3 (three) times daily as needed for mouth pain. Swish and spit 10 ML by mouth 3 times daily as needed for mouth pain. 240 mL 0   meclizine (ANTIVERT) 25 MG tablet Take 25 mg by mouth 3 (three) times daily as needed for dizziness.     ondansetron (ZOFRAN-ODT) 8 MG disintegrating tablet TAKE 1 TABLET BY MOUTH EVERY 8 HOURS AS NEEDED FOR NAUSE OR VOMITING 40 tablet 0   potassium chloride (KLOR-CON) 10 MEQ tablet Take 1 tablet (10 mEq total) by mouth 4 (four) times daily. 360 tablet 2   prochlorperazine (COMPAZINE) 10 MG tablet Take 1 tablet (10 mg total) by mouth every 6 (six) hours as needed for nausea or vomiting. 40 tablet 2   Rivaroxaban (XARELTO) 15 MG TABS tablet TAKE 1 TABLET(15 MG) BY MOUTH DAILY 90 tablet 1   traMADol (ULTRAM) 50 MG tablet Take 1 tablet (50 mg total) by mouth every 6 (six) hours as needed for moderate pain or severe pain. 30 tablet 0   No current facility-administered medications for this visit.   Facility-Administered Medications Ordered in Other Visits  Medication Dose Route Frequency Provider Last Rate Last Admin   DOCEtaxel (TAXOTERE) 40 mg in sodium chloride 0.9 % 100 mL chemo infusion  30 mg/m2 (Treatment  Plan Recorded) Intravenous Once [name], MD       gemcitabine (GEMZAR) 912 mg in sodium chloride 0.9 % 250 mL chemo infusion  600 mg/m2 (Treatment Plan Recorded) Intravenous Once [name], MD 219 mL/hr at 09/27/19 1330 912 mg at 09/27/19 1330   heparin lock flush 100 unit/mL  500 Units Intracatheter Once PRN [name], MD       sodium chloride flush (NS) 0.9 % injection 10 mL  10 mL Intracatheter Once [name], MD       sodium chloride flush (NS) 0.9 % injection 10 mL  10 mL Intracatheter PRN [name], MD        PHYSICAL EXAMINATION: ECOG PERFORMANCE STATUS: 1 - Symptomatic but completely ambulatory  Vitals:   09/27/19 1143  BP: 106/73  Pulse: 80  Resp: 17  Temp: 98 F (36.7 C)  SpO2: 100%   Filed Weights   09/27/19 1143  Weight: 107 lb 11.2 oz (48.9 kg)    Due to COVID19 we will  limit examination to appearance. Patient had no complaints.  GENERAL:alert, no distress and comfortable SKIN: skin color normal, no rashes or significant lesions EYES: normal, Conjunctiva are pink and non-injected, sclera clear  NEURO: alert & oriented x 3 with fluent speech   LABORATORY DATA:  I have reviewed the data as listed CBC Latest Ref Rng & Units 09/27/2019 09/13/2019 09/06/2019  WBC 4.0 - 10.5 K/uL 4.7 1.5(L) 3.8(L)  Hemoglobin 12.0 - 15.0 g/dL 8.9(L) 8.3(L) 8.8(L)  Hematocrit 36.0 - 46.0 % 28.0(L) 25.4(L) 27.1(L)  Platelets 150 - 400 K/uL 287 187 284     CMP Latest Ref Rng & Units 09/27/2019 09/13/2019 09/06/2019  Glucose 70 - 99 mg/dL 113(H) 110(H) 114(H)  BUN 6 - 20 mg/dL 15 19 13   Creatinine 0.44 - 1.00 mg/dL 1.00 0.86 0.97  Sodium 135 - 145 mmol/L 139 139 140  Potassium 3.5 - 5.1 mmol/L 3.8 3.6 3.8  Chloride 98 - 111 mmol/L 103 104 105  CO2 22 - 32 mmol/L 28 27 24   Calcium 8.9 - 10.3 mg/dL 8.9 9.2 8.8(L)  Total Protein 6.5 - 8.1 g/dL 7.1 7.0 7.0  Total Bilirubin 0.3 - 1.2 mg/dL 0.9 0.6 0.6  Alkaline Phos 38 - 126 U/L 187(H) 167(H) 154(H)  AST 15 - 41 U/L 47(H) 42(H) 40    ALT 0 - 44 U/L 62(H) 33 24      RADIOGRAPHIC STUDIES: I have personally reviewed the radiological images as listed and agreed with the findings in the report. No results found.   ASSESSMENT & PLAN:  Morgan Dawson is a 59 y.o. female with    1. 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 She progressed on this based on 08/2018 PET scan. -She mildly progressed onsecond-line Gemcitabine and Abraxanein liver and new lung nodule.  -She is currently onthird-line GTX with Xeloda 1049m BID 2 weeks on/1 week off andDocetaxelandGemcitabineon day 4 and 11 q3weeks starting 04/10/19. Granix has been added to week 3 due to neutropenia. Starting 07/03/19 Xeloda has been decreased to 10059min the AM and 50021mn the PM due to cytopenias -S/p C8 she is tolerating well with occasional diarrhea, stable neuropathy and mild hand foot syndrome. Labs reviewed, Hg 8.9, overall adequate to proceed with C9D1 Gemcitabine/Docetaxel. She will continue Xeloda 1000m68m the AM and 500mg36mthe PM.  -Next CT CAP in 2-3 weeks  -F/u in 3 weeks with scan results.  -She is interested in [place] when available to her, I encouraged her to proceed. She plans to travel in 11/2019, I highly recommend she continue COVID19 precautions.   2. Abdominal pain -Continue tramadol PRN for pain, has not needed much recently, no fever recently    3. malnutrition -Due to chemo and underlying metastatic cancer. -Continue nutritional supplement and f/u with dietician. -Appetite and eating has been fair but she has been able to maintain weight well.   4. Transaminitis -Due to underlying chemo and malignancy  5. Pancytopenia, secondary to chemo -Required chemo dose reductions. -S/p blood transfusion as needed withHg<8. Last on  09/20/18 -OnPromacta, willcontinue. Due to worsening thrombocytopenia from chemo, increasedto 50mg 53my starting 05/31/19. -Granix has been added to third week and Xeloda dose reduced starting 07/03/19   6. Hypokalemia, CKD Stage III -Sheis currently on KCL 10meq 86m-overall stable  7.Peripheral neuropathy, grade 2  -Currently on Neurontin 200 mg  at night and Bcomplex.She used ice bags during infusion. -Mild and stable in hands and has mildly progressed in feet with current chemo. Will continue to monitor.Stable.   8.H/o Acute cholecystitis, status post cholecystectomy tube placement 4/23/20per IR -She was seen by surgery during herhospitalization, surgery was not recommended due to high risk of complication, and concerns for cancer progression when holding chemotherapy for surgery -Drain intact,currently has decreased to 34m slightlybloody drainage per day -afebrileand no chills. -Her CT CAP from 12/14/18 shows she has numerus gallstones. Will monitor for any inflammation. -Imaging in 12/2018 consistent with cholecystitis with cystic duct obstruction. No leak on HIDA. -She was previously given prophylactic 5-day course levaquin on 04/13/19 due to fever spikes during chemo -no evidence of recurrent infection recently.   9. LE DVT -Diagnosed on December 15, 2018, when she was hospitalized for cholecystitis -Due to her bloody biliary drainage, and moderate thrombocytopenia from chemotherapy, she is on low-doseXarelto125mdaily (no loading dose), she is tolerating well and will continue  10.Goal of care discussion -She is full code now -She understands her cancer is incurable at this stage, and the goal of therapy is palliative, to prolong his life, and prevent cancer related symptoms   Plan -Labs reviewed and adequate to proceed with C9D1 Gemcitabine/Docetaxeltoday, D8 chemo next week -Continue Xeloda 100010mn the AM and 500m78m the PM.  -Lab, flush, chemo  Gemcitabine/Docetaxel in 3, 4, 6, 7 weeks  -F/u in 3 weeks with CT CAP W Contrast and lab a few days before.    No problem-specific Assessment & Plan notes found for this encounter.   Orders Placed This Encounter  Procedures   CT Abdomen Pelvis W Contrast    Standing Status:   Future    Standing Expiration Date:   09/26/2020    Order Specific Question:   If indicated for the ordered procedure, I authorize the administration of contrast media per Radiology protocol    Answer:   Yes    Order Specific Question:   Is patient pregnant?    Answer:   No    Order Specific Question:   [facility]    [facility]   Is Oral Contrast requested for this exam?    Answer:   Yes, Per Radiology protocol    Order Specific Question:   Radiology Contrast Protocol - do NOT remove file path    Answer:   \[email or web address]   CT Chest W Contrast    Standing Status:   Future    Standing Expiration Date:   09/26/2020    Order Specific Question:   If indicated for the ordered procedure, I authorize the administration of contrast media per Radiology protocol    Answer:   Yes    Order Specific Question:   Is patient pregnant?    Answer:   No    Order Specific Question:   [facility]?    [facility]:   Radiology Contrast Protocol - do NOT remove file path    Answer:   \[email or web address]   All questions were answered. The patient knows to call the clinic with any problems, questions or concerns. No barriers to learning was detected. The total time spent in the appointment was 30 minutes.     Vinson Tietze [name] 09/27/2019   I, AmoyJoslyn Devon acting as scribe for Mallisa [place] [name].   I  have reviewed the above documentation for accuracy and completeness, and I agree with the above.

## 2019-09-22 ENCOUNTER — Other Ambulatory Visit: Payer: Self-pay | Admitting: Hematology

## 2019-09-22 DIAGNOSIS — C251 Malignant neoplasm of body of pancreas: Secondary | ICD-10-CM

## 2019-09-23 ENCOUNTER — Other Ambulatory Visit: Payer: Self-pay | Admitting: Hematology

## 2019-09-27 ENCOUNTER — Other Ambulatory Visit: Payer: Self-pay

## 2019-09-27 ENCOUNTER — Inpatient Hospital Stay: Payer: BLUE CROSS/BLUE SHIELD

## 2019-09-27 ENCOUNTER — Encounter: Payer: Self-pay | Admitting: Hematology

## 2019-09-27 ENCOUNTER — Inpatient Hospital Stay (HOSPITAL_BASED_OUTPATIENT_CLINIC_OR_DEPARTMENT_OTHER): Payer: BLUE CROSS/BLUE SHIELD | Admitting: Hematology

## 2019-09-27 ENCOUNTER — Inpatient Hospital Stay: Payer: BLUE CROSS/BLUE SHIELD | Attending: Hematology

## 2019-09-27 VITALS — BP 106/73 | HR 80 | Temp 98.0°F | Resp 17 | Ht 65.0 in | Wt 107.7 lb

## 2019-09-27 DIAGNOSIS — E876 Hypokalemia: Secondary | ICD-10-CM

## 2019-09-27 DIAGNOSIS — Z5111 Encounter for antineoplastic chemotherapy: Secondary | ICD-10-CM | POA: Diagnosis present

## 2019-09-27 DIAGNOSIS — C787 Secondary malignant neoplasm of liver and intrahepatic bile duct: Secondary | ICD-10-CM

## 2019-09-27 DIAGNOSIS — C251 Malignant neoplasm of body of pancreas: Secondary | ICD-10-CM

## 2019-09-27 DIAGNOSIS — Z7189 Other specified counseling: Secondary | ICD-10-CM

## 2019-09-27 DIAGNOSIS — C259 Malignant neoplasm of pancreas, unspecified: Secondary | ICD-10-CM

## 2019-09-27 DIAGNOSIS — I82402 Acute embolism and thrombosis of unspecified deep veins of left lower extremity: Secondary | ICD-10-CM

## 2019-09-27 DIAGNOSIS — Z95828 Presence of other vascular implants and grafts: Secondary | ICD-10-CM

## 2019-09-27 DIAGNOSIS — Z5189 Encounter for other specified aftercare: Secondary | ICD-10-CM | POA: Insufficient documentation

## 2019-09-27 LAB — CBC WITH DIFFERENTIAL/PLATELET
Abs Immature Granulocytes: 0.05 10*3/uL (ref 0.00–0.07)
Basophils Absolute: 0 10*3/uL (ref 0.0–0.1)
Basophils Relative: 1 %
Eosinophils Absolute: 0 10*3/uL (ref 0.0–0.5)
Eosinophils Relative: 0 %
HCT: 28 % — ABNORMAL LOW (ref 36.0–46.0)
Hemoglobin: 8.9 g/dL — ABNORMAL LOW (ref 12.0–15.0)
Immature Granulocytes: 1 %
Lymphocytes Relative: 7 %
Lymphs Abs: 0.3 10*3/uL — ABNORMAL LOW (ref 0.7–4.0)
MCH: 35 pg — ABNORMAL HIGH (ref 26.0–34.0)
MCHC: 31.8 g/dL (ref 30.0–36.0)
MCV: 110.2 fL — ABNORMAL HIGH (ref 80.0–100.0)
Monocytes Absolute: 0.7 10*3/uL (ref 0.1–1.0)
Monocytes Relative: 15 %
Neutro Abs: 3.5 10*3/uL (ref 1.7–7.7)
Neutrophils Relative %: 76 %
Platelets: 287 10*3/uL (ref 150–400)
RBC: 2.54 MIL/uL — ABNORMAL LOW (ref 3.87–5.11)
RDW: 17.6 % — ABNORMAL HIGH (ref 11.5–15.5)
WBC: 4.7 10*3/uL (ref 4.0–10.5)
nRBC: 0 % (ref 0.0–0.2)

## 2019-09-27 LAB — COMPREHENSIVE METABOLIC PANEL
ALT: 62 U/L — ABNORMAL HIGH (ref 0–44)
AST: 47 U/L — ABNORMAL HIGH (ref 15–41)
Albumin: 3.9 g/dL (ref 3.5–5.0)
Alkaline Phosphatase: 187 U/L — ABNORMAL HIGH (ref 38–126)
Anion gap: 8 (ref 5–15)
BUN: 15 mg/dL (ref 6–20)
CO2: 28 mmol/L (ref 22–32)
Calcium: 8.9 mg/dL (ref 8.9–10.3)
Chloride: 103 mmol/L (ref 98–111)
Creatinine, Ser: 1 mg/dL (ref 0.44–1.00)
GFR calc Af Amer: >60 mL/min (ref >60)
GFR calc non Af Amer: >60 mL/min (ref >60)
Glucose, Bld: 113 mg/dL — ABNORMAL HIGH (ref 70–99)
Potassium: 3.8 mmol/L (ref 3.5–5.1)
Sodium: 139 mmol/L (ref 135–145)
Total Bilirubin: 0.9 mg/dL (ref 0.3–1.2)
Total Protein: 7.1 g/dL (ref 6.5–8.1)

## 2019-09-27 MED ORDER — SODIUM CHLORIDE 0.9% FLUSH
10.0000 mL | INTRAVENOUS | Status: DC | PRN
  Administered 2019-09-27: 10 mL
  Filled 2019-09-27: qty 10

## 2019-09-27 MED ORDER — SODIUM CHLORIDE 0.9 % IV SOLN
Freq: Once | INTRAVENOUS | Status: AC
  Filled 2019-09-27: qty 250

## 2019-09-27 MED ORDER — SODIUM CHLORIDE 0.9% FLUSH
10.0000 mL | Freq: Once | INTRAVENOUS | Status: AC
  Administered 2019-09-27: 11:00:00 10 mL
  Filled 2019-09-27: qty 10

## 2019-09-27 MED ORDER — RIVAROXABAN 15 MG PO TABS: ORAL_TABLET | ORAL | 1 refills | Status: DC

## 2019-09-27 MED ORDER — HEPARIN SOD (PORK) LOCK FLUSH 100 UNIT/ML IV SOLN
500.0000 [IU] | Freq: Once | INTRAVENOUS | Status: AC | PRN
  Administered 2019-09-27: 500 [IU]
  Filled 2019-09-27: qty 5

## 2019-09-27 MED ORDER — DEXAMETHASONE SODIUM PHOSPHATE 10 MG/ML IJ SOLN
10.0000 mg | Freq: Once | INTRAMUSCULAR | Status: AC
  Administered 2019-09-27: 10 mg via INTRAVENOUS

## 2019-09-27 MED ORDER — POTASSIUM CHLORIDE ER 10 MEQ PO TBCR: 10.0000 meq | EXTENDED_RELEASE_TABLET | Freq: Four times a day (QID) | ORAL | 2 refills | Status: AC

## 2019-09-27 MED ORDER — SODIUM CHLORIDE 0.9 % IV SOLN
30.0000 mg/m2 | Freq: Once | INTRAVENOUS | Status: AC
  Administered 2019-09-27: 40 mg via INTRAVENOUS
  Filled 2019-09-27: qty 4

## 2019-09-27 MED ORDER — DEXAMETHASONE SODIUM PHOSPHATE 10 MG/ML IJ SOLN
INTRAMUSCULAR | Status: AC
  Filled 2019-09-27: qty 1

## 2019-09-27 MED ORDER — SODIUM CHLORIDE 0.9 % IV SOLN
600.0000 mg/m2 | Freq: Once | INTRAVENOUS | Status: AC
  Administered 2019-09-27: 912 mg via INTRAVENOUS
  Filled 2019-09-27: qty 23.99

## 2019-09-27 NOTE — Patient Instructions (Signed)
[facility] Discharge Instructions for Patients Receiving Chemotherapy  Today you received the following chemotherapy agents: gemcitabine and paclitaxel.   To help prevent nausea and vomiting after your treatment, we encourage you to take your nausea medication as directed.   If you develop nausea and vomiting that is not controlled by your nausea medication, call the clinic.   BELOW ARE SYMPTOMS THAT SHOULD BE REPORTED IMMEDIATELY:  *FEVER GREATER THAN 100.5 F  *CHILLS WITH OR WITHOUT FEVER  NAUSEA AND VOMITING THAT IS NOT CONTROLLED WITH YOUR NAUSEA MEDICATION  *UNUSUAL SHORTNESS OF BREATH  *UNUSUAL BRUISING OR BLEEDING  TENDERNESS IN MOUTH AND THROAT WITH OR WITHOUT PRESENCE OF ULCERS  *URINARY PROBLEMS  *BOWEL PROBLEMS  UNUSUAL RASH Items with * indicate a potential emergency and should be followed up as soon as possible.  Feel free to call the clinic should you have any questions or concerns. The clinic phone number is [phone].  Please show the [place] at check-in to the Emergency Department and triage nurse.

## 2019-09-27 NOTE — Progress Notes (Signed)
error 

## 2019-09-28 ENCOUNTER — Telehealth: Payer: Self-pay | Admitting: Hematology

## 2019-09-28 NOTE — Telephone Encounter (Signed)
Scheduled appt per 2/3 los.  Spoke with pt and she is aware of the appt date and time.

## 2019-10-04 ENCOUNTER — Other Ambulatory Visit: Payer: Self-pay

## 2019-10-04 ENCOUNTER — Inpatient Hospital Stay: Payer: BLUE CROSS/BLUE SHIELD

## 2019-10-04 VITALS — BP 106/68 | HR 88 | Temp 98.2°F | Resp 18

## 2019-10-04 DIAGNOSIS — C259 Malignant neoplasm of pancreas, unspecified: Secondary | ICD-10-CM | POA: Diagnosis not present

## 2019-10-04 DIAGNOSIS — Z95828 Presence of other vascular implants and grafts: Secondary | ICD-10-CM

## 2019-10-04 DIAGNOSIS — C787 Secondary malignant neoplasm of liver and intrahepatic bile duct: Secondary | ICD-10-CM

## 2019-10-04 DIAGNOSIS — C251 Malignant neoplasm of body of pancreas: Secondary | ICD-10-CM

## 2019-10-04 DIAGNOSIS — Z7189 Other specified counseling: Secondary | ICD-10-CM

## 2019-10-04 LAB — CBC WITH DIFFERENTIAL/PLATELET
Abs Immature Granulocytes: 0.01 10*3/uL (ref 0.00–0.07)
Basophils Absolute: 0 10*3/uL (ref 0.0–0.1)
Basophils Relative: 1 %
Eosinophils Absolute: 0 10*3/uL (ref 0.0–0.5)
Eosinophils Relative: 0 %
HCT: 26.4 % — ABNORMAL LOW (ref 36.0–46.0)
Hemoglobin: 8.5 g/dL — ABNORMAL LOW (ref 12.0–15.0)
Immature Granulocytes: 1 %
Lymphocytes Relative: 13 %
Lymphs Abs: 0.3 10*3/uL — ABNORMAL LOW (ref 0.7–4.0)
MCH: 34.8 pg — ABNORMAL HIGH (ref 26.0–34.0)
MCHC: 32.2 g/dL (ref 30.0–36.0)
MCV: 108.2 fL — ABNORMAL HIGH (ref 80.0–100.0)
Monocytes Absolute: 0.3 10*3/uL (ref 0.1–1.0)
Monocytes Relative: 14 %
Neutro Abs: 1.4 10*3/uL — ABNORMAL LOW (ref 1.7–7.7)
Neutrophils Relative %: 71 %
Platelets: 196 10*3/uL (ref 150–400)
RBC: 2.44 MIL/uL — ABNORMAL LOW (ref 3.87–5.11)
RDW: 16.8 % — ABNORMAL HIGH (ref 11.5–15.5)
WBC: 2 10*3/uL — ABNORMAL LOW (ref 4.0–10.5)
nRBC: 0 % (ref 0.0–0.2)

## 2019-10-04 LAB — COMPREHENSIVE METABOLIC PANEL
ALT: 28 U/L (ref 0–44)
AST: 39 U/L (ref 15–41)
Albumin: 3.8 g/dL (ref 3.5–5.0)
Alkaline Phosphatase: 196 U/L — ABNORMAL HIGH (ref 38–126)
Anion gap: 13 (ref 5–15)
BUN: 17 mg/dL (ref 6–20)
CO2: 26 mmol/L (ref 22–32)
Calcium: 9.5 mg/dL (ref 8.9–10.3)
Chloride: 104 mmol/L (ref 98–111)
Creatinine, Ser: 1 mg/dL (ref 0.44–1.00)
GFR calc Af Amer: >60 mL/min (ref >60)
GFR calc non Af Amer: >60 mL/min (ref >60)
Glucose, Bld: 132 mg/dL — ABNORMAL HIGH (ref 70–99)
Potassium: 3.4 mmol/L — ABNORMAL LOW (ref 3.5–5.1)
Sodium: 143 mmol/L (ref 135–145)
Total Bilirubin: 0.6 mg/dL (ref 0.3–1.2)
Total Protein: 7.2 g/dL (ref 6.5–8.1)

## 2019-10-04 MED ORDER — SODIUM CHLORIDE 0.9 % IV SOLN
600.0000 mg/m2 | Freq: Once | INTRAVENOUS | Status: AC
  Administered 2019-10-04: 11:00:00 912 mg via INTRAVENOUS
  Filled 2019-10-04: qty 23.99

## 2019-10-04 MED ORDER — SODIUM CHLORIDE 0.9% FLUSH
10.0000 mL | INTRAVENOUS | Status: DC | PRN
  Administered 2019-10-04: 13:00:00 10 mL
  Filled 2019-10-04: qty 10

## 2019-10-04 MED ORDER — DEXAMETHASONE SODIUM PHOSPHATE 10 MG/ML IJ SOLN
10.0000 mg | Freq: Once | INTRAMUSCULAR | Status: AC
  Administered 2019-10-04: 10 mg via INTRAVENOUS

## 2019-10-04 MED ORDER — DEXAMETHASONE SODIUM PHOSPHATE 10 MG/ML IJ SOLN
INTRAMUSCULAR | Status: AC
  Filled 2019-10-04: qty 1

## 2019-10-04 MED ORDER — SODIUM CHLORIDE 0.9% FLUSH
10.0000 mL | Freq: Once | INTRAVENOUS | Status: AC
  Administered 2019-10-04: 09:00:00 10 mL
  Filled 2019-10-04: qty 10

## 2019-10-04 MED ORDER — SODIUM CHLORIDE 0.9 % IV SOLN
30.0000 mg/m2 | Freq: Once | INTRAVENOUS | Status: AC
  Administered 2019-10-04: 12:00:00 40 mg via INTRAVENOUS
  Filled 2019-10-04: qty 4

## 2019-10-04 MED ORDER — SODIUM CHLORIDE 0.9 % IV SOLN
Freq: Once | INTRAVENOUS | Status: AC
  Filled 2019-10-04: qty 250

## 2019-10-04 MED ORDER — HEPARIN SOD (PORK) LOCK FLUSH 100 UNIT/ML IV SOLN
500.0000 [IU] | Freq: Once | INTRAVENOUS | Status: AC | PRN
  Administered 2019-10-04: 500 [IU]
  Filled 2019-10-04: qty 5

## 2019-10-04 NOTE — Progress Notes (Signed)
Ok to treat with [place] today per MD Burr Medico  Per MD Burr Medico, pt is to start taking 40 MEQ Potassium daily. Pt verbalizes understanding and agrees with plan of care.

## 2019-10-04 NOTE — Patient Instructions (Signed)
Coronavirus (COVID-19) Are you at risk?  Are you at risk for the Coronavirus (COVID-19)?  To be considered HIGH RISK for Coronavirus (COVID-19), you have to meet the following criteria:  . Traveled to [place], [place], [place], [place] or [place]; or in the [place] to [place], [place], [place], or [place]; and have fever, cough, and shortness of breath within the last 2 weeks of travel OR . Been in close contact with a person diagnosed with COVID-19 within the last 2 weeks and have fever, cough, and shortness of breath . IF YOU DO NOT MEET THESE CRITERIA, YOU ARE CONSIDERED LOW RISK FOR COVID-19.  What to do if you are HIGH RISK for COVID-19?  . If you are having a medical emergency, call 911. . Seek medical care right away. Before you go to a doctor's office, urgent care or emergency department, call ahead and tell them about your recent travel, contact with someone diagnosed with COVID-19, and your symptoms. You should receive instructions from your physician's office regarding next steps of care.  . When you arrive at healthcare provider, tell the healthcare staff immediately you have returned from visiting [place], [place], [place], [place] or [place]; or traveled in the [place] to [place], [place], [place], or [place]; in the last two weeks or you have been in close contact with a person diagnosed with COVID-19 in the last 2 weeks.   . Tell the health care staff about your symptoms: fever, cough and shortness of breath. . After you have been seen by a medical provider, you will be either: o Tested for (COVID-19) and discharged home on quarantine except to seek medical care if symptoms worsen, and asked to  - Stay home and avoid contact with others until you get your results (4-5 days)  - Avoid travel on public transportation if possible (such as bus, train, or airplane) or o Sent to the Emergency Department by EMS for evaluation, COVID-19 testing, and possible  admission depending on your condition and test results.  What to do if you are LOW RISK for COVID-19?  Reduce your risk of any infection by using the same precautions used for avoiding the common cold or flu:  . Wash your hands often with soap and warm water for at least 20 seconds.  If soap and water are not readily available, use an alcohol-based hand sanitizer with at least 60% alcohol.  . If coughing or sneezing, cover your mouth and nose by coughing or sneezing into the elbow areas of your shirt or coat, into a tissue or into your sleeve (not your hands). . Avoid shaking hands with others and consider head nods or verbal greetings only. . Avoid touching your eyes, nose, or mouth with unwashed hands.  . Avoid close contact with people who are sick. . Avoid places or events with large numbers of people in one location, like concerts or sporting events. . Carefully consider travel plans you have or are making. . If you are planning any travel outside or inside the [place], visit the CDC's Travelers' Health webpage for the latest health notices. . If you have some symptoms but not all symptoms, continue to monitor at home and seek medical attention if your symptoms worsen. . If you are having a medical emergency, call 911.   ADDITIONAL HEALTHCARE OPTIONS FOR PATIENTS  [facility] / e-Visit: [email or web address]         [facility]: [phone]  Onset   Urgent Care: [facility]: [facility] Discharge Instructions for Patients Receiving Chemotherapy  Today you received the following chemotherapy agents Gemzar and Taxotere   To help prevent nausea and vomiting after your treatment, we encourage you to take your nausea medication as directed.    If you develop nausea and vomiting that is not controlled by your nausea medication, call the clinic.    BELOW ARE SYMPTOMS THAT SHOULD BE REPORTED IMMEDIATELY:  *FEVER GREATER THAN 100.5 F  *CHILLS WITH OR WITHOUT FEVER  NAUSEA AND VOMITING THAT IS NOT CONTROLLED WITH YOUR NAUSEA MEDICATION  *UNUSUAL SHORTNESS OF BREATH  *UNUSUAL BRUISING OR BLEEDING  TENDERNESS IN MOUTH AND THROAT WITH OR WITHOUT PRESENCE OF ULCERS  *URINARY PROBLEMS  *BOWEL PROBLEMS  UNUSUAL RASH Items with * indicate a potential emergency and should be followed up as soon as possible.  Feel free to call the clinic should you have any questions or concerns. The clinic phone number is [phone].  Please show the [place] [facility] at check-in to the Emergency Department and triage nurse.

## 2019-10-05 LAB — CANCER ANTIGEN 19-9: CA 19-9: 73 U/mL — ABNORMAL HIGH (ref 0–35)

## 2019-10-09 ENCOUNTER — Other Ambulatory Visit: Payer: Self-pay

## 2019-10-09 ENCOUNTER — Inpatient Hospital Stay: Payer: BLUE CROSS/BLUE SHIELD

## 2019-10-09 VITALS — BP 108/62 | HR 78 | Temp 98.2°F | Resp 18

## 2019-10-09 DIAGNOSIS — Z95828 Presence of other vascular implants and grafts: Secondary | ICD-10-CM

## 2019-10-09 DIAGNOSIS — C259 Malignant neoplasm of pancreas, unspecified: Secondary | ICD-10-CM | POA: Diagnosis not present

## 2019-10-09 DIAGNOSIS — C787 Secondary malignant neoplasm of liver and intrahepatic bile duct: Secondary | ICD-10-CM

## 2019-10-09 MED ORDER — FILGRASTIM-SNDZ 300 MCG/0.5ML IJ SOSY
300.0000 ug | PREFILLED_SYRINGE | Freq: Once | INTRAMUSCULAR | Status: AC
  Administered 2019-10-09: 300 ug via SUBCUTANEOUS

## 2019-10-09 MED ORDER — FILGRASTIM-SNDZ 300 MCG/0.5ML IJ SOSY
PREFILLED_SYRINGE | INTRAMUSCULAR | Status: AC
  Filled 2019-10-09: qty 0.5

## 2019-10-09 NOTE — Patient Instructions (Signed)

## 2019-10-13 ENCOUNTER — Ambulatory Visit: Payer: BLUE CROSS/BLUE SHIELD

## 2019-10-13 NOTE — Progress Notes (Signed)
[place]   [phone]) [phone] [phone]) [phone]   Clinic Follow up Note   Patient Care Team: [name], MD as PCP - General (Family Medicine) [name], MD as Consulting Physician (Otolaryngology) [name], MD as Consulting Physician (Oncology)  Date of Service:  10/18/2019  CHIEF COMPLAINT: F/u of pancreatic cancer  SUMMARY OF ONCOLOGIC HISTORY: Oncology History Overview Note  Cancer Staging Pancreatic cancer [facility]) Staging form: Exocrine Pancreas, AJCC 8th Edition - Clinical stage from 08/06/2017: Stage IV (cTX, cN0, pM1) - Signed by [name], MD on 08/12/2017     Pancreatic cancer ([place])  07/23/2017 Imaging   US abdomen limited RUQ 07/23/17 IMPRESSION: 1. Cholelithiasis.  No secondary signs of acute cholecystitis. 2. Multiple solid liver masses measuring up to 6.5 cm in the left lobe of the liver, evaluation for metastatic disease is recommended. These results will be called to the ordering clinician or representative by the Radiologist Assistant, and communication documented in the PACS or zVision Dashboard.   07/23/2017 Imaging   CT Abdomen W Contrast 07/23/17 IMPRESSION: 1. Widespread metastatic disease throughout the liver. No clear primary malignancy identified in the abdomen. The pelvis was not imaged. Tissue sampling recommended. 2. Probable adenopathy superior to the pancreatic tail. No evidence of pancreatic mass. 3. Suspected incidental hemangioma inferiorly in the right hepatic lobe. 4. Nonspecific nodularity in the breasts. The patient has undergone recent (03/25/2017 and 04/01/2017) mammography and ultrasound.   07/29/2017 Initial Diagnosis   Metastasis to liver of unknown origin ([place])   07/29/2017 PET scan   PET 07/29/17  IMPRESSION: 1. Numerous bulky liver masses are hypermetabolic compatible with malignancy. 2. Accentuated activity within or along the pancreatic tail, likely represent a primary pancreatic tumor.  Consider pancreatic protocol MRI to further work this up. 3. The peripancreatic lymph node shown above the pancreatic tail is mildly hypermetabolic favoring malignancy. 4.  Prominent stool throughout the colon favors constipation. 5. 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   08/05/2017 Pathology Results   Liver Biopsy  Diagnosis 08/05/17 Liver, needle/core biopsy - CARCINOMA. - SEE COMMENT. Microscopic Comment The malignant cells are positive for cytokeratin 7. They are negative for arginase, CDX2, cytokeratin 20, estrogen receptor, GATA-3, GCDFP, Glypican 3, Hep Par 1, Napsin A, and TTF-1. This immunohistochemical is nonspecific. Possibly primary sources include pancreatobiliary and upper gastrointestinal. Radiologic correlation is necessary. Of note, organ specific markers (GATA-3, GCDFP-breast, TTF-1, Napsin A-lung, and CDX2-colon) are negative. (JBK:ecj 08/09/2017)   08/21/2017 - 02/10/2018 Chemotherapy   FOLFIRINOX every 2 weeks starting 08/21/17. Dose reduced due to neuropahty and cytopenia    10/14/2017 Imaging   CT CAP WO Contrast 10/14/17 IMPRESSION: Evidence of known numerous liver metastases without significant interval change. No other evidence of metastatic disease within the chest, abdomen or pelvis. Cholelithiasis. Tiny pericardial effusion.   12/02/2017 Genetic Testing   Negative for pathogenic mutation.  The genes analyzed were the 83 genes on [facility]'s Multi-Cancer panel (ALK, APC, ATM, AXIN2, BAP1, BARD1, BLM, BMPR1A, BRCA1, BRCA2, BRIP1, CASR, CDC73, CDH1, CDK4, CDKN1B, CDKN1C, CDKN2A, CEBPA, CHEK2, CTNNA1, DICER1, DIS3L2, EGFR, EPCAM, FH, FLCN, GATA2, GPC3, GREM1, HOXB13, HRAS, KIT, MAX, MEN1, MET, MITF, MLH1, MSH2, MSH3, MSH6, MUTYH, NBN, NF1, NF2, NTHL1, PALB2, PDGFRA, PHOX2B, PMS2, POLD1, POLE, POT1, PRKAR1A, PTCH1, PTEN, RAD50, RAD51C, RAD51D, RB1, RECQL4, RET, RUNX1, SDHA, SDHAF2, SDHB, SDHC, SDHD, SMAD4, SMARCA4, SMARCB1, SMARCE1, STK11, SUFU, TERC, TERT,  TMEM127, TP53, TSC1, TSC2, VHL, WRN, WT1).   12/17/2017 PET scan   IMPRESSION: 1. Although the liver metastases  are still visible on the CT images, they are significantly smaller and retain no abnormal metabolic activity, consistent with response to therapy. 2. No abnormal activity within the pancreas. 3. No disease progression identified. 4. Cholelithiasis.   02/21/2018 PET scan   02/21/2018 PET Scan  IMPRESSION: 1. There are 2 new small nodules identified within the right lung which measure up to 5 mm. These are too small to reliably characterize by PET-CT, but warrant close interval follow-up. 2. Again noted are multifocal liver metastasis. The target lesion within the left lobe is slightly decreased in size when compared with the previous exam. Similar to previous exam there is no abnormal hypermetabolism above background liver activity identified within the liver lesions. 3. Gallstones.   02/23/2018 - 09/04/2018 Chemotherapy   5-FU pump infusion and liposomal irinotecan, every 2 weeks.  First cycle dose reduced due to cytopenia in the travel. Stopped on 08/25/2018 due to disease progression.    05/17/2018 Imaging   05/17/2018 PET Scan IMPRESSION: 1. Mixed response. The 2 small right lung nodules have decreased in size in the interval. Single focus of increased uptake within the liver is new from 02/21/2018 and concerning for recurrent metabolically active liver metastasis. 2. Splenomegaly.  New from previous exam. 3. Diffusely increased bone marrow activity, likely reflecting treatment related changes.   09/01/2018 PET scan   PET 09/01/18 IMPRESSION: 1. Unfortunately there recurrence of hepatic metastasis with multiple new hypermetabolic lesions in LEFT and RIGHT hepatic lobe. 2. New extra hepatic site of malignancy which appears associated the mid pancreas. Difficult to define lesion on noncontrast exam. 3. Diffuse marrow activity is favored benign. 4. No evidence of pulmonary  metastasis.   09/12/2018 - 03/27/2019 Chemotherapy   second-line Gemcitabine and Abraxane for 2 weeks on, 1 week off starting 09/12/2018. Due to neuropathy, I will start her with dose reduced Abraxane. Stopped after 03/27/19 due to disease progression.    12/14/2018 Imaging   CT CAP  IMPRESSION: CT demonstrates acute cholecystitis, with choledocholithiasis of the cystic duct.  These results were discussed by telephone at the time of interpretation on 12/14/2018 at 5:05 pm with Dr. Burr Medico.  Redemonstration of known liver metastases, with positive response to therapy, interval reduction in size of all lesions, with multiple demonstrating significant internal necrosis.  No acute finding of the chest.  Ancillary findings as above.   03/21/2019 Imaging   CT CAP W Contrast IMPRESSION: 1. Numerous rim enhancing liver metastases show overall trend towards mild progression although 1 of the index lesions is stable in the interval. 2. Interval development of portocaval lymphadenopathy concerning for disease progression. 3. Slight progression of main duct dilatation in the pancreas with abrupt cut off in the region of the body. 4. Interval development of a 9 mm subpleural nodule at the right base along the posterior hemidiaphragm. Close attention on follow-up recommended as metastatic disease not excluded. 5. New splenomegaly. 6. Cholelithiasis with diffuse gallbladder wall thickening. Gallbladder wall thickening is decreased in the interval. Stones and sludge noted in the gallbladder lumen.   04/09/2019 Imaging   CT AP W Contrast 04/09/19  IMPRESSION: 1. Multiple gallstones in lumen gallbladder without evidence acute cholecystitis. No significant change from prior. No biliary duct dilatation. Common bile duct normal. 2. Widespread hepatic metastasis unchanged from comparison CT. 3. Chronic pancreatic duct dilatation in the body and tail unchanged. 4. No bowel obstruction. 5. Small  amount of intraperitoneal free fluid unchanged.   04/10/2019 -  Chemotherapy   Third-line GTX with Xeloda  $'1000mg'F$  BID day 1-14, off for 1 week with Docetaxel and Gemcitbine on day 4 and day 11 every 3 weeks starting oral chemo on 04/10/19.    07/03/2019 Imaging   CT CAP W Contrast  IMPRESSION: 1. Overall stable diffuse hepatic metastatic disease. No new/progressive findings. 2. Stable to slightly larger periportal lymph node. 3. Stable CT appearance of the pancreas. Abrupt cut off of a dilated main pancreatic duct in the body/head junction region without obvious pancreatic lesion. 4. No pulmonary nodules are identified at the lung bases. 5. Stable mild splenomegaly. 6. No new/acute findings.     10/17/2019 Imaging   CT CAP W Contrast  IMPRESSION: 1. Overall mild progression of metastatic disease. Portacaval lymphadenopathy has increased. Enlarging lesion along the posterior right diaphragm is suspicious for growing right pleural metastasis. 2. Bulky liver metastatic disease is relatively stable with mild mixed changes as detailed. 3. Pancreatic body tumor is stable. 4. Moderate splenomegaly, increased. 5. Aortic Atherosclerosis (ICD10-I70.0). Additional chronic findings as detailed.       Pancreatic cancer metastasized to liver ([place])  08/11/2017 Initial Diagnosis   Pancreatic cancer metastasized to liver [facility])      CURRENT THERAPY:  Third-line GTX with Xeloda'1000mg'$ BID day 1-14, off for 1 week with Docetaxel'30mg'$ /m2and Gemcitabine'750mg'$ /m2on day 4 and day 11 every 3 weeks starting oral chemo on 04/10/19.Starting 07/06/19 Morgan Dawson will reduce Xeloda to '1000mg'$  in the AM and '500mg'$  in the PM due to low blood counts.  INTERVAL HISTORY:  Morgan Dawson is here for a follow up and treatment. Morgan Dawson presents with her husband. Morgan Dawson notes Morgan Dawson is doing well and stable. Morgan Dawson notes Morgan Dawson has been having more diarrhea recently along with dryness in her fingertips. Morgan Dawson notes by third watery BM Morgan Dawson  will use imodium which controls it. This diarrhea does not occur every day, but earlier in cycle and at least 3 times in a cycle.  Morgan Dawson notes from IV chemo Morgan Dawson will feel nauseous for 1-2 days and is resolved with Zofran.   Morgan Dawson notes Morgan Dawson received her first vaccine 3 days ago along with her husband. Morgan Dawson tolerated well with mild arm soreness. Morgan Dawson had questions about what Morgan Dawson is able to do after her vaccinations as Morgan Dawson wants to travel to [facility] 4/17-4/27.    REVIEW OF SYSTEMS:   Constitutional: Denies fevers, chills or abnormal weight loss Eyes: Denies blurriness of vision Ears, nose, mouth, throat, and face: Denies mucositis or sore throat Respiratory: Denies cough, dyspnea or wheezes Cardiovascular: Denies palpitation, chest discomfort or lower extremity swelling Gastrointestinal:  Denies nausea, heartburn (+) Increased diarrhea  Skin: Denies abnormal skin rashes (+) dry skin of hands Lymphatics: Denies new lymphadenopathy or easy bruising Neurological:Denies numbness, tingling or new weaknesses Behavioral/Psych: Mood is stable, no new changes  All other systems were reviewed with the patient and are negative.  MEDICAL HISTORY:  Past Medical History:  Diagnosis Date  . Diarrhea of presumed infectious origin 01/12/2018  . Gastroenteritis 01/12/2018  . Genetic testing 12/02/2017   Multi-Cancer panel (83 genes) @ [facility] - No pathogenic mutations detected  . Meniere disease 2016  . met pancreatic ca to liver dx'd 07/2017  . Pancreatitis     SURGICAL HISTORY: Past Surgical History:  Procedure Laterality Date  . CERVICAL ABLATION    . ENDOMETRIAL ABLATION  2011  . IR CHOLANGIOGRAM EXISTING TUBE  01/09/2019  . IR FLUORO GUIDE PORT INSERTION RIGHT  08/12/2017  . IR PERC CHOLECYSTOSTOMY  12/15/2018  . IR US GUIDE VASC ACCESS  RIGHT  08/12/2017  . [place]    I have reviewed the social history and family history with the patient and they are unchanged from previous  note.  ALLERGIES:  has No Known Allergies.  MEDICATIONS:  Current Outpatient Medications  Medication Sig Dispense Refill  . b complex vitamins tablet Take 1 tablet by mouth daily.    . capecitabine (XELODA) 500 MG tablet TAKE 2 TABLETS BY MOUTH  TWICE DAILY WITHIN 30  MINUTES OF MEALS ON DAYS 1  TO 14 OF EACH 21 DAY CYCLE 56 tablet 2  . Cholecalciferol (VITAMIN D3 GUMMIES ADULT) 25 MCG (1000 UT) CHEW Chew by mouth. Taking 2 gummies    . cyclobenzaprine (FLEXERIL) 5 MG tablet Take 1 tablet (5 mg total) by mouth 3 (three) times daily as needed for muscle spasms. 30 tablet 0  . eltrombopag (PROMACTA) 50 MG tablet Take 1 tablet (50 mg total) by mouth daily. Take on an empty stomach 1 hour before a meal or 2 hours after 30 tablet 2  . gabapentin (NEURONTIN) 100 MG capsule Take 1 capsule (100 mg total) by mouth 3 (three) times daily. 270 capsule 2  . lidocaine-prilocaine (EMLA) cream Apply 1 application topically as needed. 30 g 2  . loperamide (IMODIUM) 2 MG capsule Take 1 capsule (2 mg total) by mouth as needed for diarrhea or loose stools. 30 capsule 0  . loratadine (CLARITIN) 10 MG tablet Take 10 mg by mouth daily as needed for allergies.    . magic mouthwash w/lidocaine SOLN Take 10 mLs by mouth 3 (three) times daily as needed for mouth pain. Swish and spit 10 ML by mouth 3 times daily as needed for mouth pain. 240 mL 0  . meclizine (ANTIVERT) 25 MG tablet Take 25 mg by mouth 3 (three) times daily as needed for dizziness.    . ondansetron (ZOFRAN-ODT) 8 MG disintegrating tablet TAKE 1 TABLET BY MOUTH EVERY 8 HOURS AS NEEDED FOR NAUSE OR VOMITING 40 tablet 0  . potassium chloride (KLOR-CON) 10 MEQ tablet Take 1 tablet (10 mEq total) by mouth 4 (four) times daily. 360 tablet 2  . prochlorperazine (COMPAZINE) 10 MG tablet Take 1 tablet (10 mg total) by mouth every 6 (six) hours as needed for nausea or vomiting. 40 tablet 2  . Rivaroxaban (XARELTO) 15 MG TABS tablet TAKE 1 TABLET(15 MG) BY MOUTH  DAILY 90 tablet 1  . traMADol (ULTRAM) 50 MG tablet Take 1 tablet (50 mg total) by mouth every 6 (six) hours as needed for moderate pain or severe pain. 30 tablet 0   No current facility-administered medications for this visit.   Facility-Administered Medications Ordered in Other Visits  Medication Dose Route Frequency Provider Last Rate Last Admin  . DOCEtaxel (TAXOTERE) 40 mg in sodium chloride 0.9 % 150 mL chemo infusion  30 mg/m2 (Treatment Plan Recorded) Intravenous Once [name], MD      . gemcitabine (GEMZAR) 912 mg in sodium chloride 0.9 % 250 mL chemo infusion  600 mg/m2 (Treatment Plan Recorded) Intravenous Once [name], MD      . heparin lock flush 100 unit/mL  500 Units Intracatheter Once PRN [name], MD      . sodium chloride flush (NS) 0.9 % injection 10 mL  10 mL Intracatheter Once [name], MD      . sodium chloride flush (NS) 0.9 % injection 10 mL  10 mL Intracatheter PRN [name], MD  PHYSICAL EXAMINATION: ECOG PERFORMANCE STATUS: 1 - Symptomatic but completely ambulatory  Vitals:   10/18/19 0844  BP: 100/66  Pulse: 87  Resp: 18  Temp: 98.5 F (36.9 C)  SpO2: 100%   Filed Weights   10/18/19 0844  Weight: 107 lb 8 oz (48.8 kg)    Due to COVID19 we will limit examination to appearance. Patient had no complaints.  GENERAL:alert, no distress and comfortable SKIN: skin color normal, no rashes or significant lesions EYES: normal, Conjunctiva are pink and non-injected, sclera clear  NEURO: alert & oriented x 3 with fluent speech   LABORATORY DATA:  I have reviewed the data as listed CBC Latest Ref Rng & Units 10/17/2019 10/04/2019 09/27/2019  WBC 4.0 - 10.5 K/uL 4.2 2.0(L) 4.7  Hemoglobin 12.0 - 15.0 g/dL 9.1(L) 8.5(L) 8.9(L)  Hematocrit 36.0 - 46.0 % 28.2(L) 26.4(L) 28.0(L)  Platelets 150 - 400 K/uL 248 196 287     CMP Latest Ref Rng & Units 10/17/2019 10/04/2019 09/27/2019  Glucose 70 - 99 mg/dL 101(H) 132(H) 113(H)  BUN 6 - 20 mg/dL '13 17 15   '$ Creatinine 0.44 - 1.00 mg/dL 1.00 1.00 1.00  Sodium 135 - 145 mmol/L 140 143 139  Potassium 3.5 - 5.1 mmol/L 3.5 3.4(L) 3.8  Chloride 98 - 111 mmol/L 103 104 103  CO2 22 - 32 mmol/L '26 26 28  '$ Calcium 8.9 - 10.3 mg/dL 9.0 9.5 8.9  Total Protein 6.5 - 8.1 g/dL 7.1 7.2 7.1  Total Bilirubin 0.3 - 1.2 mg/dL 0.8 0.6 0.9  Alkaline Phos 38 - 126 U/L 161(H) 196(H) 187(H)  AST 15 - 41 U/L 40 39 47(H)  ALT 0 - 44 U/L 24 28 62(H)      RADIOGRAPHIC STUDIES: I have personally reviewed the radiological images as listed and agreed with the findings in the report. CT Chest W Contrast  Result Date: 10/17/2019 CLINICAL DATA:  Pancreatic cancer with liver metastases, with ongoing chemotherapy. Restaging. EXAM: CT CHEST, ABDOMEN, AND PELVIS WITH CONTRAST TECHNIQUE: Multidetector CT imaging of the chest, abdomen and pelvis was performed following the standard protocol during bolus administration of intravenous contrast. CONTRAST:  138m OMNIPAQUE IOHEXOL 300 MG/ML  SOLN COMPARISON:  07/03/2019 CT chest, abdomen and pelvis. FINDINGS: CT CHEST FINDINGS Cardiovascular: Normal heart size. No significant pericardial effusion/thickening. Right internal jugular Port-A-Cath terminates at the cavoatrial junction. Great vessels are normal in course and caliber. No central pulmonary emboli. Mediastinum/Nodes: No discrete thyroid nodules. Unremarkable esophagus. No pathologically enlarged axillary, mediastinal or hilar lymph nodes. Lungs/Pleura: No pneumothorax. No pleural effusion. Enlarging 1.7 x 0.8 cm lesion along the posterior right hemidiaphragm (series 4/image 10), previously 0.7 x 0.4 cm on 07/03/2019 CT, suspicious for pleural lesion. Otherwise no acute consolidative airspace disease, lung masses or significant pulmonary nodules. Musculoskeletal: No aggressive appearing focal osseous lesions. Mild thoracic spondylosis. CT ABDOMEN PELVIS FINDINGS Hepatobiliary: Liver mildly enlarged by innumerable (greater than 15)  heterogeneously avidly enhancing liver masses scattered throughout the liver, overall relatively stable with mild mixed changes as detailed below. Representative liver masses as follows: -segment 6 right liver lobe 5.0 x 4.3 cm mass (series 7/image 82), previously 5.0 x 4.1 cm using similar measurement technique, not appreciably changed -segment 4A left liver lobe 6.4 x 5.0 cm mass (series 7/image 57), previously 6.9 x 5.2 cm using similar measurement technique, slightly decreased -right liver dome 4.4 x 4.1 cm mass (series 7/image 47), previously 4.1 x 4.1 cm using similar measurement technique, slightly increased -left liver 2.6 x 2.6  cm lesion along the intersegmental fissure (series 7/image 65), previously 2.7 x 2.5 cm using similar measurement technique, not appreciably changed Cholelithiasis. Contracted gallbladder. Chronic mild diffuse gallbladder wall thickening. No acute pericholecystic fluid. No biliary ductal dilatation. Pancreas: Ill-defined pancreatic body mass measures approximately 2.8 x 1.8 cm (series 7/image 69) with stable marked pancreatic duct dilation and pancreatic atrophy in distal pancreatic body and tail, previously approximately 2.8 x 1.7 cm, not appreciably changed. Spleen: Moderate splenomegaly. Craniocaudal splenic length 15.4 cm, previously 13.9 cm, mildly increased. No splenic mass. Adrenals/Urinary Tract: Normal adrenals. Normal kidneys with no hydronephrosis and no renal mass. Normal nondistended bladder. Stomach/Bowel: Normal non-distended stomach. Normal caliber small bowel with no small bowel wall thickening. Normal appendix. Oral contrast transits to the colon. Normal large bowel with no diverticulosis, large bowel wall thickening or pericolonic fat stranding. Vascular/Lymphatic: Mildly atherosclerotic nonaneurysmal abdominal aorta. Patent portal, splenic, hepatic and renal veins. Enlarged 2.4 cm portacaval node (series 4/image 26), increased from 1.9 cm. No additional  pathologically enlarged lymph nodes in the abdomen or pelvis. Reproductive: Stable small fundal 1.3 cm uterine fibroid. No adnexal masses. Other: No pneumoperitoneum, ascites or focal fluid collection. Musculoskeletal: No aggressive appearing focal osseous lesions. IMPRESSION: 1. Overall mild progression of metastatic disease. Portacaval lymphadenopathy has increased. Enlarging lesion along the posterior right diaphragm is suspicious for growing right pleural metastasis. 2. Bulky liver metastatic disease is relatively stable with mild mixed changes as detailed. 3. Pancreatic body tumor is stable. 4. Moderate splenomegaly, increased. 5. Aortic Atherosclerosis (ICD10-I70.0). Additional chronic findings as detailed. Electronically Signed   By: Ilona Sorrel M.D.   On: 10/17/2019 16:10   CT Abdomen Pelvis W Contrast  Result Date: 10/17/2019 CLINICAL DATA:  Pancreatic cancer with liver metastases, with ongoing chemotherapy. Restaging. EXAM: CT CHEST, ABDOMEN, AND PELVIS WITH CONTRAST TECHNIQUE: Multidetector CT imaging of the chest, abdomen and pelvis was performed following the standard protocol during bolus administration of intravenous contrast. CONTRAST:  129m OMNIPAQUE IOHEXOL 300 MG/ML  SOLN COMPARISON:  07/03/2019 CT chest, abdomen and pelvis. FINDINGS: CT CHEST FINDINGS Cardiovascular: Normal heart size. No significant pericardial effusion/thickening. Right internal jugular Port-A-Cath terminates at the cavoatrial junction. Great vessels are normal in course and caliber. No central pulmonary emboli. Mediastinum/Nodes: No discrete thyroid nodules. Unremarkable esophagus. No pathologically enlarged axillary, mediastinal or hilar lymph nodes. Lungs/Pleura: No pneumothorax. No pleural effusion. Enlarging 1.7 x 0.8 cm lesion along the posterior right hemidiaphragm (series 4/image 10), previously 0.7 x 0.4 cm on 07/03/2019 CT, suspicious for pleural lesion. Otherwise no acute consolidative airspace disease, lung  masses or significant pulmonary nodules. Musculoskeletal: No aggressive appearing focal osseous lesions. Mild thoracic spondylosis. CT ABDOMEN PELVIS FINDINGS Hepatobiliary: Liver mildly enlarged by innumerable (greater than 15) heterogeneously avidly enhancing liver masses scattered throughout the liver, overall relatively stable with mild mixed changes as detailed below. Representative liver masses as follows: -segment 6 right liver lobe 5.0 x 4.3 cm mass (series 7/image 82), previously 5.0 x 4.1 cm using similar measurement technique, not appreciably changed -segment 4A left liver lobe 6.4 x 5.0 cm mass (series 7/image 57), previously 6.9 x 5.2 cm using similar measurement technique, slightly decreased -right liver dome 4.4 x 4.1 cm mass (series 7/image 47), previously 4.1 x 4.1 cm using similar measurement technique, slightly increased -left liver 2.6 x 2.6 cm lesion along the intersegmental fissure (series 7/image 65), previously 2.7 x 2.5 cm using similar measurement technique, not appreciably changed Cholelithiasis. Contracted gallbladder. Chronic mild diffuse gallbladder wall thickening. No acute pericholecystic  fluid. No biliary ductal dilatation. Pancreas: Ill-defined pancreatic body mass measures approximately 2.8 x 1.8 cm (series 7/image 69) with stable marked pancreatic duct dilation and pancreatic atrophy in distal pancreatic body and tail, previously approximately 2.8 x 1.7 cm, not appreciably changed. Spleen: Moderate splenomegaly. Craniocaudal splenic length 15.4 cm, previously 13.9 cm, mildly increased. No splenic mass. Adrenals/Urinary Tract: Normal adrenals. Normal kidneys with no hydronephrosis and no renal mass. Normal nondistended bladder. Stomach/Bowel: Normal non-distended stomach. Normal caliber small bowel with no small bowel wall thickening. Normal appendix. Oral contrast transits to the colon. Normal large bowel with no diverticulosis, large bowel wall thickening or pericolonic fat  stranding. Vascular/Lymphatic: Mildly atherosclerotic nonaneurysmal abdominal aorta. Patent portal, splenic, hepatic and renal veins. Enlarged 2.4 cm portacaval node (series 4/image 26), increased from 1.9 cm. No additional pathologically enlarged lymph nodes in the abdomen or pelvis. Reproductive: Stable small fundal 1.3 cm uterine fibroid. No adnexal masses. Other: No pneumoperitoneum, ascites or focal fluid collection. Musculoskeletal: No aggressive appearing focal osseous lesions. IMPRESSION: 1. Overall mild progression of metastatic disease. Portacaval lymphadenopathy has increased. Enlarging lesion along the posterior right diaphragm is suspicious for growing right pleural metastasis. 2. Bulky liver metastatic disease is relatively stable with mild mixed changes as detailed. 3. Pancreatic body tumor is stable. 4. Moderate splenomegaly, increased. 5. Aortic Atherosclerosis (ICD10-I70.0). Additional chronic findings as detailed. Electronically Signed   By: Ilona Sorrel M.D.   On: 10/17/2019 16:10     ASSESSMENT & PLAN:  LILLY GASSER is a 59 y.o. female with   1. 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 Morgan Dawson progressed on this based on 08/2018 PET scan. -Morgan Dawson mildly progressed onsecond-line Gemcitabine and Abraxanein liver and new lung nodule.  -Morgan Dawson is currently onthird-line GTX with Xeloda '1000mg'$  BID 2 weeks on/1 week off andDocetaxelandGemcitabineon day 4 and 11 q3weeks starting 04/10/19.Granix has been added to week 3 due toneutropenia. Starting 07/03/19 Xeloda has been decreased to'1000mg'$  in the AM and '500mg'$  in the [place] -I personally reviewed and discussed her CT CAP from 10/17/19 with pt and her husband today which shows her bulky liver mets and pancreatic body tumor are relatively stable. Her  portacaval lymphadenopathy has increased  Morgan Dawson has overall mild progression of metastatic disease. Her last Ca 19.9 continues to fluctuate, but overall stable.  -S/p C9 Morgan Dawson has increased but manageable diarrhea and more dry skin. Given Morgan Dawson is overall tolerating well Morgan Dawson will complete C10 and then we will discuss treatment change and options.  -Her foundation 1 genomic testing showed no targetable mutation -Unfortunately Morgan Dawson is running out of standard treatment options.  I discussed starting to look into clinical trail options available to her for next line treatment, likely early phase. Morgan Dawson is interested and open to [place], [place] area as well.  -Labs reviewed from this week and adequate to proceed with C10 day 4 Docetaxel/Gemcitabine. Continue Xeloda '1000mg'$  in the AM and '500mg'$  in the PM. -Phone visit in 2 weeks  -Morgan Dawson has received her first COVID19 vaccine. Will proceed with second dose in 3 weeks. I encouraged her to continue COVID precautions but Morgan Dawson is fine to travel if Morgan Dawson wants to. I answered all her questions to her understanding and satisfactions.    2. Abdominal pain -Continue tramadol PRN for pain, has not needed much recently, no fever recently   3. Malnutrition -Due to chemo  and underlying metastatic cancer. -Continue nutritional supplement and f/u with dietician. -Appetite and eating has been fair but Morgan Dawson has been able to maintain weight well. Stable.   4. Transaminitis -Due to underlying chemo and malignancy  5. Pancytopenia, secondary to chemo -Required chemo dose reductions. -S/p blood transfusion as needed withHg<8. Last on 09/20/18 -OnPromacta, willcontinue. Due to worsening thrombocytopenia from chemo, increasedto '50mg'$  daily starting 05/31/19. -Granix has been added tothirdweek and Xeloda dose reduced starting 07/03/19  -Morgan Dawson is responding well to Granix. Her WBC and PLT are normal, Hg improved to 9.1 today (10/18/19)  6. Hypokalemia, CKD Stage III -Sheis  currently on KCL 2mq TID -Has been stable, K normal today.   7.Peripheral neuropathy, grade 2  -Currently on Neurontin 200 mg at night and Bcomplex.Morgan Dawson used ice bags during infusion. -Mild and stable in hands and has mildly progressed in feet with current chemo. Will continue to monitor. Stable.   8.H/o Acute cholecystitis, status post cholecystectomy tube placement 4/23/20per IR -Morgan Dawson was seen by surgery during herhospitalization, surgery was not recommended due to high risk of complication, and concerns for cancer progression when holding chemotherapy for surgery -Drain intact,currently has decreased to 268mslightlybloody drainage per day -afebrileand no chills. -Her CT CAP from 12/14/18 shows Morgan Dawson has numerus gallstones. Will monitor for any inflammation. -Imaging in 12/2018 consistent with cholecystitis with cystic duct obstruction. No leak on HIDA.  -Morgan Dawson was previously given prophylactic 5-day course levaquin on 04/13/19 due to fever spikes during chemo -noevidence of recurrent infection recently.   9. LE DVT -Diagnosed on December 15, 2018, when Morgan Dawson was hospitalized for cholecystitis -Due to her bloody biliary drainage, and moderate thrombocytopenia from chemotherapy, Morgan Dawson is on low-doseXarelto'15mg'$  daily (no loading dose), Morgan Dawson is tolerating well and will continue  10.Goal of care discussion -Morgan Dawson is full code now -Morgan Dawson understands her cancer is incurable at this stage, and the goal of therapy is palliative, to prolong his life, and prevent cancer related symptoms  11. Diarrhea  -With recent cycles and current cycle her diarrhea has increased and occurs earlier. Morgan Dawson has been using imodium which helps. I recommend Morgan Dawson use 2 tabs of imodium at first use and 2 more as needed.  -So for manageable.    Plan -CT scan reviewed and shows overall mild disease progression.  -I refilled Zofran today  -Labs reviewed and adequate to proceed with C10D4  Gemcitabine/Docetaxeltoday, D11 chemo next week -Granix on day 15  -Continue Xeloda '1000mg'$  in the AM and '500mg'$  in the PM.  -Phone visit in 2 weeks to review the clinical trial options, I will start looking for her in [place] and [place]  -Lab, flush, Chemo Gemcitabine/Docetaxelin 3 and 4 weeks    No problem-specific Assessment & Plan notes found for this encounter.   No orders of the defined types were placed in this encounter.  All questions were answered. The patient knows to call the clinic with any problems, questions or concerns. No barriers to learning was detected. The total time spent in the appointment was 45 minutes.     YaTruitt MerleMD 10/18/2019   I, AmJoslyn Devonam acting as scribe for YaTruitt MerleMD.   I have reviewed the above documentation for accuracy and completeness, and I agree with the above.

## 2019-10-15 ENCOUNTER — Ambulatory Visit: Payer: BLUE CROSS/BLUE SHIELD | Attending: Internal Medicine

## 2019-10-15 DIAGNOSIS — Z23 Encounter for immunization: Secondary | ICD-10-CM | POA: Insufficient documentation

## 2019-10-15 NOTE — Progress Notes (Signed)
   Covid-19 Vaccination Clinic  Name:  Morgan Dawson    MRN: [id number] DOB: May 02, 1961  10/15/2019  Morgan Dawson was observed post Covid-19 immunization for 15 minutes without incidence. She was provided with Vaccine Information Sheet and instruction to access the V-Safe system.   Morgan Dawson was instructed to call 911 with any severe reactions post vaccine: Marland Kitchen Difficulty breathing  . Swelling of your face and throat  . A fast heartbeat  . A bad rash all over your body  . Dizziness and weakness    Immunizations Administered    Name Date Dose VIS Date Route   Pfizer COVID-19 Vaccine 10/15/2019 11:04 AM 0.3 mL 08/04/2019 Intramuscular   Manufacturer: [place]   Lot: [id number]   [place]: [id number]

## 2019-10-17 ENCOUNTER — Inpatient Hospital Stay: Payer: BLUE CROSS/BLUE SHIELD

## 2019-10-17 ENCOUNTER — Other Ambulatory Visit: Payer: Self-pay

## 2019-10-17 ENCOUNTER — Ambulatory Visit (HOSPITAL_COMMUNITY)
Admission: RE | Admit: 2019-10-17 | Discharge: 2019-10-17 | Disposition: A | Discharge status: Home / Self Care | Payer: BLUE CROSS/BLUE SHIELD | Source: Ambulatory Visit | Attending: Hematology | Admitting: Hematology

## 2019-10-17 DIAGNOSIS — C251 Malignant neoplasm of body of pancreas: Secondary | ICD-10-CM

## 2019-10-17 DIAGNOSIS — C259 Malignant neoplasm of pancreas, unspecified: Secondary | ICD-10-CM

## 2019-10-17 DIAGNOSIS — Z95828 Presence of other vascular implants and grafts: Secondary | ICD-10-CM

## 2019-10-17 LAB — CBC WITH DIFFERENTIAL/PLATELET
Abs Immature Granulocytes: 0.1 10*3/uL — ABNORMAL HIGH (ref 0.00–0.07)
Basophils Absolute: 0 10*3/uL (ref 0.0–0.1)
Basophils Relative: 1 %
Eosinophils Absolute: 0.1 10*3/uL (ref 0.0–0.5)
Eosinophils Relative: 1 %
HCT: 28.2 % — ABNORMAL LOW (ref 36.0–46.0)
Hemoglobin: 9.1 g/dL — ABNORMAL LOW (ref 12.0–15.0)
Immature Granulocytes: 2 %
Lymphocytes Relative: 7 %
Lymphs Abs: 0.3 10*3/uL — ABNORMAL LOW (ref 0.7–4.0)
MCH: 34.7 pg — ABNORMAL HIGH (ref 26.0–34.0)
MCHC: 32.3 g/dL (ref 30.0–36.0)
MCV: 107.6 fL — ABNORMAL HIGH (ref 80.0–100.0)
Monocytes Absolute: 0.7 10*3/uL (ref 0.1–1.0)
Monocytes Relative: 17 %
Neutro Abs: 3 10*3/uL (ref 1.7–7.7)
Neutrophils Relative %: 72 %
Platelets: 248 10*3/uL (ref 150–400)
RBC: 2.62 MIL/uL — ABNORMAL LOW (ref 3.87–5.11)
RDW: 18.1 % — ABNORMAL HIGH (ref 11.5–15.5)
WBC: 4.2 10*3/uL (ref 4.0–10.5)
nRBC: 0 % (ref 0.0–0.2)

## 2019-10-17 LAB — COMPREHENSIVE METABOLIC PANEL
ALT: 24 U/L (ref 0–44)
AST: 40 U/L (ref 15–41)
Albumin: 3.8 g/dL (ref 3.5–5.0)
Alkaline Phosphatase: 161 U/L — ABNORMAL HIGH (ref 38–126)
Anion gap: 11 (ref 5–15)
BUN: 13 mg/dL (ref 6–20)
CO2: 26 mmol/L (ref 22–32)
Calcium: 9 mg/dL (ref 8.9–10.3)
Chloride: 103 mmol/L (ref 98–111)
Creatinine, Ser: 1 mg/dL (ref 0.44–1.00)
GFR calc Af Amer: >60 mL/min (ref >60)
GFR calc non Af Amer: >60 mL/min (ref >60)
Glucose, Bld: 101 mg/dL — ABNORMAL HIGH (ref 70–99)
Potassium: 3.5 mmol/L (ref 3.5–5.1)
Sodium: 140 mmol/L (ref 135–145)
Total Bilirubin: 0.8 mg/dL (ref 0.3–1.2)
Total Protein: 7.1 g/dL (ref 6.5–8.1)

## 2019-10-17 MED ORDER — IOHEXOL 300 MG/ML  SOLN
100.0000 mL | Freq: Once | INTRAMUSCULAR | Status: AC | PRN
  Administered 2019-10-17: 11:00:00 100 mL via INTRAVENOUS

## 2019-10-17 MED ORDER — SODIUM CHLORIDE 0.9% FLUSH
10.0000 mL | Freq: Once | INTRAVENOUS | Status: AC
  Administered 2019-10-17: 10 mL
  Filled 2019-10-17: qty 10

## 2019-10-17 MED ORDER — SODIUM CHLORIDE (PF) 0.9 % IJ SOLN
INTRAMUSCULAR | Status: AC
  Filled 2019-10-17: qty 50

## 2019-10-17 MED ORDER — HEPARIN SOD (PORK) LOCK FLUSH 100 UNIT/ML IV SOLN
INTRAVENOUS | Status: AC
  Administered 2019-10-17: 500 [IU] via INTRAVENOUS
  Filled 2019-10-17: qty 5

## 2019-10-17 MED ORDER — HEPARIN SOD (PORK) LOCK FLUSH 100 UNIT/ML IV SOLN: 500.0000 [IU] | Freq: Once | INTRAVENOUS | Status: AC

## 2019-10-18 ENCOUNTER — Other Ambulatory Visit: Payer: Self-pay | Admitting: Hematology

## 2019-10-18 ENCOUNTER — Ambulatory Visit: Payer: BLUE CROSS/BLUE SHIELD

## 2019-10-18 ENCOUNTER — Inpatient Hospital Stay (HOSPITAL_BASED_OUTPATIENT_CLINIC_OR_DEPARTMENT_OTHER): Payer: BLUE CROSS/BLUE SHIELD | Admitting: Hematology

## 2019-10-18 ENCOUNTER — Encounter: Payer: Self-pay | Admitting: Hematology

## 2019-10-18 ENCOUNTER — Inpatient Hospital Stay: Payer: BLUE CROSS/BLUE SHIELD

## 2019-10-18 ENCOUNTER — Other Ambulatory Visit: Payer: Self-pay

## 2019-10-18 VITALS — BP 100/66 | HR 87 | Temp 98.5°F | Resp 18 | Ht 65.0 in | Wt 107.5 lb

## 2019-10-18 DIAGNOSIS — Z7189 Other specified counseling: Secondary | ICD-10-CM

## 2019-10-18 DIAGNOSIS — C259 Malignant neoplasm of pancreas, unspecified: Secondary | ICD-10-CM | POA: Diagnosis not present

## 2019-10-18 DIAGNOSIS — C251 Malignant neoplasm of body of pancreas: Secondary | ICD-10-CM

## 2019-10-18 DIAGNOSIS — C787 Secondary malignant neoplasm of liver and intrahepatic bile duct: Secondary | ICD-10-CM

## 2019-10-18 MED ORDER — ONDANSETRON 8 MG PO TBDP: ORAL_TABLET | ORAL | 1 refills | Status: AC

## 2019-10-18 MED ORDER — DEXAMETHASONE SODIUM PHOSPHATE 10 MG/ML IJ SOLN
10.0000 mg | Freq: Once | INTRAMUSCULAR | Status: AC
  Administered 2019-10-18: 10:00:00 10 mg via INTRAVENOUS

## 2019-10-18 MED ORDER — SODIUM CHLORIDE 0.9 % IV SOLN
Freq: Once | INTRAVENOUS | Status: AC
  Filled 2019-10-18: qty 250

## 2019-10-18 MED ORDER — SODIUM CHLORIDE 0.9% FLUSH
10.0000 mL | INTRAVENOUS | Status: DC | PRN
  Administered 2019-10-18: 13:00:00 10 mL
  Filled 2019-10-18: qty 10

## 2019-10-18 MED ORDER — DEXAMETHASONE SODIUM PHOSPHATE 10 MG/ML IJ SOLN
INTRAMUSCULAR | Status: AC
  Filled 2019-10-18: qty 1

## 2019-10-18 MED ORDER — HEPARIN SOD (PORK) LOCK FLUSH 100 UNIT/ML IV SOLN
500.0000 [IU] | Freq: Once | INTRAVENOUS | Status: AC | PRN
  Administered 2019-10-18: 13:00:00 500 [IU]
  Filled 2019-10-18: qty 5

## 2019-10-18 MED ORDER — SODIUM CHLORIDE 0.9 % IV SOLN
600.0000 mg/m2 | Freq: Once | INTRAVENOUS | Status: AC
  Administered 2019-10-18: 11:00:00 912 mg via INTRAVENOUS
  Filled 2019-10-18: qty 23.99

## 2019-10-18 MED ORDER — SODIUM CHLORIDE 0.9 % IV SOLN
30.0000 mg/m2 | Freq: Once | INTRAVENOUS | Status: AC
  Administered 2019-10-18: 40 mg via INTRAVENOUS
  Filled 2019-10-18: qty 4

## 2019-10-18 NOTE — Patient Instructions (Signed)
[facility] Discharge Instructions for Patients Receiving Chemotherapy  Today you received the following chemotherapy agents: gemcitabine and paclitaxel.   To help prevent nausea and vomiting after your treatment, we encourage you to take your nausea medication as directed.   If you develop nausea and vomiting that is not controlled by your nausea medication, call the clinic.   BELOW ARE SYMPTOMS THAT SHOULD BE REPORTED IMMEDIATELY:  *FEVER GREATER THAN 100.5 F  *CHILLS WITH OR WITHOUT FEVER  NAUSEA AND VOMITING THAT IS NOT CONTROLLED WITH YOUR NAUSEA MEDICATION  *UNUSUAL SHORTNESS OF BREATH  *UNUSUAL BRUISING OR BLEEDING  TENDERNESS IN MOUTH AND THROAT WITH OR WITHOUT PRESENCE OF ULCERS  *URINARY PROBLEMS  *BOWEL PROBLEMS  UNUSUAL RASH Items with * indicate a potential emergency and should be followed up as soon as possible.  Feel free to call the clinic should you have any questions or concerns. The clinic phone number is [phone].  Please show the [facility] at check-in to the Emergency Department and triage nurse.

## 2019-10-19 ENCOUNTER — Telehealth: Payer: Self-pay | Admitting: Nurse Practitioner

## 2019-10-19 NOTE — Telephone Encounter (Signed)
Scheduled appt per 2/24 los.  Spoke with pt and she is aware of the appt date and time.

## 2019-10-25 ENCOUNTER — Inpatient Hospital Stay: Payer: BLUE CROSS/BLUE SHIELD

## 2019-10-25 ENCOUNTER — Inpatient Hospital Stay: Payer: BLUE CROSS/BLUE SHIELD | Attending: Hematology

## 2019-10-25 ENCOUNTER — Other Ambulatory Visit: Payer: Self-pay

## 2019-10-25 VITALS — BP 100/70 | HR 82 | Temp 97.8°F | Resp 16

## 2019-10-25 DIAGNOSIS — Z5111 Encounter for antineoplastic chemotherapy: Secondary | ICD-10-CM | POA: Diagnosis present

## 2019-10-25 DIAGNOSIS — C259 Malignant neoplasm of pancreas, unspecified: Secondary | ICD-10-CM | POA: Insufficient documentation

## 2019-10-25 DIAGNOSIS — Z7189 Other specified counseling: Secondary | ICD-10-CM

## 2019-10-25 DIAGNOSIS — C787 Secondary malignant neoplasm of liver and intrahepatic bile duct: Secondary | ICD-10-CM | POA: Insufficient documentation

## 2019-10-25 DIAGNOSIS — Z95828 Presence of other vascular implants and grafts: Secondary | ICD-10-CM

## 2019-10-25 DIAGNOSIS — Z5189 Encounter for other specified aftercare: Secondary | ICD-10-CM | POA: Diagnosis not present

## 2019-10-25 DIAGNOSIS — C251 Malignant neoplasm of body of pancreas: Secondary | ICD-10-CM

## 2019-10-25 LAB — COMPREHENSIVE METABOLIC PANEL
ALT: 32 U/L (ref 0–44)
AST: 46 U/L — ABNORMAL HIGH (ref 15–41)
Albumin: 3.7 g/dL (ref 3.5–5.0)
Alkaline Phosphatase: 183 U/L — ABNORMAL HIGH (ref 38–126)
Anion gap: 10 (ref 5–15)
BUN: 15 mg/dL (ref 6–20)
CO2: 27 mmol/L (ref 22–32)
Calcium: 9.2 mg/dL (ref 8.9–10.3)
Chloride: 104 mmol/L (ref 98–111)
Creatinine, Ser: 0.93 mg/dL (ref 0.44–1.00)
GFR calc Af Amer: >60 mL/min (ref >60)
GFR calc non Af Amer: >60 mL/min (ref >60)
Glucose, Bld: 121 mg/dL — ABNORMAL HIGH (ref 70–99)
Potassium: 3.7 mmol/L (ref 3.5–5.1)
Sodium: 141 mmol/L (ref 135–145)
Total Bilirubin: 0.7 mg/dL (ref 0.3–1.2)
Total Protein: 7.1 g/dL (ref 6.5–8.1)

## 2019-10-25 LAB — CBC WITH DIFFERENTIAL/PLATELET
Abs Immature Granulocytes: 0.01 10*3/uL (ref 0.00–0.07)
Basophils Absolute: 0 10*3/uL (ref 0.0–0.1)
Basophils Relative: 1 %
Eosinophils Absolute: 0 10*3/uL (ref 0.0–0.5)
Eosinophils Relative: 1 %
HCT: 26.9 % — ABNORMAL LOW (ref 36.0–46.0)
Hemoglobin: 8.6 g/dL — ABNORMAL LOW (ref 12.0–15.0)
Immature Granulocytes: 1 %
Lymphocytes Relative: 13 %
Lymphs Abs: 0.2 10*3/uL — ABNORMAL LOW (ref 0.7–4.0)
MCH: 34.4 pg — ABNORMAL HIGH (ref 26.0–34.0)
MCHC: 32 g/dL (ref 30.0–36.0)
MCV: 107.6 fL — ABNORMAL HIGH (ref 80.0–100.0)
Monocytes Absolute: 0.3 10*3/uL (ref 0.1–1.0)
Monocytes Relative: 18 %
Neutro Abs: 1 10*3/uL — ABNORMAL LOW (ref 1.7–7.7)
Neutrophils Relative %: 66 %
Platelets: 192 10*3/uL (ref 150–400)
RBC: 2.5 MIL/uL — ABNORMAL LOW (ref 3.87–5.11)
RDW: 17.2 % — ABNORMAL HIGH (ref 11.5–15.5)
WBC: 1.5 10*3/uL — ABNORMAL LOW (ref 4.0–10.5)
nRBC: 0 % (ref 0.0–0.2)

## 2019-10-25 MED ORDER — DEXAMETHASONE SODIUM PHOSPHATE 10 MG/ML IJ SOLN
INTRAMUSCULAR | Status: AC
  Filled 2019-10-25: qty 1

## 2019-10-25 MED ORDER — HEPARIN SOD (PORK) LOCK FLUSH 100 UNIT/ML IV SOLN
500.0000 [IU] | Freq: Once | INTRAVENOUS | Status: AC | PRN
  Administered 2019-10-25: 500 [IU]
  Filled 2019-10-25: qty 5

## 2019-10-25 MED ORDER — SODIUM CHLORIDE 0.9 % IV SOLN
600.0000 mg/m2 | Freq: Once | INTRAVENOUS | Status: AC
  Administered 2019-10-25: 912 mg via INTRAVENOUS
  Filled 2019-10-25: qty 23.99

## 2019-10-25 MED ORDER — SODIUM CHLORIDE 0.9% FLUSH
10.0000 mL | INTRAVENOUS | Status: DC | PRN
  Administered 2019-10-25: 10 mL
  Filled 2019-10-25: qty 10

## 2019-10-25 MED ORDER — CAPECITABINE 500 MG PO TABS: ORAL_TABLET | ORAL | 0 refills | Status: DC

## 2019-10-25 MED ORDER — SODIUM CHLORIDE 0.9% FLUSH
10.0000 mL | Freq: Once | INTRAVENOUS | Status: AC
  Administered 2019-10-25: 10 mL
  Filled 2019-10-25: qty 10

## 2019-10-25 MED ORDER — SODIUM CHLORIDE 0.9 % IV SOLN
30.0000 mg/m2 | Freq: Once | INTRAVENOUS | Status: AC
  Administered 2019-10-25: 40 mg via INTRAVENOUS
  Filled 2019-10-25: qty 4

## 2019-10-25 MED ORDER — SODIUM CHLORIDE 0.9 % IV SOLN
Freq: Once | INTRAVENOUS | Status: AC
  Filled 2019-10-25: qty 250

## 2019-10-25 MED ORDER — DEXAMETHASONE SODIUM PHOSPHATE 10 MG/ML IJ SOLN
10.0000 mg | Freq: Once | INTRAMUSCULAR | Status: AC
  Administered 2019-10-25: 10 mg via INTRAVENOUS

## 2019-10-25 NOTE — Patient Instructions (Signed)

## 2019-10-25 NOTE — Progress Notes (Signed)
Per Dr Burr Medico, ok to treat with ANC of 1.0 today.

## 2019-10-25 NOTE — Progress Notes (Signed)
[place]   [phone] Fax:([phone]   Clinic Follow up Note   Patient Care Team: [name], MD as PCP - General (Family Medicine) [name], MD as Consulting Physician (Otolaryngology) [name], MD as Consulting Physician (Oncology)   I connected with Morgan Dawson on 11/01/2019 at  8:20 AM EST by video enabled telemedicine visit and verified that I am speaking with the correct person using two identifiers.   I discussed the limitations, risks, security and privacy concerns of performing an evaluation and management service by telephone and the availability of in person appointments. I also discussed with the patient that there may be a patient responsible charge related to this service. The patient expressed understanding and agreed to proceed.   Other persons participating in the visit and their role in the encounter:  Husband   Patient's location:  Her home  Provider's location:  My Office  CHIEF COMPLAINT: F/u of pancreatic cancer  SUMMARY OF ONCOLOGIC HISTORY: Oncology History Overview Note  Cancer Staging Pancreatic cancer [facility]) Staging form: Exocrine Pancreas, AJCC 8th Edition - Clinical stage from 08/06/2017: Stage IV (cTX, cN0, pM1) - Signed by [name], MD on 08/12/2017     Pancreatic cancer ([place])  07/23/2017 Imaging   US abdomen limited RUQ 07/23/17 IMPRESSION: 1. Cholelithiasis.  No secondary signs of acute cholecystitis. 2. Multiple solid liver masses measuring up to 6.5 cm in the left lobe of the liver, evaluation for metastatic disease is recommended. These results will be called to the ordering clinician or representative by the Radiologist Assistant, and communication documented in the PACS or zVision Dashboard.   07/23/2017 Imaging   CT Abdomen W Contrast 07/23/17 IMPRESSION: 1. Widespread metastatic disease throughout the liver. No clear primary malignancy identified in the abdomen. The pelvis was  not imaged. Tissue sampling recommended. 2. Probable adenopathy superior to the pancreatic tail. No evidence of pancreatic mass. 3. Suspected incidental hemangioma inferiorly in the right hepatic lobe. 4. Nonspecific nodularity in the breasts. The patient has undergone recent (03/25/2017 and 04/01/2017) mammography and ultrasound.   07/29/2017 Initial Diagnosis   Metastasis to liver of unknown origin ([name])   07/29/2017 PET scan   PET 07/29/17  IMPRESSION: 1. Numerous bulky liver masses are hypermetabolic compatible with malignancy. 2. Accentuated activity within or along the pancreatic tail, likely represent a primary pancreatic tumor. Consider pancreatic protocol MRI to further work this up. 3. The peripancreatic lymph node shown above the pancreatic tail is mildly hypermetabolic favoring malignancy. 4.  Prominent stool throughout the colon favors constipation. 5. 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   08/05/2017 Pathology Results   Liver Biopsy  Diagnosis 08/05/17 Liver, needle/core biopsy - CARCINOMA. - SEE COMMENT. Microscopic Comment The malignant cells are positive for cytokeratin 7. They are negative for arginase, CDX2, cytokeratin 20, estrogen receptor, GATA-3, GCDFP, Glypican 3, Hep Par 1, Napsin A, and TTF-1. This immunohistochemical is nonspecific. Possibly primary sources include pancreatobiliary and upper gastrointestinal. Radiologic correlation is necessary. Of note, organ specific markers (GATA-3, GCDFP-breast, TTF-1, Napsin A-lung, and CDX2-colon) are negative. (JBK:ecj 08/09/2017)   08/21/2017 - 02/10/2018 Chemotherapy   FOLFIRINOX every 2 weeks starting 08/21/17. Dose reduced due to neuropahty and cytopenia    10/14/2017 Imaging   CT CAP WO Contrast 10/14/17 IMPRESSION: Evidence of known numerous liver metastases without significant interval change. No other evidence of metastatic disease within the chest, abdomen or pelvis. Cholelithiasis. Tiny  pericardial effusion.   12/02/2017 Genetic Testing   Negative for pathogenic  mutation.  The genes analyzed were the 83 genes on [facility]'s Multi-Cancer panel (ALK, APC, ATM, AXIN2, BAP1, BARD1, BLM, BMPR1A, BRCA1, BRCA2, BRIP1, CASR, CDC73, CDH1, CDK4, CDKN1B, CDKN1C, CDKN2A, CEBPA, CHEK2, CTNNA1, DICER1, DIS3L2, EGFR, EPCAM, FH, FLCN, GATA2, GPC3, GREM1, HOXB13, HRAS, KIT, MAX, MEN1, MET, MITF, MLH1, MSH2, MSH3, MSH6, MUTYH, NBN, NF1, NF2, NTHL1, PALB2, PDGFRA, PHOX2B, PMS2, POLD1, POLE, POT1, PRKAR1A, PTCH1, PTEN, RAD50, RAD51C, RAD51D, RB1, RECQL4, RET, RUNX1, SDHA, SDHAF2, SDHB, SDHC, SDHD, SMAD4, SMARCA4, SMARCB1, SMARCE1, STK11, SUFU, TERC, TERT, TMEM127, TP53, TSC1, TSC2, VHL, WRN, WT1).   12/17/2017 PET scan   IMPRESSION: 1. Although the liver metastases are still visible on the CT images, they are significantly smaller and retain no abnormal metabolic activity, consistent with response to therapy. 2. No abnormal activity within the pancreas. 3. No disease progression identified. 4. Cholelithiasis.   02/21/2018 PET scan   02/21/2018 PET Scan  IMPRESSION: 1. There are 2 new small nodules identified within the right lung which measure up to 5 mm. These are too small to reliably characterize by PET-CT, but warrant close interval follow-up. 2. Again noted are multifocal liver metastasis. The target lesion within the left lobe is slightly decreased in size when compared with the previous exam. Similar to previous exam there is no abnormal hypermetabolism above background liver activity identified within the liver lesions. 3. Gallstones.   02/23/2018 - 09/04/2018 Chemotherapy   5-FU pump infusion and liposomal irinotecan, every 2 weeks.  First cycle dose reduced due to cytopenia in the travel. Stopped on 08/25/2018 due to disease progression.    05/17/2018 Imaging   05/17/2018 PET Scan IMPRESSION: 1. Mixed response. The 2 small right lung nodules have decreased in size in the interval. Single  focus of increased uptake within the liver is new from 02/21/2018 and concerning for recurrent metabolically active liver metastasis. 2. Splenomegaly.  New from previous exam. 3. Diffusely increased bone marrow activity, likely reflecting treatment related changes.   09/01/2018 PET scan   PET 09/01/18 IMPRESSION: 1. Unfortunately there recurrence of hepatic metastasis with multiple new hypermetabolic lesions in LEFT and RIGHT hepatic lobe. 2. New extra hepatic site of malignancy which appears associated the mid pancreas. Difficult to define lesion on noncontrast exam. 3. Diffuse marrow activity is favored benign. 4. No evidence of pulmonary metastasis.   09/12/2018 - 03/27/2019 Chemotherapy   second-line Gemcitabine and Abraxane for 2 weeks on, 1 week off starting 09/12/2018. Due to neuropathy, I will start her with dose reduced Abraxane. Stopped after 03/27/19 due to disease progression.    12/14/2018 Imaging   CT CAP  IMPRESSION: CT demonstrates acute cholecystitis, with choledocholithiasis of the cystic duct.  These results were discussed by telephone at the time of interpretation on 12/14/2018 at 5:05 pm with Dr. Burr Medico.  Redemonstration of known liver metastases, with positive response to therapy, interval reduction in size of all lesions, with multiple demonstrating significant internal necrosis.  No acute finding of the chest.  Ancillary findings as above.   03/21/2019 Imaging   CT CAP W Contrast IMPRESSION: 1. Numerous rim enhancing liver metastases show overall trend towards mild progression although 1 of the index lesions is stable in the interval. 2. Interval development of portocaval lymphadenopathy concerning for disease progression. 3. Slight progression of main duct dilatation in the pancreas with abrupt cut off in the region of the body. 4. Interval development of a 9 mm subpleural nodule at the right base along the posterior hemidiaphragm. Close attention on  follow-up recommended as  metastatic disease not excluded. 5. New splenomegaly. 6. Cholelithiasis with diffuse gallbladder wall thickening. Gallbladder wall thickening is decreased in the interval. Stones and sludge noted in the gallbladder lumen.   04/09/2019 Imaging   CT AP W Contrast 04/09/19  IMPRESSION: 1. Multiple gallstones in lumen gallbladder without evidence acute cholecystitis. No significant change from prior. No biliary duct dilatation. Common bile duct normal. 2. Widespread hepatic metastasis unchanged from comparison CT. 3. Chronic pancreatic duct dilatation in the body and tail unchanged. 4. No bowel obstruction. 5. Small amount of intraperitoneal free fluid unchanged.   04/10/2019 -  Chemotherapy   Third-line GTX with Xeloda 1060m BID day 1-14, off for 1 week with Docetaxel and Gemcitbine on day 4 and day 11 every 3 weeks starting oral chemo on 04/10/19.    07/03/2019 Imaging   CT CAP W Contrast  IMPRESSION: 1. Overall stable diffuse hepatic metastatic disease. No new/progressive findings. 2. Stable to slightly larger periportal lymph node. 3. Stable CT appearance of the pancreas. Abrupt cut off of a dilated main pancreatic duct in the body/head junction region without obvious pancreatic lesion. 4. No pulmonary nodules are identified at the lung bases. 5. Stable mild splenomegaly. 6. No new/acute findings.     10/17/2019 Imaging   CT CAP W Contrast  IMPRESSION: 1. Overall mild progression of metastatic disease. Portacaval lymphadenopathy has increased. Enlarging lesion along the posterior right diaphragm is suspicious for growing right pleural metastasis. 2. Bulky liver metastatic disease is relatively stable with mild mixed changes as detailed. 3. Pancreatic body tumor is stable. 4. Moderate splenomegaly, increased. 5. Aortic Atherosclerosis (ICD10-I70.0). Additional chronic findings as detailed.       Panc[place]as[place] liver ([place]   08/11/2017 Initial Diagnosis   Pancreatic cancer metastasized to liver ([facility]      CURRENT THERAPY:  Third-line GTX with Xeloda10047mID day 1-14, off for 1 week with Docetaxel3050m2and Gemcitabine750m66mon day 4 and day 11 every 3 weeks starting oral chemo on 04/10/19.Starting 07/06/19 she will reduce Xeloda to 1000mg69mthe AM and 500mg 43mhe PM due to low blood counts.  INTERVAL HISTORY:  JaniceCRISTA NUONre for a follow up and discuss clinical trial options. They identified themselves by face to face video.  She is clinically stable, denies any new symptoms.  Her appetite and energy level are decent, no recent abdominal pain, fever or other new symptoms.  MEDICAL HISTORY:  Past Medical History:  Diagnosis Date  . Diarrhea of presumed infectious origin 01/12/2018  . Gastroenteritis 01/12/2018  . Genetic testing 12/02/2017   Multi-Cancer panel (83 genes) @ [facility] - No pathogenic mutations detected  . Meniere disease 2016  . met pancreatic ca to liver dx'd 07/2017  . Pancreatitis     SURGICAL HISTORY: Past Surgical History:  Procedure Laterality Date  . CERVICAL ABLATION    . ENDOMETRIAL ABLATION  2011  . IR CHOLANGIOGRAM EXISTING TUBE  01/09/2019  . IR FLUORO GUIDE PORT INSERTION RIGHT  08/12/2017  . IR PERC CHOLECYSTOSTOMY  12/15/2018  . IR US GUI[place] VASC ACCESS RIGHT  08/12/2017  . MANDIBLurayhave reviewed the social history and family history with the patient and they are unchanged from previous note.  ALLERGIES:  has No Known Allergies.  MEDICATIONS:  Current Outpatient Medications  Medication Sig Dispense Refill  . b complex vitamins tablet Take 1 tablet by mouth daily.    . capecitabine (XELODA) 500 MG tablet TAKE 2 TABLETS BY  MOUTH IN THE MORNING AND 1 TAB IN EVENING, EVERY 12 HOURS, WITHIN 30  MINUTES OF MEALS ON DAYS 1  TO 14 OF EACH 21 DAY CYCLE 56 tablet 0  . Cholecalciferol (VITAMIN D3 GUMMIES ADULT) 25 MCG (1000 UT) CHEW Chew by  mouth. Taking 2 gummies    . cyclobenzaprine (FLEXERIL) 5 MG tablet Take 1 tablet (5 mg total) by mouth 3 (three) times daily as needed for muscle spasms. 30 tablet 0  . eltrombopag (PROMACTA) 50 MG tablet Take 1 tablet (50 mg total) by mouth daily. Take on an empty stomach 1 hour before a meal or 2 hours after 30 tablet 2  . gabapentin (NEURONTIN) 100 MG capsule Take 1 capsule (100 mg total) by mouth 3 (three) times daily. 270 capsule 2  . lidocaine-prilocaine (EMLA) cream Apply 1 application topically as needed. 30 g 2  . loperamide (IMODIUM) 2 MG capsule Take 1 capsule (2 mg total) by mouth as needed for diarrhea or loose stools. 30 capsule 0  . loratadine (CLARITIN) 10 MG tablet Take 10 mg by mouth daily as needed for allergies.    . magic mouthwash w/lidocaine SOLN Take 10 mLs by mouth 3 (three) times daily as needed for mouth pain. Swish and spit 10 ML by mouth 3 times daily as needed for mouth pain. 240 mL 0  . meclizine (ANTIVERT) 25 MG tablet Take 25 mg by mouth 3 (three) times daily as needed for dizziness.    . ondansetron (ZOFRAN-ODT) 8 MG disintegrating tablet TAKE 1 TABLET BY MOUTH EVERY 8 HOURS AS NEEDED FOR NAUSE OR VOMITING 40 tablet 1  . potassium chloride (KLOR-CON) 10 MEQ tablet Take 1 tablet (10 mEq total) by mouth 4 (four) times daily. 360 tablet 2  . prochlorperazine (COMPAZINE) 10 MG tablet Take 1 tablet (10 mg total) by mouth every 6 (six) hours as needed for nausea or vomiting. 40 tablet 2  . Rivaroxaban (XARELTO) 15 MG TABS tablet TAKE 1 TABLET(15 MG) BY MOUTH DAILY 90 tablet 1  . traMADol (ULTRAM) 50 MG tablet Take 1 tablet (50 mg total) by mouth every 6 (six) hours as needed for moderate pain or severe pain. 30 tablet 0   No current facility-administered medications for this visit.   Facility-Administered Medications Ordered in Other Visits  Medication Dose Route Frequency Provider Last Rate Last Admin  . sodium chloride flush (NS) 0.9 % injection 10 mL  10 mL  Intracatheter Once [name], MD        PHYSICAL EXAMINATION: ECOG PERFORMANCE STATUS: 1 - Symptomatic but completely ambulatory  No vitals taken today, Exam not performed today   LABORATORY DATA:  I have reviewed the data as listed CBC Latest Ref Rng & Units 10/25/2019 10/17/2019 10/04/2019  WBC 4.0 - 10.5 K/uL 1.5(L) 4.2 2.0(L)  Hemoglobin 12.0 - 15.0 g/dL 8.6(L) 9.1(L) 8.5(L)  Hematocrit 36.0 - 46.0 % 26.9(L) 28.2(L) 26.4(L)  Platelets 150 - 400 K/uL 192 248 196     CMP Latest Ref Rng & Units 10/25/2019 10/17/2019 10/04/2019  Glucose 70 - 99 mg/dL 121(H) 101(H) 132(H)  BUN 6 - 20 mg/dL _0 Creatinine 0.44 - 1.00 mg/dL 0.93 1.00 1.00  Sodium 135 - 145 mmol/L 141 140 143  Potassium 3.5 - 5.1 mmol/L 3.7 3.5 3.4(L)  Chloride 98 - 111 mmol/L 104 103 104  CO2 22 - 32 mmol/L _1 Calcium 8.9 - 10.3 mg/dL 9.2 9.0 9.5  Total Protein 6.5 - 8.1 g/dL  7.1 7.1 7.2  Total Bilirubin 0.3 - 1.2 mg/dL 0.7 0.8 0.6  Alkaline Phos 38 - 126 U/L 183(H) 161(H) 196(H)  AST 15 - 41 U/L 46(H) 40 39  ALT 0 - 44 U/L 32 24 28      RADIOGRAPHIC STUDIES: I have personally reviewed the radiological images as listed and agreed with the findings in the report. No results found.   ASSESSMENT & PLAN:  Morgan Dawson is a 58 y.o. female with    1. 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 She progressed on this based on 08/2018 PET scan. -She mildly progressed onsecond-line Gemcitabine and Abraxanein liver and new lung nodule.  -She is currently onthird-line GTX with Xeloda 1047m BID 2 weeks on/1 week off andDocetaxelandGemcitabineon day 4 and 11 q3weeks starting 04/10/19.Granix has been added to week 3 due toneutropenia. Starting 07/03/19 Xeloda has been decreased to10052min the AM and 50087mn the PMdPinnacleer CT CAP from 10/17/19 shows overall mild progression of metastatic disease. Her last Ca 19.9 continues to fluctuate, but overall stable.  -[facility]. Right [facility], [facility], one phase 1 (open) and one phase 2 (upcoming soon). I have reached out to Dr. McRAleatha Borer [facility]. She is [facility].  We discussed the washout time before she can participate clinical trial, which is usually 4 weeks.  We discussed high screening failure for clinical trial, and high possibility of disease progression while she is off treatment and waiting to participate clinical trial.  She voiced good understanding. -I have communicated with Dr. MetDennison Nancy [facility] she is not eligible for their trial, she recommended testing NRG1 fusion to see if she is a candidate for targeted therapy. 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 She is agreeable.  -I will look into other next line treatment if she is not able to proceed with clinical trail.  -She plans to proceed with her trip in late April.   -f/u with NP Lacie on 3/17 with day 4 GTX   2.Abdominal pain -Continue tramadol PRN for pain, has not neededmuchrecently, no fever recently  3. Malnutrition -Due to chemo and underlying metastatic cancer. -Continue nutritional supplement and f/u with dietician. -Appetite and eatinghas been fair but she has been able to maintain weight well.Stable.   4. Transaminitis -Due to underlying chemo and malignancy  5. Pancytopenia, secondary to chemo -Required chemo dose reductions. -S/p blood  transfusion as needed withHg<8. Last on 09/20/18 -OnPromacta, willcontinue. Due to worsening thrombocytopenia from chemo, increasedto 50mg 28my starting 05/31/19. -Granix has been added tothirdweek and Xeloda dose reduced starting 07/03/19  -She is responding well to Granix.    6. Hypokalemia, CKD Stage III -Sheis currently on KCL10meq 10m-Has been stable.  7.Peripheral neuropathy, grade 2  -Currently on Neurontin 200 mg at night and Bcomplex.She used ice bags during infusion. -Mild and stable in hands and has mildly progressed  in feet with current chemo. Will continue to monitor. Stable.   8.H/o Acute cholecystitis, status post cholecystectomy tube placement 4/23/20per IR -She was seen by surgery during herhospitalization, surgery was not recommended due to high risk of complication, and concerns for cancer progression when holding chemotherapy for surgery -Drain intact,currently has decreased to 62m slightlybloody drainage per day -afebrileand no chills. -Her CT CAP from 12/14/18 shows she has numerus gallstones. Will monitor for any inflammation. -Imaging in 12/2018 consistent with cholecystitis with cystic duct obstruction. No leak on HIDA. -She waspreviouslygiven prophylactic 5-day course levaquin on 04/13/19 due to fever spikes during chemo -noevidence of recurrent infectionrecently.  9. LE DVT -Diagnosed on December 15, 2018, when she was hospitalized for cholecystitis -Due to her bloody biliary drainage, and moderate thrombocytopenia from chemotherapy, she is on low-doseXarelto145mdaily (no loading dose), she is tolerating well and will continue  10.Goal of care discussion -She is full code now -She understands her cancer is incurable at this stage, and the goal of therapy is palliative, to prolong his life, and prevent cancer related symptoms  11. Diarrhea  -With recent cycles and current cycle her diarrhea has increased and occurs earlier.  She has been using imodium which helps. I recommend she use 2 tabs of imodium at first use and 2 more as needed.  -So far manageable.    Plan -Start C11 with  Xeloda100031mn the AM and 500m22m the PM on 3/14.  -Lab, flush, f/u with NP Lacie and day 4 Gemcitabine/Docetaxelon 3/17. -Granix on day 15  -I have referred her to [facility] clinical trial consideration  -will order [facility] testing for NRG1 fusion   No problem-specific Assessment & Plan notes found for this encounter.   No orders of the defined types were placed in this encounter.  I discussed the assessment and treatment plan with the patient. The patient was provided an opportunity to ask questions and all were answered. The patient agreed with the plan and demonstrated an understanding of the instructions.  The patient was advised to call back or seek an in-person evaluation if the symptoms worsen or if the condition fails to improve as anticipated.  The total time spent in the appointment was 40 minutes.    Morgan Dawson [name] 11/01/2019   I, AmoyJoslyn Devon acting as scribe for [employer] [name].   I have reviewed the above documentation for accuracy and completeness, and I agree with the above.

## 2019-10-25 NOTE — Patient Instructions (Signed)
[place] Discharge Instructions for Patients Receiving Chemotherapy  Today you received the following chemotherapy agents: gemcitabine and docetaxel.  To help prevent nausea and vomiting after your treatment, we encourage you to take your nausea medication as directed.   If you develop nausea and vomiting that is not controlled by your nausea medication, call the clinic.   BELOW ARE SYMPTOMS THAT SHOULD BE REPORTED IMMEDIATELY:  *FEVER GREATER THAN 100.5 F  *CHILLS WITH OR WITHOUT FEVER  NAUSEA AND VOMITING THAT IS NOT CONTROLLED WITH YOUR NAUSEA MEDICATION  *UNUSUAL SHORTNESS OF BREATH  *UNUSUAL BRUISING OR BLEEDING  TENDERNESS IN MOUTH AND THROAT WITH OR WITHOUT PRESENCE OF ULCERS  *URINARY PROBLEMS  *BOWEL PROBLEMS  UNUSUAL RASH Items with * indicate a potential emergency and should be followed up as soon as possible.  Feel free to call the clinic should you have any questions or concerns. The clinic phone number is [phone].  Please show the [place] at check-in to the Emergency Department and triage nurse.

## 2019-10-27 ENCOUNTER — Other Ambulatory Visit: Payer: Self-pay | Admitting: Hematology

## 2019-10-27 ENCOUNTER — Encounter: Payer: Self-pay | Admitting: Hematology

## 2019-10-27 ENCOUNTER — Telehealth: Payer: Self-pay

## 2019-10-27 NOTE — Telephone Encounter (Signed)
Messge sent to HIM to send recors to Dr. Aleatha Borer, [facility], new patient referral.

## 2019-10-30 ENCOUNTER — Inpatient Hospital Stay: Payer: BLUE CROSS/BLUE SHIELD

## 2019-10-30 ENCOUNTER — Other Ambulatory Visit: Payer: Self-pay

## 2019-10-30 VITALS — BP 128/72 | HR 72 | Temp 98.2°F | Resp 18

## 2019-10-30 DIAGNOSIS — Z95828 Presence of other vascular implants and grafts: Secondary | ICD-10-CM

## 2019-10-30 DIAGNOSIS — C787 Secondary malignant neoplasm of liver and intrahepatic bile duct: Secondary | ICD-10-CM | POA: Diagnosis not present

## 2019-10-30 DIAGNOSIS — C259 Malignant neoplasm of pancreas, unspecified: Secondary | ICD-10-CM

## 2019-10-30 MED ORDER — FILGRASTIM-SNDZ 300 MCG/0.5ML IJ SOSY
PREFILLED_SYRINGE | INTRAMUSCULAR | Status: AC
  Filled 2019-10-30: qty 0.5

## 2019-10-30 MED ORDER — FILGRASTIM-SNDZ 300 MCG/0.5ML IJ SOSY
300.0000 ug | PREFILLED_SYRINGE | Freq: Once | INTRAMUSCULAR | Status: AC
  Administered 2019-10-30: 300 ug via SUBCUTANEOUS

## 2019-11-01 ENCOUNTER — Encounter: Payer: Self-pay | Admitting: Hematology

## 2019-11-01 ENCOUNTER — Inpatient Hospital Stay (HOSPITAL_BASED_OUTPATIENT_CLINIC_OR_DEPARTMENT_OTHER): Payer: BLUE CROSS/BLUE SHIELD | Admitting: Hematology

## 2019-11-01 DIAGNOSIS — C251 Malignant neoplasm of body of pancreas: Secondary | ICD-10-CM

## 2019-11-06 ENCOUNTER — Ambulatory Visit: Payer: BLUE CROSS/BLUE SHIELD | Attending: Internal Medicine

## 2019-11-06 DIAGNOSIS — Z23 Encounter for immunization: Secondary | ICD-10-CM

## 2019-11-06 NOTE — Progress Notes (Signed)
   Covid-19 Vaccination Clinic  Name:  Morgan Dawson    MRN: [id number] DOB: 1960/12/08  11/06/2019  [name] was observed post Covid-19 immunization for 15 minutes without incident. She was provided with Vaccine Information Sheet and instruction to access the V-Safe system.   [name] was instructed to call 911 with any severe reactions post vaccine: Marland Kitchen Difficulty breathing  . Swelling of face and throat  . A fast heartbeat  . A bad rash all over body  . Dizziness and weakness   Immunizations Administered    Name Date Dose VIS Date Route   Pfizer COVID-19 Vaccine 11/06/2019  8:20 AM 0.3 mL 08/04/2019 Intramuscular   Manufacturer: [place]   Lot: [id number]   [place]: [id number]

## 2019-11-07 ENCOUNTER — Other Ambulatory Visit: Payer: Self-pay | Admitting: Hematology

## 2019-11-07 NOTE — Progress Notes (Signed)
[place]   [phone] [phone]   Clinic Follow up Note   Patient Care Team: [name], MD as PCP - General (Family Medicine) [name], MD as Consulting Physician (Otolaryngology) [name], MD as Consulting Physician (Oncology) 11/08/2019  CHIEF COMPLAINT: F/u metastatic pancreas cancer   SUMMARY OF ONCOLOGIC HISTORY: Oncology History Overview Note  Cancer Staging Pancreatic cancer [facility]) Staging form: Exocrine Pancreas, AJCC 8th Edition - Clinical stage from 08/06/2017: Stage IV (cTX, cN0, pM1) - Signed by [name], MD on 08/12/2017     Pancreatic cancer ([place])  07/23/2017 Imaging   US abdomen limited RUQ 07/23/17 IMPRESSION: 1. Cholelithiasis.  No secondary signs of acute cholecystitis. 2. Multiple solid liver masses measuring up to 6.5 cm in the left lobe of the liver, evaluation for metastatic disease is recommended. These results will be called to the ordering clinician or representative by the Radiologist Assistant, and communication documented in the PACS or zVision Dashboard.   07/23/2017 Imaging   CT Abdomen W Contrast 07/23/17 IMPRESSION: 1. Widespread metastatic disease throughout the liver. No clear primary malignancy identified in the abdomen. The pelvis was not imaged. Tissue sampling recommended. 2. Probable adenopathy superior to the pancreatic tail. No evidence of pancreatic mass. 3. Suspected incidental hemangioma inferiorly in the right hepatic lobe. 4. Nonspecific nodularity in the breasts. The patient has undergone recent (03/25/2017 and 04/01/2017) mammography and ultrasound.   07/29/2017 Initial Diagnosis   Metastasis to liver of unknown origin ([place])   07/29/2017 PET scan   PET 07/29/17  IMPRESSION: 1. Numerous bulky liver masses are hypermetabolic compatible with malignancy. 2. Accentuated activity within or along the pancreatic tail, likely represent a primary pancreatic tumor.  Consider pancreatic protocol MRI to further work this up. 3. The peripancreatic lymph node shown above the pancreatic tail is mildly hypermetabolic favoring malignancy. 4.  Prominent stool throughout the colon favors constipation. 5. 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   08/05/2017 Pathology Results   Liver Biopsy  Diagnosis 08/05/17 Liver, needle/core biopsy - CARCINOMA. - SEE COMMENT. Microscopic Comment The malignant cells are positive for cytokeratin 7. They are negative for arginase, CDX2, cytokeratin 20, estrogen receptor, GATA-3, GCDFP, Glypican 3, Hep Par 1, Napsin A, and TTF-1. This immunohistochemical is nonspecific. Possibly primary sources include pancreatobiliary and upper gastrointestinal. Radiologic correlation is necessary. Of note, organ specific markers (GATA-3, GCDFP-breast, TTF-1, Napsin A-lung, and CDX2-colon) are negative. (JBK:ecj 08/09/2017)   08/21/2017 - 02/10/2018 Chemotherapy   FOLFIRINOX every 2 weeks starting 08/21/17. Dose reduced due to neuropahty and cytopenia    10/14/2017 Imaging   CT CAP WO Contrast 10/14/17 IMPRESSION: Evidence of known numerous liver metastases without significant interval change. No other evidence of metastatic disease within the chest, abdomen or pelvis. Cholelithiasis. Tiny pericardial effusion.   12/02/2017 Genetic Testing   Negative for pathogenic mutation.  The genes analyzed were the 83 genes on [facility]'s Multi-Cancer panel (ALK, APC, ATM, AXIN2, BAP1, BARD1, BLM, BMPR1A, BRCA1, BRCA2, BRIP1, CASR, CDC73, CDH1, CDK4, CDKN1B, CDKN1C, CDKN2A, CEBPA, CHEK2, CTNNA1, DICER1, DIS3L2, EGFR, EPCAM, FH, FLCN, GATA2, GPC3, GREM1, HOXB13, HRAS, KIT, MAX, MEN1, MET, MITF, MLH1, MSH2, MSH3, MSH6, MUTYH, NBN, NF1, NF2, NTHL1, PALB2, PDGFRA, PHOX2B, PMS2, POLD1, POLE, POT1, PRKAR1A, PTCH1, PTEN, RAD50, RAD51C, RAD51D, RB1, RECQL4, RET, RUNX1, SDHA, SDHAF2, SDHB, SDHC, SDHD, SMAD4, SMARCA4, SMARCB1, SMARCE1, STK11, SUFU, TERC, TERT,  TMEM127, TP53, TSC1, TSC2, VHL, WRN, WT1).   12/17/2017 PET scan   IMPRESSION: 1. Although the liver metastases are still visible on  the CT images, they are significantly smaller and retain no abnormal metabolic activity, consistent with response to therapy. 2. No abnormal activity within the pancreas. 3. No disease progression identified. 4. Cholelithiasis.   02/21/2018 PET scan   02/21/2018 PET Scan  IMPRESSION: 1. There are 2 new small nodules identified within the right lung which measure up to 5 mm. These are too small to reliably characterize by PET-CT, but warrant close interval follow-up. 2. Again noted are multifocal liver metastasis. The target lesion within the left lobe is slightly decreased in size when compared with the previous exam. Similar to previous exam there is no abnormal hypermetabolism above background liver activity identified within the liver lesions. 3. Gallstones.   02/23/2018 - 09/04/2018 Chemotherapy   5-FU pump infusion and liposomal irinotecan, every 2 weeks.  First cycle dose reduced due to cytopenia in the travel. Stopped on 08/25/2018 due to disease progression.    05/17/2018 Imaging   05/17/2018 PET Scan IMPRESSION: 1. Mixed response. The 2 small right lung nodules have decreased in size in the interval. Single focus of increased uptake within the liver is new from 02/21/2018 and concerning for recurrent metabolically active liver metastasis. 2. Splenomegaly.  New from previous exam. 3. Diffusely increased bone marrow activity, likely reflecting treatment related changes.   09/01/2018 PET scan   PET 09/01/18 IMPRESSION: 1. Unfortunately there recurrence of hepatic metastasis with multiple new hypermetabolic lesions in LEFT and RIGHT hepatic lobe. 2. New extra hepatic site of malignancy which appears associated the mid pancreas. Difficult to define lesion on noncontrast exam. 3. Diffuse marrow activity is favored benign. 4. No evidence of pulmonary  metastasis.   09/12/2018 - 03/27/2019 Chemotherapy   second-line Gemcitabine and Abraxane for 2 weeks on, 1 week off starting 09/12/2018. Due to neuropathy, I will start her with dose reduced Abraxane. Stopped after 03/27/19 due to disease progression.    12/14/2018 Imaging   CT CAP  IMPRESSION: CT demonstrates acute cholecystitis, with choledocholithiasis of the cystic duct.  These results were discussed by telephone at the time of interpretation on 12/14/2018 at 5:05 pm with Dr. Burr Medico.  Redemonstration of known liver metastases, with positive response to therapy, interval reduction in size of all lesions, with multiple demonstrating significant internal necrosis.  No acute finding of the chest.  Ancillary findings as above.   03/21/2019 Imaging   CT CAP W Contrast IMPRESSION: 1. Numerous rim enhancing liver metastases show overall trend towards mild progression although 1 of the index lesions is stable in the interval. 2. Interval development of portocaval lymphadenopathy concerning for disease progression. 3. Slight progression of main duct dilatation in the pancreas with abrupt cut off in the region of the body. 4. Interval development of a 9 mm subpleural nodule at the right base along the posterior hemidiaphragm. Close attention on follow-up recommended as metastatic disease not excluded. 5. New splenomegaly. 6. Cholelithiasis with diffuse gallbladder wall thickening. Gallbladder wall thickening is decreased in the interval. Stones and sludge noted in the gallbladder lumen.   04/09/2019 Imaging   CT AP W Contrast 04/09/19  IMPRESSION: 1. Multiple gallstones in lumen gallbladder without evidence acute cholecystitis. No significant change from prior. No biliary duct dilatation. Common bile duct normal. 2. Widespread hepatic metastasis unchanged from comparison CT. 3. Chronic pancreatic duct dilatation in the body and tail unchanged. 4. No bowel obstruction. 5. Small  amount of intraperitoneal free fluid unchanged.   04/10/2019 -  Chemotherapy   Third-line GTX with Xeloda 1051m BID day 1-14,  off for 1 week with Docetaxel and Gemcitbine on day 4 and day 11 every 3 weeks starting oral chemo on 04/10/19.    07/03/2019 Imaging   CT CAP W Contrast  IMPRESSION: 1. Overall stable diffuse hepatic metastatic disease. No new/progressive findings. 2. Stable to slightly larger periportal lymph node. 3. Stable CT appearance of the pancreas. Abrupt cut off of a dilated main pancreatic duct in the body/head junction region without obvious pancreatic lesion. 4. No pulmonary nodules are identified at the lung bases. 5. Stable mild splenomegaly. 6. No new/acute findings.     10/17/2019 Imaging   CT CAP W Contrast  IMPRESSION: 1. Overall mild progression of metastatic disease. Portacaval lymphadenopathy has increased. Enlarging lesion along the posterior right diaphragm is suspicious for growing right pleural metastasis. 2. Bulky liver metastatic disease is relatively stable with mild mixed changes as detailed. 3. Pancreatic body tumor is stable. 4. Moderate splenomegaly, increased. 5. Aortic Atherosclerosis (ICD10-I70.0). Additional chronic findings as detailed.       Pancreatic cancer metastasized to liver ([place])  08/11/2017 Initial Diagnosis   Pancreatic cancer metastasized to liver [facility])     CURRENT THERAPY:  Third-line GTX with Xeloda10102mBID day 1-14, off for 1 week with Docetaxel362mm2and Gemcitabine75085m2on day 4 and day 11 every 3 weeks starting oral chemo on 04/10/19.Starting 07/06/19 Morgan Dawson will reduce Xeloda to 1000m40m the AM and 500mg49mthe PM due to low blood counts.  INTERVAL HISTORY: Morgan Dawson for f/u and treatment as scheduled. Morgan Dawson completed last cycle with granix on 3/8. Morgan Dawson had second dose of Pfizer covid19 vaccine on 3/15 and started Xeloda that day. Morgan Dawson had low grade fever that day and on 3/16, previous to that her last  fever 100.6 was on day 13 of last cycle. No chills. Morgan Dawson is doing well. Appetite and energy at baseline. Bowels regular for her, soft/loose. Denies n/v. Denies mucositis. Denies hand/foot redness or pain. Hands are dry, neuropathy at baseline. Denies new or worsening pain. No recent cough, chest pain, dyspnea. Morgan Dawson [facility].    MEDICAL HISTORY:  Past Medical History:  Diagnosis Date  . Diarrhea of presumed infectious origin 01/12/2018  . Gastroenteritis 01/12/2018  . Genetic testing 12/02/2017   Multi-Cancer panel (83 genes) @ [facility] - No pathogenic mutations detected  . Meniere disease 2016  . met pancreatic ca to liver dx'd 07/2017  . Pancreatitis     SURGICAL HISTORY: Past Surgical History:  Procedure Laterality Date  . CERVICAL ABLATION    . ENDOMETRIAL ABLATION  2011  . IR CHOLANGIOGRAM EXISTING TUBE  01/09/2019  . IR FLUORO GUIDE PORT INSERTION RIGHT  08/12/2017  . IR PERC CHOLECYSTOSTOMY  12/15/2018  . IR US [place] VASC ACCESS RIGHT  08/12/2017  . [place] have reviewed the social history and family history with the patient and they are unchanged from previous note.  ALLERGIES:  has No Known Allergies.  MEDICATIONS:  Current Outpatient Medications  Medication Sig Dispense Refill  . b complex vitamins tablet Take 1 tablet by mouth daily.    . capecitabine (XELODA) 500 MG tablet TAKE 2 TABLETS BY MOUTH IN THE MORNING AND 1 TAB IN EVENING, EVERY 12 HOURS, WITHIN 30  MINUTES OF MEALS ON DAYS 1  TO 14 OF EACH 21 DAY CYCLE 56 tablet 0  . Cholecalciferol (VITAMIN D3 GUMMIES ADULT) 25 MCG (1000 UT) CHEW Chew by mouth. Taking 2 gummies    . cyclobenzaprine (FLEXERIL)  5 MG tablet Take 1 tablet (5 mg total) by mouth 3 (three) times daily as needed for muscle spasms. 30 tablet 0  . eltrombopag (PROMACTA) 50 MG tablet Take 1 tablet (50 mg total) by mouth daily. Take on an empty stomach 1 hour before a meal or 2 hours after 30 tablet 2  . gabapentin  (NEURONTIN) 100 MG capsule Take 1 capsule (100 mg total) by mouth 3 (three) times daily. 270 capsule 2  . lidocaine-prilocaine (EMLA) cream Apply 1 application topically as needed. 30 g 2  . loperamide (IMODIUM) 2 MG capsule Take 1 capsule (2 mg total) by mouth as needed for diarrhea or loose stools. 30 capsule 0  . loratadine (CLARITIN) 10 MG tablet Take 10 mg by mouth daily as needed for allergies.    . magic mouthwash w/lidocaine SOLN Take 10 mLs by mouth 3 (three) times daily as needed for mouth pain. Swish and spit 10 ML by mouth 3 times daily as needed for mouth pain. 240 mL 0  . meclizine (ANTIVERT) 25 MG tablet Take 25 mg by mouth 3 (three) times daily as needed for dizziness.    . ondansetron (ZOFRAN-ODT) 8 MG disintegrating tablet TAKE 1 TABLET BY MOUTH EVERY 8 HOURS AS NEEDED FOR NAUSE OR VOMITING 40 tablet 1  . potassium chloride (KLOR-CON) 10 MEQ tablet Take 1 tablet (10 mEq total) by mouth 4 (four) times daily. 360 tablet 2  . prochlorperazine (COMPAZINE) 10 MG tablet Take 1 tablet (10 mg total) by mouth every 6 (six) hours as needed for nausea or vomiting. 40 tablet 2  . Rivaroxaban (XARELTO) 15 MG TABS tablet TAKE 1 TABLET(15 MG) BY MOUTH DAILY 90 tablet 1  . traMADol (ULTRAM) 50 MG tablet Take 1 tablet (50 mg total) by mouth every 6 (six) hours as needed for moderate pain or severe pain. 30 tablet 0   No current facility-administered medications for this visit.   Facility-Administered Medications Ordered in Other Visits  Medication Dose Route Frequency Provider Last Rate Last Admin  . DOCEtaxel (TAXOTERE) 40 mg in sodium chloride 0.9 % 100 mL chemo infusion  30 mg/m2 (Treatment Plan Recorded) Intravenous Once [name], MD 104 mL/hr at 11/08/19 1227 40 mg at 11/08/19 1227  . heparin lock flush 100 unit/mL  500 Units Intracatheter Once PRN [name], MD      . sodium chloride flush (NS) 0.9 % injection 10 mL  10 mL Intracatheter Once [name], MD      . sodium chloride flush (NS)  0.9 % injection 10 mL  10 mL Intracatheter PRN [name], MD        PHYSICAL EXAMINATION: ECOG PERFORMANCE STATUS: 1 - Symptomatic but completely ambulatory  Vitals:   11/08/19 0846  BP: 105/67  Pulse: 88  Resp: 16  Temp: 98.2 F (36.8 C)  SpO2: 100%   Filed Weights   11/08/19 0846  Weight: 107 lb 4.8 oz (48.7 kg)    GENERAL:alert, no distress and comfortable SKIN: no rash. Palms without erythema  EYES:  sclera clear OROPHARYNX: no thrush or ulcers LUNGS: clear with normal breathing effort HEART: regular rate & rhythm, no lower extremity edema ABDOMEN:abdomen soft, non-tender and normal bowel sounds NEURO: alert & oriented x 3 with fluent speech PAC without erythema   LABORATORY DATA:  I have reviewed the data as listed CBC Latest Ref Rng & Units 11/08/2019 10/25/2019 10/17/2019  WBC 4.0 - 10.5 K/uL 5.2 1.5(L) 4.2  Hemoglobin 12.0 - 15.0 g/dL 9.0(L)  8.6(L) 9.1(L)  Hematocrit 36.0 - 46.0 % 28.4(L) 26.9(L) 28.2(L)  Platelets 150 - 400 K/uL 350 192 248     CMP Latest Ref Rng & Units 11/08/2019 10/25/2019 10/17/2019  Glucose 70 - 99 mg/dL 140(H) 121(H) 101(H)  BUN 6 - 20 mg/dL _0 Creatinine 0.44 - 1.00 mg/dL 1.06(H) 0.93 1.00  Sodium 135 - 145 mmol/L 140 141 140  Potassium 3.5 - 5.1 mmol/L 3.6 3.7 3.5  Chloride 98 - 111 mmol/L 104 104 103  CO2 22 - 32 mmol/L _1 Calcium 8.9 - 10.3 mg/dL 8.9 9.2 9.0  Total Protein 6.5 - 8.1 g/dL 6.6 7.1 7.1  Total Bilirubin 0.3 - 1.2 mg/dL 0.8 0.7 0.8  Alkaline Phos 38 - 126 U/L 161(H) 183(H) 161(H)  AST 15 - 41 U/L 40 46(H) 40  ALT 0 - 44 U/L 20 32 24      RADIOGRAPHIC STUDIES: I have personally reviewed the radiological images as listed and agreed with the findings in the report. No results found.   ASSESSMENT & PLAN: Morgan Dawson a 59 y.o.femalewith   1. 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 Morgan Dawson was treated withfirst linechemoFOLFIRINOX  with dose reduction. Due to neuropathyand cytopenia, chemo changed tosecond line 5-FU pump infusion and liposomal irinotecan every 2 weekswith dose reductionuntil Morgan Dawson progressed in the liver.  -Shewas onsecond-line Gemcitabine and Abraxane2 weeks on/1 week offon 1/20/20until progression in the liver and new lung nodule. D/c'd 03/27/19 -Morgan Dawson started third line GTX, Xeloda 1000 mg AM/500 mg PM days 1-14 and gem/taxotere on days 4 and 11, starting on 04/10/19, tolerating well. S/p 10 cycles  - CT CAP from 10/17/19 shows mild disease progression, CA 19-9 fluctuates, level is pending from today.  -Morgan Dawson has been referred to [facility], appt on 3/25 to see if Morgan Dawson is eligible for clinical trial.  -Proceeding with cycle 11 GTX while Morgan Dawson waits for appointment. If not eligible for clinical trial, will see her back to discuss next line therapy.   2. Intermittent fever spikes -secondary to tumor, chemo, or infection although clinically Morgan Dawson has no signs of infection -Tmax 100.6 on day 13 of last cycle, monitoring closely   3.Low weight -Due to chemo and underlying metastatic cancer. -stable lately  4. Transaminitis -Due to underlying chemo and malignancy -ALK PHOS elevation remains mild, stable   5. Pancytopenia, secondary to chemo -Anemia has improved, thrombocytopenia resolvedon promacta -neutropenia controlled with granix after day 14 Xeloda, Morgan Dawson responds well   6. Hypokalemia, CKD Stage III - on30 mEqKCL   7.Peripheral neuropathy, grade 2 -Currently on Neurontin 200 mg 2 per day and B complex -stable overall, mostly in her feet -refilled today  8.H/o Acute cholecystitis, status post cholecystectomy tube placement 4/23per IR - surgery was not recommended due to high risk of complication, and concerns for cancer progression when holding chemotherapy for surgery. Drainwas placed during hospitalizatio -CT CAP from 12/14/18-numerus gallstones. -Drain fell out on 5/18;imaging in  12/2018 consistent with cholecystitis with cystic duct obstruction. No leak on HIDA. Holding intervention given Morgan Dawson is asymptomatic and invasive procedure would delay chemo. -[place] on 6/5 was done due to recurrent fever after resuming chemo; no indication for gallbladder drainage at this time -given prophylactic antibiotics periodically due to fever spikes during chemo -no evidence of recurrent infection  9. LE DVT -Diagnosed on December 15, 2018, when Morgan Dawson was hospitalized for cholecystitis -Due to thrombocytopenia from chemotherapy, Morgan Dawson is  on low-dosexarelto62m daily (no loading dose) -tolerates well  10.Goal of care discussion -treatment goal is palliative    Disposition:  Ms. HWashingtonappears stable. Morgan Dawson completed 10 cycles of GTX, Morgan Dawson tolerates well overall. Labs stable and adequate for treatment. Morgan Dawson started this cycle on 11/06/19, one day later due to CBriellevaccine. Morgan Dawson will proceed with gemcitabine and taxotere today. Morgan Dawson will return for day 10 infusion next week, and granix on 3/29 after Morgan Dawson completes 14 days of Xeloda. Morgan Dawson will stop promacta one week after Morgan Dawson completes Xeloda.   [facility].   [place] Morgan Dawson requested a temporary handicap placard to accommodate closer parking on her trip. I completed and returned the form to Morgan Dawson today.   F/u open, after consult with [facility].   All questions were answered. The patient knows to call the clinic with any problems, questions or concerns. No barriers to learning were detected.     LAlla Feeling NP 11/08/19

## 2019-11-08 ENCOUNTER — Inpatient Hospital Stay: Payer: BLUE CROSS/BLUE SHIELD

## 2019-11-08 ENCOUNTER — Other Ambulatory Visit: Payer: Self-pay

## 2019-11-08 ENCOUNTER — Inpatient Hospital Stay (HOSPITAL_BASED_OUTPATIENT_CLINIC_OR_DEPARTMENT_OTHER): Payer: BLUE CROSS/BLUE SHIELD | Admitting: Nurse Practitioner

## 2019-11-08 ENCOUNTER — Encounter: Payer: Self-pay | Admitting: Nurse Practitioner

## 2019-11-08 VITALS — BP 105/67 | HR 88 | Temp 98.2°F | Resp 16 | Ht 65.0 in | Wt 107.3 lb

## 2019-11-08 DIAGNOSIS — C787 Secondary malignant neoplasm of liver and intrahepatic bile duct: Secondary | ICD-10-CM

## 2019-11-08 DIAGNOSIS — Z95828 Presence of other vascular implants and grafts: Secondary | ICD-10-CM

## 2019-11-08 DIAGNOSIS — C251 Malignant neoplasm of body of pancreas: Secondary | ICD-10-CM | POA: Diagnosis not present

## 2019-11-08 DIAGNOSIS — C259 Malignant neoplasm of pancreas, unspecified: Secondary | ICD-10-CM

## 2019-11-08 DIAGNOSIS — Z7189 Other specified counseling: Secondary | ICD-10-CM

## 2019-11-08 LAB — CBC WITH DIFFERENTIAL/PLATELET
Abs Immature Granulocytes: 0.08 10*3/uL — ABNORMAL HIGH (ref 0.00–0.07)
Basophils Absolute: 0.1 10*3/uL (ref 0.0–0.1)
Basophils Relative: 1 %
Eosinophils Absolute: 0.1 10*3/uL (ref 0.0–0.5)
Eosinophils Relative: 2 %
HCT: 28.4 % — ABNORMAL LOW (ref 36.0–46.0)
Hemoglobin: 9 g/dL — ABNORMAL LOW (ref 12.0–15.0)
Immature Granulocytes: 2 %
Lymphocytes Relative: 5 %
Lymphs Abs: 0.3 10*3/uL — ABNORMAL LOW (ref 0.7–4.0)
MCH: 34.5 pg — ABNORMAL HIGH (ref 26.0–34.0)
MCHC: 31.7 g/dL (ref 30.0–36.0)
MCV: 108.8 fL — ABNORMAL HIGH (ref 80.0–100.0)
Monocytes Absolute: 0.9 10*3/uL (ref 0.1–1.0)
Monocytes Relative: 17 %
Neutro Abs: 3.8 10*3/uL (ref 1.7–7.7)
Neutrophils Relative %: 73 %
Platelets: 350 10*3/uL (ref 150–400)
RBC: 2.61 MIL/uL — ABNORMAL LOW (ref 3.87–5.11)
RDW: 17.7 % — ABNORMAL HIGH (ref 11.5–15.5)
WBC: 5.2 10*3/uL (ref 4.0–10.5)
nRBC: 0 % (ref 0.0–0.2)

## 2019-11-08 LAB — COMPREHENSIVE METABOLIC PANEL
ALT: 20 U/L (ref 0–44)
AST: 40 U/L (ref 15–41)
Albumin: 3.6 g/dL (ref 3.5–5.0)
Alkaline Phosphatase: 161 U/L — ABNORMAL HIGH (ref 38–126)
Anion gap: 12 (ref 5–15)
BUN: 15 mg/dL (ref 6–20)
CO2: 24 mmol/L (ref 22–32)
Calcium: 8.9 mg/dL (ref 8.9–10.3)
Chloride: 104 mmol/L (ref 98–111)
Creatinine, Ser: 1.06 mg/dL — ABNORMAL HIGH (ref 0.44–1.00)
GFR calc Af Amer: >60 mL/min (ref >60)
GFR calc non Af Amer: 59 mL/min — ABNORMAL LOW (ref >60)
Glucose, Bld: 140 mg/dL — ABNORMAL HIGH (ref 70–99)
Potassium: 3.6 mmol/L (ref 3.5–5.1)
Sodium: 140 mmol/L (ref 135–145)
Total Bilirubin: 0.8 mg/dL (ref 0.3–1.2)
Total Protein: 6.6 g/dL (ref 6.5–8.1)

## 2019-11-08 MED ORDER — SODIUM CHLORIDE 0.9 % IV SOLN
600.0000 mg/m2 | Freq: Once | INTRAVENOUS | Status: AC
  Administered 2019-11-08: 912 mg via INTRAVENOUS
  Filled 2019-11-08: qty 23.99

## 2019-11-08 MED ORDER — DEXAMETHASONE SODIUM PHOSPHATE 10 MG/ML IJ SOLN
10.0000 mg | Freq: Once | INTRAMUSCULAR | Status: AC
  Administered 2019-11-08: 10 mg via INTRAVENOUS

## 2019-11-08 MED ORDER — SODIUM CHLORIDE 0.9% FLUSH
10.0000 mL | Freq: Once | INTRAVENOUS | Status: AC
  Administered 2019-11-08: 08:00:00 10 mL
  Filled 2019-11-08: qty 10

## 2019-11-08 MED ORDER — SODIUM CHLORIDE 0.9% FLUSH
10.0000 mL | INTRAVENOUS | Status: DC | PRN
  Administered 2019-11-08: 10 mL
  Filled 2019-11-08: qty 10

## 2019-11-08 MED ORDER — SODIUM CHLORIDE 0.9 % IV SOLN
30.0000 mg/m2 | Freq: Once | INTRAVENOUS | Status: AC
  Administered 2019-11-08: 40 mg via INTRAVENOUS
  Filled 2019-11-08: qty 4

## 2019-11-08 MED ORDER — DEXAMETHASONE SODIUM PHOSPHATE 10 MG/ML IJ SOLN
INTRAMUSCULAR | Status: AC
  Filled 2019-11-08: qty 1

## 2019-11-08 MED ORDER — GABAPENTIN 100 MG PO CAPS: 100.0000 mg | ORAL_CAPSULE | Freq: Three times a day (TID) | ORAL | 2 refills | Status: AC

## 2019-11-08 MED ORDER — SODIUM CHLORIDE 0.9 % IV SOLN
Freq: Once | INTRAVENOUS | Status: AC
  Filled 2019-11-08: qty 250

## 2019-11-08 MED ORDER — HEPARIN SOD (PORK) LOCK FLUSH 100 UNIT/ML IV SOLN
500.0000 [IU] | Freq: Once | INTRAVENOUS | Status: AC | PRN
  Administered 2019-11-08: 500 [IU]
  Filled 2019-11-08: qty 5

## 2019-11-08 NOTE — Patient Instructions (Signed)
[facility] Discharge Instructions for Patients Receiving Chemotherapy  Today you received the following chemotherapy agents Gemzar; Taxotere  To help prevent nausea and vomiting after your treatment, we encourage you to take your nausea medication as directed  If you develop nausea and vomiting that is not controlled by your nausea medication, call the clinic.   BELOW ARE SYMPTOMS THAT SHOULD BE REPORTED IMMEDIATELY:  *FEVER GREATER THAN 100.5 F  *CHILLS WITH OR WITHOUT FEVER  NAUSEA AND VOMITING THAT IS NOT CONTROLLED WITH YOUR NAUSEA MEDICATION  *UNUSUAL SHORTNESS OF BREATH  *UNUSUAL BRUISING OR BLEEDING  TENDERNESS IN MOUTH AND THROAT WITH OR WITHOUT PRESENCE OF ULCERS  *URINARY PROBLEMS  *BOWEL PROBLEMS  UNUSUAL RASH Items with * indicate a potential emergency and should be followed up as soon as possible.  Feel free to call the clinic should you have any questions or concerns. The clinic phone number is [phone].  Please show the CHEMO ALERT CARD at check-in to the Emergency Department and triage nurse.   

## 2019-11-09 LAB — CANCER ANTIGEN 19-9: CA 19-9: 82 U/mL — ABNORMAL HIGH (ref 0–35)

## 2019-11-14 NOTE — Progress Notes (Signed)
Pharmacist Chemotherapy Monitoring - Follow Up Assessment    I verify that I have reviewed each item in the below checklist:  . Regimen for the patient is scheduled for the appropriate day and plan matches scheduled date. Marland Kitchen Appropriate non-routine labs are ordered dependent on drug ordered. . If applicable, additional medications reviewed and ordered per protocol based on lifetime cumulative doses and/or treatment regimen.   Plan for follow-up and/or issues identified: Yes . I-vent associated with next due treatment: Yes, f/u auth for Zarxio . MD and/or nursing notified: No  Acquanetta Belling 11/14/2019 3:58 PM

## 2019-11-15 ENCOUNTER — Inpatient Hospital Stay: Payer: BLUE CROSS/BLUE SHIELD

## 2019-11-15 ENCOUNTER — Other Ambulatory Visit: Payer: Self-pay

## 2019-11-15 VITALS — BP 113/73 | HR 91 | Temp 98.5°F | Resp 17

## 2019-11-15 DIAGNOSIS — C787 Secondary malignant neoplasm of liver and intrahepatic bile duct: Secondary | ICD-10-CM | POA: Diagnosis not present

## 2019-11-15 DIAGNOSIS — Z7189 Other specified counseling: Secondary | ICD-10-CM

## 2019-11-15 DIAGNOSIS — C251 Malignant neoplasm of body of pancreas: Secondary | ICD-10-CM

## 2019-11-15 DIAGNOSIS — C259 Malignant neoplasm of pancreas, unspecified: Secondary | ICD-10-CM

## 2019-11-15 LAB — COMPREHENSIVE METABOLIC PANEL
ALT: 28 U/L (ref 0–44)
AST: 49 U/L — ABNORMAL HIGH (ref 15–41)
Albumin: 3.6 g/dL (ref 3.5–5.0)
Alkaline Phosphatase: 218 U/L — ABNORMAL HIGH (ref 38–126)
Anion gap: 9 (ref 5–15)
BUN: 19 mg/dL (ref 6–20)
CO2: 26 mmol/L (ref 22–32)
Calcium: 9.4 mg/dL (ref 8.9–10.3)
Chloride: 104 mmol/L (ref 98–111)
Creatinine, Ser: 0.93 mg/dL (ref 0.44–1.00)
GFR calc Af Amer: >60 mL/min (ref >60)
GFR calc non Af Amer: >60 mL/min (ref >60)
Glucose, Bld: 121 mg/dL — ABNORMAL HIGH (ref 70–99)
Potassium: 3.7 mmol/L (ref 3.5–5.1)
Sodium: 139 mmol/L (ref 135–145)
Total Bilirubin: 0.9 mg/dL (ref 0.3–1.2)
Total Protein: 6.9 g/dL (ref 6.5–8.1)

## 2019-11-15 LAB — CBC WITH DIFFERENTIAL/PLATELET
Abs Immature Granulocytes: 0.01 10*3/uL (ref 0.00–0.07)
Basophils Absolute: 0 10*3/uL (ref 0.0–0.1)
Basophils Relative: 1 %
Eosinophils Absolute: 0 10*3/uL (ref 0.0–0.5)
Eosinophils Relative: 1 %
HCT: 26.4 % — ABNORMAL LOW (ref 36.0–46.0)
Hemoglobin: 8.4 g/dL — ABNORMAL LOW (ref 12.0–15.0)
Immature Granulocytes: 0 %
Lymphocytes Relative: 7 %
Lymphs Abs: 0.2 10*3/uL — ABNORMAL LOW (ref 0.7–4.0)
MCH: 34.3 pg — ABNORMAL HIGH (ref 26.0–34.0)
MCHC: 31.8 g/dL (ref 30.0–36.0)
MCV: 107.8 fL — ABNORMAL HIGH (ref 80.0–100.0)
Monocytes Absolute: 0.3 10*3/uL (ref 0.1–1.0)
Monocytes Relative: 13 %
Neutro Abs: 1.9 10*3/uL (ref 1.7–7.7)
Neutrophils Relative %: 78 %
Platelets: 217 10*3/uL (ref 150–400)
RBC: 2.45 MIL/uL — ABNORMAL LOW (ref 3.87–5.11)
RDW: 16.8 % — ABNORMAL HIGH (ref 11.5–15.5)
WBC: 2.4 10*3/uL — ABNORMAL LOW (ref 4.0–10.5)
nRBC: 0 % (ref 0.0–0.2)

## 2019-11-15 MED ORDER — SODIUM CHLORIDE 0.9% FLUSH
10.0000 mL | INTRAVENOUS | Status: DC | PRN
  Administered 2019-11-15: 10 mL
  Filled 2019-11-15: qty 10

## 2019-11-15 MED ORDER — DEXAMETHASONE SODIUM PHOSPHATE 10 MG/ML IJ SOLN
INTRAMUSCULAR | Status: AC
  Filled 2019-11-15: qty 1

## 2019-11-15 MED ORDER — SODIUM CHLORIDE 0.9 % IV SOLN
600.0000 mg/m2 | Freq: Once | INTRAVENOUS | Status: AC
  Administered 2019-11-15: 912 mg via INTRAVENOUS
  Filled 2019-11-15: qty 23.99

## 2019-11-15 MED ORDER — SODIUM CHLORIDE 0.9 % IV SOLN
Freq: Once | INTRAVENOUS | Status: AC
  Filled 2019-11-15: qty 250

## 2019-11-15 MED ORDER — SODIUM CHLORIDE 0.9 % IV SOLN: 10.0000 mg | Freq: Once | INTRAVENOUS | Status: DC

## 2019-11-15 MED ORDER — HEPARIN SOD (PORK) LOCK FLUSH 100 UNIT/ML IV SOLN
500.0000 [IU] | Freq: Once | INTRAVENOUS | Status: AC | PRN
  Administered 2019-11-15: 500 [IU]
  Filled 2019-11-15: qty 5

## 2019-11-15 MED ORDER — SODIUM CHLORIDE 0.9 % IV SOLN
30.0000 mg/m2 | Freq: Once | INTRAVENOUS | Status: AC
  Administered 2019-11-15: 13:00:00 40 mg via INTRAVENOUS
  Filled 2019-11-15: qty 4

## 2019-11-15 MED ORDER — SODIUM CHLORIDE 0.9 % IV SOLN
Freq: Once | INTRAVENOUS | Status: DC
  Filled 2019-11-15: qty 250

## 2019-11-15 MED ORDER — DEXAMETHASONE SODIUM PHOSPHATE 10 MG/ML IJ SOLN
10.0000 mg | Freq: Once | INTRAMUSCULAR | Status: AC
  Administered 2019-11-15: 11:00:00 10 mg via INTRAVENOUS

## 2019-11-15 NOTE — Patient Instructions (Signed)
[facility] Discharge Instructions for Patients Receiving Chemotherapy  Today you received the following chemotherapy agents Gemzar,Taxotere To help prevent nausea and vomiting after your treatment, we encourage you to take your nausea medication as directed  If you develop nausea and vomiting that is not controlled by your nausea medication, call the clinic.   BELOW ARE SYMPTOMS THAT SHOULD BE REPORTED IMMEDIATELY:  *FEVER GREATER THAN 100.5 F  *CHILLS WITH OR WITHOUT FEVER  NAUSEA AND VOMITING THAT IS NOT CONTROLLED WITH YOUR NAUSEA MEDICATION  *UNUSUAL SHORTNESS OF BREATH  *UNUSUAL BRUISING OR BLEEDING  TENDERNESS IN MOUTH AND THROAT WITH OR WITHOUT PRESENCE OF ULCERS  *URINARY PROBLEMS  *BOWEL PROBLEMS  UNUSUAL RASH Items with * indicate a potential emergency and should be followed up as soon as possible.  Feel free to call the clinic should you have any questions or concerns. The clinic phone number is [phone].  Please show the CHEMO ALERT CARD at check-in to the Emergency Department and triage nurse.   

## 2019-11-17 ENCOUNTER — Telehealth: Payer: Self-pay

## 2019-11-17 NOTE — Telephone Encounter (Signed)
Morgan Dawson left vm wanting to know what treatment is planned while she awaits enrollment in a clinical trial.

## 2019-11-17 NOTE — Telephone Encounter (Signed)
I called [facility] Headings, and reviewed her consult note from Dr. Altamease Oiler from [facility]. I will schedule her two more cycle chemo GMOX while she is waiting for clinical trial, around her trip 4/17-4/27. She agrees.   [name]  11/17/2019

## 2019-11-20 ENCOUNTER — Telehealth: Payer: Self-pay | Admitting: Hematology

## 2019-11-20 ENCOUNTER — Inpatient Hospital Stay: Payer: BLUE CROSS/BLUE SHIELD

## 2019-11-20 ENCOUNTER — Other Ambulatory Visit: Payer: Self-pay

## 2019-11-20 VITALS — BP 118/72 | HR 88 | Temp 98.2°F | Resp 18

## 2019-11-20 DIAGNOSIS — C259 Malignant neoplasm of pancreas, unspecified: Secondary | ICD-10-CM

## 2019-11-20 DIAGNOSIS — C787 Secondary malignant neoplasm of liver and intrahepatic bile duct: Secondary | ICD-10-CM | POA: Diagnosis not present

## 2019-11-20 DIAGNOSIS — Z95828 Presence of other vascular implants and grafts: Secondary | ICD-10-CM

## 2019-11-20 MED ORDER — FILGRASTIM-SNDZ 300 MCG/0.5ML IJ SOSY
PREFILLED_SYRINGE | INTRAMUSCULAR | Status: AC
  Filled 2019-11-20: qty 0.5

## 2019-11-20 MED ORDER — FILGRASTIM-SNDZ 300 MCG/0.5ML IJ SOSY
300.0000 ug | PREFILLED_SYRINGE | Freq: Once | INTRAMUSCULAR | Status: AC
  Administered 2019-11-20: 300 ug via SUBCUTANEOUS

## 2019-11-20 NOTE — Patient Instructions (Signed)

## 2019-11-20 NOTE — Telephone Encounter (Signed)
Scheduled appt per 3/26 sch message - unable to reach pt . Left message with appt date and time

## 2019-11-23 NOTE — Progress Notes (Signed)

## 2019-11-28 NOTE — Progress Notes (Signed)
Morgan Dawson   [phone] [phone]   Clinic Follow up Note   Patient Care Team: [name], MD as PCP - General (Family Medicine) [name], MD as Consulting Physician (Otolaryngology) [name], MD as Consulting Physician (Oncology) 11/29/2019  CHIEF COMPLAINT: F/u pancreas cancer   SUMMARY OF ONCOLOGIC HISTORY: Oncology History Overview Note  Cancer Staging Pancreatic cancer [facility]) Staging form: Exocrine Pancreas, AJCC 8th Edition - Clinical stage from 08/06/2017: Stage IV (cTX, cN0, pM1) - Signed by [name], MD on 08/12/2017     Pancreatic cancer ([place])  07/23/2017 Imaging   US abdomen limited RUQ 07/23/17 IMPRESSION: 1. Cholelithiasis.  No secondary signs of acute cholecystitis. 2. Multiple solid liver masses measuring up to 6.5 cm in the left lobe of the liver, evaluation for metastatic disease is recommended. These results will be called to the ordering clinician or representative by the Radiologist Assistant, and communication documented in the PACS or zVision Dashboard.   07/23/2017 Imaging   CT Abdomen W Contrast 07/23/17 IMPRESSION: 1. Widespread metastatic disease throughout the liver. No clear primary malignancy identified in the abdomen. The pelvis was not imaged. Tissue sampling recommended. 2. Probable adenopathy superior to the pancreatic tail. No evidence of pancreatic mass. 3. Suspected incidental hemangioma inferiorly in the right hepatic lobe. 4. Nonspecific nodularity in the breasts. The patient has undergone recent (03/25/2017 and 04/01/2017) mammography and ultrasound.   07/29/2017 Initial Diagnosis   Metastasis to liver of unknown origin ([name])   07/29/2017 PET scan   PET 07/29/17  IMPRESSION: 1. Numerous bulky liver masses are hypermetabolic compatible with malignancy. 2. Accentuated activity within or along the pancreatic tail, likely represent a primary pancreatic tumor. Consider pancreatic  protocol MRI to further work this up. 3. The peripancreatic lymph node shown above the pancreatic tail is mildly hypermetabolic favoring malignancy. 4.  Prominent stool throughout the colon favors constipation. 5. 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   08/05/2017 Pathology Results   Liver Biopsy  Diagnosis 08/05/17 Liver, needle/core biopsy - CARCINOMA. - SEE COMMENT. Microscopic Comment The malignant cells are positive for cytokeratin 7. They are negative for arginase, CDX2, cytokeratin 20, estrogen receptor, GATA-3, GCDFP, Glypican 3, Hep Par 1, Napsin A, and TTF-1. This immunohistochemical is nonspecific. Possibly primary sources include pancreatobiliary and upper gastrointestinal. Radiologic correlation is necessary. Of note, organ specific markers (GATA-3, GCDFP-breast, TTF-1, Napsin A-lung, and CDX2-colon) are negative. (JBK:ecj 08/09/2017)   08/21/2017 - 02/10/2018 Chemotherapy   FOLFIRINOX every 2 weeks starting 08/21/17. Dose reduced due to neuropahty and cytopenia    10/14/2017 Imaging   CT CAP WO Contrast 10/14/17 IMPRESSION: Evidence of known numerous liver metastases without significant interval change. No other evidence of metastatic disease within the chest, abdomen or pelvis. Cholelithiasis. Tiny pericardial effusion.   12/02/2017 Genetic Testing   Negative for pathogenic mutation.  The genes analyzed were the 83 genes on [facility]'s Multi-Cancer panel (ALK, APC, ATM, AXIN2, BAP1, BARD1, BLM, BMPR1A, BRCA1, BRCA2, BRIP1, CASR, CDC73, CDH1, CDK4, CDKN1B, CDKN1C, CDKN2A, CEBPA, CHEK2, CTNNA1, DICER1, DIS3L2, EGFR, EPCAM, FH, FLCN, GATA2, GPC3, GREM1, HOXB13, HRAS, KIT, MAX, MEN1, MET, MITF, MLH1, MSH2, MSH3, MSH6, MUTYH, NBN, NF1, NF2, NTHL1, PALB2, PDGFRA, PHOX2B, PMS2, POLD1, POLE, POT1, PRKAR1A, PTCH1, PTEN, RAD50, RAD51C, RAD51D, RB1, RECQL4, RET, RUNX1, SDHA, SDHAF2, SDHB, SDHC, SDHD, SMAD4, SMARCA4, SMARCB1, SMARCE1, STK11, SUFU, TERC, TERT, TMEM127, TP53, TSC1, TSC2,  VHL, WRN, WT1).   12/17/2017 PET scan   IMPRESSION: 1. Although the liver metastases are still visible on the  CT images, they are significantly smaller and retain no abnormal metabolic activity, consistent with response to therapy. 2. No abnormal activity within the pancreas. 3. No disease progression identified. 4. Cholelithiasis.   02/21/2018 PET scan   02/21/2018 PET Scan  IMPRESSION: 1. There are 2 new small nodules identified within the right lung which measure up to 5 mm. These are too small to reliably characterize by PET-CT, but warrant close interval follow-up. 2. Again noted are multifocal liver metastasis. The target lesion within the left lobe is slightly decreased in size when compared with the previous exam. Similar to previous exam there is no abnormal hypermetabolism above background liver activity identified within the liver lesions. 3. Gallstones.   02/23/2018 - 09/04/2018 Chemotherapy   5-FU pump infusion and liposomal irinotecan, every 2 weeks.  First cycle dose reduced due to cytopenia in the travel. Stopped on 08/25/2018 due to disease progression.    05/17/2018 Imaging   05/17/2018 PET Scan IMPRESSION: 1. Mixed response. The 2 small right lung nodules have decreased in size in the interval. Single focus of increased uptake within the liver is new from 02/21/2018 and concerning for recurrent metabolically active liver metastasis. 2. Splenomegaly.  New from previous exam. 3. Diffusely increased bone marrow activity, likely reflecting treatment related changes.   09/01/2018 PET scan   PET 09/01/18 IMPRESSION: 1. Unfortunately there recurrence of hepatic metastasis with multiple new hypermetabolic lesions in LEFT and RIGHT hepatic lobe. 2. New extra hepatic site of malignancy which appears associated the mid pancreas. Difficult to define lesion on noncontrast exam. 3. Diffuse marrow activity is favored benign. 4. No evidence of pulmonary metastasis.   09/12/2018 -  03/27/2019 Chemotherapy   second-line Gemcitabine and Abraxane for 2 weeks on, 1 week off starting 09/12/2018. Due to neuropathy, I will start her with dose reduced Abraxane. Stopped after 03/27/19 due to disease progression.    12/14/2018 Imaging   CT CAP  IMPRESSION: CT demonstrates acute cholecystitis, with choledocholithiasis of the cystic duct.  These results were discussed by telephone at the time of interpretation on 12/14/2018 at 5:05 pm with Dr. Burr Medico.  Redemonstration of known liver metastases, with positive response to therapy, interval reduction in size of all lesions, with multiple demonstrating significant internal necrosis.  No acute finding of the chest.  Ancillary findings as above.   03/21/2019 Imaging   CT CAP W Contrast IMPRESSION: 1. Numerous rim enhancing liver metastases show overall trend towards mild progression although 1 of the index lesions is stable in the interval. 2. Interval development of portocaval lymphadenopathy concerning for disease progression. 3. Slight progression of main duct dilatation in the pancreas with abrupt cut off in the region of the body. 4. Interval development of a 9 mm subpleural nodule at the right base along the posterior hemidiaphragm. Close attention on follow-up recommended as metastatic disease not excluded. 5. New splenomegaly. 6. Cholelithiasis with diffuse gallbladder wall thickening. Gallbladder wall thickening is decreased in the interval. Stones and sludge noted in the gallbladder lumen.   04/09/2019 Imaging   CT AP W Contrast 04/09/19  IMPRESSION: 1. Multiple gallstones in lumen gallbladder without evidence acute cholecystitis. No significant change from prior. No biliary duct dilatation. Common bile duct normal. 2. Widespread hepatic metastasis unchanged from comparison CT. 3. Chronic pancreatic duct dilatation in the body and tail unchanged. 4. No bowel obstruction. 5. Small amount of intraperitoneal free  fluid unchanged.   04/10/2019 -  Chemotherapy   Third-line GTX with Xeloda 1083m BID day 1-14, off  for 1 week with Docetaxel and Gemcitbine on day 4 and day 11 every 3 weeks starting oral chemo on 04/10/19.    07/03/2019 Imaging   CT CAP W Contrast  IMPRESSION: 1. Overall stable diffuse hepatic metastatic disease. No new/progressive findings. 2. Stable to slightly larger periportal lymph node. 3. Stable CT appearance of the pancreas. Abrupt cut off of a dilated main pancreatic duct in the body/head junction region without obvious pancreatic lesion. 4. No pulmonary nodules are identified at the lung bases. 5. Stable mild splenomegaly. 6. No new/acute findings.     10/17/2019 Imaging   CT CAP W Contrast  IMPRESSION: 1. Overall mild progression of metastatic disease. Portacaval lymphadenopathy has increased. Enlarging lesion along the posterior right diaphragm is suspicious for growing right pleural metastasis. 2. Bulky liver metastatic disease is relatively stable with mild mixed changes as detailed. 3. Pancreatic body tumor is stable. 4. Moderate splenomegaly, increased. 5. Aortic Atherosclerosis (ICD10-I70.0). Additional chronic findings as detailed.       Pancreatic cancer metastasized to liver ([place])  08/11/2017 Initial Diagnosis   Pancreatic cancer metastasized to liver [facility])     CURRENT THERAPY:  Third-line GTX with Xeloda1032mBID day 1-14, off for 1 week with Docetaxel390mm2and Gemcitabine7509m2on day 4 and day 11 every 3 weeks starting oral chemo on 04/10/19.Starting 07/06/19 she will reduce Xeloda to 1000m32m the AM and 500mg65mthe PM due to low blood counts.  INTERVAL HISTORY: Morgan Dawson for f/u and treatment as scheduled. She was seen at [facility]/25/21 for clinical trial consideration. The recommendation is to continue current treatment until disease progression, she is being placed on the list for phase I/II pancreatic trials, anticipate slots  around June.  Today, she feels about the same. She started Xeloda on 4/4 PM. Hands are dry, not red or sensitive. Neuropathy is stable, warm weather helps. Denies current mucositis but mouth feels sore at times. Not limited po intake. Her appetite is lower and she has lost few pounds. Denies n/v. Continues to fluctuate between constipation and diarrhea but controlled. Denies overt abdominal pain but for past 7-10 days feels bloated, full quicker and uncomfortable at times; hasn't felt like this in a while. Denies recent fever, chills, cough, chest pain, dyspnea, leg edema. Has bruising on her forearms, denies bleeding. She is excited but [place]     MEDICAL HISTORY:  Past Medical History:  Diagnosis Date  . Diarrhea of presumed infectious origin 01/12/2018  . Gastroenteritis 01/12/2018  . Genetic testing 12/02/2017   Multi-Cancer panel (83 genes) @ [facility] - No pathogenic mutations detected  . Meniere disease 2016  . met pancreatic ca to liver dx'd 07/2017  . Pancreatitis     SURGICAL HISTORY: Past Surgical History:  Procedure Laterality Date  . CERVICAL ABLATION    . ENDOMETRIAL ABLATION  2011  . IR CHOLANGIOGRAM EXISTING TUBE  01/09/2019  . IR FLUORO GUIDE PORT INSERTION RIGHT  08/12/2017  . IR PERC CHOLECYSTOSTOMY  12/15/2018  . IR US [place] VASC ACCESS RIGHT  08/12/2017  . [place] have reviewed the social history and family history with the patient and they are unchanged from previous note.  ALLERGIES:  has No Known Allergies.  MEDICATIONS:  Current Outpatient Medications  Medication Sig Dispense Refill  . b complex vitamins tablet Take 1 tablet by mouth daily.    . capecitabine (XELODA) 500 MG tablet TAKE 2 TABLETS BY MOUTH  IN THE MORNING AND 1 TAB IN EVENING, EVERY 12 HOURS, WITHIN 30  MINUTES OF MEALS ON DAYS 1  TO 14 OF EACH 21 DAY CYCLE 56 tablet 0  . Cholecalciferol (VITAMIN D3 GUMMIES ADULT) 25 MCG  (1000 UT) CHEW Chew by mouth. Taking 2 gummies    . cyclobenzaprine (FLEXERIL) 5 MG tablet Take 1 tablet (5 mg total) by mouth 3 (three) times daily as needed for muscle spasms. 30 tablet 0  . eltrombopag (PROMACTA) 50 MG tablet Take 1 tablet (50 mg total) by mouth daily. Take on an empty stomach 1 hour before a meal or 2 hours after 30 tablet 2  . gabapentin (NEURONTIN) 100 MG capsule Take 1 capsule (100 mg total) by mouth 3 (three) times daily. 270 capsule 2  . lidocaine-prilocaine (EMLA) cream Apply 1 application topically as needed. 30 g 2  . loperamide (IMODIUM) 2 MG capsule Take 1 capsule (2 mg total) by mouth as needed for diarrhea or loose stools. 30 capsule 0  . loratadine (CLARITIN) 10 MG tablet Take 10 mg by mouth daily as needed for allergies.    . magic mouthwash w/lidocaine SOLN Take 10 mLs by mouth 3 (three) times daily as needed for mouth pain. Swish and spit 10 ML by mouth 3 times daily as needed for mouth pain. 240 mL 0  . meclizine (ANTIVERT) 25 MG tablet Take 25 mg by mouth 3 (three) times daily as needed for dizziness.    . ondansetron (ZOFRAN-ODT) 8 MG disintegrating tablet TAKE 1 TABLET BY MOUTH EVERY 8 HOURS AS NEEDED FOR NAUSE OR VOMITING 40 tablet 1  . potassium chloride (KLOR-CON) 10 MEQ tablet Take 1 tablet (10 mEq total) by mouth 4 (four) times daily. 360 tablet 2  . prochlorperazine (COMPAZINE) 10 MG tablet Take 1 tablet (10 mg total) by mouth every 6 (six) hours as needed for nausea or vomiting. 40 tablet 2  . Rivaroxaban (XARELTO) 15 MG TABS tablet TAKE 1 TABLET(15 MG) BY MOUTH DAILY 90 tablet 1  . traMADol (ULTRAM) 50 MG tablet Take 1 tablet (50 mg total) by mouth every 6 (six) hours as needed for moderate pain or severe pain. 30 tablet 0   No current facility-administered medications for this visit.   Facility-Administered Medications Ordered in Other Visits  Medication Dose Route Frequency Provider Last Rate Last Admin  . sodium chloride flush (NS) 0.9 %  injection 10 mL  10 mL Intracatheter Once [name], MD        PHYSICAL EXAMINATION: ECOG PERFORMANCE STATUS: 1 - Symptomatic but completely ambulatory  Vitals:   11/29/19 0906  BP: 104/72  Pulse: 90  Resp: 17  Temp: 98.5 F (36.9 C)  SpO2: 100%   Filed Weights   11/29/19 0906  Weight: 104 lb 11.2 oz (47.5 kg)    GENERAL:alert, no distress and comfortable SKIN: dry skin, no rash. Palms without erythema or breakdown EYES:  sclera clear OROPHARYNX: no thrush or ulcers NECK: without mass LUNGS: clear with normal breathing effort HEART: regular rate & rhythm, no lower extremity edema ABDOMEN: abdomen soft, non-tender and normal bowel sounds NEURO: alert & oriented x 3 with fluent speech, normal gait PAC without erythema   LABORATORY DATA:  I have reviewed the data as listed CBC Latest Ref Rng & Units 11/29/2019 11/15/2019 11/08/2019  WBC 4.0 - 10.5 K/uL 4.3 2.4(L) 5.2  Hemoglobin 12.0 - 15.0 g/dL 8.6(L) 8.4(L) 9.0(L)  Hematocrit 36.0 - 46.0 % 27.3(L) 26.4(L) 28.4(L)  Platelets 150 -  400 K/uL 322 217 350     CMP Latest Ref Rng & Units 11/29/2019 11/15/2019 11/08/2019  Glucose 70 - 99 mg/dL 151(H) 121(H) 140(H)  BUN 6 - 20 mg/dL 12 19 15   Creatinine 0.44 - 1.00 mg/dL 1.09(H) 0.93 1.06(H)  Sodium 135 - 145 mmol/L 140 139 140  Potassium 3.5 - 5.1 mmol/L 3.6 3.7 3.6  Chloride 98 - 111 mmol/L 105 104 104  CO2 22 - 32 mmol/L 24 26 24   Calcium 8.9 - 10.3 mg/dL 9.2 9.4 8.9  Total Protein 6.5 - 8.1 g/dL 6.8 6.9 6.6  Total Bilirubin 0.3 - 1.2 mg/dL 0.8 0.9 0.8  Alkaline Phos 38 - 126 U/L 189(H) 218(H) 161(H)  AST 15 - 41 U/L 43(H) 49(H) 40  ALT 0 - 44 U/L 20 28 20       RADIOGRAPHIC STUDIES: I have personally reviewed the radiological images as listed and agreed with the findings in the report. No results found.   ASSESSMENT & PLAN: ISATU MACINNES a 59 y.o.femalewith   1. 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 She was treated withfirst linechemoFOLFIRINOX with dose reduction. Due to neuropathyand cytopenia, chemo changed tosecond line 5-FU pump infusion and liposomal irinotecan every 2 weekswith dose reductionuntil she progressed in the liver.  -Shewas onsecond-line Gemcitabine and Abraxane2 weeks on/1 week offon 1/20/20until progression in the liver and new lung nodule. D/c'd 03/27/19 -she started third line GTX, Xeloda 1000 mg AM/500 mg PM days 1-14 and gem/taxotere on days 4 and 11, starting on 04/10/19, tolerating well. S/p 10 cycles  - CT CAP from 10/17/19 shows mild disease progression, CA 19-9 fluctuates, level is pending from today.  -She was [facility] at [facility] on 3/[facility], not currently enrolling but the team anticipates she will be eligible for phase I/II pancreatic trial ~June.  -We will continue GTX in the meantime, she tolerates well    2. Intermittent fever spikes -secondary to tumor, chemo, or infection although clinically she has no signs of infection -Tmax 100.6 on day 13 of last cycle, monitoring closely   3.Low weight -Due to chemo and underlying metastatic cancer.  4. Transaminitis -Due to underlying chemo and malignancy -ALK PHOS elevation remains mild, stable  5. Pancytopenia, secondary to chemo -Anemia has improved, thrombocytopenia resolvedon promacta -neutropenia controlled with granix after day 14 Xeloda, she responds well   6. Hypokalemia, CKD Stage III - on30 mEqKCL, continue   7.Peripheral neuropathy, grade 2 -Currently on Neurontin 200 mg 2 per day and B complex -stable overall, mostly in her feet; warm weather helps   8.H/o Acute cholecystitis, status post cholecystectomy tube placement 4/23per IR - surgery was not recommended due to high risk of complication, and concerns for cancer progression when holding chemotherapy for surgery. Drainwas placed during hospitalizatio -CT CAP from 12/14/18-numerus gallstones. -Drain fell out on  5/18;imaging in 12/2018 consistent with cholecystitis with cystic duct obstruction. No leak on HIDA. Holding intervention given she is asymptomatic and invasive procedure would delay chemo. -[place] on 6/5 was done due to recurrent fever after resuming chemo; no indication for gallbladder drainage at this time -given prophylactic antibiotics periodically due to fever spikes during chemo -noevidence of recurrent infection  9. LE DVT -Diagnosed on December 15, 2018, when she was hospitalized for cholecystitis -Due to thrombocytopenia from chemotherapy, she is on low-dosexarelto23m daily (no loading dose) -tolerates well with some bruising but no bleeding  10.Goal of care discussion -treatment goal is palliative  Disposition:  Morgan Dawson appears stable. She began another cycle of Xeloda on 11/26/19. She is tolerating well thus far. Labs reviewed. Hgb 8.6, ANC and PLT normal. CMP is stable. she has lower appetite and few pounds weight loss since last visit. We reviewed appetite stimulants including mirtazapine. She prefers to hold while she goes on vacation 4/17 - 27, will reconsider after her trip. I encouraged her to resume boost/ensure. Overall adequate for cycle 12 day 4 gemcitabine and taxotere today.   Due to her upcoming cross country trip, she will stop Xeloda after day 10 on 4/13. She will return 4/15 for lab and possible RBC transfusion if she has worsening anemia. She is scheduled for Granix that day. She will be in [place] from 4/17 - 4/27. We reviewed infection precautions and symptom management. She will call or use Mychart if she has questions or concerns while she is away.   She will begin next cycle Xeloda on 4/26, she will return for f/u and infusion on 4/29. We will keep an open line of communication with [facility] regarding clinical trial enrollment.    All questions were answered. The patient knows to call the clinic with any problems, questions or concerns. No barriers to learning were  detected.     Alla Feeling, NP 11/29/19

## 2019-11-29 ENCOUNTER — Inpatient Hospital Stay: Payer: BLUE CROSS/BLUE SHIELD | Attending: Hematology

## 2019-11-29 ENCOUNTER — Other Ambulatory Visit: Payer: Self-pay | Admitting: Hematology

## 2019-11-29 ENCOUNTER — Inpatient Hospital Stay: Payer: BLUE CROSS/BLUE SHIELD

## 2019-11-29 ENCOUNTER — Other Ambulatory Visit: Payer: Self-pay

## 2019-11-29 ENCOUNTER — Inpatient Hospital Stay (HOSPITAL_BASED_OUTPATIENT_CLINIC_OR_DEPARTMENT_OTHER): Payer: BLUE CROSS/BLUE SHIELD | Admitting: Nurse Practitioner

## 2019-11-29 ENCOUNTER — Encounter: Payer: Self-pay | Admitting: Nurse Practitioner

## 2019-11-29 VITALS — BP 104/72 | HR 90 | Temp 98.5°F | Resp 17 | Ht 65.0 in | Wt 104.7 lb

## 2019-11-29 DIAGNOSIS — C259 Malignant neoplasm of pancreas, unspecified: Secondary | ICD-10-CM

## 2019-11-29 DIAGNOSIS — C787 Secondary malignant neoplasm of liver and intrahepatic bile duct: Secondary | ICD-10-CM | POA: Diagnosis present

## 2019-11-29 DIAGNOSIS — Z5111 Encounter for antineoplastic chemotherapy: Secondary | ICD-10-CM | POA: Insufficient documentation

## 2019-11-29 DIAGNOSIS — Z95828 Presence of other vascular implants and grafts: Secondary | ICD-10-CM

## 2019-11-29 DIAGNOSIS — Z7189 Other specified counseling: Secondary | ICD-10-CM

## 2019-11-29 DIAGNOSIS — C251 Malignant neoplasm of body of pancreas: Secondary | ICD-10-CM

## 2019-11-29 DIAGNOSIS — D649 Anemia, unspecified: Secondary | ICD-10-CM | POA: Insufficient documentation

## 2019-11-29 LAB — COMPREHENSIVE METABOLIC PANEL
ALT: 20 U/L (ref 0–44)
AST: 43 U/L — ABNORMAL HIGH (ref 15–41)
Albumin: 3.6 g/dL (ref 3.5–5.0)
Alkaline Phosphatase: 189 U/L — ABNORMAL HIGH (ref 38–126)
Anion gap: 11 (ref 5–15)
BUN: 12 mg/dL (ref 6–20)
CO2: 24 mmol/L (ref 22–32)
Calcium: 9.2 mg/dL (ref 8.9–10.3)
Chloride: 105 mmol/L (ref 98–111)
Creatinine, Ser: 1.09 mg/dL — ABNORMAL HIGH (ref 0.44–1.00)
GFR calc Af Amer: >60 mL/min (ref >60)
GFR calc non Af Amer: 57 mL/min — ABNORMAL LOW (ref >60)
Glucose, Bld: 151 mg/dL — ABNORMAL HIGH (ref 70–99)
Potassium: 3.6 mmol/L (ref 3.5–5.1)
Sodium: 140 mmol/L (ref 135–145)
Total Bilirubin: 0.8 mg/dL (ref 0.3–1.2)
Total Protein: 6.8 g/dL (ref 6.5–8.1)

## 2019-11-29 LAB — CBC WITH DIFFERENTIAL/PLATELET
Abs Immature Granulocytes: 0.03 10*3/uL (ref 0.00–0.07)
Basophils Absolute: 0 10*3/uL (ref 0.0–0.1)
Basophils Relative: 1 %
Eosinophils Absolute: 0 10*3/uL (ref 0.0–0.5)
Eosinophils Relative: 1 %
HCT: 27.3 % — ABNORMAL LOW (ref 36.0–46.0)
Hemoglobin: 8.6 g/dL — ABNORMAL LOW (ref 12.0–15.0)
Immature Granulocytes: 1 %
Lymphocytes Relative: 6 %
Lymphs Abs: 0.3 10*3/uL — ABNORMAL LOW (ref 0.7–4.0)
MCH: 33.1 pg (ref 26.0–34.0)
MCHC: 31.5 g/dL (ref 30.0–36.0)
MCV: 105 fL — ABNORMAL HIGH (ref 80.0–100.0)
Monocytes Absolute: 0.7 10*3/uL (ref 0.1–1.0)
Monocytes Relative: 16 %
Neutro Abs: 3.2 10*3/uL (ref 1.7–7.7)
Neutrophils Relative %: 75 %
Platelets: 322 10*3/uL (ref 150–400)
RBC: 2.6 MIL/uL — ABNORMAL LOW (ref 3.87–5.11)
RDW: 17.9 % — ABNORMAL HIGH (ref 11.5–15.5)
WBC: 4.3 10*3/uL (ref 4.0–10.5)
nRBC: 0 % (ref 0.0–0.2)

## 2019-11-29 MED ORDER — SODIUM CHLORIDE 0.9 % IV SOLN
30.0000 mg/m2 | Freq: Once | INTRAVENOUS | Status: AC
  Administered 2019-11-29: 12:00:00 40 mg via INTRAVENOUS
  Filled 2019-11-29: qty 4

## 2019-11-29 MED ORDER — SODIUM CHLORIDE 0.9 % IV SOLN
Freq: Once | INTRAVENOUS | Status: AC
  Filled 2019-11-29: qty 250

## 2019-11-29 MED ORDER — SODIUM CHLORIDE 0.9% FLUSH
10.0000 mL | INTRAVENOUS | Status: DC | PRN
  Administered 2019-11-29: 14:00:00 10 mL
  Filled 2019-11-29: qty 10

## 2019-11-29 MED ORDER — SODIUM CHLORIDE 0.9% FLUSH
10.0000 mL | Freq: Once | INTRAVENOUS | Status: AC
  Administered 2019-11-29: 10 mL
  Filled 2019-11-29: qty 10

## 2019-11-29 MED ORDER — HEPARIN SOD (PORK) LOCK FLUSH 100 UNIT/ML IV SOLN
500.0000 [IU] | Freq: Once | INTRAVENOUS | Status: AC | PRN
  Administered 2019-11-29: 500 [IU]
  Filled 2019-11-29: qty 5

## 2019-11-29 MED ORDER — SODIUM CHLORIDE 0.9 % IV SOLN
600.0000 mg/m2 | Freq: Once | INTRAVENOUS | Status: AC
  Administered 2019-11-29: 11:00:00 912 mg via INTRAVENOUS
  Filled 2019-11-29: qty 23.99

## 2019-11-29 MED ORDER — SODIUM CHLORIDE 0.9 % IV SOLN
10.0000 mg | Freq: Once | INTRAVENOUS | Status: AC
  Administered 2019-11-29: 10 mg via INTRAVENOUS
  Filled 2019-11-29: qty 10

## 2019-11-30 ENCOUNTER — Other Ambulatory Visit: Payer: Self-pay | Admitting: Hematology

## 2019-11-30 ENCOUNTER — Telehealth: Payer: Self-pay | Admitting: Nurse Practitioner

## 2019-11-30 NOTE — Telephone Encounter (Signed)
Scheduled appt per 4/7 los.  Spoke with pt and she is aware of her scheduled appt date and time.

## 2019-12-07 ENCOUNTER — Inpatient Hospital Stay: Payer: BLUE CROSS/BLUE SHIELD

## 2019-12-07 ENCOUNTER — Ambulatory Visit: Payer: BLUE CROSS/BLUE SHIELD

## 2019-12-07 ENCOUNTER — Other Ambulatory Visit: Payer: Self-pay

## 2019-12-07 ENCOUNTER — Other Ambulatory Visit: Payer: Self-pay | Admitting: Nurse Practitioner

## 2019-12-07 DIAGNOSIS — C251 Malignant neoplasm of body of pancreas: Secondary | ICD-10-CM

## 2019-12-07 DIAGNOSIS — C259 Malignant neoplasm of pancreas, unspecified: Secondary | ICD-10-CM

## 2019-12-07 DIAGNOSIS — D6481 Anemia due to antineoplastic chemotherapy: Secondary | ICD-10-CM

## 2019-12-07 DIAGNOSIS — T451X5A Adverse effect of antineoplastic and immunosuppressive drugs, initial encounter: Secondary | ICD-10-CM

## 2019-12-07 DIAGNOSIS — C787 Secondary malignant neoplasm of liver and intrahepatic bile duct: Secondary | ICD-10-CM

## 2019-12-07 DIAGNOSIS — Z95828 Presence of other vascular implants and grafts: Secondary | ICD-10-CM

## 2019-12-07 LAB — CBC WITH DIFFERENTIAL/PLATELET
Abs Immature Granulocytes: 0.03 10*3/uL (ref 0.00–0.07)
Basophils Absolute: 0 10*3/uL (ref 0.0–0.1)
Basophils Relative: 1 %
Eosinophils Absolute: 0 10*3/uL (ref 0.0–0.5)
Eosinophils Relative: 1 %
HCT: 25.6 % — ABNORMAL LOW (ref 36.0–46.0)
Hemoglobin: 8.1 g/dL — ABNORMAL LOW (ref 12.0–15.0)
Immature Granulocytes: 1 %
Lymphocytes Relative: 7 %
Lymphs Abs: 0.3 10*3/uL — ABNORMAL LOW (ref 0.7–4.0)
MCH: 33.9 pg (ref 26.0–34.0)
MCHC: 31.6 g/dL (ref 30.0–36.0)
MCV: 107.1 fL — ABNORMAL HIGH (ref 80.0–100.0)
Monocytes Absolute: 0.5 10*3/uL (ref 0.1–1.0)
Monocytes Relative: 12 %
Neutro Abs: 3.6 10*3/uL (ref 1.7–7.7)
Neutrophils Relative %: 78 %
Platelets: 196 10*3/uL (ref 150–400)
RBC: 2.39 MIL/uL — ABNORMAL LOW (ref 3.87–5.11)
RDW: 17.2 % — ABNORMAL HIGH (ref 11.5–15.5)
WBC: 4.4 10*3/uL (ref 4.0–10.5)
nRBC: 0 % (ref 0.0–0.2)

## 2019-12-07 LAB — PREPARE RBC (CROSSMATCH)

## 2019-12-07 LAB — COMPREHENSIVE METABOLIC PANEL
ALT: 29 U/L (ref 0–44)
AST: 45 U/L — ABNORMAL HIGH (ref 15–41)
Albumin: 3.3 g/dL — ABNORMAL LOW (ref 3.5–5.0)
Alkaline Phosphatase: 212 U/L — ABNORMAL HIGH (ref 38–126)
Anion gap: 8 (ref 5–15)
BUN: 13 mg/dL (ref 6–20)
CO2: 26 mmol/L (ref 22–32)
Calcium: 9 mg/dL (ref 8.9–10.3)
Chloride: 106 mmol/L (ref 98–111)
Creatinine, Ser: 0.92 mg/dL (ref 0.44–1.00)
GFR calc Af Amer: >60 mL/min (ref >60)
GFR calc non Af Amer: >60 mL/min (ref >60)
Glucose, Bld: 117 mg/dL — ABNORMAL HIGH (ref 70–99)
Potassium: 3.4 mmol/L — ABNORMAL LOW (ref 3.5–5.1)
Sodium: 140 mmol/L (ref 135–145)
Total Bilirubin: 0.5 mg/dL (ref 0.3–1.2)
Total Protein: 6.7 g/dL (ref 6.5–8.1)

## 2019-12-07 MED ORDER — SODIUM CHLORIDE 0.9% FLUSH
10.0000 mL | Freq: Once | INTRAVENOUS | Status: AC
  Administered 2019-12-07: 10 mL
  Filled 2019-12-07: qty 10

## 2019-12-07 MED ORDER — SODIUM CHLORIDE 0.9% FLUSH
10.0000 mL | INTRAVENOUS | Status: AC | PRN
  Administered 2019-12-07: 10 mL
  Filled 2019-12-07: qty 10

## 2019-12-07 MED ORDER — SODIUM CHLORIDE 0.9% IV SOLUTION
250.0000 mL | Freq: Once | INTRAVENOUS | Status: AC
  Administered 2019-12-07: 250 mL via INTRAVENOUS
  Filled 2019-12-07: qty 250

## 2019-12-07 MED ORDER — HEPARIN SOD (PORK) LOCK FLUSH 100 UNIT/ML IV SOLN
500.0000 [IU] | Freq: Every day | INTRAVENOUS | Status: AC | PRN
  Administered 2019-12-07: 500 [IU]
  Filled 2019-12-07: qty 5

## 2019-12-07 NOTE — Patient Instructions (Signed)
https://www.[facility].org/donate-blood/blood-donation-process/what-happens-to-donated-blood/blood-transfusions/types-of-blood-transfusions.html"> https://www.[facility].org/donate-blood/blood-donation-process/what-happens-to-donated-blood/blood-transfusions/risks-complications.html">  Blood Transfusion, Adult, Care After This sheet gives you information about how to care for yourself after your procedure. Your health care provider may also give you more specific instructions. If you have problems or questions, contact your health care provider. What can I expect after the procedure? After the procedure, it is common to have:  Bruising and soreness where the IV was inserted.  A fever or chills on the day of the procedure. This may be your body's response to the new blood cells received.  A headache. Follow these instructions at home: IV insertion site care      Follow instructions from your health care provider about how to take care of your IV insertion site. Make sure you: ? Wash your hands with soap and water before and after you change your bandage (dressing). If soap and water are not available, use hand sanitizer. ? Change your dressing as told by your health care provider.  Check your IV insertion site every day for signs of infection. Check for: ? Redness, swelling, or pain. ? Bleeding from the site. ? Warmth. ? Pus or a bad smell. General instructions  Take over-the-counter and prescription medicines only as told by your health care provider.  Rest as told by your health care provider.  Return to your normal activities as told by your health care provider.  Keep all follow-up visits as told by your health care provider. This is important. Contact a health care provider if:  You have itching or red, swollen areas of skin (hives).  You feel anxious.  You feel weak after doing your normal activities.  You have redness, swelling, warmth, or pain around the IV  insertion site.  You have blood coming from the IV insertion site that does not stop with pressure.  You have pus or a bad smell coming from your IV insertion site. Get help right away if:  You have symptoms of a serious allergic or immune system reaction, including: ? Trouble breathing or shortness of breath. ? Swelling of the face or feeling flushed. ? Fever or chills. ? Pain in the head, back, or chest. ? Dark urine or blood in the urine. ? Widespread rash. ? Fast heartbeat. ? Feeling dizzy or light-headed. If you receive your blood transfusion in an outpatient setting, you will be told whom to contact to report any reactions. These symptoms may represent a serious problem that is an emergency. Do not wait to see if the symptoms will go away. Get medical help right away. Call your local emergency services (911 in the [place] Do not drive yourself to the hospital. Summary  Bruising and tenderness around the IV insertion site are common.  Check your IV insertion site every day for signs of infection.  Rest as told by your health care provider. Return to your normal activities as told by your health care provider.  Get help right away for symptoms of a serious allergic or immune system reaction to blood transfusion. This information is not intended to replace advice given to you by your health care provider. Make sure you discuss any questions you have with your health care provider. Document Revised: 02/02/2019 Document Reviewed: 02/02/2019 Elsevier Patient Education  2020 [employer]  

## 2019-12-07 NOTE — Progress Notes (Signed)
No filgrastim injection today per Cira Rue, NP

## 2019-12-08 LAB — CANCER ANTIGEN 19-9: CA 19-9: 103 U/mL — ABNORMAL HIGH (ref 0–35)

## 2019-12-08 LAB — TYPE AND SCREEN
ABO/RH(D): A POS
Antibody Screen: NEGATIVE
Unit division: 0

## 2019-12-08 LAB — BPAM RBC
Blood Product Expiration Date: 202105032359
ISSUE DATE / TIME: 202104151439
Unit Type and Rh: 6200

## 2019-12-20 NOTE — Progress Notes (Signed)
[name]   Telephone:([phone] Fax:([phone]   Clinic Follow up Note   Patient Care Team: [name], MD as PCP - General (Family Medicine) [name], MD as Consulting Physician (Otolaryngology) [name], MD as Consulting Physician (Oncology)  Date of Service:  12/21/2019  CHIEF COMPLAINT: F/u of pancreatic cancer  SUMMARY OF ONCOLOGIC HISTORY: Oncology History Overview Note  Cancer Staging Pancreatic cancer [facility]) Staging form: Exocrine Pancreas, AJCC 8th Edition - Clinical stage from 08/06/2017: Stage IV (cTX, cN0, pM1) - Signed by [name], MD on 08/12/2017     Pancreatic cancer ([place])  07/23/2017 Imaging   US abdomen limited RUQ 07/23/17 IMPRESSION: 1. Cholelithiasis.  No secondary signs of acute cholecystitis. 2. Multiple solid liver masses measuring up to 6.5 cm in the left lobe of the liver, evaluation for metastatic disease is recommended. These results will be called to the ordering clinician or representative by the Radiologist Assistant, and communication documented in the PACS or zVision Dashboard.   07/23/2017 Imaging   CT Abdomen W Contrast 07/23/17 IMPRESSION: 1. Widespread metastatic disease throughout the liver. No clear primary malignancy identified in the abdomen. The pelvis was not imaged. Tissue sampling recommended. 2. Probable adenopathy superior to the pancreatic tail. No evidence of pancreatic mass. 3. Suspected incidental hemangioma inferiorly in the right hepatic lobe. 4. Nonspecific nodularity in the breasts. The patient has undergone recent (03/25/2017 and 04/01/2017) mammography and ultrasound.   07/29/2017 Initial Diagnosis   Metastasis to liver of unknown origin ([place])   07/29/2017 PET scan   PET 07/29/17  IMPRESSION: 1. Numerous bulky liver masses are hypermetabolic compatible with malignancy. 2. Accentuated activity within or along the pancreatic tail, likely represent a primary pancreatic tumor.  Consider pancreatic protocol MRI to further work this up. 3. The peripancreatic lymph node shown above the pancreatic tail is mildly hypermetabolic favoring malignancy. 4.  Prominent stool throughout the colon favors constipation. 5. 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   08/05/2017 Pathology Results   Liver Biopsy  Diagnosis 08/05/17 Liver, needle/core biopsy - CARCINOMA. - SEE COMMENT. Microscopic Comment The malignant cells are positive for cytokeratin 7. They are negative for arginase, CDX2, cytokeratin 20, estrogen receptor, GATA-3, GCDFP, Glypican 3, Hep Par 1, Napsin A, and TTF-1. This immunohistochemical is nonspecific. Possibly primary sources include pancreatobiliary and upper gastrointestinal. Radiologic correlation is necessary. Of note, organ specific markers (GATA-3, GCDFP-breast, TTF-1, Napsin A-lung, and CDX2-colon) are negative. (JBK:ecj 08/09/2017)   08/21/2017 - 02/10/2018 Chemotherapy   FOLFIRINOX every 2 weeks starting 08/21/17. Dose reduced due to neuropahty and cytopenia    10/14/2017 Imaging   CT CAP WO Contrast 10/14/17 IMPRESSION: Evidence of known numerous liver metastases without significant interval change. No other evidence of metastatic disease within the chest, abdomen or pelvis. Cholelithiasis. Tiny pericardial effusion.   12/02/2017 Genetic Testing   Negative for pathogenic mutation.  The genes analyzed were the 83 genes on [facility]'s Multi-Cancer panel (ALK, APC, ATM, AXIN2, BAP1, BARD1, BLM, BMPR1A, BRCA1, BRCA2, BRIP1, CASR, CDC73, CDH1, CDK4, CDKN1B, CDKN1C, CDKN2A, CEBPA, CHEK2, CTNNA1, DICER1, DIS3L2, EGFR, EPCAM, FH, FLCN, GATA2, GPC3, GREM1, HOXB13, HRAS, KIT, MAX, MEN1, MET, MITF, MLH1, MSH2, MSH3, MSH6, MUTYH, NBN, NF1, NF2, NTHL1, PALB2, PDGFRA, PHOX2B, PMS2, POLD1, POLE, POT1, PRKAR1A, PTCH1, PTEN, RAD50, RAD51C, RAD51D, RB1, RECQL4, RET, RUNX1, SDHA, SDHAF2, SDHB, SDHC, SDHD, SMAD4, SMARCA4, SMARCB1, SMARCE1, STK11, SUFU, TERC, TERT,  TMEM127, TP53, TSC1, TSC2, VHL, WRN, WT1).   12/17/2017 PET scan   IMPRESSION: 1. Although the liver metastases  are still visible on the CT images, they are significantly smaller and retain no abnormal metabolic activity, consistent with response to therapy. 2. No abnormal activity within the pancreas. 3. No disease progression identified. 4. Cholelithiasis.   02/21/2018 PET scan   02/21/2018 PET Scan  IMPRESSION: 1. There are 2 new small nodules identified within the right lung which measure up to 5 mm. These are too small to reliably characterize by PET-CT, but warrant close interval follow-up. 2. Again noted are multifocal liver metastasis. The target lesion within the left lobe is slightly decreased in size when compared with the previous exam. Similar to previous exam there is no abnormal hypermetabolism above background liver activity identified within the liver lesions. 3. Gallstones.   02/23/2018 - 09/04/2018 Chemotherapy   5-FU pump infusion and liposomal irinotecan, every 2 weeks.  First cycle dose reduced due to cytopenia in the travel. Stopped on 08/25/2018 due to disease progression.    05/17/2018 Imaging   05/17/2018 PET Scan IMPRESSION: 1. Mixed response. The 2 small right lung nodules have decreased in size in the interval. Single focus of increased uptake within the liver is new from 02/21/2018 and concerning for recurrent metabolically active liver metastasis. 2. Splenomegaly.  New from previous exam. 3. Diffusely increased bone marrow activity, likely reflecting treatment related changes.   09/01/2018 PET scan   PET 09/01/18 IMPRESSION: 1. Unfortunately there recurrence of hepatic metastasis with multiple new hypermetabolic lesions in LEFT and RIGHT hepatic lobe. 2. New extra hepatic site of malignancy which appears associated the mid pancreas. Difficult to define lesion on noncontrast exam. 3. Diffuse marrow activity is favored benign. 4. No evidence of pulmonary  metastasis.   09/12/2018 - 03/27/2019 Chemotherapy   second-line Gemcitabine and Abraxane for 2 weeks on, 1 week off starting 09/12/2018. Due to neuropathy, I will start her with dose reduced Abraxane. Stopped after 03/27/19 due to disease progression.    12/14/2018 Imaging   CT CAP  IMPRESSION: CT demonstrates acute cholecystitis, with choledocholithiasis of the cystic duct.  These results were discussed by telephone at the time of interpretation on 12/14/2018 at 5:05 pm with Dr. Burr Medico.  Redemonstration of known liver metastases, with positive response to therapy, interval reduction in size of all lesions, with multiple demonstrating significant internal necrosis.  No acute finding of the chest.  Ancillary findings as above.   03/21/2019 Imaging   CT CAP W Contrast IMPRESSION: 1. Numerous rim enhancing liver metastases show overall trend towards mild progression although 1 of the index lesions is stable in the interval. 2. Interval development of portocaval lymphadenopathy concerning for disease progression. 3. Slight progression of main duct dilatation in the pancreas with abrupt cut off in the region of the body. 4. Interval development of a 9 mm subpleural nodule at the right base along the posterior hemidiaphragm. Close attention on follow-up recommended as metastatic disease not excluded. 5. New splenomegaly. 6. Cholelithiasis with diffuse gallbladder wall thickening. Gallbladder wall thickening is decreased in the interval. Stones and sludge noted in the gallbladder lumen.   04/09/2019 Imaging   CT AP W Contrast 04/09/19  IMPRESSION: 1. Multiple gallstones in lumen gallbladder without evidence acute cholecystitis. No significant change from prior. No biliary duct dilatation. Common bile duct normal. 2. Widespread hepatic metastasis unchanged from comparison CT. 3. Chronic pancreatic duct dilatation in the body and tail unchanged. 4. No bowel obstruction. 5. Small  amount of intraperitoneal free fluid unchanged.   04/10/2019 -  Chemotherapy   Third-line GTX with Xeloda  1023m BID day 1-14, off for 1 week with Docetaxel and Gemcitbine on day 4 and day 11 every 3 weeks starting oral chemo on 04/10/19.    07/03/2019 Imaging   CT CAP W Contrast  IMPRESSION: 1. Overall stable diffuse hepatic metastatic disease. No new/progressive findings. 2. Stable to slightly larger periportal lymph node. 3. Stable CT appearance of the pancreas. Abrupt cut off of a dilated main pancreatic duct in the body/head junction region without obvious pancreatic lesion. 4. No pulmonary nodules are identified at the lung bases. 5. Stable mild splenomegaly. 6. No new/acute findings.     10/17/2019 Imaging   CT CAP W Contrast  IMPRESSION: 1. Overall mild progression of metastatic disease. Portacaval lymphadenopathy has increased. Enlarging lesion along the posterior right diaphragm is suspicious for growing right pleural metastasis. 2. Bulky liver metastatic disease is relatively stable with mild mixed changes as detailed. 3. Pancreatic body tumor is stable. 4. Moderate splenomegaly, increased. 5. Aortic Atherosclerosis (ICD10-I70.0). Additional chronic findings as detailed.       Pancreatic cancer metastasized to liver ([place]  08/11/2017 Initial Diagnosis   Pancreatic cancer metastasized to liver ([facility]      CURRENT THERAPY:  Third-line GTX with Xeloda10033mID day 1-14, off for 1 week with Docetaxel3080m2and Gemcitabine750m51mon day 4 and day 11 every 3 weeks starting oral chemo on 04/10/19.Starting 07/06/19 she will reduce Xeloda to 1000mg11mthe AM and 500mg 4mhe PM due to low blood counts.   INTERVAL HISTORY:  JaniceKAYELYN LEMONre for a follow up and treatment. She presents to the clinic alone. [place] She started C13 Xeloda on 4/26.  She notes her neuropathy is stable on  Gabapentin BID. She denies any recent gallbladder issues.    REVIEW OF SYSTEMS:   Constitutional: Denies fevers, chills or abnormal weight loss Eyes: Denies blurriness of vision Ears, nose, mouth, throat, and face: Denies mucositis or sore throat Respiratory: Denies cough, dyspnea or wheezes Cardiovascular: Denies palpitation, chest discomfort or lower extremity swelling Gastrointestinal:  Denies nausea, heartburn or change in bowel habits Skin: Denies abnormal skin rashes Lymphatics: Denies new lymphadenopathy or easy bruising Neurological: (+) Neuropathy stable  Behavioral/Psych: Mood is stable, no new changes  All other systems were reviewed with the patient and are negative.  MEDICAL HISTORY:  Past Medical History:  Diagnosis Date  . Diarrhea of presumed infectious origin 01/12/2018  . Gastroenteritis 01/12/2018  . Genetic testing 12/02/2017   Multi-Cancer panel (83 genes) @ [facility] - No pathogenic mutations detected  . Meniere disease 2016  . met pancreatic ca to liver dx'd 07/2017  . Pancreatitis     SURGICAL HISTORY: Past Surgical History:  Procedure Laterality Date  . CERVICAL ABLATION    . ENDOMETRIAL ABLATION  2011  . IR CHOLANGIOGRAM EXISTING TUBE  01/09/2019  . IR FLUORO GUIDE PORT INSERTION RIGHT  08/12/2017  . IR PERC CHOLECYSTOSTOMY  12/15/2018  . IR US [place] VASC ACCESS RIGHT  08/12/2017  . [place]quonock Bridgehave reviewed the social history and family history with the patient and they are unchanged from previous note.  ALLERGIES:  has No Known Allergies.  MEDICATIONS:  Current Outpatient Medications  Medication Sig Dispense Refill  . b complex vitamins tablet Take 1 tablet by mouth daily.    . capecitabine (XELODA) 500 MG tablet TAKE 2 TABLETS BY MOUTH IN THE MORNING AND 1 TABLET IN  THE EVENING, EVERY 12 HOURS, WITHIN 30 MINUTES OF MEALS ON DAYS 1 TO 14 OF EACH 21 DAY CYCLE 42 tablet 3  . Cholecalciferol (VITAMIN D3 GUMMIES ADULT) 25 MCG (1000  UT) CHEW Chew by mouth. Taking 2 gummies    . cyclobenzaprine (FLEXERIL) 5 MG tablet Take 1 tablet (5 mg total) by mouth 3 (three) times daily as needed for muscle spasms. 30 tablet 0  . gabapentin (NEURONTIN) 100 MG capsule Take 1 capsule (100 mg total) by mouth 3 (three) times daily. 270 capsule 2  . lidocaine-prilocaine (EMLA) cream Apply 1 application topically as needed. 30 g 2  . loperamide (IMODIUM) 2 MG capsule Take 1 capsule (2 mg total) by mouth as needed for diarrhea or loose stools. 30 capsule 0  . loratadine (CLARITIN) 10 MG tablet Take 10 mg by mouth daily as needed for allergies.    . magic mouthwash w/lidocaine SOLN Take 10 mLs by mouth 3 (three) times daily as needed for mouth pain. Swish and spit 10 ML by mouth 3 times daily as needed for mouth pain. 240 mL 0  . meclizine (ANTIVERT) 25 MG tablet Take 25 mg by mouth 3 (three) times daily as needed for dizziness.    . ondansetron (ZOFRAN-ODT) 8 MG disintegrating tablet TAKE 1 TABLET BY MOUTH EVERY 8 HOURS AS NEEDED FOR NAUSE OR VOMITING 40 tablet 1  . potassium chloride (KLOR-CON) 10 MEQ tablet Take 1 tablet (10 mEq total) by mouth 4 (four) times daily. 360 tablet 2  . prochlorperazine (COMPAZINE) 10 MG tablet Take 1 tablet (10 mg total) by mouth every 6 (six) hours as needed for nausea or vomiting. 40 tablet 2  . PROMACTA 50 MG tablet TAKE 1 TABLET BY MOUTH 1 TIME A DAY. 30 tablet 1  . Rivaroxaban (XARELTO) 15 MG TABS tablet TAKE 1 TABLET(15 MG) BY MOUTH DAILY 90 tablet 1  . traMADol (ULTRAM) 50 MG tablet Take 1 tablet (50 mg total) by mouth every 6 (six) hours as needed for moderate pain or severe pain. 30 tablet 0   No current facility-administered medications for this visit.   Facility-Administered Medications Ordered in Other Visits  Medication Dose Route Frequency Provider Last Rate Last Admin  . DOCEtaxel (TAXOTERE) 40 mg in sodium chloride 0.9 % 100 mL chemo infusion  30 mg/m2 (Treatment Plan Recorded) Intravenous Once  [name], MD      . gemcitabine (GEMZAR) 912 mg in sodium chloride 0.9 % 250 mL chemo infusion  600 mg/m2 (Treatment Plan Recorded) Intravenous Once [name], MD      . heparin lock flush 100 unit/mL  500 Units Intracatheter Once PRN [name], MD      . sodium chloride flush (NS) 0.9 % injection 10 mL  10 mL Intracatheter Once [name], MD      . sodium chloride flush (NS) 0.9 % injection 10 mL  10 mL Intracatheter PRN [name], MD        PHYSICAL EXAMINATION: ECOG PERFORMANCE STATUS: 1 - Symptomatic but completely ambulatory  Vitals:   12/21/19 1010  BP: 107/72  Pulse: 93  Resp: 18  Temp: 98.5 F (36.9 C)  SpO2: 99%   Filed Weights   12/21/19 1010  Weight: 103 lb 9.6 oz (47 kg)    Due to COVID19 we will limit examination to appearance. Patient had no complaints.  GENERAL:alert, no distress and comfortable SKIN: skin color normal, no rashes or significant lesions EYES: normal, Conjunctiva are pink and non-injected, sclera  clear  NEURO: alert & oriented x 3 with fluent speech   LABORATORY DATA:  I have reviewed the data as listed CBC Latest Ref Rng & Units 12/21/2019 12/07/2019 11/29/2019  WBC 4.0 - 10.5 K/uL 6.1 4.4 4.3  Hemoglobin 12.0 - 15.0 g/dL 10.1(L) 8.1(L) 8.6(L)  Hematocrit 36.0 - 46.0 % 32.0(L) 25.6(L) 27.3(L)  Platelets 150 - 400 K/uL 476(H) 196 322     CMP Latest Ref Rng & Units 12/21/2019 12/07/2019 11/29/2019  Glucose 70 - 99 mg/dL 144(H) 117(H) 151(H)  BUN 6 - 20 mg/dL _0 Creatinine 0.44 - 1.00 mg/dL 1.08(H) 0.92 1.09(H)  Sodium 135 - 145 mmol/L 139 140 140  Potassium 3.5 - 5.1 mmol/L 3.9 3.4(L) 3.6  Chloride 98 - 111 mmol/L 104 106 105  CO2 22 - 32 mmol/L _1 Calcium 8.9 - 10.3 mg/dL 9.4 9.0 9.2  Total Protein 6.5 - 8.1 g/dL 7.4 6.7 6.8  Total Bilirubin 0.3 - 1.2 mg/dL 0.7 0.5 0.8  Alkaline Phos 38 - 126 U/L 277(H) 212(H) 189(H)  AST 15 - 41 U/L 35 45(H) 43(H)  ALT 0 - 44 U/L _2 RADIOGRAPHIC STUDIES: I have personally  reviewed the radiological images as listed and agreed with the findings in the report. No results found.   ASSESSMENT & PLAN:  Morgan Dawson is a 59 y.o. female with    1. 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 She progressed on this based on 08/2018 PET scan. -She mildly progressed onsecond-line Gemcitabine and Abraxanein liver and new lung nodule.  -She is currently onthird-line GTX with Xeloda 1048m BID 2 weeks on/1 week off andDocetaxelandGemcitabineon day 4 and 11 q3weeks starting 04/10/19.Granix has been added to week 3 due toneutropenia. Starting 07/03/19 Xeloda has been decreased to10061min the AM and 50034mn the PMdBeverly Shoreser CT CAP from 10/17/19 showed overall mild progression of metastatic disease. Her last Ca 19.9 continues to fluctuate, but overallstable.  -[facility]. Right [facility], [facility], one phase 1 (open) and one phase 2 (upcoming soon). I have reached out to Dr[facility] McRAleatha Borer [facility].  We discussed the washout time before she can participate clinical trial, which is usually 4 weeks. We discussed high screening failure for clinical trial, and high possibility of disease progression while she is off treatment and waiting to participate clinical trial.  She voiced good understanding. -I have communicated with Dr. MetDennison Nancy [facility]ukThe [facility]though she is not eligible for their trial, she recommended testing NRG1 fusion to see if she is a candidate for targeted therapy. -FO also listed some targeted therapy options based on her RAF mutation, such as regorafenib, sorafenib, MEK inhibitor, I will look into the clinical data to see if we can use one of them  -In the  meantime will continue GTX with Xeloda1000m50m the AM and 500mg34mthe PM and day 4Docetaxel/Gemcitabine -Labs reviewed, hg 10.1, plt 476K, BG 144, Cr 1.08, albumin 3.1, Alk Phos 277. Overall adequate to proceed with C13D4 GT today. She started C13 Xeloda on 4/26. Plan to stop after this cycle for [facility] to [facility]  -May scan in May.    2.Abdominal pain -Continue tramadol PRN for pain, has not neededmuchrecently, no fever recently  3.Malnutrition -Due to chemo and underlying metastatic cancer. -Continue nutritional supplement and f/u with dietician. -Appetite and eatinghas been fair but she has been able to maintain weight well.Stable.  4. Transaminitis -Due to underlying chemo and malignancy -Resolved except Alk Phos at 277 today (12/21/19)  5. Pancytopenia, secondary to chemo -Required chemo dose reductions. -S/p blood transfusion as needed withHg<8. Last on 09/20/18 -OnPromacta, willcontinue. Due to worsening thrombocytopenia from chemo, increasedto 68m daily starting 05/31/19.May hold when she is off chemo -Granix has been added tothirdweek and Xeloda dose reduced starting 07/03/19 -She is responding well to Granix.    6. Hypokalemia, CKD Stage III -Sheis currently on KCL198m TID -Has been stable.  7.Peripheral neuropathy, grade 2  -Currently on Neurontin 200 mg at night and Bcomplex.She used ice bags during infusion. -Mild and stable in hands and has mildly progressed in feet with current chemo. Will continue to monitor.Stable.  8.H/o Acute cholecystitis, status post cholecystectomy tube placement 4/23/20per IR -She was seen by surgery during herhospitalization, surgery was not recommended due to high risk of complication, and concerns for cancer progression when holding chemotherapy for surgery -Drain intact,currently has decreased to 2419mlightlybloody drainage per  day -afebrileand no chills. -Her CT CAP from 12/14/18 shows she has numerus gallstones. Will monitor for any inflammation. -Imaging in 12/2018 consistent with cholecystitis with cystic duct obstruction. No leak on HIDA. -She waspreviouslygiven prophylactic 5-day course levaquin on 04/13/19 due to fever spikes during chemo -noevidence of recurrent infectionrecently.  9. LE DVT -Diagnosed on December 15, 2018, when she was hospitalized for cholecystitis -Due to her bloody biliary drainage, and moderate thrombocytopenia from chemotherapy, she is on low-doseXarelto15m22mily (no loading dose), she is tolerating well and will continue  10.Goal of care discussion -She is full code now -She understands her cancer is incurable at this stage, and the goal of therapy is palliative, to prolong his life, and prevent cancer related symptoms   Plan -She started C13 Xeloda on 12/18/19, will continue for 14 days. -Labs reviewed and adequate to proceed with chemo gemcitabine and docetaxel today, she will return next week for day 11 treatment. -[facility], if she is going on trial in early June, will stop chemo after this cycle   No problem-specific Assessment & Plan notes found for this encounter.   No orders of the defined types were placed in this encounter.  All questions were answered. The patient knows to call the clinic with any problems, questions or concerns. No barriers to learning was detected. The total time spent in the appointment was 30 minutes.     Yan [name] 12/21/2019   I, AmoyJoslyn Devon acting as scribe for Yan [name].   I have reviewed the above documentation for accuracy and completeness, and I agree with the above.

## 2019-12-21 ENCOUNTER — Inpatient Hospital Stay (HOSPITAL_BASED_OUTPATIENT_CLINIC_OR_DEPARTMENT_OTHER): Payer: BLUE CROSS/BLUE SHIELD | Admitting: Hematology

## 2019-12-21 ENCOUNTER — Inpatient Hospital Stay: Payer: BLUE CROSS/BLUE SHIELD

## 2019-12-21 ENCOUNTER — Other Ambulatory Visit: Payer: Self-pay

## 2019-12-21 ENCOUNTER — Encounter: Payer: Self-pay | Admitting: Hematology

## 2019-12-21 VITALS — BP 107/72 | HR 93 | Temp 98.5°F | Resp 18 | Ht 65.0 in | Wt 103.6 lb

## 2019-12-21 DIAGNOSIS — C787 Secondary malignant neoplasm of liver and intrahepatic bile duct: Secondary | ICD-10-CM

## 2019-12-21 DIAGNOSIS — C259 Malignant neoplasm of pancreas, unspecified: Secondary | ICD-10-CM | POA: Diagnosis not present

## 2019-12-21 DIAGNOSIS — C251 Malignant neoplasm of body of pancreas: Secondary | ICD-10-CM

## 2019-12-21 DIAGNOSIS — Z7189 Other specified counseling: Secondary | ICD-10-CM

## 2019-12-21 DIAGNOSIS — Z95828 Presence of other vascular implants and grafts: Secondary | ICD-10-CM

## 2019-12-21 LAB — CBC WITH DIFFERENTIAL/PLATELET
Abs Immature Granulocytes: 0.04 10*3/uL (ref 0.00–0.07)
Basophils Absolute: 0.1 10*3/uL (ref 0.0–0.1)
Basophils Relative: 1 %
Eosinophils Absolute: 0.1 10*3/uL (ref 0.0–0.5)
Eosinophils Relative: 1 %
HCT: 32 % — ABNORMAL LOW (ref 36.0–46.0)
Hemoglobin: 10.1 g/dL — ABNORMAL LOW (ref 12.0–15.0)
Immature Granulocytes: 1 %
Lymphocytes Relative: 8 %
Lymphs Abs: 0.5 10*3/uL — ABNORMAL LOW (ref 0.7–4.0)
MCH: 32.3 pg (ref 26.0–34.0)
MCHC: 31.6 g/dL (ref 30.0–36.0)
MCV: 102.2 fL — ABNORMAL HIGH (ref 80.0–100.0)
Monocytes Absolute: 0.6 10*3/uL (ref 0.1–1.0)
Monocytes Relative: 9 %
Neutro Abs: 4.9 10*3/uL (ref 1.7–7.7)
Neutrophils Relative %: 80 %
Platelets: 476 10*3/uL — ABNORMAL HIGH (ref 150–400)
RBC: 3.13 MIL/uL — ABNORMAL LOW (ref 3.87–5.11)
RDW: 16.8 % — ABNORMAL HIGH (ref 11.5–15.5)
WBC: 6.1 10*3/uL (ref 4.0–10.5)
nRBC: 0 % (ref 0.0–0.2)

## 2019-12-21 LAB — COMPREHENSIVE METABOLIC PANEL
ALT: 23 U/L (ref 0–44)
AST: 35 U/L (ref 15–41)
Albumin: 3.1 g/dL — ABNORMAL LOW (ref 3.5–5.0)
Alkaline Phosphatase: 277 U/L — ABNORMAL HIGH (ref 38–126)
Anion gap: 11 (ref 5–15)
BUN: 18 mg/dL (ref 6–20)
CO2: 24 mmol/L (ref 22–32)
Calcium: 9.4 mg/dL (ref 8.9–10.3)
Chloride: 104 mmol/L (ref 98–111)
Creatinine, Ser: 1.08 mg/dL — ABNORMAL HIGH (ref 0.44–1.00)
GFR calc Af Amer: >60 mL/min (ref >60)
GFR calc non Af Amer: 57 mL/min — ABNORMAL LOW (ref >60)
Glucose, Bld: 144 mg/dL — ABNORMAL HIGH (ref 70–99)
Potassium: 3.9 mmol/L (ref 3.5–5.1)
Sodium: 139 mmol/L (ref 135–145)
Total Bilirubin: 0.7 mg/dL (ref 0.3–1.2)
Total Protein: 7.4 g/dL (ref 6.5–8.1)

## 2019-12-21 MED ORDER — SODIUM CHLORIDE 0.9% FLUSH
10.0000 mL | Freq: Once | INTRAVENOUS | Status: AC
  Administered 2019-12-21: 10 mL
  Filled 2019-12-21: qty 10

## 2019-12-21 MED ORDER — SODIUM CHLORIDE 0.9 % IV SOLN
30.0000 mg/m2 | Freq: Once | INTRAVENOUS | Status: AC
  Administered 2019-12-21: 40 mg via INTRAVENOUS
  Filled 2019-12-21: qty 4

## 2019-12-21 MED ORDER — SODIUM CHLORIDE 0.9 % IV SOLN
10.0000 mg | Freq: Once | INTRAVENOUS | Status: AC
  Administered 2019-12-21: 10 mg via INTRAVENOUS
  Filled 2019-12-21: qty 10

## 2019-12-21 MED ORDER — HEPARIN SOD (PORK) LOCK FLUSH 100 UNIT/ML IV SOLN
500.0000 [IU] | Freq: Once | INTRAVENOUS | Status: AC | PRN
  Administered 2019-12-21: 500 [IU]
  Filled 2019-12-21: qty 5

## 2019-12-21 MED ORDER — SODIUM CHLORIDE 0.9 % IV SOLN
Freq: Once | INTRAVENOUS | Status: AC
  Filled 2019-12-21: qty 250

## 2019-12-21 MED ORDER — SODIUM CHLORIDE 0.9% FLUSH
10.0000 mL | INTRAVENOUS | Status: DC | PRN
  Administered 2019-12-21: 10 mL
  Filled 2019-12-21: qty 10

## 2019-12-21 MED ORDER — SODIUM CHLORIDE 0.9 % IV SOLN
600.0000 mg/m2 | Freq: Once | INTRAVENOUS | Status: AC
  Administered 2019-12-21: 912 mg via INTRAVENOUS
  Filled 2019-12-21: qty 23.99

## 2019-12-21 NOTE — Patient Instructions (Signed)
[facility] Discharge Instructions for Patients Receiving Chemotherapy  Today you received the following chemotherapy agents Gemzar and Taxotere.   To help prevent nausea and vomiting after your treatment, we encourage you to take your nausea medication as directed.   If you develop nausea and vomiting that is not controlled by your nausea medication, call the clinic.   BELOW ARE SYMPTOMS THAT SHOULD BE REPORTED IMMEDIATELY:  *FEVER GREATER THAN 100.5 F  *CHILLS WITH OR WITHOUT FEVER  NAUSEA AND VOMITING THAT IS NOT CONTROLLED WITH YOUR NAUSEA MEDICATION  *UNUSUAL SHORTNESS OF BREATH  *UNUSUAL BRUISING OR BLEEDING  TENDERNESS IN MOUTH AND THROAT WITH OR WITHOUT PRESENCE OF ULCERS  *URINARY PROBLEMS  *BOWEL PROBLEMS  UNUSUAL RASH Items with * indicate a potential emergency and should be followed up as soon as possible.  Feel free to call the clinic should you have any questions or concerns. The clinic phone number is [phone].  Please show the CHEMO ALERT CARD at check-in to the Emergency Department and triage nurse.   

## 2019-12-22 ENCOUNTER — Other Ambulatory Visit: Payer: BLUE CROSS/BLUE SHIELD

## 2019-12-22 NOTE — Progress Notes (Signed)
Pharmacist Chemotherapy Monitoring - Follow Up Assessment    I verify that I have reviewed each item in the below checklist:  . Regimen for the patient is scheduled for the appropriate day and plan matches scheduled date. Marland Kitchen Appropriate non-routine labs are ordered dependent on drug ordered. . If applicable, additional medications reviewed and ordered per protocol based on lifetime cumulative doses and/or treatment regimen.   Plan for follow-up and/or issues identified: No . I-vent associated with next due treatment: No . MD and/or nursing notified: No  Romualdo Bolk [facility] 12/22/2019 9:40 AM

## 2019-12-25 ENCOUNTER — Ambulatory Visit: Payer: BLUE CROSS/BLUE SHIELD | Admitting: Hematology

## 2019-12-28 ENCOUNTER — Inpatient Hospital Stay: Payer: BLUE CROSS/BLUE SHIELD

## 2019-12-28 ENCOUNTER — Other Ambulatory Visit: Payer: Self-pay

## 2019-12-28 ENCOUNTER — Inpatient Hospital Stay: Payer: BLUE CROSS/BLUE SHIELD | Attending: Hematology

## 2019-12-28 VITALS — BP 105/71 | HR 88 | Temp 98.2°F | Resp 16 | Wt 104.5 lb

## 2019-12-28 DIAGNOSIS — C787 Secondary malignant neoplasm of liver and intrahepatic bile duct: Secondary | ICD-10-CM

## 2019-12-28 DIAGNOSIS — Z5111 Encounter for antineoplastic chemotherapy: Secondary | ICD-10-CM | POA: Insufficient documentation

## 2019-12-28 DIAGNOSIS — C251 Malignant neoplasm of body of pancreas: Secondary | ICD-10-CM

## 2019-12-28 DIAGNOSIS — C259 Malignant neoplasm of pancreas, unspecified: Secondary | ICD-10-CM | POA: Insufficient documentation

## 2019-12-28 DIAGNOSIS — Z5189 Encounter for other specified aftercare: Secondary | ICD-10-CM | POA: Diagnosis not present

## 2019-12-28 DIAGNOSIS — Z7189 Other specified counseling: Secondary | ICD-10-CM

## 2019-12-28 DIAGNOSIS — Z95828 Presence of other vascular implants and grafts: Secondary | ICD-10-CM

## 2019-12-28 LAB — COMPREHENSIVE METABOLIC PANEL
ALT: 41 U/L (ref 0–44)
AST: 55 U/L — ABNORMAL HIGH (ref 15–41)
Albumin: 3.2 g/dL — ABNORMAL LOW (ref 3.5–5.0)
Alkaline Phosphatase: 259 U/L — ABNORMAL HIGH (ref 38–126)
Anion gap: 12 (ref 5–15)
BUN: 15 mg/dL (ref 6–20)
CO2: 25 mmol/L (ref 22–32)
Calcium: 9.4 mg/dL (ref 8.9–10.3)
Chloride: 105 mmol/L (ref 98–111)
Creatinine, Ser: 0.98 mg/dL (ref 0.44–1.00)
GFR calc Af Amer: >60 mL/min (ref >60)
GFR calc non Af Amer: >60 mL/min (ref >60)
Glucose, Bld: 151 mg/dL — ABNORMAL HIGH (ref 70–99)
Potassium: 3.5 mmol/L (ref 3.5–5.1)
Sodium: 142 mmol/L (ref 135–145)
Total Bilirubin: 0.6 mg/dL (ref 0.3–1.2)
Total Protein: 7.3 g/dL (ref 6.5–8.1)

## 2019-12-28 LAB — CBC WITH DIFFERENTIAL/PLATELET
Abs Immature Granulocytes: 0.01 10*3/uL (ref 0.00–0.07)
Basophils Absolute: 0 10*3/uL (ref 0.0–0.1)
Basophils Relative: 1 %
Eosinophils Absolute: 0 10*3/uL (ref 0.0–0.5)
Eosinophils Relative: 0 %
HCT: 31.5 % — ABNORMAL LOW (ref 36.0–46.0)
Hemoglobin: 9.8 g/dL — ABNORMAL LOW (ref 12.0–15.0)
Immature Granulocytes: 0 %
Lymphocytes Relative: 12 %
Lymphs Abs: 0.4 10*3/uL — ABNORMAL LOW (ref 0.7–4.0)
MCH: 31.8 pg (ref 26.0–34.0)
MCHC: 31.1 g/dL (ref 30.0–36.0)
MCV: 102.3 fL — ABNORMAL HIGH (ref 80.0–100.0)
Monocytes Absolute: 0.3 10*3/uL (ref 0.1–1.0)
Monocytes Relative: 9 %
Neutro Abs: 2.6 10*3/uL (ref 1.7–7.7)
Neutrophils Relative %: 78 %
Platelets: 212 10*3/uL (ref 150–400)
RBC: 3.08 MIL/uL — ABNORMAL LOW (ref 3.87–5.11)
RDW: 16.6 % — ABNORMAL HIGH (ref 11.5–15.5)
WBC: 3.3 10*3/uL — ABNORMAL LOW (ref 4.0–10.5)
nRBC: 0 % (ref 0.0–0.2)

## 2019-12-28 MED ORDER — SODIUM CHLORIDE 0.9 % IV SOLN
600.0000 mg/m2 | Freq: Once | INTRAVENOUS | Status: AC
  Administered 2019-12-28: 912 mg via INTRAVENOUS
  Filled 2019-12-28: qty 23.99

## 2019-12-28 MED ORDER — SODIUM CHLORIDE 0.9% FLUSH
10.0000 mL | INTRAVENOUS | Status: DC | PRN
  Administered 2019-12-28: 10 mL
  Filled 2019-12-28: qty 10

## 2019-12-28 MED ORDER — SODIUM CHLORIDE 0.9 % IV SOLN
30.0000 mg/m2 | Freq: Once | INTRAVENOUS | Status: AC
  Administered 2019-12-28: 40 mg via INTRAVENOUS
  Filled 2019-12-28: qty 4

## 2019-12-28 MED ORDER — SODIUM CHLORIDE 0.9 % IV SOLN
Freq: Once | INTRAVENOUS | Status: AC
  Filled 2019-12-28: qty 250

## 2019-12-28 MED ORDER — SODIUM CHLORIDE 0.9 % IV SOLN
10.0000 mg | Freq: Once | INTRAVENOUS | Status: AC
  Administered 2019-12-28: 10 mg via INTRAVENOUS
  Filled 2019-12-28: qty 10

## 2019-12-28 MED ORDER — SODIUM CHLORIDE 0.9% FLUSH
10.0000 mL | Freq: Once | INTRAVENOUS | Status: AC
  Administered 2019-12-28: 10 mL
  Filled 2019-12-28: qty 10

## 2019-12-28 MED ORDER — HEPARIN SOD (PORK) LOCK FLUSH 100 UNIT/ML IV SOLN
500.0000 [IU] | Freq: Once | INTRAVENOUS | Status: AC | PRN
  Administered 2019-12-28: 500 [IU]
  Filled 2019-12-28: qty 5

## 2019-12-28 NOTE — Patient Instructions (Signed)

## 2019-12-28 NOTE — Patient Instructions (Signed)
[facility] Discharge Instructions for Patients Receiving Chemotherapy  Today you received the following chemotherapy agents Gemzar,Taxotere To help prevent nausea and vomiting after your treatment, we encourage you to take your nausea medication as directed  If you develop nausea and vomiting that is not controlled by your nausea medication, call the clinic.   BELOW ARE SYMPTOMS THAT SHOULD BE REPORTED IMMEDIATELY:  *FEVER GREATER THAN 100.5 F  *CHILLS WITH OR WITHOUT FEVER  NAUSEA AND VOMITING THAT IS NOT CONTROLLED WITH YOUR NAUSEA MEDICATION  *UNUSUAL SHORTNESS OF BREATH  *UNUSUAL BRUISING OR BLEEDING  TENDERNESS IN MOUTH AND THROAT WITH OR WITHOUT PRESENCE OF ULCERS  *URINARY PROBLEMS  *BOWEL PROBLEMS  UNUSUAL RASH Items with * indicate a potential emergency and should be followed up as soon as possible.  Feel free to call the clinic should you have any questions or concerns. The clinic phone number is [phone].  Please show the CHEMO ALERT CARD at check-in to the Emergency Department and triage nurse.   

## 2019-12-29 ENCOUNTER — Encounter: Payer: Self-pay | Admitting: Hematology

## 2019-12-29 LAB — CANCER ANTIGEN 19-9: CA 19-9: 127 U/mL — ABNORMAL HIGH (ref 0–35)

## 2020-01-01 ENCOUNTER — Telehealth: Payer: Self-pay

## 2020-01-01 NOTE — Telephone Encounter (Signed)
Last OV, scan, labs, pathology, facesheet faxed to [phone], [facility].

## 2020-01-02 ENCOUNTER — Inpatient Hospital Stay: Payer: BLUE CROSS/BLUE SHIELD

## 2020-01-02 ENCOUNTER — Other Ambulatory Visit: Payer: Self-pay

## 2020-01-02 ENCOUNTER — Telehealth: Payer: Self-pay | Admitting: Nurse Practitioner

## 2020-01-02 VITALS — BP 103/70 | HR 95 | Temp 98.7°F | Resp 20

## 2020-01-02 DIAGNOSIS — C259 Malignant neoplasm of pancreas, unspecified: Secondary | ICD-10-CM

## 2020-01-02 DIAGNOSIS — Z95828 Presence of other vascular implants and grafts: Secondary | ICD-10-CM

## 2020-01-02 DIAGNOSIS — C787 Secondary malignant neoplasm of liver and intrahepatic bile duct: Secondary | ICD-10-CM | POA: Diagnosis not present

## 2020-01-02 MED ORDER — FILGRASTIM-SNDZ 300 MCG/0.5ML IJ SOSY
300.0000 ug | PREFILLED_SYRINGE | Freq: Once | INTRAMUSCULAR | Status: AC
  Administered 2020-01-02: 300 ug via SUBCUTANEOUS

## 2020-01-02 MED ORDER — FILGRASTIM-SNDZ 300 MCG/0.5ML IJ SOSY
PREFILLED_SYRINGE | INTRAMUSCULAR | Status: AC
  Filled 2020-01-02: qty 0.5

## 2020-01-02 NOTE — Patient Instructions (Signed)

## 2020-01-02 NOTE — Telephone Encounter (Addendum)
She plans to discuss this at her appointment and will call us with and update Friday 5/14, so that we can adjust her schedule accordingly. She appreciates the call.   Cira Rue, NP   ----- Message from [name], MD sent at 01/02/2020 10:49 AM EDT ----- OK, I will let her and her oncologist at [facility] decide if she needs one more cycle chemo here then. Please let her call us on Friday 5/14.   Ajeet Casasola, could you communicate with her?  Thanks  Krista Blue  ----- Message ----- From: Jonnie Finner, RN Sent: 01/02/2020  10:00 AM EDT To: Alla Feeling, NP, [name], MD  She is being seen at [facility] on 5/13.  Sorry.  Malachy Mood ----- Message ----- From: [name], MD Sent: 01/02/2020   9:54 AM EDT To: Jonnie Finner, RN, Alla Feeling, NP  I only see her injection appointment at 10:30am today, nothing on 5/13. Did you request her appointment on 5/13?   Can one of you talk to her when she is here today? And find out when her appointment at [facility] is.  Thanks   Krista Blue  ----- Message ----- From: Jonnie Finner, RN Sent: 01/02/2020   9:45 AM EDT To: Alla Feeling, NP, [name], MD  She has an appointment on 5/13 (this Thursday).  Malachy Mood ----- Message ----- From: [name], MD Sent: 01/02/2020   9:42 AM EDT To: Jonnie Finner, RN, [facility]  Anfernee Peschke,  Could you call Thayer Headings and let her know that I will schedule one more cycle chemo while she is waiting for her appointment at [facility]. She needs to start Xeloda on Monday 5/17, and I have sent a scheduling message for lab, fush, f/u with you or me and chemo on 5/20 and 5/27.  Please refill her Xeloda if needed.  Thanks  Krista Blue

## 2020-01-03 ENCOUNTER — Telehealth: Payer: Self-pay

## 2020-01-03 NOTE — Telephone Encounter (Signed)
Additional records faxed to [facility] Attn: Lockie Mola

## 2020-01-04 ENCOUNTER — Other Ambulatory Visit: Payer: Self-pay | Admitting: Hematology

## 2020-01-04 DIAGNOSIS — C259 Malignant neoplasm of pancreas, unspecified: Secondary | ICD-10-CM

## 2020-01-04 NOTE — Progress Notes (Signed)
Patient left voice message that she had been seen at [facility] for clinical trial and the oncologist there said she needs to have a CT scan done asap.  If the results show improvement or stability they would not accept her into the trial.  I made Dr. Burr Medico aware and she has ordered CT CAP with contrast.    I called patient back to acknowledge receipt of her message and explained we will get PA for CT and get it scheduled asap.  She states she is out of town next Thursday and Friday 5/20 and 5/21 so would need to be done earlier next week.  I told her we will try to get it scheduled by then. She verbalized an understanding.

## 2020-01-05 ENCOUNTER — Telehealth: Payer: Self-pay | Admitting: Hematology

## 2020-01-05 NOTE — Telephone Encounter (Signed)
Scheduled appt per 5/13 sch message - pt aware of appt date and time   

## 2020-01-09 ENCOUNTER — Other Ambulatory Visit: Payer: Self-pay

## 2020-01-09 ENCOUNTER — Inpatient Hospital Stay: Payer: BLUE CROSS/BLUE SHIELD

## 2020-01-09 ENCOUNTER — Ambulatory Visit (HOSPITAL_COMMUNITY)
Admission: RE | Admit: 2020-01-09 | Discharge: 2020-01-09 | Disposition: A | Discharge status: Home / Self Care | Payer: BLUE CROSS/BLUE SHIELD | Source: Ambulatory Visit | Attending: Hematology | Admitting: Hematology

## 2020-01-09 DIAGNOSIS — C259 Malignant neoplasm of pancreas, unspecified: Secondary | ICD-10-CM

## 2020-01-09 DIAGNOSIS — C787 Secondary malignant neoplasm of liver and intrahepatic bile duct: Secondary | ICD-10-CM | POA: Insufficient documentation

## 2020-01-09 DIAGNOSIS — Z95828 Presence of other vascular implants and grafts: Secondary | ICD-10-CM

## 2020-01-09 DIAGNOSIS — C251 Malignant neoplasm of body of pancreas: Secondary | ICD-10-CM

## 2020-01-09 LAB — CBC WITH DIFFERENTIAL/PLATELET
Abs Immature Granulocytes: 0.12 10*3/uL — ABNORMAL HIGH (ref 0.00–0.07)
Basophils Absolute: 0 10*3/uL (ref 0.0–0.1)
Basophils Relative: 0 %
Eosinophils Absolute: 0 10*3/uL (ref 0.0–0.5)
Eosinophils Relative: 1 %
HCT: 29.9 % — ABNORMAL LOW (ref 36.0–46.0)
Hemoglobin: 9.5 g/dL — ABNORMAL LOW (ref 12.0–15.0)
Immature Granulocytes: 3 %
Lymphocytes Relative: 7 %
Lymphs Abs: 0.3 10*3/uL — ABNORMAL LOW (ref 0.7–4.0)
MCH: 32.4 pg (ref 26.0–34.0)
MCHC: 31.8 g/dL (ref 30.0–36.0)
MCV: 102 fL — ABNORMAL HIGH (ref 80.0–100.0)
Monocytes Absolute: 0.7 10*3/uL (ref 0.1–1.0)
Monocytes Relative: 14 %
Neutro Abs: 3.4 10*3/uL (ref 1.7–7.7)
Neutrophils Relative %: 75 %
Platelets: 219 10*3/uL (ref 150–400)
RBC: 2.93 MIL/uL — ABNORMAL LOW (ref 3.87–5.11)
RDW: 20.1 % — ABNORMAL HIGH (ref 11.5–15.5)
WBC: 4.5 10*3/uL (ref 4.0–10.5)
nRBC: 0 % (ref 0.0–0.2)

## 2020-01-09 LAB — COMPREHENSIVE METABOLIC PANEL
ALT: 30 U/L (ref 0–44)
AST: 48 U/L — ABNORMAL HIGH (ref 15–41)
Albumin: 3.5 g/dL (ref 3.5–5.0)
Alkaline Phosphatase: 229 U/L — ABNORMAL HIGH (ref 38–126)
Anion gap: 10 (ref 5–15)
BUN: 14 mg/dL (ref 6–20)
CO2: 26 mmol/L (ref 22–32)
Calcium: 8.9 mg/dL (ref 8.9–10.3)
Chloride: 104 mmol/L (ref 98–111)
Creatinine, Ser: 1.03 mg/dL — ABNORMAL HIGH (ref 0.44–1.00)
GFR calc Af Amer: >60 mL/min (ref >60)
GFR calc non Af Amer: >60 mL/min (ref >60)
Glucose, Bld: 132 mg/dL — ABNORMAL HIGH (ref 70–99)
Potassium: 3.3 mmol/L — ABNORMAL LOW (ref 3.5–5.1)
Sodium: 140 mmol/L (ref 135–145)
Total Bilirubin: 0.9 mg/dL (ref 0.3–1.2)
Total Protein: 7.1 g/dL (ref 6.5–8.1)

## 2020-01-09 MED ORDER — SODIUM CHLORIDE (PF) 0.9 % IJ SOLN
INTRAMUSCULAR | Status: AC
  Filled 2020-01-09: qty 50

## 2020-01-09 MED ORDER — IOHEXOL 300 MG/ML  SOLN
100.0000 mL | Freq: Once | INTRAMUSCULAR | Status: AC | PRN
  Administered 2020-01-09: 100 mL via INTRAVENOUS

## 2020-01-09 MED ORDER — SODIUM CHLORIDE 0.9% FLUSH
10.0000 mL | Freq: Once | INTRAVENOUS | Status: AC
  Administered 2020-01-09: 10 mL
  Filled 2020-01-09: qty 10

## 2020-01-09 MED ORDER — HEPARIN SOD (PORK) LOCK FLUSH 100 UNIT/ML IV SOLN
500.0000 [IU] | Freq: Once | INTRAVENOUS | Status: AC
  Administered 2020-01-09: 500 [IU] via INTRAVENOUS

## 2020-01-09 MED ORDER — HEPARIN SOD (PORK) LOCK FLUSH 100 UNIT/ML IV SOLN
INTRAVENOUS | Status: AC
  Filled 2020-01-09: qty 5

## 2020-01-09 NOTE — Patient Instructions (Signed)

## 2020-01-10 MED FILL — Dexamethasone Sodium Phosphate Inj 100 MG/10ML: INTRAMUSCULAR | Qty: 1 | Status: AC

## 2020-01-10 NOTE — Progress Notes (Deleted)
[place]   [phone] [phone]   Clinic Follow up Note   Patient Care Team: [name], MD as PCP - General (Family Medicine) [name], MD as Consulting Physician (Otolaryngology) [name], MD as Consulting Physician (Oncology) 01/10/2020  CHIEF COMPLAINT: F/u metastatic pancreas cancer   SUMMARY OF ONCOLOGIC HISTORY: Oncology History Overview Note  Cancer Staging Pancreatic cancer [facility]) Staging form: Exocrine Pancreas, AJCC 8th Edition - Clinical stage from 08/06/2017: Stage IV (cTX, cN0, pM1) - Signed by [name], MD on 08/12/2017     Pancreatic cancer ([place])  07/23/2017 Imaging   US abdomen limited RUQ 07/23/17 IMPRESSION: 1. Cholelithiasis.  No secondary signs of acute cholecystitis. 2. Multiple solid liver masses measuring up to 6.5 cm in the left lobe of the liver, evaluation for metastatic disease is recommended. These results will be called to the ordering clinician or representative by the Radiologist Assistant, and communication documented in the PACS or zVision Dashboard.   07/23/2017 Imaging   CT Abdomen W Contrast 07/23/17 IMPRESSION: 1. Widespread metastatic disease throughout the liver. No clear primary malignancy identified in the abdomen. The pelvis was not imaged. Tissue sampling recommended. 2. Probable adenopathy superior to the pancreatic tail. No evidence of pancreatic mass. 3. Suspected incidental hemangioma inferiorly in the right hepatic lobe. 4. Nonspecific nodularity in the breasts. The patient has undergone recent (03/25/2017 and 04/01/2017) mammography and ultrasound.   07/29/2017 Initial Diagnosis   Metastasis to liver of unknown origin ([place])   07/29/2017 PET scan   PET 07/29/17  IMPRESSION: 1. Numerous bulky liver masses are hypermetabolic compatible with malignancy. 2. Accentuated activity within or along the pancreatic tail, likely represent a primary pancreatic tumor.  Consider pancreatic protocol MRI to further work this up. 3. The peripancreatic lymph node shown above the pancreatic tail is mildly hypermetabolic favoring malignancy. 4.  Prominent stool throughout the colon favors constipation. 5. 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   08/05/2017 Pathology Results   Liver Biopsy  Diagnosis 08/05/17 Liver, needle/core biopsy - CARCINOMA. - SEE COMMENT. Microscopic Comment The malignant cells are positive for cytokeratin 7. They are negative for arginase, CDX2, cytokeratin 20, estrogen receptor, GATA-3, GCDFP, Glypican 3, Hep Par 1, Napsin A, and TTF-1. This immunohistochemical is nonspecific. Possibly primary sources include pancreatobiliary and upper gastrointestinal. Radiologic correlation is necessary. Of note, organ specific markers (GATA-3, GCDFP-breast, TTF-1, Napsin A-lung, and CDX2-colon) are negative. (JBK:ecj 08/09/2017)   08/21/2017 - 02/10/2018 Chemotherapy   FOLFIRINOX every 2 weeks starting 08/21/17. Dose reduced due to neuropahty and cytopenia    10/14/2017 Imaging   CT CAP WO Contrast 10/14/17 IMPRESSION: Evidence of known numerous liver metastases without significant interval change. No other evidence of metastatic disease within the chest, abdomen or pelvis. Cholelithiasis. Tiny pericardial effusion.   12/02/2017 Genetic Testing   Negative for pathogenic mutation.  The genes analyzed were the 83 genes on [facility]'s Multi-Cancer panel (ALK, APC, ATM, AXIN2, BAP1, BARD1, BLM, BMPR1A, BRCA1, BRCA2, BRIP1, CASR, CDC73, CDH1, CDK4, CDKN1B, CDKN1C, CDKN2A, CEBPA, CHEK2, CTNNA1, DICER1, DIS3L2, EGFR, EPCAM, FH, FLCN, GATA2, GPC3, GREM1, HOXB13, HRAS, KIT, MAX, MEN1, MET, MITF, MLH1, MSH2, MSH3, MSH6, MUTYH, NBN, NF1, NF2, NTHL1, PALB2, PDGFRA, PHOX2B, PMS2, POLD1, POLE, POT1, PRKAR1A, PTCH1, PTEN, RAD50, RAD51C, RAD51D, RB1, RECQL4, RET, RUNX1, SDHA, SDHAF2, SDHB, SDHC, SDHD, SMAD4, SMARCA4, SMARCB1, SMARCE1, STK11, SUFU, TERC, TERT,  TMEM127, TP53, TSC1, TSC2, VHL, WRN, WT1).   12/17/2017 PET scan   IMPRESSION: 1. Although the liver metastases are still visible on  the CT images, they are significantly smaller and retain no abnormal metabolic activity, consistent with response to therapy. 2. No abnormal activity within the pancreas. 3. No disease progression identified. 4. Cholelithiasis.   02/21/2018 PET scan   02/21/2018 PET Scan  IMPRESSION: 1. There are 2 new small nodules identified within the right lung which measure up to 5 mm. These are too small to reliably characterize by PET-CT, but warrant close interval follow-up. 2. Again noted are multifocal liver metastasis. The target lesion within the left lobe is slightly decreased in size when compared with the previous exam. Similar to previous exam there is no abnormal hypermetabolism above background liver activity identified within the liver lesions. 3. Gallstones.   02/23/2018 - 09/04/2018 Chemotherapy   5-FU pump infusion and liposomal irinotecan, every 2 weeks.  First cycle dose reduced due to cytopenia in the travel. Stopped on 08/25/2018 due to disease progression.    05/17/2018 Imaging   05/17/2018 PET Scan IMPRESSION: 1. Mixed response. The 2 small right lung nodules have decreased in size in the interval. Single focus of increased uptake within the liver is new from 02/21/2018 and concerning for recurrent metabolically active liver metastasis. 2. Splenomegaly.  New from previous exam. 3. Diffusely increased bone marrow activity, likely reflecting treatment related changes.   09/01/2018 PET scan   PET 09/01/18 IMPRESSION: 1. Unfortunately there recurrence of hepatic metastasis with multiple new hypermetabolic lesions in LEFT and RIGHT hepatic lobe. 2. New extra hepatic site of malignancy which appears associated the mid pancreas. Difficult to define lesion on noncontrast exam. 3. Diffuse marrow activity is favored benign. 4. No evidence of pulmonary  metastasis.   09/12/2018 - 03/27/2019 Chemotherapy   second-line Gemcitabine and Abraxane for 2 weeks on, 1 week off starting 09/12/2018. Due to neuropathy, I will start her with dose reduced Abraxane. Stopped after 03/27/19 due to disease progression.    12/14/2018 Imaging   CT CAP  IMPRESSION: CT demonstrates acute cholecystitis, with choledocholithiasis of the cystic duct.  These results were discussed by telephone at the time of interpretation on 12/14/2018 at 5:05 pm with Dr. Burr Medico.  Redemonstration of known liver metastases, with positive response to therapy, interval reduction in size of all lesions, with multiple demonstrating significant internal necrosis.  No acute finding of the chest.  Ancillary findings as above.   03/21/2019 Imaging   CT CAP W Contrast IMPRESSION: 1. Numerous rim enhancing liver metastases show overall trend towards mild progression although 1 of the index lesions is stable in the interval. 2. Interval development of portocaval lymphadenopathy concerning for disease progression. 3. Slight progression of main duct dilatation in the pancreas with abrupt cut off in the region of the body. 4. Interval development of a 9 mm subpleural nodule at the right base along the posterior hemidiaphragm. Close attention on follow-up recommended as metastatic disease not excluded. 5. New splenomegaly. 6. Cholelithiasis with diffuse gallbladder wall thickening. Gallbladder wall thickening is decreased in the interval. Stones and sludge noted in the gallbladder lumen.   04/09/2019 Imaging   CT AP W Contrast 04/09/19  IMPRESSION: 1. Multiple gallstones in lumen gallbladder without evidence acute cholecystitis. No significant change from prior. No biliary duct dilatation. Common bile duct normal. 2. Widespread hepatic metastasis unchanged from comparison CT. 3. Chronic pancreatic duct dilatation in the body and tail unchanged. 4. No bowel obstruction. 5. Small  amount of intraperitoneal free fluid unchanged.   04/10/2019 -  Chemotherapy   Third-line GTX with Xeloda 1051m BID day 1-14,  off for 1 week with Docetaxel and Gemcitbine on day 4 and day 11 every 3 weeks starting oral chemo on 04/10/19.    07/03/2019 Imaging   CT CAP W Contrast  IMPRESSION: 1. Overall stable diffuse hepatic metastatic disease. No new/progressive findings. 2. Stable to slightly larger periportal lymph node. 3. Stable CT appearance of the pancreas. Abrupt cut off of a dilated main pancreatic duct in the body/head junction region without obvious pancreatic lesion. 4. No pulmonary nodules are identified at the lung bases. 5. Stable mild splenomegaly. 6. No new/acute findings.     10/17/2019 Imaging   CT CAP W Contrast  IMPRESSION: 1. Overall mild progression of metastatic disease. Portacaval lymphadenopathy has increased. Enlarging lesion along the posterior right diaphragm is suspicious for growing right pleural metastasis. 2. Bulky liver metastatic disease is relatively stable with mild mixed changes as detailed. 3. Pancreatic body tumor is stable. 4. Moderate splenomegaly, increased. 5. Aortic Atherosclerosis (ICD10-I70.0). Additional chronic findings as detailed.       01/09/2020 Imaging   CT CHEST/ABD/PEL IMPRESSION 1. No specific evidence of metastatic disease within the chest. 2. New small right pleural effusion. 1. Relatively similar size of pancreatic primary. 2. Although direct comparison of liver metastasis is challenging secondary to differences in bolus timing, mild progression is identified. 3. Mild progression of abdominal adenopathy. 4. Persistent hepatosplenomegaly. 5. Trace perihepatic ascites, similar. 6. Borderline small bowel dilatation, without focal transition point. Ileus versus low-grade partial small bowel obstruction. 7. Cholelithiasis.   Pancreatic cancer metastasized to liver ([place])  08/11/2017 Initial Diagnosis   Pancreatic  cancer metastasized to liver [facility])     CURRENT THERAPY:  Third-line GTX with Xeloda'1000mg'$ BID day 1-14, off for 1 week with Docetaxel'30mg'$ /m2and Gemcitabine'750mg'$ /m2on day 4 and day 11 every 3 weeks starting oral chemo on 04/10/19.Starting 07/06/19 she will reduce Xeloda to '1000mg'$  in the AM and '500mg'$  in the PM due to low blood counts.  INTERVAL HISTORY: Ms. Mitten returns for f/u as scheduled. She completed last cycle GTX on 12/28/19 with granix on 01/02/20. She was seen at [facility] and had restaging CT in the interval.     REVIEW OF SYSTEMS:   Constitutional: Denies fevers, chills or abnormal weight loss Eyes: Denies blurriness of vision Ears, nose, mouth, throat, and face: Denies mucositis or sore throat Respiratory: Denies cough, dyspnea or wheezes Cardiovascular: Denies palpitation, chest discomfort or lower extremity swelling Gastrointestinal:  Denies nausea, heartburn or change in bowel habits Skin: Denies abnormal skin rashes Lymphatics: Denies new lymphadenopathy or easy bruising Neurological:Denies numbness, tingling or new weaknesses Behavioral/Psych: Mood is stable, no new changes  All other systems were reviewed with the patient and are negative.  MEDICAL HISTORY:  Past Medical History:  Diagnosis Date  . Diarrhea of presumed infectious origin 01/12/2018  . Gastroenteritis 01/12/2018  . Genetic testing 12/02/2017   Multi-Cancer panel (83 genes) @ [facility] - No pathogenic mutations detected  . Meniere disease 2016  . met pancreatic ca to liver dx'd 07/2017  . Pancreatitis     SURGICAL HISTORY: Past Surgical History:  Procedure Laterality Date  . CERVICAL ABLATION    . ENDOMETRIAL ABLATION  2011  . IR CHOLANGIOGRAM EXISTING TUBE  01/09/2019  . IR FLUORO GUIDE PORT INSERTION RIGHT  08/12/2017  . IR PERC CHOLECYSTOSTOMY  12/15/2018  . IR US GUIDE VASC ACCESS RIGHT  08/12/2017  . [place]    I have reviewed the social history and family history with the  patient and they are unchanged  from previous note.  ALLERGIES:  has No Known Allergies.  MEDICATIONS:  Current Outpatient Medications  Medication Sig Dispense Refill  . b complex vitamins tablet Take 1 tablet by mouth daily.    . capecitabine (XELODA) 500 MG tablet TAKE 2 TABLETS BY MOUTH IN THE MORNING AND 1 TABLET IN THE EVENING, EVERY 12 HOURS, WITHIN 30 MINUTES OF MEALS ON DAYS 1 TO 14 OF EACH 21 DAY CYCLE 42 tablet 3  . Cholecalciferol (VITAMIN D3 GUMMIES ADULT) 25 MCG (1000 UT) CHEW Chew by mouth. Taking 2 gummies    . cyclobenzaprine (FLEXERIL) 5 MG tablet Take 1 tablet (5 mg total) by mouth 3 (three) times daily as needed for muscle spasms. 30 tablet 0  . gabapentin (NEURONTIN) 100 MG capsule Take 1 capsule (100 mg total) by mouth 3 (three) times daily. 270 capsule 2  . lidocaine-prilocaine (EMLA) cream Apply 1 application topically as needed. 30 g 2  . loperamide (IMODIUM) 2 MG capsule Take 1 capsule (2 mg total) by mouth as needed for diarrhea or loose stools. 30 capsule 0  . loratadine (CLARITIN) 10 MG tablet Take 10 mg by mouth daily as needed for allergies.    . magic mouthwash w/lidocaine SOLN Take 10 mLs by mouth 3 (three) times daily as needed for mouth pain. Swish and spit 10 ML by mouth 3 times daily as needed for mouth pain. 240 mL 0  . meclizine (ANTIVERT) 25 MG tablet Take 25 mg by mouth 3 (three) times daily as needed for dizziness.    . ondansetron (ZOFRAN-ODT) 8 MG disintegrating tablet TAKE 1 TABLET BY MOUTH EVERY 8 HOURS AS NEEDED FOR NAUSE OR VOMITING 40 tablet 1  . potassium chloride (KLOR-CON) 10 MEQ tablet Take 1 tablet (10 mEq total) by mouth 4 (four) times daily. 360 tablet 2  . prochlorperazine (COMPAZINE) 10 MG tablet Take 1 tablet (10 mg total) by mouth every 6 (six) hours as needed for nausea or vomiting. 40 tablet 2  . PROMACTA 50 MG tablet TAKE 1 TABLET BY MOUTH 1 TIME A DAY. 30 tablet 1  . Rivaroxaban (XARELTO) 15 MG TABS tablet TAKE 1 TABLET(15 MG) BY  MOUTH DAILY 90 tablet 1  . traMADol (ULTRAM) 50 MG tablet Take 1 tablet (50 mg total) by mouth every 6 (six) hours as needed for moderate pain or severe pain. 30 tablet 0   No current facility-administered medications for this visit.   Facility-Administered Medications Ordered in Other Visits  Medication Dose Route Frequency Provider Last Rate Last Admin  . sodium chloride flush (NS) 0.9 % injection 10 mL  10 mL Intracatheter Once [name], MD        PHYSICAL EXAMINATION: ECOG PERFORMANCE STATUS: {CHL ONC ECOG NW:[id number]}  There were no vitals filed for this visit. There were no vitals filed for this visit.  GENERAL:alert, no distress and comfortable SKIN: skin color, texture, turgor are normal, no rashes or significant lesions EYES: normal, Conjunctiva are pink and non-injected, sclera clear OROPHARYNX:no exudate, no erythema and lips, buccal mucosa, and tongue normal  NECK: supple, thyroid normal size, non-tender, without nodularity LYMPH:  no palpable lymphadenopathy in the cervical, axillary or inguinal LUNGS: clear to auscultation and percussion with normal breathing effort HEART: regular rate & rhythm and no murmurs and no lower extremity edema ABDOMEN:abdomen soft, non-tender and normal bowel sounds Musculoskeletal:no cyanosis of digits and no clubbing  NEURO: alert & oriented x 3 with fluent speech, no focal motor/sensory deficits  LABORATORY DATA:  I  have reviewed the data as listed CBC Latest Ref Rng & Units 01/09/2020 12/28/2019 12/21/2019  WBC 4.0 - 10.5 K/uL 4.5 3.3(L) 6.1  Hemoglobin 12.0 - 15.0 g/dL 9.5(L) 9.8(L) 10.1(L)  Hematocrit 36.0 - 46.0 % 29.9(L) 31.5(L) 32.0(L)  Platelets 150 - 400 K/uL 219 212 476(H)     CMP Latest Ref Rng & Units 01/09/2020 12/28/2019 12/21/2019  Glucose 70 - 99 mg/dL 132(H) 151(H) 144(H)  BUN 6 - 20 mg/dL '14 15 18  '$ Creatinine 0.44 - 1.00 mg/dL 1.03(H) 0.98 1.08(H)  Sodium 135 - 145 mmol/L 140 142 139  Potassium 3.5 - 5.1 mmol/L 3.3(L)  3.5 3.9  Chloride 98 - 111 mmol/L 104 105 104  CO2 22 - 32 mmol/L '26 25 24  '$ Calcium 8.9 - 10.3 mg/dL 8.9 9.4 9.4  Total Protein 6.5 - 8.1 g/dL 7.1 7.3 7.4  Total Bilirubin 0.3 - 1.2 mg/dL 0.9 0.6 0.7  Alkaline Phos 38 - 126 U/L 229(H) 259(H) 277(H)  AST 15 - 41 U/L 48(H) 55(H) 35  ALT 0 - 44 U/L 30 41 23      RADIOGRAPHIC STUDIES: I have personally reviewed the radiological images as listed and agreed with the findings in the report. CT Chest W Contrast  Result Date: 01/09/2020 CLINICAL DATA:  Stage IV pancreatic cancer diagnosed in December of 2018. Status post chemotherapy. Recently stopped chemotherapy prior to beginning a clinical trial. EXAM: CT CHEST, ABDOMEN, AND PELVIS WITH CONTRAST TECHNIQUE: Multidetector CT imaging of the chest, abdomen and pelvis was performed following the standard protocol during bolus administration of intravenous contrast. CONTRAST:  133m OMNIPAQUE IOHEXOL 300 MG/ML  SOLN COMPARISON:  10/17/2019 FINDINGS: CT CHEST FINDINGS Cardiovascular: Right Port-A-Cath tip at high right atrium. Aortic atherosclerosis. Normal heart size with trace, likely physiologic pericardial fluid. No central pulmonary embolism, on this non-dedicated study. Mediastinum/Nodes: No supraclavicular adenopathy. No mediastinal or hilar adenopathy. Lungs/Pleura: A new small right pleural effusion. The previously questioned pleural implant is not identified, but may be obscured by fluid. Right upper lobe calcified granuloma 3 mm. No evidence of pulmonary metastasis. Musculoskeletal: No acute osseous abnormality. CT ABDOMEN PELVIS FINDINGS Hepatobiliary: Hepatomegaly at 21.4 cm. Extensive hepatic metastasis. -An index hepatic dome lesion measures 5.1 x 3.9 cm on 15/2. Compare 4.4 x 4.1 cm on the prior exam. Comparison of some index lesions on the prior is challenging secondary to differences in bolus timing today. -a lesion along the falciform ligament measures 2.8 x 2.6 cm on 46/2 versus 2.7 x 2.6 cm  on the prior exam (when remeasured). -A bilobed segment 7/8 7.7 x 5.0 cm lesion on 31/2 measured 6.4 x 3.5 cm on the prior exam (when remeasured). -An anterior segment 3 lesion measures 2.8 x 1.4 cm on 56/2 versus 2.5 x 1.7 cm on the prior exam (when remeasured). The gallbladder is contracted with small stones within. Apparent wall thickening could be artifactual in the setting of underdistention. No surrounding edema or biliary duct dilatation. Pancreas: Pancreatic tail and body duct dilatation again identified to the level of a pancreatic head/neck relatively isoenhancing mass. Example at 2.2 x 2.6 cm on 108/7. Compare 1.8 x 2.8 cm on the prior exam. No superimposed acute pancreatitis. Spleen: Splenomegaly at 15.6 cm craniocaudal. Adrenals/Urinary Tract: Normal adrenal glands. Normal kidneys, without hydronephrosis. Normal urinary bladder. Stomach/Bowel: Normal stomach, without wall thickening. Underdistended ascending colon. Normal terminal ileum. Prominent contrast filled small bowel loops at up to 2.4 cm in the right hemipelvis. No focal transition point. Vascular/Lymphatic: Aortic atherosclerosis. Patent  portal and splenic veins. Developing portosystemic collaterals suggest portal venous hypertension. Portal caval adenopathy at 2.3 x 3.1 cm on 106/7. Compare 2.4 x 2.9 cm on the prior exam. Just caudal to this, a portocaval node measures 1.4 cm on 112/7 versus 1.2 cm on the prior exam (when remeasured). Reproductive: Small uterine fibroids.  No adnexal mass. Other: Trace perihepatic ascites is similar. No free intraperitoneal air. Musculoskeletal: Bilateral L5 pars defects.  No malalignment. IMPRESSION: CT CHEST IMPRESSION 1. No specific evidence of metastatic disease within the chest. 2. New small right pleural effusion. CT ABDOMEN AND PELVIS IMPRESSION 1. Relatively similar size of pancreatic primary. 2. Although direct comparison of liver metastasis is challenging secondary to differences in bolus timing,  mild progression is identified. 3. Mild progression of abdominal adenopathy. 4. Persistent hepatosplenomegaly. 5. Trace perihepatic ascites, similar. 6. Borderline small bowel dilatation, without focal transition point. Ileus versus low-grade partial small bowel obstruction. 7. Cholelithiasis. Electronically Signed   By: Abigail Miyamoto M.D.   On: 01/09/2020 16:44   CT Abdomen Pelvis W Contrast  Result Date: 01/09/2020 CLINICAL DATA:  Stage IV pancreatic cancer diagnosed in December of 2018. Status post chemotherapy. Recently stopped chemotherapy prior to beginning a clinical trial. EXAM: CT CHEST, ABDOMEN, AND PELVIS WITH CONTRAST TECHNIQUE: Multidetector CT imaging of the chest, abdomen and pelvis was performed following the standard protocol during bolus administration of intravenous contrast. CONTRAST:  156m OMNIPAQUE IOHEXOL 300 MG/ML  SOLN COMPARISON:  10/17/2019 FINDINGS: CT CHEST FINDINGS Cardiovascular: Right Port-A-Cath tip at high right atrium. Aortic atherosclerosis. Normal heart size with trace, likely physiologic pericardial fluid. No central pulmonary embolism, on this non-dedicated study. Mediastinum/Nodes: No supraclavicular adenopathy. No mediastinal or hilar adenopathy. Lungs/Pleura: A new small right pleural effusion. The previously questioned pleural implant is not identified, but may be obscured by fluid. Right upper lobe calcified granuloma 3 mm. No evidence of pulmonary metastasis. Musculoskeletal: No acute osseous abnormality. CT ABDOMEN PELVIS FINDINGS Hepatobiliary: Hepatomegaly at 21.4 cm. Extensive hepatic metastasis. -An index hepatic dome lesion measures 5.1 x 3.9 cm on 15/2. Compare 4.4 x 4.1 cm on the prior exam. Comparison of some index lesions on the prior is challenging secondary to differences in bolus timing today. -a lesion along the falciform ligament measures 2.8 x 2.6 cm on 46/2 versus 2.7 x 2.6 cm on the prior exam (when remeasured). -A bilobed segment 7/8 7.7 x 5.0 cm  lesion on 31/2 measured 6.4 x 3.5 cm on the prior exam (when remeasured). -An anterior segment 3 lesion measures 2.8 x 1.4 cm on 56/2 versus 2.5 x 1.7 cm on the prior exam (when remeasured). The gallbladder is contracted with small stones within. Apparent wall thickening could be artifactual in the setting of underdistention. No surrounding edema or biliary duct dilatation. Pancreas: Pancreatic tail and body duct dilatation again identified to the level of a pancreatic head/neck relatively isoenhancing mass. Example at 2.2 x 2.6 cm on 108/7. Compare 1.8 x 2.8 cm on the prior exam. No superimposed acute pancreatitis. Spleen: Splenomegaly at 15.6 cm craniocaudal. Adrenals/Urinary Tract: Normal adrenal glands. Normal kidneys, without hydronephrosis. Normal urinary bladder. Stomach/Bowel: Normal stomach, without wall thickening. Underdistended ascending colon. Normal terminal ileum. Prominent contrast filled small bowel loops at up to 2.4 cm in the right hemipelvis. No focal transition point. Vascular/Lymphatic: Aortic atherosclerosis. Patent portal and splenic veins. Developing portosystemic collaterals suggest portal venous hypertension. Portal caval adenopathy at 2.3 x 3.1 cm on 106/7. Compare 2.4 x 2.9 cm on the prior exam. Just  caudal to this, a portocaval node measures 1.4 cm on 112/7 versus 1.2 cm on the prior exam (when remeasured). Reproductive: Small uterine fibroids.  No adnexal mass. Other: Trace perihepatic ascites is similar. No free intraperitoneal air. Musculoskeletal: Bilateral L5 pars defects.  No malalignment. IMPRESSION: CT CHEST IMPRESSION 1. No specific evidence of metastatic disease within the chest. 2. New small right pleural effusion. CT ABDOMEN AND PELVIS IMPRESSION 1. Relatively similar size of pancreatic primary. 2. Although direct comparison of liver metastasis is challenging secondary to differences in bolus timing, mild progression is identified. 3. Mild progression of abdominal  adenopathy. 4. Persistent hepatosplenomegaly. 5. Trace perihepatic ascites, similar. 6. Borderline small bowel dilatation, without focal transition point. Ileus versus low-grade partial small bowel obstruction. 7. Cholelithiasis. Electronically Signed   By: Abigail Miyamoto M.D.   On: 01/09/2020 16:44     ASSESSMENT & PLAN:  No problem-specific Assessment & Plan notes found for this encounter.   No orders of the defined types were placed in this encounter.  All questions were answered. The patient knows to call the clinic with any problems, questions or concerns. No barriers to learning was detected. I spent {CHL ONC TIME VISIT - LSLHT:[id number]} counseling the patient face to face. The total time spent in the appointment was {CHL ONC TIME VISIT - BWIOM:[id number]} and more than 50% was on counseling and review of test results     Morgan Feeling, NP 01/10/20

## 2020-01-11 ENCOUNTER — Telehealth: Payer: Self-pay

## 2020-01-11 ENCOUNTER — Ambulatory Visit: Payer: BLUE CROSS/BLUE SHIELD

## 2020-01-11 ENCOUNTER — Other Ambulatory Visit: Payer: BLUE CROSS/BLUE SHIELD

## 2020-01-11 ENCOUNTER — Inpatient Hospital Stay: Payer: BLUE CROSS/BLUE SHIELD | Admitting: Nurse Practitioner

## 2020-01-11 NOTE — Telephone Encounter (Signed)
Per Dr Burr Medico called [facility] and left message for Dr Hillard Danker to give Dr Burr Medico a call back  @ [phone]. Also faxed over patients CT results to Dr Hillard Danker as well as requested by Dr Burr Medico  Phone # [phone] Fax# [phone], [phone]

## 2020-01-11 NOTE — Telephone Encounter (Signed)
PROGRESS NOTE: Fax came back complete at 8:39am 01/11/20 Fax placed in bin beside nurse desk.

## 2020-01-12 ENCOUNTER — Telehealth: Payer: Self-pay | Admitting: Hematology

## 2020-01-12 NOTE — Telephone Encounter (Signed)
Scheduled appt per 5/21 sch message - pt is aware of appt date and time 

## 2020-01-12 NOTE — Telephone Encounter (Signed)
I reviewed her recent restaging CT scan and spoke with radiologist Dr. Jobe Igo and he re-measured her liver and node lesions per RECIST criteria, and compared previous scans from Nov 2020 and Feb 2021, the increase sum is 16%, so this is SD per RECIST definition. Pt is clinically stable and doing well.   I spoke with Dr. Hillard Danker at [facility] and both of [place] agree to continue current chemo regimen and repeat staging scan in 2 months, to see if she has further disease progression. He probably will still has trial slots available by then.  I called pt and discussed the above.  Patient is in agreement with the plan.  He is out of town now, will return to home this Sunday.  She plan to start Xeloda on Monday morning, and I will schedule her next chemo infusion next Thursday.  [name]  01/12/2020

## 2020-01-16 NOTE — Progress Notes (Signed)
Pharmacist Chemotherapy Monitoring - Follow Up Assessment    I verify that I have reviewed each item in the below checklist:  . Regimen for the patient is scheduled for the appropriate day and plan matches scheduled date. Marland Kitchen Appropriate non-routine labs are ordered dependent on drug ordered. . If applicable, additional medications reviewed and ordered per protocol based on lifetime cumulative doses and/or treatment regimen.   Plan for follow-up and/or issues identified: No . I-vent associated with next due treatment: No . MD and/or nursing notified: No  Morgan Dawson D 01/16/2020 8:44 AM

## 2020-01-17 NOTE — Progress Notes (Signed)
[place] OFFICE PROGRESS NOTE  [name], MD [name] [id number]  DIAGNOSIS: F/u of pancreatic cancer  Oncology History Overview Note  Cancer Staging Pancreatic cancer [facility]) Staging form: Exocrine Pancreas, AJCC 8th Edition - Clinical stage from 08/06/2017: Stage IV (cTX, cN0, pM1) - Signed by [name], MD on 08/12/2017     Pancreatic cancer ([place])  07/23/2017 Imaging   US abdomen limited RUQ 07/23/17 IMPRESSION: 1. Cholelithiasis.  No secondary signs of acute cholecystitis. 2. Multiple solid liver masses measuring up to 6.5 cm in the left lobe of the liver, evaluation for metastatic disease is recommended. These results will be called to the ordering clinician or representative by the Radiologist Assistant, and communication documented in the PACS or zVision Dashboard.   07/23/2017 Imaging   CT Abdomen W Contrast 07/23/17 IMPRESSION: 1. Widespread metastatic disease throughout the liver. No clear primary malignancy identified in the abdomen. The pelvis was not imaged. Tissue sampling recommended. 2. Probable adenopathy superior to the pancreatic tail. No evidence of pancreatic mass. 3. Suspected incidental hemangioma inferiorly in the right hepatic lobe. 4. Nonspecific nodularity in the breasts. The patient has undergone recent (03/25/2017 and 04/01/2017) mammography and ultrasound.   07/29/2017 Initial Diagnosis   Metastasis to liver of unknown origin ([place])   07/29/2017 PET scan   PET 07/29/17  IMPRESSION: 1. Numerous bulky liver masses are hypermetabolic compatible with malignancy. 2. Accentuated activity within or along the pancreatic tail, likely represent a primary pancreatic tumor. Consider pancreatic protocol MRI to further work this up. 3. The peripancreatic lymph node shown above the pancreatic tail is mildly hypermetabolic favoring malignancy. 4.  Prominent stool throughout the colon favors  constipation. 5. 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   08/05/2017 Pathology Results   Liver Biopsy  Diagnosis 08/05/17 Liver, needle/core biopsy - CARCINOMA. - SEE COMMENT. Microscopic Comment The malignant cells are positive for cytokeratin 7. They are negative for arginase, CDX2, cytokeratin 20, estrogen receptor, GATA-3, GCDFP, Glypican 3, Hep Par 1, Napsin A, and TTF-1. This immunohistochemical is nonspecific. Possibly primary sources include pancreatobiliary and upper gastrointestinal. Radiologic correlation is necessary. Of note, organ specific markers (GATA-3, GCDFP-breast, TTF-1, Napsin A-lung, and CDX2-colon) are negative. (JBK:ecj 08/09/2017)   08/21/2017 - 02/10/2018 Chemotherapy   FOLFIRINOX every 2 weeks starting 08/21/17. Dose reduced due to neuropahty and cytopenia    10/14/2017 Imaging   CT CAP WO Contrast 10/14/17 IMPRESSION: Evidence of known numerous liver metastases without significant interval change. No other evidence of metastatic disease within the chest, abdomen or pelvis. Cholelithiasis. Tiny pericardial effusion.   12/02/2017 Genetic Testing   Negative for pathogenic mutation.  The genes analyzed were the 83 genes on [facility]'s Multi-Cancer panel (ALK, APC, ATM, AXIN2, BAP1, BARD1, BLM, BMPR1A, BRCA1, BRCA2, BRIP1, CASR, CDC73, CDH1, CDK4, CDKN1B, CDKN1C, CDKN2A, CEBPA, CHEK2, CTNNA1, DICER1, DIS3L2, EGFR, EPCAM, FH, FLCN, GATA2, GPC3, GREM1, HOXB13, HRAS, KIT, MAX, MEN1, MET, MITF, MLH1, MSH2, MSH3, MSH6, MUTYH, NBN, NF1, NF2, NTHL1, PALB2, PDGFRA, PHOX2B, PMS2, POLD1, POLE, POT1, PRKAR1A, PTCH1, PTEN, RAD50, RAD51C, RAD51D, RB1, RECQL4, RET, RUNX1, SDHA, SDHAF2, SDHB, SDHC, SDHD, SMAD4, SMARCA4, SMARCB1, SMARCE1, STK11, SUFU, TERC, TERT, TMEM127, TP53, TSC1, TSC2, VHL, WRN, WT1).   12/17/2017 PET scan   IMPRESSION: 1. Although the liver metastases are still visible on the CT images, they are significantly smaller and retain no abnormal metabolic activity,  consistent with response to therapy. 2. No abnormal activity within the pancreas. 3. No disease progression identified. 4.  Cholelithiasis.   02/21/2018 PET scan   02/21/2018 PET Scan  IMPRESSION: 1. There are 2 new small nodules identified within the right lung which measure up to 5 mm. These are too small to reliably characterize by PET-CT, but warrant close interval follow-up. 2. Again noted are multifocal liver metastasis. The target lesion within the left lobe is slightly decreased in size when compared with the previous exam. Similar to previous exam there is no abnormal hypermetabolism above background liver activity identified within the liver lesions. 3. Gallstones.   02/23/2018 - 09/04/2018 Chemotherapy   5-FU pump infusion and liposomal irinotecan, every 2 weeks.  First cycle dose reduced due to cytopenia in the travel. Stopped on 08/25/2018 due to disease progression.    05/17/2018 Imaging   05/17/2018 PET Scan IMPRESSION: 1. Mixed response. The 2 small right lung nodules have decreased in size in the interval. Single focus of increased uptake within the liver is new from 02/21/2018 and concerning for recurrent metabolically active liver metastasis. 2. Splenomegaly.  New from previous exam. 3. Diffusely increased bone marrow activity, likely reflecting treatment related changes.   09/01/2018 PET scan   PET 09/01/18 IMPRESSION: 1. Unfortunately there recurrence of hepatic metastasis with multiple new hypermetabolic lesions in LEFT and RIGHT hepatic lobe. 2. New extra hepatic site of malignancy which appears associated the mid pancreas. Difficult to define lesion on noncontrast exam. 3. Diffuse marrow activity is favored benign. 4. No evidence of pulmonary metastasis.   09/12/2018 - 03/27/2019 Chemotherapy   second-line Gemcitabine and Abraxane for 2 weeks on, 1 week off starting 09/12/2018. Due to neuropathy, I will start her with dose reduced Abraxane. Stopped after 03/27/19 due to  disease progression.    12/14/2018 Imaging   CT CAP  IMPRESSION: CT demonstrates acute cholecystitis, with choledocholithiasis of the cystic duct.  These results were discussed by telephone at the time of interpretation on 12/14/2018 at 5:05 pm with Dr. Burr Medico.  Redemonstration of known liver metastases, with positive response to therapy, interval reduction in size of all lesions, with multiple demonstrating significant internal necrosis.  No acute finding of the chest.  Ancillary findings as above.   03/21/2019 Imaging   CT CAP W Contrast IMPRESSION: 1. Numerous rim enhancing liver metastases show overall trend towards mild progression although 1 of the index lesions is stable in the interval. 2. Interval development of portocaval lymphadenopathy concerning for disease progression. 3. Slight progression of main duct dilatation in the pancreas with abrupt cut off in the region of the body. 4. Interval development of a 9 mm subpleural nodule at the right base along the posterior hemidiaphragm. Close attention on follow-up recommended as metastatic disease not excluded. 5. New splenomegaly. 6. Cholelithiasis with diffuse gallbladder wall thickening. Gallbladder wall thickening is decreased in the interval. Stones and sludge noted in the gallbladder lumen.   04/09/2019 Imaging   CT AP W Contrast 04/09/19  IMPRESSION: 1. Multiple gallstones in lumen gallbladder without evidence acute cholecystitis. No significant change from prior. No biliary duct dilatation. Common bile duct normal. 2. Widespread hepatic metastasis unchanged from comparison CT. 3. Chronic pancreatic duct dilatation in the body and tail unchanged. 4. No bowel obstruction. 5. Small amount of intraperitoneal free fluid unchanged.   04/10/2019 -  Chemotherapy   Third-line GTX with Xeloda 1056m BID day 1-14, off for 1 week with Docetaxel and Gemcitbine on day 4 and day 11 every 3 weeks starting oral chemo on  04/10/19.    07/03/2019 Imaging   CT CAP  W Contrast  IMPRESSION: 1. Overall stable diffuse hepatic metastatic disease. No new/progressive findings. 2. Stable to slightly larger periportal lymph node. 3. Stable CT appearance of the pancreas. Abrupt cut off of a dilated main pancreatic duct in the body/head junction region without obvious pancreatic lesion. 4. No pulmonary nodules are identified at the lung bases. 5. Stable mild splenomegaly. 6. No new/acute findings.     10/17/2019 Imaging   CT CAP W Contrast  IMPRESSION: 1. Overall mild progression of metastatic disease. Portacaval lymphadenopathy has increased. Enlarging lesion along the posterior right diaphragm is suspicious for growing right pleural metastasis. 2. Bulky liver metastatic disease is relatively stable with mild mixed changes as detailed. 3. Pancreatic body tumor is stable. 4. Moderate splenomegaly, increased. 5. Aortic Atherosclerosis (ICD10-I70.0). Additional chronic findings as detailed.       01/09/2020 Imaging   CT CHEST/ABD/PEL IMPRESSION 1. No specific evidence of metastatic disease within the chest. 2. New small right pleural effusion. 1. Relatively similar size of pancreatic primary. 2. Although direct comparison of liver metastasis is challenging secondary to differences in bolus timing, mild progression is identified. 3. Mild progression of abdominal adenopathy. 4. Persistent hepatosplenomegaly. 5. Trace perihepatic ascites, similar. 6. Borderline small bowel dilatation, without focal transition point. Ileus versus low-grade partial small bowel obstruction. 7. Cholelithiasis.   Pancreatic cancer metastasized to liver ([place])  08/11/2017 Initial Diagnosis   Pancreatic cancer metastasized to liver [facility])     CURRENT THERAPY: Third-line GTX with Xeloda1067mBID day 1-14, off for 1 week with Docetaxel349mm2and Gemcitabine75051m2on day 4 and day 11 every 3 weeks starting oral chemo on  04/10/19.Starting 07/06/19 she will reduce Xeloda to 1000m9m the AM and 500mg7mthe PM due to low blood counts.  INTERVAL HISTORY: JanicTAYJAH LOBDELL.4 female returns to the clinic today for a follow-up visit. The patient recently had a restaging CT scan on 5/18.  Dr. Feng Burr Medicoe to Dr[facility] patient on the current chemotherapy regimen and repeat a staging CT scan in 2 months to assess for disease progression. The patient was in agreement with this plan.  She is here today for cycle #14 of her treatment with gemcitabine and docetaxel. She started Xeloda on day 1 earlier this week as scheduled.   Today, she is feeling fairly well without any concerning complaints except for needing to take her tramadol a little more frequently two days ago. She states she gets abdominal discomfort wraps around to her back and makes it challenging to get comfortable. She states tramadol works well for her and takes her pain from a 4-5/10 to a 1/10. She has not needed to take any pain medication today. She also had two episodes of emesis after meals a few days ago, which is unusual for her. However, she started to take her zofran 45 minutes before her meals which has helped. She had a "girls weekend" with some of her friends last weekend which was enjoyable for her. She is just a little but more worn out from the activity. She denies any recent fever, chills, night sweats, or weight loss.  She denies any unusual diarrhea or constipation.  She denies any chest pain, shortness of breath, cough, or hemoptysis.  She denies any recent signs and symptoms of infection including nasal congestion, sore throat, skin infections, or dysuria.  She reports that her neuropathy is stable, she takes 100 mg BID of gabapentin. She does not want to increase her dose secondary  to it causing dizziness. 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  She denies any jaundice. She  states she occasionally gets dry skin which causes itching. She uses Eucerin cream for this. She is here today for evaluation and repeat blood work before starting cycle #14.  MEDICAL HISTORY: Past Medical History:  Diagnosis Date  . Diarrhea of presumed infectious origin 01/12/2018  . Gastroenteritis 01/12/2018  . Genetic testing 12/02/2017   Multi-Cancer panel (83 genes) @ [facility] - No pathogenic mutations detected  . Meniere disease 2016  . met pancreatic ca to liver dx'd 07/2017  . Pancreatitis     ALLERGIES:  has No Known Allergies.  MEDICATIONS:  Current Outpatient Medications  Medication Sig Dispense Refill  . b complex vitamins tablet Take 1 tablet by mouth daily.    . capecitabine (XELODA) 500 MG tablet TAKE 2 TABLETS BY MOUTH IN THE MORNING AND 1 TABLET IN THE EVENING, EVERY 12 HOURS, WITHIN 30 MINUTES OF MEALS ON DAYS 1 TO 14 OF EACH 21 DAY CYCLE 42 tablet 3  . Cholecalciferol (VITAMIN D3 GUMMIES ADULT) 25 MCG (1000 UT) CHEW Chew by mouth. Taking 2 gummies    . cyclobenzaprine (FLEXERIL) 5 MG tablet Take 1 tablet (5 mg total) by mouth 3 (three) times daily as needed for muscle spasms. 30 tablet 0  . gabapentin (NEURONTIN) 100 MG capsule Take 1 capsule (100 mg total) by mouth 3 (three) times daily. 270 capsule 2  . lidocaine-prilocaine (EMLA) cream Apply 1 application topically as needed. 30 g 2  . loperamide (IMODIUM) 2 MG capsule Take 1 capsule (2 mg total) by mouth as needed for diarrhea or loose stools. 30 capsule 0  . loratadine (CLARITIN) 10 MG tablet Take 10 mg by mouth daily as needed for allergies.    . magic mouthwash w/lidocaine SOLN Take 10 mLs by mouth 3 (three) times daily as needed for mouth pain. Swish and spit 10 ML by mouth 3 times daily as needed for mouth pain. 240 mL 0  . meclizine (ANTIVERT) 25 MG tablet Take 25 mg by mouth 3 (three) times daily as needed for dizziness.    . ondansetron (ZOFRAN-ODT) 8 MG disintegrating tablet TAKE 1 TABLET BY MOUTH EVERY 8  HOURS AS NEEDED FOR NAUSE OR VOMITING 40 tablet 1  . potassium chloride (KLOR-CON) 10 MEQ tablet Take 1 tablet (10 mEq total) by mouth 4 (four) times daily. 360 tablet 2  . prochlorperazine (COMPAZINE) 10 MG tablet Take 1 tablet (10 mg total) by mouth every 6 (six) hours as needed for nausea or vomiting. 40 tablet 2  . PROMACTA 50 MG tablet TAKE 1 TABLET BY MOUTH 1 TIME A DAY. 30 tablet 1  . Rivaroxaban (XARELTO) 15 MG TABS tablet TAKE 1 TABLET(15 MG) BY MOUTH DAILY 90 tablet 1  . traMADol (ULTRAM) 50 MG tablet Take 1 tablet (50 mg total) by mouth every 6 (six) hours as needed for moderate pain or severe pain. 30 tablet 0   No current facility-administered medications for this visit.   Facility-Administered Medications Ordered in Other Visits  Medication Dose Route Frequency Provider Last Rate Last Admin  . DOCEtaxel (TAXOTERE) 40 mg in sodium chloride 0.9 % 100 mL chemo infusion  30 mg/m2 (Treatment Plan Recorded) Intravenous Once [name], MD 104 mL/hr at 01/19/20 1337 40 mg at 01/19/20 1337  . heparin lock flush 100 unit/mL  500 Units Intracatheter Once PRN [name], MD      . sodium  chloride flush (NS) 0.9 % injection 10 mL  10 mL Intracatheter Once [name], MD      . sodium chloride flush (NS) 0.9 % injection 10 mL  10 mL Intracatheter PRN [name], MD        SURGICAL HISTORY:  Past Surgical History:  Procedure Laterality Date  . CERVICAL ABLATION    . ENDOMETRIAL ABLATION  2011  . IR CHOLANGIOGRAM EXISTING TUBE  01/09/2019  . IR FLUORO GUIDE PORT INSERTION RIGHT  08/12/2017  . IR PERC CHOLECYSTOSTOMY  12/15/2018  . IR US GUIDE VASC ACCESS RIGHT  08/12/2017  . MANDIBLE SURGERY  1999    REVIEW OF SYSTEMS:   Review of Systems  Constitutional: Positive for fatigue. Negative for appetite change, chills, fever and unexpected weight change.  HENT: Negative for mouth sores, nosebleeds, sore throat and trouble swallowing.   Eyes: Negative for eye problems and icterus.  Respiratory:  Negative for cough, hemoptysis, shortness of breath and wheezing.   Cardiovascular: Negative for chest pain and leg swelling.  Gastrointestinal: Positive for intermittent abdominal pain. Positive for vomiting x2 a few days ago. Negative for constipation and diarrhea.   Genitourinary: Negative for bladder incontinence, difficulty urinating, dysuria, frequency and hematuria.   Musculoskeletal: Negative for back pain, gait problem, neck pain and neck stiffness.  Skin: Positive for dry skin and associated itching. Negative for  rash.  Neurological: Positive for occasional dizziness secondary to meniere's disease. Negative for extremity weakness, gait problem, headaches, light-headedness and seizures.  Hematological: Negative for adenopathy. Does not bruise/bleed easily.  Psychiatric/Behavioral: Negative for confusion, depression and sleep disturbance. The patient is not nervous/anxious.     PHYSICAL EXAMINATION:  Blood pressure 109/65, pulse 86, temperature 97.9 F (36.6 C), temperature source Temporal, resp. rate 18, height _0  (1.651 m), weight 105 lb 12.8 oz (48 kg), last menstrual period 10/23/2015, SpO2 100 %.  ECOG PERFORMANCE STATUS: 1 - Symptomatic but completely ambulatory  Physical Exam  Constitutional: Oriented to person, place, and time and thin appearing female and in no distress.  HENT:  Head: Normocephalic and atraumatic.  Mouth/Throat: Oropharynx is clear and moist. No oropharyngeal exudate.  Eyes: Conjunctivae are normal. Right eye exhibits no discharge. Left eye exhibits no discharge. No scleral icterus.  Neck: Normal range of motion. Neck supple.  Cardiovascular: Normal rate, regular rhythm, normal heart sounds and intact distal pulses.   Pulmonary/Chest: Effort normal and breath sounds normal. No respiratory distress. No wheezes. No rales.  Abdominal: Soft. No significant tenderness to palpation. No rebound tenderness. Negative murphy's sign. Negative Rovsing's sign. Bowel  sounds are normal. Exhibits no distension and no mass.  Musculoskeletal: Normal range of motion. Exhibits no edema.  Lymphadenopathy:    No cervical adenopathy.  Neurological: Alert and oriented to person, place, and time. Exhibits normal muscle tone. Gait normal. Coordination normal.  Skin: Skin is warm and dry. No rash noted. Not diaphoretic. No erythema. No pallor.  Psychiatric: Mood, memory and judgment normal.  Vitals reviewed.  LABORATORY DATA: Lab Results  Component Value Date   WBC 6.6 01/19/2020   HGB 8.9 (L) 01/19/2020   HCT 27.9 (L) 01/19/2020   MCV 100.0 01/19/2020   PLT 243 01/19/2020      Chemistry      Component Value Date/Time   NA 134 (L) 01/19/2020 0922   NA 134 (L) 08/23/2017 0951   K 3.7 01/19/2020 0922   K 2.5 Repeated and Verified (LL) 08/23/2017 0951   CL 98 01/19/2020 9892  CO2 26 01/19/2020 0922   CO2 26 08/23/2017 0951   BUN 16 01/19/2020 0922   BUN 11.6 08/23/2017 0951   CREATININE 1.05 (H) 01/19/2020 0922   CREATININE 1.20 (H) 01/27/2019 1501   CREATININE 1.0 08/23/2017 0951      Component Value Date/Time   CALCIUM 9.7 01/19/2020 0922   CALCIUM 10.4 08/23/2017 0951   ALKPHOS 276 (H) 01/19/2020 0922   ALKPHOS 549 (H) 08/23/2017 0951   AST 51 (H) 01/19/2020 0922   AST 47 (H) 01/27/2019 1501   AST 208 (HH) 08/23/2017 0951   ALT 23 01/19/2020 0922   ALT 46 (H) 01/27/2019 1501   ALT 151 (H) 08/23/2017 0951   BILITOT 1.5 (H) 01/19/2020 0922   BILITOT 0.6 01/27/2019 1501   BILITOT 0.82 08/23/2017 0951       RADIOGRAPHIC STUDIES:  CT Chest W Contrast  Addendum Date: 01/11/2020   ADDENDUM REPORT: 01/11/2020 13:11 ADDENDUM: Request was made to compare today's exam to the exam of greatest clinical response, 07/03/2019. RECIST 1.1 Target Lesions: 1. Hepatic dome lesion which measures 4.4 x 4.1 cm on 47/7. Compare 3.9 x 3.8 cm on 07/03/2019. 2. Inferior right hepatic lobe lesion which measures 5.0 x 4.2 cm on 82/7. Compare 4.4 x 4.0 cm on  07/03/2019 (when remeasured). 3. Portocaval node which measures 3.2 x 2.4 cm on 66/7 (when remeasured). Compare 2.6 x 1.9 cm on the 07/03/2019 exam. Non-target Lesions: 1. None Electronically Signed   By: Abigail Miyamoto M.D.   On: 01/11/2020 13:11   Result Date: 01/11/2020 CLINICAL DATA:  Stage IV pancreatic cancer diagnosed in December of 2018. Status post chemotherapy. Recently stopped chemotherapy prior to beginning a clinical trial. EXAM: CT CHEST, ABDOMEN, AND PELVIS WITH CONTRAST TECHNIQUE: Multidetector CT imaging of the chest, abdomen and pelvis was performed following the standard protocol during bolus administration of intravenous contrast. CONTRAST:  120m OMNIPAQUE IOHEXOL 300 MG/ML  SOLN COMPARISON:  10/17/2019 FINDINGS: CT CHEST FINDINGS Cardiovascular: Right Port-A-Cath tip at high right atrium. Aortic atherosclerosis. Normal heart size with trace, likely physiologic pericardial fluid. No central pulmonary embolism, on this non-dedicated study. Mediastinum/Nodes: No supraclavicular adenopathy. No mediastinal or hilar adenopathy. Lungs/Pleura: A new small right pleural effusion. The previously questioned pleural implant is not identified, but may be obscured by fluid. Right upper lobe calcified granuloma 3 mm. No evidence of pulmonary metastasis. Musculoskeletal: No acute osseous abnormality. CT ABDOMEN PELVIS FINDINGS Hepatobiliary: Hepatomegaly at 21.4 cm. Extensive hepatic metastasis. -An index hepatic dome lesion measures 5.1 x 3.9 cm on 15/2. Compare 4.4 x 4.1 cm on the prior exam. Comparison of some index lesions on the prior is challenging secondary to differences in bolus timing today. -a lesion along the falciform ligament measures 2.8 x 2.6 cm on 46/2 versus 2.7 x 2.6 cm on the prior exam (when remeasured). -A bilobed segment 7/8 7.7 x 5.0 cm lesion on 31/2 measured 6.4 x 3.5 cm on the prior exam (when remeasured). -An anterior segment 3 lesion measures 2.8 x 1.4 cm on 56/2 versus 2.5 x 1.7  cm on the prior exam (when remeasured). The gallbladder is contracted with small stones within. Apparent wall thickening could be artifactual in the setting of underdistention. No surrounding edema or biliary duct dilatation. Pancreas: Pancreatic tail and body duct dilatation again identified to the level of a pancreatic head/neck relatively isoenhancing mass. Example at 2.2 x 2.6 cm on 108/7. Compare 1.8 x 2.8 cm on the prior exam. No superimposed acute pancreatitis. Spleen: Splenomegaly at  15.6 cm craniocaudal. Adrenals/Urinary Tract: Normal adrenal glands. Normal kidneys, without hydronephrosis. Normal urinary bladder. Stomach/Bowel: Normal stomach, without wall thickening. Underdistended ascending colon. Normal terminal ileum. Prominent contrast filled small bowel loops at up to 2.4 cm in the right hemipelvis. No focal transition point. Vascular/Lymphatic: Aortic atherosclerosis. Patent portal and splenic veins. Developing portosystemic collaterals suggest portal venous hypertension. Portal caval adenopathy at 2.3 x 3.1 cm on 106/7. Compare 2.4 x 2.9 cm on the prior exam. Just caudal to this, a portocaval node measures 1.4 cm on 112/7 versus 1.2 cm on the prior exam (when remeasured). Reproductive: Small uterine fibroids.  No adnexal mass. Other: Trace perihepatic ascites is similar. No free intraperitoneal air. Musculoskeletal: Bilateral L5 pars defects.  No malalignment. IMPRESSION: CT CHEST IMPRESSION 1. No specific evidence of metastatic disease within the chest. 2. New small right pleural effusion. CT ABDOMEN AND PELVIS IMPRESSION 1. Relatively similar size of pancreatic primary. 2. Although direct comparison of liver metastasis is challenging secondary to differences in bolus timing, mild progression is identified. 3. Mild progression of abdominal adenopathy. 4. Persistent hepatosplenomegaly. 5. Trace perihepatic ascites, similar. 6. Borderline small bowel dilatation, without focal transition point. Ileus  versus low-grade partial small bowel obstruction. 7. Cholelithiasis. Electronically Signed: By: Abigail Miyamoto M.D. On: 01/09/2020 16:44   CT Abdomen Pelvis W Contrast  Addendum Date: 01/11/2020   ADDENDUM REPORT: 01/11/2020 13:11 ADDENDUM: Request was made to compare today's exam to the exam of greatest clinical response, 07/03/2019. RECIST 1.1 Target Lesions: 1. Hepatic dome lesion which measures 4.4 x 4.1 cm on 47/7. Compare 3.9 x 3.8 cm on 07/03/2019. 2. Inferior right hepatic lobe lesion which measures 5.0 x 4.2 cm on 82/7. Compare 4.4 x 4.0 cm on 07/03/2019 (when remeasured). 3. Portocaval node which measures 3.2 x 2.4 cm on 66/7 (when remeasured). Compare 2.6 x 1.9 cm on the 07/03/2019 exam. Non-target Lesions: 1. None Electronically Signed   By: Abigail Miyamoto M.D.   On: 01/11/2020 13:11   Result Date: 01/11/2020 CLINICAL DATA:  Stage IV pancreatic cancer diagnosed in December of 2018. Status post chemotherapy. Recently stopped chemotherapy prior to beginning a clinical trial. EXAM: CT CHEST, ABDOMEN, AND PELVIS WITH CONTRAST TECHNIQUE: Multidetector CT imaging of the chest, abdomen and pelvis was performed following the standard protocol during bolus administration of intravenous contrast. CONTRAST:  179m OMNIPAQUE IOHEXOL 300 MG/ML  SOLN COMPARISON:  10/17/2019 FINDINGS: CT CHEST FINDINGS Cardiovascular: Right Port-A-Cath tip at high right atrium. Aortic atherosclerosis. Normal heart size with trace, likely physiologic pericardial fluid. No central pulmonary embolism, on this non-dedicated study. Mediastinum/Nodes: No supraclavicular adenopathy. No mediastinal or hilar adenopathy. Lungs/Pleura: A new small right pleural effusion. The previously questioned pleural implant is not identified, but may be obscured by fluid. Right upper lobe calcified granuloma 3 mm. No evidence of pulmonary metastasis. Musculoskeletal: No acute osseous abnormality. CT ABDOMEN PELVIS FINDINGS Hepatobiliary: Hepatomegaly at  21.4 cm. Extensive hepatic metastasis. -An index hepatic dome lesion measures 5.1 x 3.9 cm on 15/2. Compare 4.4 x 4.1 cm on the prior exam. Comparison of some index lesions on the prior is challenging secondary to differences in bolus timing today. -a lesion along the falciform ligament measures 2.8 x 2.6 cm on 46/2 versus 2.7 x 2.6 cm on the prior exam (when remeasured). -A bilobed segment 7/8 7.7 x 5.0 cm lesion on 31/2 measured 6.4 x 3.5 cm on the prior exam (when remeasured). -An anterior segment 3 lesion measures 2.8 x 1.4 cm on 56/2 versus 2.5  x 1.7 cm on the prior exam (when remeasured). The gallbladder is contracted with small stones within. Apparent wall thickening could be artifactual in the setting of underdistention. No surrounding edema or biliary duct dilatation. Pancreas: Pancreatic tail and body duct dilatation again identified to the level of a pancreatic head/neck relatively isoenhancing mass. Example at 2.2 x 2.6 cm on 108/7. Compare 1.8 x 2.8 cm on the prior exam. No superimposed acute pancreatitis. Spleen: Splenomegaly at 15.6 cm craniocaudal. Adrenals/Urinary Tract: Normal adrenal glands. Normal kidneys, without hydronephrosis. Normal urinary bladder. Stomach/Bowel: Normal stomach, without wall thickening. Underdistended ascending colon. Normal terminal ileum. Prominent contrast filled small bowel loops at up to 2.4 cm in the right hemipelvis. No focal transition point. Vascular/Lymphatic: Aortic atherosclerosis. Patent portal and splenic veins. Developing portosystemic collaterals suggest portal venous hypertension. Portal caval adenopathy at 2.3 x 3.1 cm on 106/7. Compare 2.4 x 2.9 cm on the prior exam. Just caudal to this, a portocaval node measures 1.4 cm on 112/7 versus 1.2 cm on the prior exam (when remeasured). Reproductive: Small uterine fibroids.  No adnexal mass. Other: Trace perihepatic ascites is similar. No free intraperitoneal air. Musculoskeletal: Bilateral L5 pars defects.  No  malalignment. IMPRESSION: CT CHEST IMPRESSION 1. No specific evidence of metastatic disease within the chest. 2. New small right pleural effusion. CT ABDOMEN AND PELVIS IMPRESSION 1. Relatively similar size of pancreatic primary. 2. Although direct comparison of liver metastasis is challenging secondary to differences in bolus timing, mild progression is identified. 3. Mild progression of abdominal adenopathy. 4. Persistent hepatosplenomegaly. 5. Trace perihepatic ascites, similar. 6. Borderline small bowel dilatation, without focal transition point. Ileus versus low-grade partial small bowel obstruction. 7. Cholelithiasis. Electronically Signed: By: Abigail Miyamoto M.D. On: 01/09/2020 16:44     ASSESSMENT/PLAN:  Morgan Dawson is a 59 y.o. female with    1. 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 She progressed on this based on 08/2018 PET scan. -She mildly progressed onsecond-line Gemcitabine and Abraxanein liver and new lung nodule.  -She is currently onthird-line GTX with Xeloda 1040m BID 2 weeks on/1 week off andDocetaxelandGemcitabineon day 4 and 11 q3weeks starting 04/10/19.Granix has been added to week 3 due toneutropenia. Starting 07/03/19 Xeloda has been decreased to10067min the AM and 50045mn the PMdue tocytopenias.  -HerCT CAP from 2/23/21showedoverall mild progression of metastatic disease. Her last Ca 19.9 continues to fluctuate, but overallstable. -Dr. FenRaynelle Fanningt [facility]. Right now, [facility], one phase 1 (open) and one phase 2 (upcoming soon). Dr. FenBurr Medicoso had reached out to Dr. [facility]RAleatha Borer [facility]. Dr. FenBurr Medicod the patient discussed the washout time before she can  participate clinical trial, which is usually 4 weeks. They discussed high screening failure for clinical trial, and high possibility of disease progression while she is off treatment and waiting to participate clinical trial. She voiced good understanding. -Dr. FenBurr Medicommunicated withDr. [facility]Denn[facility] not eligible for their trial, she recommended testing NRG1 fusion to see if she is a candidate for targeted therapy. -FO also listed some targeted therapy options based on her RAF mutation,such as regorafenib, sorafenib, MEK inhibitor, Dr. FenBurr Medicooked into the clinical data to see if we can use one of them. -After  cycle #13, the patient had a restaging CT scan. Dr. Burr Medico discusused the patient's case with Dr. Dr. Hillard Danker at [facility]. They both agreed to continue on the current chemo regimen and repeat scan in 2 months (~July 2021).  -In the meantime will continue GTX withXeloda1036m in the AM and 5087min the PMandday 4Docetaxel/Gemcitabine -Labs reviewed today (01/19/2020), hg 8.9, plt 243K, BG 162, Cr 1.05, albumin 3.2, Alk Phos 276. Bilirubin slightly elevated today. Patient well appearing and afebrile. No abdominal pain or N/V today. Discussed with Dr. FeBurr Medicowho recommended that she  proceed with C14D4 GT today.  -Follow up in 3 weeks with cycle #15  2.Abdominal pain -Continue tramadol PRN for pain. No fever recently.   3. Hyperbilirubinemia.  -bilirubin slightly elevated at 1.5 today -Patient afebrile, unremarkable abdominal exam, vitals unremarkable. Patient is well appearing today. Denies significant abdominal pain today. Denies any more vomiting.  -Discussed with Dr. FeBurr MedicoWe will continue to monitor -Discussed with patient concerning symptoms that would warrant medical evaluation. Discussed that she should seek medical evaluation for new or worsening symptoms of persistent/worsening abdominal pain, N/V, fevers, jaundice, itching, rapid pulse, abdominal tenderness/swelling,  etc. She expressed understanding.    4.Malnutrition -Due to chemo and underlying metastatic cancer. -Continue nutritional supplement and f/u with dietician. -Appetite and eatinghas been fair but she has been able to maintain weight well.Stable.  5. Transaminitis -Due to underlying chemo and malignancy -Resolved except Alk Phos at 276 today (01/19/20)  6. Pancytopenia, secondary to chemo -Required chemo dose reductions. -S/p blood transfusion as needed withHg<8. Last on 09/20/18 -OnPromacta, willcontinue. Due to worsening thrombocytopenia from chemo, increasedto 5057maily starting 05/31/19.May hold when she is off chemo -Granix has been added tothirdweek and Xeloda dose reduced starting 07/03/19 -She is responding well to Granix.   7. Hypokalemia, CKD Stage III -Sheis currently on KCL10m39mID -Has been stable.  8.Peripheral neuropathy, grade 2  -Currently on Neurontin 200 mg at night and Bcomplex.She used ice bags during infusion. -Mild and stable in hands and has mildly progressed in feet with current chemo. Will continue to monitor.Stable.  9.H/o Acute cholecystitis, status post cholecystectomy tube placement 4/23/20per IR -She was seen by surgery during herhospitalization, surgery was not recommended due to high risk of complication, and concerns for cancer progression when holding chemotherapy for surgery -Drain intact,currently has decreased to 24ml39mghtlybloody drainage per day -afebrileand no chills. -Her CT CAP from 12/14/18 shows she has numerus gallstones. Will monitor for any inflammation. -Imaging in 12/2018 consistent with cholecystitis with cystic duct obstruction. No leak on HIDA. -She waspreviouslygiven prophylactic 5-day course levaquin on 04/13/19 due to fever spikes during chemo -noevidence of recurrent infectionrecently.  9. LE DVT -Diagnosed on December 15, 2018, when she was hospitalized for cholecystitis -Due to her  bloody biliary drainage, and moderate thrombocytopenia from chemotherapy, she is on low-doseXarelto15mg 17my (no loading dose), she is tolerating well and will continue  10.Goal of care discussion -She is full code now -She understands her cancer is incurable at this stage, and the goal of therapy is palliative, to prolong his life, and prevent cancer related symptoms  Plan -She started C14 Xeloda on 01/16/20, will continue for 14 days. -Labs reviewed with Dr. Feng aBurr Medicodequate to proceed with chemo gemcitabine and docetaxel today, she will return next week for day 11 treatment. -Patient would like to start moving her treatment to Wednesdays. Will arrange for cycle #15 to be on Thursday and Cycle #16 on a Wednesday.  -Reviewed that  if the patient develops new or worsening symptoms such as fevers, worsening abdominal pain, jaundice, abdominal tenderness, N/V, tachycardia, etc to seek immediate medical evaluation.    No orders of the defined types were placed in this encounter.    Kiernan Farkas L Yuritzy Zehring, PA-C 01/19/20

## 2020-01-18 ENCOUNTER — Other Ambulatory Visit: Payer: BLUE CROSS/BLUE SHIELD

## 2020-01-18 ENCOUNTER — Ambulatory Visit: Payer: BLUE CROSS/BLUE SHIELD

## 2020-01-18 ENCOUNTER — Ambulatory Visit: Payer: BLUE CROSS/BLUE SHIELD | Admitting: Hematology

## 2020-01-19 ENCOUNTER — Encounter: Payer: Self-pay | Admitting: Physician Assistant

## 2020-01-19 ENCOUNTER — Inpatient Hospital Stay: Payer: BLUE CROSS/BLUE SHIELD

## 2020-01-19 ENCOUNTER — Inpatient Hospital Stay (HOSPITAL_BASED_OUTPATIENT_CLINIC_OR_DEPARTMENT_OTHER): Payer: BLUE CROSS/BLUE SHIELD | Admitting: Physician Assistant

## 2020-01-19 ENCOUNTER — Other Ambulatory Visit: Payer: Self-pay

## 2020-01-19 VITALS — BP 109/65 | HR 86 | Temp 97.9°F | Resp 18 | Ht 65.0 in | Wt 105.8 lb

## 2020-01-19 DIAGNOSIS — C251 Malignant neoplasm of body of pancreas: Secondary | ICD-10-CM

## 2020-01-19 DIAGNOSIS — Z7189 Other specified counseling: Secondary | ICD-10-CM

## 2020-01-19 DIAGNOSIS — Z5111 Encounter for antineoplastic chemotherapy: Secondary | ICD-10-CM

## 2020-01-19 DIAGNOSIS — C787 Secondary malignant neoplasm of liver and intrahepatic bile duct: Secondary | ICD-10-CM | POA: Diagnosis not present

## 2020-01-19 LAB — COMPREHENSIVE METABOLIC PANEL
ALT: 23 U/L (ref 0–44)
AST: 51 U/L — ABNORMAL HIGH (ref 15–41)
Albumin: 3.2 g/dL — ABNORMAL LOW (ref 3.5–5.0)
Alkaline Phosphatase: 276 U/L — ABNORMAL HIGH (ref 38–126)
Anion gap: 10 (ref 5–15)
BUN: 16 mg/dL (ref 6–20)
CO2: 26 mmol/L (ref 22–32)
Calcium: 9.7 mg/dL (ref 8.9–10.3)
Chloride: 98 mmol/L (ref 98–111)
Creatinine, Ser: 1.05 mg/dL — ABNORMAL HIGH (ref 0.44–1.00)
GFR calc Af Amer: >60 mL/min (ref >60)
GFR calc non Af Amer: 59 mL/min — ABNORMAL LOW (ref >60)
Glucose, Bld: 162 mg/dL — ABNORMAL HIGH (ref 70–99)
Potassium: 3.7 mmol/L (ref 3.5–5.1)
Sodium: 134 mmol/L — ABNORMAL LOW (ref 135–145)
Total Bilirubin: 1.5 mg/dL — ABNORMAL HIGH (ref 0.3–1.2)
Total Protein: 6.9 g/dL (ref 6.5–8.1)

## 2020-01-19 LAB — CBC WITH DIFFERENTIAL/PLATELET
Abs Immature Granulocytes: 0.03 10*3/uL (ref 0.00–0.07)
Basophils Absolute: 0 10*3/uL (ref 0.0–0.1)
Basophils Relative: 1 %
Eosinophils Absolute: 0 10*3/uL (ref 0.0–0.5)
Eosinophils Relative: 0 %
HCT: 27.9 % — ABNORMAL LOW (ref 36.0–46.0)
Hemoglobin: 8.9 g/dL — ABNORMAL LOW (ref 12.0–15.0)
Immature Granulocytes: 1 %
Lymphocytes Relative: 6 %
Lymphs Abs: 0.4 10*3/uL — ABNORMAL LOW (ref 0.7–4.0)
MCH: 31.9 pg (ref 26.0–34.0)
MCHC: 31.9 g/dL (ref 30.0–36.0)
MCV: 100 fL (ref 80.0–100.0)
Monocytes Absolute: 0.7 10*3/uL (ref 0.1–1.0)
Monocytes Relative: 11 %
Neutro Abs: 5.5 10*3/uL (ref 1.7–7.7)
Neutrophils Relative %: 81 %
Platelets: 243 10*3/uL (ref 150–400)
RBC: 2.79 MIL/uL — ABNORMAL LOW (ref 3.87–5.11)
RDW: 18.2 % — ABNORMAL HIGH (ref 11.5–15.5)
WBC: 6.6 10*3/uL (ref 4.0–10.5)
nRBC: 0 % (ref 0.0–0.2)

## 2020-01-19 MED ORDER — SODIUM CHLORIDE 0.9 % IV SOLN
10.0000 mg | Freq: Once | INTRAVENOUS | Status: AC
  Administered 2020-01-19: 10 mg via INTRAVENOUS
  Filled 2020-01-19: qty 10

## 2020-01-19 MED ORDER — HEPARIN SOD (PORK) LOCK FLUSH 100 UNIT/ML IV SOLN
500.0000 [IU] | Freq: Once | INTRAVENOUS | Status: AC | PRN
  Administered 2020-01-19: 500 [IU]
  Filled 2020-01-19: qty 5

## 2020-01-19 MED ORDER — SODIUM CHLORIDE 0.9 % IV SOLN
Freq: Once | INTRAVENOUS | Status: AC
  Filled 2020-01-19: qty 250

## 2020-01-19 MED ORDER — SODIUM CHLORIDE 0.9% FLUSH
10.0000 mL | INTRAVENOUS | Status: DC | PRN
  Administered 2020-01-19: 10 mL
  Filled 2020-01-19: qty 10

## 2020-01-19 MED ORDER — SODIUM CHLORIDE 0.9 % IV SOLN
30.0000 mg/m2 | Freq: Once | INTRAVENOUS | Status: AC
  Administered 2020-01-19: 40 mg via INTRAVENOUS
  Filled 2020-01-19: qty 4

## 2020-01-19 MED ORDER — SODIUM CHLORIDE 0.9 % IV SOLN
600.0000 mg/m2 | Freq: Once | INTRAVENOUS | Status: AC
  Administered 2020-01-19: 912 mg via INTRAVENOUS
  Filled 2020-01-19: qty 23.99

## 2020-01-19 NOTE — Patient Instructions (Signed)
[facility] Discharge Instructions for Patients Receiving Chemotherapy  Today you received the following chemotherapy agents Gemzar,Taxotere To help prevent nausea and vomiting after your treatment, we encourage you to take your nausea medication as directed  If you develop nausea and vomiting that is not controlled by your nausea medication, call the clinic.   BELOW ARE SYMPTOMS THAT SHOULD BE REPORTED IMMEDIATELY:  *FEVER GREATER THAN 100.5 F  *CHILLS WITH OR WITHOUT FEVER  NAUSEA AND VOMITING THAT IS NOT CONTROLLED WITH YOUR NAUSEA MEDICATION  *UNUSUAL SHORTNESS OF BREATH  *UNUSUAL BRUISING OR BLEEDING  TENDERNESS IN MOUTH AND THROAT WITH OR WITHOUT PRESENCE OF ULCERS  *URINARY PROBLEMS  *BOWEL PROBLEMS  UNUSUAL RASH Items with * indicate a potential emergency and should be followed up as soon as possible.  Feel free to call the clinic should you have any questions or concerns. The clinic phone number is [phone].  Please show the CHEMO ALERT CARD at check-in to the Emergency Department and triage nurse.   

## 2020-01-19 NOTE — Progress Notes (Signed)
Pharmacist Chemotherapy Monitoring - Follow Up Assessment    I verify that I have reviewed each item in the below checklist:  . Regimen for the patient is scheduled for the appropriate day and plan matches scheduled date. Marland Kitchen Appropriate non-routine labs are ordered dependent on drug ordered. . If applicable, additional medications reviewed and ordered per protocol based on lifetime cumulative doses and/or treatment regimen.   Plan for follow-up and/or issues identified: No . I-vent associated with next due treatment: No . MD and/or nursing notified: No  Anyelin Mogle D 01/19/2020 12:17 PM

## 2020-01-21 ENCOUNTER — Other Ambulatory Visit: Payer: Self-pay

## 2020-01-21 ENCOUNTER — Encounter (HOSPITAL_COMMUNITY): Payer: Self-pay | Admitting: Emergency Medicine

## 2020-01-21 ENCOUNTER — Emergency Department (HOSPITAL_COMMUNITY): Payer: BLUE CROSS/BLUE SHIELD

## 2020-01-21 ENCOUNTER — Emergency Department (HOSPITAL_COMMUNITY)
Admission: EM | Admit: 2020-01-21 | Discharge: 2020-01-21 | Disposition: A | Discharge status: Home / Self Care | Payer: BLUE CROSS/BLUE SHIELD | Attending: Emergency Medicine | Admitting: Emergency Medicine

## 2020-01-21 ENCOUNTER — Encounter: Payer: Self-pay | Admitting: Hematology

## 2020-01-21 DIAGNOSIS — R1084 Generalized abdominal pain: Secondary | ICD-10-CM | POA: Diagnosis present

## 2020-01-21 DIAGNOSIS — C259 Malignant neoplasm of pancreas, unspecified: Secondary | ICD-10-CM | POA: Diagnosis not present

## 2020-01-21 DIAGNOSIS — C787 Secondary malignant neoplasm of liver and intrahepatic bile duct: Secondary | ICD-10-CM | POA: Insufficient documentation

## 2020-01-21 DIAGNOSIS — Z79899 Other long term (current) drug therapy: Secondary | ICD-10-CM | POA: Insufficient documentation

## 2020-01-21 DIAGNOSIS — Z7901 Long term (current) use of anticoagulants: Secondary | ICD-10-CM | POA: Diagnosis not present

## 2020-01-21 DIAGNOSIS — N183 Chronic kidney disease, stage 3 unspecified: Secondary | ICD-10-CM | POA: Diagnosis not present

## 2020-01-21 DIAGNOSIS — R509 Fever, unspecified: Secondary | ICD-10-CM | POA: Diagnosis not present

## 2020-01-21 LAB — CBC WITH DIFFERENTIAL/PLATELET
Abs Immature Granulocytes: 0.15 10*3/uL — ABNORMAL HIGH (ref 0.00–0.07)
Basophils Absolute: 0 10*3/uL (ref 0.0–0.1)
Basophils Relative: 0 %
Eosinophils Absolute: 0 10*3/uL (ref 0.0–0.5)
Eosinophils Relative: 0 %
HCT: 25.2 % — ABNORMAL LOW (ref 36.0–46.0)
Hemoglobin: 7.8 g/dL — ABNORMAL LOW (ref 12.0–15.0)
Immature Granulocytes: 2 %
Lymphocytes Relative: 2 %
Lymphs Abs: 0.2 10*3/uL — ABNORMAL LOW (ref 0.7–4.0)
MCH: 31.5 pg (ref 26.0–34.0)
MCHC: 31 g/dL (ref 30.0–36.0)
MCV: 101.6 fL — ABNORMAL HIGH (ref 80.0–100.0)
Monocytes Absolute: 0.1 10*3/uL (ref 0.1–1.0)
Monocytes Relative: 1 %
Neutro Abs: 7.7 10*3/uL (ref 1.7–7.7)
Neutrophils Relative %: 95 %
Platelets: 193 10*3/uL (ref 150–400)
RBC: 2.48 MIL/uL — ABNORMAL LOW (ref 3.87–5.11)
RDW: 18.4 % — ABNORMAL HIGH (ref 11.5–15.5)
WBC: 8.1 10*3/uL (ref 4.0–10.5)
nRBC: 0 % (ref 0.0–0.2)

## 2020-01-21 LAB — COMPREHENSIVE METABOLIC PANEL
ALT: 23 U/L (ref 0–44)
AST: 47 U/L — ABNORMAL HIGH (ref 15–41)
Albumin: 2.8 g/dL — ABNORMAL LOW (ref 3.5–5.0)
Alkaline Phosphatase: 202 U/L — ABNORMAL HIGH (ref 38–126)
Anion gap: 10 (ref 5–15)
BUN: 19 mg/dL (ref 6–20)
CO2: 26 mmol/L (ref 22–32)
Calcium: 8 mg/dL — ABNORMAL LOW (ref 8.9–10.3)
Chloride: 96 mmol/L — ABNORMAL LOW (ref 98–111)
Creatinine, Ser: 0.91 mg/dL (ref 0.44–1.00)
GFR calc Af Amer: >60 mL/min (ref >60)
GFR calc non Af Amer: >60 mL/min (ref >60)
Glucose, Bld: 110 mg/dL — ABNORMAL HIGH (ref 70–99)
Potassium: 3.9 mmol/L (ref 3.5–5.1)
Sodium: 132 mmol/L — ABNORMAL LOW (ref 135–145)
Total Bilirubin: 1.7 mg/dL — ABNORMAL HIGH (ref 0.3–1.2)
Total Protein: 5.8 g/dL — ABNORMAL LOW (ref 6.5–8.1)

## 2020-01-21 LAB — URINALYSIS, ROUTINE W REFLEX MICROSCOPIC
Bilirubin Urine: NEGATIVE
Glucose, UA: NEGATIVE mg/dL
Ketones, ur: NEGATIVE mg/dL
Leukocytes,Ua: NEGATIVE
Nitrite: NEGATIVE
Protein, ur: 100 mg/dL — AB
Specific Gravity, Urine: 1.026 (ref 1.005–1.030)
pH: 5 (ref 5.0–8.0)

## 2020-01-21 LAB — LIPASE, BLOOD: Lipase: 64 U/L — ABNORMAL HIGH (ref 11–51)

## 2020-01-21 LAB — LACTIC ACID, PLASMA: Lactic Acid, Venous: 1.1 mmol/L (ref 0.5–1.9)

## 2020-01-21 MED ORDER — IOHEXOL 9 MG/ML PO SOLN
ORAL | Status: AC
  Filled 2020-01-21: qty 1000

## 2020-01-21 MED ORDER — HEPARIN SOD (PORK) LOCK FLUSH 100 UNIT/ML IV SOLN
500.0000 [IU] | Freq: Once | INTRAVENOUS | Status: AC
  Administered 2020-01-21: 500 [IU]
  Filled 2020-01-21: qty 5

## 2020-01-21 MED ORDER — SODIUM CHLORIDE (PF) 0.9 % IJ SOLN
INTRAMUSCULAR | Status: AC
  Filled 2020-01-21: qty 50

## 2020-01-21 MED ORDER — IOHEXOL 9 MG/ML PO SOLN
500.0000 mL | ORAL | Status: AC
  Administered 2020-01-21: 500 mL via ORAL

## 2020-01-21 MED ORDER — KETOROLAC TROMETHAMINE 30 MG/ML IJ SOLN
30.0000 mg | Freq: Once | INTRAMUSCULAR | Status: AC
  Administered 2020-01-21: 30 mg via INTRAVENOUS
  Filled 2020-01-21: qty 1

## 2020-01-21 MED ORDER — LACTATED RINGERS IV BOLUS
1000.0000 mL | Freq: Once | INTRAVENOUS | Status: AC
  Administered 2020-01-21: 1000 mL via INTRAVENOUS

## 2020-01-21 MED ORDER — SODIUM CHLORIDE 0.9% FLUSH: 3.0000 mL | Freq: Once | INTRAVENOUS | Status: DC

## 2020-01-21 MED ORDER — IOHEXOL 300 MG/ML  SOLN
100.0000 mL | Freq: Once | INTRAMUSCULAR | Status: AC | PRN
  Administered 2020-01-21: 80 mL via INTRAVENOUS

## 2020-01-21 NOTE — ED Provider Notes (Signed)
[facility] DEPT Provider Note   CSN: [id number] Arrival date & time: 01/21/20  1259     History Chief Complaint  Patient presents with  . Fever  . Abdominal Pain    Morgan Dawson is a 59 y.o. female with history of pancreatic cancer with metastases, CKD presents for evaluation of acute onset, persistent fever for 3 days and abdominal pain for 2 days.  He reports that she underwent her chemotherapy treatment on Friday 3 days ago and subsequently developed a fever over 68 F which has been intermittent since Friday, mostly controlled with Tylenol.  She reports that she typically develops a fever a couple of days after her chemotherapy treatment so it is unusual that it started same day.  She states that it also typically only last for 1 day so the fact that she has had a fever for 3 days in a row is unusual for her.  Yesterday she developed abdominal pains mostly midline radiating to the left side.  She states that at rest the pain is mild and does not bother her but when she ambulates or with coughing, palpation, or certain movements she will experience worsening pain.  She has had nausea but no vomiting since her chemotherapy treatment but she did have some nausea and vomiting on Wednesday and Thursday of last week.  She denies shortness of breath or chest pain.  She denies urinary symptoms, diarrhea, constipation, melena or hematochezia.  She did note her urine today was darker than usual but she states that she has been trying to stay hydrated.  She contacted her oncologist who recommended presentation to the ED for further evaluation and management.  The history is provided by the patient.       Past Medical History:  Diagnosis Date  . Diarrhea of presumed infectious origin 01/12/2018  . Gastroenteritis 01/12/2018  . Genetic testing 12/02/2017   Multi-Cancer panel (83 genes) @ [facility] - No pathogenic mutations detected  . Meniere disease 2016  . met  pancreatic ca to liver dx'd 07/2017  . Pancreatitis     Patient Active Problem List   Diagnosis Date Noted  . Encounter for antineoplastic chemotherapy 01/19/2020  . Fever 01/28/2019  . Liver metastases - presumed pancreatic cancer primary 12/14/2018  . Cholelithiasis - calcified gallstones 12/14/2018  . Acute cholecystitis s/p Percutaneous cholecystostomy 12/15/2018 12/14/2018  . On antineoplastic chemotherapy 12/14/2018  . Inguinal hernia, left 12/14/2018  . Leg edema, left 12/14/2018  . Hypokalemia 01/12/2018  . Hypomagnesemia 01/12/2018  . Thrombocytopenia ([place]) 01/12/2018  . Febrile neutropenia ([place]) 01/12/2018  . CKD (chronic kidney disease), stage III 11/14/2017  . Anemia due to antineoplastic chemotherapy 11/14/2017  . Port-A-Cath in place 09/21/2017  . Pancreatic cancer metastasized to liver ([place]) 08/11/2017  . Goals of care, counseling/discussion 08/11/2017  . Pancreatic cancer ([place]) 07/29/2017  . Hemangioma of liver, right lobe segment 5 12/13/2016  . Vertigo 10/21/2015  . Meniere's disease of right ear 10/21/2015  . 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 CERVICAL ABLATION    . ENDOMETRIAL ABLATION  2011  . IR CHOLANGIOGRAM EXISTING TUBE  01/09/2019  . IR FLUORO GUIDE PORT INSERTION RIGHT  08/12/2017  . IR PERC CHOLECYSTOSTOMY  12/15/2018  . IR US GUIDE VASC ACCESS RIGHT  08/12/2017  . MANDIBLE SURGERY  1999     OB History   No obstetric history on file.  Family History  Problem Relation Age of Onset  . Colon cancer Father 35       currently 80  . Colon cancer Maternal Grandfather   . Breast cancer Cousin   . Thyroid cancer Mother 36       facial radiation for acne as teen  . Lymphoma Paternal Aunt 28       deceased 46  . Breast cancer Paternal Aunt     Social History   Tobacco Use  . Smoking status: Never Smoker  . Smokeless tobacco: Never Used  Substance Use Topics  .  Alcohol use: No  . Drug use: No    Home Medications Prior to Admission medications   Medication Sig Start Date End Date Taking? Authorizing Provider  b complex vitamins tablet Take 1 tablet by mouth daily.   Yes [provider]  capecitabine (XELODA) 500 MG tablet TAKE 2 TABLETS BY MOUTH IN THE MORNING AND 1 TABLET IN THE EVENING, EVERY 12 HOURS, WITHIN 30 MINUTES OF MEALS ON DAYS 1 TO 14 OF EACH 21 DAY CYCLE Patient taking differently: 500-1,000 mg See admin instructions. Takes 2 tablets by mouth in the morning and 1 tablet in the ebvening, every 12 hours, within 30 minutes of meals on days 1 to 14 of each 21 day cycle. 11/29/19  Yes [name], MD  Cholecalciferol (VITAMIN D3 GUMMIES ADULT) 25 MCG (1000 UT) CHEW Chew 2,000 Units by mouth daily.    Yes [provider]  cyclobenzaprine (FLEXERIL) 5 MG tablet Take 1 tablet (5 mg total) by mouth 3 (three) times daily as needed for muscle spasms. 03/06/19  Yes Alla Feeling, NP  gabapentin (NEURONTIN) 100 MG capsule Take 1 capsule (100 mg total) by mouth 3 (three) times daily. Patient taking differently: Take 100 mg by mouth in the morning and at bedtime.  11/08/19  Yes Alla Feeling, NP  lidocaine-prilocaine (EMLA) cream Apply 1 application topically as needed. Patient taking differently: Apply 1 application topically as needed (for pain).  05/31/19  Yes [name], MD  loperamide (IMODIUM) 2 MG capsule Take 1 capsule (2 mg total) by mouth as needed for diarrhea or loose stools. 01/15/18  Yes Lavina Hamman, MD  loratadine (CLARITIN) 10 MG tablet Take 10 mg by mouth daily as needed for allergies.   Yes [provider]  meclizine (ANTIVERT) 25 MG tablet Take 25 mg by mouth 3 (three) times daily as needed for dizziness.   Yes [provider]  ondansetron (ZOFRAN-ODT) 8 MG disintegrating tablet TAKE 1 TABLET BY MOUTH EVERY 8 HOURS AS NEEDED FOR NAUSE OR VOMITING Patient taking differently: Take 8 mg by mouth every 8  (eight) hours as needed for nausea or vomiting.  10/18/19  Yes [name], MD  potassium chloride (KLOR-CON) 10 MEQ tablet Take 1 tablet (10 mEq total) by mouth 4 (four) times daily. 09/27/19  Yes [name], MD  PROMACTA 50 MG tablet TAKE 1 TABLET BY MOUTH 1 TIME A DAY. Patient taking differently: Take 50 mg by mouth daily.  11/30/19  Yes [name], MD  Rivaroxaban (XARELTO) 15 MG TABS tablet TAKE 1 TABLET(15 MG) BY MOUTH DAILY Patient taking differently: Take 15 mg by mouth daily.  09/27/19  Yes [name], MD  traMADol (ULTRAM) 50 MG tablet Take 1 tablet (50 mg total) by mouth every 6 (six) hours as needed for moderate pain or severe pain. 12/22/18  Yes Alla Feeling, NP  magic mouthwash w/lidocaine SOLN Take 10  mLs by mouth 3 (three) times daily as needed for mouth pain. Swish and spit 10 ML by mouth 3 times daily as needed for mouth pain. Patient not taking: Reported on 01/21/2020 08/31/17   Alla Feeling, NP  prochlorperazine (COMPAZINE) 10 MG tablet Take 1 tablet (10 mg total) by mouth every 6 (six) hours as needed for nausea or vomiting. Patient not taking: Reported on 01/21/2020 03/31/19   [name], MD    Allergies    Patient has no known allergies.  Review of Systems   Review of Systems  Constitutional: Positive for chills, fatigue and fever.  Respiratory: Negative for shortness of breath.   Cardiovascular: Negative for chest pain.  Gastrointestinal: Positive for abdominal pain. Negative for constipation, diarrhea, nausea and vomiting (resolved).  Genitourinary: Negative for dysuria, frequency, hematuria and urgency.  All other systems reviewed and are negative.   Physical Exam Updated Vital Signs BP (!) 98/58 (BP Location: Left Arm)   Pulse 98   Temp 100.2 F (37.9 C) (Oral)   Resp 14   LMP 10/23/2015   SpO2 96%   Physical Exam Vitals and nursing note reviewed.  Constitutional:      General: She is not in acute distress.    Appearance: She is well-developed.  HENT:     Head:  Normocephalic and atraumatic.  Eyes:     General:        Right eye: No discharge.        Left eye: No discharge.     Conjunctiva/sclera: Conjunctivae normal.  Neck:     Vascular: No JVD.     Trachea: No tracheal deviation.  Cardiovascular:     Rate and Rhythm: Regular rhythm. Tachycardia present.  Pulmonary:     Effort: Pulmonary effort is normal.     Breath sounds: Normal breath sounds.  Abdominal:     General: Abdomen is flat. There is no distension.     Palpations: Abdomen is soft.     Tenderness: There is abdominal tenderness in the epigastric area, periumbilical area, suprapubic area, left upper quadrant and left lower quadrant. There is no right CVA tenderness, left CVA tenderness, guarding or rebound.  Skin:    General: Skin is warm and dry.     Findings: No erythema.  Neurological:     Mental Status: She is alert.  Psychiatric:        Behavior: Behavior normal.     ED Results / Procedures / Treatments   Labs (all labs ordered are listed, but only abnormal results are displayed) Labs Reviewed  COMPREHENSIVE METABOLIC PANEL - Abnormal; Notable for the following components:      Result Value   Sodium 132 (*)    Chloride 96 (*)    Glucose, Bld 110 (*)    Calcium 8.0 (*)    Total Protein 5.8 (*)    Albumin 2.8 (*)    AST 47 (*)    Alkaline Phosphatase 202 (*)    Total Bilirubin 1.7 (*)    All other components within normal limits  CBC WITH DIFFERENTIAL/PLATELET - Abnormal; Notable for the following components:   RBC 2.48 (*)    Hemoglobin 7.8 (*)    HCT 25.2 (*)    MCV 101.6 (*)    RDW 18.4 (*)    Lymphs Abs 0.2 (*)    Abs Immature Granulocytes 0.15 (*)    All other components within normal limits  URINALYSIS, ROUTINE W REFLEX MICROSCOPIC - Abnormal; Notable for the following  components:   Color, Urine AMBER (*)    Hgb urine dipstick SMALL (*)    Protein, ur 100 (*)    Bacteria, UA RARE (*)    All other components within normal limits  LIPASE, BLOOD -  Abnormal; Notable for the following components:   Lipase 64 (*)    All other components within normal limits  URINE CULTURE  CULTURE, BLOOD (ROUTINE X 2)  CULTURE, BLOOD (ROUTINE X 2)  LACTIC ACID, PLASMA  LACTIC ACID, PLASMA  I-STAT BETA HCG BLOOD, ED ([facility], [facility], [facility] ONLY)    EKG None  Radiology CT ABDOMEN PELVIS W CONTRAST  Result Date: 01/21/2020 CLINICAL DATA:  59 year old female with abdominal and pelvic pain with fever for 2 days. History of metastatic pancreatic carcinoma to the liver. EXAM: CT ABDOMEN AND PELVIS WITH CONTRAST TECHNIQUE: Multidetector CT imaging of the abdomen and pelvis was performed using the standard protocol following bolus administration of intravenous contrast. CONTRAST:  13m OMNIPAQUE IOHEXOL 300 MG/ML  SOLN COMPARISON:  01/09/2020 CT and prior studies FINDINGS: Lower chest: A small RIGHT pleural effusion has decreased in size. Bibasilar atelectasis again noted. Hepatobiliary: Innumerable hepatic masses/metastases are again identified and not significantly changed from 01/09/2020. Mild intrahepatic biliary fullness noted. Cholelithiasis again identified. No CBD dilatation identified. Pancreas: Pancreatic ductal dilatation of pancreatic body and tail again noted. Ill-defined fullness in the pancreatic head/neck has not significantly changed. Spleen: Splenomegaly is again identified and unchanged. Adrenals/Urinary Tract: Kidneys, adrenal glands and bladder are unremarkable. Stomach/Bowel: No evidence of bowel obstruction or definite bowel wall thickening. Vascular/Lymphatic: Mild aortic atherosclerotic calcifications are again noted. Evidence of portal venous hypertension with prominent portal and splenic veins noted. Unchanged 2.3 x 3.1 cm portacaval lymph node. No new lymph nodes are identified. Reproductive: Small uterine fibroid again noted without other significant change Other: A small amount of ascites is present within the abdomen and pelvis. No evidence of  pneumoperitoneum. Musculoskeletal: No acute or suspicious bony abnormalities are identified. Bilateral L5 pars defects are again noted without evidence of spondylolisthesis. IMPRESSION: 1. No significant change of pancreatic fullness/mass, innumerable hepatic masses, abdominal lymphadenopathy and small amount of ascites compatible with metastatic disease. 2. Unchanged splenomegaly. 3. Decreased small RIGHT pleural effusion. Bibasilar atelectasis again noted. 4. Aortic Atherosclerosis (ICD10-I70.0). Electronically Signed   By: JMargarette CanadaM.D.   On: 01/21/2020 17:02   DG Chest Portable 1 View  Result Date: 01/21/2020 CLINICAL DATA:  Fever and pain for 2 days. EXAM: PORTABLE CHEST 1 VIEW COMPARISON:  04/09/2019 chest radiograph and 01/09/2020 chest CT FINDINGS: The cardiomediastinal silhouette is unremarkable. A RIGHT Port-A-Cath is identified with tip overlying the SUPERIOR cavoatrial junction. This is a low volume film with minimal bibasilar atelectasis. There is no evidence of focal airspace disease, pulmonary edema, suspicious pulmonary nodule/mass, pleural effusion, or pneumothorax. No acute bony abnormalities are identified. IMPRESSION: Low volume film with minimal bibasilar atelectasis. Electronically Signed   By: JMargarette CanadaM.D.   On: 01/21/2020 18:36    Procedures Procedures (including critical care time)  Medications Ordered in ED Medications  sodium chloride flush (NS) 0.9 % injection 3 mL (has no administration in time range)  iohexol (OMNIPAQUE) 9 MG/ML oral solution (has no administration in time range)  iohexol (OMNIPAQUE) 9 MG/ML oral solution 500 mL (500 mLs Oral Contrast Given 01/21/20 1458)  sodium chloride (PF) 0.9 % injection (has no administration in time range)  lactated ringers bolus 1,000 mL (0 mLs Intravenous Stopped 01/21/20 1859)  iohexol (OMNIPAQUE) 300 MG/ML  solution 100 mL (80 mLs Intravenous Contrast Given 01/21/20 1639)  ketorolac (TORADOL) 30 MG/ML injection 30 mg (30  mg Intravenous Given 01/21/20 1858)  heparin lock flush 100 unit/mL (500 Units Intracatheter Given 01/21/20 2052)    ED Course  I have reviewed the triage vital signs and the nursing notes.  Pertinent labs & imaging results that were available during my care of the patient were reviewed by me and considered in my medical decision making (see chart for details).    MDM Rules/Calculators/A&P                      Patient presenting for evaluation of fever for 3 days, abdominal pain since yesterday which has been progressively improving.  She presents to the ED temp up to 100.2 F, tachycardic on initial presentation with improvement after IV fluids.  She is currently undergoing chemotherapy for her pancreatic cancer.  She states it is not unusual for her to develop a fever after chemotherapy but it typically occurs a couple of days after chemotherapy and will last for 1 day but her symptoms began same day as her chemotherapy treatment and have been persistent for 3 days.  She did have some midline to left-sided abdominal pains beginning yesterday in the process improved.  She has no rebound or guarding noted on examination of the abdomen.  Lab work reviewed and interpreted by myself shows no leukocytosis.  She is anemic but denies rectal bleeding and BUN today is within normal limits which suggests she does not have a GI bleed.  Remainder of lab work appears mostly at patient's baseline or improved from baseline.  Total bilirubin is mildly elevated but work-up does not show any acute obstructive process involving the liver or gallbladder.  UA does not show clear evidence of infection but she does have persistent proteinuria.  We will culture to rule out UTI.    Imaging of the abdomen and pelvis today shows no evidence of acute surgical abdominal pathology.  Chest x-ray shows no evidence of pneumonia, pleural effusion, pneumothorax.  Her lactic acid level is within normal limits and in the absence of  clear infectious source or leukocytosis she does not meet sepsis criteria at this time.  With no clear source of fever, we will obtain blood cultures but she does not need to remain in the hospital for these to result.  Patient was given IV fluids and Toradol in the ED and on reevaluation is resting comfortably in no apparent distress.  Her blood pressure has dropped somewhat since her initial presentation but she states that this is closer to her baseline and chart review confirms that.  MAPs are above 65.  She denies weakness or lightheadedness at this time.  She feels comfortable with discharge.  We will hold off on prophylactic antibiotics in the absence of neutropenia or leukocytosis or urinary symptoms.  She will follow up with her oncologist on Tuesday as recommended by the nurse practitioner she was conversing with via [place].  Discussed strict ED return precautions.  Patient and husband verbalized understanding of and agreement with plan and patient is stable for discharge at this time.  Discussed with Dr. Kathrynn Humble who agrees with assessment and plan at this time.      Final Clinical Impression(s) / ED Diagnoses Final diagnoses:  Generalized abdominal pain  Fever in adult    Rx / DC Orders ED Discharge Orders    None       Nils Flack,  Sylvan Cheese 01/21/20 2116    Varney Biles, MD 01/22/20 [phone]

## 2020-01-21 NOTE — ED Notes (Signed)
Pt care delayed d/t pt wanting port access numbed with ice prior to access.

## 2020-01-21 NOTE — ED Triage Notes (Signed)
Patient here from home with complaints of ongoing fever for 2 days. Also reports mid upper abd pain radiating to left . Hx of pancreatic cancer with mets to liver.

## 2020-01-21 NOTE — Discharge Instructions (Signed)
Your work-up today was reassuring with no obvious signs of infection.  We did obtain blood cultures to rule out sepsis.  These will result within 24 to 48 hours and you will receive a phone call if these results are abnormal but you can also check your results on MyChart.  In the meantime continue to take your medications at home as prescribed.  You can take 500 to 1000 mg of Tylenol every 6 hours as needed for fever, 400 to 600 mg of ibuprofen every 6 hours as needed for fever pain as well.  Do not exceed more than 4000 mg of Tylenol daily and take ibuprofen with food to avoid upset stomach issues.  Please follow-up with your oncologist on Tuesday as planned.  Call them first thing Tuesday morning to schedule a follow-up appointment.  You can also continue to send the messages on MyChart as you have been to keep them updated.  Return to the emergency department if any concerning signs or symptoms develop such as persistent vomiting, severe pain, chest pain, shortness of breath, weakness or loss of consciousness.

## 2020-01-22 ENCOUNTER — Encounter: Payer: Self-pay | Admitting: Nurse Practitioner

## 2020-01-22 LAB — URINE CULTURE: Culture: NO GROWTH

## 2020-01-23 ENCOUNTER — Telehealth: Payer: Self-pay

## 2020-01-23 NOTE — Telephone Encounter (Signed)
TC to pt per Cira Rue NP to follow up with her since she was in the ED this weekend for fever >101. I let her know that Lacie had reviewed the work up which is unrevealing and sent her a mychart message to resume Xeloda if her temps have been stable and she is feeeling well. Patient stated that she is feeling a little better and she hasn't had a fever this morning or yesterday. The highest that her temp got was to 99.4. She did resume Xeloda last night and her belly is still a little tender but better.offered her an appoitnemtn to see me this afternoon 1:45, or she can wait for her scheduled f/u later this week. Patient stated since she is feeling better she will wait to come in until her appointment this Friday. Lacie made aware

## 2020-01-26 ENCOUNTER — Other Ambulatory Visit: Payer: BLUE CROSS/BLUE SHIELD

## 2020-01-26 ENCOUNTER — Inpatient Hospital Stay: Payer: BLUE CROSS/BLUE SHIELD | Attending: Hematology

## 2020-01-26 ENCOUNTER — Inpatient Hospital Stay: Payer: BLUE CROSS/BLUE SHIELD

## 2020-01-26 ENCOUNTER — Other Ambulatory Visit: Payer: Self-pay

## 2020-01-26 DIAGNOSIS — C259 Malignant neoplasm of pancreas, unspecified: Secondary | ICD-10-CM | POA: Insufficient documentation

## 2020-01-26 DIAGNOSIS — C251 Malignant neoplasm of body of pancreas: Secondary | ICD-10-CM

## 2020-01-26 DIAGNOSIS — C787 Secondary malignant neoplasm of liver and intrahepatic bile duct: Secondary | ICD-10-CM | POA: Diagnosis present

## 2020-01-26 DIAGNOSIS — Z7189 Other specified counseling: Secondary | ICD-10-CM

## 2020-01-26 DIAGNOSIS — Z5111 Encounter for antineoplastic chemotherapy: Secondary | ICD-10-CM | POA: Insufficient documentation

## 2020-01-26 LAB — COMPREHENSIVE METABOLIC PANEL
ALT: 25 U/L (ref 0–44)
AST: 54 U/L — ABNORMAL HIGH (ref 15–41)
Albumin: 3 g/dL — ABNORMAL LOW (ref 3.5–5.0)
Alkaline Phosphatase: 312 U/L — ABNORMAL HIGH (ref 38–126)
Anion gap: 14 (ref 5–15)
BUN: 17 mg/dL (ref 6–20)
CO2: 23 mmol/L (ref 22–32)
Calcium: 10.2 mg/dL (ref 8.9–10.3)
Chloride: 104 mmol/L (ref 98–111)
Creatinine, Ser: 1.08 mg/dL — ABNORMAL HIGH (ref 0.44–1.00)
GFR calc Af Amer: >60 mL/min (ref >60)
GFR calc non Af Amer: 57 mL/min — ABNORMAL LOW (ref >60)
Glucose, Bld: 161 mg/dL — ABNORMAL HIGH (ref 70–99)
Potassium: 3.5 mmol/L (ref 3.5–5.1)
Sodium: 141 mmol/L (ref 135–145)
Total Bilirubin: 0.9 mg/dL (ref 0.3–1.2)
Total Protein: 7.3 g/dL (ref 6.5–8.1)

## 2020-01-26 LAB — CBC WITH DIFFERENTIAL/PLATELET
Abs Immature Granulocytes: 0.05 10*3/uL (ref 0.00–0.07)
Basophils Absolute: 0 10*3/uL (ref 0.0–0.1)
Basophils Relative: 1 %
Eosinophils Absolute: 0 10*3/uL (ref 0.0–0.5)
Eosinophils Relative: 0 %
HCT: 28.3 % — ABNORMAL LOW (ref 36.0–46.0)
Hemoglobin: 9 g/dL — ABNORMAL LOW (ref 12.0–15.0)
Immature Granulocytes: 1 %
Lymphocytes Relative: 10 %
Lymphs Abs: 0.4 10*3/uL — ABNORMAL LOW (ref 0.7–4.0)
MCH: 31.5 pg (ref 26.0–34.0)
MCHC: 31.8 g/dL (ref 30.0–36.0)
MCV: 99 fL (ref 80.0–100.0)
Monocytes Absolute: 0.4 10*3/uL (ref 0.1–1.0)
Monocytes Relative: 11 %
Neutro Abs: 2.9 10*3/uL (ref 1.7–7.7)
Neutrophils Relative %: 77 %
Platelets: 140 10*3/uL — ABNORMAL LOW (ref 150–400)
RBC: 2.86 MIL/uL — ABNORMAL LOW (ref 3.87–5.11)
RDW: 17.9 % — ABNORMAL HIGH (ref 11.5–15.5)
WBC: 3.8 10*3/uL — ABNORMAL LOW (ref 4.0–10.5)
nRBC: 0 % (ref 0.0–0.2)

## 2020-01-26 MED ORDER — HEPARIN SOD (PORK) LOCK FLUSH 100 UNIT/ML IV SOLN
500.0000 [IU] | Freq: Once | INTRAVENOUS | Status: AC | PRN
  Administered 2020-01-26: 500 [IU]
  Filled 2020-01-26: qty 5

## 2020-01-26 MED ORDER — SODIUM CHLORIDE 0.9 % IV SOLN
Freq: Once | INTRAVENOUS | Status: AC
  Filled 2020-01-26: qty 250

## 2020-01-26 MED ORDER — SODIUM CHLORIDE 0.9 % IV SOLN
600.0000 mg/m2 | Freq: Once | INTRAVENOUS | Status: AC
  Administered 2020-01-26: 912 mg via INTRAVENOUS
  Filled 2020-01-26: qty 23.99

## 2020-01-26 MED ORDER — SODIUM CHLORIDE 0.9 % IV SOLN: 30.0000 mg/m2 | Freq: Once | INTRAVENOUS | Status: DC

## 2020-01-26 MED ORDER — SODIUM CHLORIDE 0.9 % IV SOLN
30.0000 mg/m2 | Freq: Once | INTRAVENOUS | Status: AC
  Administered 2020-01-26: 40 mg via INTRAVENOUS
  Filled 2020-01-26: qty 4

## 2020-01-26 MED ORDER — SODIUM CHLORIDE 0.9% FLUSH
10.0000 mL | INTRAVENOUS | Status: DC | PRN
  Administered 2020-01-26: 10 mL
  Filled 2020-01-26: qty 10

## 2020-01-26 MED ORDER — SODIUM CHLORIDE 0.9 % IV SOLN
30.0000 mg/m2 | Freq: Once | INTRAVENOUS | Status: DC
  Filled 2020-01-26: qty 4

## 2020-01-26 MED ORDER — SODIUM CHLORIDE 0.9 % IV SOLN
10.0000 mg | Freq: Once | INTRAVENOUS | Status: AC
  Administered 2020-01-26: 10 mg via INTRAVENOUS
  Filled 2020-01-26: qty 10

## 2020-01-27 LAB — CULTURE, BLOOD (ROUTINE X 2)
Culture: NO GROWTH
Culture: NO GROWTH
Special Requests: ADEQUATE
Special Requests: ADEQUATE

## 2020-01-29 LAB — CANCER ANTIGEN 19-9: CA 19-9: 144 U/mL — ABNORMAL HIGH (ref 0–35)

## 2020-02-02 NOTE — Progress Notes (Signed)
Pharmacist Chemotherapy Monitoring - Follow Up Assessment    I verify that I have reviewed each item in the below checklist:   Regimen for the patient is scheduled for the appropriate day and plan matches scheduled date.  Appropriate non-routine labs are ordered dependent on drug ordered.  If applicable, additional medications reviewed and ordered per protocol based on lifetime cumulative doses and/or treatment regimen.   Plan for follow-up and/or issues identified: No  I-vent associated with next due treatment: Yes  MD and/or nursing notified: No   Kennith Center, Pharm.D., CPP 02/02/2020@4 :32 PM

## 2020-02-08 ENCOUNTER — Inpatient Hospital Stay (HOSPITAL_BASED_OUTPATIENT_CLINIC_OR_DEPARTMENT_OTHER): Payer: BLUE CROSS/BLUE SHIELD | Admitting: Nurse Practitioner

## 2020-02-08 ENCOUNTER — Encounter: Payer: Self-pay | Admitting: Nurse Practitioner

## 2020-02-08 ENCOUNTER — Inpatient Hospital Stay: Payer: BLUE CROSS/BLUE SHIELD

## 2020-02-08 ENCOUNTER — Other Ambulatory Visit: Payer: Self-pay

## 2020-02-08 VITALS — BP 111/72 | HR 89 | Temp 97.7°F | Resp 16 | Ht 65.0 in | Wt 100.9 lb

## 2020-02-08 DIAGNOSIS — C251 Malignant neoplasm of body of pancreas: Secondary | ICD-10-CM

## 2020-02-08 DIAGNOSIS — C259 Malignant neoplasm of pancreas, unspecified: Secondary | ICD-10-CM | POA: Diagnosis not present

## 2020-02-08 DIAGNOSIS — C787 Secondary malignant neoplasm of liver and intrahepatic bile duct: Secondary | ICD-10-CM | POA: Diagnosis not present

## 2020-02-08 DIAGNOSIS — Z7189 Other specified counseling: Secondary | ICD-10-CM

## 2020-02-08 LAB — COMPREHENSIVE METABOLIC PANEL
ALT: 25 U/L (ref 0–44)
AST: 50 U/L — ABNORMAL HIGH (ref 15–41)
Albumin: 3.4 g/dL — ABNORMAL LOW (ref 3.5–5.0)
Alkaline Phosphatase: 298 U/L — ABNORMAL HIGH (ref 38–126)
Anion gap: 8 (ref 5–15)
BUN: 13 mg/dL (ref 6–20)
CO2: 26 mmol/L (ref 22–32)
Calcium: 9.3 mg/dL (ref 8.9–10.3)
Chloride: 105 mmol/L (ref 98–111)
Creatinine, Ser: 0.96 mg/dL (ref 0.44–1.00)
GFR calc Af Amer: >60 mL/min (ref >60)
GFR calc non Af Amer: >60 mL/min (ref >60)
Glucose, Bld: 111 mg/dL — ABNORMAL HIGH (ref 70–99)
Potassium: 3.6 mmol/L (ref 3.5–5.1)
Sodium: 139 mmol/L (ref 135–145)
Total Bilirubin: 1.1 mg/dL (ref 0.3–1.2)
Total Protein: 7 g/dL (ref 6.5–8.1)

## 2020-02-08 LAB — CBC WITH DIFFERENTIAL/PLATELET
Abs Immature Granulocytes: 0.03 10*3/uL (ref 0.00–0.07)
Basophils Absolute: 0 10*3/uL (ref 0.0–0.1)
Basophils Relative: 1 %
Eosinophils Absolute: 0 10*3/uL (ref 0.0–0.5)
Eosinophils Relative: 1 %
HCT: 29.1 % — ABNORMAL LOW (ref 36.0–46.0)
Hemoglobin: 9.3 g/dL — ABNORMAL LOW (ref 12.0–15.0)
Immature Granulocytes: 1 %
Lymphocytes Relative: 6 %
Lymphs Abs: 0.3 10*3/uL — ABNORMAL LOW (ref 0.7–4.0)
MCH: 32.1 pg (ref 26.0–34.0)
MCHC: 32 g/dL (ref 30.0–36.0)
MCV: 100.3 fL — ABNORMAL HIGH (ref 80.0–100.0)
Monocytes Absolute: 0.6 10*3/uL (ref 0.1–1.0)
Monocytes Relative: 12 %
Neutro Abs: 4 10*3/uL (ref 1.7–7.7)
Neutrophils Relative %: 79 %
Platelets: 386 10*3/uL (ref 150–400)
RBC: 2.9 MIL/uL — ABNORMAL LOW (ref 3.87–5.11)
RDW: 19.9 % — ABNORMAL HIGH (ref 11.5–15.5)
WBC: 5 10*3/uL (ref 4.0–10.5)
nRBC: 0 % (ref 0.0–0.2)

## 2020-02-08 MED ORDER — SODIUM CHLORIDE 0.9 % IV SOLN
600.0000 mg/m2 | Freq: Once | INTRAVENOUS | Status: AC
  Administered 2020-02-08: 912 mg via INTRAVENOUS
  Filled 2020-02-08: qty 23.99

## 2020-02-08 MED ORDER — SODIUM CHLORIDE 0.9 % IV SOLN
30.0000 mg/m2 | Freq: Once | INTRAVENOUS | Status: AC
  Administered 2020-02-08: 40 mg via INTRAVENOUS
  Filled 2020-02-08: qty 4

## 2020-02-08 MED ORDER — SODIUM CHLORIDE 0.9% FLUSH
10.0000 mL | INTRAVENOUS | Status: DC | PRN
  Administered 2020-02-08: 10 mL
  Filled 2020-02-08: qty 10

## 2020-02-08 MED ORDER — SODIUM CHLORIDE 0.9 % IV SOLN
Freq: Once | INTRAVENOUS | Status: AC
  Filled 2020-02-08: qty 250

## 2020-02-08 MED ORDER — HEPARIN SOD (PORK) LOCK FLUSH 100 UNIT/ML IV SOLN
500.0000 [IU] | Freq: Once | INTRAVENOUS | Status: AC | PRN
  Administered 2020-02-08: 500 [IU]
  Filled 2020-02-08: qty 5

## 2020-02-08 MED ORDER — SODIUM CHLORIDE 0.9 % IV SOLN
10.0000 mg | Freq: Once | INTRAVENOUS | Status: AC
  Administered 2020-02-08: 10 mg via INTRAVENOUS
  Filled 2020-02-08: qty 10

## 2020-02-08 NOTE — Patient Instructions (Signed)
[facility] Discharge Instructions for Patients Receiving Chemotherapy  Today you received the following chemotherapy agents: Gemcitabine (Gemzar) and Docetaxel (Taxotere)  To help prevent nausea and vomiting after your treatment, we encourage you to take your nausea medication as directed by your provider.   If you develop nausea and vomiting that is not controlled by your nausea medication, call the clinic.   BELOW ARE SYMPTOMS THAT SHOULD BE REPORTED IMMEDIATELY:  *FEVER GREATER THAN 100.5 F  *CHILLS WITH OR WITHOUT FEVER  NAUSEA AND VOMITING THAT IS NOT CONTROLLED WITH YOUR NAUSEA MEDICATION  *UNUSUAL SHORTNESS OF BREATH  *UNUSUAL BRUISING OR BLEEDING  TENDERNESS IN MOUTH AND THROAT WITH OR WITHOUT PRESENCE OF ULCERS  *URINARY PROBLEMS  *BOWEL PROBLEMS  UNUSUAL RASH Items with * indicate a potential emergency and should be followed up as soon as possible.  Feel free to call the clinic should you have any questions or concerns. The clinic phone number is [phone].  Please show the [facility] at check-in to the Emergency Department and triage nurse.

## 2020-02-08 NOTE — Progress Notes (Signed)
[place]   [phone] [phone]   Clinic Follow up Note   Patient Care Team: [name], MD as PCP - General (Family Medicine) [name], MD as Consulting Physician (Otolaryngology) [name], MD as Consulting Physician (Oncology) 02/08/2020  CHIEF COMPLAINT: F/u pancreas cancer   SUMMARY OF ONCOLOGIC HISTORY: Oncology History Overview Note  Cancer Staging Pancreatic cancer [facility]) Staging form: Exocrine Pancreas, AJCC 8th Edition - Clinical stage from 08/06/2017: Stage IV (cTX, cN0, pM1) - Signed by [name], MD on 08/12/2017     Pancreatic cancer ([place])  07/23/2017 Imaging   US abdomen limited RUQ 07/23/17 IMPRESSION: 1. Cholelithiasis.  No secondary signs of acute cholecystitis. 2. Multiple solid liver masses measuring up to 6.5 cm in the left lobe of the liver, evaluation for metastatic disease is recommended. These results will be called to the ordering clinician or representative by the Radiologist Assistant, and communication documented in the PACS or zVision Dashboard.   07/23/2017 Imaging   CT Abdomen W Contrast 07/23/17 IMPRESSION: 1. Widespread metastatic disease throughout the liver. No clear primary malignancy identified in the abdomen. The pelvis was not imaged. Tissue sampling recommended. 2. Probable adenopathy superior to the pancreatic tail. No evidence of pancreatic mass. 3. Suspected incidental hemangioma inferiorly in the right hepatic lobe. 4. Nonspecific nodularity in the breasts. The patient has undergone recent (03/25/2017 and 04/01/2017) mammography and ultrasound.   07/29/2017 Initial Diagnosis   Metastasis to liver of unknown origin ([place])   07/29/2017 PET scan   PET 07/29/17  IMPRESSION: 1. Numerous bulky liver masses are hypermetabolic compatible with malignancy. 2. Accentuated activity within or along the pancreatic tail, likely represent a primary pancreatic tumor. Consider pancreatic  protocol MRI to further work this up. 3. The peripancreatic lymph node shown above the pancreatic tail is mildly hypermetabolic favoring malignancy. 4.  Prominent stool throughout the colon favors constipation. 5. 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   08/05/2017 Pathology Results   Liver Biopsy  Diagnosis 08/05/17 Liver, needle/core biopsy - CARCINOMA. - SEE COMMENT. Microscopic Comment The malignant cells are positive for cytokeratin 7. They are negative for arginase, CDX2, cytokeratin 20, estrogen receptor, GATA-3, GCDFP, Glypican 3, Hep Par 1, Napsin A, and TTF-1. This immunohistochemical is nonspecific. Possibly primary sources include pancreatobiliary and upper gastrointestinal. Radiologic correlation is necessary. Of note, organ specific markers (GATA-3, GCDFP-breast, TTF-1, Napsin A-lung, and CDX2-colon) are negative. (JBK:ecj 08/09/2017)   08/21/2017 - 02/10/2018 Chemotherapy   FOLFIRINOX every 2 weeks starting 08/21/17. Dose reduced due to neuropahty and cytopenia    10/14/2017 Imaging   CT CAP WO Contrast 10/14/17 IMPRESSION: Evidence of known numerous liver metastases without significant interval change. No other evidence of metastatic disease within the chest, abdomen or pelvis. Cholelithiasis. Tiny pericardial effusion.   12/02/2017 Genetic Testing   Negative for pathogenic mutation.  The genes analyzed were the 83 genes on [facility]'s Multi-Cancer panel (ALK, APC, ATM, AXIN2, BAP1, BARD1, BLM, BMPR1A, BRCA1, BRCA2, BRIP1, CASR, CDC73, CDH1, CDK4, CDKN1B, CDKN1C, CDKN2A, CEBPA, CHEK2, CTNNA1, DICER1, DIS3L2, EGFR, EPCAM, FH, FLCN, GATA2, GPC3, GREM1, HOXB13, HRAS, KIT, MAX, MEN1, MET, MITF, MLH1, MSH2, MSH3, MSH6, MUTYH, NBN, NF1, NF2, NTHL1, PALB2, PDGFRA, PHOX2B, PMS2, POLD1, POLE, POT1, PRKAR1A, PTCH1, PTEN, RAD50, RAD51C, RAD51D, RB1, RECQL4, RET, RUNX1, SDHA, SDHAF2, SDHB, SDHC, SDHD, SMAD4, SMARCA4, SMARCB1, SMARCE1, STK11, SUFU, TERC, TERT, TMEM127, TP53, TSC1, TSC2,  VHL, WRN, WT1).   12/17/2017 PET scan   IMPRESSION: 1. Although the liver metastases are still visible on the  CT images, they are significantly smaller and retain no abnormal metabolic activity, consistent with response to therapy. 2. No abnormal activity within the pancreas. 3. No disease progression identified. 4. Cholelithiasis.   02/21/2018 PET scan   02/21/2018 PET Scan  IMPRESSION: 1. There are 2 new small nodules identified within the right lung which measure up to 5 mm. These are too small to reliably characterize by PET-CT, but warrant close interval follow-up. 2. Again noted are multifocal liver metastasis. The target lesion within the left lobe is slightly decreased in size when compared with the previous exam. Similar to previous exam there is no abnormal hypermetabolism above background liver activity identified within the liver lesions. 3. Gallstones.   02/23/2018 - 09/04/2018 Chemotherapy   5-FU pump infusion and liposomal irinotecan, every 2 weeks.  First cycle dose reduced due to cytopenia in the travel. Stopped on 08/25/2018 due to disease progression.    05/17/2018 Imaging   05/17/2018 PET Scan IMPRESSION: 1. Mixed response. The 2 small right lung nodules have decreased in size in the interval. Single focus of increased uptake within the liver is new from 02/21/2018 and concerning for recurrent metabolically active liver metastasis. 2. Splenomegaly.  New from previous exam. 3. Diffusely increased bone marrow activity, likely reflecting treatment related changes.   09/01/2018 PET scan   PET 09/01/18 IMPRESSION: 1. Unfortunately there recurrence of hepatic metastasis with multiple new hypermetabolic lesions in LEFT and RIGHT hepatic lobe. 2. New extra hepatic site of malignancy which appears associated the mid pancreas. Difficult to define lesion on noncontrast exam. 3. Diffuse marrow activity is favored benign. 4. No evidence of pulmonary metastasis.   09/12/2018 -  03/27/2019 Chemotherapy   second-line Gemcitabine and Abraxane for 2 weeks on, 1 week off starting 09/12/2018. Due to neuropathy, I will start her with dose reduced Abraxane. Stopped after 03/27/19 due to disease progression.    12/14/2018 Imaging   CT CAP  IMPRESSION: CT demonstrates acute cholecystitis, with choledocholithiasis of the cystic duct.  These results were discussed by telephone at the time of interpretation on 12/14/2018 at 5:05 pm with Dr. Burr Medico.  Redemonstration of known liver metastases, with positive response to therapy, interval reduction in size of all lesions, with multiple demonstrating significant internal necrosis.  No acute finding of the chest.  Ancillary findings as above.   03/21/2019 Imaging   CT CAP W Contrast IMPRESSION: 1. Numerous rim enhancing liver metastases show overall trend towards mild progression although 1 of the index lesions is stable in the interval. 2. Interval development of portocaval lymphadenopathy concerning for disease progression. 3. Slight progression of main duct dilatation in the pancreas with abrupt cut off in the region of the body. 4. Interval development of a 9 mm subpleural nodule at the right base along the posterior hemidiaphragm. Close attention on follow-up recommended as metastatic disease not excluded. 5. New splenomegaly. 6. Cholelithiasis with diffuse gallbladder wall thickening. Gallbladder wall thickening is decreased in the interval. Stones and sludge noted in the gallbladder lumen.   04/09/2019 Imaging   CT AP W Contrast 04/09/19  IMPRESSION: 1. Multiple gallstones in lumen gallbladder without evidence acute cholecystitis. No significant change from prior. No biliary duct dilatation. Common bile duct normal. 2. Widespread hepatic metastasis unchanged from comparison CT. 3. Chronic pancreatic duct dilatation in the body and tail unchanged. 4. No bowel obstruction. 5. Small amount of intraperitoneal free  fluid unchanged.   04/10/2019 -  Chemotherapy   Third-line GTX with Xeloda 1038m BID day 1-14, off  for 1 week with Docetaxel and Gemcitbine on day 4 and day 11 every 3 weeks starting oral chemo on 04/10/19.    07/03/2019 Imaging   CT CAP W Contrast  IMPRESSION: 1. Overall stable diffuse hepatic metastatic disease. No new/progressive findings. 2. Stable to slightly larger periportal lymph node. 3. Stable CT appearance of the pancreas. Abrupt cut off of a dilated main pancreatic duct in the body/head junction region without obvious pancreatic lesion. 4. No pulmonary nodules are identified at the lung bases. 5. Stable mild splenomegaly. 6. No new/acute findings.     10/17/2019 Imaging   CT CAP W Contrast  IMPRESSION: 1. Overall mild progression of metastatic disease. Portacaval lymphadenopathy has increased. Enlarging lesion along the posterior right diaphragm is suspicious for growing right pleural metastasis. 2. Bulky liver metastatic disease is relatively stable with mild mixed changes as detailed. 3. Pancreatic body tumor is stable. 4. Moderate splenomegaly, increased. 5. Aortic Atherosclerosis (ICD10-I70.0). Additional chronic findings as detailed.       01/09/2020 Imaging   CT CHEST/ABD/PEL IMPRESSION 1. No specific evidence of metastatic disease within the chest. 2. New small right pleural effusion. 1. Relatively similar size of pancreatic primary. 2. Although direct comparison of liver metastasis is challenging secondary to differences in bolus timing, mild progression is identified. 3. Mild progression of abdominal adenopathy. 4. Persistent hepatosplenomegaly. 5. Trace perihepatic ascites, similar. 6. Borderline small bowel dilatation, without focal transition point. Ileus versus low-grade partial small bowel obstruction. 7. Cholelithiasis.   Pancreatic cancer metastasized to liver ([place])  08/11/2017 Initial Diagnosis   Pancreatic cancer metastasized to liver  [facility])     CURRENT THERAPY:  Third-line GTX with Xeloda1086mBID day 1-14, off for 1 week with Docetaxel37mm2and Gemcitabine75077m2on day 4 and day 11 every 3 weeks starting oral chemo on 04/10/19.Starting 07/06/19 she will reduce Xeloda to 1000m40m the AM and 500mg73mthe PM due to low blood counts.  INTERVAL HISTORY: Ms. HanneCornwallrns for f/u and treatment as scheduled. After cycle 14 day 4 she developed persistent fevers more than 3 days, was told to hold Xeloda and sent to ED. Blood and urine cultures negative. CT AP was stable, CXR negative. No infectious source was identified.  Fevers resolved and she resumed Xeloda. She completed cycle 14 day 8 on 01/26/20. She is doing OK today. Abdomen feels bloated and hard. She alternates between constipation and diarrhea. She has a low hernia she has to brace. She also has painful hemorrhoids, not improved with preparation H. This is painful when walking. Denies bleeding. Tmax after last chemo 100.9, no chills, dysuria, cough, chest pain, dyspnea. She has oral sensitivity but no sores. Neuropathy in her feet might be little worse, no issues with ambulation. Takes gabapentin BID.     MEDICAL HISTORY:  Past Medical History:  Diagnosis Date  . Diarrhea of presumed infectious origin 01/12/2018  . Gastroenteritis 01/12/2018  . Genetic testing 12/02/2017   Multi-Cancer panel (83 genes) @ [facility] - No pathogenic mutations detected  . Meniere disease 2016  . met pancreatic ca to liver dx'd 07/2017  . Pancreatitis     SURGICAL HISTORY: Past Surgical History:  Procedure Laterality Date  . CERVICAL ABLATION    . ENDOMETRIAL ABLATION  2011  . IR CHOLANGIOGRAM EXISTING TUBE  01/09/2019  . IR FLUORO GUIDE PORT INSERTION RIGHT  08/12/2017  . IR PERC CHOLECYSTOSTOMY  12/15/2018  . IR US [place] VASC ACCESS RIGHT  08/12/2017  . MANDINedrow have  reviewed the social history and family history with the patient and they are unchanged from  previous note.  ALLERGIES:  has No Known Allergies.  MEDICATIONS:  Current Outpatient Medications  Medication Sig Dispense Refill  . b complex vitamins tablet Take 1 tablet by mouth daily.    . capecitabine (XELODA) 500 MG tablet TAKE 2 TABLETS BY MOUTH IN THE MORNING AND 1 TABLET IN THE EVENING, EVERY 12 HOURS, WITHIN 30 MINUTES OF MEALS ON DAYS 1 TO 14 OF EACH 21 DAY CYCLE (Patient taking differently: 500-1,000 mg See admin instructions. Takes 2 tablets by mouth in the morning and 1 tablet in the ebvening, every 12 hours, within 30 minutes of meals on days 1 to 14 of each 21 day cycle.) 42 tablet 3  . Cholecalciferol (VITAMIN D3 GUMMIES ADULT) 25 MCG (1000 UT) CHEW Chew 2,000 Units by mouth daily.     . cyclobenzaprine (FLEXERIL) 5 MG tablet Take 1 tablet (5 mg total) by mouth 3 (three) times daily as needed for muscle spasms. 30 tablet 0  . gabapentin (NEURONTIN) 100 MG capsule Take 1 capsule (100 mg total) by mouth 3 (three) times daily. (Patient taking differently: Take 100 mg by mouth in the morning and at bedtime. ) 270 capsule 2  . lidocaine-prilocaine (EMLA) cream Apply 1 application topically as needed. (Patient taking differently: Apply 1 application topically as needed (for pain). ) 30 g 2  . loperamide (IMODIUM) 2 MG capsule Take 1 capsule (2 mg total) by mouth as needed for diarrhea or loose stools. 30 capsule 0  . loratadine (CLARITIN) 10 MG tablet Take 10 mg by mouth daily as needed for allergies.    Marland Kitchen meclizine (ANTIVERT) 25 MG tablet Take 25 mg by mouth 3 (three) times daily as needed for dizziness.    . ondansetron (ZOFRAN-ODT) 8 MG disintegrating tablet TAKE 1 TABLET BY MOUTH EVERY 8 HOURS AS NEEDED FOR NAUSE OR VOMITING (Patient taking differently: Take 8 mg by mouth every 8 (eight) hours as needed for nausea or vomiting. ) 40 tablet 1  . potassium chloride (KLOR-CON) 10 MEQ tablet Take 1 tablet (10 mEq total) by mouth 4 (four) times daily. 360 tablet 2  . PROMACTA 50 MG tablet  TAKE 1 TABLET BY MOUTH 1 TIME A DAY. (Patient taking differently: Take 50 mg by mouth daily. ) 30 tablet 1  . Rivaroxaban (XARELTO) 15 MG TABS tablet TAKE 1 TABLET(15 MG) BY MOUTH DAILY (Patient taking differently: Take 15 mg by mouth daily. ) 90 tablet 1  . traMADol (ULTRAM) 50 MG tablet Take 1 tablet (50 mg total) by mouth every 6 (six) hours as needed for moderate pain or severe pain. 30 tablet 0  . magic mouthwash w/lidocaine SOLN Take 10 mLs by mouth 3 (three) times daily as needed for mouth pain. Swish and spit 10 ML by mouth 3 times daily as needed for mouth pain. (Patient not taking: Reported on 01/21/2020) 240 mL 0  . prochlorperazine (COMPAZINE) 10 MG tablet Take 1 tablet (10 mg total) by mouth every 6 (six) hours as needed for nausea or vomiting. (Patient not taking: Reported on 01/21/2020) 40 tablet 2   No current facility-administered medications for this visit.   Facility-Administered Medications Ordered in Other Visits  Medication Dose Route Frequency Provider Last Rate Last Admin  . heparin lock flush 100 unit/mL  500 Units Intracatheter Once PRN [name], MD      . sodium chloride flush (NS) 0.9 % injection 10 mL  10 mL Intracatheter Once [name], MD      . sodium chloride flush (NS) 0.9 % injection 10 mL  10 mL Intracatheter PRN [name], MD        PHYSICAL EXAMINATION: ECOG PERFORMANCE STATUS: 1 - Symptomatic but completely ambulatory  Vitals:   02/08/20 1142  BP: 111/72  Pulse: 89  Resp: 16  Temp: 97.7 F (36.5 C)  SpO2: 100%   Filed Weights   02/08/20 1142  Weight: 100 lb 14.4 oz (45.8 kg)    GENERAL:alert, no distress and comfortable SKIN: scattered ecchymoses to arms. No rash. Palms without erythema   EYES: sclera clear LUNGS: clear with normal breathing effort HEART: regular rate & rhythm, no lower extremity edema ABDOMEN: abdomen soft, non-tender and normal bowel sounds. No obvious inguinal hernia when supine. Splenomegaly EXTERNAL exam shows prolapsed  hemorrhoids NEURO: alert & oriented x 3 with fluent speech, normal gait PAC without erythema   LABORATORY DATA:  I have reviewed the data as listed CBC Latest Ref Rng & Units 02/08/2020 01/26/2020 01/21/2020  WBC 4.0 - 10.5 K/uL 5.0 3.8(L) 8.1  Hemoglobin 12.0 - 15.0 g/dL 9.3(L) 9.0(L) 7.8(L)  Hematocrit 36 - 46 % 29.1(L) 28.3(L) 25.2(L)  Platelets 150 - 400 K/uL 386 140(L) 193     CMP Latest Ref Rng & Units 02/08/2020 01/26/2020 01/21/2020  Glucose 70 - 99 mg/dL 111(H) 161(H) 110(H)  BUN 6 - 20 mg/dL 13 17 19   Creatinine 0.44 - 1.00 mg/dL 0.96 1.08(H) 0.91  Sodium 135 - 145 mmol/L 139 141 132(L)  Potassium 3.5 - 5.1 mmol/L 3.6 3.5 3.9  Chloride 98 - 111 mmol/L 105 104 96(L)  CO2 22 - 32 mmol/L 26 23 26   Calcium 8.9 - 10.3 mg/dL 9.3 10.2 8.0(L)  Total Protein 6.5 - 8.1 g/dL 7.0 7.3 5.8(L)  Total Bilirubin 0.3 - 1.2 mg/dL 1.1 0.9 1.7(H)  Alkaline Phos 38 - 126 U/L 298(H) 312(H) 202(H)  AST 15 - 41 U/L 50(H) 54(H) 47(H)  ALT 0 - 44 U/L 25 25 23       RADIOGRAPHIC STUDIES: I have personally reviewed the radiological images as listed and agreed with the findings in the report. No results found.   ASSESSMENT & PLAN: Morgan Dawson a 58 y.o.femalewith   1. 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 She was treated withfirst linechemoFOLFIRINOX with dose reduction. Due to neuropathyand cytopenia, chemo changed tosecond line 5-FU pump infusion and liposomal irinotecan every 2 weekswith dose reductionuntil she progressed in the liver.  -Shewas onsecond-line Gemcitabine and Abraxane2 weeks on/1 week offon 1/20/20until progression in the liver and new lung nodule. D/c'd 03/27/19 -she started third line GTX, Xeloda 1000 mg AM/500 mg PM days 1-14 and gem/taxotere on days 4 and 11, startingon 04/10/19, tolerating well. S/p 10 cycles  - CT CAP from 10/17/19 shows mild disease progression, CA 19-9 fluctuates -She was  seen [facility] on 3/[facility], not currently enrolling -She continues GTX with Xeloda 1000 mg AM and 500  Mg PM on days 1-14, and gemcitabine/taxotere on days 4 and 11 q21 days; she tolerates well overall -CT 01/09/20 shows stable disease per RECIST definition, Dr. Burr Medico reviewed carefully with radiologist. Plan to repeat 2 months after   2. Intermittent fever spikes -secondary to tumor, chemo vs infection -Tmax usually <101 for a day after chemo -she developed persistent fever >101 after day 4 chemo of last cycle, she was instructed to hold Xeloda and go  to ED over the weekend. Work up negative. CT on 5/30 was stable. No acute process identified  -She resumed chemo and completed cycle 14, Tmax 100.9 after day 11 gem/taxotere -no signs of recurrent infection today  3.Low weight -Due to chemo and underlying metastatic cancer. -more weight loss lately, I recommend to add more boost/ensure for calories   4. Transaminitis -Due to underlying chemo and malignancy -stable  5. Pancytopenia, secondary to chemo -Anemia has improved, thrombocytopenia resolvedon promacta -neutropenia controlled with granix after day 14 Xeloda, she responds well  6. Hypokalemia, CKD Stage III - on30 mEqKCL, continue   7.Peripheral neuropathy, grade 2 -Currently on Neurontin 200 mg2 per dayand B complex -stable to slightly increased in feet; warm weather helps -Increase gabapentin to 200 mg qHS, continue 100 mg AM   8.H/oAcute cholecystitis, status post cholecystectomy tube placement 4/23per IR - surgery was not recommended due to high risk of complication, and concerns for cancer progression when holding chemotherapy for surgery.Drainwas placed during hospitalizatio -CT CAP from 12/14/18-numerus gallstones. -Drain fell out on 5/18;imaging in 12/2018 consistent with cholecystitis with cystic duct obstruction. No leak on HIDA. Holding intervention given she is asymptomatic and invasive procedure would  delay chemo. -[place] on 6/5 was done due to recurrent fever after resuming chemo; no indication for gallbladder drainage at this time -given prophylacticantibiotics periodicallydue to fever spikes during chemo -noevidence of recurrent infection  9. LE DVT -Diagnosed on December 15, 2018, when she was hospitalized for cholecystitis -Due to thrombocytopenia from chemotherapy, she is on low-dosexarelto21m daily (no loading dose) -tolerates well with some bruising but no bleeding  10.Goal of care discussion -treatment goal is palliative  Disposition:  Ms. HHeffnerappears stable. She completed cycle 14 GTX, tolerated well except persistent fever requiring ED work up, no obvious infection or need for admission. She recovered well. She began Cycle 15 Xeloda 1000 mg AM nd 500 mg PM on 02/05/20. She is tolerating treatment except intermittent fevers, inconsistent bowel pattern, and neuropathy. She will increase gabapentin to 200 mg at night and continue 100 mg AM. For bowels and painful hernia, although not obvious on exam, I recommend to increase water and keep stools soft, use abdominal support to avoid straining. She will continue topicals for hemorrhoid, if not enough we will contact GI. I doubt banding will be offered while on chemo.   CBC and CMP adequate to continue Cycle 15 day 4 gemcitabine and taxotere today. She will return for day 11 gem/taxotere next week. If she is neutropenic will add granix once she completed day 14 Xeloda. F/u in 3 weeks with next cycle.   All questions were answered. The patient knows to call the clinic with any problems, questions or concerns. No barriers to learning were detected. Total encounter time was 30 minutes.      LAlla Feeling NP 02/08/20

## 2020-02-08 NOTE — Patient Instructions (Signed)

## 2020-02-09 ENCOUNTER — Telehealth: Payer: Self-pay | Admitting: Nurse Practitioner

## 2020-02-09 NOTE — Telephone Encounter (Signed)
Scheduled appt per 6/17 los.  Pt will get an updated appt calendar at their next scheduled appt.

## 2020-02-15 ENCOUNTER — Other Ambulatory Visit: Payer: Self-pay

## 2020-02-15 ENCOUNTER — Inpatient Hospital Stay: Payer: BLUE CROSS/BLUE SHIELD

## 2020-02-15 VITALS — BP 98/67 | HR 84 | Temp 97.9°F | Resp 18 | Ht 65.0 in | Wt 103.0 lb

## 2020-02-15 DIAGNOSIS — C251 Malignant neoplasm of body of pancreas: Secondary | ICD-10-CM

## 2020-02-15 DIAGNOSIS — C787 Secondary malignant neoplasm of liver and intrahepatic bile duct: Secondary | ICD-10-CM

## 2020-02-15 DIAGNOSIS — C259 Malignant neoplasm of pancreas, unspecified: Secondary | ICD-10-CM

## 2020-02-15 DIAGNOSIS — Z95828 Presence of other vascular implants and grafts: Secondary | ICD-10-CM

## 2020-02-15 DIAGNOSIS — Z7189 Other specified counseling: Secondary | ICD-10-CM

## 2020-02-15 LAB — COMPREHENSIVE METABOLIC PANEL
ALT: 32 U/L (ref 0–44)
AST: 52 U/L — ABNORMAL HIGH (ref 15–41)
Albumin: 3.3 g/dL — ABNORMAL LOW (ref 3.5–5.0)
Alkaline Phosphatase: 275 U/L — ABNORMAL HIGH (ref 38–126)
Anion gap: 9 (ref 5–15)
BUN: 15 mg/dL (ref 6–20)
CO2: 26 mmol/L (ref 22–32)
Calcium: 9.8 mg/dL (ref 8.9–10.3)
Chloride: 103 mmol/L (ref 98–111)
Creatinine, Ser: 1 mg/dL (ref 0.44–1.00)
GFR calc Af Amer: >60 mL/min (ref >60)
GFR calc non Af Amer: >60 mL/min (ref >60)
Glucose, Bld: 137 mg/dL — ABNORMAL HIGH (ref 70–99)
Potassium: 3.6 mmol/L (ref 3.5–5.1)
Sodium: 138 mmol/L (ref 135–145)
Total Bilirubin: 0.9 mg/dL (ref 0.3–1.2)
Total Protein: 7.2 g/dL (ref 6.5–8.1)

## 2020-02-15 LAB — CBC WITH DIFFERENTIAL/PLATELET
Abs Immature Granulocytes: 0.03 10*3/uL (ref 0.00–0.07)
Basophils Absolute: 0 10*3/uL (ref 0.0–0.1)
Basophils Relative: 1 %
Eosinophils Absolute: 0 10*3/uL (ref 0.0–0.5)
Eosinophils Relative: 0 %
HCT: 28.2 % — ABNORMAL LOW (ref 36.0–46.0)
Hemoglobin: 8.9 g/dL — ABNORMAL LOW (ref 12.0–15.0)
Immature Granulocytes: 1 %
Lymphocytes Relative: 11 %
Lymphs Abs: 0.3 10*3/uL — ABNORMAL LOW (ref 0.7–4.0)
MCH: 32.2 pg (ref 26.0–34.0)
MCHC: 31.6 g/dL (ref 30.0–36.0)
MCV: 102.2 fL — ABNORMAL HIGH (ref 80.0–100.0)
Monocytes Absolute: 0.4 10*3/uL (ref 0.1–1.0)
Monocytes Relative: 14 %
Neutro Abs: 2 10*3/uL (ref 1.7–7.7)
Neutrophils Relative %: 73 %
Platelets: 315 10*3/uL (ref 150–400)
RBC: 2.76 MIL/uL — ABNORMAL LOW (ref 3.87–5.11)
RDW: 19.5 % — ABNORMAL HIGH (ref 11.5–15.5)
WBC: 2.7 10*3/uL — ABNORMAL LOW (ref 4.0–10.5)
nRBC: 0 % (ref 0.0–0.2)

## 2020-02-15 MED ORDER — SODIUM CHLORIDE 0.9 % IV SOLN
Freq: Once | INTRAVENOUS | Status: AC
  Filled 2020-02-15: qty 250

## 2020-02-15 MED ORDER — SODIUM CHLORIDE 0.9 % IV SOLN
30.0000 mg/m2 | Freq: Once | INTRAVENOUS | Status: AC
  Administered 2020-02-15: 40 mg via INTRAVENOUS
  Filled 2020-02-15: qty 4

## 2020-02-15 MED ORDER — SODIUM CHLORIDE 0.9 % IV SOLN
10.0000 mg | Freq: Once | INTRAVENOUS | Status: AC
  Administered 2020-02-15: 10 mg via INTRAVENOUS
  Filled 2020-02-15: qty 10

## 2020-02-15 MED ORDER — SODIUM CHLORIDE 0.9% FLUSH
10.0000 mL | Freq: Once | INTRAVENOUS | Status: DC
  Filled 2020-02-15: qty 10

## 2020-02-15 MED ORDER — SODIUM CHLORIDE 0.9% FLUSH
10.0000 mL | INTRAVENOUS | Status: DC | PRN
  Administered 2020-02-15: 10 mL
  Filled 2020-02-15: qty 10

## 2020-02-15 MED ORDER — SODIUM CHLORIDE 0.9 % IV SOLN
600.0000 mg/m2 | Freq: Once | INTRAVENOUS | Status: AC
  Administered 2020-02-15: 912 mg via INTRAVENOUS
  Filled 2020-02-15: qty 23.99

## 2020-02-15 MED ORDER — HEPARIN SOD (PORK) LOCK FLUSH 100 UNIT/ML IV SOLN
500.0000 [IU] | Freq: Once | INTRAVENOUS | Status: AC | PRN
  Administered 2020-02-15: 500 [IU]
  Filled 2020-02-15: qty 5

## 2020-02-15 NOTE — Patient Instructions (Signed)

## 2020-02-15 NOTE — Patient Instructions (Signed)
[facility] Discharge Instructions for Patients Receiving Chemotherapy  Today you received the following chemotherapy agents Gemzar,Taxotere To help prevent nausea and vomiting after your treatment, we encourage you to take your nausea medication as directed  If you develop nausea and vomiting that is not controlled by your nausea medication, call the clinic.   BELOW ARE SYMPTOMS THAT SHOULD BE REPORTED IMMEDIATELY:  *FEVER GREATER THAN 100.5 F  *CHILLS WITH OR WITHOUT FEVER  NAUSEA AND VOMITING THAT IS NOT CONTROLLED WITH YOUR NAUSEA MEDICATION  *UNUSUAL SHORTNESS OF BREATH  *UNUSUAL BRUISING OR BLEEDING  TENDERNESS IN MOUTH AND THROAT WITH OR WITHOUT PRESENCE OF ULCERS  *URINARY PROBLEMS  *BOWEL PROBLEMS  UNUSUAL RASH Items with * indicate a potential emergency and should be followed up as soon as possible.  Feel free to call the clinic should you have any questions or concerns. The clinic phone number is [phone].  Please show the CHEMO ALERT CARD at check-in to the Emergency Department and triage nurse.   

## 2020-02-21 NOTE — Progress Notes (Signed)
[place]   Telephone:([phone] Fax:(336) [phone]   Clinic Follow up Note   Patient Care Team: [name], MD as PCP - General (Family Medicine) [name], MD as Consulting Physician (Otolaryngology) [name], MD as Consulting Physician (Oncology)  Date of Service:  02/28/2020  CHIEF COMPLAINT: F/u of pancreatic cancer  SUMMARY OF ONCOLOGIC HISTORY: Oncology History Overview Note  Cancer Staging Pancreatic cancer [facility]) Staging form: Exocrine Pancreas, AJCC 8th Edition - Clinical stage from 08/06/2017: Stage IV (cTX, cN0, pM1) - Signed by [name], MD on 08/12/2017     Pancreatic cancer ([place])  07/23/2017 Imaging   US abdomen limited RUQ 07/23/17 IMPRESSION: 1. Cholelithiasis.  No secondary signs of acute cholecystitis. 2. Multiple solid liver masses measuring up to 6.5 cm in the left lobe of the liver, evaluation for metastatic disease is recommended. These results will be called to the ordering clinician or representative by the Radiologist Assistant, and communication documented in the PACS or zVision Dashboard.   07/23/2017 Imaging   CT Abdomen W Contrast 07/23/17 IMPRESSION: 1. Widespread metastatic disease throughout the liver. No clear primary malignancy identified in the abdomen. The pelvis was not imaged. Tissue sampling recommended. 2. Probable adenopathy superior to the pancreatic tail. No evidence of pancreatic mass. 3. Suspected incidental hemangioma inferiorly in the right hepatic lobe. 4. Nonspecific nodularity in the breasts. The patient has undergone recent (03/25/2017 and 04/01/2017) mammography and ultrasound.   07/29/2017 Initial Diagnosis   Metastasis to liver of unknown origin ([place])   07/29/2017 PET scan   PET 07/29/17  IMPRESSION: 1. Numerous bulky liver masses are hypermetabolic compatible with malignancy. 2. Accentuated activity within or along the pancreatic tail, likely represent a primary pancreatic tumor.  Consider pancreatic protocol MRI to further work this up. 3. The peripancreatic lymph node shown above the pancreatic tail is mildly hypermetabolic favoring malignancy. 4.  Prominent stool throughout the colon favors constipation. 5. 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   08/05/2017 Pathology Results   Liver Biopsy  Diagnosis 08/05/17 Liver, needle/core biopsy - CARCINOMA. - SEE COMMENT. Microscopic Comment The malignant cells are positive for cytokeratin 7. They are negative for arginase, CDX2, cytokeratin 20, estrogen receptor, GATA-3, GCDFP, Glypican 3, Hep Par 1, Napsin A, and TTF-1. This immunohistochemical is nonspecific. Possibly primary sources include pancreatobiliary and upper gastrointestinal. Radiologic correlation is necessary. Of note, organ specific markers (GATA-3, GCDFP-breast, TTF-1, Napsin A-lung, and CDX2-colon) are negative. (JBK:ecj 08/09/2017)   08/21/2017 - 02/10/2018 Chemotherapy   FOLFIRINOX every 2 weeks starting 08/21/17. Dose reduced due to neuropahty and cytopenia    10/14/2017 Imaging   CT CAP WO Contrast 10/14/17 IMPRESSION: Evidence of known numerous liver metastases without significant interval change. No other evidence of metastatic disease within the chest, abdomen or pelvis. Cholelithiasis. Tiny pericardial effusion.   12/02/2017 Genetic Testing   Negative for pathogenic mutation.  The genes analyzed were the 83 genes on [facility]'s Multi-Cancer panel (ALK, APC, ATM, AXIN2, BAP1, BARD1, BLM, BMPR1A, BRCA1, BRCA2, BRIP1, CASR, CDC73, CDH1, CDK4, CDKN1B, CDKN1C, CDKN2A, CEBPA, CHEK2, CTNNA1, DICER1, DIS3L2, EGFR, EPCAM, FH, FLCN, GATA2, GPC3, GREM1, HOXB13, HRAS, KIT, MAX, MEN1, MET, MITF, MLH1, MSH2, MSH3, MSH6, MUTYH, NBN, NF1, NF2, NTHL1, PALB2, PDGFRA, PHOX2B, PMS2, POLD1, POLE, POT1, PRKAR1A, PTCH1, PTEN, RAD50, RAD51C, RAD51D, RB1, RECQL4, RET, RUNX1, SDHA, SDHAF2, SDHB, SDHC, SDHD, SMAD4, SMARCA4, SMARCB1, SMARCE1, STK11, SUFU, TERC, TERT,  TMEM127, TP53, TSC1, TSC2, VHL, WRN, WT1).   12/17/2017 PET scan   IMPRESSION: 1. Although the liver metastases  are still visible on the CT images, they are significantly smaller and retain no abnormal metabolic activity, consistent with response to therapy. 2. No abnormal activity within the pancreas. 3. No disease progression identified. 4. Cholelithiasis.   02/21/2018 PET scan   02/21/2018 PET Scan  IMPRESSION: 1. There are 2 new small nodules identified within the right lung which measure up to 5 mm. These are too small to reliably characterize by PET-CT, but warrant close interval follow-up. 2. Again noted are multifocal liver metastasis. The target lesion within the left lobe is slightly decreased in size when compared with the previous exam. Similar to previous exam there is no abnormal hypermetabolism above background liver activity identified within the liver lesions. 3. Gallstones.   02/23/2018 - 09/04/2018 Chemotherapy   5-FU pump infusion and liposomal irinotecan, every 2 weeks.  First cycle dose reduced due to cytopenia in the travel. Stopped on 08/25/2018 due to disease progression.    05/17/2018 Imaging   05/17/2018 PET Scan IMPRESSION: 1. Mixed response. The 2 small right lung nodules have decreased in size in the interval. Single focus of increased uptake within the liver is new from 02/21/2018 and concerning for recurrent metabolically active liver metastasis. 2. Splenomegaly.  New from previous exam. 3. Diffusely increased bone marrow activity, likely reflecting treatment related changes.   09/01/2018 PET scan   PET 09/01/18 IMPRESSION: 1. Unfortunately there recurrence of hepatic metastasis with multiple new hypermetabolic lesions in LEFT and RIGHT hepatic lobe. 2. New extra hepatic site of malignancy which appears associated the mid pancreas. Difficult to define lesion on noncontrast exam. 3. Diffuse marrow activity is favored benign. 4. No evidence of pulmonary  metastasis.   09/12/2018 - 03/27/2019 Chemotherapy   second-line Gemcitabine and Abraxane for 2 weeks on, 1 week off starting 09/12/2018. Due to neuropathy, I will start her with dose reduced Abraxane. Stopped after 03/27/19 due to disease progression.    12/14/2018 Imaging   CT CAP  IMPRESSION: CT demonstrates acute cholecystitis, with choledocholithiasis of the cystic duct.  These results were discussed by telephone at the time of interpretation on 12/14/2018 at 5:05 pm with Dr. Burr Medico.  Redemonstration of known liver metastases, with positive response to therapy, interval reduction in size of all lesions, with multiple demonstrating significant internal necrosis.  No acute finding of the chest.  Ancillary findings as above.   03/21/2019 Imaging   CT CAP W Contrast IMPRESSION: 1. Numerous rim enhancing liver metastases show overall trend towards mild progression although 1 of the index lesions is stable in the interval. 2. Interval development of portocaval lymphadenopathy concerning for disease progression. 3. Slight progression of main duct dilatation in the pancreas with abrupt cut off in the region of the body. 4. Interval development of a 9 mm subpleural nodule at the right base along the posterior hemidiaphragm. Close attention on follow-up recommended as metastatic disease not excluded. 5. New splenomegaly. 6. Cholelithiasis with diffuse gallbladder wall thickening. Gallbladder wall thickening is decreased in the interval. Stones and sludge noted in the gallbladder lumen.   04/09/2019 Imaging   CT AP W Contrast 04/09/19  IMPRESSION: 1. Multiple gallstones in lumen gallbladder without evidence acute cholecystitis. No significant change from prior. No biliary duct dilatation. Common bile duct normal. 2. Widespread hepatic metastasis unchanged from comparison CT. 3. Chronic pancreatic duct dilatation in the body and tail unchanged. 4. No bowel obstruction. 5. Small  amount of intraperitoneal free fluid unchanged.   04/10/2019 -  Chemotherapy   Third-line GTX with Xeloda  1023m BID day 1-14, off for 1 week with Docetaxel and Gemcitbine on day 4 and day 11 every 3 weeks starting oral chemo on 04/10/19.    07/03/2019 Imaging   CT CAP W Contrast  IMPRESSION: 1. Overall stable diffuse hepatic metastatic disease. No new/progressive findings. 2. Stable to slightly larger periportal lymph node. 3. Stable CT appearance of the pancreas. Abrupt cut off of a dilated main pancreatic duct in the body/head junction region without obvious pancreatic lesion. 4. No pulmonary nodules are identified at the lung bases. 5. Stable mild splenomegaly. 6. No new/acute findings.     10/17/2019 Imaging   CT CAP W Contrast  IMPRESSION: 1. Overall mild progression of metastatic disease. Portacaval lymphadenopathy has increased. Enlarging lesion along the posterior right diaphragm is suspicious for growing right pleural metastasis. 2. Bulky liver metastatic disease is relatively stable with mild mixed changes as detailed. 3. Pancreatic body tumor is stable. 4. Moderate splenomegaly, increased. 5. Aortic Atherosclerosis (ICD10-I70.0). Additional chronic findings as detailed.       01/09/2020 Imaging   CT CHEST/ABD/PEL IMPRESSION 1. No specific evidence of metastatic disease within the chest. 2. New small right pleural effusion. 1. Relatively similar size of pancreatic primary. 2. Although direct comparison of liver metastasis is challenging secondary to differences in bolus timing, mild progression is identified. 3. Mild progression of abdominal adenopathy. 4. Persistent hepatosplenomegaly. 5. Trace perihepatic ascites, similar. 6. Borderline small bowel dilatation, without focal transition point. Ileus versus low-grade partial small bowel obstruction. 7. Cholelithiasis.   01/21/2020 Imaging   CT AP  IMPRESSION: 1. No significant change of pancreatic  fullness/mass, innumerable hepatic masses, abdominal lymphadenopathy and small amount of ascites compatible with metastatic disease. 2. Unchanged splenomegaly. 3. Decreased small RIGHT pleural effusion. Bibasilar atelectasis again noted. 4. Aortic Atherosclerosis (ICD10-I70.0).     [place]  08/11/2017 Initial Diagnosis   Pancreatic cancer metastasized to liver ([facility]      CURRENT THERAPY:  Third-line GTX with Xeloda10058mID day 1-14, off for 1 week with Docetaxel3020m2and Gemcitabine750m22mon day 4 and day 11 every 3 weeks starting oral chemo on 04/10/19.Starting 07/06/19 she will reduce Xeloda to 1000mg62mthe AM and 500mg 82mhe PM due to low blood counts.  INTERVAL HISTORY:  JaniceDANAMARIE MINAMIre for a follow up and treatment. She presents to the clinic alone. She notes she is fair. She notes her last few treatments were fair as well. She notes side effects are the same but more intense with neuropathy and early Satiety and bleeding gums only with brushing. She notes her gum irritation will last only while brushing. She notes feeling uncomfortable from her external hemorrhoids. No GI bleeding recently. She takes stool softener so she does not have to push with BMs. I reviewed her medication list with her. She notes she rarely needs tramadol.     REVIEW OF SYSTEMS:   Constitutional: Denies fevers, chills or abnormal weight loss Eyes: Denies blurriness of vision Ears, nose, mouth, throat, and face: Denies mucositis or sore throat (+) gum bleeding, irritation Respiratory: Denies cough, dyspnea or wheezes Cardiovascular: Denies palpitation, chest discomfort or lower extremity swelling Gastrointestinal:  Denies nausea, heartburn or change in bowel habits (+) Early Satiety (+) External hemorrhoids  Skin: Denies abnormal skin rashes Lymphatics: Denies new lymphadenopathy or easy bruising Neurological: (+) Neuropathy in her feet more than  hands.  Behavioral/Psych: Mood is stable, no new changes  All other systems were reviewed with the patient and are negative.  MEDICAL HISTORY:  Past Medical History:  Diagnosis Date   Diarrhea of presumed infectious origin 01/12/2018   Gastroenteritis 01/12/2018   Genetic testing 12/02/2017   Multi-Cancer panel (83 genes) @ [facility] - No pathogenic mutations detected   Meniere disease 2016   met pancreatic ca to liver dx'd 07/2017   Pancreatitis     SURGICAL HISTORY: Past Surgical History:  Procedure Laterality Date   CERVICAL ABLATION     ENDOMETRIAL ABLATION  2011   IR CHOLANGIOGRAM EXISTING TUBE  01/09/2019   IR FLUORO GUIDE PORT INSERTION RIGHT  08/12/2017   IR PERC CHOLECYSTOSTOMY  12/15/2018   IR US GUIDE VASC ACCESS RIGHT  08/12/2017   MANDIBLE SURGERY  1999    I have reviewed the social history and family history with the patient and they are unchanged from previous note.  ALLERGIES:  has No Known Allergies.  MEDICATIONS:  Current Outpatient Medications  Medication Sig Dispense Refill   b complex vitamins tablet Take 1 tablet by mouth daily.     capecitabine (XELODA) 500 MG tablet TAKE 2 TABLETS BY MOUTH IN THE MORNING AND 1 TABLET IN THE EVENING, EVERY 12 HOURS, WITHIN 30 MINUTES OF MEALS ON DAYS 1 TO 14 OF EACH 21 DAY CYCLE (Patient taking differently: 500-1,000 mg See admin instructions. Takes 2 tablets by mouth in the morning and 1 tablet in the ebvening, every 12 hours, within 30 minutes of meals on days 1 to 14 of each 21 day cycle.) 42 tablet 3   Cholecalciferol (VITAMIN D3 GUMMIES ADULT) 25 MCG (1000 UT) CHEW Chew 2,000 Units by mouth daily.      cyclobenzaprine (FLEXERIL) 5 MG tablet Take 1 tablet (5 mg total) by mouth 3 (three) times daily as needed for muscle spasms. 30 tablet 0   gabapentin (NEURONTIN) 100 MG capsule Take 1 capsule (100 mg total) by mouth 3 (three) times daily. (Patient taking differently: Take 100 mg by mouth in the morning and  at bedtime. ) 270 capsule 2   lidocaine-prilocaine (EMLA) cream Apply 1 application topically as needed. (Patient taking differently: Apply 1 application topically as needed (for pain). ) 30 g 2   loperamide (IMODIUM) 2 MG capsule Take 1 capsule (2 mg total) by mouth as needed for diarrhea or loose stools. 30 capsule 0   loratadine (CLARITIN) 10 MG tablet Take 10 mg by mouth daily as needed for allergies.     magic mouthwash w/lidocaine SOLN Take 10 mLs by mouth 3 (three) times daily as needed for mouth pain. Swish and spit 10 ML by mouth 3 times daily as needed for mouth pain. (Patient not taking: Reported on 01/21/2020) 240 mL 0   meclizine (ANTIVERT) 25 MG tablet Take 25 mg by mouth 3 (three) times daily as needed for dizziness.     ondansetron (ZOFRAN-ODT) 8 MG disintegrating tablet TAKE 1 TABLET BY MOUTH EVERY 8 HOURS AS NEEDED FOR NAUSE OR VOMITING (Patient taking differently: Take 8 mg by mouth every 8 (eight) hours as needed for nausea or vomiting. ) 40 tablet 1   potassium chloride (KLOR-CON) 10 MEQ tablet Take 1 tablet (10 mEq total) by mouth 4 (four) times daily. 360 tablet 2   prochlorperazine (COMPAZINE) 10 MG tablet Take 1 tablet (10 mg total) by mouth every 6 (six) hours as needed for nausea or vomiting. (Patient not taking: Reported on 01/21/2020) 40 tablet 2   PROMACTA 50 MG tablet TAKE 1 TABLET BY MOUTH 1 TIME A DAY. (Patient taking differently: Take  50 mg by mouth daily. ) 30 tablet 1   Rivaroxaban (XARELTO) 15 MG TABS tablet TAKE 1 TABLET(15 MG) BY MOUTH DAILY (Patient taking differently: Take 15 mg by mouth daily. ) 90 tablet 1   traMADol (ULTRAM) 50 MG tablet Take 1 tablet (50 mg total) by mouth every 6 (six) hours as needed for moderate pain or severe pain. 30 tablet 0   No current facility-administered medications for this visit.   Facility-Administered Medications Ordered in Other Visits  Medication Dose Route Frequency Provider Last Rate Last Admin   sodium  chloride flush (NS) 0.9 % injection 10 mL  10 mL Intracatheter Once [name], MD        PHYSICAL EXAMINATION: ECOG PERFORMANCE STATUS: 2 - Symptomatic, <50% confined to bed  Vitals:   02/28/20 1203  BP: 101/68  Pulse: 87  Resp: 18  Temp: 98.8 F (37.1 C)  SpO2: 100%   Filed Weights   02/28/20 1203  Weight: 102 lb 1.6 oz (46.3 kg)    Due to COVID19 we will limit examination to appearance. Patient had no complaints.  GENERAL:alert, no distress and comfortable SKIN: skin color normal, no rashes or significant lesions EYES: normal, Conjunctiva are pink and non-injected, sclera clear  NEURO: alert & oriented x 3 with fluent speech   LABORATORY DATA:  I have reviewed the data as listed CBC Latest Ref Rng & Units 02/28/2020 02/15/2020 02/08/2020  WBC 4.0 - 10.5 K/uL 6.0 2.7(L) 5.0  Hemoglobin 12.0 - 15.0 g/dL 8.8(L) 8.9(L) 9.3(L)  Hematocrit 36 - 46 % 27.6(L) 28.2(L) 29.1(L)  Platelets 150 - 400 K/uL 358 315 386     CMP Latest Ref Rng & Units 02/28/2020 02/15/2020 02/08/2020  Glucose 70 - 99 mg/dL 108(H) 137(H) 111(H)  BUN 6 - 20 mg/dL 14 15 13   Creatinine 0.44 - 1.00 mg/dL 0.93 1.00 0.96  Sodium 135 - 145 mmol/L 140 138 139  Potassium 3.5 - 5.1 mmol/L 3.5 3.6 3.6  Chloride 98 - 111 mmol/L 104 103 105  CO2 22 - 32 mmol/L 25 26 26   Calcium 8.9 - 10.3 mg/dL 9.2 9.8 9.3  Total Protein 6.5 - 8.1 g/dL 6.9 7.2 7.0  Total Bilirubin 0.3 - 1.2 mg/dL 1.1 0.9 1.1  Alkaline Phos 38 - 126 U/L 250(H) 275(H) 298(H)  AST 15 - 41 U/L 54(H) 52(H) 50(H)  ALT 0 - 44 U/L 23 32 25      RADIOGRAPHIC STUDIES: I have personally reviewed the radiological images as listed and agreed with the findings in the report. No results found.   ASSESSMENT & PLAN:  Morgan Dawson is a 59 y.o. female with    1. 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 She progressed on this based on 08/2018 PET scan. -She mildly progressed onsecond-line Gemcitabine and Abraxanein liver and new lung nodule.  -She is currently onthird-line GTX with Xeloda 1042m BID 2 weeks on/1 week off andDocetaxelandGemcitabineon day 4 and 11 q3weeks starting 04/10/19.Granix has been added to week 3 due toneutropenia. Starting 07/03/19 Xeloda has been decreased to10051min the AM and 5005mn the [place]-Ireached out to [facility] and [facility] clinical [facility] options. I have reached out to Dr. McRAleatha Borer [facility]. We discussed the washout time before she can participate clinical trial, which is usually 4 weeks. We  discussed high screening failure for clinical trial, and high possibility of disease progression while she is off treatment and waiting to participate clinical trial. She voiced good understanding. -I previously communicated withDr. Dennison Nancy at [facility], although she is not eligible for their trial, she recommended testing NRG1 fusion to see if she is a candidate for targeted therapy. -FO also listed some targeted therapy options based on her RAF mutation,such as regorafenib, sorafenib, MEK inhibitor, I will look into the clinical data to see if we can use one of them  -In the meantime will continue GTX withXeloda1035m in the AM and 5075min the PMandday 4Docetaxel/Gemcitabine -S/p C15 she still has symptoms of early Satiety, gum bleeding/irritation with brushing and neuropathy. These symptoms has become more intense with more treatment. I recommend daily non alcohol mouth wash and f/u with dentist for cleanings. She can also see ophthalmologist for her recent change in vision.  -Labs reviewed, Hg 8.8, BG 108, albumin 3.4, AST 54, Alk Phos 250. Overall adequate to proceed with C16D4 GT today. She will start C16 Xeloda started on 7/4  -Will have CT CAP in 2 weeks before virtual visit.  -if her next scan  shows cancer progression, [facility]. [facility], which she is not interested. She notes her quality of life is still important to her, but is also interested in continuing treatment for now.   2.Abdominal pain -Pain is not often. She rarely uses Tramadol.   3.Malnutrition -Secondary to chemo and underlying metastatic cancer. -Continue nutritional supplement and f/u with dietician. -Appetite and eatinghas been fair but she has been able to maintain weight well.Stable.  4. Transaminitis -Due to underlying chemo and malignancy -Resolved except Alk Phos, stable.   5. Pancytopenia, secondary to chemo -Required chemo dose reductions. -S/p blood transfusion as needed withHg<8. Last on 09/20/18 -OnPromacta 5024maily.She is responding well to Granix.  -She only has moderate anemia currently.   6. Hypokalemia, CKD Stage III -Sheis currently on KCL10m31mID. Stable.   7.Peripheral neuropathy, grade 2  -Currently on Neurontin 200 mg at night and Bcomplex.She used ice bags during infusion. -Has progressed with more treatment. This is more present in her feet than her hands.  -Will further reduce dose Docetaxel dose starting with C16.   8.H/o Acute cholecystitis, status post cholecystectomy tube placement 4/23/20per IR -She was seen by surgery during herhospitalization, surgery was not recommended due to high risk of complication, and concerns for cancer progression when holding chemotherapy for surgery -Her CT CAP from 12/14/18 shows she has numerus gallstones. Will monitor for any inflammation. -Imaging in 12/2018 consistent with cholecystitis with cystic duct obstruction. No leak on HIDA. -noevidence of recurrent infectionrecently.  9. LE DVT -Diagnosed on December 15, 2018, when she was hospitalized for cholecystitis.  -On low-doseXarelto15mg68mily.   10.Goal of care discussion, DNR/DNI -We again discussed the incurable nature of her cancer, and the overall poor prognosis, especially if she does not have good response to chemotherapy or progress on chemo -The patient understands the goal of care is palliative. -I recommend DNR/DNI, she agreed. She has living will set up.   Plan -She began Cycle 16 Xeloda 1000 mg AM and 500 mg PM on 02/25/20, will continue for 14 days -Labs reviewed and adequate to proceed with chemo gemcitabine and docetaxel today, she will return next week for day 11 treatment. -Mychart visit on  7/23 with lab and CT CAP w contrast a few days before.  -will check clinical trial status at [facility], I sent a email to Dr. Altamease Oiler  -Pt agreed with DNR today, I signed the order    No problem-specific Assessment & Plan notes found for this encounter.   No orders of the defined types were placed in this encounter.  All questions were answered. The patient knows to call the clinic with any problems, questions or concerns. No barriers to learning was detected. Pt had several question which I answered to her satisfaction. The total time spent in the appointment was 40 minutes.     [name], MD 02/28/2020   I, Joslyn Devon, am acting as scribe for [name], MD.   I have reviewed the above documentation for accuracy and completeness, and I agree with the above.

## 2020-02-28 ENCOUNTER — Inpatient Hospital Stay: Payer: BLUE CROSS/BLUE SHIELD

## 2020-02-28 ENCOUNTER — Other Ambulatory Visit: Payer: Self-pay

## 2020-02-28 ENCOUNTER — Inpatient Hospital Stay: Payer: BLUE CROSS/BLUE SHIELD | Attending: Hematology

## 2020-02-28 ENCOUNTER — Encounter: Payer: Self-pay | Admitting: Hematology

## 2020-02-28 ENCOUNTER — Inpatient Hospital Stay (HOSPITAL_BASED_OUTPATIENT_CLINIC_OR_DEPARTMENT_OTHER): Payer: BLUE CROSS/BLUE SHIELD | Admitting: Hematology

## 2020-02-28 VITALS — BP 101/68 | HR 87 | Temp 98.8°F | Resp 18 | Ht 65.0 in | Wt 102.1 lb

## 2020-02-28 DIAGNOSIS — E46 Unspecified protein-calorie malnutrition: Secondary | ICD-10-CM | POA: Diagnosis not present

## 2020-02-28 DIAGNOSIS — C251 Malignant neoplasm of body of pancreas: Secondary | ICD-10-CM

## 2020-02-28 DIAGNOSIS — E876 Hypokalemia: Secondary | ICD-10-CM | POA: Diagnosis not present

## 2020-02-28 DIAGNOSIS — G629 Polyneuropathy, unspecified: Secondary | ICD-10-CM | POA: Insufficient documentation

## 2020-02-28 DIAGNOSIS — Z7189 Other specified counseling: Secondary | ICD-10-CM

## 2020-02-28 DIAGNOSIS — D61810 Antineoplastic chemotherapy induced pancytopenia: Secondary | ICD-10-CM | POA: Diagnosis not present

## 2020-02-28 DIAGNOSIS — C787 Secondary malignant neoplasm of liver and intrahepatic bile duct: Secondary | ICD-10-CM | POA: Insufficient documentation

## 2020-02-28 DIAGNOSIS — D6481 Anemia due to antineoplastic chemotherapy: Secondary | ICD-10-CM

## 2020-02-28 DIAGNOSIS — T451X5A Adverse effect of antineoplastic and immunosuppressive drugs, initial encounter: Secondary | ICD-10-CM

## 2020-02-28 DIAGNOSIS — K644 Residual hemorrhoidal skin tags: Secondary | ICD-10-CM | POA: Insufficient documentation

## 2020-02-28 DIAGNOSIS — Z79899 Other long term (current) drug therapy: Secondary | ICD-10-CM | POA: Diagnosis not present

## 2020-02-28 DIAGNOSIS — C259 Malignant neoplasm of pancreas, unspecified: Secondary | ICD-10-CM

## 2020-02-28 DIAGNOSIS — Z5111 Encounter for antineoplastic chemotherapy: Secondary | ICD-10-CM | POA: Insufficient documentation

## 2020-02-28 DIAGNOSIS — Z7901 Long term (current) use of anticoagulants: Secondary | ICD-10-CM | POA: Insufficient documentation

## 2020-02-28 DIAGNOSIS — I82402 Acute embolism and thrombosis of unspecified deep veins of left lower extremity: Secondary | ICD-10-CM | POA: Diagnosis not present

## 2020-02-28 DIAGNOSIS — R109 Unspecified abdominal pain: Secondary | ICD-10-CM | POA: Insufficient documentation

## 2020-02-28 DIAGNOSIS — Z95828 Presence of other vascular implants and grafts: Secondary | ICD-10-CM

## 2020-02-28 DIAGNOSIS — R7401 Elevation of levels of liver transaminase levels: Secondary | ICD-10-CM | POA: Diagnosis not present

## 2020-02-28 DIAGNOSIS — N183 Chronic kidney disease, stage 3 unspecified: Secondary | ICD-10-CM | POA: Insufficient documentation

## 2020-02-28 LAB — COMPREHENSIVE METABOLIC PANEL
ALT: 23 U/L (ref 0–44)
AST: 54 U/L — ABNORMAL HIGH (ref 15–41)
Albumin: 3.4 g/dL — ABNORMAL LOW (ref 3.5–5.0)
Alkaline Phosphatase: 250 U/L — ABNORMAL HIGH (ref 38–126)
Anion gap: 11 (ref 5–15)
BUN: 14 mg/dL (ref 6–20)
CO2: 25 mmol/L (ref 22–32)
Calcium: 9.2 mg/dL (ref 8.9–10.3)
Chloride: 104 mmol/L (ref 98–111)
Creatinine, Ser: 0.93 mg/dL (ref 0.44–1.00)
GFR calc Af Amer: >60 mL/min (ref >60)
GFR calc non Af Amer: >60 mL/min (ref >60)
Glucose, Bld: 108 mg/dL — ABNORMAL HIGH (ref 70–99)
Potassium: 3.5 mmol/L (ref 3.5–5.1)
Sodium: 140 mmol/L (ref 135–145)
Total Bilirubin: 1.1 mg/dL (ref 0.3–1.2)
Total Protein: 6.9 g/dL (ref 6.5–8.1)

## 2020-02-28 LAB — CBC WITH DIFFERENTIAL/PLATELET
Abs Immature Granulocytes: 0.05 10*3/uL (ref 0.00–0.07)
Basophils Absolute: 0 10*3/uL (ref 0.0–0.1)
Basophils Relative: 1 %
Eosinophils Absolute: 0.1 10*3/uL (ref 0.0–0.5)
Eosinophils Relative: 1 %
HCT: 27.6 % — ABNORMAL LOW (ref 36.0–46.0)
Hemoglobin: 8.8 g/dL — ABNORMAL LOW (ref 12.0–15.0)
Immature Granulocytes: 1 %
Lymphocytes Relative: 5 %
Lymphs Abs: 0.3 10*3/uL — ABNORMAL LOW (ref 0.7–4.0)
MCH: 33.2 pg (ref 26.0–34.0)
MCHC: 31.9 g/dL (ref 30.0–36.0)
MCV: 104.2 fL — ABNORMAL HIGH (ref 80.0–100.0)
Monocytes Absolute: 0.6 10*3/uL (ref 0.1–1.0)
Monocytes Relative: 11 %
Neutro Abs: 4.9 10*3/uL (ref 1.7–7.7)
Neutrophils Relative %: 81 %
Platelets: 358 10*3/uL (ref 150–400)
RBC: 2.65 MIL/uL — ABNORMAL LOW (ref 3.87–5.11)
RDW: 21.4 % — ABNORMAL HIGH (ref 11.5–15.5)
WBC: 6 10*3/uL (ref 4.0–10.5)
nRBC: 0 % (ref 0.0–0.2)

## 2020-02-28 MED ORDER — SODIUM CHLORIDE 0.9 % IV SOLN: 30.0000 mg/m2 | Freq: Once | INTRAVENOUS | Status: DC

## 2020-02-28 MED ORDER — SODIUM CHLORIDE 0.9% FLUSH
10.0000 mL | Freq: Once | INTRAVENOUS | Status: AC
  Administered 2020-02-28: 10 mL
  Filled 2020-02-28: qty 10

## 2020-02-28 MED ORDER — SODIUM CHLORIDE 0.9 % IV SOLN
600.0000 mg/m2 | Freq: Once | INTRAVENOUS | Status: AC
  Administered 2020-02-28: 912 mg via INTRAVENOUS
  Filled 2020-02-28: qty 23.99

## 2020-02-28 MED ORDER — HEPARIN SOD (PORK) LOCK FLUSH 100 UNIT/ML IV SOLN
500.0000 [IU] | Freq: Once | INTRAVENOUS | Status: AC | PRN
  Administered 2020-02-28: 500 [IU]
  Filled 2020-02-28: qty 5

## 2020-02-28 MED ORDER — SODIUM CHLORIDE 0.9% FLUSH
10.0000 mL | INTRAVENOUS | Status: DC | PRN
  Administered 2020-02-28: 10 mL
  Filled 2020-02-28: qty 10

## 2020-02-28 MED ORDER — SODIUM CHLORIDE 0.9 % IV SOLN
Freq: Once | INTRAVENOUS | Status: AC
  Filled 2020-02-28: qty 250

## 2020-02-28 MED ORDER — SODIUM CHLORIDE 0.9 % IV SOLN
10.0000 mg | Freq: Once | INTRAVENOUS | Status: AC
  Administered 2020-02-28: 10 mg via INTRAVENOUS
  Filled 2020-02-28: qty 10

## 2020-02-28 MED ORDER — SODIUM CHLORIDE 0.9 % IV SOLN
30.0000 mg/m2 | Freq: Once | INTRAVENOUS | Status: AC
  Administered 2020-02-28: 40 mg via INTRAVENOUS
  Filled 2020-02-28: qty 4

## 2020-02-28 NOTE — Progress Notes (Signed)
Ok to cont same dose Taxotere today per Dr. Burr Medico.  May further reduce next cycle.  Kennith Center, Pharm.D., CPP 02/28/2020@1 :18 PM

## 2020-02-28 NOTE — Patient Instructions (Signed)
[facility] Discharge Instructions for Patients Receiving Chemotherapy  Today you received the following chemotherapy agents Gemzar,Taxotere To help prevent nausea and vomiting after your treatment, we encourage you to take your nausea medication as directed  If you develop nausea and vomiting that is not controlled by your nausea medication, call the clinic.   BELOW ARE SYMPTOMS THAT SHOULD BE REPORTED IMMEDIATELY:  *FEVER GREATER THAN 100.5 F  *CHILLS WITH OR WITHOUT FEVER  NAUSEA AND VOMITING THAT IS NOT CONTROLLED WITH YOUR NAUSEA MEDICATION  *UNUSUAL SHORTNESS OF BREATH  *UNUSUAL BRUISING OR BLEEDING  TENDERNESS IN MOUTH AND THROAT WITH OR WITHOUT PRESENCE OF ULCERS  *URINARY PROBLEMS  *BOWEL PROBLEMS  UNUSUAL RASH Items with * indicate a potential emergency and should be followed up as soon as possible.  Feel free to call the clinic should you have any questions or concerns. The clinic phone number is [phone].  Please show the CHEMO ALERT CARD at check-in to the Emergency Department and triage nurse.   

## 2020-02-28 NOTE — Patient Instructions (Signed)

## 2020-02-29 ENCOUNTER — Telehealth: Payer: Self-pay | Admitting: Hematology

## 2020-02-29 LAB — CANCER ANTIGEN 19-9: CA 19-9: 137 U/mL — ABNORMAL HIGH (ref 0–35)

## 2020-02-29 NOTE — Telephone Encounter (Signed)
Scheduled per 7/7 los. Pt is aware of appts added.

## 2020-03-06 ENCOUNTER — Inpatient Hospital Stay: Payer: BLUE CROSS/BLUE SHIELD

## 2020-03-06 ENCOUNTER — Other Ambulatory Visit: Payer: Self-pay

## 2020-03-06 VITALS — BP 115/70 | HR 86 | Temp 98.3°F | Resp 18

## 2020-03-06 DIAGNOSIS — C787 Secondary malignant neoplasm of liver and intrahepatic bile duct: Secondary | ICD-10-CM

## 2020-03-06 DIAGNOSIS — C259 Malignant neoplasm of pancreas, unspecified: Secondary | ICD-10-CM

## 2020-03-06 DIAGNOSIS — C251 Malignant neoplasm of body of pancreas: Secondary | ICD-10-CM

## 2020-03-06 DIAGNOSIS — Z7189 Other specified counseling: Secondary | ICD-10-CM

## 2020-03-06 DIAGNOSIS — Z95828 Presence of other vascular implants and grafts: Secondary | ICD-10-CM

## 2020-03-06 DIAGNOSIS — K1230 Oral mucositis (ulcerative), unspecified: Secondary | ICD-10-CM

## 2020-03-06 LAB — COMPREHENSIVE METABOLIC PANEL
ALT: 23 U/L (ref 0–44)
AST: 52 U/L — ABNORMAL HIGH (ref 15–41)
Albumin: 3.2 g/dL — ABNORMAL LOW (ref 3.5–5.0)
Alkaline Phosphatase: 244 U/L — ABNORMAL HIGH (ref 38–126)
Anion gap: 9 (ref 5–15)
BUN: 14 mg/dL (ref 6–20)
CO2: 26 mmol/L (ref 22–32)
Calcium: 9.8 mg/dL (ref 8.9–10.3)
Chloride: 105 mmol/L (ref 98–111)
Creatinine, Ser: 0.84 mg/dL (ref 0.44–1.00)
GFR calc Af Amer: >60 mL/min (ref >60)
GFR calc non Af Amer: >60 mL/min (ref >60)
Glucose, Bld: 138 mg/dL — ABNORMAL HIGH (ref 70–99)
Potassium: 3.5 mmol/L (ref 3.5–5.1)
Sodium: 140 mmol/L (ref 135–145)
Total Bilirubin: 0.8 mg/dL (ref 0.3–1.2)
Total Protein: 6.8 g/dL (ref 6.5–8.1)

## 2020-03-06 LAB — CBC WITH DIFFERENTIAL/PLATELET
Abs Immature Granulocytes: 0.02 10*3/uL (ref 0.00–0.07)
Basophils Absolute: 0 10*3/uL (ref 0.0–0.1)
Basophils Relative: 1 %
Eosinophils Absolute: 0 10*3/uL (ref 0.0–0.5)
Eosinophils Relative: 0 %
HCT: 25 % — ABNORMAL LOW (ref 36.0–46.0)
Hemoglobin: 8.1 g/dL — ABNORMAL LOW (ref 12.0–15.0)
Immature Granulocytes: 1 %
Lymphocytes Relative: 8 %
Lymphs Abs: 0.3 10*3/uL — ABNORMAL LOW (ref 0.7–4.0)
MCH: 33.3 pg (ref 26.0–34.0)
MCHC: 32.4 g/dL (ref 30.0–36.0)
MCV: 102.9 fL — ABNORMAL HIGH (ref 80.0–100.0)
Monocytes Absolute: 0.4 10*3/uL (ref 0.1–1.0)
Monocytes Relative: 11 %
Neutro Abs: 2.6 10*3/uL (ref 1.7–7.7)
Neutrophils Relative %: 79 %
Platelets: 248 10*3/uL (ref 150–400)
RBC: 2.43 MIL/uL — ABNORMAL LOW (ref 3.87–5.11)
RDW: 20.4 % — ABNORMAL HIGH (ref 11.5–15.5)
WBC: 3.3 10*3/uL — ABNORMAL LOW (ref 4.0–10.5)
nRBC: 0 % (ref 0.0–0.2)

## 2020-03-06 MED ORDER — SODIUM CHLORIDE 0.9 % IV SOLN
600.0000 mg/m2 | Freq: Once | INTRAVENOUS | Status: AC
  Administered 2020-03-06: 912 mg via INTRAVENOUS
  Filled 2020-03-06: qty 23.99

## 2020-03-06 MED ORDER — HEPARIN SOD (PORK) LOCK FLUSH 100 UNIT/ML IV SOLN
500.0000 [IU] | Freq: Once | INTRAVENOUS | Status: AC | PRN
  Administered 2020-03-06: 500 [IU]
  Filled 2020-03-06: qty 5

## 2020-03-06 MED ORDER — SODIUM CHLORIDE 0.9% FLUSH
10.0000 mL | INTRAVENOUS | Status: DC | PRN
  Administered 2020-03-06: 10 mL
  Filled 2020-03-06: qty 10

## 2020-03-06 MED ORDER — SODIUM CHLORIDE 0.9 % IV SOLN
30.0000 mg/m2 | Freq: Once | INTRAVENOUS | Status: AC
  Administered 2020-03-06: 40 mg via INTRAVENOUS
  Filled 2020-03-06: qty 4

## 2020-03-06 MED ORDER — LIDOCAINE-PRILOCAINE 2.5-2.5 % EX CREA: 1.0000 "application " | TOPICAL_CREAM | CUTANEOUS | 2 refills | Status: AC | PRN

## 2020-03-06 MED ORDER — SODIUM CHLORIDE 0.9% FLUSH
10.0000 mL | Freq: Once | INTRAVENOUS | Status: AC
  Administered 2020-03-06: 10 mL
  Filled 2020-03-06: qty 10

## 2020-03-06 MED ORDER — SODIUM CHLORIDE 0.9 % IV SOLN
Freq: Once | INTRAVENOUS | Status: AC
  Filled 2020-03-06: qty 250

## 2020-03-06 MED ORDER — MAGIC MOUTHWASH W/LIDOCAINE: 10.0000 mL | Freq: Three times a day (TID) | ORAL | 1 refills | Status: AC | PRN

## 2020-03-06 MED ORDER — SODIUM CHLORIDE 0.9 % IV SOLN
10.0000 mg | Freq: Once | INTRAVENOUS | Status: AC
  Administered 2020-03-06: 10 mg via INTRAVENOUS
  Filled 2020-03-06: qty 10

## 2020-03-06 NOTE — Patient Instructions (Signed)
[facility] Center Discharge Instructions for Patients Receiving Chemotherapy  Today you received the following chemotherapy agents Gemzar,Taxotere To help prevent nausea and vomiting after your treatment, we encourage you to take your nausea medication as directed  If you develop nausea and vomiting that is not controlled by your nausea medication, call the clinic.   BELOW ARE SYMPTOMS THAT SHOULD BE REPORTED IMMEDIATELY:  *FEVER GREATER THAN 100.5 F  *CHILLS WITH OR WITHOUT FEVER  NAUSEA AND VOMITING THAT IS NOT CONTROLLED WITH YOUR NAUSEA MEDICATION  *UNUSUAL SHORTNESS OF BREATH  *UNUSUAL BRUISING OR BLEEDING  TENDERNESS IN MOUTH AND THROAT WITH OR WITHOUT PRESENCE OF ULCERS  *URINARY PROBLEMS  *BOWEL PROBLEMS  UNUSUAL RASH Items with * indicate a potential emergency and should be followed up as soon as possible.  Feel free to call the clinic should you have any questions or concerns. The clinic phone number is [phone].  Please show the CHEMO ALERT CARD at check-in to the Emergency Department and triage nurse.   

## 2020-03-06 NOTE — Progress Notes (Signed)
Please requested Magic Mouthwash and EMLA cream be sent into [facility] [address].  I electronically sent in EMLA and phoned in Spring.

## 2020-03-06 NOTE — Patient Instructions (Signed)

## 2020-03-08 ENCOUNTER — Other Ambulatory Visit: Payer: Self-pay

## 2020-03-08 DIAGNOSIS — C787 Secondary malignant neoplasm of liver and intrahepatic bile duct: Secondary | ICD-10-CM

## 2020-03-11 NOTE — Progress Notes (Signed)
[place]   [phone] [phone]   Clinic Follow up Note   Patient Care Team: [name], MD as PCP - General (Family Medicine) [name], MD as Consulting Physician (Otolaryngology) [name], MD as Consulting Physician (Oncology)   I connected with Morgan Dawson on 03/15/2020 at  3:20 PM EDT by video enabled telemedicine visit and verified that I am speaking with the correct person using two identifiers.  I discussed the limitations, risks, security and privacy concerns of performing an evaluation and management service by telephone and the availability of in person appointments. I also discussed with the patient that there may be a patient responsible charge related to this service. The patient expressed understanding and agreed to proceed.   Other persons participating in the visit and their role in the encounter:  Her husband   Patient's location:  Her home  Provider's location:  My Office   CHIEF COMPLAINT: F/u of pancreatic cancer  SUMMARY OF ONCOLOGIC HISTORY: Oncology History Overview Note  Cancer Staging Pancreatic cancer [facility]) Staging form: Exocrine Pancreas, AJCC 8th Edition - Clinical stage from 08/06/2017: Stage IV (cTX, cN0, pM1) - Signed by [name], MD on 08/12/2017     Pancreatic cancer ([place])  07/23/2017 Imaging   US abdomen limited RUQ 07/23/17 IMPRESSION: 1. Cholelithiasis.  No secondary signs of acute cholecystitis. 2. Multiple solid liver masses measuring up to 6.5 cm in the left lobe of the liver, evaluation for metastatic disease is recommended. These results will be called to the ordering clinician or representative by the Radiologist Assistant, and communication documented in the PACS or zVision Dashboard.   07/23/2017 Imaging   CT Abdomen W Contrast 07/23/17 IMPRESSION: 1. Widespread metastatic disease throughout the liver. No clear primary malignancy identified in the abdomen. The pelvis was  not imaged. Tissue sampling recommended. 2. Probable adenopathy superior to the pancreatic tail. No evidence of pancreatic mass. 3. Suspected incidental hemangioma inferiorly in the right hepatic lobe. 4. Nonspecific nodularity in the breasts. The patient has undergone recent (03/25/2017 and 04/01/2017) mammography and ultrasound.   07/29/2017 Initial Diagnosis   Metastasis to liver of unknown origin ([place])   07/29/2017 PET scan   PET 07/29/17  IMPRESSION: 1. Numerous bulky liver masses are hypermetabolic compatible with malignancy. 2. Accentuated activity within or along the pancreatic tail, likely represent a primary pancreatic tumor. Consider pancreatic protocol MRI to further work this up. 3. The peripancreatic lymph node shown above the pancreatic tail is mildly hypermetabolic favoring malignancy. 4.  Prominent stool throughout the colon favors constipation. 5. 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   08/05/2017 Pathology Results   Liver Biopsy  Diagnosis 08/05/17 Liver, needle/core biopsy - CARCINOMA. - SEE COMMENT. Microscopic Comment The malignant cells are positive for cytokeratin 7. They are negative for arginase, CDX2, cytokeratin 20, estrogen receptor, GATA-3, GCDFP, Glypican 3, Hep Par 1, Napsin A, and TTF-1. This immunohistochemical is nonspecific. Possibly primary sources include pancreatobiliary and upper gastrointestinal. Radiologic correlation is necessary. Of note, organ specific markers (GATA-3, GCDFP-breast, TTF-1, Napsin A-lung, and CDX2-colon) are negative. (JBK:ecj 08/09/2017)   08/21/2017 - 02/10/2018 Chemotherapy   FOLFIRINOX every 2 weeks starting 08/21/17. Dose reduced due to neuropahty and cytopenia    10/14/2017 Imaging   CT CAP WO Contrast 10/14/17 IMPRESSION: Evidence of known numerous liver metastases without significant interval change. No other evidence of metastatic disease within the chest, abdomen or pelvis. Cholelithiasis. Tiny  pericardial effusion.   12/02/2017 Genetic Testing   Negative for  pathogenic mutation.  The genes analyzed were the 83 genes on [facility]'s Multi-Cancer panel (ALK, APC, ATM, AXIN2, BAP1, BARD1, BLM, BMPR1A, BRCA1, BRCA2, BRIP1, CASR, CDC73, CDH1, CDK4, CDKN1B, CDKN1C, CDKN2A, CEBPA, CHEK2, CTNNA1, DICER1, DIS3L2, EGFR, EPCAM, FH, FLCN, GATA2, GPC3, GREM1, HOXB13, HRAS, KIT, MAX, MEN1, MET, MITF, MLH1, MSH2, MSH3, MSH6, MUTYH, NBN, NF1, NF2, NTHL1, PALB2, PDGFRA, PHOX2B, PMS2, POLD1, POLE, POT1, PRKAR1A, PTCH1, PTEN, RAD50, RAD51C, RAD51D, RB1, RECQL4, RET, RUNX1, SDHA, SDHAF2, SDHB, SDHC, SDHD, SMAD4, SMARCA4, SMARCB1, SMARCE1, STK11, SUFU, TERC, TERT, TMEM127, TP53, TSC1, TSC2, VHL, WRN, WT1).   12/17/2017 PET scan   IMPRESSION: 1. Although the liver metastases are still visible on the CT images, they are significantly smaller and retain no abnormal metabolic activity, consistent with response to therapy. 2. No abnormal activity within the pancreas. 3. No disease progression identified. 4. Cholelithiasis.   02/21/2018 PET scan   02/21/2018 PET Scan  IMPRESSION: 1. There are 2 new small nodules identified within the right lung which measure up to 5 mm. These are too small to reliably characterize by PET-CT, but warrant close interval follow-up. 2. Again noted are multifocal liver metastasis. The target lesion within the left lobe is slightly decreased in size when compared with the previous exam. Similar to previous exam there is no abnormal hypermetabolism above background liver activity identified within the liver lesions. 3. Gallstones.   02/23/2018 - 09/04/2018 Chemotherapy   5-FU pump infusion and liposomal irinotecan, every 2 weeks.  First cycle dose reduced due to cytopenia in the travel. Stopped on 08/25/2018 due to disease progression.    05/17/2018 Imaging   05/17/2018 PET Scan IMPRESSION: 1. Mixed response. The 2 small right lung nodules have decreased in size in the interval. Single  focus of increased uptake within the liver is new from 02/21/2018 and concerning for recurrent metabolically active liver metastasis. 2. Splenomegaly.  New from previous exam. 3. Diffusely increased bone marrow activity, likely reflecting treatment related changes.   09/01/2018 PET scan   PET 09/01/18 IMPRESSION: 1. Unfortunately there recurrence of hepatic metastasis with multiple new hypermetabolic lesions in LEFT and RIGHT hepatic lobe. 2. New extra hepatic site of malignancy which appears associated the mid pancreas. Difficult to define lesion on noncontrast exam. 3. Diffuse marrow activity is favored benign. 4. No evidence of pulmonary metastasis.   09/12/2018 - 03/27/2019 Chemotherapy   second-line Gemcitabine and Abraxane for 2 weeks on, 1 week off starting 09/12/2018. Due to neuropathy, I will start her with dose reduced Abraxane. Stopped after 03/27/19 due to disease progression.    12/14/2018 Imaging   CT CAP  IMPRESSION: CT demonstrates acute cholecystitis, with choledocholithiasis of the cystic duct.  These results were discussed by telephone at the time of interpretation on 12/14/2018 at 5:05 pm with Dr. Burr Medico.  Redemonstration of known liver metastases, with positive response to therapy, interval reduction in size of all lesions, with multiple demonstrating significant internal necrosis.  No acute finding of the chest.  Ancillary findings as above.   03/21/2019 Imaging   CT CAP W Contrast IMPRESSION: 1. Numerous rim enhancing liver metastases show overall trend towards mild progression although 1 of the index lesions is stable in the interval. 2. Interval development of portocaval lymphadenopathy concerning for disease progression. 3. Slight progression of main duct dilatation in the pancreas with abrupt cut off in the region of the body. 4. Interval development of a 9 mm subpleural nodule at the right base along the posterior hemidiaphragm. Close attention on  follow-up recommended  as metastatic disease not excluded. 5. New splenomegaly. 6. Cholelithiasis with diffuse gallbladder wall thickening. Gallbladder wall thickening is decreased in the interval. Stones and sludge noted in the gallbladder lumen.   04/09/2019 Imaging   CT AP W Contrast 04/09/19  IMPRESSION: 1. Multiple gallstones in lumen gallbladder without evidence acute cholecystitis. No significant change from prior. No biliary duct dilatation. Common bile duct normal. 2. Widespread hepatic metastasis unchanged from comparison CT. 3. Chronic pancreatic duct dilatation in the body and tail unchanged. 4. No bowel obstruction. 5. Small amount of intraperitoneal free fluid unchanged.   04/10/2019 - 03/15/2020 Chemotherapy   Third-line GTX with Xeloda 1012m BID day 1-14, off for 1 week with Docetaxel 323mm2 and Gemcitabine 75070m2 on day 4 and day 11 every 3 weeks starting oral chemo on 04/10/19. Starting 07/06/19 she will reduce Xeloda to 1000m24m the AM and 500mg34mthe PM due to low blood counts. D/c 03/15/20 due to disease progression in liver. Last dose taken 03/09/20.   07/03/2019 Imaging   CT CAP W Contrast  IMPRESSION: 1. Overall stable diffuse hepatic metastatic disease. No new/progressive findings. 2. Stable to slightly larger periportal lymph node. 3. Stable CT appearance of the pancreas. Abrupt cut off of a dilated main pancreatic duct in the body/head junction region without obvious pancreatic lesion. 4. No pulmonary nodules are identified at the lung bases. 5. Stable mild splenomegaly. 6. No new/acute findings.     10/17/2019 Imaging   CT CAP W Contrast  IMPRESSION: 1. Overall mild progression of metastatic disease. Portacaval lymphadenopathy has increased. Enlarging lesion along the posterior right diaphragm is suspicious for growing right pleural metastasis. 2. Bulky liver metastatic disease is relatively stable with mild mixed changes as detailed. 3. Pancreatic  body tumor is stable. 4. Moderate splenomegaly, increased. 5. Aortic Atherosclerosis (ICD10-I70.0). Additional chronic findings as detailed.       01/09/2020 Imaging   CT CHEST/ABD/PEL IMPRESSION 1. No specific evidence of metastatic disease within the chest. 2. New small right pleural effusion. 1. Relatively similar size of pancreatic primary. 2. Although direct comparison of liver metastasis is challenging secondary to differences in bolus timing, mild progression is identified. 3. Mild progression of abdominal adenopathy. 4. Persistent hepatosplenomegaly. 5. Trace perihepatic ascites, similar. 6. Borderline small bowel dilatation, without focal transition point. Ileus versus low-grade partial small bowel obstruction. 7. Cholelithiasis.   01/21/2020 Imaging   CT AP  IMPRESSION: 1. No significant change of pancreatic fullness/mass, innumerable hepatic masses, abdominal lymphadenopathy and small amount of ascites compatible with metastatic disease. 2. Unchanged splenomegaly. 3. Decreased small RIGHT pleural effusion. Bibasilar atelectasis again noted. 4. Aortic Atherosclerosis (ICD10-I70.0).     03/13/2020 Imaging   CT CAP W contrast  IMPRESSION: 1. Similar size of pancreatic head/neck mass. 2. Mild interval increase in size of some of the hepatic metastasis. 3. Prominent loops of small bowel within the central and lower abdomen without discrete transition point identified. Findings may represent ileus. Additionally, there is mild wall thickening of a proximal loop of small bowel which is nonspecific. Findings may represent enteritis. 4. Wall thickening of the ascending colon and proximal transverse colon as well as descending colon and rectum raising the possibility of colitis. 5. Small volume perihepatic ascites. 6. Mildly progressed abdominal adenopathy. 7. Possible 11 mm mass within the right breast. Recommend correlation with mammography if clinically indicated  given clinical condition. 8. Aortic atherosclerosis. 9. These results will be called to the ordering clinician or representative by the Radiologist Assistant, and communication  documented in the PACS or [employer].     Pancreatic cancer metastasized to liver ([place])  08/11/2017 Initial Diagnosis   Pancreatic cancer metastasized to liver [facility])      CURRENT THERAPY:  Third-line GTX with Xeloda1086mBID day 1-14, off for 1 week with Docetaxel332mm2and Gemcitabine7505m2on day 4 and day 11 every 3 weeks starting oral chemo on 04/10/19.Starting 07/06/19 she will reduce Xeloda to 1000m67m the AM and 500mg80mthe PM due to low blood counts.D/c 03/15/20 due to disease progression in liver.    INTERVAL HISTORY:  Morgan SWOFFORDere for a follow up. They identified themselves by face to face video. She notes she is doing well. She notes she has had diarrhea the last 6 days. Imodium has not helped consistently. Last BM was yesterday. She notes her last Xeloda was taken 6 days ago. She remains to have most of her side effects on her days off Xeloda. She denies blood in stool or cramping. She does note more abdominal bloating and early satiety.  She notes she has not been monitoring her weight at home. She notes right after IV treatment she still has fever on Day 4-6. She notes her energy level is decreased and sitting more then 50% of the day. She was able to walk for 45 minutes in [place] did stop often due to neuropathy in her feet. She also notes having watery eyes which is very annoying to her. She note for her mucositis she uses non-alcohol mouthwash.    REVIEW OF SYSTEMS:   Constitutional: Denies fevers, chills or abnormal weight loss Eyes: Denies blurriness of vision (+) Watery eyes  Ears, nose, mouth, throat, and face: Denies mucositis or sore throat Respiratory: Denies cough, dyspnea or wheezes Cardiovascular: Denies palpitation, chest discomfort or lower extremity  swelling Gastrointestinal:  Denies nausea, heartburn (+) Diarrhea (+) abdominal bloating.  Skin: Denies abnormal skin rashes Lymphatics: Denies new lymphadenopathy or easy bruising Neurological: (+) Neuropathy in feet Behavioral/Psych: Mood is stable, no new changes  All other systems were reviewed with the patient and are negative.  MEDICAL HISTORY:  Past Medical History:  Diagnosis Date  . Diarrhea of presumed infectious origin 01/12/2018  . Gastroenteritis 01/12/2018  . Genetic testing 12/02/2017   Multi-Cancer panel (83 genes) @ [facility] - No pathogenic mutations detected  . Meniere disease 2016  . met pancreatic ca to liver dx'd 07/2017  . Pancreatitis     SURGICAL HISTORY: Past Surgical History:  Procedure Laterality Date  . CERVICAL ABLATION    . ENDOMETRIAL ABLATION  2011  . IR CHOLANGIOGRAM EXISTING TUBE  01/09/2019  . IR FLUORO GUIDE PORT INSERTION RIGHT  08/12/2017  . IR PERC CHOLECYSTOSTOMY  12/15/2018  . IR US [place] VASC ACCESS RIGHT  08/12/2017  . MANDIAmherst have reviewed the social history and family history with the patient and they are unchanged from previous note.  ALLERGIES:  has No Known Allergies.  MEDICATIONS:  Current Outpatient Medications  Medication Sig Dispense Refill  . b complex vitamins tablet Take 1 tablet by mouth daily.    . capecitabine (XELODA) 500 MG tablet TAKE 2 TABLETS BY MOUTH IN THE MORNING AND 1 TABLET IN THE EVENING, EVERY 12 HOURS, WITHIN 30 MINUTES OF MEALS ON DAYS 1 TO 14 OF EACH 21 DAY CYCLE (Patient taking differently: 500-1,000 mg See admin instructions. Takes 2 tablets by mouth in the morning and 1 tablet in the ebvening, every 12 hours, within  30 minutes of meals on days 1 to 14 of each 21 day cycle.) 42 tablet 3  . Cholecalciferol (VITAMIN D3 GUMMIES ADULT) 25 MCG (1000 UT) CHEW Chew 2,000 Units by mouth daily.     . cyclobenzaprine (FLEXERIL) 5 MG tablet Take 1 tablet (5 mg total) by mouth 3 (three) times daily  as needed for muscle spasms. 30 tablet 0  . gabapentin (NEURONTIN) 100 MG capsule Take 1 capsule (100 mg total) by mouth 3 (three) times daily. (Patient taking differently: Take 100 mg by mouth in the morning and at bedtime. ) 270 capsule 2  . lidocaine-prilocaine (EMLA) cream Apply 1 application topically as needed. 30 g 2  . loperamide (IMODIUM) 2 MG capsule Take 1 capsule (2 mg total) by mouth as needed for diarrhea or loose stools. 30 capsule 0  . loratadine (CLARITIN) 10 MG tablet Take 10 mg by mouth daily as needed for allergies.    . magic mouthwash w/lidocaine SOLN Take 10 mLs by mouth 3 (three) times daily as needed for mouth pain. Swish and spit 10 ML by mouth 3 times daily as needed for mouth pain. 240 mL 1  . meclizine (ANTIVERT) 25 MG tablet Take 25 mg by mouth 3 (three) times daily as needed for dizziness.    . ondansetron (ZOFRAN-ODT) 8 MG disintegrating tablet TAKE 1 TABLET BY MOUTH EVERY 8 HOURS AS NEEDED FOR NAUSE OR VOMITING (Patient taking differently: Take 8 mg by mouth every 8 (eight) hours as needed for nausea or vomiting. ) 40 tablet 1  . potassium chloride (KLOR-CON) 10 MEQ tablet Take 1 tablet (10 mEq total) by mouth 4 (four) times daily. 360 tablet 2  . prochlorperazine (COMPAZINE) 10 MG tablet Take 1 tablet (10 mg total) by mouth every 6 (six) hours as needed for nausea or vomiting. (Patient not taking: Reported on 01/21/2020) 40 tablet 2  . PROMACTA 50 MG tablet TAKE 1 TABLET BY MOUTH 1 TIME A DAY. (Patient taking differently: Take 50 mg by mouth daily. ) 30 tablet 1  . Rivaroxaban (XARELTO) 15 MG TABS tablet TAKE 1 TABLET(15 MG) BY MOUTH DAILY (Patient taking differently: Take 15 mg by mouth daily. ) 90 tablet 1  . traMADol (ULTRAM) 50 MG tablet Take 1 tablet (50 mg total) by mouth every 6 (six) hours as needed for moderate pain or severe pain. 30 tablet 0   No current facility-administered medications for this visit.   Facility-Administered Medications Ordered in Other  Visits  Medication Dose Route Frequency Provider Last Rate Last Admin  . sodium chloride flush (NS) 0.9 % injection 10 mL  10 mL Intracatheter Once [name], MD        PHYSICAL EXAMINATION: ECOG PERFORMANCE STATUS: 2 - Symptomatic, <50% confined to bed  No vitals taken today, Exam not performed today   LABORATORY DATA:  I have reviewed the data as listed CBC Latest Ref Rng & Units 03/06/2020 02/28/2020 02/15/2020  WBC 4.0 - 10.5 K/uL 3.3(L) 6.0 2.7(L)  Hemoglobin 12.0 - 15.0 g/dL 8.1(L) 8.8(L) 8.9(L)  Hematocrit 36 - 46 % 25.0(L) 27.6(L) 28.2(L)  Platelets 150 - 400 K/uL 248 358 315     CMP Latest Ref Rng & Units 03/06/2020 02/28/2020 02/15/2020  Glucose 70 - 99 mg/dL 138(H) 108(H) 137(H)  BUN 6 - 20 mg/dL 14 14 15   Creatinine 0.44 - 1.00 mg/dL 0.84 0.93 1.00  Sodium 135 - 145 mmol/L 140 140 138  Potassium 3.5 - 5.1 mmol/L 3.5 3.5 3.6  Chloride  98 - 111 mmol/L 105 104 103  CO2 22 - 32 mmol/L 26 25 26   Calcium 8.9 - 10.3 mg/dL 9.8 9.2 9.8  Total Protein 6.5 - 8.1 g/dL 6.8 6.9 7.2  Total Bilirubin 0.3 - 1.2 mg/dL 0.8 1.1 0.9  Alkaline Phos 38 - 126 U/L 244(H) 250(H) 275(H)  AST 15 - 41 U/L 52(H) 54(H) 52(H)  ALT 0 - 44 U/L 23 23 32      RADIOGRAPHIC STUDIES: I have personally reviewed the radiological images as listed and agreed with the findings in the report. No results found.   ASSESSMENT & PLAN:  SIANNI CLONINGER is a 59 y.o. female with    1. 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 She progressed on this based on 08/2018 PET scan. -She mildly progressed onsecond-line Gemcitabine and Abraxanein liver and new lung nodule.  -She is currently onthird-line GTX with Xeloda 1059m BID 2 weeks on/1 week off andDocetaxelandGemcitabineon day 4 and 11 q3weeks starting  04/10/19.she required dose adjustment and GCSF -[facility] -I personally reviewed and discussed her CT CAP from 03/13/20 with pt and her husband through [facility], which shows stable pancreatic head mass and interval increase in size of liver mets. Overall she has disease progression, will d/c GTX.  -Her CT scan showed incidental findings which I reviewed with her including 1.1cm mass in her right breast. She notes h/o multiple benign cyst in the last 15 years. Given CT is not the best imaging tool, will monitor with exam and Mammogram moving forward.  -Her labs were stable from last week.  -[facility], and [facility] open to her. She will think about it and open to discussing it with them further. [facility] -I also [facility] to get more info for her -I discussed if she does not proceed with clinical trail, I have no more standard chemo regimen available.  -I discussed her possible prognosis without any further treatment.  -F/u in 2-3 weeks  -I encouraged her to stay active and eat well   2.Abdominal pain -Pain is not often. She rarely uses Tramadol.   3.Malnutrition, Diarrhea, Bloating  -Secondary to chemo and underlying metastatic cancer. -Continue nutritional supplement and f/u with dietician. -Appetite and eatinghas been fair but she has been able to maintain weight well. -Her diarrhea and bloating is secondary to GTX but occurs on her week off treatment. I discussed using imodium, but not too much as this can lead to constipation.   4. Transaminitis -Due to underlying chemo and malignancy -Has improved and fluctuates.   5. Pancytopenia, secondary to chemo -Required chemo dose reductions. -S/p blood transfusion as  needed withHg<8. Last on 09/20/18 -OnPromacta 57mdaily.She is responding well to Granix.  -She only has moderate anemia currently and intermittent Leukopenia.   6. Hypokalemia, CKD Stage III -Sheis currently on KCL1094mTID.  -Hypokalemia and Cr have resolved.   7.Peripheral neuropathy, grade 2  -Currently on Neurontin 200 mg at night and Bcomplex.She used ice bags during infusion. -Has progressed with more treatment. This is more present in  her feet than her hands.  -Will further reduce dose Docetaxel dose starting with C16.  -Given disease progression will d/c GTX.   8.H/o Acute cholecystitis, status post cholecystectomy tube placement 4/23/20per IR -She was seen by surgery during herhospitalization, surgery was not recommended due to high risk of complication, and concerns for cancer progression when holding chemotherapy for surgery -Her CT CAP from 12/14/18 shows she has numerus gallstones. Imaging in 12/2018 consistent with cholecystitis with cystic duct obstruction. No leak on HIDA. -Noevidence of recurrent infectionrecently.  9. LE DVT -Diagnosed on December 15, 2018, when she was hospitalized for cholecystitis.  -On low-doseXarelto35m daily.   10.Goal of care discussion, DNR/DNI -The patient understands the goal of care is palliative. -I previously recommended DNR/DNI, she agreed. She has living will set up.   Plan -CT CAP reviewed, showed disease progression in liver. Will d/c GTX.  -[facility]  -Lab and f/u in 2-3 weeks    No problem-specific Assessment & Plan notes found for this encounter.   No orders of the defined types were placed in this encounter.  I discussed the assessment and treatment plan with the patient. The patient was provided an opportunity to ask questions and all were answered. The patient agreed with the plan and demonstrated an understanding of the instructions.  The patient was advised  to call back or seek an in-person evaluation if the symptoms worsen or if the condition fails to improve as anticipated.  The total time spent in the appointment was 40 minutes.    YTruitt Merle MD 03/15/2020   I, AJoslyn Devon am acting as scribe for YTruitt Merle MD.   I have reviewed the above documentation for accuracy and completeness, and I agree with the above.

## 2020-03-13 ENCOUNTER — Other Ambulatory Visit: Payer: Self-pay

## 2020-03-13 ENCOUNTER — Ambulatory Visit (HOSPITAL_COMMUNITY)
Admission: RE | Admit: 2020-03-13 | Discharge: 2020-03-13 | Disposition: A | Discharge status: Home / Self Care | Payer: BLUE CROSS/BLUE SHIELD | Source: Ambulatory Visit | Attending: Hematology | Admitting: Hematology

## 2020-03-13 DIAGNOSIS — C259 Malignant neoplasm of pancreas, unspecified: Secondary | ICD-10-CM

## 2020-03-13 DIAGNOSIS — C787 Secondary malignant neoplasm of liver and intrahepatic bile duct: Secondary | ICD-10-CM | POA: Diagnosis present

## 2020-03-13 MED ORDER — SODIUM CHLORIDE (PF) 0.9 % IJ SOLN
INTRAMUSCULAR | Status: AC
  Filled 2020-03-13: qty 50

## 2020-03-13 MED ORDER — IOHEXOL 300 MG/ML  SOLN
100.0000 mL | Freq: Once | INTRAMUSCULAR | Status: AC | PRN
  Administered 2020-03-13: 100 mL via INTRAVENOUS

## 2020-03-13 NOTE — Progress Notes (Signed)
Error

## 2020-03-14 ENCOUNTER — Telehealth: Payer: Self-pay | Admitting: Hematology

## 2020-03-14 NOTE — Telephone Encounter (Signed)
Contacted patient to verify mychart video visit for pre reg 

## 2020-03-15 ENCOUNTER — Telehealth (HOSPITAL_BASED_OUTPATIENT_CLINIC_OR_DEPARTMENT_OTHER): Payer: BLUE CROSS/BLUE SHIELD | Admitting: Hematology

## 2020-03-15 DIAGNOSIS — C259 Malignant neoplasm of pancreas, unspecified: Secondary | ICD-10-CM | POA: Diagnosis not present

## 2020-03-15 DIAGNOSIS — C251 Malignant neoplasm of body of pancreas: Secondary | ICD-10-CM | POA: Diagnosis not present

## 2020-03-16 ENCOUNTER — Encounter: Payer: Self-pay | Admitting: Hematology

## 2020-03-19 ENCOUNTER — Telehealth: Payer: Self-pay | Admitting: Hematology

## 2020-03-19 ENCOUNTER — Telehealth: Payer: Self-pay

## 2020-03-19 MED FILL — Dexamethasone Sodium Phosphate Inj 100 MG/10ML: INTRAMUSCULAR | Qty: 1 | Status: AC

## 2020-03-19 NOTE — Telephone Encounter (Signed)
Scheduled appt per 7/24 sch msg - pt is aware of appt date and time

## 2020-03-19 NOTE — Telephone Encounter (Signed)
-----   Message from [name], MD sent at 03/18/2020  8:54 PM EDT ----- Lovena Le,  Could you fax her recent lab, CT scan report and my last office note to her oncologist Dr. Marisa Cyphers at [facility]? His fax number is [phone].   Please let pt know that I have contacted Dr. Hillard Danker and faxed her recent records over, she should get a call from them soon.   Thanks  Krista Blue

## 2020-03-19 NOTE — Telephone Encounter (Signed)
Recent office note, lab and CT scan report faxed to Dr. Hillard Danker office. Called and notified Morgan Dawson that records have been faxed and Dr. Hillard Danker office should be contacting her soon. Morgan Dawson verbalized understanding.

## 2020-03-20 ENCOUNTER — Inpatient Hospital Stay: Payer: BLUE CROSS/BLUE SHIELD

## 2020-03-20 ENCOUNTER — Other Ambulatory Visit: Payer: BLUE CROSS/BLUE SHIELD

## 2020-03-20 ENCOUNTER — Ambulatory Visit: Payer: BLUE CROSS/BLUE SHIELD

## 2020-03-21 ENCOUNTER — Encounter: Payer: Self-pay | Admitting: Hematology

## 2020-03-27 ENCOUNTER — Telehealth: Payer: Self-pay | Admitting: Emergency Medicine

## 2020-03-27 ENCOUNTER — Other Ambulatory Visit: Payer: BLUE CROSS/BLUE SHIELD

## 2020-03-27 ENCOUNTER — Ambulatory Visit: Payer: BLUE CROSS/BLUE SHIELD

## 2020-03-27 NOTE — Telephone Encounter (Signed)
Returning pt's call requesting [facility] appt today or tomorrow morning for abdominal pain (mostly under L side ribcage) starting a few days ago.  Pt denies N/V/D or fever/chills, reports only constipation and pain with deep breathing/movement.  Pt states she feels ok to come tomorrow morning instead of today, aware to f/u as needed beforehand if symptoms worsen.  High priority scheduling message sent for [facility] appt on 03/28/20 for 0900, pt aware.  No labs/imaging at this time per PA who will assess pt first.

## 2020-03-28 ENCOUNTER — Other Ambulatory Visit: Payer: Self-pay

## 2020-03-28 ENCOUNTER — Inpatient Hospital Stay: Payer: BLUE CROSS/BLUE SHIELD | Attending: Hematology | Admitting: Medical

## 2020-03-28 ENCOUNTER — Ambulatory Visit (HOSPITAL_COMMUNITY)
Admission: RE | Admit: 2020-03-28 | Discharge: 2020-03-28 | Disposition: A | Discharge status: Home / Self Care | Payer: BLUE CROSS/BLUE SHIELD | Source: Ambulatory Visit | Attending: Medical | Admitting: Medical

## 2020-03-28 VITALS — BP 104/73 | HR 96 | Temp 98.2°F | Resp 17 | Ht 65.0 in | Wt 101.3 lb

## 2020-03-28 DIAGNOSIS — R6881 Early satiety: Secondary | ICD-10-CM | POA: Diagnosis not present

## 2020-03-28 DIAGNOSIS — G620 Drug-induced polyneuropathy: Secondary | ICD-10-CM | POA: Insufficient documentation

## 2020-03-28 DIAGNOSIS — Z9049 Acquired absence of other specified parts of digestive tract: Secondary | ICD-10-CM | POA: Diagnosis not present

## 2020-03-28 DIAGNOSIS — R1084 Generalized abdominal pain: Secondary | ICD-10-CM | POA: Insufficient documentation

## 2020-03-28 DIAGNOSIS — Z807 Family history of other malignant neoplasms of lymphoid, hematopoietic and related tissues: Secondary | ICD-10-CM | POA: Diagnosis not present

## 2020-03-28 DIAGNOSIS — E46 Unspecified protein-calorie malnutrition: Secondary | ICD-10-CM | POA: Diagnosis not present

## 2020-03-28 DIAGNOSIS — R197 Diarrhea, unspecified: Secondary | ICD-10-CM | POA: Insufficient documentation

## 2020-03-28 DIAGNOSIS — T451X5A Adverse effect of antineoplastic and immunosuppressive drugs, initial encounter: Secondary | ICD-10-CM | POA: Insufficient documentation

## 2020-03-28 DIAGNOSIS — R140 Abdominal distension (gaseous): Secondary | ICD-10-CM | POA: Insufficient documentation

## 2020-03-28 DIAGNOSIS — Z800 Family history of malignant neoplasm of digestive organs: Secondary | ICD-10-CM | POA: Diagnosis not present

## 2020-03-28 DIAGNOSIS — C259 Malignant neoplasm of pancreas, unspecified: Secondary | ICD-10-CM | POA: Diagnosis not present

## 2020-03-28 DIAGNOSIS — Z86718 Personal history of other venous thrombosis and embolism: Secondary | ICD-10-CM | POA: Insufficient documentation

## 2020-03-28 DIAGNOSIS — C787 Secondary malignant neoplasm of liver and intrahepatic bile duct: Secondary | ICD-10-CM | POA: Insufficient documentation

## 2020-03-28 DIAGNOSIS — H8109 Meniere's disease, unspecified ear: Secondary | ICD-10-CM | POA: Insufficient documentation

## 2020-03-28 DIAGNOSIS — Z808 Family history of malignant neoplasm of other organs or systems: Secondary | ICD-10-CM | POA: Insufficient documentation

## 2020-03-28 DIAGNOSIS — K5900 Constipation, unspecified: Secondary | ICD-10-CM | POA: Diagnosis not present

## 2020-03-28 DIAGNOSIS — Z803 Family history of malignant neoplasm of breast: Secondary | ICD-10-CM | POA: Insufficient documentation

## 2020-03-28 DIAGNOSIS — K649 Unspecified hemorrhoids: Secondary | ICD-10-CM | POA: Insufficient documentation

## 2020-03-28 DIAGNOSIS — Z7901 Long term (current) use of anticoagulants: Secondary | ICD-10-CM | POA: Diagnosis not present

## 2020-03-28 DIAGNOSIS — R1012 Left upper quadrant pain: Secondary | ICD-10-CM | POA: Diagnosis not present

## 2020-03-28 NOTE — Progress Notes (Signed)
These results were called to [name] and were reviewed with her. She expressed understanding.

## 2020-03-28 NOTE — Progress Notes (Signed)
[facility] Progress Note   Morgan Dawson [id number] February 01, 1961 59 y.o.  Morgan Dawson is managed by Dr. [name]  Actively treated with chemotherapy/immunotherapy/hormonal therapy: no  Next scheduled appointment with provider: 04/05/2020  Assessment: Plan:    Generalized abdominal pain - Plan: DG Abd 1 View  Pancreatic cancer metastasized to liver [facility])  Malignant neoplasm of pancreas, unspecified location of malignancy ([place])   Abdominal pain: The patient was referred for an x-ray of her abdomen which returned showing:  FINDINGS: Soft tissues of the abdomen are unremarkable.  Stool noted throughout the colon.  No bowel distention or free air.  Punctate calcific densities noted left upper quadrant, possibly pill fragments.  Mild lumbar spine scoliosis concave right.  Degenerative changes lumbar spine and both hips.  IMPRESSION: No acute abnormality identified.  No bowel distention or free air. Stool noted throughout the colon.  Constipation could present in this fashion.  The surgery reviewed with the patient.  She was told to begin using stool softeners and MiraLAX.   Metastatic pancreatic cancer: The patient is elected to forego any additional chemotherapy at this time.  She is scheduled to be seen in follow-up on 04/05/2020.   Please see After Visit Summary for patient specific instructions.  Future Appointments  Date Time Provider [place]  04/05/2020 12:30 PM [facility] 6 [facility] None  04/05/2020 12:45 PM [facility] [place] None  04/05/2020  1:20 PM [name], MD [facility] None    Orders Placed This Encounter  Procedures  . DG Abd 1 View       Subjective:   Patient ID:  Morgan Dawson is a 59 y.o. (DOB 03/23/1961) female.  Chief Complaint: No chief complaint on file.   HPI Morgan Dawson is a 59 y.o. female with a diagnosis of a metastatic pancreatic cancer.  She has been followed by Dr. [name].  She has elected to forego any additional chemotherapy at this point.  She presents to the clinic today reporting that she developed abdominal pain on Tuesday evening and noticed an increase in abdominal pain on Wednesday morning.  She states that her pain was across her epigastrium but then localized over her left upper quadrant.  She noted initially that her pain increased with deep breathing, coughing, or pressing on her abdomen.  She also reports noting a hard nodule in her epigastrium which is since disappeared.  She took tramadol on Wednesday morning without relief.  She is feeling better today.  She reports that she has not had a bowel movement for 3 days.  Last week she had an episode of explosive diarrhea for which she took Imodium.  She states that the next day she had loose stools and again took Imodium.  This was on Wednesday and Thursday.  Friday Saturday and Sunday she had a normal bowel movement.  She reports having episodic nausea in the morning.  She has been taking Zofran in the afternoon as she has been wanting to avoid any nausea at night as she and her husband have been eating more with friends and others.  She took Zofran last evening.  She has not vomited since 03/22/2020 which she did at lunchtime.  She continues to have flatus.  Medications: I have reviewed the patient's current medications.  Allergies: No Known Allergies  Past Medical History:  Diagnosis Date  . Diarrhea of presumed infectious origin 01/12/2018  . Gastroenteritis 01/12/2018  . Genetic testing 12/02/2017  Multi-Cancer panel (83 genes) @ [facility] - No pathogenic mutations detected  . Meniere disease 2016  . met pancreatic ca to liver dx'd 07/2017  . Pancreatitis     Past Surgical History:  Procedure Laterality Date  . CERVICAL ABLATION    . ENDOMETRIAL ABLATION  2011  . IR CHOLANGIOGRAM EXISTING TUBE  01/09/2019  . IR FLUORO GUIDE PORT INSERTION RIGHT  08/12/2017  . IR PERC CHOLECYSTOSTOMY  12/15/2018   . IR US GUIDE VASC ACCESS RIGHT  08/12/2017  . MANDIBLE SURGERY  1999    Family History  Problem Relation Age of Onset  . Colon cancer Father 40       currently 32  . Colon cancer Maternal Grandfather   . Breast cancer Cousin   . Thyroid cancer Mother 42       facial radiation for acne as teen  . Lymphoma Paternal Aunt 49       deceased 47  . Breast cancer Paternal Aunt     Social History   Socioeconomic History  . Marital status: Married    Spouse name: Not on file  . Number of children: Not on file  . Years of education: Not on file  . Highest education level: Not on file  Occupational History  . Not on file  Tobacco Use  . Smoking status: Never Smoker  . Smokeless tobacco: Never Used  Vaping Use  . Vaping Use: Never used  Substance and Sexual Activity  . Alcohol use: No  . Drug use: No  . Sexual activity: Not Currently  Other Topics Concern  . Not on file  Social History Narrative  . Not on file   Social Determinants of Health   Financial Resource Strain:   . Difficulty of Paying Living Expenses:   Food Insecurity:   . Worried About Charity fundraiser in the Last Year:   . Arboriculturist in the Last Year:   Transportation Needs:   . Film/video editor (Medical):   Marland Kitchen Lack of Transportation (Non-Medical):   Physical Activity:   . Days of Exercise per Week:   . Minutes of Exercise per Session:   Stress:   . Feeling of Stress :   Social Connections:   . Frequency of Communication with Friends and Family:   . Frequency of Social Gatherings with Friends and Family:   . Attends Religious Services:   . Active Member of Clubs or Organizations:   . Attends Archivist Meetings:   Marland Kitchen Marital Status:   Intimate Partner Violence:   . Fear of Current or Ex-Partner:   . Emotionally Abused:   Marland Kitchen Physically Abused:   . Sexually Abused:     Past Medical History, Surgical history, Social history, and Family history were reviewed and updated as  appropriate.   Please see review of systems for further details on the patient's review from today.   Review of Systems:  Review of Systems  Constitutional: Negative for activity change, appetite change, chills, diaphoresis and fever.  HENT: Negative for trouble swallowing.   Respiratory: Negative for cough, chest tightness and shortness of breath.   Cardiovascular: Negative for chest pain, palpitations and leg swelling.  Gastrointestinal: Positive for abdominal distention, abdominal pain, constipation and nausea. Negative for diarrhea and vomiting.    Objective:   Physical Exam:  BP 104/73 (BP Location: Left Arm, Patient Position: Sitting)   Pulse 96   Temp 98.2 F (36.8 C) (Temporal)   Resp 17  Ht _0  (1.651 m)   Wt 101 lb 4.8 oz (45.9 kg)   LMP 10/23/2015   SpO2 100%   BMI 16.86 kg/m  ECOG: 1  Physical Exam Constitutional:      General: She is not in acute distress.    Appearance: She is not diaphoretic.  HENT:     Head: Normocephalic and atraumatic.     Mouth/Throat:     Pharynx: No oropharyngeal exudate.  Cardiovascular:     Rate and Rhythm: Normal rate and regular rhythm.     Heart sounds: Normal heart sounds. No murmur heard.  No friction rub. No gallop.   Pulmonary:     Effort: Pulmonary effort is normal. No respiratory distress.     Breath sounds: Normal breath sounds. No wheezing or rales.  Abdominal:     General: Bowel sounds are normal. There is distension.     Palpations: Abdomen is soft. There is no mass.     Tenderness: There is abdominal tenderness. There is no guarding or rebound.  Musculoskeletal:     Cervical back: Normal range of motion and neck supple.  Lymphadenopathy:     Cervical: No cervical adenopathy.  Skin:    General: Skin is warm and dry.     Findings: No erythema or rash.  Neurological:     Mental Status: She is alert.     Coordination: Coordination normal.  Psychiatric:        Behavior: Behavior normal.        Thought  Content: Thought content normal.        Judgment: Judgment normal.     Lab Review:     Component Value Date/Time   NA 140 03/06/2020 1024   NA 134 (L) 08/23/2017 0951   K 3.5 03/06/2020 1024   K 2.5 Repeated and Verified (LL) 08/23/2017 0951   CL 105 03/06/2020 1024   CO2 26 03/06/2020 1024   CO2 26 08/23/2017 0951   GLUCOSE 138 (H) 03/06/2020 1024   GLUCOSE 116 08/23/2017 0951   BUN 14 03/06/2020 1024   BUN 11.6 08/23/2017 0951   CREATININE 0.84 03/06/2020 1024   CREATININE 1.20 (H) 01/27/2019 1501   CREATININE 1.0 08/23/2017 0951   CALCIUM 9.8 03/06/2020 1024   CALCIUM 10.4 08/23/2017 0951   PROT 6.8 03/06/2020 1024   PROT 6.4 08/23/2017 0951   ALBUMIN 3.2 (L) 03/06/2020 1024   ALBUMIN 2.9 (L) 08/23/2017 0951   AST 52 (H) 03/06/2020 1024   AST 47 (H) 01/27/2019 1501   AST 208 (HH) 08/23/2017 0951   ALT 23 03/06/2020 1024   ALT 46 (H) 01/27/2019 1501   ALT 151 (H) 08/23/2017 0951   ALKPHOS 244 (H) 03/06/2020 1024   ALKPHOS 549 (H) 08/23/2017 0951   BILITOT 0.8 03/06/2020 1024   BILITOT 0.6 01/27/2019 1501   BILITOT 0.82 08/23/2017 0951   GFRNONAA >60 03/06/2020 1024   GFRNONAA 50 (L) 01/27/2019 1501   GFRAA >60 03/06/2020 1024   GFRAA 59 (L) 01/27/2019 1501       Component Value Date/Time   WBC 3.3 (L) 03/06/2020 1024   RBC 2.43 (L) 03/06/2020 1024   HGB 8.1 (L) 03/06/2020 1024   HGB 9.9 (L) 01/27/2019 1501   HGB 11.9 08/23/2017 0951   HCT 25.0 (L) 03/06/2020 1024   HCT 36.0 08/23/2017 0951   PLT 248 03/06/2020 1024   PLT 153 01/27/2019 1501   PLT 401 (H) 08/23/2017 0951   MCV 102.9 (H) 03/06/2020 1024  MCV 92.8 08/23/2017 0951   MCH 33.3 03/06/2020 1024   MCHC 32.4 03/06/2020 1024   RDW 20.4 (H) 03/06/2020 1024   RDW 14.2 08/23/2017 0951   LYMPHSABS 0.3 (L) 03/06/2020 1024   LYMPHSABS 0.8 (L) 08/23/2017 0951   MONOABS 0.4 03/06/2020 1024   MONOABS 1.3 (H) 08/23/2017 0951   EOSABS 0.0 03/06/2020 1024   EOSABS 0.1 08/23/2017 0951   BASOSABS 0.0  03/06/2020 1024   BASOSABS 0.0 08/23/2017 0951   -------------------------------  Imaging from last 24 hours (if applicable):  Radiology interpretation: DG Abd 1 View  Result Date: 03/28/2020 CLINICAL DATA:  Metastatic pancreatic cancer with abdominal distention. Pain and constipation. EXAM: ABDOMEN - 1 VIEW COMPARISON:  CT 03/13/2020. FINDINGS: Soft tissues of the abdomen are unremarkable. Stool noted throughout the colon. No bowel distention or free air. Punctate calcific densities noted left upper quadrant, possibly pill fragments. Mild lumbar spine scoliosis concave right. Degenerative changes lumbar spine and both hips. IMPRESSION: No acute abnormality identified. No bowel distention or free air. Stool noted throughout the colon. Constipation could present in this fashion. Electronically Signed   By: Marcello Moores  Register   On: 03/28/2020 10:36   CT Chest W Contrast  Result Date: 03/14/2020 CLINICAL DATA:  Patient with history of metastatic pancreatic carcinoma status post chemotherapy. Follow-up exam. EXAM: CT CHEST, ABDOMEN, AND PELVIS WITH CONTRAST TECHNIQUE: Multidetector CT imaging of the chest, abdomen and pelvis was performed following the standard protocol during bolus administration of intravenous contrast. CONTRAST:  136m OMNIPAQUE IOHEXOL 300 MG/ML  SOLN COMPARISON:  CT CP 01/09/2020; CT AP 01/21/2020 FINDINGS: CT CHEST FINDINGS Cardiovascular: Normal heart size. Trace fluid superior pericardial recess. Thoracic aortic vascular calcifications. Right anterior chest wall Port-A-Cath is present with tip terminating in the superior vena cava. Mediastinum/Nodes: No enlarged axillary, mediastinal or hilar lymphadenopathy. Normal appearance of the esophagus. Lungs/Pleura: Central airways are patent. Dependent atelectasis within the bilateral lower lobes. Biapical pleuroparenchymal thickening. Musculoskeletal: Possible 11 mm mass right breast (image 61; series 7). Thoracic spine degenerative  changes. No aggressive or acute appearing osseous lesions. CT ABDOMEN PELVIS FINDINGS Hepatobiliary: Innumerable hepatic metastasis are redemonstrated. Interval increase in size of right hepatic lobe lesion measuring 9.8 x 6.4 cm (image 29; series 4), previously 8.1 x 4.7 cm. Interval increase in size of hepatic dome lesion measuring 3.2 x 2.6 cm (image 15; series 4), previously 2.0 x 1.8 cm. Interval increase in size of anterior right hepatic lobe lesion measuring 2.1 cm (image 25; series 4), previously 1.8 cm. There are additional lesions which are similar in size when compared to prior exam. Contracted gallbladder with small stones. No intrahepatic or extrahepatic biliary ductal dilatation. Pancreas: Redemonstrated pancreatic body and tail ductal dilatation to the level of a pancreatic head/neck mass measuring 2.7 x 2.1 cm (image 107; series 7), previously 2.6 x 2.2 cm. Spleen: Splenomegaly. Adrenals/Urinary Tract: Normal adrenal glands. Kidneys enhance symmetrically with contrast. No hydronephrosis. Urinary bladder is unremarkable. Stomach/Bowel: There is wall thickening of the ascending colon and proximal transverse colon. Additional suggestion of wall thickening of the descending colon and rectum. There is mild wall thickening of a loop of proximal small bowel (image 135; series 7). Normal morphology of the stomach. Prominent loops of small bowel within the central and lower abdomen. No discrete transition point identified. Vascular/Lymphatic: Normal caliber abdominal aorta. Portacaval adenopathy measuring 2.9 x 2.8 cm (image 102; series 7), previously 2.3 x 3.1 cm. Additional upper abdominal mesenteric node is similar measuring 1.1 cm (image 100; series  7). Interval increase in size of 1.9 cm portal caval node (image 109; series 7), previously 1.4 cm. Reproductive: Small uterine fibroids. Other: Small volume perihepatic and pelvic ascites. Musculoskeletal: No aggressive or acute appearing osseous lesions.  IMPRESSION: 1. Similar size of pancreatic head/neck mass. 2. Mild interval increase in size of some of the hepatic metastasis. 3. Prominent loops of small bowel within the central and lower abdomen without discrete transition point identified. Findings may represent ileus. Additionally, there is mild wall thickening of a proximal loop of small bowel which is nonspecific. Findings may represent enteritis. 4. Wall thickening of the ascending colon and proximal transverse colon as well as descending colon and rectum raising the possibility of colitis. 5. Small volume perihepatic ascites. 6. Mildly progressed abdominal adenopathy. 7. Possible 11 mm mass within the right breast. Recommend correlation with mammography if clinically indicated given clinical condition. 8. Aortic atherosclerosis. 9. These results will be called to the ordering clinician or representative by the Radiologist Assistant, and communication documented in the PACS or [employer]. Electronically Signed   By: Lovey Newcomer M.D.   On: 03/14/2020 10:41   CT Abdomen Pelvis W Contrast  Result Date: 03/14/2020 CLINICAL DATA:  Patient with history of metastatic pancreatic carcinoma status post chemotherapy. Follow-up exam. EXAM: CT CHEST, ABDOMEN, AND PELVIS WITH CONTRAST TECHNIQUE: Multidetector CT imaging of the chest, abdomen and pelvis was performed following the standard protocol during bolus administration of intravenous contrast. CONTRAST:  169m OMNIPAQUE IOHEXOL 300 MG/ML  SOLN COMPARISON:  CT CP 01/09/2020; CT AP 01/21/2020 FINDINGS: CT CHEST FINDINGS Cardiovascular: Normal heart size. Trace fluid superior pericardial recess. Thoracic aortic vascular calcifications. Right anterior chest wall Port-A-Cath is present with tip terminating in the superior vena cava. Mediastinum/Nodes: No enlarged axillary, mediastinal or hilar lymphadenopathy. Normal appearance of the esophagus. Lungs/Pleura: Central airways are patent. Dependent atelectasis  within the bilateral lower lobes. Biapical pleuroparenchymal thickening. Musculoskeletal: Possible 11 mm mass right breast (image 61; series 7). Thoracic spine degenerative changes. No aggressive or acute appearing osseous lesions. CT ABDOMEN PELVIS FINDINGS Hepatobiliary: Innumerable hepatic metastasis are redemonstrated. Interval increase in size of right hepatic lobe lesion measuring 9.8 x 6.4 cm (image 29; series 4), previously 8.1 x 4.7 cm. Interval increase in size of hepatic dome lesion measuring 3.2 x 2.6 cm (image 15; series 4), previously 2.0 x 1.8 cm. Interval increase in size of anterior right hepatic lobe lesion measuring 2.1 cm (image 25; series 4), previously 1.8 cm. There are additional lesions which are similar in size when compared to prior exam. Contracted gallbladder with small stones. No intrahepatic or extrahepatic biliary ductal dilatation. Pancreas: Redemonstrated pancreatic body and tail ductal dilatation to the level of a pancreatic head/neck mass measuring 2.7 x 2.1 cm (image 107; series 7), previously 2.6 x 2.2 cm. Spleen: Splenomegaly. Adrenals/Urinary Tract: Normal adrenal glands. Kidneys enhance symmetrically with contrast. No hydronephrosis. Urinary bladder is unremarkable. Stomach/Bowel: There is wall thickening of the ascending colon and proximal transverse colon. Additional suggestion of wall thickening of the descending colon and rectum. There is mild wall thickening of a loop of proximal small bowel (image 135; series 7). Normal morphology of the stomach. Prominent loops of small bowel within the central and lower abdomen. No discrete transition point identified. Vascular/Lymphatic: Normal caliber abdominal aorta. Portacaval adenopathy measuring 2.9 x 2.8 cm (image 102; series 7), previously 2.3 x 3.1 cm. Additional upper abdominal mesenteric node is similar measuring 1.1 cm (image 100; series 7). Interval increase in size of 1.9  cm portal caval node (image 109; series 7),  previously 1.4 cm. Reproductive: Small uterine fibroids. Other: Small volume perihepatic and pelvic ascites. Musculoskeletal: No aggressive or acute appearing osseous lesions. IMPRESSION: 1. Similar size of pancreatic head/neck mass. 2. Mild interval increase in size of some of the hepatic metastasis. 3. Prominent loops of small bowel within the central and lower abdomen without discrete transition point identified. Findings may represent ileus. Additionally, there is mild wall thickening of a proximal loop of small bowel which is nonspecific. Findings may represent enteritis. 4. Wall thickening of the ascending colon and proximal transverse colon as well as descending colon and rectum raising the possibility of colitis. 5. Small volume perihepatic ascites. 6. Mildly progressed abdominal adenopathy. 7. Possible 11 mm mass within the right breast. Recommend correlation with mammography if clinically indicated given clinical condition. 8. Aortic atherosclerosis. 9. These results will be called to the ordering clinician or representative by the Radiologist Assistant, and communication documented in the PACS or [employer]. Electronically Signed   By: Lovey Newcomer M.D.   On: 03/14/2020 10:41        This case was discussed with Dr. Burr Medico. She expressed agreement with my management of this patient.

## 2020-03-29 ENCOUNTER — Other Ambulatory Visit: Payer: Self-pay | Admitting: Hematology

## 2020-03-29 DIAGNOSIS — I82402 Acute embolism and thrombosis of unspecified deep veins of left lower extremity: Secondary | ICD-10-CM

## 2020-04-04 NOTE — Progress Notes (Signed)
[place]   [phone] [phone]   Clinic Follow up Note   Patient Care Team: [name], MD as PCP - General (Family Medicine) [name], MD as Consulting Physician (Otolaryngology) [name], MD as Consulting Physician (Oncology)  I connected with Morgan Dawson on 04/05/2020 at  1:20 PM EDT by video enabled telemedicine visit and verified that I am speaking with the correct person using two identifiers.  I discussed the limitations, risks, security and privacy concerns of performing an evaluation and management service by telephone and the availability of in person appointments. I also discussed with the patient that there may be a patient responsible charge related to this service. The patient expressed understanding and agreed to proceed.   Other persons participating in the visit and their role in the encounter:  Husband   Patient's location:  Her home  Provider's location:  My Office    CHIEF COMPLAINT: F/u of pancreatic cancer  SUMMARY OF ONCOLOGIC HISTORY: Oncology History Overview Note  Cancer Staging Pancreatic cancer [facility]) Staging form: Exocrine Pancreas, AJCC 8th Edition - Clinical stage from 08/06/2017: Stage IV (cTX, cN0, pM1) - Signed by [name], MD on 08/12/2017     Pancreatic cancer ([place])  07/23/2017 Imaging   US abdomen limited RUQ 07/23/17 IMPRESSION: 1. Cholelithiasis.  No secondary signs of acute cholecystitis. 2. Multiple solid liver masses measuring up to 6.5 cm in the left lobe of the liver, evaluation for metastatic disease is recommended. These results will be called to the ordering clinician or representative by the Radiologist Assistant, and communication documented in the PACS or zVision Dashboard.   07/23/2017 Imaging   CT Abdomen W Contrast 07/23/17 IMPRESSION: 1. Widespread metastatic disease throughout the liver. No clear primary malignancy identified in the abdomen. The pelvis was  not imaged. Tissue sampling recommended. 2. Probable adenopathy superior to the pancreatic tail. No evidence of pancreatic mass. 3. Suspected incidental hemangioma inferiorly in the right hepatic lobe. 4. Nonspecific nodularity in the breasts. The patient has undergone recent (03/25/2017 and 04/01/2017) mammography and ultrasound.   07/29/2017 Initial Diagnosis   Metastasis to liver of unknown origin ([place])   07/29/2017 PET scan   PET 07/29/17  IMPRESSION: 1. Numerous bulky liver masses are hypermetabolic compatible with malignancy. 2. Accentuated activity within or along the pancreatic tail, likely represent a primary pancreatic tumor. Consider pancreatic protocol MRI to further work this up. 3. The peripancreatic lymph node shown above the pancreatic tail is mildly hypermetabolic favoring malignancy. 4.  Prominent stool throughout the colon favors constipation. 5. 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   08/05/2017 Pathology Results   Liver Biopsy  Diagnosis 08/05/17 Liver, needle/core biopsy - CARCINOMA. - SEE COMMENT. Microscopic Comment The malignant cells are positive for cytokeratin 7. They are negative for arginase, CDX2, cytokeratin 20, estrogen receptor, GATA-3, GCDFP, Glypican 3, Hep Par 1, Napsin A, and TTF-1. This immunohistochemical is nonspecific. Possibly primary sources include pancreatobiliary and upper gastrointestinal. Radiologic correlation is necessary. Of note, organ specific markers (GATA-3, GCDFP-breast, TTF-1, Napsin A-lung, and CDX2-colon) are negative. (JBK:ecj 08/09/2017)   08/21/2017 - 02/10/2018 Chemotherapy   FOLFIRINOX every 2 weeks starting 08/21/17. Dose reduced due to neuropahty and cytopenia    10/14/2017 Imaging   CT CAP WO Contrast 10/14/17 IMPRESSION: Evidence of known numerous liver metastases without significant interval change. No other evidence of metastatic disease within the chest, abdomen or pelvis. Cholelithiasis. Tiny  pericardial effusion.   12/02/2017 Genetic Testing   Negative for pathogenic  mutation.  The genes analyzed were the 83 genes on [facility]'s Multi-Cancer panel (ALK, APC, ATM, AXIN2, BAP1, BARD1, BLM, BMPR1A, BRCA1, BRCA2, BRIP1, CASR, CDC73, CDH1, CDK4, CDKN1B, CDKN1C, CDKN2A, CEBPA, CHEK2, CTNNA1, DICER1, DIS3L2, EGFR, EPCAM, FH, FLCN, GATA2, GPC3, GREM1, HOXB13, HRAS, KIT, MAX, MEN1, MET, MITF, MLH1, MSH2, MSH3, MSH6, MUTYH, NBN, NF1, NF2, NTHL1, PALB2, PDGFRA, PHOX2B, PMS2, POLD1, POLE, POT1, PRKAR1A, PTCH1, PTEN, RAD50, RAD51C, RAD51D, RB1, RECQL4, RET, RUNX1, SDHA, SDHAF2, SDHB, SDHC, SDHD, SMAD4, SMARCA4, SMARCB1, SMARCE1, STK11, SUFU, TERC, TERT, TMEM127, TP53, TSC1, TSC2, VHL, WRN, WT1).   12/17/2017 PET scan   IMPRESSION: 1. Although the liver metastases are still visible on the CT images, they are significantly smaller and retain no abnormal metabolic activity, consistent with response to therapy. 2. No abnormal activity within the pancreas. 3. No disease progression identified. 4. Cholelithiasis.   02/21/2018 PET scan   02/21/2018 PET Scan  IMPRESSION: 1. There are 2 new small nodules identified within the right lung which measure up to 5 mm. These are too small to reliably characterize by PET-CT, but warrant close interval follow-up. 2. Again noted are multifocal liver metastasis. The target lesion within the left lobe is slightly decreased in size when compared with the previous exam. Similar to previous exam there is no abnormal hypermetabolism above background liver activity identified within the liver lesions. 3. Gallstones.   02/23/2018 - 09/04/2018 Chemotherapy   5-FU pump infusion and liposomal irinotecan, every 2 weeks.  First cycle dose reduced due to cytopenia in the travel. Stopped on 08/25/2018 due to disease progression.    05/17/2018 Imaging   05/17/2018 PET Scan IMPRESSION: 1. Mixed response. The 2 small right lung nodules have decreased in size in the interval. Single  focus of increased uptake within the liver is new from 02/21/2018 and concerning for recurrent metabolically active liver metastasis. 2. Splenomegaly.  New from previous exam. 3. Diffusely increased bone marrow activity, likely reflecting treatment related changes.   09/01/2018 PET scan   PET 09/01/18 IMPRESSION: 1. Unfortunately there recurrence of hepatic metastasis with multiple new hypermetabolic lesions in LEFT and RIGHT hepatic lobe. 2. New extra hepatic site of malignancy which appears associated the mid pancreas. Difficult to define lesion on noncontrast exam. 3. Diffuse marrow activity is favored benign. 4. No evidence of pulmonary metastasis.   09/12/2018 - 03/27/2019 Chemotherapy   second-line Gemcitabine and Abraxane for 2 weeks on, 1 week off starting 09/12/2018. Due to neuropathy, I will start her with dose reduced Abraxane. Stopped after 03/27/19 due to disease progression.    12/14/2018 Imaging   CT CAP  IMPRESSION: CT demonstrates acute cholecystitis, with choledocholithiasis of the cystic duct.  These results were discussed by telephone at the time of interpretation on 12/14/2018 at 5:05 pm with Dr. Burr Medico.  Redemonstration of known liver metastases, with positive response to therapy, interval reduction in size of all lesions, with multiple demonstrating significant internal necrosis.  No acute finding of the chest.  Ancillary findings as above.   03/21/2019 Imaging   CT CAP W Contrast IMPRESSION: 1. Numerous rim enhancing liver metastases show overall trend towards mild progression although 1 of the index lesions is stable in the interval. 2. Interval development of portocaval lymphadenopathy concerning for disease progression. 3. Slight progression of main duct dilatation in the pancreas with abrupt cut off in the region of the body. 4. Interval development of a 9 mm subpleural nodule at the right base along the posterior hemidiaphragm. Close attention on  follow-up recommended as  metastatic disease not excluded. 5. New splenomegaly. 6. Cholelithiasis with diffuse gallbladder wall thickening. Gallbladder wall thickening is decreased in the interval. Stones and sludge noted in the gallbladder lumen.   04/09/2019 Imaging   CT AP W Contrast 04/09/19  IMPRESSION: 1. Multiple gallstones in lumen gallbladder without evidence acute cholecystitis. No significant change from prior. No biliary duct dilatation. Common bile duct normal. 2. Widespread hepatic metastasis unchanged from comparison CT. 3. Chronic pancreatic duct dilatation in the body and tail unchanged. 4. No bowel obstruction. 5. Small amount of intraperitoneal free fluid unchanged.   04/10/2019 - 03/15/2020 Chemotherapy   Third-line GTX with Xeloda '1000mg'$  BID day 1-14, off for 1 week with Docetaxel '30mg'$ /m2 and Gemcitabine '750mg'$ /m2 on day 4 and day 11 every 3 weeks starting oral chemo on 04/10/19. Starting 07/06/19 she will reduce Xeloda to '1000mg'$  in the AM and '500mg'$  in the PM due to low blood counts. D/c 03/15/20 due to disease progression in liver. Last dose taken 03/09/20.   07/03/2019 Imaging   CT CAP W Contrast  IMPRESSION: 1. Overall stable diffuse hepatic metastatic disease. No new/progressive findings. 2. Stable to slightly larger periportal lymph node. 3. Stable CT appearance of the pancreas. Abrupt cut off of a dilated main pancreatic duct in the body/head junction region without obvious pancreatic lesion. 4. No pulmonary nodules are identified at the lung bases. 5. Stable mild splenomegaly. 6. No new/acute findings.     10/17/2019 Imaging   CT CAP W Contrast  IMPRESSION: 1. Overall mild progression of metastatic disease. Portacaval lymphadenopathy has increased. Enlarging lesion along the posterior right diaphragm is suspicious for growing right pleural metastasis. 2. Bulky liver metastatic disease is relatively stable with mild mixed changes as detailed. 3. Pancreatic  body tumor is stable. 4. Moderate splenomegaly, increased. 5. Aortic Atherosclerosis (ICD10-I70.0). Additional chronic findings as detailed.       01/09/2020 Imaging   CT CHEST/ABD/PEL IMPRESSION 1. No specific evidence of metastatic disease within the chest. 2. New small right pleural effusion. 1. Relatively similar size of pancreatic primary. 2. Although direct comparison of liver metastasis is challenging secondary to differences in bolus timing, mild progression is identified. 3. Mild progression of abdominal adenopathy. 4. Persistent hepatosplenomegaly. 5. Trace perihepatic ascites, similar. 6. Borderline small bowel dilatation, without focal transition point. Ileus versus low-grade partial small bowel obstruction. 7. Cholelithiasis.   01/21/2020 Imaging   CT AP  IMPRESSION: 1. No significant change of pancreatic fullness/mass, innumerable hepatic masses, abdominal lymphadenopathy and small amount of ascites compatible with metastatic disease. 2. Unchanged splenomegaly. 3. Decreased small RIGHT pleural effusion. Bibasilar atelectasis again noted. 4. Aortic Atherosclerosis (ICD10-I70.0).     03/13/2020 Imaging   CT CAP W contrast  IMPRESSION: 1. Similar size of pancreatic head/neck mass. 2. Mild interval increase in size of some of the hepatic metastasis. 3. Prominent loops of small bowel within the central and lower abdomen without discrete transition point identified. Findings may represent ileus. Additionally, there is mild wall thickening of a proximal loop of small bowel which is nonspecific. Findings may represent enteritis. 4. Wall thickening of the ascending colon and proximal transverse colon as well as descending colon and rectum raising the possibility of colitis. 5. Small volume perihepatic ascites. 6. Mildly progressed abdominal adenopathy. 7. Possible 11 mm mass within the right breast. Recommend correlation with mammography if clinically indicated  given clinical condition. 8. Aortic atherosclerosis. 9. These results will be called to the ordering clinician or representative by the Radiologist Assistant, and communication documented  in the PACS or [employer].     Pancreatic cancer metastasized to liver ([place])  08/11/2017 Initial Diagnosis   Pancreatic cancer metastasized to liver [facility])      CURRENT THERAPY:  Supportive Care   INTERVAL HISTORY:  Morgan Dawson is here for a follow up. She was feeling more nauseous today and could not make it into the clinic today so we did a face to face virtual visit over [place]. She also notes the room is spinning for her due to her Meniere's disease/Vertigo. She notes her episode onsets are severe and after a few minutes will be "wavy" for a few hours. She has been having these episodes more frequently lately. She notes she had explosive diarrhea on 03/20/20 and she is unsure of cause. She still overall has back and forth diarrhea and constipation. She mostly has looser stool once daily with little output. She notes she is not eating as much due to early Satiety. She mostly snacks through out the day with less fiber. She denies any current pain. She notes a small cough when she changes positions at home. She denies phlegm, fever or chills. She notes her hemorrhoids have been bothering her. She denies bleeding from hemorrhoids. She has been using daily Preparation-H cream daily for about 1 month. She noes having weepy eye. She notes occasional yellowing discharge of her eyes. R>L. She also notes b/l LE edema, evenly. She notes she stopped her Prometa.    She notes she still plans to go to [place] in 12 days.    REVIEW OF SYSTEMS:   Constitutional: Denies fevers, chills (+) Lower appetite (+) Dizziness  Eyes: Denies blurriness of vision (+) Watery eyes. (+) occasional yellowing discharge of her eyes. R>L. Ears, nose, mouth, throat, and face: Denies mucositis or sore throat  Respiratory: Denies  dyspnea or wheezes (+) Cough  Cardiovascular: Denies palpitation, chest discomfort (+) lower extremity swelling Gastrointestinal:  Denies nausea, heartburn or change in bowel habits (+) Nausea  Skin: Denies abnormal skin rashes Lymphatics: Denies new lymphadenopathy or easy bruising Neurological:Denies numbness, tingling or new weaknesses Behavioral/Psych: Mood is stable, no new changes  All other systems were reviewed with the patient and are negative.  MEDICAL HISTORY:  Past Medical History:  Diagnosis Date  . Diarrhea of presumed infectious origin 01/12/2018  . Gastroenteritis 01/12/2018  . Genetic testing 12/02/2017   Multi-Cancer panel (83 genes) @ [facility] - No pathogenic mutations detected  . Meniere disease 2016  . met pancreatic ca to liver dx'd 07/2017  . Pancreatitis     SURGICAL HISTORY: Past Surgical History:  Procedure Laterality Date  . CERVICAL ABLATION    . ENDOMETRIAL ABLATION  2011  . IR CHOLANGIOGRAM EXISTING TUBE  01/09/2019  . IR FLUORO GUIDE PORT INSERTION RIGHT  08/12/2017  . IR PERC CHOLECYSTOSTOMY  12/15/2018  . IR US GUIDE VASC ACCESS RIGHT  08/12/2017  . [place]    I have reviewed the social history and family history with the patient and they are unchanged from previous note.  ALLERGIES:  has No Known Allergies.  MEDICATIONS:  Current Outpatient Medications  Medication Sig Dispense Refill  . b complex vitamins tablet Take 1 tablet by mouth daily.    . capecitabine (XELODA) 500 MG tablet TAKE 2 TABLETS BY MOUTH IN THE MORNING AND 1 TABLET IN THE EVENING, EVERY 12 HOURS, WITHIN 30 MINUTES OF MEALS ON DAYS 1 TO 14 OF EACH 21 DAY CYCLE (Patient taking differently: 500-1,000 mg  See admin instructions. Takes 2 tablets by mouth in the morning and 1 tablet in the ebvening, every 12 hours, within 30 minutes of meals on days 1 to 14 of each 21 day cycle.) 42 tablet 3  . Cholecalciferol (VITAMIN D3 GUMMIES ADULT) 25 MCG (1000 UT) CHEW Chew 2,000  Units by mouth daily.     . ciprofloxacin (CILOXAN) 0.3 % ophthalmic solution Place 1 drop into both eyes every 4 (four) hours while awake. Administer 1 drop, every 2 hours, while awake, for 2 days. Then 1 drop, every 4 hours, while awake, for the next 5 days. 5 mL 0  . cyclobenzaprine (FLEXERIL) 5 MG tablet Take 1 tablet (5 mg total) by mouth 3 (three) times daily as needed for muscle spasms. 30 tablet 0  . gabapentin (NEURONTIN) 100 MG capsule Take 1 capsule (100 mg total) by mouth 3 (three) times daily. (Patient taking differently: Take 100 mg by mouth in the morning and at bedtime. ) 270 capsule 2  . lidocaine-prilocaine (EMLA) cream Apply 1 application topically as needed. 30 g 2  . loperamide (IMODIUM) 2 MG capsule Take 1 capsule (2 mg total) by mouth as needed for diarrhea or loose stools. 30 capsule 0  . loratadine (CLARITIN) 10 MG tablet Take 10 mg by mouth daily as needed for allergies.    . magic mouthwash w/lidocaine SOLN Take 10 mLs by mouth 3 (three) times daily as needed for mouth pain. Swish and spit 10 ML by mouth 3 times daily as needed for mouth pain. 240 mL 1  . meclizine (ANTIVERT) 25 MG tablet Take 25 mg by mouth 3 (three) times daily as needed for dizziness.    . ondansetron (ZOFRAN-ODT) 8 MG disintegrating tablet TAKE 1 TABLET BY MOUTH EVERY 8 HOURS AS NEEDED FOR NAUSE OR VOMITING (Patient taking differently: Take 8 mg by mouth every 8 (eight) hours as needed for nausea or vomiting. ) 40 tablet 1  . potassium chloride (KLOR-CON) 10 MEQ tablet Take 1 tablet (10 mEq total) by mouth 4 (four) times daily. 360 tablet 2  . prochlorperazine (COMPAZINE) 10 MG tablet Take 1 tablet (10 mg total) by mouth every 6 (six) hours as needed for nausea or vomiting. (Patient not taking: Reported on 01/21/2020) 40 tablet 2  . PROMACTA 50 MG tablet TAKE 1 TABLET BY MOUTH 1 TIME A DAY. (Patient taking differently: Take 50 mg by mouth daily. ) 30 tablet 1  . traMADol (ULTRAM) 50 MG tablet Take 1 tablet  (50 mg total) by mouth every 6 (six) hours as needed for moderate pain or severe pain. 30 tablet 0  . XARELTO 15 MG TABS tablet TAKE 1 TABLET BY MOUTH EVERY DAY 90 tablet 1   No current facility-administered medications for this visit.   Facility-Administered Medications Ordered in Other Visits  Medication Dose Route Frequency Provider Last Rate Last Admin  . sodium chloride flush (NS) 0.9 % injection 10 mL  10 mL Intracatheter Once [name], MD        PHYSICAL EXAMINATION: ECOG PERFORMANCE STATUS: 3 - Symptomatic, >50% confined to bed  No vitals taken today, Exam not performed today  LABORATORY DATA:  I have reviewed the data as listed CBC Latest Ref Rng & Units 03/06/2020 02/28/2020 02/15/2020  WBC 4.0 - 10.5 K/uL 3.3(L) 6.0 2.7(L)  Hemoglobin 12.0 - 15.0 g/dL 8.1(L) 8.8(L) 8.9(L)  Hematocrit 36 - 46 % 25.0(L) 27.6(L) 28.2(L)  Platelets 150 - 400 K/uL 248 358 315  CMP Latest Ref Rng & Units 03/06/2020 02/28/2020 02/15/2020  Glucose 70 - 99 mg/dL 138(H) 108(H) 137(H)  BUN 6 - 20 mg/dL '14 14 15  '$ Creatinine 0.44 - 1.00 mg/dL 0.84 0.93 1.00  Sodium 135 - 145 mmol/L 140 140 138  Potassium 3.5 - 5.1 mmol/L 3.5 3.5 3.6  Chloride 98 - 111 mmol/L 105 104 103  CO2 22 - 32 mmol/L '26 25 26  '$ Calcium 8.9 - 10.3 mg/dL 9.8 9.2 9.8  Total Protein 6.5 - 8.1 g/dL 6.8 6.9 7.2  Total Bilirubin 0.3 - 1.2 mg/dL 0.8 1.1 0.9  Alkaline Phos 38 - 126 U/L 244(H) 250(H) 275(H)  AST 15 - 41 U/L 52(H) 54(H) 52(H)  ALT 0 - 44 U/L 23 23 32      RADIOGRAPHIC STUDIES: I have personally reviewed the radiological images as listed and agreed with the findings in the report. No results found.   ASSESSMENT & PLAN:  MAKAYLEE SPIELBERG is a 59 y.o. female with    1. 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 She  progressed on this based on 08/2018 PET scan. -She mildly progressed onsecond-line Gemcitabine and Abraxanein liver and new lung nodule.  -She is currently onthird-line GTX with Xeloda '1000mg'$  BID 2 weeks on/1 week off andDocetaxelandGemcitabineon day 4 and 11 q3weeks starting 04/10/19.she required dose adjustment and GCSF -She previously went to [facility] and [facility] for clinical trial -Her CT CAP from 03/13/20 showed stable pancreatic head mass and interval increase in size of liver mets. Overall she has disease progression, will d/c GTX.  -She has recently consulted with her oncologist at [facility] and has declined their clinical trail.  -Due to her significant neuropathy, I do not think she can tolerate oxaliplatin based therapy.  I do not have more standard treatment options for her. -In the past month she has had more episodes of vertigo, LE edema, Weepy eyes with yellowish discharge, and continues to have irregular BMs. I reviewed symptoms management with her in detail. -will call in cipro eye drop, and Creon for her to try  -I will f/u with her in office next week with labs, and discuss hospice.    2.Abdominal pain -Pain is not often. She rarely uses Tramadol. -Will watch constipation on pain medications.   3.Malnutrition, Diarrhea/constipation, Bloating  -Secondaryto chemo and underlying metastatic cancer. -Continue nutritional supplement and f/u with dietician. -Outside of episodes of constipation and explosive diarrhea she mostly has loose stool 1-3 times a day. With decreased appetite, food intake and decreased fiber, she may experience pancreatic insufficiency. If her diarrhea is related to this, creon may help.  -She also eats less due to early satiety and BMs.   4.Peripheral neuropathy, grade 2-3  -Currently on Neurontin 200 mg at night and Bcomplex.She used ice bags during infusion. -Has progressed with more treatment. This is more present in her feet than her  hands.  -Will further reduce dose Docetaxel dose starting with C16. -Given disease progression will d/c GTX.   5.H/o Acute cholecystitis, status post cholecystectomy tube placement 4/23/20per IR -She was seen by surgery during herhospitalization, surgery was not recommended due to high risk of complication, and concerns for cancer progression when holding chemotherapy for surgery -Her CT CAP from 12/14/18 shows she has numerus gallstones. Imaging in 12/2018 consistent with cholecystitis with cystic duct obstruction. No leak on  HIDA. -Noevidence of recurrent infectionrecently.  6. LE DVT -Diagnosed on December 15, 2018, when she was hospitalized for cholecystitis. -On low-doseXarelto'15mg'$  daily, she held it recently by herself. -She has recently had b/l edema, evenly per Pt. Will evaluate with exam next week. She can contact clinic if this changes.   7.Goal of care discussion, DNR/DNI -The patient understands the goal of care is palliative. -I previously recommended DNR/DNI, sheagreed. She has living will set up.  8. Meniere's Disease/Vertigo  -she has had this for many years.  -She has current episode today with nausea.  -I recommend she start OTC Meclizine and get rest, she is agreeable.   9. Watery Eyes -Lately she has had watery eyes with occasional yellowish discharge.  -I will call in eye drop antibiotics.    Plan -I called in cipro eye drops and creon  -use OTC Meclizine for vertigo  -Lab, flush and f/u next week    No problem-specific Assessment & Plan notes found for this encounter.   No orders of the defined types were placed in this encounter.  I discussed the assessment and treatment plan with the patient. The patient was provided an opportunity to ask questions and all were answered. The patient agreed with the plan and demonstrated an understanding of the instructions.  The patient was advised to call back or seek an in-person evaluation if the  symptoms worsen or if the condition fails to improve as anticipated.  The total time spent in the appointment was 30 minutes.     [name], MD 04/05/2020   I, Joslyn Devon, am acting as scribe for [name], MD.   I have reviewed the above documentation for accuracy and completeness, and I agree with the above.

## 2020-04-05 ENCOUNTER — Inpatient Hospital Stay (HOSPITAL_BASED_OUTPATIENT_CLINIC_OR_DEPARTMENT_OTHER): Payer: BLUE CROSS/BLUE SHIELD | Admitting: Hematology

## 2020-04-05 ENCOUNTER — Inpatient Hospital Stay: Payer: BLUE CROSS/BLUE SHIELD

## 2020-04-05 ENCOUNTER — Encounter: Payer: Self-pay | Admitting: Hematology

## 2020-04-05 DIAGNOSIS — C259 Malignant neoplasm of pancreas, unspecified: Secondary | ICD-10-CM

## 2020-04-05 MED ORDER — CIPROFLOXACIN HCL 0.3 % OP SOLN: 1.0000 [drp] | OPHTHALMIC | 0 refills | Status: AC

## 2020-04-05 MED ORDER — PANCRELIPASE (LIP-PROT-AMYL) 36000-114000 UNITS PO CPEP
ORAL_CAPSULE | ORAL | 0 refills | Status: DC
Start: 2020-04-05 — End: 2020-04-26

## 2020-04-08 ENCOUNTER — Telehealth: Payer: Self-pay | Admitting: Hematology

## 2020-04-08 NOTE — Telephone Encounter (Signed)
Scheduled per 8/13 los. Pt is aware of appt time and date. 

## 2020-04-10 NOTE — Progress Notes (Signed)
[place]   Telephone:([phone]) [phone] Fax:(336) [phone]   Clinic Follow up Note   Patient Care Team: [name], MD as PCP - General (Family Medicine) [name], MD as Consulting Physician (Otolaryngology) [name], MD as Consulting Physician (Oncology)  Date of Service:  04/11/2020  CHIEF COMPLAINT: F/u of pancreatic cancer  SUMMARY OF ONCOLOGIC HISTORY: Oncology History Overview Note  Cancer Staging Pancreatic cancer [facility]) Staging form: Exocrine Pancreas, AJCC 8th Edition - Clinical stage from 08/06/2017: Stage IV (cTX, cN0, pM1) - Signed by [name], MD on 08/12/2017     Pancreatic cancer ([place])  07/23/2017 Imaging   US abdomen limited RUQ 07/23/17 IMPRESSION: 1. Cholelithiasis.  No secondary signs of acute cholecystitis. 2. Multiple solid liver masses measuring up to 6.5 cm in the left lobe of the liver, evaluation for metastatic disease is recommended. These results will be called to the ordering clinician or representative by the Radiologist Assistant, and communication documented in the PACS or zVision Dashboard.   07/23/2017 Imaging   CT Abdomen W Contrast 07/23/17 IMPRESSION: 1. Widespread metastatic disease throughout the liver. No clear primary malignancy identified in the abdomen. The pelvis was not imaged. Tissue sampling recommended. 2. Probable adenopathy superior to the pancreatic tail. No evidence of pancreatic mass. 3. Suspected incidental hemangioma inferiorly in the right hepatic lobe. 4. Nonspecific nodularity in the breasts. The patient has undergone recent (03/25/2017 and 04/01/2017) mammography and ultrasound.   07/29/2017 Initial Diagnosis   Metastasis to liver of unknown origin ([place])   07/29/2017 PET scan   PET 07/29/17  IMPRESSION: 1. Numerous bulky liver masses are hypermetabolic compatible with malignancy. 2. Accentuated activity within or along the pancreatic tail, likely represent a primary pancreatic tumor.  Consider pancreatic protocol MRI to further work this up. 3. The peripancreatic lymph node shown above the pancreatic tail is mildly hypermetabolic favoring malignancy. 4.  Prominent stool throughout the colon favors constipation. 5. 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   08/05/2017 Pathology Results   Liver Biopsy  Diagnosis 08/05/17 Liver, needle/core biopsy - CARCINOMA. - SEE COMMENT. Microscopic Comment The malignant cells are positive for cytokeratin 7. They are negative for arginase, CDX2, cytokeratin 20, estrogen receptor, GATA-3, GCDFP, Glypican 3, Hep Par 1, Napsin A, and TTF-1. This immunohistochemical is nonspecific. Possibly primary sources include pancreatobiliary and upper gastrointestinal. Radiologic correlation is necessary. Of note, organ specific markers (GATA-3, GCDFP-breast, TTF-1, Napsin A-lung, and CDX2-colon) are negative. (JBK:ecj 08/09/2017)   08/21/2017 - 02/10/2018 Chemotherapy   FOLFIRINOX every 2 weeks starting 08/21/17. Dose reduced due to neuropahty and cytopenia    10/14/2017 Imaging   CT CAP WO Contrast 10/14/17 IMPRESSION: Evidence of known numerous liver metastases without significant interval change. No other evidence of metastatic disease within the chest, abdomen or pelvis. Cholelithiasis. Tiny pericardial effusion.   12/02/2017 Genetic Testing   Negative for pathogenic mutation.  The genes analyzed were the 83 genes on [facility]'s Multi-Cancer panel (ALK, APC, ATM, AXIN2, BAP1, BARD1, BLM, BMPR1A, BRCA1, BRCA2, BRIP1, CASR, CDC73, CDH1, CDK4, CDKN1B, CDKN1C, CDKN2A, CEBPA, CHEK2, CTNNA1, DICER1, DIS3L2, EGFR, EPCAM, FH, FLCN, GATA2, GPC3, GREM1, HOXB13, HRAS, KIT, MAX, MEN1, MET, MITF, MLH1, MSH2, MSH3, MSH6, MUTYH, NBN, NF1, NF2, NTHL1, PALB2, PDGFRA, PHOX2B, PMS2, POLD1, POLE, POT1, PRKAR1A, PTCH1, PTEN, RAD50, RAD51C, RAD51D, RB1, RECQL4, RET, RUNX1, SDHA, SDHAF2, SDHB, SDHC, SDHD, SMAD4, SMARCA4, SMARCB1, SMARCE1, STK11, SUFU, TERC, TERT,  TMEM127, TP53, TSC1, TSC2, VHL, WRN, WT1).   12/17/2017 PET scan   IMPRESSION: 1. Although the liver metastases  are still visible on the CT images, they are significantly smaller and retain no abnormal metabolic activity, consistent with response to therapy. 2. No abnormal activity within the pancreas. 3. No disease progression identified. 4. Cholelithiasis.   02/21/2018 PET scan   02/21/2018 PET Scan  IMPRESSION: 1. There are 2 new small nodules identified within the right lung which measure up to 5 mm. These are too small to reliably characterize by PET-CT, but warrant close interval follow-up. 2. Again noted are multifocal liver metastasis. The target lesion within the left lobe is slightly decreased in size when compared with the previous exam. Similar to previous exam there is no abnormal hypermetabolism above background liver activity identified within the liver lesions. 3. Gallstones.   02/23/2018 - 09/04/2018 Chemotherapy   5-FU pump infusion and liposomal irinotecan, every 2 weeks.  First cycle dose reduced due to cytopenia in the travel. Stopped on 08/25/2018 due to disease progression.    05/17/2018 Imaging   05/17/2018 PET Scan IMPRESSION: 1. Mixed response. The 2 small right lung nodules have decreased in size in the interval. Single focus of increased uptake within the liver is new from 02/21/2018 and concerning for recurrent metabolically active liver metastasis. 2. Splenomegaly.  New from previous exam. 3. Diffusely increased bone marrow activity, likely reflecting treatment related changes.   09/01/2018 PET scan   PET 09/01/18 IMPRESSION: 1. Unfortunately there recurrence of hepatic metastasis with multiple new hypermetabolic lesions in LEFT and RIGHT hepatic lobe. 2. New extra hepatic site of malignancy which appears associated the mid pancreas. Difficult to define lesion on noncontrast exam. 3. Diffuse marrow activity is favored benign. 4. No evidence of pulmonary  metastasis.   09/12/2018 - 03/27/2019 Chemotherapy   second-line Gemcitabine and Abraxane for 2 weeks on, 1 week off starting 09/12/2018. Due to neuropathy, I will start her with dose reduced Abraxane. Stopped after 03/27/19 due to disease progression.    12/14/2018 Imaging   CT CAP  IMPRESSION: CT demonstrates acute cholecystitis, with choledocholithiasis of the cystic duct.  These results were discussed by telephone at the time of interpretation on 12/14/2018 at 5:05 pm with Dr. Burr Medico.  Redemonstration of known liver metastases, with positive response to therapy, interval reduction in size of all lesions, with multiple demonstrating significant internal necrosis.  No acute finding of the chest.  Ancillary findings as above.   03/21/2019 Imaging   CT CAP W Contrast IMPRESSION: 1. Numerous rim enhancing liver metastases show overall trend towards mild progression although 1 of the index lesions is stable in the interval. 2. Interval development of portocaval lymphadenopathy concerning for disease progression. 3. Slight progression of main duct dilatation in the pancreas with abrupt cut off in the region of the body. 4. Interval development of a 9 mm subpleural nodule at the right base along the posterior hemidiaphragm. Close attention on follow-up recommended as metastatic disease not excluded. 5. New splenomegaly. 6. Cholelithiasis with diffuse gallbladder wall thickening. Gallbladder wall thickening is decreased in the interval. Stones and sludge noted in the gallbladder lumen.   04/09/2019 Imaging   CT AP W Contrast 04/09/19  IMPRESSION: 1. Multiple gallstones in lumen gallbladder without evidence acute cholecystitis. No significant change from prior. No biliary duct dilatation. Common bile duct normal. 2. Widespread hepatic metastasis unchanged from comparison CT. 3. Chronic pancreatic duct dilatation in the body and tail unchanged. 4. No bowel obstruction. 5. Small  amount of intraperitoneal free fluid unchanged.   04/10/2019 - 03/15/2020 Chemotherapy   Third-line GTX with Xeloda  104m BID day 1-14, off for 1 week with Docetaxel 321mm2 and Gemcitabine 75043m2 on day 4 and day 11 every 3 weeks starting oral chemo on 04/10/19. Starting 07/06/19 she will reduce Xeloda to 1000m80m the AM and 500mg15mthe PM due to low blood counts. D/c 03/15/20 due to disease progression in liver. Last dose taken 03/09/20.   07/03/2019 Imaging   CT CAP W Contrast  IMPRESSION: 1. Overall stable diffuse hepatic metastatic disease. No new/progressive findings. 2. Stable to slightly larger periportal lymph node. 3. Stable CT appearance of the pancreas. Abrupt cut off of a dilated main pancreatic duct in the body/head junction region without obvious pancreatic lesion. 4. No pulmonary nodules are identified at the lung bases. 5. Stable mild splenomegaly. 6. No new/acute findings.     10/17/2019 Imaging   CT CAP W Contrast  IMPRESSION: 1. Overall mild progression of metastatic disease. Portacaval lymphadenopathy has increased. Enlarging lesion along the posterior right diaphragm is suspicious for growing right pleural metastasis. 2. Bulky liver metastatic disease is relatively stable with mild mixed changes as detailed. 3. Pancreatic body tumor is stable. 4. Moderate splenomegaly, increased. 5. Aortic Atherosclerosis (ICD10-I70.0). Additional chronic findings as detailed.       01/09/2020 Imaging   CT CHEST/ABD/PEL IMPRESSION 1. No specific evidence of metastatic disease within the chest. 2. New small right pleural effusion. 1. Relatively similar size of pancreatic primary. 2. Although direct comparison of liver metastasis is challenging secondary to differences in bolus timing, mild progression is identified. 3. Mild progression of abdominal adenopathy. 4. Persistent hepatosplenomegaly. 5. Trace perihepatic ascites, similar. 6. Borderline small bowel dilatation,  without focal transition point. Ileus versus low-grade partial small bowel obstruction. 7. Cholelithiasis.   01/21/2020 Imaging   CT AP  IMPRESSION: 1. No significant change of pancreatic fullness/mass, innumerable hepatic masses, abdominal lymphadenopathy and small amount of ascites compatible with metastatic disease. 2. Unchanged splenomegaly. 3. Decreased small RIGHT pleural effusion. Bibasilar atelectasis again noted. 4. Aortic Atherosclerosis (ICD10-I70.0).     03/13/2020 Imaging   CT CAP W contrast  IMPRESSION: 1. Similar size of pancreatic head/neck mass. 2. Mild interval increase in size of some of the hepatic metastasis. 3. Prominent loops of small bowel within the central and lower abdomen without discrete transition point identified. Findings may represent ileus. Additionally, there is mild wall thickening of a proximal loop of small bowel which is nonspecific. Findings may represent enteritis. 4. Wall thickening of the ascending colon and proximal transverse colon as well as descending colon and rectum raising the possibility of colitis. 5. Small volume perihepatic ascites. 6. Mildly progressed abdominal adenopathy. 7. Possible 11 mm mass within the right breast. Recommend correlation with mammography if clinically indicated given clinical condition. 8. Aortic atherosclerosis. 9. These results will be called to the ordering clinician or representative by the Radiologist Assistant, and communication documented in the PACS or [employer] Pancreatic cancer metastasized to liver (HCC) Caroline/19/2018 Initial Diagnosis   Pancreatic cancer metastasized to liver ([facility]  CURRENT THERAPY:  Supportive care  INTERVAL HISTORY:  JanicLEATTA ALEWINEere for a follow up. She presents to the clinic with her husband. She still has low appetite, weakness and fatigue. She has no pain or tenderness. Her dizziness lasted for 5 hours and vomited 3 times and that  Meniere's episode went away. She slept for hours afterward. She has had 2 of these severe episode in 3-5 weeks which is more frequent  and severe than before. She has not followed up with her ENT about this yet.  She plans to travel to [place] this weekend and then [facility] to [place] in early September.     REVIEW OF SYSTEMS:   Constitutional: Denies fevers, chills or abnormal weight loss (+) Fatigue, low appetite, weight Loss Eyes: Denies blurriness of vision Ears, nose, mouth, throat, and face: Denies mucositis or sore throat Respiratory: Denies cough, dyspnea or wheezes Cardiovascular: Denies palpitation, chest discomfort or lower extremity swelling Gastrointestinal:  Denies nausea, heartburn or change in bowel habits Skin: Denies abnormal skin rashes Lymphatics: Denies new lymphadenopathy or easy bruising Neurological:Denies numbness, tingling (+) weaknesses Behavioral/Psych: Mood is stable, no new changes  All other systems were reviewed with the patient and are negative.  MEDICAL HISTORY:  Past Medical History:  Diagnosis Date  . Diarrhea of presumed infectious origin 01/12/2018  . Gastroenteritis 01/12/2018  . Genetic testing 12/02/2017   Multi-Cancer panel (83 genes) @ [facility] - No pathogenic mutations detected  . Meniere disease 2016  . met pancreatic ca to liver dx'd 07/2017  . Pancreatitis     SURGICAL HISTORY: Past Surgical History:  Procedure Laterality Date  . CERVICAL ABLATION    . ENDOMETRIAL ABLATION  2011  . IR CHOLANGIOGRAM EXISTING TUBE  01/09/2019  . IR FLUORO GUIDE PORT INSERTION RIGHT  08/12/2017  . IR PERC CHOLECYSTOSTOMY  12/15/2018  . IR US GUIDE VASC ACCESS RIGHT  08/12/2017  . [place]    I have reviewed the social history and family history with the patient and they are unchanged from previous note.  ALLERGIES:  has No Known Allergies.  MEDICATIONS:  Current Outpatient Medications  Medication Sig Dispense Refill  . b complex vitamins  tablet Take 1 tablet by mouth daily.    . Cholecalciferol (VITAMIN D3 GUMMIES ADULT) 25 MCG (1000 UT) CHEW Chew 2,000 Units by mouth daily.     . ciprofloxacin (CILOXAN) 0.3 % ophthalmic solution Place 1 drop into both eyes every 4 (four) hours while awake. Administer 1 drop, every 2 hours, while awake, for 2 days. Then 1 drop, every 4 hours, while awake, for the next 5 days. 5 mL 0  . cyclobenzaprine (FLEXERIL) 5 MG tablet Take 1 tablet (5 mg total) by mouth 3 (three) times daily as needed for muscle spasms. 30 tablet 0  . gabapentin (NEURONTIN) 100 MG capsule Take 1 capsule (100 mg total) by mouth 3 (three) times daily. (Patient taking differently: Take 100 mg by mouth in the morning and at bedtime. ) 270 capsule 2  . lidocaine-prilocaine (EMLA) cream Apply 1 application topically as needed. 30 g 2  . lipase/protease/amylase (CREON) 36000 UNITS CPEP capsule Take 1 capsule (36,000 Units total) by mouth 3 (three) times daily with meals. May also take 1 capsule (36,000 Units total) as needed (with snacks). 90 capsule 0  . loperamide (IMODIUM) 2 MG capsule Take 1 capsule (2 mg total) by mouth as needed for diarrhea or loose stools. 30 capsule 0  . loratadine (CLARITIN) 10 MG tablet Take 10 mg by mouth daily as needed for allergies.    . magic mouthwash w/lidocaine SOLN Take 10 mLs by mouth 3 (three) times daily as needed for mouth pain. Swish and spit 10 ML by mouth 3 times daily as needed for mouth pain. 240 mL 1  . meclizine (ANTIVERT) 25 MG tablet Take 25 mg by mouth 3 (three) times daily as needed for dizziness.    Marland Kitchen  ondansetron (ZOFRAN-ODT) 8 MG disintegrating tablet TAKE 1 TABLET BY MOUTH EVERY 8 HOURS AS NEEDED FOR NAUSE OR VOMITING (Patient taking differently: Take 8 mg by mouth every 8 (eight) hours as needed for nausea or vomiting. ) 40 tablet 1  . potassium chloride (KLOR-CON) 10 MEQ tablet Take 1 tablet (10 mEq total) by mouth 4 (four) times daily. 360 tablet 2  . prochlorperazine (COMPAZINE)  10 MG tablet Take 1 tablet (10 mg total) by mouth every 6 (six) hours as needed for nausea or vomiting. 40 tablet 2  . traMADol (ULTRAM) 50 MG tablet Take 1 tablet (50 mg total) by mouth every 6 (six) hours as needed for moderate pain or severe pain. 30 tablet 0  . XARELTO 15 MG TABS tablet TAKE 1 TABLET BY MOUTH EVERY DAY 90 tablet 1  . capecitabine (XELODA) 500 MG tablet TAKE 2 TABLETS BY MOUTH IN THE MORNING AND 1 TABLET IN THE EVENING, EVERY 12 HOURS, WITHIN 30 MINUTES OF MEALS ON DAYS 1 TO 14 OF EACH 21 DAY CYCLE (Patient not taking: Reported on 04/11/2020) 42 tablet 3  . dexamethasone (DECADRON) 4 MG tablet Take 1 tablet (4 mg total) by mouth 2 (two) times daily with a meal. 30 tablet 0  . PROMACTA 50 MG tablet TAKE 1 TABLET BY MOUTH 1 TIME A DAY. (Patient not taking: Reported on 04/11/2020) 30 tablet 1   No current facility-administered medications for this visit.   Facility-Administered Medications Ordered in Other Visits  Medication Dose Route Frequency Provider Last Rate Last Admin  . sodium chloride flush (NS) 0.9 % injection 10 mL  10 mL Intracatheter Once [name], MD        PHYSICAL EXAMINATION: ECOG PERFORMANCE STATUS: 3 - Symptomatic, >50% confined to bed  Vitals:   04/11/20 1601  BP: 111/77  Pulse: 96  Resp: 18  Temp: 97.8 F (36.6 C)  SpO2: 99%   Filed Weights   04/11/20 1601  Weight: 99 lb 3.2 oz (45 kg)    GENERAL:alert, no distress and comfortable SKIN: skin color, texture, turgor are normal, no rashes or significant lesions EYES: normal, Conjunctiva are pink and non-injected, sclera clear  NECK: supple, thyroid normal size, non-tender, without nodularity LYMPH:  no palpable lymphadenopathy in the cervical, axillary  LUNGS: clear to auscultation and percussion with normal breathing effort HEART: regular rate & rhythm and no murmurs and no lower extremity edema ABDOMEN:abdomen soft, distended, mild tenderness at upper abdomen, normal bowel  sounds Musculoskeletal:no cyanosis of digits and no clubbing  NEURO: alert & oriented x 3 with fluent speech, no focal motor/sensory deficits  LABORATORY DATA:  I have reviewed the data as listed CBC Latest Ref Rng & Units 04/11/2020 03/06/2020 02/28/2020  WBC 4.0 - 10.5 K/uL 10.4 3.3(L) 6.0  Hemoglobin 12.0 - 15.0 g/dL 9.8(L) 8.1(L) 8.8(L)  Hematocrit 36 - 46 % 30.7(L) 25.0(L) 27.6(L)  Platelets 150 - 400 K/uL 258 248 358     CMP Latest Ref Rng & Units 04/11/2020 03/06/2020 02/28/2020  Glucose 70 - 99 mg/dL 113(H) 138(H) 108(H)  BUN 6 - 20 mg/dL 15 14 14   Creatinine 0.44 - 1.00 mg/dL 0.90 0.84 0.93  Sodium 135 - 145 mmol/L 134(L) 140 140  Potassium 3.5 - 5.1 mmol/L 3.3(L) 3.5 3.5  Chloride 98 - 111 mmol/L 101 105 104  CO2 22 - 32 mmol/L 23 26 25   Calcium 8.9 - 10.3 mg/dL 12.8(H) 9.8 9.2  Total Protein 6.5 - 8.1 g/dL 7.2 6.8 6.9  Total  Bilirubin 0.3 - 1.2 mg/dL 1.1 0.8 1.1  Alkaline Phos 38 - 126 U/L 387(H) 244(H) 250(H)  AST 15 - 41 U/L 70(H) 52(H) 54(H)  ALT 0 - 44 U/L 24 23 23       RADIOGRAPHIC STUDIES: I have personally reviewed the radiological images as listed and agreed with the findings in the report. No results found.   ASSESSMENT & PLAN:  Morgan Dawson is a 59 y.o. female with    1. 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 She progressed on this based on 08/2018 PET scan. -She also progressed onsecond-line Gemcitabine and Abraxanein liver and new lung nodule.  -She previously went to [facility] and [facility] for clinical trial -Her CT CAP from7/21/21showed stable pancreatic head mass and interval increase in size of liver mets. Overall she has disease progression, and I stopped third line GTX -She has recently consulted with her oncologist at [facility] and has declined  their clinical trail.  -Due to her significant neuropathy, I do not think she can tolerate oxaliplatin based therapy.  I do not have more standard treatment options for her. -I reviewed the natural history of metastatic pancreatic cancer.  Patient had many questions about the symptoms that she may experience with cancer progression, I answered all her questions to the best of my knowledge. -We had a long conversation about goals of care.  She understands the goal is palliative, and we are focusing on her quality of life. She is in complete agreement and very clear about this  -I recommend hospice, she is on board.  I explained logistics of the hospice service, and what they would not cover, such as fluids or blood transfusion, expensive medication, procedure etc. -She plans to call hospice tomorrow, and meet them after she came back from 2 trips -Labs reviewed, Hg 9.8, ANC 8.6, K 3.3, BG 113, Ca 12.8, albumin 2.8, AST 70, Alk Phos 387.  -will give zometa infusion tomorrow  -f/u after her trip in 2 weeks    2.Hypercalcemia  -this is new, calcium 12.8 today, she is asymptomatic -likely related to her underlying malignancy -I recommend Zometa infusion, hopefully will get insurance approval and give her tomorrow before her trip  3.Malnutrition, Diarrhea/constipation, Bloating -Secondaryto chemo and underlying metastatic cancer. -Continue f/u with dietician. -Outside of episodes of constipation and explosive diarrhea she mostly has loose stool 1-3 times a day. I started her on creon and her symptoms improved lately   -She also eats less due to early satiety and BMs. This has lead to weight loss and fatigue. I recommend she increase nutritional supplement, remain active at home.  -She can also try appetite stimulant with Megace or Steriods. I reviewed side effects with her. Marinol was not approved by her insurance previously. She agreed to try dexa 60m daily for 5-7 days for her upcoming trip    4.Peripheral neuropathy, grade 2-3  -Currently on Neurontin 200 mg at night and Bcomplex.She used ice bags during infusion. -Has progressed with more treatment. This is more present in her feet than her hands.  -Will further reduce dose Docetaxel dose starting with C16. -Given disease progression will d/c GTX.  5.H/o Acute cholecystitis, status post cholecystectomy tube placement 4/23/20per IR -She was seen by surgery during herhospitalization, surgery was not recommended due to high risk of complication, and concerns for cancer progression  when holding chemotherapy for surgery -Her CT CAP from 12/14/18 shows she has numerus gallstones. Imaging in 12/2018 consistent with cholecystitis with cystic duct obstruction. No leak on HIDA. -Noevidence of recurrent infectionrecently.  6. LE DVT -Diagnosed on December 15, 2018, when she was hospitalized for cholecystitis. -On low-doseXarelto4m daily   7.Goal of care discussion, DNR/DNI -The patient understands the goal of care is palliative. -IpreviouslyrecommendedDNR/DNI, sheagreed. She has living will set up.  8. Meniere's Disease/Vertigo  -she has had this for many years.  -I recommended she start OTC Meclizine and get rest, she is agreeable.  -She notes she had had 2 severe episodes in the past 3-5 weeks which is more frequent and severe than before. I recommend she f/u with her ENT about this.   9. Goal of care  -She completed agree that her care of care is palliative -She agrees with hospice referral  10.  Hemorrhoids -She is quite symptomatic from her hemorrhoids, I will refer her to colorectal surgeon to see if she is a candidate for banding   Plan -Gave Permanent handicap sticker  -Urgent referral to colorectal surgeon for her hemorrhoids treatment -zometa tomorrow for hypercalcemia, I encouraged her to drink more fluids -Lab, flush, follow-up and IV fluids in 2 weeks when she comes back from two  trips -hospice referral, she plans to meet them in about 2 weeks after her trips     No problem-specific Assessment & Plan notes found for this encounter.   Orders Placed This Encounter  Procedures  . Ambulatory referral to General Surgery    Referral Priority:   Urgent    Referral Type:   Surgical    Referral Reason:   Specialty Services Required    Requested Specialty:   General Surgery    Number of Visits Requested:   1   All questions were answered. The patient knows to call the clinic with any problems, questions or concerns. No barriers to learning was detected. The total time spent in the appointment was 40 minutes.     YTruitt Merle MD 04/11/2020   I, AJoslyn Devon am acting as scribe for YTruitt Merle MD.   I have reviewed the above documentation for accuracy and completeness, and I agree with the above.

## 2020-04-11 ENCOUNTER — Inpatient Hospital Stay: Payer: BLUE CROSS/BLUE SHIELD

## 2020-04-11 ENCOUNTER — Inpatient Hospital Stay (HOSPITAL_BASED_OUTPATIENT_CLINIC_OR_DEPARTMENT_OTHER): Payer: BLUE CROSS/BLUE SHIELD | Admitting: Hematology

## 2020-04-11 ENCOUNTER — Other Ambulatory Visit: Payer: Self-pay

## 2020-04-11 ENCOUNTER — Encounter: Payer: Self-pay | Admitting: Hematology

## 2020-04-11 VITALS — BP 111/77 | HR 96 | Temp 97.8°F | Resp 18 | Ht 65.0 in | Wt 99.2 lb

## 2020-04-11 DIAGNOSIS — C251 Malignant neoplasm of body of pancreas: Secondary | ICD-10-CM

## 2020-04-11 DIAGNOSIS — C259 Malignant neoplasm of pancreas, unspecified: Secondary | ICD-10-CM | POA: Diagnosis not present

## 2020-04-11 DIAGNOSIS — C787 Secondary malignant neoplasm of liver and intrahepatic bile duct: Secondary | ICD-10-CM

## 2020-04-11 DIAGNOSIS — Z95828 Presence of other vascular implants and grafts: Secondary | ICD-10-CM

## 2020-04-11 LAB — COMPREHENSIVE METABOLIC PANEL
ALT: 24 U/L (ref 0–44)
AST: 70 U/L — ABNORMAL HIGH (ref 15–41)
Albumin: 2.8 g/dL — ABNORMAL LOW (ref 3.5–5.0)
Alkaline Phosphatase: 387 U/L — ABNORMAL HIGH (ref 38–126)
Anion gap: 10 (ref 5–15)
BUN: 15 mg/dL (ref 6–20)
CO2: 23 mmol/L (ref 22–32)
Calcium: 12.8 mg/dL — ABNORMAL HIGH (ref 8.9–10.3)
Chloride: 101 mmol/L (ref 98–111)
Creatinine, Ser: 0.9 mg/dL (ref 0.44–1.00)
GFR calc Af Amer: >60 mL/min (ref >60)
GFR calc non Af Amer: >60 mL/min (ref >60)
Glucose, Bld: 113 mg/dL — ABNORMAL HIGH (ref 70–99)
Potassium: 3.3 mmol/L — ABNORMAL LOW (ref 3.5–5.1)
Sodium: 134 mmol/L — ABNORMAL LOW (ref 135–145)
Total Bilirubin: 1.1 mg/dL (ref 0.3–1.2)
Total Protein: 7.2 g/dL (ref 6.5–8.1)

## 2020-04-11 LAB — CBC WITH DIFFERENTIAL/PLATELET
Abs Immature Granulocytes: 0.04 10*3/uL (ref 0.00–0.07)
Basophils Absolute: 0.1 10*3/uL (ref 0.0–0.1)
Basophils Relative: 1 %
Eosinophils Absolute: 0.1 10*3/uL (ref 0.0–0.5)
Eosinophils Relative: 1 %
HCT: 30.7 % — ABNORMAL LOW (ref 36.0–46.0)
Hemoglobin: 9.8 g/dL — ABNORMAL LOW (ref 12.0–15.0)
Immature Granulocytes: 0 %
Lymphocytes Relative: 7 %
Lymphs Abs: 0.7 10*3/uL (ref 0.7–4.0)
MCH: 31.7 pg (ref 26.0–34.0)
MCHC: 31.9 g/dL (ref 30.0–36.0)
MCV: 99.4 fL (ref 80.0–100.0)
Monocytes Absolute: 0.8 10*3/uL (ref 0.1–1.0)
Monocytes Relative: 8 %
Neutro Abs: 8.6 10*3/uL — ABNORMAL HIGH (ref 1.7–7.7)
Neutrophils Relative %: 83 %
Platelets: 258 10*3/uL (ref 150–400)
RBC: 3.09 MIL/uL — ABNORMAL LOW (ref 3.87–5.11)
RDW: 16.3 % — ABNORMAL HIGH (ref 11.5–15.5)
WBC: 10.4 10*3/uL (ref 4.0–10.5)
nRBC: 0 % (ref 0.0–0.2)

## 2020-04-11 MED ORDER — SODIUM CHLORIDE 0.9% FLUSH
10.0000 mL | INTRAVENOUS | Status: DC | PRN
  Administered 2020-04-11: 10 mL
  Filled 2020-04-11: qty 10

## 2020-04-11 MED ORDER — DEXAMETHASONE 4 MG PO TABS
4.0000 mg | ORAL_TABLET | Freq: Two times a day (BID) | ORAL | 0 refills | Status: DC
Start: 2020-04-11 — End: 2020-04-30

## 2020-04-11 MED ORDER — HEPARIN SOD (PORK) LOCK FLUSH 100 UNIT/ML IV SOLN
500.0000 [IU] | Freq: Once | INTRAVENOUS | Status: AC | PRN
  Administered 2020-04-11: 500 [IU]
  Filled 2020-04-11: qty 5

## 2020-04-12 ENCOUNTER — Inpatient Hospital Stay: Payer: BLUE CROSS/BLUE SHIELD

## 2020-04-12 ENCOUNTER — Telehealth: Payer: Self-pay | Admitting: Hematology

## 2020-04-12 ENCOUNTER — Other Ambulatory Visit: Payer: Self-pay

## 2020-04-12 ENCOUNTER — Other Ambulatory Visit: Payer: Self-pay | Admitting: Hematology

## 2020-04-12 VITALS — BP 115/71 | HR 103 | Temp 98.3°F | Resp 18

## 2020-04-12 DIAGNOSIS — C787 Secondary malignant neoplasm of liver and intrahepatic bile duct: Secondary | ICD-10-CM

## 2020-04-12 DIAGNOSIS — C259 Malignant neoplasm of pancreas, unspecified: Secondary | ICD-10-CM

## 2020-04-12 DIAGNOSIS — Z95828 Presence of other vascular implants and grafts: Secondary | ICD-10-CM

## 2020-04-12 DIAGNOSIS — K642 Third degree hemorrhoids: Secondary | ICD-10-CM

## 2020-04-12 LAB — CANCER ANTIGEN 19-9: CA 19-9: 298 U/mL — ABNORMAL HIGH (ref 0–35)

## 2020-04-12 MED ORDER — SODIUM CHLORIDE 0.9 % IV SOLN
Freq: Once | INTRAVENOUS | Status: DC
  Filled 2020-04-12: qty 250

## 2020-04-12 MED ORDER — ZOLEDRONIC ACID 4 MG/100ML IV SOLN
INTRAVENOUS | Status: AC
  Filled 2020-04-12: qty 100

## 2020-04-12 MED ORDER — SODIUM CHLORIDE 0.9% FLUSH
10.0000 mL | INTRAVENOUS | Status: DC | PRN
  Administered 2020-04-12: 10 mL
  Filled 2020-04-12: qty 10

## 2020-04-12 MED ORDER — SODIUM CHLORIDE 0.9 % IV SOLN
INTRAVENOUS | Status: AC
  Filled 2020-04-12: qty 250

## 2020-04-12 MED ORDER — SODIUM CHLORIDE 0.9 % IV SOLN
INTRAVENOUS | Status: DC
  Filled 2020-04-12 (×2): qty 250

## 2020-04-12 MED ORDER — ZOLEDRONIC ACID 4 MG/100ML IV SOLN
4.0000 mg | Freq: Once | INTRAVENOUS | Status: AC
  Administered 2020-04-12: 4 mg via INTRAVENOUS

## 2020-04-12 MED ORDER — HYDROCORTISONE ACETATE 25 MG RE SUPP: 25.0000 mg | Freq: Two times a day (BID) | RECTAL | 2 refills | Status: AC

## 2020-04-12 MED ORDER — HEPARIN SOD (PORK) LOCK FLUSH 100 UNIT/ML IV SOLN
500.0000 [IU] | Freq: Once | INTRAVENOUS | Status: AC | PRN
  Administered 2020-04-12: 500 [IU]
  Filled 2020-04-12: qty 5

## 2020-04-12 NOTE — Progress Notes (Signed)
I called patient to let her know Zometa has been approved and we can infuse her this afternoon at 1:30.  She agreed to this appointment.  Also told her I had spoken with Dr. Marcello Moores (colorectal surgeon) and she states that banding is only for hemorrhoids with a lot of bleeding.  She has supposed a steroid suppository and sitz baths.  She does not have a sitz bath at home so I am going to obtain one here and give it to her when she comes in for infusion.  Also I will check with Dr. Burr Medico about prescribing the steroid suppository.

## 2020-04-12 NOTE — Telephone Encounter (Signed)
Scheduled per 8/19 los. Pt is aware of appt time and date. 

## 2020-04-12 NOTE — Patient Instructions (Signed)
Zoledronic Acid injection (Hypercalcemia, Oncology) What is this medicine? ZOLEDRONIC ACID (ZOE le dron ik AS id) lowers the amount of calcium loss from bone. It is used to treat too much calcium in your blood from cancer. It is also used to prevent complications of cancer that has spread to the bone. This medicine may be used for other purposes; ask your health care provider or pharmacist if you have questions. COMMON BRAND NAME(S): Zometa What should I tell my health care provider before I take this medicine? They need to know if you have any of these conditions:  aspirin-sensitive asthma  cancer, especially if you are receiving medicines used to treat cancer  dental disease or wear dentures  infection  kidney disease  receiving corticosteroids like dexamethasone or prednisone  an unusual or allergic reaction to zoledronic acid, other medicines, foods, dyes, or preservatives  pregnant or trying to get pregnant  breast-feeding How should I use this medicine? This medicine is for infusion into a vein. It is given by a health care professional in a hospital or clinic setting. Talk to your pediatrician regarding the use of this medicine in children. Special care may be needed. Overdosage: If you think you have taken too much of this medicine contact a poison control center or emergency room at once. NOTE: This medicine is only for you. Do not share this medicine with others. What if I miss a dose? It is important not to miss your dose. Call your doctor or health care professional if you are unable to keep an appointment. What may interact with this medicine?  certain antibiotics given by injection  NSAIDs, medicines for pain and inflammation, like ibuprofen or naproxen  some diuretics like bumetanide, furosemide  teriparatide  thalidomide This list may not describe all possible interactions. Give your health care provider a list of all the medicines, herbs, non-prescription  drugs, or dietary supplements you use. Also tell them if you smoke, drink alcohol, or use illegal drugs. Some items may interact with your medicine. What should I watch for while using this medicine? Visit your doctor or health care professional for regular checkups. It may be some time before you see the benefit from this medicine. Do not stop taking your medicine unless your doctor tells you to. Your doctor may order blood tests or other tests to see how you are doing. Women should inform their doctor if they wish to become pregnant or think they might be pregnant. There is a potential for serious side effects to an unborn child. Talk to your health care professional or pharmacist for more information. You should make sure that you get enough calcium and vitamin D while you are taking this medicine. Discuss the foods you eat and the vitamins you take with your health care professional. Some people who take this medicine have severe bone, joint, and/or muscle pain. This medicine may also increase your risk for jaw problems or a broken thigh bone. Tell your doctor right away if you have severe pain in your jaw, bones, joints, or muscles. Tell your doctor if you have any pain that does not go away or that gets worse. Tell your dentist and dental surgeon that you are taking this medicine. You should not have major dental surgery while on this medicine. See your dentist to have a dental exam and fix any dental problems before starting this medicine. Take good care of your teeth while on this medicine. Make sure you see your dentist for regular follow-up   appointments. What side effects may I notice from receiving this medicine? Side effects that you should report to your doctor or health care professional as soon as possible:  allergic reactions like skin rash, itching or hives, swelling of the face, lips, or tongue  anxiety, confusion, or depression  breathing problems  changes in vision  eye  pain  feeling faint or lightheaded, falls  jaw pain, especially after dental work  mouth sores  muscle cramps, stiffness, or weakness  redness, blistering, peeling or loosening of the skin, including inside the mouth  trouble passing urine or change in the amount of urine Side effects that usually do not require medical attention (report to your doctor or health care professional if they continue or are bothersome):  bone, joint, or muscle pain  constipation  diarrhea  fever  hair loss  irritation at site where injected  loss of appetite  nausea, vomiting  stomach upset  trouble sleeping  trouble swallowing  weak or tired This list may not describe all possible side effects. Call your doctor for medical advice about side effects. You may report side effects to FDA at 1-800-FDA-1088. Where should I keep my medicine? This drug is given in a hospital or clinic and will not be stored at home. NOTE: This sheet is a summary. It may not cover all possible information. If you have questions about this medicine, talk to your doctor, pharmacist, or health care provider.  2020 Elsevier/Gold Standard (2014-01-06 14:19:39)  

## 2020-04-12 NOTE — Progress Notes (Signed)
anus

## 2020-04-16 ENCOUNTER — Inpatient Hospital Stay (HOSPITAL_BASED_OUTPATIENT_CLINIC_OR_DEPARTMENT_OTHER): Payer: BLUE CROSS/BLUE SHIELD | Admitting: Medical

## 2020-04-16 ENCOUNTER — Other Ambulatory Visit: Payer: Self-pay

## 2020-04-16 VITALS — BP 109/79 | HR 103 | Temp 98.2°F | Resp 17 | Ht 65.0 in | Wt 101.1 lb

## 2020-04-16 DIAGNOSIS — L89151 Pressure ulcer of sacral region, stage 1: Secondary | ICD-10-CM

## 2020-04-16 DIAGNOSIS — C787 Secondary malignant neoplasm of liver and intrahepatic bile duct: Secondary | ICD-10-CM

## 2020-04-16 DIAGNOSIS — K649 Unspecified hemorrhoids: Secondary | ICD-10-CM

## 2020-04-16 DIAGNOSIS — C259 Malignant neoplasm of pancreas, unspecified: Secondary | ICD-10-CM

## 2020-04-16 MED ORDER — LIDOCAINE VISCOUS HCL 2 % MT SOLN: OROMUCOSAL | 1 refills | Status: AC

## 2020-04-16 MED ORDER — HYDROCORTISONE (PERIANAL) 2.5 % EX CREA: 1.0000 "application " | TOPICAL_CREAM | Freq: Two times a day (BID) | CUTANEOUS | 1 refills | Status: AC

## 2020-04-16 NOTE — Progress Notes (Signed)
Patient calls with complaint of increasing discomfort with hemorrhoids, very painful, finding it difficult to walk, small amount of bleeding intermittently.  I have spoken with Sandi Mealy PA-C and have given her an appointment at 1:00 today to be evaluated.

## 2020-04-19 NOTE — Progress Notes (Signed)
[facility] Progress Note   Morgan Dawson [id number] Aug 11, 1961 59 y.o.  Morgan Dawson is managed by Dr. Truitt Dawson  Actively treated with chemotherapy/immunotherapy/hormonal therapy: no  Next scheduled appointment with provider: 04/26/2020  Assessment: Plan:    Hemorrhoids, unspecified hemorrhoid type - Plan: hydrocortisone (ANUSOL-HC) 2.5 % rectal cream, lidocaine (XYLOCAINE) 2 % solution  Pancreatic cancer metastasized to liver [facility])  Liver metastases - presumed pancreatic cancer primary  Pressure injury of sacral region, stage 1   Metastatic pancreatic cancer: Morgan Dawson has elected to discontinue treatment.  She will follow-up as needed.  Hemorrhoids: Patient was given a prescription for Anusol HC cream and viscous lidocaine to use as needed.  She was also told to use her donut pillow.  Stage I pressure injury of the sacral area: She was given samples of DuoDERM to use and was told to continue using her donut pillow.  She was also told to get a pair of bicycle shorts which contain silicon padding to wear.  Please see After Visit Summary for patient specific instructions.  Future Appointments  Date Time Provider [place]  04/26/2020  9:45 AM [facility] None  04/26/2020 10:00 AM [facility] [place] FLUSH [facility] None  04/26/2020 10:20 AM Morgan Merle, MD [facility] None  04/26/2020 11:30 AM [facility] INFUSION [facility] None    No orders of the defined types were placed in this encounter.      Subjective:   Patient ID:  Morgan Dawson is a 59 y.o. (DOB Nov 25, 1960) female.  Chief Complaint: No chief complaint on file.   HPI Morgan Dawson is a 59 y.o. female with a diagnosis of metastatic pancreatic cancer.  She is followed by Dr. Truitt Dawson.  She has elected to discontinue therapy at this time.  She presents to the clinic today with a report of pain in her rectal area secondary to hemorrhoids.  She also reports that she has  areas of redness over her bilateral buttocks.  She has been using Anusol suppositories and Preparation H for hemorrhoids without benefit thus far.  She also has been using a donut pillow to decrease the pressure to her buttocks.  She will be leaving tomorrow to fly to [place] with her husband to visit friends.  Medications: I have reviewed the patient's current medications.  Allergies: No Known Allergies  Past Medical History:  Diagnosis Date  . Diarrhea of presumed infectious origin 01/12/2018  . Gastroenteritis 01/12/2018  . Genetic testing 12/02/2017   Multi-Cancer panel (83 genes) @ [facility] - No pathogenic mutations detected  . Meniere disease 2016  . met pancreatic ca to liver dx'd 07/2017  . Pancreatitis     Past Surgical History:  Procedure Laterality Date  . CERVICAL ABLATION    . ENDOMETRIAL ABLATION  2011  . IR CHOLANGIOGRAM EXISTING TUBE  01/09/2019  . IR FLUORO GUIDE PORT INSERTION RIGHT  08/12/2017  . IR PERC CHOLECYSTOSTOMY  12/15/2018  . IR US GUIDE VASC ACCESS RIGHT  08/12/2017  . MANDIBLE SURGERY  1999    Family History  Problem Relation Age of Onset  . Colon cancer Father 79       currently 33  . Colon cancer Maternal Grandfather   . Breast cancer Cousin   . Thyroid cancer Mother 69       facial radiation for acne as teen  . Lymphoma Paternal Aunt 36       deceased 5  . Breast cancer Paternal Aunt  Social History   Socioeconomic History  . Marital status: Married    Spouse name: Not on file  . Number of children: Not on file  . Years of education: Not on file  . Highest education level: Not on file  Occupational History  . Not on file  Tobacco Use  . Smoking status: Never Smoker  . Smokeless tobacco: Never Used  Vaping Use  . Vaping Use: Never used  Substance and Sexual Activity  . Alcohol use: No  . Drug use: No  . Sexual activity: Not Currently  Other Topics Concern  . Not on file  Social History Narrative  . Not on file   Social  Determinants of Health   Financial Resource Strain:   . Difficulty of Paying Living Expenses: Not on file  Food Insecurity:   . Worried About Charity fundraiser in the Last Year: Not on file  . Ran Out of Food in the Last Year: Not on file  Transportation Needs:   . Lack of Transportation (Medical): Not on file  . Lack of Transportation (Non-Medical): Not on file  Physical Activity:   . Days of Exercise per Week: Not on file  . Minutes of Exercise per Session: Not on file  Stress:   . Feeling of Stress : Not on file  Social Connections:   . Frequency of Communication with Friends and Family: Not on file  . Frequency of Social Gatherings with Friends and Family: Not on file  . Attends Religious Services: Not on file  . Active Member of Clubs or Organizations: Not on file  . Attends Archivist Meetings: Not on file  . Marital Status: Not on file  Intimate Partner Violence:   . Fear of Current or Ex-Partner: Not on file  . Emotionally Abused: Not on file  . Physically Abused: Not on file  . Sexually Abused: Not on file    Past Medical History, Surgical history, Social history, and Family history were reviewed and updated as appropriate.   Please see review of systems for further details on the patient's review from today.   Review of Systems:  Review of Systems  Constitutional: Negative for chills, diaphoresis and fever.  Gastrointestinal: Positive for rectal pain.       Hemorrhoids  Skin: Positive for color change.       Erythema of the bilateral medial buttocks.    Objective:   Physical Exam:  BP 109/79 (BP Location: Left Arm, Patient Position: Sitting)   Pulse (!) 103   Temp 98.2 F (36.8 C) (Oral)   Resp 17   Ht 5' 5"  (1.651 m)   Wt 45.9 kg (101 lb 1.6 oz)   LMP 10/23/2015   SpO2 100%   BMI 16.82 kg/m  ECOG: 1  Physical Exam Constitutional:      General: She is not in acute distress.    Appearance: Normal appearance. She is not toxic-appearing.   Genitourinary:    Comments: Thrombosed hemorrhoids Skin:    Findings: Erythema present.     Comments: Erythema of the bilateral medial buttocks.  Neurological:     Mental Status: She is alert.     Coordination: Coordination normal.     Gait: Gait normal.  Psychiatric:        Mood and Affect: Mood normal.        Behavior: Behavior normal.        Thought Content: Thought content normal.        Judgment:  Judgment normal.    Valda Favia, RN was present for the entire exam.  Lab Review:     Component Value Date/Time   NA 134 (L) 04/11/2020 1607   NA 134 (L) 08/23/2017 0951   K 3.3 (L) 04/11/2020 1607   K 2.5 Repeated and Verified (LL) 08/23/2017 0951   CL 101 04/11/2020 1607   CO2 23 04/11/2020 1607   CO2 26 08/23/2017 0951   GLUCOSE 113 (H) 04/11/2020 1607   GLUCOSE 116 08/23/2017 0951   BUN 15 04/11/2020 1607   BUN 11.6 08/23/2017 0951   CREATININE 0.90 04/11/2020 1607   CREATININE 1.20 (H) 01/27/2019 1501   CREATININE 1.0 08/23/2017 0951   CALCIUM 12.8 (H) 04/11/2020 1607   CALCIUM 10.4 08/23/2017 0951   PROT 7.2 04/11/2020 1607   PROT 6.4 08/23/2017 0951   ALBUMIN 2.8 (L) 04/11/2020 1607   ALBUMIN 2.9 (L) 08/23/2017 0951   AST 70 (H) 04/11/2020 1607   AST 47 (H) 01/27/2019 1501   AST 208 (HH) 08/23/2017 0951   ALT 24 04/11/2020 1607   ALT 46 (H) 01/27/2019 1501   ALT 151 (H) 08/23/2017 0951   ALKPHOS 387 (H) 04/11/2020 1607   ALKPHOS 549 (H) 08/23/2017 0951   BILITOT 1.1 04/11/2020 1607   BILITOT 0.6 01/27/2019 1501   BILITOT 0.82 08/23/2017 0951   GFRNONAA >60 04/11/2020 1607   GFRNONAA 50 (L) 01/27/2019 1501   GFRAA >60 04/11/2020 1607   GFRAA 59 (L) 01/27/2019 1501       Component Value Date/Time   WBC 10.4 04/11/2020 1538   RBC 3.09 (L) 04/11/2020 1538   HGB 9.8 (L) 04/11/2020 1538   HGB 9.9 (L) 01/27/2019 1501   HGB 11.9 08/23/2017 0951   HCT 30.7 (L) 04/11/2020 1538   HCT 36.0 08/23/2017 0951   PLT 258 04/11/2020 1538   PLT 153 01/27/2019  1501   PLT 401 (H) 08/23/2017 0951   MCV 99.4 04/11/2020 1538   MCV 92.8 08/23/2017 0951   MCH 31.7 04/11/2020 1538   MCHC 31.9 04/11/2020 1538   RDW 16.3 (H) 04/11/2020 1538   RDW 14.2 08/23/2017 0951   LYMPHSABS 0.7 04/11/2020 1538   LYMPHSABS 0.8 (L) 08/23/2017 0951   MONOABS 0.8 04/11/2020 1538   MONOABS 1.3 (H) 08/23/2017 0951   EOSABS 0.1 04/11/2020 1538   EOSABS 0.1 08/23/2017 0951   BASOSABS 0.1 04/11/2020 1538   BASOSABS 0.0 08/23/2017 0951   -------------------------------  Imaging from last 24 hours (if applicable):  Radiology interpretation: DG Abd 1 View  Result Date: 03/28/2020 CLINICAL DATA:  Metastatic pancreatic cancer with abdominal distention. Pain and constipation. EXAM: ABDOMEN - 1 VIEW COMPARISON:  CT 03/13/2020. FINDINGS: Soft tissues of the abdomen are unremarkable. Stool noted throughout the colon. No bowel distention or free air. Punctate calcific densities noted left upper quadrant, possibly pill fragments. Mild lumbar spine scoliosis concave right. Degenerative changes lumbar spine and both hips. IMPRESSION: No acute abnormality identified. No bowel distention or free air. Stool noted throughout the colon. Constipation could present in this fashion. Electronically Signed   By: Marcello Moores  Register   On: 03/28/2020 10:36

## 2020-04-24 ENCOUNTER — Other Ambulatory Visit: Payer: Self-pay

## 2020-04-24 ENCOUNTER — Telehealth: Payer: Self-pay | Admitting: Nurse Practitioner

## 2020-04-24 DIAGNOSIS — C787 Secondary malignant neoplasm of liver and intrahepatic bile duct: Secondary | ICD-10-CM

## 2020-04-24 NOTE — Telephone Encounter (Signed)
Phone call placed to patient to offer to schedule a visit with [facility]. Phone rang, with no answer I left a voicemail for call back. 

## 2020-04-24 NOTE — Progress Notes (Signed)
[place]   [phone] [phone]) [phone]   Clinic Follow up Note   Patient Care Team: [name], MD as PCP - General (Family Medicine) [name], MD as Consulting Physician (Otolaryngology) [name], MD as Consulting Physician (Oncology)  Date of Service:  04/26/2020  CHIEF COMPLAINT:  F/u of pancreatic cancer  SUMMARY OF ONCOLOGIC HISTORY: Oncology History Overview Note  Cancer Staging Pancreatic cancer [facility]) Staging form: Exocrine Pancreas, AJCC 8th Edition - Clinical stage from 08/06/2017: Stage IV (cTX, cN0, pM1) - Signed by [name], MD on 08/12/2017     Pancreatic cancer ([place])  07/23/2017 Imaging   US abdomen limited RUQ 07/23/17 IMPRESSION: 1. Cholelithiasis.  No secondary signs of acute cholecystitis. 2. Multiple solid liver masses measuring up to 6.5 cm in the left lobe of the liver, evaluation for metastatic disease is recommended. These results will be called to the ordering clinician or representative by the Radiologist Assistant, and communication documented in the PACS or zVision Dashboard.   07/23/2017 Imaging   CT Abdomen W Contrast 07/23/17 IMPRESSION: 1. Widespread metastatic disease throughout the liver. No clear primary malignancy identified in the abdomen. The pelvis was not imaged. Tissue sampling recommended. 2. Probable adenopathy superior to the pancreatic tail. No evidence of pancreatic mass. 3. Suspected incidental hemangioma inferiorly in the right hepatic lobe. 4. Nonspecific nodularity in the breasts. The patient has undergone recent (03/25/2017 and 04/01/2017) mammography and ultrasound.   07/29/2017 Initial Diagnosis   Metastasis to liver of unknown origin ([place])   07/29/2017 PET scan   PET 07/29/17  IMPRESSION: 1. Numerous bulky liver masses are hypermetabolic compatible with malignancy. 2. Accentuated activity within or along the pancreatic tail, likely represent a primary pancreatic tumor.  Consider pancreatic protocol MRI to further work this up. 3. The peripancreatic lymph node shown above the pancreatic tail is mildly hypermetabolic favoring malignancy. 4.  Prominent stool throughout the colon favors constipation. 5. 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   08/05/2017 Pathology Results   Liver Biopsy  Diagnosis 08/05/17 Liver, needle/core biopsy - CARCINOMA. - SEE COMMENT. Microscopic Comment The malignant cells are positive for cytokeratin 7. They are negative for arginase, CDX2, cytokeratin 20, estrogen receptor, GATA-3, GCDFP, Glypican 3, Hep Par 1, Napsin A, and TTF-1. This immunohistochemical is nonspecific. Possibly primary sources include pancreatobiliary and upper gastrointestinal. Radiologic correlation is necessary. Of note, organ specific markers (GATA-3, GCDFP-breast, TTF-1, Napsin A-lung, and CDX2-colon) are negative. (JBK:ecj 08/09/2017)   08/21/2017 - 02/10/2018 Chemotherapy   FOLFIRINOX every 2 weeks starting 08/21/17. Dose reduced due to neuropahty and cytopenia    10/14/2017 Imaging   CT CAP WO Contrast 10/14/17 IMPRESSION: Evidence of known numerous liver metastases without significant interval change. No other evidence of metastatic disease within the chest, abdomen or pelvis. Cholelithiasis. Tiny pericardial effusion.   12/02/2017 Genetic Testing   Negative for pathogenic mutation.  The genes analyzed were the 83 genes on [facility]'s Multi-Cancer panel (ALK, APC, ATM, AXIN2, BAP1, BARD1, BLM, BMPR1A, BRCA1, BRCA2, BRIP1, CASR, CDC73, CDH1, CDK4, CDKN1B, CDKN1C, CDKN2A, CEBPA, CHEK2, CTNNA1, DICER1, DIS3L2, EGFR, EPCAM, FH, FLCN, GATA2, GPC3, GREM1, HOXB13, HRAS, KIT, MAX, MEN1, MET, MITF, MLH1, MSH2, MSH3, MSH6, MUTYH, NBN, NF1, NF2, NTHL1, PALB2, PDGFRA, PHOX2B, PMS2, POLD1, POLE, POT1, PRKAR1A, PTCH1, PTEN, RAD50, RAD51C, RAD51D, RB1, RECQL4, RET, RUNX1, SDHA, SDHAF2, SDHB, SDHC, SDHD, SMAD4, SMARCA4, SMARCB1, SMARCE1, STK11, SUFU, TERC, TERT,  TMEM127, TP53, TSC1, TSC2, VHL, WRN, WT1).   12/17/2017 PET scan   IMPRESSION: 1. Although the liver  metastases are still visible on the CT images, they are significantly smaller and retain no abnormal metabolic activity, consistent with response to therapy. 2. No abnormal activity within the pancreas. 3. No disease progression identified. 4. Cholelithiasis.   02/21/2018 PET scan   02/21/2018 PET Scan  IMPRESSION: 1. There are 2 new small nodules identified within the right lung which measure up to 5 mm. These are too small to reliably characterize by PET-CT, but warrant close interval follow-up. 2. Again noted are multifocal liver metastasis. The target lesion within the left lobe is slightly decreased in size when compared with the previous exam. Similar to previous exam there is no abnormal hypermetabolism above background liver activity identified within the liver lesions. 3. Gallstones.   02/23/2018 - 09/04/2018 Chemotherapy   5-FU pump infusion and liposomal irinotecan, every 2 weeks.  First cycle dose reduced due to cytopenia in the travel. Stopped on 08/25/2018 due to disease progression.    05/17/2018 Imaging   05/17/2018 PET Scan IMPRESSION: 1. Mixed response. The 2 small right lung nodules have decreased in size in the interval. Single focus of increased uptake within the liver is new from 02/21/2018 and concerning for recurrent metabolically active liver metastasis. 2. Splenomegaly.  New from previous exam. 3. Diffusely increased bone marrow activity, likely reflecting treatment related changes.   09/01/2018 PET scan   PET 09/01/18 IMPRESSION: 1. Unfortunately there recurrence of hepatic metastasis with multiple new hypermetabolic lesions in LEFT and RIGHT hepatic lobe. 2. New extra hepatic site of malignancy which appears associated the mid pancreas. Difficult to define lesion on noncontrast exam. 3. Diffuse marrow activity is favored benign. 4. No evidence of pulmonary  metastasis.   09/12/2018 - 03/27/2019 Chemotherapy   second-line Gemcitabine and Abraxane for 2 weeks on, 1 week off starting 09/12/2018. Due to neuropathy, I will start her with dose reduced Abraxane. Stopped after 03/27/19 due to disease progression.    12/14/2018 Imaging   CT CAP  IMPRESSION: CT demonstrates acute cholecystitis, with choledocholithiasis of the cystic duct.  These results were discussed by telephone at the time of interpretation on 12/14/2018 at 5:05 pm with Dr. Burr Medico.  Redemonstration of known liver metastases, with positive response to therapy, interval reduction in size of all lesions, with multiple demonstrating significant internal necrosis.  No acute finding of the chest.  Ancillary findings as above.   03/21/2019 Imaging   CT CAP W Contrast IMPRESSION: 1. Numerous rim enhancing liver metastases show overall trend towards mild progression although 1 of the index lesions is stable in the interval. 2. Interval development of portocaval lymphadenopathy concerning for disease progression. 3. Slight progression of main duct dilatation in the pancreas with abrupt cut off in the region of the body. 4. Interval development of a 9 mm subpleural nodule at the right base along the posterior hemidiaphragm. Close attention on follow-up recommended as metastatic disease not excluded. 5. New splenomegaly. 6. Cholelithiasis with diffuse gallbladder wall thickening. Gallbladder wall thickening is decreased in the interval. Stones and sludge noted in the gallbladder lumen.   04/09/2019 Imaging   CT AP W Contrast 04/09/19  IMPRESSION: 1. Multiple gallstones in lumen gallbladder without evidence acute cholecystitis. No significant change from prior. No biliary duct dilatation. Common bile duct normal. 2. Widespread hepatic metastasis unchanged from comparison CT. 3. Chronic pancreatic duct dilatation in the body and tail unchanged. 4. No bowel obstruction. 5. Small  amount of intraperitoneal free fluid unchanged.   04/10/2019 - 03/15/2020 Chemotherapy   Third-line GTX with  Xeloda 1042m BID day 1-14, off for 1 week with Docetaxel 340mm2 and Gemcitabine 75068m2 on day 4 and day 11 every 3 weeks starting oral chemo on 04/10/19. Starting 07/06/19 she will reduce Xeloda to 1000m14m the AM and 500mg74mthe PM due to low blood counts. D/c 03/15/20 due to disease progression in liver. Last dose taken 03/09/20.   07/03/2019 Imaging   CT CAP W Contrast  IMPRESSION: 1. Overall stable diffuse hepatic metastatic disease. No new/progressive findings. 2. Stable to slightly larger periportal lymph node. 3. Stable CT appearance of the pancreas. Abrupt cut off of a dilated main pancreatic duct in the body/head junction region without obvious pancreatic lesion. 4. No pulmonary nodules are identified at the lung bases. 5. Stable mild splenomegaly. 6. No new/acute findings.     10/17/2019 Imaging   CT CAP W Contrast  IMPRESSION: 1. Overall mild progression of metastatic disease. Portacaval lymphadenopathy has increased. Enlarging lesion along the posterior right diaphragm is suspicious for growing right pleural metastasis. 2. Bulky liver metastatic disease is relatively stable with mild mixed changes as detailed. 3. Pancreatic body tumor is stable. 4. Moderate splenomegaly, increased. 5. Aortic Atherosclerosis (ICD10-I70.0). Additional chronic findings as detailed.       01/09/2020 Imaging   CT CHEST/ABD/PEL IMPRESSION 1. No specific evidence of metastatic disease within the chest. 2. New small right pleural effusion. 1. Relatively similar size of pancreatic primary. 2. Although direct comparison of liver metastasis is challenging secondary to differences in bolus timing, mild progression is identified. 3. Mild progression of abdominal adenopathy. 4. Persistent hepatosplenomegaly. 5. Trace perihepatic ascites, similar. 6. Borderline small bowel dilatation,  without focal transition point. Ileus versus low-grade partial small bowel obstruction. 7. Cholelithiasis.   01/21/2020 Imaging   CT AP  IMPRESSION: 1. No significant change of pancreatic fullness/mass, innumerable hepatic masses, abdominal lymphadenopathy and small amount of ascites compatible with metastatic disease. 2. Unchanged splenomegaly. 3. Decreased small RIGHT pleural effusion. Bibasilar atelectasis again noted. 4. Aortic Atherosclerosis (ICD10-I70.0).     03/13/2020 Imaging   CT CAP W contrast  IMPRESSION: 1. Similar size of pancreatic head/neck mass. 2. Mild interval increase in size of some of the hepatic metastasis. 3. Prominent loops of small bowel within the central and lower abdomen without discrete transition point identified. Findings may represent ileus. Additionally, there is mild wall thickening of a proximal loop of small bowel which is nonspecific. Findings may represent enteritis. 4. Wall thickening of the ascending colon and proximal transverse colon as well as descending colon and rectum raising the possibility of colitis. 5. Small volume perihepatic ascites. 6. Mildly progressed abdominal adenopathy. 7. Possible 11 mm mass within the right breast. Recommend correlation with mammography if clinically indicated given clinical condition. 8. Aortic atherosclerosis. 9. These results will be called to the ordering clinician or representative by the Radiologist Assistant, and communication documented in the PACS or [employer] Pancreatic cancer metastasized to liver (HCC)[facility]/19/2018 Initial Diagnosis   Pancreatic cancer metastasized to liver (HCC)[facility]  CURRENT THERAPY:  Supportive and Palliative care  INTERVAL HISTORY:  Morgan Dawson for a follow up. She presents to the clinic with her husband. She notes her energy continues to reduce. She uses her hiking pole to get up and down stairs. She notes she consulted with Hospice  home care. She notes she has wanted to postpone starting so she can stay under my care. I reviewed her medication list with  her. She notes ankle swelling. She has been wearing compression socks. She notes she plans for 3 more short trips in September. She continues to think about hospice.    REVIEW OF SYSTEMS:   Constitutional: Denies fevers, chills or abnormal weight loss Eyes: Denies blurriness of vision Ears, nose, mouth, throat, and face: Denies mucositis or sore throat Respiratory: Denies cough, dyspnea or wheezes Cardiovascular: Denies palpitation, chest discomfort (+) b/l lower extremity swelling Gastrointestinal:  Denies nausea, heartburn or change in bowel habits Skin: Denies abnormal skin rashes Lymphatics: Denies new lymphadenopathy or easy bruising Neurological:Denies numbness, tingling or new weaknesses Behavioral/Psych: Mood is stable, no new changes  All other systems were reviewed with the patient and are negative.  MEDICAL HISTORY:  Past Medical History:  Diagnosis Date  . Diarrhea of presumed infectious origin 01/12/2018  . Gastroenteritis 01/12/2018  . Genetic testing 12/02/2017   Multi-Cancer panel (83 genes) @ [facility] - No pathogenic mutations detected  . Meniere disease 2016  . met pancreatic ca to liver dx'd 07/2017  . Pancreatitis     SURGICAL HISTORY: Past Surgical History:  Procedure Laterality Date  . CERVICAL ABLATION    . ENDOMETRIAL ABLATION  2011  . IR CHOLANGIOGRAM EXISTING TUBE  01/09/2019  . IR FLUORO GUIDE PORT INSERTION RIGHT  08/12/2017  . IR PERC CHOLECYSTOSTOMY  12/15/2018  . IR US GUIDE VASC ACCESS RIGHT  08/12/2017  . [place]    I have reviewed the social history and family history with the patient and they are unchanged from previous note.  ALLERGIES:  has No Known Allergies.  MEDICATIONS:  Current Outpatient Medications  Medication Sig Dispense Refill  . b complex vitamins tablet Take 1 tablet by mouth daily.    .  Cholecalciferol (VITAMIN D3 GUMMIES ADULT) 25 MCG (1000 UT) CHEW Chew 2,000 Units by mouth daily.     . ciprofloxacin (CILOXAN) 0.3 % ophthalmic solution Place 1 drop into both eyes every 4 (four) hours while awake. Administer 1 drop, every 2 hours, while awake, for 2 days. Then 1 drop, every 4 hours, while awake, for the next 5 days. 5 mL 0  . cyclobenzaprine (FLEXERIL) 5 MG tablet Take 1 tablet (5 mg total) by mouth 3 (three) times daily as needed for muscle spasms. 30 tablet 0  . dexamethasone (DECADRON) 4 MG tablet Take 1 tablet (4 mg total) by mouth 2 (two) times daily with a meal. 30 tablet 0  . gabapentin (NEURONTIN) 100 MG capsule Take 1 capsule (100 mg total) by mouth 3 (three) times daily. (Patient taking differently: Take 100 mg by mouth in the morning and at bedtime. ) 270 capsule 2  . hydrocortisone (ANUSOL-HC) 2.5 % rectal cream Place 1 application rectally 2 (two) times daily. 60 g 1  . hydrocortisone (ANUSOL-HC) 25 MG suppository Place 1 suppository (25 mg total) rectally 2 (two) times daily. 12 suppository 2  . lidocaine (XYLOCAINE) 2 % solution Apply small amount to rectum PRN pain 100 mL 1  . lidocaine-prilocaine (EMLA) cream Apply 1 application topically as needed. 30 g 2  . lipase/protease/amylase (CREON) 36000 UNITS CPEP capsule Take 1 capsule (36,000 Units total) by mouth 3 (three) times daily with meals AND 2 capsules (72,000 Units total) 3 (three) times daily with meals. Take 1 capsule with snack. 180 capsule 0  . loperamide (IMODIUM) 2 MG capsule Take 1 capsule (2 mg total) by mouth as needed for diarrhea or loose stools. 30 capsule 0  . loratadine (CLARITIN)  10 MG tablet Take 10 mg by mouth daily as needed for allergies.    . magic mouthwash w/lidocaine SOLN Take 10 mLs by mouth 3 (three) times daily as needed for mouth pain. Swish and spit 10 ML by mouth 3 times daily as needed for mouth pain. 240 mL 1  . meclizine (ANTIVERT) 25 MG tablet Take 25 mg by mouth 3 (three) times  daily as needed for dizziness.    . ondansetron (ZOFRAN-ODT) 8 MG disintegrating tablet TAKE 1 TABLET BY MOUTH EVERY 8 HOURS AS NEEDED FOR NAUSE OR VOMITING (Patient taking differently: Take 8 mg by mouth every 8 (eight) hours as needed for nausea or vomiting. ) 40 tablet 1  . potassium chloride (KLOR-CON) 10 MEQ tablet Take 1 tablet (10 mEq total) by mouth 4 (four) times daily. 360 tablet 2  . prochlorperazine (COMPAZINE) 10 MG tablet Take 1 tablet (10 mg total) by mouth every 6 (six) hours as needed for nausea or vomiting. 40 tablet 2  . PROMACTA 50 MG tablet TAKE 1 TABLET BY MOUTH 1 TIME A DAY. (Patient not taking: Reported on 04/11/2020) 30 tablet 1  . traMADol (ULTRAM) 50 MG tablet Take 1 tablet (50 mg total) by mouth every 6 (six) hours as needed for moderate pain or severe pain. 30 tablet 0  . XARELTO 15 MG TABS tablet TAKE 1 TABLET BY MOUTH EVERY DAY 90 tablet 1   Current Facility-Administered Medications  Medication Dose Route Frequency Provider Last Rate Last Admin  . heparin lock flush 100 unit/mL  500 Units Intracatheter Once [name], MD      . sodium chloride flush (NS) 0.9 % injection 10 mL  10 mL Intracatheter Once [name], MD       Facility-Administered Medications Ordered in Other Visits  Medication Dose Route Frequency Provider Last Rate Last Admin  . sodium chloride flush (NS) 0.9 % injection 10 mL  10 mL Intracatheter Once [name], MD        PHYSICAL EXAMINATION: ECOG PERFORMANCE STATUS: 3 - Symptomatic, >50% confined to bed  Vitals:   04/26/20 1037  BP: 113/80  Pulse: 96  Resp: 18  Temp: 97.6 F (36.4 C)  SpO2: 99%   Filed Weights   04/26/20 1037  Weight: 102 lb 4.8 oz (46.4 kg)    Due to COVID19 we will limit examination to appearance. Patient had no complaints.  GENERAL:alert, no distress and comfortable SKIN: skin color normal, no rashes or significant lesions EYES: normal, Conjunctiva are pink and non-injected, sclera clear  NEURO: alert & oriented  x 3 with fluent speech   LABORATORY DATA:  I have reviewed the data as listed CBC Latest Ref Rng & Units 04/26/2020 04/11/2020 03/06/2020  WBC 4.0 - 10.5 K/uL 13.8(H) 10.4 3.3(L)  Hemoglobin 12.0 - 15.0 g/dL 10.6(L) 9.8(L) 8.1(L)  Hematocrit 36 - 46 % 33.0(L) 30.7(L) 25.0(L)  Platelets 150 - 400 K/uL 226 258 248     CMP Latest Ref Rng & Units 04/26/2020 04/11/2020 03/06/2020  Glucose 70 - 99 mg/dL 154(H) 113(H) 138(H)  BUN 6 - 20 mg/dL 21(H) 15 14  Creatinine 0.44 - 1.00 mg/dL 0.83 0.90 0.84  Sodium 135 - 145 mmol/L 137 134(L) 140  Potassium 3.5 - 5.1 mmol/L 3.3(L) 3.3(L) 3.5  Chloride 98 - 111 mmol/L 104 101 105  CO2 22 - 32 mmol/L 24 23 26   Calcium 8.9 - 10.3 mg/dL 8.8(L) 12.8(H) 9.8  Total Protein 6.5 - 8.1 g/dL 6.7 7.2 6.8  Total  Bilirubin 0.3 - 1.2 mg/dL 2.0(H) 1.1 0.8  Alkaline Phos 38 - 126 U/L 415(H) 387(H) 244(H)  AST 15 - 41 U/L 74(H) 70(H) 52(H)  ALT 0 - 44 U/L 36 24 23      RADIOGRAPHIC STUDIES: I have personally reviewed the radiological images as listed and agreed with the findings in the report. No results found.   ASSESSMENT & PLAN:  CAELAN ATCHLEY is a 59 y.o. female with    1. 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 She then progressed on second and third line treatments.  -She previously declined clinical trial at [facility].  -Due to her significant neuropathy, I do not think she can tolerate oxaliplatin based therapy.I do not have more standard treatment options for her. We previously had long discussion about her aggressive cancer, poor prognosis and goal of care.  -She is currently under palliative care. She has consulted with hospice care. We reviewed both palliative and hospice resources and care. As her disease progresses and she further declines, she will not be able to come to our  clinic for supportive care and I encourage her to start hospice care. I discussed that the goal of care now is comfort. She voiced good understanding and will continue to think about it. -Virtual visit in 2 weeks   2. Supportive Care: Malnutrition, Diarrhea/constipation, Bloating, Peripheral neuropathy, Hemorrhoids -Secondaryto chemo and underlying metastatic cancer. -Today she presents with further decline.  -On Creon, nutritional supplements and Neurontin 200 mg at night. She tried dexa 12m for 2-3 weeks. She notes this has not helped much. She can taper off with 188mfor 1 week and then half tablet for 1 week before stopping. -She has ankle swelling due to low protein/albumin. I recommend she use tighter compression socks and elevate her legs. She can also use protein powder or shakes, increase water intake and ambulate more.  -For pain she is on Tramadol currently.  -Labs reviewed, WBC 13.8, Hg 10.6, ANC 12.3, K 3.3, albumin decreased to 2.4, AST 74, Alk Phos 415, tbili 2. She can take oral potassium as much as she can tolerate. She may develop jaundice as her cancer progresses. She can watch for this.   3.Hypercalcemia  -New onset on 04/11/20 labs with calcium 12.8, she is asymptomatic -likely related to her underlying malignancy -To lower her hypercalcemia, I gave her Zometa on 04/12/20.  -Calcemia improved to 8.8 today (04/26/20), due to her recent zometa   4. Meniere's Disease/Vertigo  -She has had this for many years.  -I recommended she start OTC Meclizine and get rest, she is agreeable.  5. LE DVT -Diagnosed on December 15, 2018, when she was hospitalized for cholecystitis. -On low-doseXarelto1518maily  6.Goal of care discussion, DNR/DNI -The patient understands the goal of care is palliative. -IpreviouslyrecommendedDNR/DNI, sheagreed. She has living will set up. -She is currently off treatment and under palliative care  7.H/o Acute cholecystitis, status post  cholecystectomy tube placement 4/23/20per IR, no surgery offered.    Plan -I refilled Creon today  -Taper off Dexamethasone  -Virtual Visit in 2 weeks -she will continue home palliative care, will likely change to hospice at some point    No problem-specific Assessment & Plan notes found for this encounter.   No orders of the defined types were placed in this encounter.  All questions were answered. The patient  knows to call the clinic with any problems, questions or concerns. No barriers to learning was detected. The total time spent in the appointment was 30 minutes.     [name], MD 04/26/2020   I, Joslyn Devon, am acting as scribe for [name], MD.   I have reviewed the above documentation for accuracy and completeness, and I agree with the above.

## 2020-04-26 ENCOUNTER — Inpatient Hospital Stay: Payer: BLUE CROSS/BLUE SHIELD

## 2020-04-26 ENCOUNTER — Inpatient Hospital Stay: Payer: BLUE CROSS/BLUE SHIELD | Attending: Hematology | Admitting: Hematology

## 2020-04-26 ENCOUNTER — Other Ambulatory Visit: Payer: Self-pay

## 2020-04-26 ENCOUNTER — Other Ambulatory Visit: Payer: Self-pay | Admitting: Hematology

## 2020-04-26 VITALS — BP 113/80 | HR 96 | Temp 97.6°F | Resp 18 | Ht 65.0 in | Wt 102.3 lb

## 2020-04-26 DIAGNOSIS — E46 Unspecified protein-calorie malnutrition: Secondary | ICD-10-CM | POA: Insufficient documentation

## 2020-04-26 DIAGNOSIS — C787 Secondary malignant neoplasm of liver and intrahepatic bile duct: Secondary | ICD-10-CM | POA: Diagnosis present

## 2020-04-26 DIAGNOSIS — G629 Polyneuropathy, unspecified: Secondary | ICD-10-CM | POA: Diagnosis not present

## 2020-04-26 DIAGNOSIS — K649 Unspecified hemorrhoids: Secondary | ICD-10-CM | POA: Insufficient documentation

## 2020-04-26 DIAGNOSIS — I82402 Acute embolism and thrombosis of unspecified deep veins of left lower extremity: Secondary | ICD-10-CM | POA: Insufficient documentation

## 2020-04-26 DIAGNOSIS — Z7901 Long term (current) use of anticoagulants: Secondary | ICD-10-CM | POA: Insufficient documentation

## 2020-04-26 DIAGNOSIS — C259 Malignant neoplasm of pancreas, unspecified: Secondary | ICD-10-CM

## 2020-04-26 DIAGNOSIS — R140 Abdominal distension (gaseous): Secondary | ICD-10-CM | POA: Diagnosis not present

## 2020-04-26 DIAGNOSIS — Z95828 Presence of other vascular implants and grafts: Secondary | ICD-10-CM

## 2020-04-26 DIAGNOSIS — K5900 Constipation, unspecified: Secondary | ICD-10-CM | POA: Diagnosis not present

## 2020-04-26 DIAGNOSIS — R197 Diarrhea, unspecified: Secondary | ICD-10-CM | POA: Diagnosis not present

## 2020-04-26 DIAGNOSIS — H8109 Meniere's disease, unspecified ear: Secondary | ICD-10-CM | POA: Insufficient documentation

## 2020-04-26 DIAGNOSIS — C251 Malignant neoplasm of body of pancreas: Secondary | ICD-10-CM

## 2020-04-26 DIAGNOSIS — R770 Abnormality of albumin: Secondary | ICD-10-CM | POA: Diagnosis not present

## 2020-04-26 DIAGNOSIS — M7989 Other specified soft tissue disorders: Secondary | ICD-10-CM | POA: Insufficient documentation

## 2020-04-26 LAB — CBC WITH DIFFERENTIAL/PLATELET
Abs Immature Granulocytes: 0.08 10*3/uL — ABNORMAL HIGH (ref 0.00–0.07)
Basophils Absolute: 0 10*3/uL (ref 0.0–0.1)
Basophils Relative: 0 %
Eosinophils Absolute: 0 10*3/uL (ref 0.0–0.5)
Eosinophils Relative: 0 %
HCT: 33 % — ABNORMAL LOW (ref 36.0–46.0)
Hemoglobin: 10.6 g/dL — ABNORMAL LOW (ref 12.0–15.0)
Immature Granulocytes: 1 %
Lymphocytes Relative: 4 %
Lymphs Abs: 0.6 10*3/uL — ABNORMAL LOW (ref 0.7–4.0)
MCH: 31.2 pg (ref 26.0–34.0)
MCHC: 32.1 g/dL (ref 30.0–36.0)
MCV: 97.1 fL (ref 80.0–100.0)
Monocytes Absolute: 0.8 10*3/uL (ref 0.1–1.0)
Monocytes Relative: 6 %
Neutro Abs: 12.3 10*3/uL — ABNORMAL HIGH (ref 1.7–7.7)
Neutrophils Relative %: 89 %
Platelets: 226 10*3/uL (ref 150–400)
RBC: 3.4 MIL/uL — ABNORMAL LOW (ref 3.87–5.11)
RDW: 16.6 % — ABNORMAL HIGH (ref 11.5–15.5)
WBC: 13.8 10*3/uL — ABNORMAL HIGH (ref 4.0–10.5)
nRBC: 0 % (ref 0.0–0.2)

## 2020-04-26 LAB — COMPREHENSIVE METABOLIC PANEL
ALT: 36 U/L (ref 0–44)
AST: 74 U/L — ABNORMAL HIGH (ref 15–41)
Albumin: 2.4 g/dL — ABNORMAL LOW (ref 3.5–5.0)
Alkaline Phosphatase: 415 U/L — ABNORMAL HIGH (ref 38–126)
Anion gap: 9 (ref 5–15)
BUN: 21 mg/dL — ABNORMAL HIGH (ref 6–20)
CO2: 24 mmol/L (ref 22–32)
Calcium: 8.8 mg/dL — ABNORMAL LOW (ref 8.9–10.3)
Chloride: 104 mmol/L (ref 98–111)
Creatinine, Ser: 0.83 mg/dL (ref 0.44–1.00)
GFR calc Af Amer: >60 mL/min (ref >60)
GFR calc non Af Amer: >60 mL/min (ref >60)
Glucose, Bld: 154 mg/dL — ABNORMAL HIGH (ref 70–99)
Potassium: 3.3 mmol/L — ABNORMAL LOW (ref 3.5–5.1)
Sodium: 137 mmol/L (ref 135–145)
Total Bilirubin: 2 mg/dL — ABNORMAL HIGH (ref 0.3–1.2)
Total Protein: 6.7 g/dL (ref 6.5–8.1)

## 2020-04-26 MED ORDER — PANCRELIPASE (LIP-PROT-AMYL) 36000-114000 UNITS PO CPEP: ORAL_CAPSULE | ORAL | 0 refills | Status: AC

## 2020-04-26 MED ORDER — HEPARIN SOD (PORK) LOCK FLUSH 100 UNIT/ML IV SOLN
500.0000 [IU] | Freq: Once | INTRAVENOUS | Status: AC
  Administered 2020-04-26: 500 [IU]
  Filled 2020-04-26: qty 5

## 2020-04-26 MED ORDER — SODIUM CHLORIDE 0.9% FLUSH
10.0000 mL | Freq: Once | INTRAVENOUS | Status: AC
  Administered 2020-04-26: 10 mL
  Filled 2020-04-26: qty 10

## 2020-04-27 ENCOUNTER — Encounter: Payer: Self-pay | Admitting: Hematology

## 2020-04-30 ENCOUNTER — Telehealth: Payer: Self-pay | Admitting: Hematology

## 2020-04-30 ENCOUNTER — Other Ambulatory Visit: Payer: Self-pay | Admitting: Hematology

## 2020-04-30 DIAGNOSIS — C251 Malignant neoplasm of body of pancreas: Secondary | ICD-10-CM

## 2020-04-30 MED ORDER — TRAMADOL HCL 50 MG PO TABS
50.0000 mg | ORAL_TABLET | Freq: Four times a day (QID) | ORAL | 0 refills | Status: AC | PRN
Start: 2020-04-30 — End: ?

## 2020-04-30 MED ORDER — DEXAMETHASONE 4 MG PO TABS: 4.0000 mg | ORAL_TABLET | Freq: Two times a day (BID) | ORAL | 0 refills | Status: AC

## 2020-04-30 NOTE — Telephone Encounter (Signed)
Scheduled appointment per 9/3 los. Patient is aware of appointment date and time. Patient is also aware visit is a virtual appointment

## 2020-05-08 NOTE — Progress Notes (Signed)
[place]   [phone]) [phone] [phone]) [phone]   Clinic Follow up Note   Patient Care Team: [name], MD as PCP - General (Family Medicine) [name], MD as Consulting Physician (Otolaryngology) [name], MD as Consulting Physician (Oncology)   I connected with Morgan Dawson on 05/10/2020 at  4:00 PM EDT by video enabled telemedicine visit and verified that I am speaking with the correct person using two identifiers.  I discussed the limitations, risks, security and privacy concerns of performing an evaluation and management service by telephone and the availability of in person appointments. I also discussed with the patient that there may be a patient responsible charge related to this service. The patient expressed understanding and agreed to proceed.   Other persons participating in the visit and their role in the encounter:  Her husband   Patient's location:  Her home  Provider's location:  My Office   CHIEF COMPLAINT: F/u of pancreatic cancer  SUMMARY OF ONCOLOGIC HISTORY: Oncology History Overview Note  Cancer Staging Pancreatic cancer [facility]) Staging form: Exocrine Pancreas, AJCC 8th Edition - Clinical stage from 08/06/2017: Stage IV (cTX, cN0, pM1) - Signed by [name], MD on 08/12/2017     Pancreatic cancer ([place])  07/23/2017 Imaging   US abdomen limited RUQ 07/23/17 IMPRESSION: 1. Cholelithiasis.  No secondary signs of acute cholecystitis. 2. Multiple solid liver masses measuring up to 6.5 cm in the left lobe of the liver, evaluation for metastatic disease is recommended. These results will be called to the ordering clinician or representative by the Radiologist Assistant, and communication documented in the PACS or zVision Dashboard.   07/23/2017 Imaging   CT Abdomen W Contrast 07/23/17 IMPRESSION: 1. Widespread metastatic disease throughout the liver. No clear primary malignancy identified in the abdomen. The pelvis was  not imaged. Tissue sampling recommended. 2. Probable adenopathy superior to the pancreatic tail. No evidence of pancreatic mass. 3. Suspected incidental hemangioma inferiorly in the right hepatic lobe. 4. Nonspecific nodularity in the breasts. The patient has undergone recent (03/25/2017 and 04/01/2017) mammography and ultrasound.   07/29/2017 Initial Diagnosis   Metastasis to liver of unknown origin ([place])   07/29/2017 PET scan   PET 07/29/17  IMPRESSION: 1. Numerous bulky liver masses are hypermetabolic compatible with malignancy. 2. Accentuated activity within or along the pancreatic tail, likely represent a primary pancreatic tumor. Consider pancreatic protocol MRI to further work this up. 3. The peripancreatic lymph node shown above the pancreatic tail is mildly hypermetabolic favoring malignancy. 4.  Prominent stool throughout the colon favors constipation. 5. 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   08/05/2017 Pathology Results   Liver Biopsy  Diagnosis 08/05/17 Liver, needle/core biopsy - CARCINOMA. - SEE COMMENT. Microscopic Comment The malignant cells are positive for cytokeratin 7. They are negative for arginase, CDX2, cytokeratin 20, estrogen receptor, GATA-3, GCDFP, Glypican 3, Hep Par 1, Napsin A, and TTF-1. This immunohistochemical is nonspecific. Possibly primary sources include pancreatobiliary and upper gastrointestinal. Radiologic correlation is necessary. Of note, organ specific markers (GATA-3, GCDFP-breast, TTF-1, Napsin A-lung, and CDX2-colon) are negative. (JBK:ecj 08/09/2017)   08/21/2017 - 02/10/2018 Chemotherapy   FOLFIRINOX every 2 weeks starting 08/21/17. Dose reduced due to neuropahty and cytopenia    10/14/2017 Imaging   CT CAP WO Contrast 10/14/17 IMPRESSION: Evidence of known numerous liver metastases without significant interval change. No other evidence of metastatic disease within the chest, abdomen or pelvis. Cholelithiasis. Tiny  pericardial effusion.   12/02/2017 Genetic Testing   Negative for  pathogenic mutation.  The genes analyzed were the 83 genes on [facility]'s Multi-Cancer panel (ALK, APC, ATM, AXIN2, BAP1, BARD1, BLM, BMPR1A, BRCA1, BRCA2, BRIP1, CASR, CDC73, CDH1, CDK4, CDKN1B, CDKN1C, CDKN2A, CEBPA, CHEK2, CTNNA1, DICER1, DIS3L2, EGFR, EPCAM, FH, FLCN, GATA2, GPC3, GREM1, HOXB13, HRAS, KIT, MAX, MEN1, MET, MITF, MLH1, MSH2, MSH3, MSH6, MUTYH, NBN, NF1, NF2, NTHL1, PALB2, PDGFRA, PHOX2B, PMS2, POLD1, POLE, POT1, PRKAR1A, PTCH1, PTEN, RAD50, RAD51C, RAD51D, RB1, RECQL4, RET, RUNX1, SDHA, SDHAF2, SDHB, SDHC, SDHD, SMAD4, SMARCA4, SMARCB1, SMARCE1, STK11, SUFU, TERC, TERT, TMEM127, TP53, TSC1, TSC2, VHL, WRN, WT1).   12/17/2017 PET scan   IMPRESSION: 1. Although the liver metastases are still visible on the CT images, they are significantly smaller and retain no abnormal metabolic activity, consistent with response to therapy. 2. No abnormal activity within the pancreas. 3. No disease progression identified. 4. Cholelithiasis.   02/21/2018 PET scan   02/21/2018 PET Scan  IMPRESSION: 1. There are 2 new small nodules identified within the right lung which measure up to 5 mm. These are too small to reliably characterize by PET-CT, but warrant close interval follow-up. 2. Again noted are multifocal liver metastasis. The target lesion within the left lobe is slightly decreased in size when compared with the previous exam. Similar to previous exam there is no abnormal hypermetabolism above background liver activity identified within the liver lesions. 3. Gallstones.   02/23/2018 - 09/04/2018 Chemotherapy   5-FU pump infusion and liposomal irinotecan, every 2 weeks.  First cycle dose reduced due to cytopenia in the travel. Stopped on 08/25/2018 due to disease progression.    05/17/2018 Imaging   05/17/2018 PET Scan IMPRESSION: 1. Mixed response. The 2 small right lung nodules have decreased in size in the interval. Single  focus of increased uptake within the liver is new from 02/21/2018 and concerning for recurrent metabolically active liver metastasis. 2. Splenomegaly.  New from previous exam. 3. Diffusely increased bone marrow activity, likely reflecting treatment related changes.   09/01/2018 PET scan   PET 09/01/18 IMPRESSION: 1. Unfortunately there recurrence of hepatic metastasis with multiple new hypermetabolic lesions in LEFT and RIGHT hepatic lobe. 2. New extra hepatic site of malignancy which appears associated the mid pancreas. Difficult to define lesion on noncontrast exam. 3. Diffuse marrow activity is favored benign. 4. No evidence of pulmonary metastasis.   09/12/2018 - 03/27/2019 Chemotherapy   second-line Gemcitabine and Abraxane for 2 weeks on, 1 week off starting 09/12/2018. Due to neuropathy, I will start her with dose reduced Abraxane. Stopped after 03/27/19 due to disease progression.    12/14/2018 Imaging   CT CAP  IMPRESSION: CT demonstrates acute cholecystitis, with choledocholithiasis of the cystic duct.  These results were discussed by telephone at the time of interpretation on 12/14/2018 at 5:05 pm with Dr. Burr Medico.  Redemonstration of known liver metastases, with positive response to therapy, interval reduction in size of all lesions, with multiple demonstrating significant internal necrosis.  No acute finding of the chest.  Ancillary findings as above.   03/21/2019 Imaging   CT CAP W Contrast IMPRESSION: 1. Numerous rim enhancing liver metastases show overall trend towards mild progression although 1 of the index lesions is stable in the interval. 2. Interval development of portocaval lymphadenopathy concerning for disease progression. 3. Slight progression of main duct dilatation in the pancreas with abrupt cut off in the region of the body. 4. Interval development of a 9 mm subpleural nodule at the right base along the posterior hemidiaphragm. Close attention on  follow-up recommended  as metastatic disease not excluded. 5. New splenomegaly. 6. Cholelithiasis with diffuse gallbladder wall thickening. Gallbladder wall thickening is decreased in the interval. Stones and sludge noted in the gallbladder lumen.   04/09/2019 Imaging   CT AP W Contrast 04/09/19  IMPRESSION: 1. Multiple gallstones in lumen gallbladder without evidence acute cholecystitis. No significant change from prior. No biliary duct dilatation. Common bile duct normal. 2. Widespread hepatic metastasis unchanged from comparison CT. 3. Chronic pancreatic duct dilatation in the body and tail unchanged. 4. No bowel obstruction. 5. Small amount of intraperitoneal free fluid unchanged.   04/10/2019 - 03/15/2020 Chemotherapy   Third-line GTX with Xeloda 1015m BID day 1-14, off for 1 week with Docetaxel 339mm2 and Gemcitabine 75077m2 on day 4 and day 11 every 3 weeks starting oral chemo on 04/10/19. Starting 07/06/19 she will reduce Xeloda to 1000m76m the AM and 500mg18mthe PM due to low blood counts. D/c 03/15/20 due to disease progression in liver. Last dose taken 03/09/20.   07/03/2019 Imaging   CT CAP W Contrast  IMPRESSION: 1. Overall stable diffuse hepatic metastatic disease. No new/progressive findings. 2. Stable to slightly larger periportal lymph node. 3. Stable CT appearance of the pancreas. Abrupt cut off of a dilated main pancreatic duct in the body/head junction region without obvious pancreatic lesion. 4. No pulmonary nodules are identified at the lung bases. 5. Stable mild splenomegaly. 6. No new/acute findings.     10/17/2019 Imaging   CT CAP W Contrast  IMPRESSION: 1. Overall mild progression of metastatic disease. Portacaval lymphadenopathy has increased. Enlarging lesion along the posterior right diaphragm is suspicious for growing right pleural metastasis. 2. Bulky liver metastatic disease is relatively stable with mild mixed changes as detailed. 3. Pancreatic  body tumor is stable. 4. Moderate splenomegaly, increased. 5. Aortic Atherosclerosis (ICD10-I70.0). Additional chronic findings as detailed.       01/09/2020 Imaging   CT CHEST/ABD/PEL IMPRESSION 1. No specific evidence of metastatic disease within the chest. 2. New small right pleural effusion. 1. Relatively similar size of pancreatic primary. 2. Although direct comparison of liver metastasis is challenging secondary to differences in bolus timing, mild progression is identified. 3. Mild progression of abdominal adenopathy. 4. Persistent hepatosplenomegaly. 5. Trace perihepatic ascites, similar. 6. Borderline small bowel dilatation, without focal transition point. Ileus versus low-grade partial small bowel obstruction. 7. Cholelithiasis.   01/21/2020 Imaging   CT AP  IMPRESSION: 1. No significant change of pancreatic fullness/mass, innumerable hepatic masses, abdominal lymphadenopathy and small amount of ascites compatible with metastatic disease. 2. Unchanged splenomegaly. 3. Decreased small RIGHT pleural effusion. Bibasilar atelectasis again noted. 4. Aortic Atherosclerosis (ICD10-I70.0).     03/13/2020 Imaging   CT CAP W contrast  IMPRESSION: 1. Similar size of pancreatic head/neck mass. 2. Mild interval increase in size of some of the hepatic metastasis. 3. Prominent loops of small bowel within the central and lower abdomen without discrete transition point identified. Findings may represent ileus. Additionally, there is mild wall thickening of a proximal loop of small bowel which is nonspecific. Findings may represent enteritis. 4. Wall thickening of the ascending colon and proximal transverse colon as well as descending colon and rectum raising the possibility of colitis. 5. Small volume perihepatic ascites. 6. Mildly progressed abdominal adenopathy. 7. Possible 11 mm mass within the right breast. Recommend correlation with mammography if clinically indicated  given clinical condition. 8. Aortic atherosclerosis. 9. These results will be called to the ordering clinician or representative by the Radiologist Assistant, and communication  documented in the PACS or [employer].     Pancreatic cancer metastasized to liver [facility])  08/11/2017 Initial Diagnosis   Pancreatic cancer metastasized to liver [facility])      CURRENT THERAPY:  Supportive and Hospice Care   INTERVAL HISTORY:  Morgan Dawson is here for a follow up. They identified themselves by face to face Zoom Video. She has started Hospice home care this week and satisfied so far. She has gone over her medication list with them and only her specific hydrocortisone cream for her hemorrhoids is not covered by them, but they are looking into it. She notes her Meniere's is flaring today. For pain she has tramadol but only has occasionally pain with 2/10 constant pain. She is still on Xarelto. She continues to have abdominal changes due to her cancer. Her hospice nurse sees her 3 times a week and asked a CNA sees her at least once a week.    REVIEW OF SYSTEMS:   Constitutional: Denies fevers, chills or abnormal weight loss Eyes: Denies blurriness of vision Ears, nose, mouth, throat, and face: Denies mucositis or sore throat Respiratory: Denies cough, dyspnea or wheezes Cardiovascular: Denies palpitation, chest discomfort or lower extremity swelling Gastrointestinal:  Denies nausea, heartburn or change in bowel habits (+) Intermittent pain  Skin: Denies abnormal skin rashes Lymphatics: Denies new lymphadenopathy or easy bruising Neurological:Denies numbness, tingling or new weaknesses Behavioral/Psych: Mood is stable, no new changes  All other systems were reviewed with the patient and are negative.  MEDICAL HISTORY:  Past Medical History:  Diagnosis Date  . Diarrhea of presumed infectious origin 01/12/2018  . Gastroenteritis 01/12/2018  . Genetic testing 12/02/2017   Multi-Cancer panel  (83 genes) @ [facility] - No pathogenic mutations detected  . Meniere disease 2016  . met pancreatic ca to liver dx'd 07/2017  . Pancreatitis     SURGICAL HISTORY: Past Surgical History:  Procedure Laterality Date  . CERVICAL ABLATION    . ENDOMETRIAL ABLATION  2011  . IR CHOLANGIOGRAM EXISTING TUBE  01/09/2019  . IR FLUORO GUIDE PORT INSERTION RIGHT  08/12/2017  . IR PERC CHOLECYSTOSTOMY  12/15/2018  . IR US GUIDE VASC ACCESS RIGHT  08/12/2017  . [place]    I have reviewed the social history and family history with the patient and they are unchanged from previous note.  ALLERGIES:  has No Known Allergies.  MEDICATIONS:  Current Outpatient Medications  Medication Sig Dispense Refill  . b complex vitamins tablet Take 1 tablet by mouth daily.    . Cholecalciferol (VITAMIN D3 GUMMIES ADULT) 25 MCG (1000 UT) CHEW Chew 2,000 Units by mouth daily.     . ciprofloxacin (CILOXAN) 0.3 % ophthalmic solution Place 1 drop into both eyes every 4 (four) hours while awake. Administer 1 drop, every 2 hours, while awake, for 2 days. Then 1 drop, every 4 hours, while awake, for the next 5 days. 5 mL 0  . cyclobenzaprine (FLEXERIL) 5 MG tablet Take 1 tablet (5 mg total) by mouth 3 (three) times daily as needed for muscle spasms. 30 tablet 0  . dexamethasone (DECADRON) 4 MG tablet Take 1 tablet (4 mg total) by mouth 2 (two) times daily with a meal. 20 tablet 0  . gabapentin (NEURONTIN) 100 MG capsule Take 1 capsule (100 mg total) by mouth 3 (three) times daily. (Patient taking differently: Take 100 mg by mouth in the morning and at bedtime. ) 270 capsule 2  . hydrocortisone (ANUSOL-HC) 2.5 %  rectal cream Place 1 application rectally 2 (two) times daily. 60 g 1  . hydrocortisone (ANUSOL-HC) 25 MG suppository Place 1 suppository (25 mg total) rectally 2 (two) times daily. 12 suppository 2  . lidocaine (XYLOCAINE) 2 % solution Apply small amount to rectum PRN pain 100 mL 1  . lidocaine-prilocaine  (EMLA) cream Apply 1 application topically as needed. 30 g 2  . lipase/protease/amylase (CREON) 36000 UNITS CPEP capsule Take 1 capsule (36,000 Units total) by mouth 3 (three) times daily with meals AND 2 capsules (72,000 Units total) 3 (three) times daily with meals. Take 1 capsule with snack. 180 capsule 0  . loperamide (IMODIUM) 2 MG capsule Take 1 capsule (2 mg total) by mouth as needed for diarrhea or loose stools. 30 capsule 0  . loratadine (CLARITIN) 10 MG tablet Take 10 mg by mouth daily as needed for allergies.    . magic mouthwash w/lidocaine SOLN Take 10 mLs by mouth 3 (three) times daily as needed for mouth pain. Swish and spit 10 ML by mouth 3 times daily as needed for mouth pain. 240 mL 1  . meclizine (ANTIVERT) 25 MG tablet Take 25 mg by mouth 3 (three) times daily as needed for dizziness.    . ondansetron (ZOFRAN-ODT) 8 MG disintegrating tablet TAKE 1 TABLET BY MOUTH EVERY 8 HOURS AS NEEDED FOR NAUSE OR VOMITING (Patient taking differently: Take 8 mg by mouth every 8 (eight) hours as needed for nausea or vomiting. ) 40 tablet 1  . potassium chloride (KLOR-CON) 10 MEQ tablet Take 1 tablet (10 mEq total) by mouth 4 (four) times daily. 360 tablet 2  . prochlorperazine (COMPAZINE) 10 MG tablet Take 1 tablet (10 mg total) by mouth every 6 (six) hours as needed for nausea or vomiting. 40 tablet 2  . PROMACTA 50 MG tablet TAKE 1 TABLET BY MOUTH 1 TIME A DAY. (Patient not taking: Reported on 04/11/2020) 30 tablet 1  . traMADol (ULTRAM) 50 MG tablet Take 1 tablet (50 mg total) by mouth every 6 (six) hours as needed for moderate pain or severe pain. 60 tablet 0  . XARELTO 15 MG TABS tablet TAKE 1 TABLET BY MOUTH EVERY DAY 90 tablet 1   No current facility-administered medications for this visit.   Facility-Administered Medications Ordered in Other Visits  Medication Dose Route Frequency Provider Last Rate Last Admin  . sodium chloride flush (NS) 0.9 % injection 10 mL  10 mL Intracatheter Once  [name], MD        PHYSICAL EXAMINATION: ECOG PERFORMANCE STATUS: 3 - Symptomatic, >50% confined to bed  No vitals taken today, Exam not performed today   LABORATORY DATA:  I have reviewed the data as listed CBC Latest Ref Rng & Units 04/26/2020 04/11/2020 03/06/2020  WBC 4.0 - 10.5 K/uL 13.8(H) 10.4 3.3(L)  Hemoglobin 12.0 - 15.0 g/dL 10.6(L) 9.8(L) 8.1(L)  Hematocrit 36 - 46 % 33.0(L) 30.7(L) 25.0(L)  Platelets 150 - 400 K/uL 226 258 248     CMP Latest Ref Rng & Units 04/26/2020 04/11/2020 03/06/2020  Glucose 70 - 99 mg/dL 154(H) 113(H) 138(H)  BUN 6 - 20 mg/dL 21(H) 15 14  Creatinine 0.44 - 1.00 mg/dL 0.83 0.90 0.84  Sodium 135 - 145 mmol/L 137 134(L) 140  Potassium 3.5 - 5.1 mmol/L 3.3(L) 3.3(L) 3.5  Chloride 98 - 111 mmol/L 104 101 105  CO2 22 - 32 mmol/L 24 23 26   Calcium 8.9 - 10.3 mg/dL 8.8(L) 12.8(H) 9.8  Total Protein 6.5 -  8.1 g/dL 6.7 7.2 6.8  Total Bilirubin 0.3 - 1.2 mg/dL 2.0(H) 1.1 0.8  Alkaline Phos 38 - 126 U/L 415(H) 387(H) 244(H)  AST 15 - 41 U/L 74(H) 70(H) 52(H)  ALT 0 - 44 U/L 36 24 23      RADIOGRAPHIC STUDIES: I have personally reviewed the radiological images as listed and agreed with the findings in the report. No results found.   ASSESSMENT & PLAN:  Morgan Dawson is a 59 y.o. female with    1. 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 She subsequently progressed through multiple lines treatments.  -She previously declined clinical trial at [facility].  -Due to her significant neuropathy, I do not think she can tolerate oxaliplatin based therapy.I do not have more standard treatment options for her. We previously had long discussion about her aggressive cancer, poor prognosis and goal of care. She has agreed to Hospice home care and I will  continue to oversee her heath care.  -Given her 03/2020 Hypercalcemia, likely related to her cancer, she received 1 dose Zometa on 04/12/20 with good response.  -The patient understands the goal of care is palliative. She has agreed to DNR/DNI with living will in place.  -She has enrolled to Hospice home care early this week. We reviewed symptoms management. F/u as needed in the future.   2. Supportive Care: Malnutrition, Diarrhea/constipation, Bloating, Peripheral neuropathy, Hemorrhoids -Secondaryto chemo and underlying metastatic cancer. Mostly managed by Hospice home care.  -On Creon, nutritional supplements and Neurontin 200 mg at night.  -She has ankle swelling due to low protein/albumin. I recommend she use tighter compression socks and elevate her legs. She can also use protein powder or shakes, increase water intake and ambulate with light exercise more.  -For pain she is on Tramadol currently as needed. Her pain is a constant 2/10 with occasional flares.  -She continue her other medications as needed. She has nurse and CNA visits 3-4 days a week.   3. Meniere's Disease/Vertigo  -Intermittently chronic with more frequent and intense episodes lately. Continue OTC Meclizine. She can f/u with ENT if needed.   4. LE DVT, Dx 12/15/18 -previously on low-doseXarelto44m daily. -due to significant bruises, I reduced to 7.555mdaily   5.H/o Acute cholecystitis, status post cholecystectomy tube placement 4/23/20per IR, no surgery offered.    Plan -Continue Hospice home care  -F/u as needed in the future.    No problem-specific Assessment & Plan notes found for this encounter.   No orders of the defined types were placed in this encounter.  I discussed the assessment and treatment plan with the patient. The patient was provided an opportunity to ask questions and all were answered. The patient agreed with the plan and demonstrated an understanding of the instructions.  The  patient was advised to call back or seek an in-person evaluation if the symptoms worsen or if the condition fails to improve as anticipated.  The total time spent in the appointment was 18 minutes.    YaTruitt MerleMD 05/10/2020   I, AmJoslyn Devonam acting as scribe for YaTruitt MerleMD.   I have reviewed the above documentation for accuracy and completeness, and I agree with the above.

## 2020-05-09 ENCOUNTER — Telehealth: Payer: Self-pay | Admitting: Hematology

## 2020-05-09 NOTE — Telephone Encounter (Signed)
Contacted patient to verify mychart video visit for pre reg 

## 2020-05-10 ENCOUNTER — Encounter: Payer: Self-pay | Admitting: Hematology

## 2020-05-10 ENCOUNTER — Inpatient Hospital Stay (HOSPITAL_BASED_OUTPATIENT_CLINIC_OR_DEPARTMENT_OTHER): Payer: BLUE CROSS/BLUE SHIELD | Admitting: Hematology

## 2020-05-10 ENCOUNTER — Telehealth: Payer: Self-pay | Admitting: Hematology

## 2020-05-10 ENCOUNTER — Ambulatory Visit: Payer: BLUE CROSS/BLUE SHIELD | Admitting: Hematology

## 2020-05-10 DIAGNOSIS — C787 Secondary malignant neoplasm of liver and intrahepatic bile duct: Secondary | ICD-10-CM

## 2020-05-10 DIAGNOSIS — C259 Malignant neoplasm of pancreas, unspecified: Secondary | ICD-10-CM

## 2020-05-13 ENCOUNTER — Telehealth: Payer: Self-pay | Admitting: Hematology

## 2020-05-13 NOTE — Telephone Encounter (Signed)
F/u open per 9/17 los

## 2020-05-15 ENCOUNTER — Telehealth: Payer: Self-pay | Admitting: Nurse Practitioner

## 2020-05-15 NOTE — Telephone Encounter (Signed)
Rec'd call back from patient stating that she now has [place] services coming out since 05/08/20.  I will cancel the Palliative Referral.

## 2020-05-15 NOTE — Telephone Encounter (Signed)
Called patient to offer to schedule a Palliative Consult, no answer - left message with reason for call along with my name and contact number, requesting a return call.

## 2020-05-20 ENCOUNTER — Telehealth: Payer: Self-pay

## 2020-05-24 NOTE — Telephone Encounter (Signed)
Received notification from [facility] at [facility], [place] passed away today, 06-11-20 at 1433

## 2020-05-24 DEATH — deceased

## 2020-06-29 ENCOUNTER — Other Ambulatory Visit: Payer: Self-pay | Admitting: Hematology

## 2020-06-29 DIAGNOSIS — C787 Secondary malignant neoplasm of liver and intrahepatic bile duct: Secondary | ICD-10-CM

## 2020-06-29 DIAGNOSIS — E876 Hypokalemia: Secondary | ICD-10-CM

## 2020-06-29 DIAGNOSIS — C259 Malignant neoplasm of pancreas, unspecified: Secondary | ICD-10-CM

## 2021-04-19 ENCOUNTER — Encounter: Payer: Self-pay | Admitting: Hematology

## 2021-04-19 NOTE — Telephone Encounter (Signed)
This encounter was created in error - please disregard.
